# Patient Record
Sex: Female | Born: 1970 | Race: White | Hispanic: No | Marital: Married | Smoking: Current every day smoker
Health system: Southern US, Community
[De-identification: ages and names within clinical notes are randomized; demographics above are authoritative.]

## PROBLEM LIST (undated history)

## (undated) DIAGNOSIS — M5136 Other intervertebral disc degeneration, lumbar region: Secondary | ICD-10-CM

## (undated) DIAGNOSIS — R569 Unspecified convulsions: Secondary | ICD-10-CM

## (undated) DIAGNOSIS — T7840XA Allergy, unspecified, initial encounter: Secondary | ICD-10-CM

## (undated) DIAGNOSIS — J45909 Unspecified asthma, uncomplicated: Secondary | ICD-10-CM

## (undated) DIAGNOSIS — N83209 Unspecified ovarian cyst, unspecified side: Secondary | ICD-10-CM

## (undated) DIAGNOSIS — Z87442 Personal history of urinary calculi: Secondary | ICD-10-CM

## (undated) DIAGNOSIS — I499 Cardiac arrhythmia, unspecified: Secondary | ICD-10-CM

## (undated) DIAGNOSIS — F419 Anxiety disorder, unspecified: Secondary | ICD-10-CM

## (undated) DIAGNOSIS — M797 Fibromyalgia: Secondary | ICD-10-CM

## (undated) DIAGNOSIS — S4380XA Sprain of other specified parts of unspecified shoulder girdle, initial encounter: Secondary | ICD-10-CM

## (undated) DIAGNOSIS — M419 Scoliosis, unspecified: Secondary | ICD-10-CM

## (undated) DIAGNOSIS — M1990 Unspecified osteoarthritis, unspecified site: Secondary | ICD-10-CM

## (undated) DIAGNOSIS — R233 Spontaneous ecchymoses: Principal | ICD-10-CM

## (undated) DIAGNOSIS — M51369 Other intervertebral disc degeneration, lumbar region without mention of lumbar back pain or lower extremity pain: Secondary | ICD-10-CM

## (undated) DIAGNOSIS — R51 Headache: Secondary | ICD-10-CM

## (undated) DIAGNOSIS — C801 Malignant (primary) neoplasm, unspecified: Secondary | ICD-10-CM

## (undated) DIAGNOSIS — K219 Gastro-esophageal reflux disease without esophagitis: Secondary | ICD-10-CM

## (undated) DIAGNOSIS — G8929 Other chronic pain: Secondary | ICD-10-CM

## (undated) DIAGNOSIS — M549 Dorsalgia, unspecified: Secondary | ICD-10-CM

## (undated) HISTORY — DX: Allergy, unspecified, initial encounter: T78.40XA

## (undated) HISTORY — DX: Spontaneous ecchymoses: R23.3

## (undated) HISTORY — DX: Unspecified osteoarthritis, unspecified site: M19.90

## (undated) HISTORY — DX: Fibromyalgia: M79.7

## (undated) HISTORY — PX: NECK SURGERY: SHX720

## (undated) HISTORY — DX: Gastro-esophageal reflux disease without esophagitis: K21.9

## (undated) HISTORY — DX: Anxiety disorder, unspecified: F41.9

## (undated) HISTORY — DX: Other intervertebral disc degeneration, lumbar region: M51.36

## (undated) HISTORY — PX: OTHER SURGICAL HISTORY: SHX169

## (undated) HISTORY — DX: Other intervertebral disc degeneration, lumbar region without mention of lumbar back pain or lower extremity pain: M51.369

## (undated) HISTORY — PX: TUBAL LIGATION: SHX77

---
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
## 2002-11-29 ENCOUNTER — Encounter: Admission: RE | Admit: 2002-11-29 | Discharge: 2002-11-29 | Payer: Self-pay | Admitting: Family Medicine

## 2002-11-29 ENCOUNTER — Encounter: Payer: Self-pay | Admitting: Family Medicine

## 2007-03-24 ENCOUNTER — Other Ambulatory Visit: Admission: RE | Admit: 2007-03-24 | Discharge: 2007-03-24 | Payer: Self-pay | Admitting: Family Medicine

## 2009-04-04 ENCOUNTER — Ambulatory Visit: Payer: Self-pay | Admitting: Internal Medicine

## 2009-12-28 ENCOUNTER — Ambulatory Visit: Payer: Self-pay | Admitting: Pain Medicine

## 2010-01-10 ENCOUNTER — Ambulatory Visit: Payer: Self-pay | Admitting: Pain Medicine

## 2010-02-15 ENCOUNTER — Ambulatory Visit: Payer: Self-pay | Admitting: Pain Medicine

## 2010-02-28 ENCOUNTER — Ambulatory Visit: Payer: Self-pay | Admitting: Pain Medicine

## 2010-04-12 ENCOUNTER — Ambulatory Visit: Payer: Self-pay | Admitting: Pain Medicine

## 2010-04-16 ENCOUNTER — Ambulatory Visit: Payer: Self-pay | Admitting: Pain Medicine

## 2010-05-10 ENCOUNTER — Ambulatory Visit: Payer: Self-pay | Admitting: Pain Medicine

## 2010-06-14 ENCOUNTER — Ambulatory Visit: Payer: Self-pay | Admitting: Pain Medicine

## 2010-06-24 HISTORY — PX: ABDOMINAL HYSTERECTOMY: SHX81

## 2010-06-27 ENCOUNTER — Ambulatory Visit: Payer: Self-pay | Admitting: Pain Medicine

## 2010-07-02 ENCOUNTER — Ambulatory Visit: Payer: Self-pay | Admitting: Unknown Physician Specialty

## 2010-07-11 ENCOUNTER — Ambulatory Visit: Payer: Self-pay | Admitting: Unknown Physician Specialty

## 2010-07-14 LAB — PATHOLOGY REPORT

## 2010-07-31 ENCOUNTER — Ambulatory Visit: Payer: Self-pay | Admitting: Pain Medicine

## 2010-08-08 ENCOUNTER — Ambulatory Visit: Payer: Self-pay | Admitting: Pain Medicine

## 2010-08-20 ENCOUNTER — Emergency Department (HOSPITAL_COMMUNITY)
Admission: EM | Admit: 2010-08-20 | Discharge: 2010-08-20 | Disposition: A | Discharge status: Home / Self Care | Payer: 59 | Attending: Emergency Medicine | Admitting: Emergency Medicine

## 2010-08-20 DIAGNOSIS — R51 Headache: Secondary | ICD-10-CM | POA: Insufficient documentation

## 2010-08-20 DIAGNOSIS — H53149 Visual discomfort, unspecified: Secondary | ICD-10-CM | POA: Insufficient documentation

## 2010-08-20 DIAGNOSIS — J329 Chronic sinusitis, unspecified: Secondary | ICD-10-CM | POA: Insufficient documentation

## 2010-08-28 ENCOUNTER — Ambulatory Visit: Payer: Self-pay | Admitting: Pain Medicine

## 2010-09-05 ENCOUNTER — Ambulatory Visit: Payer: Self-pay | Admitting: Pain Medicine

## 2010-10-15 ENCOUNTER — Ambulatory Visit: Payer: Self-pay | Admitting: Pain Medicine

## 2010-10-22 ENCOUNTER — Ambulatory Visit: Payer: Self-pay | Admitting: Pain Medicine

## 2010-10-25 ENCOUNTER — Ambulatory Visit: Payer: Self-pay | Admitting: Pain Medicine

## 2010-11-27 ENCOUNTER — Ambulatory Visit: Payer: Self-pay | Admitting: Pain Medicine

## 2010-12-03 ENCOUNTER — Ambulatory Visit: Payer: Self-pay | Admitting: Pain Medicine

## 2010-12-20 ENCOUNTER — Ambulatory Visit: Payer: Self-pay | Admitting: Pain Medicine

## 2010-12-31 ENCOUNTER — Ambulatory Visit: Payer: Self-pay | Admitting: Pain Medicine

## 2011-01-16 ENCOUNTER — Ambulatory Visit: Payer: Self-pay | Admitting: Pain Medicine

## 2011-01-23 ENCOUNTER — Ambulatory Visit: Payer: Self-pay | Admitting: Pain Medicine

## 2011-02-21 ENCOUNTER — Ambulatory Visit: Payer: Self-pay | Admitting: Pain Medicine

## 2011-03-21 ENCOUNTER — Ambulatory Visit: Payer: Self-pay | Admitting: Pain Medicine

## 2011-03-25 ENCOUNTER — Ambulatory Visit: Payer: Self-pay | Admitting: Pain Medicine

## 2011-03-25 ENCOUNTER — Emergency Department (HOSPITAL_COMMUNITY)
Admission: EM | Admit: 2011-03-25 | Discharge: 2011-03-26 | Disposition: A | Discharge status: Home / Self Care | Payer: Self-pay | Attending: Emergency Medicine | Admitting: Emergency Medicine

## 2011-03-25 DIAGNOSIS — F29 Unspecified psychosis not due to a substance or known physiological condition: Secondary | ICD-10-CM | POA: Insufficient documentation

## 2011-03-25 DIAGNOSIS — R55 Syncope and collapse: Secondary | ICD-10-CM | POA: Insufficient documentation

## 2011-03-25 DIAGNOSIS — R32 Unspecified urinary incontinence: Secondary | ICD-10-CM | POA: Insufficient documentation

## 2011-03-25 DIAGNOSIS — F17200 Nicotine dependence, unspecified, uncomplicated: Secondary | ICD-10-CM | POA: Insufficient documentation

## 2011-03-25 DIAGNOSIS — R569 Unspecified convulsions: Secondary | ICD-10-CM | POA: Insufficient documentation

## 2011-03-25 DIAGNOSIS — Z79899 Other long term (current) drug therapy: Secondary | ICD-10-CM | POA: Insufficient documentation

## 2011-03-25 HISTORY — DX: Scoliosis, unspecified: M41.9

## 2011-03-25 NOTE — ED Notes (Signed)
Bystander describes pt had seizure activity at home lasting 4 minutes.   Pt awake, alert, oriented now,  Denies pain

## 2011-03-25 NOTE — ED Notes (Signed)
Alert, talking, no hx of sz.  No  Injury to tongue , no incontinence.  No recent MVC or other injury

## 2011-03-26 ENCOUNTER — Encounter (HOSPITAL_COMMUNITY): Payer: Self-pay | Admitting: *Deleted

## 2011-03-26 ENCOUNTER — Emergency Department (HOSPITAL_COMMUNITY): Payer: Self-pay

## 2011-03-26 LAB — BASIC METABOLIC PANEL
BUN: 5 mg/dL — ABNORMAL LOW (ref 6–23)
CO2: 26 mEq/L (ref 19–32)
Calcium: 8.5 mg/dL (ref 8.4–10.5)
Chloride: 104 mEq/L (ref 96–112)
GFR calc non Af Amer: >90 mL/min (ref >90)
Glucose, Bld: 101 mg/dL — ABNORMAL HIGH (ref 70–99)
Potassium: 3.7 mEq/L (ref 3.5–5.1)
Sodium: 137 mEq/L (ref 135–145)

## 2011-03-26 LAB — URINALYSIS, ROUTINE W REFLEX MICROSCOPIC
Bilirubin Urine: NEGATIVE
Glucose, UA: NEGATIVE mg/dL
Protein, ur: NEGATIVE mg/dL
Urobilinogen, UA: 0.2 mg/dL (ref 0.0–1.0)

## 2011-03-26 LAB — CBC
MCHC: 35.6 g/dL (ref 30.0–36.0)
Platelets: 169 10*3/uL (ref 150–400)
RDW: 12.5 % (ref 11.5–15.5)

## 2011-03-26 LAB — DIFFERENTIAL
Basophils Absolute: 0 10*3/uL (ref 0.0–0.1)
Basophils Relative: 0 % (ref 0–1)
Neutro Abs: 7.7 10*3/uL (ref 1.7–7.7)
Neutrophils Relative %: 88 % — ABNORMAL HIGH (ref 43–77)

## 2011-03-26 LAB — URINE MICROSCOPIC-ADD ON

## 2011-03-26 NOTE — ED Provider Notes (Signed)
History     CSN: [id number] Arrival date & time: 03/25/2011 11:19 PM  Chief Complaint  Patient presents with  . Seizures    (Consider location/radiation/quality/duration/timing/severity/associated sxs/prior treatment) HPI Comments: Seen 0013. Patient was sitting on the couch next to her husband. Her head deviated to the right and her body stiffened. Her husband prevented her from sliding off the couch. She began to foam at the mouth and had shaking of the entire body for ?4 minutes. When she awakened she was a little confused and her speech was not clear initially. Patient denies headache, aura, fever, chills. She did have a nerve block done yesterday morning at [facility] by Dr. Metta Clines. 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  Patient is a 40 y.o. female presenting with seizures. The history is provided by the patient and the spouse.  Seizures  This is a new problem. The current episode started less than 1 hour ago. The problem has been resolved. There was 1 seizure. The most recent episode lasted 2 to 5 minutes. Associated symptoms include confusion. Pertinent negatives include no sleepiness, no headaches, no speech difficulty, no visual disturbance, no neck stiffness, no sore throat, no chest pain, no cough, no nausea, no vomiting, no diarrhea and no muscle weakness. Characteristics include bowel incontinence, rhythmic jerking and loss of consciousness. Characteristics do not include eye blinking, eye deviation, bladder incontinence, bit tongue, apnea or cyanosis. The episode was witnessed. There was no sensation of an aura present. The seizures did not continue in the ED. The seizure(s) had no focality. Patient had a nerve block done yesterday for chronic pain in the neck. There has been no fever. There were no medications administered prior to arrival.    Past Medical History  Diagnosis Date  . Scoliosis     Past Surgical History  Procedure Date  .  Abdominal hysterectomy     History reviewed. No pertinent family history.  History  Substance Use Topics  . Smoking status: Current Everyday Smoker  . Smokeless tobacco: Not on file  . Alcohol Use: No    OB History    Grav Para Term Preterm Abortions TAB SAB Ect Mult Living                  Review of Systems  HENT: Negative for sore throat.   Eyes: Negative for visual disturbance.  Respiratory: Negative for apnea and cough.   Cardiovascular: Negative for chest pain and cyanosis.  Gastrointestinal: Positive for bowel incontinence. Negative for nausea, vomiting and diarrhea.  Genitourinary: Negative for bladder incontinence.  Neurological: Positive for seizures and loss of consciousness. Negative for speech difficulty and headaches.  Psychiatric/Behavioral: Positive for confusion.  All other systems reviewed and are negative.    Allergies  Review of patient's allergies indicates no known allergies.  Home Medications   Current Outpatient Rx  Name Route Sig Dispense Refill  . AMITRIPTYLINE HCL 10 MG PO TABS Oral Take 10 mg by mouth at bedtime.      . BUSPIRONE HCL 30 MG PO TABS Oral Take 15 mg by mouth 2 (two) times daily.      Marland Kitchen CITALOPRAM HYDROBROMIDE 40 MG PO TABS Oral Take 40 mg by mouth daily.      Marland Kitchen GABAPENTIN 300 MG PO CAPS Oral Take 300 mg by mouth 3 (three) times daily.      . TRAMADOL HCL 50 MG PO TABS Oral Take 50 mg by mouth every  6 (six) hours as needed.        BP 115/69  Pulse 85  Temp(Src) 98.4 F (36.9 C) (Oral)  Resp 18  Ht 5\' 1"  (1.549 m)  Wt 100 lb (45.36 kg)  BMI 18.89 kg/m2  SpO2 95%  Physical Exam  Nursing note and vitals reviewed. Constitutional: She is oriented to person, place, and time. She appears well-developed and well-nourished.  HENT:  Head: Normocephalic and atraumatic.  Right Ear: External ear normal.  Left Ear: External ear normal.  Mouth/Throat: Oropharynx is clear and moist.  Eyes: EOM are normal. Pupils are equal, round,  and reactive to light.  Neck: Normal range of motion. Neck supple.  Cardiovascular: Normal rate, normal heart sounds and intact distal pulses.   Pulmonary/Chest: Effort normal and breath sounds normal.  Abdominal: Soft. Bowel sounds are normal.  Musculoskeletal: Normal range of motion.  Neurological: She is alert and oriented to person, place, and time. She has normal reflexes. No cranial nerve deficit. Coordination normal.       Speech normal.No focal neurological deficits.  Skin: Skin is warm and dry.    ED Course  Procedures (including critical care time)  Labs Reviewed  CBC - Abnormal; Notable for the following:    RBC 5.28 (*)    Hemoglobin 17.1 (*)    HCT 48.0 (*)    All other components within normal limits  DIFFERENTIAL - Abnormal; Notable for the following:    Neutrophils Relative 88 (*)    Lymphocytes Relative 6 (*)    Lymphs Abs 0.6 (*)    All other components within normal limits  BASIC METABOLIC PANEL - Abnormal; Notable for the following:    Glucose, Bld 101 (*)    BUN 5 (*)    All other components within normal limits  URINALYSIS, ROUTINE W REFLEX MICROSCOPIC - Abnormal; Notable for the following:    Color, Urine STRAW (*)    Hgb urine dipstick TRACE (*)    All other components within normal limits  URINE MICROSCOPIC-ADD ON - Abnormal; Notable for the following:    Squamous Epithelial / LPF MANY (*)    All other components within normal limits   Ct Head Wo Contrast  03/26/2011  *RADIOLOGY REPORT*  Clinical Data: Seizures.  Migraine today.  CT HEAD WITHOUT CONTRAST  Technique:  Contiguous axial images were obtained from the base of the skull through the vertex without contrast.  Comparison: None.  Findings: The ventricles and sulci appear symmetrical.  No mass effect or midline shift.  No abnormal extra-axial fluid collections.  Gray-white matter junctions are distinct.  Basal cisterns are not effaced.  No evidence of acute intracranial hemorrhage.  No depressed  skull fractures.  Visualized paranasal sinuses are not opacified.  IMPRESSION: No evidence of acute intracranial hemorrhage, mass lesion, or acute infarct.  Original Report Authenticated By: Marlon Pel, M.D.    Patient with first time seizure. Labs unremarkable CT head normal. Discussed with patient and husband no intervention for first seizure. Will give referral to neurology. Advised that should she develop further seizures, medication may be started.Pt stable in ED with no significant deterioration in condition. MDM Reviewed: nursing note and vitals Interpretation: labs and CT scan           Nicoletta Dress. Colon Branch, MD 03/26/11 [phone]

## 2011-03-26 NOTE — ED Notes (Signed)
Sleeping, family at bedside 

## 2011-03-26 NOTE — ED Notes (Signed)
Saline lock had been in lt ac placed by EMS.  REmoved with cath intact.

## 2011-04-09 ENCOUNTER — Other Ambulatory Visit: Payer: Self-pay | Admitting: Neurology

## 2011-04-09 DIAGNOSIS — R569 Unspecified convulsions: Secondary | ICD-10-CM

## 2011-04-09 DIAGNOSIS — Z8669 Personal history of other diseases of the nervous system and sense organs: Secondary | ICD-10-CM

## 2011-04-18 ENCOUNTER — Ambulatory Visit: Payer: Self-pay | Admitting: Pain Medicine

## 2011-04-19 ENCOUNTER — Ambulatory Visit (HOSPITAL_COMMUNITY)
Admission: RE | Admit: 2011-04-19 | Discharge: 2011-04-19 | Disposition: A | Discharge status: Home / Self Care | Payer: 59 | Source: Ambulatory Visit | Attending: Neurology | Admitting: Neurology

## 2011-04-19 DIAGNOSIS — Z8669 Personal history of other diseases of the nervous system and sense organs: Secondary | ICD-10-CM

## 2011-04-19 DIAGNOSIS — Q8500 Neurofibromatosis, unspecified: Secondary | ICD-10-CM

## 2011-04-19 DIAGNOSIS — G9389 Other specified disorders of brain: Secondary | ICD-10-CM | POA: Insufficient documentation

## 2011-04-19 DIAGNOSIS — R569 Unspecified convulsions: Secondary | ICD-10-CM

## 2011-04-19 MED ORDER — GADOBENATE DIMEGLUMINE 529 MG/ML IV SOLN
10.0000 mL | Freq: Once | INTRAVENOUS | Status: AC | PRN
  Administered 2011-04-19: 10 mL via INTRAVENOUS

## 2011-05-21 ENCOUNTER — Ambulatory Visit: Payer: Self-pay | Admitting: Pain Medicine

## 2011-06-10 ENCOUNTER — Ambulatory Visit: Payer: Self-pay | Admitting: Pain Medicine

## 2011-06-21 ENCOUNTER — Encounter (HOSPITAL_COMMUNITY): Payer: Self-pay | Admitting: *Deleted

## 2011-06-21 ENCOUNTER — Emergency Department (HOSPITAL_COMMUNITY)
Admission: EM | Admit: 2011-06-21 | Discharge: 2011-06-22 | Disposition: A | Discharge status: Home / Self Care | Payer: 59 | Attending: Emergency Medicine | Admitting: Emergency Medicine

## 2011-06-21 DIAGNOSIS — N83209 Unspecified ovarian cyst, unspecified side: Secondary | ICD-10-CM

## 2011-06-21 DIAGNOSIS — M25559 Pain in unspecified hip: Secondary | ICD-10-CM | POA: Insufficient documentation

## 2011-06-21 DIAGNOSIS — Z79899 Other long term (current) drug therapy: Secondary | ICD-10-CM | POA: Insufficient documentation

## 2011-06-21 DIAGNOSIS — G40909 Epilepsy, unspecified, not intractable, without status epilepticus: Secondary | ICD-10-CM | POA: Insufficient documentation

## 2011-06-21 DIAGNOSIS — N949 Unspecified condition associated with female genital organs and menstrual cycle: Secondary | ICD-10-CM | POA: Insufficient documentation

## 2011-06-21 DIAGNOSIS — R300 Dysuria: Secondary | ICD-10-CM | POA: Insufficient documentation

## 2011-06-21 DIAGNOSIS — M549 Dorsalgia, unspecified: Secondary | ICD-10-CM | POA: Insufficient documentation

## 2011-06-21 DIAGNOSIS — G8929 Other chronic pain: Secondary | ICD-10-CM | POA: Insufficient documentation

## 2011-06-21 HISTORY — DX: Unspecified convulsions: R56.9

## 2011-06-21 HISTORY — DX: Other chronic pain: G89.29

## 2011-06-21 HISTORY — DX: Dorsalgia, unspecified: M54.9

## 2011-06-21 NOTE — ED Notes (Signed)
Pt has had LLQ pain that started tonight.  Urination decreased, reports pain with urination.  Back pain.

## 2011-06-22 ENCOUNTER — Emergency Department (HOSPITAL_COMMUNITY): Payer: 59

## 2011-06-22 LAB — PREGNANCY, URINE: Preg Test, Ur: NEGATIVE

## 2011-06-22 LAB — CBC
MCHC: 35.5 g/dL (ref 30.0–36.0)
Platelets: 210 10*3/uL (ref 150–400)
RDW: 12.6 % (ref 11.5–15.5)
WBC: 6.2 10*3/uL (ref 4.0–10.5)

## 2011-06-22 LAB — URINALYSIS, ROUTINE W REFLEX MICROSCOPIC
Hgb urine dipstick: NEGATIVE
Leukocytes, UA: NEGATIVE
Protein, ur: NEGATIVE mg/dL
Specific Gravity, Urine: 1.02 (ref 1.005–1.030)
Urobilinogen, UA: 0.2 mg/dL (ref 0.0–1.0)

## 2011-06-22 LAB — BASIC METABOLIC PANEL
Chloride: 102 mEq/L (ref 96–112)
GFR calc Af Amer: >90 mL/min (ref >90)
GFR calc non Af Amer: >90 mL/min (ref >90)
Potassium: 3.7 mEq/L (ref 3.5–5.1)
Sodium: 135 mEq/L (ref 135–145)

## 2011-06-22 MED ORDER — OXYCODONE-ACETAMINOPHEN 5-325 MG PO TABS: 2.0000 | ORAL_TABLET | ORAL | Status: AC | PRN

## 2011-06-22 MED ORDER — ONDANSETRON HCL 4 MG/2ML IJ SOLN
4.0000 mg | Freq: Once | INTRAMUSCULAR | Status: AC
  Administered 2011-06-22: 4 mg via INTRAVENOUS
  Filled 2011-06-22: qty 2

## 2011-06-22 MED ORDER — FENTANYL CITRATE 0.05 MG/ML IJ SOLN
25.0000 ug | Freq: Once | INTRAMUSCULAR | Status: AC
  Administered 2011-06-22: 25 ug via INTRAVENOUS
  Filled 2011-06-22: qty 2

## 2011-06-22 MED ORDER — SODIUM CHLORIDE 0.9 % IV BOLUS (SEPSIS)
1000.0000 mL | Freq: Once | INTRAVENOUS | Status: AC
  Administered 2011-06-22: 1000 mL via INTRAVENOUS

## 2011-06-22 NOTE — ED Provider Notes (Signed)
History     CSN: [id number]  Arrival date & time 06/21/11  2152   First MD Initiated Contact with Patient 06/22/11 0227      Chief Complaint  Patient presents with  . Hip Pain    (Consider location/radiation/quality/duration/timing/severity/associated sxs/prior treatment) Patient is a 40 y.o. female presenting with hip pain.  Hip Pain This is a recurrent problem. Episode onset: About 4 weeks ago. The problem occurs daily. The problem has been gradually worsening. Pertinent negatives include no chest pain, no abdominal pain, no headaches and no shortness of breath. The symptoms are aggravated by nothing. The symptoms are relieved by nothing.   sharp in quality. No radiation of pain. No vaginal bleeding or discharge. Patient has had a hysterectomy. Pain is located left pelvic region. She states it is getting worse comes in waves. She takes hydrocodone home with minimal relief. She has some dysuria but no urgency or frequency. No hematuria. No trauma. No rash.  Past Medical History  Diagnosis Date  . Scoliosis   . Seizures   . Chronic back pain     Past Surgical History  Procedure Date  . Abdominal hysterectomy     History reviewed. No pertinent family history.  History  Substance Use Topics  . Smoking status: Current Everyday Smoker -- 1.0 packs/day for 20 years  . Smokeless tobacco: Not on file  . Alcohol Use: No    OB History    Grav Para Term Preterm Abortions TAB SAB Ect Mult Living                  Review of Systems  Constitutional: Negative for fever and chills.  HENT: Negative for neck pain and neck stiffness.   Eyes: Negative for pain.  Respiratory: Negative for shortness of breath.   Cardiovascular: Negative for chest pain.  Gastrointestinal: Negative for abdominal pain.  Genitourinary: Positive for dysuria and pelvic pain. Negative for flank pain.  Musculoskeletal: Negative for back pain.  Skin: Negative for rash.  Neurological: Negative for  headaches.  All other systems reviewed and are negative.    Allergies  Tramadol  Home Medications   Current Outpatient Rx  Name Route Sig Dispense Refill  . AMITRIPTYLINE HCL 10 MG PO TABS Oral Take 30 mg by mouth at bedtime.     . BUSPIRONE HCL 30 MG PO TABS Oral Take 15 mg by mouth 2 (two) times daily.      Marland Kitchen CITALOPRAM HYDROBROMIDE 40 MG PO TABS Oral Take 40 mg by mouth daily.      Marland Kitchen GABAPENTIN 300 MG PO CAPS Oral Take 300 mg by mouth 2 (two) times daily.     Marland Kitchen HYDROCODONE-ACETAMINOPHEN 5-325 MG PO TABS Oral Take 1 tablet by mouth every 6 (six) hours as needed. For pain     . LEVETIRACETAM 500 MG PO TABS Oral Take 750 mg by mouth 2 (two) times daily.        BP 111/69  Pulse 77  Temp(Src) 98.4 F (36.9 C) (Oral)  Resp 16  SpO2 98%  Physical Exam  Constitutional: She is oriented to person, place, and time. She appears well-developed and well-nourished.  HENT:  Head: Normocephalic and atraumatic.  Eyes: Conjunctivae and EOM are normal. Pupils are equal, round, and reactive to light.  Neck: Trachea normal. Neck supple. No thyromegaly present.  Cardiovascular: Normal rate, regular rhythm, S1 normal, S2 normal and normal pulses.     No systolic murmur is present   No diastolic murmur is  present  Pulses:      Radial pulses are 2+ on the right side, and 2+ on the left side.  Pulmonary/Chest: Effort normal and breath sounds normal. She has no wheezes. She has no rhonchi. She has no rales. She exhibits no tenderness.  Abdominal: Soft. Normal appearance and bowel sounds are normal. She exhibits no mass. There is no rebound, no guarding, no CVA tenderness and negative Murphy's sign.  Genitourinary:       Tender left pelvic region. No midline tenderness. No right lower quadrant tenderness. No peritonitis.  Musculoskeletal:       BLE:s Calves nontender, no cords or erythema, negative Homans sign  Neurological: She is alert and oriented to person, place, and time. She has normal  strength. No cranial nerve deficit or sensory deficit. GCS eye subscore is 4. GCS verbal subscore is 5. GCS motor subscore is 6.  Skin: Skin is warm and dry. No rash noted. She is not diaphoretic.  Psychiatric: Her speech is normal.       Cooperative and appropriate    ED Course  Procedures (including critical care time)   Labs Reviewed  URINALYSIS, ROUTINE W REFLEX MICROSCOPIC  PREGNANCY, URINE  CBC  BASIC METABOLIC PANEL   US Transvaginal Non-ob  06/22/2011  *RADIOLOGY REPORT*  Clinical Data: Left pelvic pain.  History of uterine cancer status post partial hysterectomy.  TRANSABDOMINAL AND TRANSVAGINAL ULTRASOUND OF PELVIS Technique:  Both transabdominal and transvaginal ultrasound examinations of the pelvis were performed. Transabdominal technique was performed for global imaging of the pelvis including uterus, ovaries, adnexal regions, and pelvic cul-de-sac.  Comparison: None.   It was necessary to proceed with endovaginal exam following the transabdominal exam to visualize the pelvic organs.  Findings:  Uterus: Postop hysterectomy.  No residual uterine tissue demonstrated.  Endometrium: Surgically absent  Right ovary:  The right ovary measures 2.8 x 1.4 x 1.2 cm.  Normal follicular changes demonstrated.  No abnormal adnexal masses.  Flow demonstrated in the right ovary on limited color flow Doppler image.  Left ovary: The left ovary measures 3.1 x 2 x 1.7 cm.  A complex cyst is demonstrated measuring about 1.1 cm diameter.  This may represent a hemorrhagic cyst.  Flow is demonstrated within the left ovary but not within the cyst on color flow Doppler image.  Other findings: Small amount of free fluid.  IMPRESSION: Surgical absence of the uterus.  Small amount of free fluid.  Small complex cyst in the left ovary likely representing a hemorrhagic cyst.  Original Report Authenticated By: Marlon Pel, M.D.   US Pelvis Complete  06/22/2011  *RADIOLOGY REPORT*  Clinical Data: Left pelvic  pain.  History of uterine cancer status post partial hysterectomy.  TRANSABDOMINAL AND TRANSVAGINAL ULTRASOUND OF PELVIS Technique:  Both transabdominal and transvaginal ultrasound examinations of the pelvis were performed. Transabdominal technique was performed for global imaging of the pelvis including uterus, ovaries, adnexal regions, and pelvic cul-de-sac.  Comparison: None.   It was necessary to proceed with endovaginal exam following the transabdominal exam to visualize the pelvic organs.  Findings:  Uterus: Postop hysterectomy.  No residual uterine tissue demonstrated.  Endometrium: Surgically absent  Right ovary:  The right ovary measures 2.8 x 1.4 x 1.2 cm.  Normal follicular changes demonstrated.  No abnormal adnexal masses.  Flow demonstrated in the right ovary on limited color flow Doppler image.  Left ovary: The left ovary measures 3.1 x 2 x 1.7 cm.  A complex cyst is demonstrated measuring  about 1.1 cm diameter.  This may represent a hemorrhagic cyst.  Flow is demonstrated within the left ovary but not within the cyst on color flow Doppler image.  Other findings: Small amount of free fluid.  IMPRESSION: Surgical absence of the uterus.  Small amount of free fluid.  Small complex cyst in the left ovary likely representing a hemorrhagic cyst.  Original Report Authenticated By: Marlon Pel, M.D.    Pain medications provided.   MDM   Pelvic pain with ultrasound that demonstrates likely hemorrhagic cyst. Clinical presentation consistent with the same. Pain medications provided and OB/GYN referral. Patient stable for discharge home.        Sunnie Nielsen, MD 06/22/11 [phone]

## 2011-06-22 NOTE — ED Notes (Signed)
Pt ambulated with a steady gait; VSS; A&Ox4; no signs of distress; skin warm and dry; respirations even and unlabored.

## 2011-06-22 NOTE — ED Notes (Signed)
POC explained. Pt waiting for [place].

## 2011-06-22 NOTE — ED Notes (Signed)
Patient transported to Ultrasound 

## 2011-06-22 NOTE — ED Notes (Signed)
PT reports pelvic pain and right hip pain. Full ROM. No signs of distress. Pt reports she just started the pelvic pain. 4/10 currently in pelvic region.

## 2011-06-22 NOTE — ED Notes (Signed)
Pt ambulated to restroom with no signs of distress.  

## 2011-06-22 NOTE — ED Notes (Signed)
Pt returned from US. No signs of distress.

## 2011-07-15 ENCOUNTER — Ambulatory Visit: Payer: Self-pay | Admitting: Pain Medicine

## 2011-08-12 ENCOUNTER — Ambulatory Visit: Payer: Self-pay | Admitting: Pain Medicine

## 2011-08-23 ENCOUNTER — Ambulatory Visit: Payer: Self-pay | Admitting: Pain Medicine

## 2011-09-12 ENCOUNTER — Ambulatory Visit: Payer: Self-pay | Admitting: Pain Medicine

## 2011-09-30 ENCOUNTER — Ambulatory Visit: Payer: Self-pay | Admitting: Pain Medicine

## 2011-10-28 ENCOUNTER — Ambulatory Visit: Payer: Self-pay | Admitting: Pain Medicine

## 2011-11-28 ENCOUNTER — Ambulatory Visit: Payer: Self-pay | Admitting: Pain Medicine

## 2011-12-09 ENCOUNTER — Ambulatory Visit: Payer: Self-pay | Admitting: Pain Medicine

## 2011-12-24 ENCOUNTER — Encounter (HOSPITAL_BASED_OUTPATIENT_CLINIC_OR_DEPARTMENT_OTHER): Payer: Self-pay | Admitting: *Deleted

## 2011-12-24 NOTE — Progress Notes (Signed)
Says seizure disorder-small "starring" seizures No labs needed To bring all meds and overnight bag

## 2011-12-27 ENCOUNTER — Encounter (HOSPITAL_BASED_OUTPATIENT_CLINIC_OR_DEPARTMENT_OTHER): Payer: Self-pay | Admitting: *Deleted

## 2011-12-27 ENCOUNTER — Ambulatory Visit (HOSPITAL_BASED_OUTPATIENT_CLINIC_OR_DEPARTMENT_OTHER)
Admission: RE | Admit: 2011-12-27 | Discharge: 2011-12-27 | Disposition: A | Discharge status: Home / Self Care | Payer: 59 | Source: Ambulatory Visit | Attending: Orthopedic Surgery | Admitting: Orthopedic Surgery

## 2011-12-27 ENCOUNTER — Ambulatory Visit (HOSPITAL_BASED_OUTPATIENT_CLINIC_OR_DEPARTMENT_OTHER): Payer: 59 | Admitting: *Deleted

## 2011-12-27 ENCOUNTER — Encounter (HOSPITAL_BASED_OUTPATIENT_CLINIC_OR_DEPARTMENT_OTHER)
Admission: RE | Disposition: A | Discharge status: Home / Self Care | Payer: Self-pay | Source: Ambulatory Visit | Attending: Orthopedic Surgery

## 2011-12-27 ENCOUNTER — Encounter (HOSPITAL_BASED_OUTPATIENT_CLINIC_OR_DEPARTMENT_OTHER): Payer: Self-pay | Admitting: Orthopedic Surgery

## 2011-12-27 DIAGNOSIS — S46819A Strain of other muscles, fascia and tendons at shoulder and upper arm level, unspecified arm, initial encounter: Secondary | ICD-10-CM | POA: Diagnosis present

## 2011-12-27 DIAGNOSIS — M719 Bursopathy, unspecified: Secondary | ICD-10-CM | POA: Insufficient documentation

## 2011-12-27 DIAGNOSIS — M67919 Unspecified disorder of synovium and tendon, unspecified shoulder: Secondary | ICD-10-CM | POA: Insufficient documentation

## 2011-12-27 DIAGNOSIS — M549 Dorsalgia, unspecified: Secondary | ICD-10-CM | POA: Insufficient documentation

## 2011-12-27 DIAGNOSIS — M4120 Other idiopathic scoliosis, site unspecified: Secondary | ICD-10-CM | POA: Insufficient documentation

## 2011-12-27 DIAGNOSIS — R51 Headache: Secondary | ICD-10-CM | POA: Insufficient documentation

## 2011-12-27 DIAGNOSIS — G8929 Other chronic pain: Secondary | ICD-10-CM | POA: Insufficient documentation

## 2011-12-27 DIAGNOSIS — S4380XA Sprain of other specified parts of unspecified shoulder girdle, initial encounter: Secondary | ICD-10-CM | POA: Diagnosis present

## 2011-12-27 HISTORY — DX: Sprain of other specified parts of unspecified shoulder girdle, initial encounter: S43.80XA

## 2011-12-27 HISTORY — DX: Headache: R51

## 2011-12-27 HISTORY — DX: Strain of other muscles, fascia and tendons at shoulder and upper arm level, unspecified arm, initial encounter: S46.819A

## 2011-12-27 LAB — POCT HEMOGLOBIN-HEMACUE
Hemoglobin: 13 g/dL (ref 12.0–15.0)
Hemoglobin: 8.8 g/dL — ABNORMAL LOW (ref 12.0–15.0)

## 2011-12-27 SURGERY — SHOULDER ARTHROSCOPY WITH SUBACROMIAL DECOMPRESSION, ROTATOR CUFF REPAIR AND BICEP TENDON REPAIR
Anesthesia: General | Site: Shoulder | Laterality: Left | Wound class: Clean

## 2011-12-27 MED ORDER — DEXAMETHASONE SODIUM PHOSPHATE 4 MG/ML IJ SOLN
INTRAMUSCULAR | Status: DC | PRN
  Administered 2011-12-27: 10 mg via INTRAVENOUS

## 2011-12-27 MED ORDER — SUCCINYLCHOLINE CHLORIDE 20 MG/ML IJ SOLN
INTRAMUSCULAR | Status: DC | PRN
  Administered 2011-12-27: 100 mg via INTRAVENOUS

## 2011-12-27 MED ORDER — METHOCARBAMOL 500 MG PO TABS: 500.0000 mg | ORAL_TABLET | Freq: Four times a day (QID) | ORAL | Status: AC

## 2011-12-27 MED ORDER — OXYCODONE HCL 5 MG PO TABS
5.0000 mg | ORAL_TABLET | Freq: Once | ORAL | Status: AC | PRN
  Administered 2011-12-27: 5 mg via ORAL

## 2011-12-27 MED ORDER — PROPOFOL 10 MG/ML IV EMUL
INTRAVENOUS | Status: DC | PRN
  Administered 2011-12-27: 200 mg via INTRAVENOUS

## 2011-12-27 MED ORDER — LIDOCAINE HCL 4 % MT SOLN
OROMUCOSAL | Status: DC | PRN
  Administered 2011-12-27: 2 mL via TOPICAL

## 2011-12-27 MED ORDER — ROPIVACAINE HCL 5 MG/ML IJ SOLN
INTRAMUSCULAR | Status: DC | PRN
  Administered 2011-12-27: 7 mL via EPIDURAL

## 2011-12-27 MED ORDER — LIDOCAINE HCL (CARDIAC) 20 MG/ML IV SOLN
INTRAVENOUS | Status: DC | PRN
  Administered 2011-12-27: 40 mg via INTRAVENOUS

## 2011-12-27 MED ORDER — ONDANSETRON HCL 4 MG/2ML IJ SOLN
INTRAMUSCULAR | Status: DC | PRN
  Administered 2011-12-27: 4 mg via INTRAVENOUS

## 2011-12-27 MED ORDER — LACTATED RINGERS IV SOLN
INTRAVENOUS | Status: DC
  Administered 2011-12-27 (×2): via INTRAVENOUS

## 2011-12-27 MED ORDER — SODIUM CHLORIDE 0.9 % IR SOLN
Status: DC | PRN
  Administered 2011-12-27: 3000 mL

## 2011-12-27 MED ORDER — METOCLOPRAMIDE HCL 5 MG/ML IJ SOLN: 10.0000 mg | Freq: Once | INTRAMUSCULAR | Status: DC | PRN

## 2011-12-27 MED ORDER — LIDOCAINE HCL 1 % IJ SOLN
INTRAMUSCULAR | Status: DC | PRN
  Administered 2011-12-27: 2 mL via INTRADERMAL

## 2011-12-27 MED ORDER — MIDAZOLAM HCL 2 MG/2ML IJ SOLN
0.5000 mg | INTRAMUSCULAR | Status: DC | PRN
  Administered 2011-12-27: 2 mg via INTRAVENOUS

## 2011-12-27 MED ORDER — FENTANYL CITRATE 0.05 MG/ML IJ SOLN
50.0000 ug | INTRAMUSCULAR | Status: DC | PRN
  Administered 2011-12-27: 100 ug via INTRAVENOUS

## 2011-12-27 MED ORDER — OXYCODONE-ACETAMINOPHEN 10-325 MG PO TABS: 1.0000 | ORAL_TABLET | Freq: Four times a day (QID) | ORAL | Status: AC | PRN

## 2011-12-27 MED ORDER — PROPOFOL 10 MG/ML IV EMUL: INTRAVENOUS | Status: DC | PRN

## 2011-12-27 MED ORDER — FENTANYL CITRATE 0.05 MG/ML IJ SOLN
25.0000 ug | INTRAMUSCULAR | Status: DC | PRN
  Administered 2011-12-27: 50 ug via INTRAVENOUS

## 2011-12-27 MED ORDER — CEFAZOLIN SODIUM 1-5 GM-% IV SOLN
INTRAVENOUS | Status: DC | PRN
  Administered 2011-12-27: 1 g via INTRAVENOUS

## 2011-12-27 MED ORDER — PROMETHAZINE HCL 25 MG PO TABS: 25.0000 mg | ORAL_TABLET | Freq: Four times a day (QID) | ORAL | Status: DC | PRN

## 2011-12-27 SURGICAL SUPPLY — 77 items
ANCHOR SUT BIO SW 4.75X19.1 (Anchor) ×4 IMPLANT
BLADE 4.2CUDA (BLADE) IMPLANT
BLADE CUDA GRT WHITE 3.5 (BLADE) IMPLANT
BLADE CUTTER GATOR 3.5 (BLADE) ×2 IMPLANT
BLADE GREAT WHITE 4.2 (BLADE) IMPLANT
BLADE SURG 15 STRL LF DISP TIS (BLADE) ×1 IMPLANT
BLADE SURG 15 STRL SS (BLADE) ×1
BUR OVAL 4.0 (BURR) ×2 IMPLANT
BUR OVAL 6.0 (BURR) IMPLANT
CANISTER OMNI JUG 16 LITER (MISCELLANEOUS) ×2 IMPLANT
CANNULA 5.75X71 LONG (CANNULA) ×2 IMPLANT
CANNULA TWIST IN 8.25X7CM (CANNULA) ×2 IMPLANT
CANNULA TWIST IN 8.25X9CM (CANNULA) IMPLANT
CLOTH BEACON ORANGE TIMEOUT ST (SAFETY) ×2 IMPLANT
CUTTER MENISCUS  4.2MM (BLADE)
CUTTER MENISCUS 4.2MM (BLADE) IMPLANT
DECANTER SPIKE VIAL GLASS SM (MISCELLANEOUS) IMPLANT
DRAPE INCISE IOBAN 66X45 STRL (DRAPES) ×2 IMPLANT
DRAPE SHOULDER BEACH CHAIR (DRAPES) ×2 IMPLANT
DRAPE U 20/CS (DRAPES) ×2 IMPLANT
DRAPE U-SHAPE 47X51 STRL (DRAPES) ×2 IMPLANT
DRSG PAD ABDOMINAL 8X10 ST (GAUZE/BANDAGES/DRESSINGS) ×2 IMPLANT
DURAPREP 26ML APPLICATOR (WOUND CARE) ×2 IMPLANT
ELECT REM PT RETURN 9FT ADLT (ELECTROSURGICAL) ×2
ELECTRODE REM PT RTRN 9FT ADLT (ELECTROSURGICAL) ×1 IMPLANT
FIBERSTICK 2 (SUTURE) IMPLANT
GLOVE BIO SURGEON STRL SZ 6.5 (GLOVE) ×2 IMPLANT
GLOVE BIO SURGEON STRL SZ8 (GLOVE) ×2 IMPLANT
GLOVE BIOGEL PI IND STRL 8 (GLOVE) ×2 IMPLANT
GLOVE BIOGEL PI INDICATOR 8 (GLOVE) ×2
GLOVE INDICATOR 7.0 STRL GRN (GLOVE) ×2 IMPLANT
GLOVE ORTHO TXT STRL SZ7.5 (GLOVE) ×2 IMPLANT
GOWN PREVENTION PLUS XLARGE (GOWN DISPOSABLE) ×4 IMPLANT
GOWN PREVENTION PLUS XXLARGE (GOWN DISPOSABLE) ×2 IMPLANT
IMMOBILIZER SHOULDER XLGE (ORTHOPEDIC SUPPLIES) IMPLANT
IV NS IRRIG 3000ML ARTHROMATIC (IV SOLUTION) ×12 IMPLANT
KIT BIO-TENODESIS 3X8 DISP (MISCELLANEOUS)
KIT INSRT BABSR STRL DISP BTN (MISCELLANEOUS) IMPLANT
KIT SHOULDER TRACTION (DRAPES) ×2 IMPLANT
LASSO SUT 90 DEGREE (SUTURE) IMPLANT
NDL SUT 6 .5 CRC .975X.05 MAYO (NEEDLE) IMPLANT
NEEDLE MAYO TAPER (NEEDLE)
PACK ARTHROSCOPY DSU (CUSTOM PROCEDURE TRAY) ×2 IMPLANT
PACK BASIN DAY SURGERY FS (CUSTOM PROCEDURE TRAY) ×2 IMPLANT
PENCIL BUTTON HOLSTER BLD 10FT (ELECTRODE) ×2 IMPLANT
SET ARTHROSCOPY TUBING (MISCELLANEOUS) ×1
SET ARTHROSCOPY TUBING LN (MISCELLANEOUS) ×1 IMPLANT
SHEET MEDIUM DRAPE 40X70 STRL (DRAPES) ×2 IMPLANT
SLEEVE SCD COMPRESS KNEE MED (MISCELLANEOUS) ×2 IMPLANT
SLING ARM FOAM STRAP LRG (SOFTGOODS) IMPLANT
SLING ARM FOAM STRAP MED (SOFTGOODS) ×2 IMPLANT
SLING ARM FOAM STRAP XLG (SOFTGOODS) IMPLANT
SLING ARM IMMOBILIZER LRG (SOFTGOODS) IMPLANT
SLING ARM IMMOBILIZER MED (SOFTGOODS) IMPLANT
SPONGE GAUZE 4X4 12PLY (GAUZE/BANDAGES/DRESSINGS) ×2 IMPLANT
SPONGE LAP 4X18 X RAY DECT (DISPOSABLE) ×2 IMPLANT
STRIP CLOSURE SKIN 1/2X4 (GAUZE/BANDAGES/DRESSINGS) ×2 IMPLANT
SUT FIBERWIRE #2 38 T-5 BLUE (SUTURE) ×2
SUT LASSO 45 DEGREE (SUTURE) ×2 IMPLANT
SUT LASSO 45 DEGREE LEFT (SUTURE) IMPLANT
SUT LASSO 45D RIGHT (SUTURE) IMPLANT
SUT MNCRL AB 4-0 PS2 18 (SUTURE) ×2 IMPLANT
SUT PDS AB 1 CT  36 (SUTURE)
SUT PDS AB 1 CT 36 (SUTURE) IMPLANT
SUT PROLENE 0 CT 1 CR/8 (SUTURE) IMPLANT
SUT TIGER TAPE 7 IN WHITE (SUTURE) ×2 IMPLANT
SUT VIC AB 3-0 SH 27 (SUTURE)
SUT VIC AB 3-0 SH 27X BRD (SUTURE) IMPLANT
SUT VICRYL 3-0 CR8 SH (SUTURE) ×2 IMPLANT
SUTURE FIBERWR #2 38 T-5 BLUE (SUTURE) ×1 IMPLANT
TAPE FIBER 2MM 7IN #2 BLUE (SUTURE) ×2 IMPLANT
TOWEL OR 17X24 6PK STRL BLUE (TOWEL DISPOSABLE) ×2 IMPLANT
TOWEL OR NON WOVEN STRL DISP B (DISPOSABLE) ×2 IMPLANT
TUBE CONNECTING 20X1/4 (TUBING) ×2 IMPLANT
WAND STAR VAC 90 (SURGICAL WAND) ×2 IMPLANT
WATER STERILE IRR 1000ML POUR (IV SOLUTION) ×2 IMPLANT
YANKAUER SUCT BULB TIP NO VENT (SUCTIONS) ×2 IMPLANT

## 2011-12-27 NOTE — Anesthesia Postprocedure Evaluation (Signed)
Anesthesia Post Note  Patient: Sharon Welch  Procedure(s) Performed: Procedure(s) (LRB): SHOULDER ARTHROSCOPY WITH SUBACROMIAL DECOMPRESSION, ROTATOR CUFF REPAIR AND BICEP TENDON REPAIR (Left)  Anesthesia type: General  Patient location: PACU  Post pain: Pain level controlled  Post assessment: Patient's Cardiovascular Status Stable  Last Vitals:  Filed Vitals:   12/27/11 1146  BP: 123/64  Pulse: 73  Temp:   Resp: 16    Post vital signs: Reviewed and stable  Level of consciousness: alert  Complications: No apparent anesthesia complications

## 2011-12-27 NOTE — Op Note (Signed)
12/27/2011  10:27 AM  PATIENT:  Sharon Welch    PRE-OPERATIVE DIAGNOSIS:  Left shoulder subscapularis tear with biceps dislocation  POST-OPERATIVE DIAGNOSIS:  Same  PROCEDURE:  Left shoulder arthroscopy with subscapularis repair and biceps tenodesis with extensive debridement  SURGEON:  Eulas Post, MD  PHYSICIAN ASSISTANT: Janace Litten, OPA-C, present and scrubbed throughout the case, critical for completion in a timely fashion, and for retraction, instrumentation, and closure.  ANESTHESIA:   General  PREOPERATIVE INDICATIONS:  Sharon Welch is a  41 y.o. 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 I had scheduled her for surgery over 7 months ago, however she was unable to proceed do to poor control of a seizure disorder. She had ongoing anterior shoulder pain.  The risks benefits and alternatives were discussed with the patient preoperatively including but not limited to the risks of infection, bleeding, nerve injury, cardiopulmonary complications, the need for revision surgery, among others, and the patient was willing to proceed. We also discussed the risks for Popeye deformity, incomplete relief of symptoms, the need for revision surgery, among others.  OPERATIVE IMPLANTS: Arthrex 4.75 mm swivel lock x2, double subscapularis repair, one medial and one lateral using inverted fiber tape mattress suture. I also reinforced the repair using the FiberWire suture in the lateral row.  OPERATIVE FINDINGS: The biceps tendon was dislocated, and the subscapularis was completely torn, both superiorly and inferiorly. This was medially retracted. The supraspinatus however was intact, as was the infraspinatus. The biceps tendon itself was shredded from chronic dislocation. It was attached to the superior labrum, but within the bicipital groove it had split into longitudinal tears, that left the tendon quality extremely poor, and was not amenable to repair. It was  shredded the entire length of the bicipital groove, and there was not enough good quality tendon to perform a tenodesis.  The glenohumeral articular cartilage was intact. The CA ligament appeared pristine, and there was no evidence for fraying, and for that reason I did not perform a acromioplasty, and I did not see evidence for significant subacromial spurring either, so I did not perform a distal clavicle resection. I treated her significant anterior shoulder pain to the subscapularis tear, rather than to the a.c. joint.  OPERATIVE PROCEDURE: The patient is brought to the operating room and placed in the supine position. General anesthesia was administered. The left upper extremity was examined and had full motion. Regional block also been given. The left upper extremity was prepped and draped in usual sterile fashion. Time out was performed. Diagnostic arthroscopy was carried out the above-named findings. I used the arthroscopic shaver to debride the footprint of the subscapularis insertion, and then also used a 4-0 bur. I released the biceps tendon with a single bite with the arthroscopic biter.  There was a slight bit of undersurface fraying of the supraspinatus which I debrided with the shaver. I released the rotator interval with the cautery, and gain access to the subscapularis tendon all the way down the anterior distal side. I mobilized it, and then had excellent tissue that was able to grasp. I placed a total of 2 anterior cannulas, and I alternated with the 70 arthroscope and the 30 arthroscope in order to see around a corner of the humerus.  Once I prepared the tendon and the footprint I passed an inverted fiber tape medially, and then a second row of inverted fiber tape laterally. I then placed my medial anchor, into the humeral head just near  the articular margin, and this advance the subscapularis tendon back into its anatomic location. I then secured it laterally with a second swivel lock  anchor. I utilized a 45 suture lasso for passage. I also tied an anterior reinforcement stitch using the extra FiberWire within the anchor, in order to gain better apposition to the bone. Excellent reapproximation of the tendon was achieved.  I then went to the subacromial space, and performed a complete bursectomy. The bursa was actually remarkably thickened. The cuff itself was intact from above. The CA ligament looked pristine, and I did not see any subacromial spurring, and so I did not perform an acromioplasty, nor did I performed a distal clavicle resection.  I then performed a small incision anteriorly over the deltopectoral interval, row tract and the cephalic vein laterally, and exposed the bicipital groove. The tendon was delivered into the wound, however the tendon itself was shredded, all the way to the level of the pectoralis tendon. There was no good tissue to work with. Therefore I removed the stump of the previous biceps tendon, and performed the tenolysis.  The wounds were irrigated copiously, and the tissue was repaired with Vicryl followed by Monocryl for the skin and portals. Steri-Strips were applied followed by sterile gauze. She was awakened and returned to the PACU in stable and satisfactory condition. There were no complications and she tolerated the procedure well.

## 2011-12-27 NOTE — Anesthesia Procedure Notes (Addendum)
Anesthesia Regional Block:  Interscalene brachial plexus block  Pre-Anesthetic Checklist: ,, timeout performed, Correct Patient, Correct Site, Correct Laterality, Correct Procedure, Correct Position, site marked, Risks and benefits discussed,  Surgical consent,  Pre-op evaluation,  At surgeon's request and post-op pain management  Laterality: Left  Prep: chloraprep       Needles:   Needle Type: Other   (Arrow Echogenic)   Needle Length: 9cm  Needle Gauge: 21    Additional Needles:  Procedures: ultrasound guided Interscalene brachial plexus block Narrative:  Start time: 12/27/2011 7:05 AM End time: 12/27/2011 7:16 AM Injection made incrementally with aspirations every 5 mL.  Performed by: Personally  Anesthesiologist: Aldona Lento, MD  Additional Notes: Ultrasound guidance used to: id relevant anatomy, confirm needle position, local anesthetic spread, avoidance of vascular puncture. Picture saved. No complications. Block performed personally by Janetta Hora. Gelene Mink, MD    Interscalene brachial plexus block Procedure Name: Intubation Date/Time: 12/27/2011 7:45 AM Performed by: Meyer Russel Pre-anesthesia Checklist: Patient identified, Emergency Drugs available, Suction available and Patient being monitored Patient Re-evaluated:Patient Re-evaluated prior to inductionOxygen Delivery Method: Circle System Utilized Preoxygenation: Pre-oxygenation with 100% oxygen Intubation Type: IV induction Ventilation: Mask ventilation without difficulty Laryngoscope Size: Miller and 2 Grade View: Grade I Tube type: Oral Tube size: 7.0 mm Number of attempts: 1 Airway Equipment and Method: LTA kit utilized Placement Confirmation: ETT inserted through vocal cords under direct vision,  positive ETCO2 and breath sounds checked- equal and bilateral Secured at: 20 cm Tube secured with: Tape Dental Injury: Teeth and Oropharynx as per pre-operative assessment

## 2011-12-27 NOTE — H&P (Signed)
PREOPERATIVE H&P  Chief Complaint: left shoulder complete rupture of rotator cuff, impingement, bicipital tenosynovitis/dislocation  HPI: Sharon Welch is a 41 y.o. female who presents for preoperative history and physical with a diagnosis of left shoulder complete rupture of rotator cuff, impingement, bicipital tenosynovitis. Symptoms are rated as moderate to severe, and have been worsening.  This is significantly impairing activities of daily living.  She has elected for surgical management. This has now been chronic, and it took [place] many months to get her optimized from a seizure management standpoint prior to proceeding with surgical intervention.  Past Medical History  Diagnosis Date  . Scoliosis   . Chronic back pain   . Seizures     started 9/12-  . Headache   . Chronic back pain    Past Surgical History  Procedure Date  . Back surgery 1989    scoliosis throsic-rods  . Tubal ligation   . Cesarean section   . Abdominal hysterectomy 2012   History   Social History  . Marital Status: Single    Spouse Name: N/A    Number of Children: N/A  . Years of Education: N/A   Social History Main Topics  . Smoking status: Current Everyday Smoker -- 1.0 packs/day for 20 years  . Smokeless tobacco: None  . Alcohol Use: Yes     rare  . Drug Use: No  . Sexually Active:    Other Topics Concern  . None   Social History Narrative  . None   History reviewed. No pertinent family history. Allergies  Allergen Reactions  . Tramadol Other (See Comments)    Possible seizures   Prior to Admission medications   Medication Sig Start Date End Date Taking? Authorizing Provider  amitriptyline (ELAVIL) 10 MG tablet Take 30 mg by mouth at bedtime.    Yes Historical Provider, MD  gabapentin (NEURONTIN) 300 MG capsule Take 600 mg by mouth 3 (three) times daily.    Yes Historical Provider, MD  HYDROcodone-acetaminophen (NORCO) 5-325 MG per tablet Take 1 tablet by mouth every 6 (six) hours as  needed. For pain    Yes Historical Provider, MD  levETIRAcetam (KEPPRA) 500 MG tablet Take 750 mg by mouth 2 (two) times daily.    Yes Historical Provider, MD  orphenadrine (NORFLEX) 100 MG tablet Take 100 mg by mouth 2 (two) times daily.   Yes Historical Provider, MD     Positive ROS: All other systems have been reviewed and were otherwise negative with the exception of those mentioned in the HPI and as above.  Physical Exam: General: Alert, no acute distress Cardiovascular: No pedal edema Respiratory: No cyanosis, no use of accessory musculature GI: No organomegaly, abdomen is soft and non-tender Skin: No lesions in the area of chief complaint Neurologic: Sensation intact distally Psychiatric: Patient is competent for consent with normal mood and affect Lymphatic: No axillary or cervical lymphadenopathy  MUSCULOSKELETAL: Left shoulder has anterior pain, weakness, positive liftoff test, and range of motion 0-90.  Assessment: left shoulder complete rupture of rotator cuff, impingement, bicipital tenosynovitis subscapularis tear with biceps dislocation.  Plan: Plan for Procedure(s): SHOULDER ARTHROSCOPY WITH SUBACROMIAL DECOMPRESSION, ROTATOR CUFF REPAIR AND BICEP TENDON REPAIR  The risks benefits and alternatives were discussed with the patient including but not limited to the risks of nonoperative treatment, versus surgical intervention including infection, bleeding, nerve injury,  blood clots, cardiopulmonary complications, morbidity, mortality, among others, and they were willing to proceed.   Eulas Post, MD Cell [phone]  Pager [phone]  12/27/2011 7:05 AM

## 2011-12-27 NOTE — Anesthesia Preprocedure Evaluation (Addendum)
Anesthesia Evaluation  Patient identified by MRN, date of birth, ID band Patient awake    Reviewed: Allergy & Precautions, H&P , NPO status , Patient's Chart, lab work & pertinent test results, reviewed documented beta blocker date and time   Airway Mallampati: II TM Distance: >3 FB Neck ROM: full    Dental   Pulmonary neg pulmonary ROS,          Cardiovascular negative cardio ROS      Neuro/Psych  Headaches, Seizures -,  negative neurological ROS  negative psych ROS   GI/Hepatic negative GI ROS, Neg liver ROS,   Endo/Other  negative endocrine ROS  Renal/GU negative Renal ROS  negative genitourinary   Musculoskeletal   Abdominal   Peds  Hematology negative hematology ROS (+)   Anesthesia Other Findings See surgeon's H&P   Reproductive/Obstetrics negative OB ROS                           Anesthesia Physical Anesthesia Plan  ASA: II  Anesthesia Plan: General   Post-op Pain Management:    Induction: Intravenous  Airway Management Planned: Oral ETT  Additional Equipment:   Intra-op Plan:   Post-operative Plan: Extubation in OR  Informed Consent: I have reviewed the patients History and Physical, chart, labs and discussed the procedure including the risks, benefits and alternatives for the proposed anesthesia with the patient or authorized representative who has indicated his/her understanding and acceptance.   Dental Advisory Given  Plan Discussed with: CRNA and Surgeon  Anesthesia Plan Comments:         Anesthesia Quick Evaluation

## 2011-12-27 NOTE — Transfer of Care (Signed)
Immediate Anesthesia Transfer of Care Note  Patient: Sharon Welch  Procedure(s) Performed: Procedure(s) (LRB): SHOULDER ARTHROSCOPY WITH SUBACROMIAL DECOMPRESSION, ROTATOR CUFF REPAIR AND BICEP TENDON REPAIR (Left)  Patient Location: PACU  Anesthesia Type: GA combined with regional for post-op pain  Level of Consciousness: sedated  Airway & Oxygen Therapy: Patient Spontanous Breathing and Patient connected to face mask oxygen  Post-op Assessment: Report given to PACU RN and Post -op Vital signs reviewed and stable  Post vital signs: Reviewed and stable  Complications: No apparent anesthesia complications

## 2011-12-27 NOTE — Progress Notes (Signed)
Assisted Dr. Frederick with left, ultrasound guided, interscalene  block. Side rails up, monitors on throughout procedure. See vital signs in flow sheet. Tolerated Procedure well. 

## 2012-01-14 ENCOUNTER — Ambulatory Visit: Payer: Self-pay | Admitting: Pain Medicine

## 2012-02-13 ENCOUNTER — Ambulatory Visit: Payer: Self-pay | Admitting: Pain Medicine

## 2012-02-26 ENCOUNTER — Ambulatory Visit: Payer: Self-pay | Admitting: Pain Medicine

## 2012-03-12 ENCOUNTER — Ambulatory Visit: Payer: Self-pay | Admitting: Pain Medicine

## 2012-03-13 ENCOUNTER — Other Ambulatory Visit: Payer: Self-pay | Admitting: Family Medicine

## 2012-03-13 DIAGNOSIS — Z1231 Encounter for screening mammogram for malignant neoplasm of breast: Secondary | ICD-10-CM

## 2012-03-30 ENCOUNTER — Ambulatory Visit: Payer: Self-pay | Admitting: Pain Medicine

## 2012-04-03 ENCOUNTER — Ambulatory Visit
Admission: RE | Admit: 2012-04-03 | Discharge: 2012-04-03 | Disposition: A | Discharge status: Home / Self Care | Payer: 59 | Source: Ambulatory Visit | Attending: Family Medicine | Admitting: Family Medicine

## 2012-04-03 DIAGNOSIS — Z1231 Encounter for screening mammogram for malignant neoplasm of breast: Secondary | ICD-10-CM

## 2012-04-09 ENCOUNTER — Ambulatory Visit: Payer: Self-pay | Admitting: Pain Medicine

## 2012-05-11 ENCOUNTER — Other Ambulatory Visit (HOSPITAL_COMMUNITY)
Admission: RE | Admit: 2012-05-11 | Discharge: 2012-05-11 | Disposition: A | Discharge status: Home / Self Care | Payer: Medicaid Other | Source: Ambulatory Visit | Attending: Obstetrics and Gynecology | Admitting: Obstetrics and Gynecology

## 2012-05-11 DIAGNOSIS — Z1151 Encounter for screening for human papillomavirus (HPV): Secondary | ICD-10-CM | POA: Insufficient documentation

## 2012-05-11 DIAGNOSIS — Z01419 Encounter for gynecological examination (general) (routine) without abnormal findings: Secondary | ICD-10-CM | POA: Insufficient documentation

## 2012-05-12 ENCOUNTER — Ambulatory Visit: Payer: Self-pay | Admitting: Pain Medicine

## 2012-05-25 ENCOUNTER — Ambulatory Visit: Payer: Self-pay | Admitting: Pain Medicine

## 2012-06-11 ENCOUNTER — Ambulatory Visit: Payer: Self-pay | Admitting: Pain Medicine

## 2012-06-29 ENCOUNTER — Ambulatory Visit: Payer: Self-pay | Admitting: Pain Medicine

## 2012-07-09 ENCOUNTER — Ambulatory Visit: Payer: Self-pay | Admitting: Pain Medicine

## 2012-07-20 ENCOUNTER — Ambulatory Visit: Payer: Self-pay | Admitting: Pain Medicine

## 2012-08-04 ENCOUNTER — Other Ambulatory Visit: Payer: Self-pay | Admitting: Neurosurgery

## 2012-08-04 DIAGNOSIS — M549 Dorsalgia, unspecified: Secondary | ICD-10-CM

## 2012-08-04 DIAGNOSIS — M542 Cervicalgia: Secondary | ICD-10-CM

## 2012-08-05 ENCOUNTER — Ambulatory Visit: Payer: Self-pay | Admitting: Pain Medicine

## 2012-08-14 ENCOUNTER — Ambulatory Visit
Admission: RE | Admit: 2012-08-14 | Discharge: 2012-08-14 | Disposition: A | Discharge status: Home / Self Care | Payer: Medicaid Other | Source: Ambulatory Visit | Attending: Neurosurgery | Admitting: Neurosurgery

## 2012-08-14 VITALS — BP 110/71 | HR 72

## 2012-08-14 DIAGNOSIS — M542 Cervicalgia: Secondary | ICD-10-CM

## 2012-08-14 DIAGNOSIS — M549 Dorsalgia, unspecified: Secondary | ICD-10-CM

## 2012-08-14 MED ORDER — IOHEXOL 300 MG/ML  SOLN
10.0000 mL | Freq: Once | INTRAMUSCULAR | Status: AC | PRN
  Administered 2012-08-14: 10 mL via INTRATHECAL

## 2012-08-14 MED ORDER — ONDANSETRON HCL 4 MG/2ML IJ SOLN
4.0000 mg | Freq: Once | INTRAMUSCULAR | Status: AC
  Administered 2012-08-14: 4 mg via INTRAMUSCULAR

## 2012-08-14 MED ORDER — MEPERIDINE HCL 100 MG/ML IJ SOLN
75.0000 mg | Freq: Once | INTRAMUSCULAR | Status: AC
  Administered 2012-08-14: 75 mg via INTRAMUSCULAR

## 2012-08-14 MED ORDER — DIAZEPAM 5 MG PO TABS
10.0000 mg | ORAL_TABLET | Freq: Once | ORAL | Status: AC
  Administered 2012-08-14: 10 mg via ORAL

## 2012-09-03 ENCOUNTER — Ambulatory Visit: Payer: Self-pay | Admitting: Pain Medicine

## 2012-09-07 ENCOUNTER — Ambulatory Visit: Payer: Self-pay | Admitting: Pain Medicine

## 2012-10-06 ENCOUNTER — Ambulatory Visit: Payer: Self-pay | Admitting: Pain Medicine

## 2012-10-19 ENCOUNTER — Ambulatory Visit: Payer: Self-pay | Admitting: Pain Medicine

## 2012-10-23 ENCOUNTER — Other Ambulatory Visit: Payer: Self-pay | Admitting: Obstetrics and Gynecology

## 2012-10-23 ENCOUNTER — Ambulatory Visit (HOSPITAL_COMMUNITY)
Admission: RE | Admit: 2012-10-23 | Discharge: 2012-10-23 | Disposition: A | Discharge status: Home / Self Care | Payer: Medicaid Other | Source: Ambulatory Visit | Attending: Obstetrics and Gynecology | Admitting: Obstetrics and Gynecology

## 2012-10-23 DIAGNOSIS — Z90710 Acquired absence of both cervix and uterus: Secondary | ICD-10-CM | POA: Insufficient documentation

## 2012-10-23 DIAGNOSIS — R1032 Left lower quadrant pain: Secondary | ICD-10-CM

## 2012-10-28 ENCOUNTER — Other Ambulatory Visit: Payer: Self-pay | Admitting: Family Medicine

## 2012-10-28 DIAGNOSIS — R1032 Left lower quadrant pain: Secondary | ICD-10-CM

## 2012-10-30 ENCOUNTER — Ambulatory Visit
Admission: RE | Admit: 2012-10-30 | Discharge: 2012-10-30 | Disposition: A | Discharge status: Home / Self Care | Payer: Medicaid Other | Source: Ambulatory Visit | Attending: Family Medicine | Admitting: Family Medicine

## 2012-10-30 DIAGNOSIS — R1032 Left lower quadrant pain: Secondary | ICD-10-CM

## 2012-10-30 MED ORDER — IOHEXOL 300 MG/ML  SOLN
100.0000 mL | Freq: Once | INTRAMUSCULAR | Status: AC | PRN
  Administered 2012-10-30: 100 mL via INTRAVENOUS

## 2012-10-31 ENCOUNTER — Encounter (HOSPITAL_COMMUNITY): Payer: Self-pay | Admitting: *Deleted

## 2012-10-31 ENCOUNTER — Emergency Department (HOSPITAL_COMMUNITY): Payer: Medicaid Other

## 2012-10-31 ENCOUNTER — Emergency Department (HOSPITAL_COMMUNITY)
Admission: EM | Admit: 2012-10-31 | Discharge: 2012-10-31 | Disposition: A | Discharge status: Home / Self Care | Payer: Medicaid Other | Attending: Emergency Medicine | Admitting: Emergency Medicine

## 2012-10-31 DIAGNOSIS — M549 Dorsalgia, unspecified: Secondary | ICD-10-CM | POA: Insufficient documentation

## 2012-10-31 DIAGNOSIS — K8020 Calculus of gallbladder without cholecystitis without obstruction: Secondary | ICD-10-CM

## 2012-10-31 DIAGNOSIS — G40909 Epilepsy, unspecified, not intractable, without status epilepticus: Secondary | ICD-10-CM | POA: Insufficient documentation

## 2012-10-31 DIAGNOSIS — G8929 Other chronic pain: Secondary | ICD-10-CM | POA: Insufficient documentation

## 2012-10-31 DIAGNOSIS — Z79899 Other long term (current) drug therapy: Secondary | ICD-10-CM | POA: Insufficient documentation

## 2012-10-31 DIAGNOSIS — F17200 Nicotine dependence, unspecified, uncomplicated: Secondary | ICD-10-CM | POA: Insufficient documentation

## 2012-10-31 DIAGNOSIS — R112 Nausea with vomiting, unspecified: Secondary | ICD-10-CM | POA: Insufficient documentation

## 2012-10-31 LAB — CBC WITH DIFFERENTIAL/PLATELET
Basophils Absolute: 0 K/uL (ref 0.0–0.1)
Basophils Relative: 1 % (ref 0–1)
Eosinophils Absolute: 0.1 10*3/uL (ref 0.0–0.7)
Eosinophils Relative: 2 % (ref 0–5)
HCT: 35.7 % — ABNORMAL LOW (ref 36.0–46.0)
Hemoglobin: 13.1 g/dL (ref 12.0–15.0)
Lymphocytes Relative: 35 % (ref 12–46)
Lymphs Abs: 1.7 10*3/uL (ref 0.7–4.0)
MCH: 32.8 pg (ref 26.0–34.0)
MCHC: 36.7 g/dL — ABNORMAL HIGH (ref 30.0–36.0)
MCV: 89.3 fL (ref 78.0–100.0)
Monocytes Absolute: 0.6 K/uL (ref 0.1–1.0)
Monocytes Relative: 12 % (ref 3–12)
Neutro Abs: 2.5 10*3/uL (ref 1.7–7.7)
Neutrophils Relative %: 50 % (ref 43–77)
Platelets: 176 10*3/uL (ref 150–400)
RBC: 4 MIL/uL (ref 3.87–5.11)
RDW: 11.8 % (ref 11.5–15.5)
WBC: 5 10*3/uL (ref 4.0–10.5)

## 2012-10-31 LAB — URINALYSIS, ROUTINE W REFLEX MICROSCOPIC
Bilirubin Urine: NEGATIVE
Glucose, UA: NEGATIVE mg/dL
Hgb urine dipstick: NEGATIVE
Ketones, ur: NEGATIVE mg/dL
Leukocytes, UA: NEGATIVE
Nitrite: NEGATIVE
Protein, ur: NEGATIVE mg/dL
Specific Gravity, Urine: 1.008 (ref 1.005–1.030)
Urobilinogen, UA: 0.2 mg/dL (ref 0.0–1.0)
pH: 7 (ref 5.0–8.0)

## 2012-10-31 LAB — COMPREHENSIVE METABOLIC PANEL WITH GFR
AST: 11 U/L (ref 0–37)
CO2: 25 meq/L (ref 19–32)
Calcium: 8.4 mg/dL (ref 8.4–10.5)
Creatinine, Ser: 0.58 mg/dL (ref 0.50–1.10)
GFR calc non Af Amer: >90 mL/min (ref >90)

## 2012-10-31 LAB — COMPREHENSIVE METABOLIC PANEL
ALT: <5 U/L (ref 0–35)
Albumin: 3.5 g/dL (ref 3.5–5.2)
Alkaline Phosphatase: 54 U/L (ref 39–117)
BUN: 9 mg/dL (ref 6–23)
Chloride: 104 mEq/L (ref 96–112)
GFR calc Af Amer: >90 mL/min (ref >90)
Glucose, Bld: 84 mg/dL (ref 70–99)
Potassium: 3 mEq/L — ABNORMAL LOW (ref 3.5–5.1)
Sodium: 139 mEq/L (ref 135–145)
Total Bilirubin: 0.2 mg/dL — ABNORMAL LOW (ref 0.3–1.2)
Total Protein: 6.5 g/dL (ref 6.0–8.3)

## 2012-10-31 LAB — LIPASE, BLOOD: Lipase: 15 U/L (ref 11–59)

## 2012-10-31 MED ORDER — HYDROMORPHONE HCL PF 1 MG/ML IJ SOLN
1.0000 mg | Freq: Once | INTRAMUSCULAR | Status: AC
  Administered 2012-10-31: 1 mg via INTRAVENOUS
  Filled 2012-10-31: qty 1

## 2012-10-31 MED ORDER — PROMETHAZINE HCL 25 MG PO TABS: 25.0000 mg | ORAL_TABLET | Freq: Four times a day (QID) | ORAL | Status: DC | PRN

## 2012-10-31 MED ORDER — HYDROCODONE-ACETAMINOPHEN 5-325 MG PO TABS: 2.0000 | ORAL_TABLET | ORAL | Status: DC | PRN

## 2012-10-31 MED ORDER — ONDANSETRON HCL 4 MG/2ML IJ SOLN
4.0000 mg | Freq: Once | INTRAMUSCULAR | Status: AC
  Administered 2012-10-31: 4 mg via INTRAVENOUS
  Filled 2012-10-31: qty 2

## 2012-10-31 NOTE — ED Notes (Signed)
Patient returned from Ultrasound. 

## 2012-10-31 NOTE — ED Notes (Signed)
Pt is here with right upper abdominal pain and had CT scan yesterday for the same symptoms and it revealed gallstones.  Pt is here with vomiting bile since 0830

## 2012-10-31 NOTE — ED Notes (Signed)
ED PA at bedside

## 2012-10-31 NOTE — ED Notes (Signed)
Patient transported to Ultrasound 

## 2012-10-31 NOTE — ED Provider Notes (Signed)
History     CSN: [id number]  Arrival date & time 10/31/12  1202   First MD Initiated Contact with Patient 10/31/12 1412      Chief Complaint  Patient presents with  . Abdominal Pain  . Emesis    (Consider location/radiation/quality/duration/timing/severity/associated sxs/prior treatment) HPI Comments: Patient is a 42 year old female presents today with worsening abdominal pain over the past 2 weeks. She had a CT scan done yesterday which showed cholelithiasis and possible choledocholithiasis. She states she has been nauseous with this cramping right upper quadrant pain. The pain radiates to her back and into her stomach. Today she began vomiting. She states she came in because the vomiting was bilious. Vomit was nonbloody. She has not done anything to make her symptoms better. She does go to pain management for chronic back pain, but states she is not on a pain contract. She denies fever, chills, diarrhea, shortness of breath, chest pain, numbness, weakness.  The history is provided by the patient. No language interpreter was used.    Past Medical History  Diagnosis Date  . Scoliosis   . Chronic back pain   . Seizures     started 9/12-  . Headache   . Chronic back pain   . Partial tear subscapularis tendon 12/27/2011    Past Surgical History  Procedure Laterality Date  . Back surgery  1989    scoliosis throsic-rods  . Tubal ligation    . Cesarean section    . Abdominal hysterectomy  2012    No family history on file.  History  Substance Use Topics  . Smoking status: Current Every Day Smoker -- 1.00 packs/day for 20 years  . Smokeless tobacco: Not on file  . Alcohol Use: Yes     Comment: rare    OB History   Grav Para Term Preterm Abortions TAB SAB Ect Mult Living                  Review of Systems  Constitutional: Negative for fever and chills.  Gastrointestinal: Positive for nausea, vomiting and abdominal pain.  All other systems reviewed and are  negative.    Allergies  Tramadol  Home Medications   Current Outpatient Rx  Name  Route  Sig  Dispense  Refill  . amitriptyline (ELAVIL) 10 MG tablet   Oral   Take 10-30 mg by mouth at bedtime. For sleep         . gabapentin (NEURONTIN) 300 MG capsule   Oral   Take 900-1,200 mg by mouth 3 (three) times daily. Take 3 capsule in the morning and 4 capsule at night         . HYDROcodone-acetaminophen (NORCO) 7.5-325 MG per tablet   Oral   Take 1 tablet by mouth 4 (four) times daily.         Marland Kitchen ibuprofen (ADVIL,MOTRIN) 800 MG tablet   Oral   Take 800 mg by mouth every 8 (eight) hours as needed for pain.         Marland Kitchen levETIRAcetam (KEPPRA) 750 MG tablet   Oral   Take 1,500 mg by mouth 2 (two) times daily.         Marland Kitchen tiZANidine (ZANAFLEX) 2 MG tablet   Oral   Take 2 mg by mouth every 6 (six) hours as needed (for muscle spasms).           BP 129/75  Pulse 79  Temp(Src) 99.1 F (37.3 C) (Oral)  Resp 16  SpO2  98%  Physical Exam  Nursing note and vitals reviewed. Constitutional: She is oriented to person, place, and time. She appears well-developed and well-nourished. No distress.  HENT:  Head: Normocephalic and atraumatic.  Right Ear: External ear normal.  Left Ear: External ear normal.  Nose: Nose normal.  Mouth/Throat: Oropharynx is clear and moist.  Eyes: Conjunctivae are normal.  Neck: Normal range of motion.  Cardiovascular: Normal rate, regular rhythm and normal heart sounds.   Pulmonary/Chest: Effort normal and breath sounds normal. No stridor. No respiratory distress. She has no wheezes. She has no rales.  Abdominal: Soft. She exhibits no distension. There is tenderness in the right upper quadrant. There is no rigidity, no rebound, no guarding and negative Murphy's sign.  Musculoskeletal: Normal range of motion.  Neurological: She is alert and oriented to person, place, and time. She has normal strength.  Skin: Skin is warm and dry. She is not  diaphoretic. No erythema.  Psychiatric: She has a normal mood and affect. Her behavior is normal.    ED Course  Procedures (including critical care time)  Labs Reviewed  CBC WITH DIFFERENTIAL - Abnormal; Notable for the following:    HCT 35.7 (*)    MCHC 36.7 (*)    All other components within normal limits  COMPREHENSIVE METABOLIC PANEL - Abnormal; Notable for the following:    Potassium 3.0 (*)    Total Bilirubin 0.2 (*)    All other components within normal limits  URINALYSIS, ROUTINE W REFLEX MICROSCOPIC  LIPASE, BLOOD   US Abdomen Complete  10/31/2012  *RADIOLOGY REPORT*  Clinical Data:  Right upper quadrant abdominal pain. Cholelithiasis.  COMPLETE ABDOMINAL ULTRASOUND  Comparison:  CT abdomen and pelvis 10/30/2012.  Findings:  Gallbladder:  Large shadowing gallstones are again noted.  Wall thickness is within normal limits at 2.1 mm.  There is no sonographic Murphy's sign.  Common bile duct:  The common bile duct is dilated, measuring up to 10.6 mm.  No discrete lesion or obstructing stone is evident.  Liver:  No focal mass lesion seen.  Within normal limits in parenchymal echogenicity.  IVC:  Appears normal.  Pancreas:  Although the pancreas is difficult to visualize in its entirety, no focal pancreatic abnormality is identified.  Spleen:  Normal size and echotexture without focal parenchymal abnormality.The maximal length is 7.3 cm, within normal limits.  Right Kidney:  No hydronephrosis.  Well-preserved cortex.  Normal size and parenchymal echotexture without focal abnormalities. The maximal length is 9.3 cm, within normal limits.  Left Kidney:  No hydronephrosis.  Well-preserved cortex.  Normal size and parenchymal echotexture without focal abnormalities. The maximal length is 9.4 cm, within normal limits.  Abdominal aorta:  No aneurysm identified.  IMPRESSION:  1.  Dilation of the common biliary duct without to evidence for focal obstruction or stone. 2.  Cholelithiasis without  evidence for cholecystitis.   Original Report Authenticated By: Marin Roberts, M.D.    Ct Abdomen Pelvis W Contrast  10/30/2012  *RADIOLOGY REPORT*  Clinical Data: Left lower quadrant abdominal pain.  Scoliosis. Renal calculi.  Nausea.  CT ABDOMEN AND PELVIS WITH CONTRAST  Technique:  Multidetector CT imaging of the abdomen and pelvis was performed following the standard protocol during bolus administration of intravenous contrast.  Contrast: OMNIPAQUE IOHEXOL 300 MG/ML  SOLN  Comparison: Multiple exams, including 10/23/2012 ultrasound  Findings: Prominent levoconvex lower thoracic scoliosis observed. Rod fixation noted. The left T10 pedicle appears very thin and there is apparent absence of the left  T11 pedicle.  Gracile and mostly demineralized left ninth rib noted, with the left tenth and eleventh ribs having a point the medial margin which extends towards the neural foramina but does not have a significant articulation with the vertebral body.  Prominent widening of the left neural foramen observed at T8-9 and T9-10.  Mild asymmetry of the paraspinal soft tissue density in this vicinity likely reflects musculature.  The T12 ribs are hypoplastic.  Trace bilateral pleural effusions noted. A 6 mm hypodense lesion is observed in segment seven on image 16 of series 3, and is not well seen on the delayed postcontrast images.  Several punctate hypodensities are present in the dome of the liver.  The spleen, pancreas, and adrenal glands appear unremarkable. Multiple gallstones are observed in the gallbladder dependently, measuring up to 8 mm in diameter.  Common bile duct measures up to 7 mm in diameter, mildly prominent, and there is abrupt truncation of the CBD on image 51 of series 601 suspicious for choledocholithiasis for a distal CBD lesion.  No significant intrahepatic biliary dilatation.  No pathologic retroperitoneal or porta hepatis adenopathy is identified.  Appendix unremarkable.  Orally  administered contrast extends through to the colon.  1.1 x 0.8 cm left mid kidney cyst noted; kidneys otherwise unremarkable.  No pathologic pelvic adenopathy is identified.  A 1.8 x 1.5 cm lesion along the upper margin of the left ovary has a hyperdense rim.  Similarly, a 1.7 x 1.4 cm lesion of the right ovary has a hyperdense rim.  There is also a 4.7 x 4.2 x 4.0 cm fluid density lesion along the medial aspect the right ovary.  Uterus absent.  Urinary bladder unremarkable.  No free pelvic fluid.  Mild irregularity in the right iliac bone is observed, probably from prior harvesting.  This is less likely to be due to Paget's disease.  IMPRESSION:  1.  Cholelithiasis, with abrupt truncation of the mildly dilated common bile duct suspicious for choledocholithiasis. 2.  Hypodense bilateral ovarian lesions with a hyperdense rim which may be calcified or simply have an enhancing margin.  There is also a 4.7 cm cyst of the right ovary which appears to have enlarged from the exam from 1 week ago. None of these lesions were particularly concerning on the prior pelvic ultrasound from 10/23/2012, and are accordingly likely physiologic/incidental. 3.  Prominent levoconvex lower thoracic scoliosis with suspected congenital and / or postoperative anomalies along the left side of the thoracic spine, including absent left T11 pedicle, gracile left T10 pedicle, nearly completely demineralized left ninth rib, hyperplastic proximal aspects of the left tenth and eleventh ribs extending towards the neural foramen without significant articulation with the vertebral body, and prominent widening of the left neural foramina at T8-9 and T9-10. 4.  Trace bilateral pleural effusions.   Original Report Authenticated By: Gaylyn Rong, M.D.      1. Cholelithiasis       MDM  Patient presents today with known gallstones shown in CT yesterday. New onset vomiting today. Abdominal US shows cholelithiasis without obstruction or evidence  of cholecystitis. Discussed the results with the patient. GI follow up for symptomatic treatment. Abdomen soft, not surgical. Afebrile. Sent home with antiemetic and pain medication. Return instructions given. Vital signs stable for discharge. Patient / Family / Caregiver informed of clinical course, understand medical decision-making process, and agree with plan.         KINGSLEE DOWSE, PA-C 10/31/12 1909

## 2012-11-02 ENCOUNTER — Other Ambulatory Visit: Payer: Self-pay | Admitting: Gastroenterology

## 2012-11-02 NOTE — ED Provider Notes (Signed)
Medical screening examination/treatment/procedure(s) were performed by non-physician practitioner and as supervising physician I was immediately available for consultation/collaboration.   Raji Glinski Y. Aseem Sessums, MD 11/02/12 2325 

## 2012-11-03 ENCOUNTER — Encounter (HOSPITAL_COMMUNITY): Payer: Self-pay | Admitting: *Deleted

## 2012-11-03 ENCOUNTER — Ambulatory Visit (HOSPITAL_COMMUNITY)
Admission: RE | Admit: 2012-11-03 | Discharge: 2012-11-03 | Disposition: A | Discharge status: Home / Self Care | Payer: Medicaid Other | Source: Ambulatory Visit | Attending: Gastroenterology | Admitting: Gastroenterology

## 2012-11-03 ENCOUNTER — Ambulatory Visit: Payer: Self-pay | Admitting: Pain Medicine

## 2012-11-03 ENCOUNTER — Encounter (HOSPITAL_COMMUNITY)
Admission: RE | Disposition: A | Discharge status: Home / Self Care | Payer: Self-pay | Source: Ambulatory Visit | Attending: Gastroenterology

## 2012-11-03 DIAGNOSIS — K838 Other specified diseases of biliary tract: Secondary | ICD-10-CM | POA: Insufficient documentation

## 2012-11-03 DIAGNOSIS — K8020 Calculus of gallbladder without cholecystitis without obstruction: Secondary | ICD-10-CM | POA: Insufficient documentation

## 2012-11-03 DIAGNOSIS — R933 Abnormal findings on diagnostic imaging of other parts of digestive tract: Secondary | ICD-10-CM | POA: Insufficient documentation

## 2012-11-03 DIAGNOSIS — R1011 Right upper quadrant pain: Secondary | ICD-10-CM | POA: Insufficient documentation

## 2012-11-03 HISTORY — PX: EUS: SHX5427

## 2012-11-03 SURGERY — UPPER ENDOSCOPIC ULTRASOUND (EUS) LINEAR
Anesthesia: Moderate Sedation

## 2012-11-03 MED ORDER — MIDAZOLAM HCL 10 MG/2ML IJ SOLN
INTRAMUSCULAR | Status: AC
  Filled 2012-11-03: qty 2

## 2012-11-03 MED ORDER — FENTANYL CITRATE 0.05 MG/ML IJ SOLN
INTRAMUSCULAR | Status: AC
  Filled 2012-11-03: qty 2

## 2012-11-03 MED ORDER — MIDAZOLAM HCL 10 MG/2ML IJ SOLN
INTRAMUSCULAR | Status: DC | PRN
  Administered 2012-11-03 (×3): 2.5 mg via INTRAVENOUS

## 2012-11-03 MED ORDER — DIPHENHYDRAMINE HCL 50 MG/ML IJ SOLN
INTRAMUSCULAR | Status: DC | PRN
  Administered 2012-11-03: 50 mg via INTRAVENOUS

## 2012-11-03 MED ORDER — BUTAMBEN-TETRACAINE-BENZOCAINE 2-2-14 % EX AERO
INHALATION_SPRAY | CUTANEOUS | Status: DC | PRN
  Administered 2012-11-03: 2 via TOPICAL

## 2012-11-03 MED ORDER — SODIUM CHLORIDE 0.9 % IV SOLN: INTRAVENOUS | Status: DC

## 2012-11-03 MED ORDER — DIPHENHYDRAMINE HCL 50 MG/ML IJ SOLN
INTRAMUSCULAR | Status: AC
  Filled 2012-11-03: qty 1

## 2012-11-03 MED ORDER — FENTANYL CITRATE 0.05 MG/ML IJ SOLN
INTRAMUSCULAR | Status: DC | PRN
  Administered 2012-11-03 (×3): 25 ug via INTRAVENOUS

## 2012-11-03 NOTE — Op Note (Signed)
[facility] [address] [place], [address]   ENDOSCOPIC ULTRASOUND PROCEDURE REPORT  PATIENT: Sharon, Welch  MR#: [id number] BIRTHDATE: April 06, 1971  GENDER: Female ENDOSCOPIST: Jeani Hawking, MD REFERRED BY: PROCEDURE DATE:  11/03/2012 PROCEDURE:   Upper EUS ASA CLASS:      Class III INDICATIONS:   1.  abnormal CT of the GI tract. MEDICATIONS: Versed 7 mg IV, Fentanyl 75 mcg IV, and Benadryl 25 mg IV  DESCRIPTION OF PROCEDURE:   After the risks benefits and alternatives of the procedure were  explained, informed consent was obtained. The patient was then placed in the left, lateral, decubitus postion and IV sedation was administered. Throughout the procedure, the patients blood pressure, pulse and oxygen saturations were monitored continuously.  Under direct visualization, the     endoscope was introduced through the mouth and advanced to the second portion of the duodenum .  Water was used as necessary to provide an acoustic interface.  Upon completion of the imaging, water was removed and the patient was sent to the recovery room in satisfactory condition.      BILIARY SYSTEM: The CBD was dilated 8 mm in the mid to proximal portion of her duct.  No evidence of any sludge, stones, masses, or strictures in the CBD.  A clear view of the distal CBD was obtained and there was no evidence of any stones.   The scope was then withdrawn from the patient and the procedure completed.  COMPLICATIONS: There were no complications. ENDOSCOPIC VISUALIZATION:  ULTRASONIC VISUALIZATION:  STAGING:  ENDOSCOPIC IMPRESSION: 1) Moderately dilated CBD without any evidence of stones or sludge.  RECOMMENDATIONS: 1) Surgical consultation for lap chole.   _______________________________ eSignedJeani Hawking, MD 11/03/2012 2:51 PM   CC:

## 2012-11-03 NOTE — H&P (Signed)
Reason for Consult: ? Choledocholithiasis Referring Physician: [facility]  Macario Golds HPI: This is a 42 year old female with complaints of RUQ pain.  Her pain worsened to the point that she presented to the ER on two occassions.  A CT scan and a RUQ U/S revealed cholelithiasis, but not cholecystitis.  Her CBD was dilated between 7-10 cm and there is the possibility of choledocholithiasis.  Her liver enzymes were normal.  Despite her persistent pain she was discharged from the ER.  Past Medical History  Diagnosis Date  . Scoliosis   . Chronic back pain   . Seizures     started 9/12-  . Headache   . Chronic back pain   . Partial tear subscapularis tendon 12/27/2011    Past Surgical History  Procedure Laterality Date  . Back surgery  1989    scoliosis throsic-rods  . Tubal ligation    . Cesarean section    . Abdominal hysterectomy  2012  . Left arm      History reviewed. No pertinent family history.  Social History:  reports that she has been smoking.  She has never used smokeless tobacco. She reports that  drinks alcohol. She reports that she does not use illicit drugs.  Allergies:  Allergies  Allergen Reactions  . Tramadol Other (See Comments)    Possible seizures    Medications:  Scheduled:  Continuous: . sodium chloride      No results found for this or any previous visit (from the past 24 hour(s)).   No results found.  ROS:  As stated above in the HPI otherwise negative.  Blood pressure 110/65, temperature 98.2 F (36.8 C), temperature source Oral, resp. rate 16, height 5\' 1"  (1.549 m), weight 106 lb (48.081 kg), SpO2 98.00%.    PE: Gen: NAD, Alert and Oriented HEENT:  [place]/AT, EOMI Neck: Supple, no LAD Lungs: CTA Bilaterally CV: RRR without M/G/R ABM: Soft,  Tender in the RUQ/epigastrium, +BS Ext: No C/C/E  Assessment/Plan: 1) Abnormal imaging. 2) Cholelithaisis.  Plan: 1) EUS +/- ERCP.  Tejal Monroy D 11/03/2012, 1:52 PM

## 2012-11-05 ENCOUNTER — Encounter (HOSPITAL_COMMUNITY): Payer: Self-pay | Admitting: Gastroenterology

## 2012-11-06 ENCOUNTER — Telehealth (INDEPENDENT_AMBULATORY_CARE_PROVIDER_SITE_OTHER): Payer: Self-pay

## 2012-11-06 ENCOUNTER — Ambulatory Visit (INDEPENDENT_AMBULATORY_CARE_PROVIDER_SITE_OTHER): Payer: Medicaid Other | Admitting: General Surgery

## 2012-11-06 ENCOUNTER — Encounter (INDEPENDENT_AMBULATORY_CARE_PROVIDER_SITE_OTHER): Payer: Self-pay | Admitting: General Surgery

## 2012-11-06 VITALS — BP 92/72 | HR 101 | Temp 96.7°F | Ht 61.0 in | Wt 107.4 lb

## 2012-11-06 DIAGNOSIS — K8020 Calculus of gallbladder without cholecystitis without obstruction: Secondary | ICD-10-CM

## 2012-11-06 NOTE — Telephone Encounter (Signed)
Patients husband called stating his wife needs a sooner appointment. He says his wife was seen in ER and Ct showed cholelithiasis and PA told them gallbladder needed to come out and that they were going to set her up with a surgeon. ER never consulted with our surgeons. Instead they set her up with Dr Elnoria Howard for an endoscopy 3 days later. Dr Elnoria Howard ended up telling them same thing ER PA told them and set appointment up in our office for next week. She is still symptomatic and asking if she can be seen sooner. They do not want to go to ER and get the run around again. I arranged for them to come in this afternoon to see Dr Derrell Lolling.

## 2012-11-06 NOTE — Progress Notes (Signed)
Patient ID: Sharon Welch, female   DOB: 1971-06-21, 42 y.o.   MRN: [id number]  Chief Complaint  Patient presents with  . New Evaluation    eval gallstones    HPI Sharon Welch is a 42 y.o. female.  The patient is a 42 year old female who was previously seen in the ER for evaluation of epigastric pain. Patient was seen to have cholelithiasis and dilated common bile duct. Patient was referred to Dr. Elnoria Howard who underwent ERCP. There is no choledocholithiasis. Patient was secondarily referred for a laparoscopic cholecystectomy.  HPI  Past Medical History  Diagnosis Date  . Scoliosis   . Chronic back pain   . Seizures     started 9/12-  . Headache   . Chronic back pain   . Partial tear subscapularis tendon 12/27/2011    Past Surgical History  Procedure Laterality Date  . Back surgery  1989    scoliosis throsic-rods  . Tubal ligation    . Cesarean section    . Abdominal hysterectomy  2012  . Left arm    . Eus N/A 11/03/2012    Procedure: UPPER ENDOSCOPIC ULTRASOUND (EUS) LINEAR;  Surgeon: Theda Belfast, MD;  Location: [facility];  Service: Endoscopy;  Laterality: N/A;    Family History  Problem Relation Age of Onset  . Hypertension Mother   . Hypertension Father   . Cancer Father     prostate  . Heart disease Father   . Hypertension Brother   . Hypertension Brother     Social History History  Substance Use Topics  . Smoking status: Current Every Day Smoker -- 1.00 packs/day for 20 years    Types: Cigarettes  . Smokeless tobacco: Never Used  . Alcohol Use: Yes     Comment: rare    Allergies  Allergen Reactions  . Tramadol Other (See Comments)    Possible seizures    Current Outpatient Prescriptions  Medication Sig Dispense Refill  . amitriptyline (ELAVIL) 10 MG tablet Take 10-30 mg by mouth at bedtime. For sleep      . gabapentin (NEURONTIN) 300 MG capsule Take 900-1,200 mg by mouth 3 (three) times daily. Take 3 capsule in the morning and 4 capsule at night       . HYDROcodone-acetaminophen (NORCO) 7.5-325 MG per tablet Take 1 tablet by mouth 4 (four) times daily.      Marland Kitchen HYDROcodone-acetaminophen (NORCO/VICODIN) 5-325 MG per tablet Take 2 tablets by mouth every 4 (four) hours as needed for pain.  6 tablet  0  . levETIRAcetam (KEPPRA) 750 MG tablet Take 1,500 mg by mouth 2 (two) times daily.      . promethazine (PHENERGAN) 25 MG tablet Take 1 tablet (25 mg total) by mouth every 6 (six) hours as needed for nausea.  12 tablet  0  . tiZANidine (ZANAFLEX) 2 MG tablet Take 2 mg by mouth every 6 (six) hours as needed (for muscle spasms).      Marland Kitchen ibuprofen (ADVIL,MOTRIN) 800 MG tablet Take 800 mg by mouth every 8 (eight) hours as needed for pain.       No current facility-administered medications for this visit.    Review of Systems Review of Systems  HENT: Negative.   Respiratory: Negative.   Cardiovascular: Negative.   Endocrine: Negative.   Neurological: Negative.   Hematological: Negative.     Blood pressure 92/72, pulse 101, temperature 96.7 F (35.9 C), temperature source Temporal, height 5\' 1"  (1.549 m), weight 107 lb 6.4 oz (  48.716 kg), SpO2 98.00%.  Physical Exam Physical Exam  Constitutional: She is oriented to person, place, and time. She appears well-developed and well-nourished.  HENT:  Head: Normocephalic and atraumatic.  Eyes: Conjunctivae and EOM are normal. Pupils are equal, round, and reactive to light.  Neck: Normal range of motion. Neck supple.  Cardiovascular: Normal rate, regular rhythm and normal heart sounds.   Pulmonary/Chest: Effort normal and breath sounds normal.  Abdominal: Soft. Bowel sounds are normal. She exhibits no mass. There is tenderness. There is no rebound and no guarding.  Musculoskeletal: Normal range of motion.  Neurological: She is alert and oriented to person, place, and time.    Data Reviewed CT scan reveals cholelithiasis.  Assessment    Patient is a 42 year old female with chronic back pain,  and symptomatic cholelithiasis.     Plan    1. We'll proceed to the operating room for laparoscopic cholecystectomy with intraoperative cholangiogram. 2. All risks and benefits were discussed with the patient to generally include: infection, bleeding, possible need for post op ERCP, damage to the bile ducts, and bile leak. Alternatives were offered and described.  All questions were answered and the patient voiced understanding of the procedure and wishes to proceed at this point with a laparoscopic cholecystectomy         Marigene Ehlers., Adit Riddles 11/06/2012, 3:01 PM

## 2012-11-09 ENCOUNTER — Encounter (HOSPITAL_COMMUNITY): Payer: Self-pay | Admitting: Pharmacy Technician

## 2012-11-12 ENCOUNTER — Ambulatory Visit (INDEPENDENT_AMBULATORY_CARE_PROVIDER_SITE_OTHER): Payer: Medicaid Other | Admitting: General Surgery

## 2012-11-12 ENCOUNTER — Encounter (HOSPITAL_COMMUNITY): Payer: Self-pay

## 2012-11-12 ENCOUNTER — Encounter (HOSPITAL_COMMUNITY)
Admission: RE | Admit: 2012-11-12 | Discharge: 2012-11-12 | Disposition: A | Discharge status: Home / Self Care | Payer: Medicaid Other | Source: Ambulatory Visit | Attending: General Surgery | Admitting: General Surgery

## 2012-11-12 HISTORY — DX: Unspecified ovarian cyst, unspecified side: N83.209

## 2012-11-12 LAB — SURGICAL PCR SCREEN
MRSA, PCR: NEGATIVE
Staphylococcus aureus: NEGATIVE

## 2012-11-12 LAB — CBC
MCHC: 35 g/dL (ref 30.0–36.0)
MCV: 91.7 fL (ref 78.0–100.0)
Platelets: 267 10*3/uL (ref 150–400)
RDW: 12 % (ref 11.5–15.5)
WBC: 5.5 10*3/uL (ref 4.0–10.5)

## 2012-11-12 LAB — BASIC METABOLIC PANEL
Calcium: 8.7 mg/dL (ref 8.4–10.5)
Creatinine, Ser: 0.68 mg/dL (ref 0.50–1.10)
GFR calc Af Amer: >90 mL/min (ref >90)

## 2012-11-12 NOTE — Pre-Procedure Instructions (Signed)
Sharon Welch  11/12/2012   Your procedure is scheduled on:  May 27  Report to [facility] at 07:30 AM.  Call this number if you have problems the morning of surgery: [phone]   Remember:   Do not eat food or drink liquids after midnight.   Take these medicines the morning of surgery with A SIP OF WATER: Gabapentin, Keppra, Promethazine (if needed), Norco (if needed)    STOP Ibuprofen today  Do not wear jewelry, make-up or nail polish.  Do not wear lotions, powders, or perfumes. You may wear deodorant.  Do not shave 48 hours prior to surgery. Men may shave face and neck.  Do not bring valuables to the hospital.  Contacts, dentures or bridgework may not be worn into surgery.  Leave suitcase in the car. After surgery it may be brought to your room.  For patients admitted to the hospital, checkout time is 11:00 AM the day of discharge.   Patients discharged the day of surgery will not be allowed to drive home.  Name and phone number of your driver: Family/ friend  Special Instructions: Shower using CHG 2 nights before surgery and the night before surgery.  If you shower the day of surgery use CHG.  Use special wash - you have one bottle of CHG for all showers.  You should use approximately 1/3 of the bottle for each shower.   Please read over the following fact sheets that you were given: Pain Booklet, Coughing and Deep Breathing and Surgical Site Infection Prevention

## 2012-11-16 MED ORDER — CEFAZOLIN SODIUM-DEXTROSE 2-3 GM-% IV SOLR
2.0000 g | INTRAVENOUS | Status: AC
  Administered 2012-11-17: 2 g via INTRAVENOUS
  Filled 2012-11-16: qty 50

## 2012-11-16 MED ORDER — CHLORHEXIDINE GLUCONATE 4 % EX LIQD: 1.0000 "application " | Freq: Once | CUTANEOUS | Status: DC

## 2012-11-17 ENCOUNTER — Ambulatory Visit (HOSPITAL_COMMUNITY)
Admission: RE | Admit: 2012-11-17 | Discharge: 2012-11-17 | Disposition: A | Discharge status: Home / Self Care | Payer: Medicaid Other | Source: Ambulatory Visit | Attending: General Surgery | Admitting: General Surgery

## 2012-11-17 ENCOUNTER — Ambulatory Visit (HOSPITAL_COMMUNITY): Payer: Medicaid Other

## 2012-11-17 ENCOUNTER — Encounter (HOSPITAL_COMMUNITY): Payer: Self-pay | Admitting: Critical Care Medicine

## 2012-11-17 ENCOUNTER — Encounter (HOSPITAL_COMMUNITY)
Admission: RE | Disposition: A | Discharge status: Home / Self Care | Payer: Self-pay | Source: Ambulatory Visit | Attending: General Surgery

## 2012-11-17 ENCOUNTER — Ambulatory Visit (HOSPITAL_COMMUNITY): Payer: Medicaid Other | Admitting: Critical Care Medicine

## 2012-11-17 DIAGNOSIS — M4120 Other idiopathic scoliosis, site unspecified: Secondary | ICD-10-CM | POA: Insufficient documentation

## 2012-11-17 DIAGNOSIS — Z79899 Other long term (current) drug therapy: Secondary | ICD-10-CM | POA: Insufficient documentation

## 2012-11-17 DIAGNOSIS — K838 Other specified diseases of biliary tract: Secondary | ICD-10-CM | POA: Insufficient documentation

## 2012-11-17 DIAGNOSIS — M549 Dorsalgia, unspecified: Secondary | ICD-10-CM | POA: Insufficient documentation

## 2012-11-17 DIAGNOSIS — K8020 Calculus of gallbladder without cholecystitis without obstruction: Secondary | ICD-10-CM | POA: Insufficient documentation

## 2012-11-17 DIAGNOSIS — G8929 Other chronic pain: Secondary | ICD-10-CM | POA: Insufficient documentation

## 2012-11-17 DIAGNOSIS — K8000 Calculus of gallbladder with acute cholecystitis without obstruction: Secondary | ICD-10-CM

## 2012-11-17 DIAGNOSIS — R569 Unspecified convulsions: Secondary | ICD-10-CM | POA: Insufficient documentation

## 2012-11-17 DIAGNOSIS — F17200 Nicotine dependence, unspecified, uncomplicated: Secondary | ICD-10-CM | POA: Insufficient documentation

## 2012-11-17 HISTORY — PX: CHOLECYSTECTOMY: SHX55

## 2012-11-17 SURGERY — LAPAROSCOPIC CHOLECYSTECTOMY WITH INTRAOPERATIVE CHOLANGIOGRAM
Anesthesia: General | Site: Abdomen | Wound class: Clean Contaminated

## 2012-11-17 MED ORDER — ONDANSETRON HCL 4 MG/2ML IJ SOLN: 4.0000 mg | Freq: Four times a day (QID) | INTRAMUSCULAR | Status: DC | PRN

## 2012-11-17 MED ORDER — SODIUM CHLORIDE 0.9 % IJ SOLN: 3.0000 mL | INTRAMUSCULAR | Status: DC | PRN

## 2012-11-17 MED ORDER — MIDAZOLAM HCL 5 MG/5ML IJ SOLN
INTRAMUSCULAR | Status: DC | PRN
  Administered 2012-11-17: 2 mg via INTRAVENOUS

## 2012-11-17 MED ORDER — HYDROMORPHONE HCL PF 1 MG/ML IJ SOLN
0.2500 mg | INTRAMUSCULAR | Status: DC | PRN
  Administered 2012-11-17: 0.5 mg via INTRAVENOUS
  Administered 2012-11-17: 0.25 mg via INTRAVENOUS
  Administered 2012-11-17 (×2): 0.5 mg via INTRAVENOUS
  Administered 2012-11-17: 0.25 mg via INTRAVENOUS

## 2012-11-17 MED ORDER — FENTANYL CITRATE 0.05 MG/ML IJ SOLN
INTRAMUSCULAR | Status: DC | PRN
  Administered 2012-11-17 (×3): 50 ug via INTRAVENOUS
  Administered 2012-11-17: 100 ug via INTRAVENOUS

## 2012-11-17 MED ORDER — PROPOFOL 10 MG/ML IV BOLUS
INTRAVENOUS | Status: DC | PRN
  Administered 2012-11-17: 140 mg via INTRAVENOUS

## 2012-11-17 MED ORDER — BUPIVACAINE HCL (PF) 0.25 % IJ SOLN
INTRAMUSCULAR | Status: AC
  Filled 2012-11-17: qty 30

## 2012-11-17 MED ORDER — OXYCODONE-ACETAMINOPHEN 10-325 MG PO TABS: 1.0000 | ORAL_TABLET | ORAL | Status: DC | PRN

## 2012-11-17 MED ORDER — SODIUM CHLORIDE 0.9 % IV SOLN: 250.0000 mL | INTRAVENOUS | Status: DC | PRN

## 2012-11-17 MED ORDER — HYDROMORPHONE HCL PF 1 MG/ML IJ SOLN
INTRAMUSCULAR | Status: AC
  Filled 2012-11-17: qty 1

## 2012-11-17 MED ORDER — LIDOCAINE HCL (CARDIAC) 20 MG/ML IV SOLN
INTRAVENOUS | Status: DC | PRN
  Administered 2012-11-17: 50 mg via INTRAVENOUS

## 2012-11-17 MED ORDER — OXYCODONE HCL 5 MG PO TABS: 5.0000 mg | ORAL_TABLET | ORAL | Status: DC | PRN

## 2012-11-17 MED ORDER — PHENYLEPHRINE HCL 10 MG/ML IJ SOLN
INTRAMUSCULAR | Status: DC | PRN
  Administered 2012-11-17: 40 ug via INTRAVENOUS

## 2012-11-17 MED ORDER — SODIUM CHLORIDE 0.9 % IR SOLN
Status: DC | PRN
  Administered 2012-11-17: 1000 mL

## 2012-11-17 MED ORDER — LACTATED RINGERS IV SOLN
INTRAVENOUS | Status: DC | PRN
  Administered 2012-11-17: 08:00:00 via INTRAVENOUS

## 2012-11-17 MED ORDER — ONDANSETRON HCL 4 MG/2ML IJ SOLN
INTRAMUSCULAR | Status: AC
  Filled 2012-11-17: qty 2

## 2012-11-17 MED ORDER — BUPIVACAINE HCL 0.25 % IJ SOLN
INTRAMUSCULAR | Status: DC | PRN
  Administered 2012-11-17: 6 mL

## 2012-11-17 MED ORDER — IOHEXOL 300 MG/ML  SOLN
INTRAMUSCULAR | Status: DC | PRN
  Administered 2012-11-17: 15 mL

## 2012-11-17 MED ORDER — ROCURONIUM BROMIDE 100 MG/10ML IV SOLN
INTRAVENOUS | Status: DC | PRN
  Administered 2012-11-17: 15 mg via INTRAVENOUS
  Administered 2012-11-17: 5 mg via INTRAVENOUS
  Administered 2012-11-17: 10 mg via INTRAVENOUS

## 2012-11-17 MED ORDER — ACETAMINOPHEN 650 MG RE SUPP: 650.0000 mg | RECTAL | Status: DC | PRN

## 2012-11-17 MED ORDER — NEOSTIGMINE METHYLSULFATE 1 MG/ML IJ SOLN
INTRAMUSCULAR | Status: DC | PRN
  Administered 2012-11-17: 3 mg via INTRAVENOUS

## 2012-11-17 MED ORDER — ONDANSETRON HCL 4 MG/2ML IJ SOLN
INTRAMUSCULAR | Status: DC | PRN
  Administered 2012-11-17: 4 mg via INTRAVENOUS

## 2012-11-17 MED ORDER — ACETAMINOPHEN 325 MG PO TABS: 650.0000 mg | ORAL_TABLET | ORAL | Status: DC | PRN

## 2012-11-17 MED ORDER — LIDOCAINE HCL 4 % MT SOLN
OROMUCOSAL | Status: DC | PRN
  Administered 2012-11-17: 4 mL via TOPICAL

## 2012-11-17 MED ORDER — ONDANSETRON HCL 4 MG/2ML IJ SOLN
4.0000 mg | Freq: Once | INTRAMUSCULAR | Status: AC | PRN
  Administered 2012-11-17: 4 mg via INTRAVENOUS

## 2012-11-17 MED ORDER — SODIUM CHLORIDE 0.9 % IJ SOLN: 3.0000 mL | Freq: Two times a day (BID) | INTRAMUSCULAR | Status: DC

## 2012-11-17 MED ORDER — GLYCOPYRROLATE 0.2 MG/ML IJ SOLN
INTRAMUSCULAR | Status: DC | PRN
  Administered 2012-11-17: 0.4 mg via INTRAVENOUS

## 2012-11-17 SURGICAL SUPPLY — 42 items
APPLIER CLIP 5 13 M/L LIGAMAX5 (MISCELLANEOUS) ×2
BENZOIN TINCTURE PRP APPL 2/3 (GAUZE/BANDAGES/DRESSINGS) ×2 IMPLANT
CANISTER SUCTION 2500CC (MISCELLANEOUS) ×2 IMPLANT
CHLORAPREP W/TINT 26ML (MISCELLANEOUS) ×2 IMPLANT
CLIP APPLIE 5 13 M/L LIGAMAX5 (MISCELLANEOUS) ×1 IMPLANT
CLOTH BEACON ORANGE TIMEOUT ST (SAFETY) ×2 IMPLANT
COVER MAYO STAND STRL (DRAPES) ×2 IMPLANT
COVER SURGICAL LIGHT HANDLE (MISCELLANEOUS) ×2 IMPLANT
COVER TRANSDUCER ULTRASND (DRAPES) IMPLANT
DEVICE TROCAR PUNCTURE CLOSURE (ENDOMECHANICALS) ×2 IMPLANT
DRAPE C-ARM 42X72 X-RAY (DRAPES) ×2 IMPLANT
DRAPE UTILITY 15X26 W/TAPE STR (DRAPE) ×4 IMPLANT
ELECT REM PT RETURN 9FT ADLT (ELECTROSURGICAL) ×2
ELECTRODE REM PT RTRN 9FT ADLT (ELECTROSURGICAL) ×1 IMPLANT
GAUZE SPONGE 2X2 8PLY STRL LF (GAUZE/BANDAGES/DRESSINGS) ×1 IMPLANT
GLOVE BIO SURGEON STRL SZ7.5 (GLOVE) ×4 IMPLANT
GLOVE BIOGEL PI IND STRL 8 (GLOVE) ×1 IMPLANT
GLOVE BIOGEL PI INDICATOR 8 (GLOVE) ×1
GOWN STRL NON-REIN LRG LVL3 (GOWN DISPOSABLE) ×6 IMPLANT
GOWN STRL REIN XL XLG (GOWN DISPOSABLE) ×2 IMPLANT
IV CATH 14GX2 1/4 (CATHETERS) ×2 IMPLANT
KIT BASIN OR (CUSTOM PROCEDURE TRAY) ×2 IMPLANT
KIT ROOM TURNOVER OR (KITS) ×2 IMPLANT
NEEDLE INSUFFLATION 14GA 120MM (NEEDLE) ×2 IMPLANT
NS IRRIG 1000ML POUR BTL (IV SOLUTION) ×2 IMPLANT
PAD ARMBOARD 7.5X6 YLW CONV (MISCELLANEOUS) ×4 IMPLANT
POUCH SPECIMEN RETRIEVAL 10MM (ENDOMECHANICALS) IMPLANT
SCISSORS LAP 5X35 DISP (ENDOMECHANICALS) ×2 IMPLANT
SET CHOLANGIOGRAPHY FRANKLIN (SET/KITS/TRAYS/PACK) ×2 IMPLANT
SET IRRIG TUBING LAPAROSCOPIC (IRRIGATION / IRRIGATOR) ×2 IMPLANT
SLEEVE ENDOPATH XCEL 5M (ENDOMECHANICALS) ×2 IMPLANT
SPECIMEN JAR SMALL (MISCELLANEOUS) ×2 IMPLANT
SPONGE GAUZE 2X2 STER 10/PKG (GAUZE/BANDAGES/DRESSINGS) ×1
SPONGE GAUZE 4X4 12PLY (GAUZE/BANDAGES/DRESSINGS) ×2 IMPLANT
STRIP CLOSURE SKIN 1/2X4 (GAUZE/BANDAGES/DRESSINGS) ×2 IMPLANT
SUT MNCRL AB 3-0 PS2 18 (SUTURE) ×2 IMPLANT
TAPE CLOTH SURG 4X10 WHT LF (GAUZE/BANDAGES/DRESSINGS) ×2 IMPLANT
TOWEL OR 17X24 6PK STRL BLUE (TOWEL DISPOSABLE) ×2 IMPLANT
TOWEL OR 17X26 10 PK STRL BLUE (TOWEL DISPOSABLE) ×2 IMPLANT
TRAY LAPAROSCOPIC (CUSTOM PROCEDURE TRAY) ×2 IMPLANT
TROCAR XCEL NON-BLD 11X100MML (ENDOMECHANICALS) ×2 IMPLANT
TROCAR XCEL NON-BLD 5MMX100MML (ENDOMECHANICALS) ×2 IMPLANT

## 2012-11-17 NOTE — Progress Notes (Signed)
Patient with new drainage to umbilical site

## 2012-11-17 NOTE — Preoperative (Signed)
Beta Blockers   Reason not to administer Beta Blockers:Not Applicable 

## 2012-11-17 NOTE — Transfer of Care (Signed)
Immediate Anesthesia Transfer of Care Note  Patient: Sharon Welch  Procedure(s) Performed: Procedure(s): LAPAROSCOPIC CHOLECYSTECTOMY WITH INTRAOPERATIVE CHOLANGIOGRAM (N/A)  Patient Location: PACU  Anesthesia Type:General  Level of Consciousness: awake, alert  and oriented  Airway & Oxygen Therapy: Patient Spontanous Breathing and Patient connected to nasal cannula oxygen  Post-op Assessment: Report given to PACU RN, Post -op Vital signs reviewed and stable and Patient moving all extremities X 4  Post vital signs: Reviewed and stable  Complications: No apparent anesthesia complications

## 2012-11-17 NOTE — Anesthesia Procedure Notes (Signed)
Procedure Name: Intubation Date/Time: 11/17/2012 8:33 AM Performed by: Elon Alas Pre-anesthesia Checklist: Patient identified, Timeout performed, Emergency Drugs available, Suction available and Patient being monitored Patient Re-evaluated:Patient Re-evaluated prior to inductionOxygen Delivery Method: Circle system utilized Preoxygenation: Pre-oxygenation with 100% oxygen Intubation Type: IV induction Ventilation: Mask ventilation without difficulty Laryngoscope Size: Mac and 4 Grade View: Grade I Tube type: Oral Tube size: 7.0 mm Number of attempts: 1 Airway Equipment and Method: Stylet and LTA kit utilized Placement Confirmation: positive ETCO2,  ETT inserted through vocal cords under direct vision and breath sounds checked- equal and bilateral Secured at: 20 cm Tube secured with: Tape Dental Injury: Teeth and Oropharynx as per pre-operative assessment

## 2012-11-17 NOTE — Progress Notes (Signed)
Patient has consumed ~ 60- 80 mL of coke without recurrent nausea. Patient reports that she is starting to feel better. Reports that she is starting to feel need to void tech will assist patient to bathroom.

## 2012-11-17 NOTE — Op Note (Signed)
Pre Operative Diagnosis:  cholelithiasis  Post Operative Diagnosis: same  Procedure: Lap chole with IOC  Surgeon: Dr. Axel Filler  Assistant: none  Anesthesia: GETA  EBL: <5cc  Complications: none  Counts: reported as correct x 2  Findings:  Pt had a normal IOC.    Indications for procedure:  Pt is a 42 y/o F with h/o of epigastric abd pain and cholelithiasis and ? Choledocholithiasis.  Pt was seen in clinic and counciled and decided to have this electively removed.  Details of the procedure:  The patient was taken to the operating and placed in the supine position with bilateral SCDs in place. A time out was called and all facts were verified. A pneumoperitoneum was obtained via A Veress needle technique to a pressure of 14mm of mercury. A 5mm trochar was then placed in the right upper quadrant under visualization, and there were no injuries to any abdominal organs. A 11 mm port was then placed in the umbilical region after infiltrating with local anesthesia under direct visualization. A second epigastric port was placed under direct visualization. The gallbladder was identified and retracted, the peritoneum was then sharply dissected from the gallbladder and this dissection was carried down to Calot's triangle. The cystic duct was identified and stripped away circumferentially and seen going into the gallbladder 360. A Cook catheter was used to perform an intraoperative cholangiogram. The  cystic duct and common bile duct were seen free of filling defects.  The biliary radicals were not opacified.  2 clips were placed distall one proximally and the cystic duct transected. The cystic artery was identified and 2 clips placed proximally and one distally and transected.  We then proceeded to remove the gallbladder off the hepatic fossa with Bovie cautery. A latex retrieval bag was then placed in the abdomen and gallbladder placed in the bag. The hepatic fossa was then reexamined and  hemostasis was achieved with laparoscopic specula and Bovie cautery and was excellent at this portion of the case.  The subhepatic fossa and perihepatic fossa was then irrigated until the effluent was clear. The 11 mm trocar fascia was reapproximated with the Endo Close #1 Vicryl x2.  The pneumoperitoneum was evacuated and all trochars removed under direct visulalization.  The skin was then closed with 4-0 Monocryl and the skin dressed with Steri-Strips, gauze, and tape.  The patient was awaken from general anesthesia and taken to the recovery room in stable condition.

## 2012-11-17 NOTE — Anesthesia Preprocedure Evaluation (Addendum)
Anesthesia Evaluation  Patient identified by MRN, date of birth, ID band Patient awake    Reviewed: Allergy & Precautions, H&P , NPO status , Patient's Chart, lab work & pertinent test results  Airway Mallampati: II TM Distance: >3 FB Neck ROM: Full    Dental  (+) Dental Advisory Given and Teeth Intact   Pulmonary Current Smoker,          Cardiovascular Rhythm:regular Rate:Normal     Neuro/Psych  Headaches, Seizures -,     GI/Hepatic   Endo/Other    Renal/GU      Musculoskeletal   Abdominal   Peds  Hematology   Anesthesia Other Findings   Reproductive/Obstetrics                         Anesthesia Physical Anesthesia Plan  ASA: II  Anesthesia Plan: General   Post-op Pain Management:    Induction: Intravenous  Airway Management Planned: Oral ETT  Additional Equipment:   Intra-op Plan:   Post-operative Plan: Extubation in OR  Informed Consent: I have reviewed the patients History and Physical, chart, labs and discussed the procedure including the risks, benefits and alternatives for the proposed anesthesia with the patient or authorized representative who has indicated his/her understanding and acceptance.   Dental advisory given  Plan Discussed with: Anesthesiologist and Surgeon  Anesthesia Plan Comments:        Anesthesia Quick Evaluation

## 2012-11-17 NOTE — Anesthesia Postprocedure Evaluation (Signed)
  Anesthesia Post-op Note  Patient: Sharon Welch  Procedure(s) Performed: Procedure(s): LAPAROSCOPIC CHOLECYSTECTOMY WITH INTRAOPERATIVE CHOLANGIOGRAM (N/A)  Patient Location: PACU  Anesthesia Type:General  Level of Consciousness: awake, alert , oriented and patient cooperative  Airway and Oxygen Therapy: Patient Spontanous Breathing  Post-op Pain: mild  Post-op Assessment: Post-op Vital signs reviewed, Patient's Cardiovascular Status Stable, Respiratory Function Stable, Patent Airway, No signs of Nausea or vomiting and Pain level controlled  Post-op Vital Signs: stable  Complications: No apparent anesthesia complications

## 2012-11-17 NOTE — Progress Notes (Signed)
Patient arrived from PACU Alert and oriented with pain to epigastric region reports that it feels like gas pain. Dressing sites as noted. Patient reports small amount of nausea. Patient given coke to drink. Patient educated on discharge criteria. Will continue to monitor.

## 2012-11-17 NOTE — Interval H&P Note (Signed)
History and Physical Interval Note:  11/17/2012 8:22 AM  Sharon Welch  has presented today for surgery, with the diagnosis of gallstones  The various methods of treatment have been discussed with the patient and family. After consideration of risks, benefits and other options for treatment, the patient has consented to  Procedure(s): LAPAROSCOPIC CHOLECYSTECTOMY WITH INTRAOPERATIVE CHOLANGIOGRAM (N/A) as a surgical intervention .  The patient's history has been reviewed, patient examined, no change in status, stable for surgery.  I have reviewed the patient's chart and labs.  Questions were answered to the patient's satisfaction.     Marigene Ehlers., Jed Limerick

## 2012-11-17 NOTE — Progress Notes (Signed)
Patient reports that drinking coke is helping with nausea and reports that she is burping which is also helping.

## 2012-11-17 NOTE — H&P (View-Only) (Signed)
Patient ID: Sharon Welch , female   DOB: 10/09/1970, 41 y.o.   MRN: [id number]  Chief Complaint  Patient presents with  . New Evaluation    eval gallstones    HPI Sharon Welch  is a 41 y.o. female.  The patient is a 40-year-old female who was previously seen in the ER for evaluation of epigastric pain. Patient was seen to have cholelithiasis and dilated common bile duct. Patient was referred to Dr. Hung who underwent ERCP. There is no choledocholithiasis. Patient was secondarily referred for a laparoscopic cholecystectomy.  HPI  Past Medical History  Diagnosis Date  . Scoliosis   . Chronic back pain   . Seizures     started 9/12-  . Headache   . Chronic back pain   . Partial tear subscapularis tendon 12/27/2011    Past Surgical History  Procedure Laterality Date  . Back surgery  1989    scoliosis throsic-rods  . Tubal ligation    . Cesarean section    . Abdominal hysterectomy  2012  . Left arm    . Eus N/A 11/03/2012    Procedure: UPPER ENDOSCOPIC ULTRASOUND (EUS) LINEAR;  Surgeon: Patrick D Hung, MD;  Location: [facility];  Service: Endoscopy;  Laterality: N/A;    Family History  Problem Relation Age of Onset  . Hypertension Mother   . Hypertension Father   . Cancer Father     prostate  . Heart disease Father   . Hypertension Brother   . Hypertension Brother     Social History History  Substance Use Topics  . Smoking status: Current Every Day Smoker -- 1.00 packs/day for 20 years    Types: Cigarettes  . Smokeless tobacco: Never Used  . Alcohol Use: Yes     Comment: rare    Allergies  Allergen Reactions  . Tramadol Other (See Comments)    Possible seizures    Current Outpatient Prescriptions  Medication Sig Dispense Refill  . amitriptyline (ELAVIL) 10 MG tablet Take 10-30 mg by mouth at bedtime. For sleep      . gabapentin (NEURONTIN) 300 MG capsule Take 900-1,200 mg by mouth 3 (three) times daily. Take 3 capsule in the morning and 4 capsule at night       . HYDROcodone-acetaminophen (NORCO) 7.5-325 MG per tablet Take 1 tablet by mouth 4 (four) times daily.      . HYDROcodone-acetaminophen (NORCO/VICODIN) 5-325 MG per tablet Take 2 tablets by mouth every 4 (four) hours as needed for pain.  6 tablet  0  . levETIRAcetam (KEPPRA) 750 MG tablet Take 1,500 mg by mouth 2 (two) times daily.      . promethazine (PHENERGAN) 25 MG tablet Take 1 tablet (25 mg total) by mouth every 6 (six) hours as needed for nausea.  12 tablet  0  . tiZANidine (ZANAFLEX) 2 MG tablet Take 2 mg by mouth every 6 (six) hours as needed (for muscle spasms).      . ibuprofen (ADVIL,MOTRIN) 800 MG tablet Take 800 mg by mouth every 8 (eight) hours as needed for pain.       No current facility-administered medications for this visit.    Review of Systems Review of Systems  HENT: Negative.   Respiratory: Negative.   Cardiovascular: Negative.   Endocrine: Negative.   Neurological: Negative.   Hematological: Negative.     Blood pressure 92/72, pulse 101, temperature 96.7 F (35.9 C), temperature source Temporal, height 5' 1" (1.549 m), weight 107 lb 6.4 oz (  48.716 kg), SpO2 98.00%.  Physical Exam Physical Exam  Constitutional: She is oriented to person, place, and time. She appears well-developed and well-nourished.  HENT:  Head: Normocephalic and atraumatic.  Eyes: Conjunctivae and EOM are normal. Pupils are equal, round, and reactive to light.  Neck: Normal range of motion. Neck supple.  Cardiovascular: Normal rate, regular rhythm and normal heart sounds.   Pulmonary/Chest: Effort normal and breath sounds normal.  Abdominal: Soft. Bowel sounds are normal. She exhibits no mass. There is tenderness. There is no rebound and no guarding.  Musculoskeletal: Normal range of motion.  Neurological: She is alert and oriented to person, place, and time.    Data Reviewed CT scan reveals cholelithiasis.  Assessment    Patient is a 41-year-old female with chronic back pain,  and symptomatic cholelithiasis.     Plan    1. We'll proceed to the operating room for laparoscopic cholecystectomy with intraoperative cholangiogram. 2. All risks and benefits were discussed with the patient to generally include: infection, bleeding, possible need for post op ERCP, damage to the bile ducts, and bile leak. Alternatives were offered and described.  All questions were answered and the patient voiced understanding of the procedure and wishes to proceed at this point with a laparoscopic cholecystectomy         Ulysses Alper Jr., Sharon Welch 11/06/2012, 3:01 PM    

## 2012-11-18 ENCOUNTER — Encounter (HOSPITAL_COMMUNITY): Payer: Self-pay | Admitting: General Surgery

## 2012-11-19 ENCOUNTER — Telehealth (INDEPENDENT_AMBULATORY_CARE_PROVIDER_SITE_OTHER): Payer: Self-pay | Admitting: General Surgery

## 2012-11-19 NOTE — Telephone Encounter (Signed)
Spoke with patient she is aware of poi f/u appt fo r6/24/14 at 400she requested this day.

## 2012-11-30 ENCOUNTER — Encounter (INDEPENDENT_AMBULATORY_CARE_PROVIDER_SITE_OTHER): Payer: Self-pay

## 2012-12-03 ENCOUNTER — Ambulatory Visit: Payer: Self-pay | Admitting: Pain Medicine

## 2012-12-07 ENCOUNTER — Encounter (INDEPENDENT_AMBULATORY_CARE_PROVIDER_SITE_OTHER): Payer: Medicaid Other | Admitting: General Surgery

## 2012-12-14 ENCOUNTER — Ambulatory Visit: Payer: Self-pay | Admitting: Pain Medicine

## 2012-12-15 ENCOUNTER — Encounter (INDEPENDENT_AMBULATORY_CARE_PROVIDER_SITE_OTHER): Payer: Self-pay | Admitting: General Surgery

## 2012-12-15 ENCOUNTER — Ambulatory Visit (INDEPENDENT_AMBULATORY_CARE_PROVIDER_SITE_OTHER): Payer: Medicaid Other | Admitting: General Surgery

## 2012-12-15 VITALS — BP 116/70 | HR 70 | Temp 98.6°F | Resp 15 | Ht 61.0 in | Wt 105.4 lb

## 2012-12-15 DIAGNOSIS — Z9049 Acquired absence of other specified parts of digestive tract: Secondary | ICD-10-CM

## 2012-12-15 DIAGNOSIS — Z9889 Other specified postprocedural states: Secondary | ICD-10-CM

## 2012-12-15 NOTE — Progress Notes (Signed)
Patient ID: Sharon Welch, female   DOB: 14-Apr-1971, 42 y.o.   MRN: [id number] The patient is a 42 year old female status post laparoscopic cholecystectomy. Patient has been doing well postoperatively her preoperative pain this has resolved.  On exam: Her wounds are clean dry and intact.  Pathological reveals cholelithiasis. Next number seen. This was discussed with the patient.  Assessment and plan: 42 year old female status post laparoscopic cholecystectomy. 1. We discussed weightlifting restrictions for a total of 6 weeks from surgery. 2. Patient to follow up when necessary

## 2013-01-05 ENCOUNTER — Ambulatory Visit: Payer: Self-pay | Admitting: Pain Medicine

## 2013-01-11 ENCOUNTER — Ambulatory Visit: Payer: Self-pay | Admitting: Pain Medicine

## 2013-02-04 ENCOUNTER — Ambulatory Visit: Payer: Self-pay | Admitting: Pain Medicine

## 2013-03-04 ENCOUNTER — Ambulatory Visit: Payer: Self-pay | Admitting: Pain Medicine

## 2013-03-30 ENCOUNTER — Ambulatory Visit: Payer: Self-pay | Admitting: Pain Medicine

## 2013-04-02 ENCOUNTER — Ambulatory Visit
Admission: RE | Admit: 2013-04-02 | Discharge: 2013-04-02 | Disposition: A | Discharge status: Home / Self Care | Payer: Medicaid Other | Source: Ambulatory Visit | Attending: Family Medicine | Admitting: Family Medicine

## 2013-04-02 ENCOUNTER — Other Ambulatory Visit: Payer: Self-pay | Admitting: Family Medicine

## 2013-04-02 DIAGNOSIS — R05 Cough: Secondary | ICD-10-CM

## 2013-04-02 DIAGNOSIS — R000 Tachycardia, unspecified: Secondary | ICD-10-CM

## 2013-04-08 ENCOUNTER — Other Ambulatory Visit: Payer: Self-pay | Admitting: Family Medicine

## 2013-04-08 DIAGNOSIS — R7989 Other specified abnormal findings of blood chemistry: Secondary | ICD-10-CM

## 2013-04-09 ENCOUNTER — Ambulatory Visit
Admission: RE | Admit: 2013-04-09 | Discharge: 2013-04-09 | Disposition: A | Discharge status: Home / Self Care | Payer: Medicaid Other | Source: Ambulatory Visit | Attending: Family Medicine | Admitting: Family Medicine

## 2013-04-09 DIAGNOSIS — R7989 Other specified abnormal findings of blood chemistry: Secondary | ICD-10-CM

## 2013-04-29 ENCOUNTER — Ambulatory Visit: Payer: Self-pay | Admitting: Pain Medicine

## 2013-05-03 ENCOUNTER — Other Ambulatory Visit: Payer: Self-pay | Admitting: Gastroenterology

## 2013-05-06 NOTE — H&P (Signed)
   Sharon Welch HPI: The patient was being evaluated for tachycardia with Dr. Manus Welch and during the work up she was noted to have abnormal liver enzymes. In fact, this is the first time that she is noted to have abnormal liver enzymes. In May this year she underwent an EUS with findings of a mildly dilated CBD at 8 mm, but no choledocholithiasis. She was then treated by Dr. Derrell Welch with a cholecystectomy and the IOC was negative. Her latest liver enzymes are in the 100 range and she does comlain of some epigastic pain that is reminiscent to her gallbladder pain. Gallstones were identified in her gallbladder. The RUQ U/S reveals a dilated duct at 1.2 cm.  Past Medical History  Diagnosis Date  . Scoliosis   . Chronic back pain   . Seizures     started 9/12-  . Headache(784.0)   . Chronic back pain   . Partial tear subscapularis tendon 12/27/2011  . Ovarian cyst     Past Surgical History  Procedure Laterality Date  . Back surgery  1989    scoliosis throsic-rods  . Tubal ligation    . Cesarean section    . Abdominal hysterectomy  2012  . Left arm    . Eus N/A 11/03/2012    Procedure: UPPER ENDOSCOPIC ULTRASOUND (EUS) LINEAR;  Surgeon: Sharon Belfast, MD;  Location: [facility];  Service: Endoscopy;  Laterality: N/A;  . Cholecystectomy N/A 11/17/2012    Procedure: LAPAROSCOPIC CHOLECYSTECTOMY WITH INTRAOPERATIVE CHOLANGIOGRAM;  Surgeon: Sharon Filler, MD;  Location: [facility];  Service: General;  Laterality: N/A;    Family History  Problem Relation Age of Onset  . Hypertension Mother   . Hypertension Father   . Cancer Father     prostate  . Heart disease Father   . Hypertension Brother   . Hypertension Brother     Social History:  reports that she has been smoking Cigarettes.  She has a 15 pack-year smoking history. She has never used smokeless tobacco. She reports that she drinks alcohol. She reports that she does not use illicit drugs.  Allergies:  Allergies  Allergen Reactions   . Tramadol Other (See Comments)    Possible seizures    Medications:  Scheduled: . ciprofloxacin  400 mg Intravenous Q12H   Continuous: . sodium chloride      No results found for this or any previous visit (from the past 24 hour(s)).   No results found.  ROS:  As stated above in the HPI otherwise negative.  There were no vitals taken for this visit.    PE: Gen: NAD, Alert and Oriented HEENT:  [name]/AT, EOMI Neck: Supple, no LAD Lungs: CTA Bilaterally CV: RRR without M/G/R ABM: Soft, NTND, +BS Ext: No C/C/E  Assessment/Plan: 1) Abnormal CT scan. 2) Elevated liver enzymes.  Plan: 1) EUS/ERCP.  Sharon Welch D 05/06/2013, 11:03 AM

## 2013-05-07 ENCOUNTER — Ambulatory Visit (HOSPITAL_COMMUNITY)
Admission: RE | Admit: 2013-05-07 | Discharge: 2013-05-07 | Disposition: A | Discharge status: Home / Self Care | Payer: Medicaid Other | Source: Ambulatory Visit | Attending: Gastroenterology | Admitting: Gastroenterology

## 2013-05-07 ENCOUNTER — Encounter (HOSPITAL_COMMUNITY): Payer: Self-pay | Admitting: *Deleted

## 2013-05-07 ENCOUNTER — Encounter (HOSPITAL_COMMUNITY)
Admission: RE | Disposition: A | Discharge status: Home / Self Care | Payer: Self-pay | Source: Ambulatory Visit | Attending: Gastroenterology

## 2013-05-07 ENCOUNTER — Encounter (HOSPITAL_COMMUNITY): Payer: Self-pay | Admitting: Emergency Medicine

## 2013-05-07 ENCOUNTER — Emergency Department (HOSPITAL_COMMUNITY): Payer: Medicaid Other

## 2013-05-07 ENCOUNTER — Inpatient Hospital Stay (HOSPITAL_COMMUNITY)
Admission: EM | Admit: 2013-05-07 | Discharge: 2013-05-10 | DRG: 440 | Disposition: A | Discharge status: Home / Self Care | Payer: Medicaid Other | Attending: Internal Medicine | Admitting: Internal Medicine

## 2013-05-07 ENCOUNTER — Ambulatory Visit (HOSPITAL_COMMUNITY): Payer: Medicaid Other

## 2013-05-07 DIAGNOSIS — R000 Tachycardia, unspecified: Secondary | ICD-10-CM | POA: Insufficient documentation

## 2013-05-07 DIAGNOSIS — G8929 Other chronic pain: Secondary | ICD-10-CM | POA: Diagnosis present

## 2013-05-07 DIAGNOSIS — R748 Abnormal levels of other serum enzymes: Secondary | ICD-10-CM | POA: Insufficient documentation

## 2013-05-07 DIAGNOSIS — K859 Acute pancreatitis without necrosis or infection, unspecified: Principal | ICD-10-CM | POA: Diagnosis present

## 2013-05-07 DIAGNOSIS — R109 Unspecified abdominal pain: Secondary | ICD-10-CM

## 2013-05-07 DIAGNOSIS — R569 Unspecified convulsions: Secondary | ICD-10-CM | POA: Diagnosis present

## 2013-05-07 DIAGNOSIS — F17200 Nicotine dependence, unspecified, uncomplicated: Secondary | ICD-10-CM | POA: Diagnosis present

## 2013-05-07 DIAGNOSIS — K838 Other specified diseases of biliary tract: Secondary | ICD-10-CM | POA: Insufficient documentation

## 2013-05-07 DIAGNOSIS — Z8249 Family history of ischemic heart disease and other diseases of the circulatory system: Secondary | ICD-10-CM

## 2013-05-07 DIAGNOSIS — Z79899 Other long term (current) drug therapy: Secondary | ICD-10-CM

## 2013-05-07 DIAGNOSIS — R1013 Epigastric pain: Secondary | ICD-10-CM | POA: Insufficient documentation

## 2013-05-07 DIAGNOSIS — Z8042 Family history of malignant neoplasm of prostate: Secondary | ICD-10-CM

## 2013-05-07 DIAGNOSIS — E876 Hypokalemia: Secondary | ICD-10-CM | POA: Diagnosis present

## 2013-05-07 DIAGNOSIS — M4120 Other idiopathic scoliosis, site unspecified: Secondary | ICD-10-CM | POA: Diagnosis present

## 2013-05-07 DIAGNOSIS — M549 Dorsalgia, unspecified: Secondary | ICD-10-CM | POA: Diagnosis present

## 2013-05-07 DIAGNOSIS — S4380XS Sprain of other specified parts of unspecified shoulder girdle, sequela: Secondary | ICD-10-CM

## 2013-05-07 DIAGNOSIS — Y849 Medical procedure, unspecified as the cause of abnormal reaction of the patient, or of later complication, without mention of misadventure at the time of the procedure: Secondary | ICD-10-CM | POA: Diagnosis present

## 2013-05-07 HISTORY — PX: ERCP: SHX5425

## 2013-05-07 LAB — CBC WITH DIFFERENTIAL/PLATELET
Basophils Absolute: 0 10*3/uL (ref 0.0–0.1)
Basophils Relative: 0 % (ref 0–1)
Eosinophils Absolute: 0.1 10*3/uL (ref 0.0–0.7)
Eosinophils Relative: 1 % (ref 0–5)
HCT: 37.1 % (ref 36.0–46.0)
Hemoglobin: 13.1 g/dL (ref 12.0–15.0)
Lymphocytes Relative: 19 % (ref 12–46)
MCH: 33.6 pg (ref 26.0–34.0)
MCHC: 35.3 g/dL (ref 30.0–36.0)
MCV: 95.1 fL (ref 78.0–100.0)
Monocytes Absolute: 0.7 10*3/uL (ref 0.1–1.0)
Monocytes Relative: 8 % (ref 3–12)
Neutro Abs: 6.1 10*3/uL (ref 1.7–7.7)
RDW: 12.1 % (ref 11.5–15.5)

## 2013-05-07 LAB — LIPASE, BLOOD: Lipase: >3000 U/L — ABNORMAL HIGH (ref 11–59)

## 2013-05-07 LAB — COMPREHENSIVE METABOLIC PANEL
AST: 20 U/L (ref 0–37)
Albumin: 3.4 g/dL — ABNORMAL LOW (ref 3.5–5.2)
BUN: 7 mg/dL (ref 6–23)
Calcium: 8.4 mg/dL (ref 8.4–10.5)
Chloride: 102 mEq/L (ref 96–112)
Creatinine, Ser: 0.53 mg/dL (ref 0.50–1.10)
GFR calc non Af Amer: >90 mL/min (ref >90)
Sodium: 135 mEq/L (ref 135–145)
Total Bilirubin: 0.3 mg/dL (ref 0.3–1.2)
Total Protein: 6.1 g/dL (ref 6.0–8.3)

## 2013-05-07 SURGERY — ERCP, WITH INTERVENTION IF INDICATED
Anesthesia: Moderate Sedation

## 2013-05-07 MED ORDER — MIDAZOLAM HCL 10 MG/2ML IJ SOLN
INTRAMUSCULAR | Status: DC | PRN
  Administered 2013-05-07 (×3): 2.5 mg via INTRAVENOUS

## 2013-05-07 MED ORDER — ONDANSETRON HCL 4 MG/2ML IJ SOLN
4.0000 mg | Freq: Once | INTRAMUSCULAR | Status: AC
  Administered 2013-05-07: 4 mg via INTRAMUSCULAR
  Filled 2013-05-07: qty 2

## 2013-05-07 MED ORDER — BUTAMBEN-TETRACAINE-BENZOCAINE 2-2-14 % EX AERO
INHALATION_SPRAY | CUTANEOUS | Status: DC | PRN
  Administered 2013-05-07: 2 via TOPICAL

## 2013-05-07 MED ORDER — FENTANYL CITRATE 0.05 MG/ML IJ SOLN
INTRAMUSCULAR | Status: AC
  Filled 2013-05-07: qty 4

## 2013-05-07 MED ORDER — SODIUM CHLORIDE 0.9 % IV BOLUS (SEPSIS)
500.0000 mL | Freq: Once | INTRAVENOUS | Status: AC
  Administered 2013-05-07: 500 mL via INTRAVENOUS

## 2013-05-07 MED ORDER — MIDAZOLAM HCL 10 MG/2ML IJ SOLN
INTRAMUSCULAR | Status: AC
  Filled 2013-05-07: qty 4

## 2013-05-07 MED ORDER — CIPROFLOXACIN IN D5W 400 MG/200ML IV SOLN
400.0000 mg | Freq: Two times a day (BID) | INTRAVENOUS | Status: DC
  Administered 2013-05-07: 400 mg via INTRAVENOUS

## 2013-05-07 MED ORDER — ONDANSETRON HCL 4 MG/2ML IJ SOLN: 4.0000 mg | Freq: Once | INTRAMUSCULAR | Status: DC

## 2013-05-07 MED ORDER — DIPHENHYDRAMINE HCL 50 MG/ML IJ SOLN
INTRAMUSCULAR | Status: AC
  Filled 2013-05-07: qty 1

## 2013-05-07 MED ORDER — HYDROMORPHONE HCL PF 1 MG/ML IJ SOLN
0.5000 mg | Freq: Once | INTRAMUSCULAR | Status: AC
  Administered 2013-05-07: 0.5 mg via INTRAMUSCULAR
  Filled 2013-05-07: qty 1

## 2013-05-07 MED ORDER — HYDROMORPHONE HCL PF 1 MG/ML IJ SOLN
1.0000 mg | Freq: Once | INTRAMUSCULAR | Status: AC
  Administered 2013-05-07: 1 mg via INTRAVENOUS
  Filled 2013-05-07: qty 1

## 2013-05-07 MED ORDER — GLUCAGON HCL (RDNA) 1 MG IJ SOLR
INTRAMUSCULAR | Status: AC
  Filled 2013-05-07: qty 2

## 2013-05-07 MED ORDER — SODIUM CHLORIDE 0.9 % IV SOLN
INTRAVENOUS | Status: DC
  Administered 2013-05-07: 500 mL via INTRAVENOUS

## 2013-05-07 MED ORDER — SODIUM CHLORIDE 0.9 % IV SOLN
INTRAVENOUS | Status: DC | PRN
  Administered 2013-05-07: 08:00:00

## 2013-05-07 MED ORDER — FENTANYL CITRATE 0.05 MG/ML IJ SOLN
INTRAMUSCULAR | Status: DC | PRN
  Administered 2013-05-07 (×3): 25 ug via INTRAVENOUS

## 2013-05-07 MED ORDER — DIPHENHYDRAMINE HCL 50 MG/ML IJ SOLN
INTRAMUSCULAR | Status: DC | PRN
  Administered 2013-05-07: 25 mg via INTRAVENOUS

## 2013-05-07 MED ORDER — CIPROFLOXACIN IN D5W 400 MG/200ML IV SOLN
INTRAVENOUS | Status: AC
  Filled 2013-05-07: qty 200

## 2013-05-07 MED ORDER — HYDROMORPHONE HCL PF 1 MG/ML IJ SOLN: 0.5000 mg | Freq: Once | INTRAMUSCULAR | Status: DC

## 2013-05-07 NOTE — ED Notes (Signed)
Pt had endoscopy done earlier today and since she has been having abd pain and abd distention and nausea. Pt was told if she has any symptoms after study to come to ED for eval.

## 2013-05-07 NOTE — Progress Notes (Signed)
Pt is asleep on her left side. Arousable and appears comfortable at this time. Family remains at bedside. Pt stated she wanted to sleep for now and would prefer to have soda a little later. Waiting for MD to speak with family members.

## 2013-05-07 NOTE — H&P (Signed)
[facility] History and Physical  Sharon Welch BMW:[id number] DOB: 11-07-1970 DOA: 05/07/2013  Referring physician: er PCP: Thora Lance, MD  Specialists: GI- hung  Chief Complaint: abd pain  HPI: Sharon Welch is a 42 y.o. female   who had an ERCP today by Dr. Elnoria Howard, presenting the Emergency Department with a chief complaint of worsening epigastric pain since this morning after her procedure. She reports the pain started prior to discharge after her ERCP. She reports her pain was a 2/10 at discharge and now it is 5/10- better with doses of dilaudid. She denies radiation. +nausea.   She reports her only oral intake today was a small amount of grits and coke, she was unable to relate it to worsening pain. The patient reports her last BM last night 2000, + gas today.   The patient also states that she took a Norco at home without relief.   In the ER, her lipase was found to be > 3,000.  Er spoke with Dr. Elnoria Howard- asked medicine to admit for pancreatitis post ERCP   Review of Systems: all systems reviewed, negative unless stated above   Past Medical History  Diagnosis Date  . Scoliosis   . Chronic back pain   . Seizures     started 9/12-  . Headache(784.0)   . Chronic back pain   . Partial tear subscapularis tendon 12/27/2011  . Ovarian cyst    Past Surgical History  Procedure Laterality Date  . Back surgery  1989    scoliosis throsic-rods  . Tubal ligation    . Cesarean section    . Abdominal hysterectomy  2012  . Left arm    . Eus N/A 11/03/2012    Procedure: UPPER ENDOSCOPIC ULTRASOUND (EUS) LINEAR;  Surgeon: Theda Belfast, MD;  Location: [facility];  Service: Endoscopy;  Laterality: N/A;  . Cholecystectomy N/A 11/17/2012    Procedure: LAPAROSCOPIC CHOLECYSTECTOMY WITH INTRAOPERATIVE CHOLANGIOGRAM;  Surgeon: Axel Filler, MD;  Location: [facility] OR;  Service: General;  Laterality: N/A;  . Neck surgery     Social History:  reports that she has been smoking Cigarettes.  She  has a 15 pack-year smoking history. She has never used smokeless tobacco. She reports that she drinks alcohol. She reports that she does not use illicit drugs. -denies alcohol  Allergies  Allergen Reactions  . Tramadol Other (See Comments)    Possible seizures    Family History  Problem Relation Age of Onset  . Hypertension Mother   . Hypertension Father   . Cancer Father     prostate  . Heart disease Father   . Hypertension Brother   . Hypertension Brother      Prior to Admission medications   Medication Sig Start Date End Date Taking? Authorizing Provider  acetaminophen (TYLENOL) 500 MG tablet Take 1,000 mg by mouth every 6 (six) hours as needed for moderate pain.    Yes Historical Provider, MD  amitriptyline (ELAVIL) 10 MG tablet Take 30 mg by mouth at bedtime.    Yes Historical Provider, MD  gabapentin (NEURONTIN) 300 MG capsule Take 900-1,200 mg by mouth 2 (two) times daily. Take 3 capsule in the morning and 4 capsule at night   Yes Historical Provider, MD  HYDROcodone-acetaminophen (NORCO) 10-325 MG per tablet Take 1 tablet by mouth every 4 (four) hours as needed for severe pain.   Yes Historical Provider, MD  Multiple Vitamin (MULTIVITAMIN WITH MINERALS) TABS tablet Take 1 tablet by mouth daily.  Yes Historical Provider, MD  rOPINIRole (REQUIP) 0.25 MG tablet Take 0.25 mg by mouth at bedtime.   Yes Historical Provider, MD  sertraline (ZOLOFT) 100 MG tablet Take 100 mg by mouth every morning.    Yes Historical Provider, MD  tiZANidine (ZANAFLEX) 2 MG tablet Take 2 mg by mouth every 6 (six) hours as needed (for muscle spasms).   Yes Historical Provider, MD   Physical Exam: Filed Vitals:   05/07/13 2320  BP: 119/70  Pulse: 64  Temp: 98.3 F (36.8 C)  Resp: 16     General:  A+Ox3, NAD  Eyes: wnl  ENT: wnl  Neck: supple  Cardiovascular: rrr  Respiratory: clear  Abdomen: +BS, soft, +epigastric tenderness  Skin: no rashes or lesions  Musculoskeletal: moves  all 4 ext  Psychiatric: normal mood/affect  Neurologic: CN 2-12 intact  Labs on Admission:  Basic Metabolic Panel:  Recent Labs Lab 05/07/13 2037  NA 135  K 3.3*  CL 102  CO2 25  GLUCOSE 93  BUN 7  CREATININE 0.53  CALCIUM 8.4   Liver Function Tests:  Recent Labs Lab 05/07/13 2037  AST 20  ALT 14  ALKPHOS 101  BILITOT 0.3  PROT 6.1  ALBUMIN 3.4*    Recent Labs Lab 05/07/13 2037  LIPASE >3000*   No results found for this basename: AMMONIA,  in the last 168 hours CBC:  Recent Labs Lab 05/07/13 2037  WBC 8.5  NEUTROABS 6.1  HGB 13.1  HCT 37.1  MCV 95.1  PLT 237   Cardiac Enzymes: No results found for this basename: CKTOTAL, CKMB, CKMBINDEX, TROPONINI,  in the last 168 hours  BNP (last 3 results) No results found for this basename: PROBNP,  in the last 8760 hours CBG: No results found for this basename: GLUCAP,  in the last 168 hours  Radiological Exams on Admission: Dg Ercp Biliary & Pancreatic Ducts  05/07/2013   CLINICAL DATA:  Dilated common bile duct  EXAM: ERCP  TECHNIQUE: Multiple spot images obtained with the fluoroscopic device and submitted for interpretation post-procedure.  COMPARISON:  None.  FINDINGS: Five spot films were obtained during an ERCP. 3 min and 24 seconds of fluoroscopy was utilized. Injection of the common bile duct demonstrates dilatation. A balloon sweep of the duct was performed. No definitive filling defects are noted.   Electronically Signed   By: Alcide Clever M.D.   On: 05/07/2013 08:24   Dg Abd Acute W/chest  05/07/2013   CLINICAL DATA:  Post ERCP earlier today, now with epigastric abdominal pain  EXAM: ACUTE ABDOMEN SERIES (ABDOMEN 2 VIEW & CHEST 1 VIEW)  COMPARISON:  ERCP -earlier same day; chest radiograph - 04/02/2013; CT abdomen pelvis -10/30/2012  FINDINGS: Grossly unchanged cardiac silhouette and mediastinal contours. The lungs appear hyperexpanded with flattening of the bilateral hemidiaphragms and mild diffuse  slightly nodular thickening of the pulmonary interstitium. No focal airspace opacities. No pleural effusion or pneumothorax. No evidence of edema.  Nonobstructive bowel gas pattern. Enteric contrast is seen within the colon. No evidence of obstruction. No pneumoperitoneum, pneumatosis or portal venous gas.  Post cholecystectomy.  Post long segment paraspinal thoracolumbar fusion and lower cervical ACDF, incompletely evaluated. Persistent moderate scoliotic curvature of the thoracolumbar spine.  IMPRESSION: 1. No definite evidence of complication post recent ERCP. Specifically, no evidence of pneumoperitoneum or enteric obstruction. 2. Hyperexpanded lungs without acute cardiopulmonary disease. 3. Long segment paraspinal thoracolumbar fusion and lower cervical ACDF, incompletely evaluated.   Electronically Signed   By:  Simonne Come M.D.   On: 05/07/2013 20:25      Assessment/Plan Active Problems:   Hypokalemia   Pancreatitis, acute   Acute pancreatitis after ERCP- NPO, pain control, IVF, GI to see in AM  Hypokalemia- replete in IVF  Tobacco abuse- encourage cessation, patch placed  GI, Hung to see in AM  Code Status: full Family Communication: patient/husband Disposition Plan:admit  Time spent: 75 min  VANN, JESSICA [facility] Pager [phone]  If 7PM-7AM, please contact night-coverage [email or web address] Password [facility] 05/07/2013, 11:53 PM

## 2013-05-07 NOTE — ED Notes (Signed)
Bed: ZO10 Expected date:  Expected time:  Means of arrival:  Comments: Hold for [employer]

## 2013-05-07 NOTE — ED Provider Notes (Signed)
Medical screening examination/treatment/procedure(s) were performed by non-physician practitioner and as supervising physician I was immediately available for consultation/collaboration.  Pt's labs are consistent with pancreatitis following her ERCP procedure.  Will consult with gastroenterology.  Discussed case with Dr Elnoria Howard.  Will admit pt to medical service.  He will follow along.  Keep NPO  Celene Kras, MD 05/07/13 [phone]

## 2013-05-07 NOTE — Op Note (Signed)
[facility] [address] [place], [address]   ERCP PROCEDURE REPORT  PATIENT: Margurette, Brener.  MR# :[id number] BIRTHDATE: 25-Dec-1970  GENDER: Female ENDOSCOPIST: Jeani Hawking, MD REFERRED BY: PROCEDURE DATE:  05/07/2013 PROCEDURE:   ERCP with removal of calculus/calculi ASA CLASS:   Class II INDICATIONS:abnormal abdominal CT. MEDICATIONS: Versed 7 mg IV, Fentanyl 75 mcg IV, and Benadryl 25 mg IV TOPICAL ANESTHETIC: Cetacaine Spray  DESCRIPTION OF PROCEDURE:   After the risks benefits and alternatives of the procedure were thoroughly explained, informed consent was obtained.  The     endoscope was introduced through the mouth  and advanced to the second portion of the duodenum.  The ampulla was located and cannulated with the sphincterotome. Initial cannulation resulted in a PD cannulation.  Two successive attempts resulted in PD cannulation and with the third attempt the guidewire was left in the PD.  A secondary wire was then used and after several attempts in a full bow position the guidewire was advanced to the right intrahepatic ducts.  Contrast injection revealed a dilated CBD at 15 mm and centrally dilated intrahepatic ducts.  No clear evidence of stones, however, with her obstructive liver panel pattern, dilated CBD, and clinical history of epigastric pain a 10 mm - 12 mm sphincterotomy was created.  The duct was swept three times without any obvious findings of stones. The final occlusion cholangiogram was negative for stones.     The scope was then completely withdrawn from the patient and the procedure terminated.     COMPLICATIONS:  ENDOSCOPIC IMPRESSION: 1) Dilated CBD and central intrahepatic ducts.  RECOMMENDATIONS: 1) Follow up in the office inone month with a repeat liver panel.    _______________________________ eSigned:  Jeani Hawking, MD 05/07/2013 8:08 AM   CC:

## 2013-05-07 NOTE — ED Notes (Signed)
Patient transported to X-ray 

## 2013-05-07 NOTE — ED Provider Notes (Signed)
CSN: [id number]     Arrival date & time 05/07/13  1859 History   First MD Initiated Contact with Patient 05/07/13 1939     Chief Complaint  Patient presents with  . Abdominal Pain   (Consider location/radiation/quality/duration/timing/severity/associated sxs/prior Treatment) HPI Comments: The patient is a 42 year-old female with a past medical history of a ERCP today by Dr. Elnoria Howard, presenting the Emergency Department with a chief complaint of worsening constant epigastric pain since this morning.  She reports the pain started prior to discharge after her ERCP.  She reports her pain was a 2/10 at discharge and now it is 5/10.  She denies radiation.  She reports associated nausea without emesis.  She reports her only oral intake today was a small amount of grits and coke, she was unable to relate it to worsening pain. The patient reports her last BM last night 2000 without blood pus or dark stools, reports flatulence today. The patient also states that she took a Norco at home without relief.  She also reports a laparoscopic cholecystomy 11/17/2012.      The history is provided by the patient.    Past Medical History  Diagnosis Date  . Scoliosis   . Chronic back pain   . Seizures     started 9/12-  . Headache(784.0)   . Chronic back pain   . Partial tear subscapularis tendon 12/27/2011  . Ovarian cyst    Past Surgical History  Procedure Laterality Date  . Back surgery  1989    scoliosis throsic-rods  . Tubal ligation    . Cesarean section    . Abdominal hysterectomy  2012  . Left arm    . Eus N/A 11/03/2012    Procedure: UPPER ENDOSCOPIC ULTRASOUND (EUS) LINEAR;  Surgeon: Theda Belfast, MD;  Location: [facility];  Service: Endoscopy;  Laterality: N/A;  . Cholecystectomy N/A 11/17/2012    Procedure: LAPAROSCOPIC CHOLECYSTECTOMY WITH INTRAOPERATIVE CHOLANGIOGRAM;  Surgeon: Axel Filler, MD;  Location: [facility] OR;  Service: General;  Laterality: N/A;  . Neck surgery     Family History   Problem Relation Age of Onset  . Hypertension Mother   . Hypertension Father   . Cancer Father     prostate  . Heart disease Father   . Hypertension Brother   . Hypertension Brother    History  Substance Use Topics  . Smoking status: Current Every Day Smoker -- 0.75 packs/day for 20 years    Types: Cigarettes  . Smokeless tobacco: Never Used  . Alcohol Use: Yes     Comment: 1-2 sips a years   OB History   Grav Para Term Preterm Abortions TAB SAB Ect Mult Living                 Review of Systems  All other systems reviewed and are negative.    Allergies  Tramadol  Home Medications   Current Outpatient Rx  Name  Route  Sig  Dispense  Refill  . acetaminophen (TYLENOL) 500 MG tablet   Oral   Take 1,000 mg by mouth every 6 (six) hours as needed for moderate pain.          Marland Kitchen amitriptyline (ELAVIL) 10 MG tablet   Oral   Take 30 mg by mouth at bedtime.          . gabapentin (NEURONTIN) 300 MG capsule   Oral   Take 900-1,200 mg by mouth 2 (two) times daily. Take 3 capsule in the  morning and 4 capsule at night         . HYDROcodone-acetaminophen (NORCO) 10-325 MG per tablet   Oral   Take 1 tablet by mouth every 4 (four) hours as needed for severe pain.         . Multiple Vitamin (MULTIVITAMIN WITH MINERALS) TABS tablet   Oral   Take 1 tablet by mouth daily.         Marland Kitchen rOPINIRole (REQUIP) 0.25 MG tablet   Oral   Take 0.25 mg by mouth at bedtime.         . sertraline (ZOLOFT) 100 MG tablet   Oral   Take 100 mg by mouth every morning.          Marland Kitchen tiZANidine (ZANAFLEX) 2 MG tablet   Oral   Take 2 mg by mouth every 6 (six) hours as needed (for muscle spasms).          BP 119/70  Pulse 64  Temp(Src) 98.3 F (36.8 C) (Oral)  Resp 16  SpO2 95% Physical Exam  Nursing note and vitals reviewed. Constitutional: She appears well-developed and well-nourished. No distress.  HENT:  Head: Normocephalic and atraumatic.  Eyes: EOM are normal. No  scleral icterus.  Neck: Neck supple.  Cardiovascular: Normal rate and regular rhythm.   No murmur heard. Pulmonary/Chest: Effort normal and breath sounds normal. No respiratory distress. She has no wheezes. She has no rales.  Abdominal: Soft. Bowel sounds are increased. There is tenderness in the epigastric area. There is no rigidity, no rebound, no guarding, no tenderness at McBurney's point and negative Murphy's sign.  Neurological: She is alert.  Skin: Skin is warm and dry.  Psychiatric: She has a normal mood and affect.    ED Course  Procedures (including critical care time) Labs Review Labs Reviewed  LIPASE, BLOOD - Abnormal; Notable for the following:    Lipase >3000 (*)    All other components within normal limits  COMPREHENSIVE METABOLIC PANEL - Abnormal; Notable for the following:    Potassium 3.3 (*)    Albumin 3.4 (*)    All other components within normal limits  CBC WITH DIFFERENTIAL   Imaging Review Dg Ercp Biliary & Pancreatic Ducts  05/07/2013   CLINICAL DATA:  Dilated common bile duct  EXAM: ERCP  TECHNIQUE: Multiple spot images obtained with the fluoroscopic device and submitted for interpretation post-procedure.  COMPARISON:  None.  FINDINGS: Five spot films were obtained during an ERCP. 3 min and 24 seconds of fluoroscopy was utilized. Injection of the common bile duct demonstrates dilatation. A balloon sweep of the duct was performed. No definitive filling defects are noted.   Electronically Signed   By: Alcide Clever M.D.   On: 05/07/2013 08:24   Dg Abd Acute W/chest  05/07/2013   CLINICAL DATA:  Post ERCP earlier today, now with epigastric abdominal pain  EXAM: ACUTE ABDOMEN SERIES (ABDOMEN 2 VIEW & CHEST 1 VIEW)  COMPARISON:  ERCP -earlier same day; chest radiograph - 04/02/2013; CT abdomen pelvis -10/30/2012  FINDINGS: Grossly unchanged cardiac silhouette and mediastinal contours. The lungs appear hyperexpanded with flattening of the bilateral hemidiaphragms and  mild diffuse slightly nodular thickening of the pulmonary interstitium. No focal airspace opacities. No pleural effusion or pneumothorax. No evidence of edema.  Nonobstructive bowel gas pattern. Enteric contrast is seen within the colon. No evidence of obstruction. No pneumoperitoneum, pneumatosis or portal venous gas.  Post cholecystectomy.  Post long segment paraspinal thoracolumbar fusion and lower cervical  ACDF, incompletely evaluated. Persistent moderate scoliotic curvature of the thoracolumbar spine.  IMPRESSION: 1. No definite evidence of complication post recent ERCP. Specifically, no evidence of pneumoperitoneum or enteric obstruction. 2. Hyperexpanded lungs without acute cardiopulmonary disease. 3. Long segment paraspinal thoracolumbar fusion and lower cervical ACDF, incompletely evaluated.   Electronically Signed   By: Simonne Come M.D.   On: 05/07/2013 20:25    EKG Interpretation   None       MDM   1. Pancreatitis   2. Hypokalemia   3. Pancreatitis, acute   4. Abdominal pain    Patient with a ERCP today presents with Epigastric pain.  Acute abdomin ordered to assess possible perforation or SBO, lipase to assess for pancreatitis.   2155: Re-eval patient reports nausea has resolved and pain 3/10.  Discussed negative XR and labs thus far.  She is requesting something to drink but will PO challenge after all labs have resulted if all normal.  Dr. Lynelle Doctor discussed patient condition and and lab results with hospitalitis and Dr. Elnoria Howard for admission.  2254: Re-eval patient reports she has some pain.  And mild reports nausea.  Discussed lab results and treatment plan with patient she reports understanding and will be evaluated by Dr. Elnoria Howard tomorrow in the hospital.   Meds given in ED:  Medications  ondansetron [facility]) injection 4 mg (not administered)  HYDROmorphone (DILAUDID) injection 0.5 mg (0.5 mg Intramuscular Given 05/07/13 2048)  ondansetron (ZOFRAN) injection 4 mg (4 mg  Intramuscular Given 05/07/13 2047)  sodium chloride 0.9 % bolus 500 mL (500 mLs Intravenous New Bag/Given 05/07/13 2325)  HYDROmorphone (DILAUDID) injection 1 mg (1 mg Intravenous Given 05/07/13 2325)    New Prescriptions   No medications on file      Clabe Seal, PA-C 05/08/13 0056

## 2013-05-08 ENCOUNTER — Encounter (HOSPITAL_COMMUNITY): Payer: Self-pay | Admitting: *Deleted

## 2013-05-08 LAB — CBC
HCT: 36.5 % (ref 36.0–46.0)
Hemoglobin: 12.7 g/dL (ref 12.0–15.0)
MCH: 33.4 pg (ref 26.0–34.0)
MCHC: 34.8 g/dL (ref 30.0–36.0)
MCV: 96.1 fL (ref 78.0–100.0)
Platelets: 241 10*3/uL (ref 150–400)
RBC: 3.8 MIL/uL — ABNORMAL LOW (ref 3.87–5.11)

## 2013-05-08 LAB — COMPREHENSIVE METABOLIC PANEL
AST: 26 U/L (ref 0–37)
Albumin: 3.1 g/dL — ABNORMAL LOW (ref 3.5–5.2)
Alkaline Phosphatase: 105 U/L (ref 39–117)
Chloride: 104 mEq/L (ref 96–112)
Creatinine, Ser: 0.56 mg/dL (ref 0.50–1.10)
Potassium: 3.2 mEq/L — ABNORMAL LOW (ref 3.5–5.1)
Total Bilirubin: 0.3 mg/dL (ref 0.3–1.2)
Total Protein: 5.7 g/dL — ABNORMAL LOW (ref 6.0–8.3)

## 2013-05-08 MED ORDER — BIOTENE DRY MOUTH MT LIQD
15.0000 mL | Freq: Two times a day (BID) | OROMUCOSAL | Status: DC
  Administered 2013-05-08 – 2013-05-09 (×3): 15 mL via OROMUCOSAL
  Filled 2013-05-08: qty 15

## 2013-05-08 MED ORDER — SODIUM CHLORIDE 0.9 % IJ SOLN: 9.0000 mL | INTRAMUSCULAR | Status: DC | PRN

## 2013-05-08 MED ORDER — DIPHENHYDRAMINE HCL 12.5 MG/5ML PO ELIX: 12.5000 mg | ORAL_SOLUTION | Freq: Four times a day (QID) | ORAL | Status: DC | PRN

## 2013-05-08 MED ORDER — ONDANSETRON HCL 4 MG PO TABS: 4.0000 mg | ORAL_TABLET | Freq: Four times a day (QID) | ORAL | Status: DC | PRN

## 2013-05-08 MED ORDER — ZOLPIDEM TARTRATE 5 MG PO TABS
5.0000 mg | ORAL_TABLET | Freq: Every evening | ORAL | Status: DC | PRN
  Administered 2013-05-08 – 2013-05-10 (×2): 5 mg via ORAL
  Filled 2013-05-08 (×2): qty 1

## 2013-05-08 MED ORDER — NICOTINE 14 MG/24HR TD PT24
14.0000 mg | MEDICATED_PATCH | Freq: Every day | TRANSDERMAL | Status: DC
  Administered 2013-05-08 – 2013-05-10 (×3): 14 mg via TRANSDERMAL
  Filled 2013-05-08 (×3): qty 1

## 2013-05-08 MED ORDER — ONDANSETRON HCL 4 MG/2ML IJ SOLN
4.0000 mg | Freq: Four times a day (QID) | INTRAMUSCULAR | Status: DC | PRN
  Administered 2013-05-08 – 2013-05-10 (×3): 4 mg via INTRAVENOUS
  Filled 2013-05-08 (×3): qty 2

## 2013-05-08 MED ORDER — POTASSIUM CHLORIDE CRYS ER 20 MEQ PO TBCR
40.0000 meq | EXTENDED_RELEASE_TABLET | Freq: Once | ORAL | Status: AC
  Administered 2013-05-08: 08:00:00 40 meq via ORAL
  Filled 2013-05-08: qty 2

## 2013-05-08 MED ORDER — SERTRALINE HCL 100 MG PO TABS
100.0000 mg | ORAL_TABLET | Freq: Every morning | ORAL | Status: DC
  Administered 2013-05-08 – 2013-05-10 (×3): 100 mg via ORAL
  Filled 2013-05-08 (×3): qty 1

## 2013-05-08 MED ORDER — ONDANSETRON HCL 4 MG/2ML IJ SOLN
4.0000 mg | Freq: Once | INTRAMUSCULAR | Status: AC
  Administered 2013-05-08: 4 mg via INTRAVENOUS
  Filled 2013-05-08: qty 2

## 2013-05-08 MED ORDER — POTASSIUM CHLORIDE IN NACL 40-0.9 MEQ/L-% IV SOLN
INTRAVENOUS | Status: DC
  Administered 2013-05-08 – 2013-05-10 (×5): via INTRAVENOUS
  Filled 2013-05-08 (×6): qty 1000

## 2013-05-08 MED ORDER — HYDROMORPHONE HCL PF 1 MG/ML IJ SOLN
1.0000 mg | INTRAMUSCULAR | Status: DC | PRN
  Administered 2013-05-08 (×2): 2 mg via INTRAVENOUS
  Filled 2013-05-08 (×2): qty 2

## 2013-05-08 MED ORDER — OXYCODONE-ACETAMINOPHEN 5-325 MG PO TABS
1.0000 | ORAL_TABLET | ORAL | Status: DC | PRN
  Administered 2013-05-10: 2 via ORAL
  Filled 2013-05-08: qty 2

## 2013-05-08 MED ORDER — ONDANSETRON HCL 4 MG/2ML IJ SOLN: 4.0000 mg | Freq: Four times a day (QID) | INTRAMUSCULAR | Status: DC | PRN

## 2013-05-08 MED ORDER — HYDROMORPHONE HCL PF 2 MG/ML IJ SOLN
2.0000 mg | INTRAMUSCULAR | Status: DC | PRN
  Administered 2013-05-08 (×2): 2 mg via INTRAVENOUS
  Filled 2013-05-08 (×2): qty 1

## 2013-05-08 MED ORDER — GABAPENTIN 300 MG PO CAPS
900.0000 mg | ORAL_CAPSULE | Freq: Every day | ORAL | Status: DC
  Administered 2013-05-08 – 2013-05-10 (×3): 900 mg via ORAL
  Filled 2013-05-08 (×3): qty 3

## 2013-05-08 MED ORDER — AMITRIPTYLINE HCL 10 MG PO TABS
30.0000 mg | ORAL_TABLET | Freq: Every day | ORAL | Status: DC
  Administered 2013-05-08 – 2013-05-10 (×2): 30 mg via ORAL
  Filled 2013-05-08 (×4): qty 3

## 2013-05-08 MED ORDER — GABAPENTIN 400 MG PO CAPS
1200.0000 mg | ORAL_CAPSULE | Freq: Every day | ORAL | Status: DC
  Administered 2013-05-08 – 2013-05-10 (×3): 1200 mg via ORAL
  Filled 2013-05-08 (×5): qty 3

## 2013-05-08 MED ORDER — NALOXONE HCL 0.4 MG/ML IJ SOLN: 0.4000 mg | INTRAMUSCULAR | Status: DC | PRN

## 2013-05-08 MED ORDER — HYDROMORPHONE 0.3 MG/ML IV SOLN
INTRAVENOUS | Status: DC
  Administered 2013-05-08: 17:00:00 via INTRAVENOUS
  Administered 2013-05-09: 0.3 mg via INTRAVENOUS
  Administered 2013-05-09: 3.9 mg via INTRAVENOUS
  Administered 2013-05-09: 2.1 mL via INTRAVENOUS
  Administered 2013-05-10: 0.9 mg via INTRAVENOUS
  Filled 2013-05-08 (×4): qty 25

## 2013-05-08 MED ORDER — TIZANIDINE HCL 2 MG PO TABS
2.0000 mg | ORAL_TABLET | Freq: Four times a day (QID) | ORAL | Status: DC | PRN
  Filled 2013-05-08 (×2): qty 1

## 2013-05-08 MED ORDER — ROPINIROLE HCL 0.25 MG PO TABS
0.2500 mg | ORAL_TABLET | Freq: Every day | ORAL | Status: DC
  Administered 2013-05-10: 01:00:00 0.25 mg via ORAL
  Filled 2013-05-08 (×4): qty 1

## 2013-05-08 MED ORDER — ENOXAPARIN SODIUM 40 MG/0.4ML ~~LOC~~ SOLN
40.0000 mg | SUBCUTANEOUS | Status: DC
  Administered 2013-05-08 – 2013-05-10 (×3): 40 mg via SUBCUTANEOUS
  Filled 2013-05-08 (×3): qty 0.4

## 2013-05-08 MED ORDER — DIPHENHYDRAMINE HCL 50 MG/ML IJ SOLN: 12.5000 mg | Freq: Four times a day (QID) | INTRAMUSCULAR | Status: DC | PRN

## 2013-05-08 NOTE — Consult Note (Signed)
Reason for Consult: Post-ERCP pancreatitis Referring Physician: [facility].  Sharon Welch HPI: The patient is admitted for a post-ERCP pancreatitis.  Her pain started shortly after her ERCP yesterday morning, but he did not let the staff know.  Her pain increasingly worsened and then she was admitted to the hospital.  Her imaging was normal, but her lipase was >3000.  She reports feeling better at this time, but her pain is still severe.  The ERCP was difficult to perform as PD cannulation occurred.  Her CBD was dilated and negative for any evidence of stones.  Past Medical History  Diagnosis Date  . Scoliosis   . Chronic back pain   . Seizures     started 9/12-  . Headache(784.0)   . Chronic back pain   . Partial tear subscapularis tendon 12/27/2011  . Ovarian cyst     Past Surgical History  Procedure Laterality Date  . Back surgery  1989    scoliosis throsic-rods  . Tubal ligation    . Cesarean section    . Abdominal hysterectomy  2012  . Left arm    . Eus N/A 11/03/2012    Procedure: UPPER ENDOSCOPIC ULTRASOUND (EUS) LINEAR;  Surgeon: Theda Belfast, MD;  Location: [facility];  Service: Endoscopy;  Laterality: N/A;  . Cholecystectomy N/A 11/17/2012    Procedure: LAPAROSCOPIC CHOLECYSTECTOMY WITH INTRAOPERATIVE CHOLANGIOGRAM;  Surgeon: Axel Filler, MD;  Location: [facility] OR;  Service: General;  Laterality: N/A;  . Neck surgery      Family History  Problem Relation Age of Onset  . Hypertension Mother   . Hypertension Father   . Cancer Father     prostate  . Heart disease Father   . Hypertension Brother   . Hypertension Brother     Social History:  reports that she has been smoking Cigarettes.  She has a 15 pack-year smoking history. She has never used smokeless tobacco. She reports that she drinks alcohol. She reports that she does not use illicit drugs.  Allergies:  Allergies  Allergen Reactions  . Tramadol Other (See Comments)    Possible seizures     Medications:  Scheduled: . amitriptyline  30 mg Oral QHS  . antiseptic oral rinse  15 mL Mouth Rinse BID  . enoxaparin (LOVENOX) injection  40 mg Subcutaneous Q24H  . gabapentin  1,200 mg Oral QHS  . gabapentin  900 mg Oral Daily  . HYDROmorphone PCA 0.3 mg/mL   Intravenous Q4H  . nicotine  14 mg Transdermal Daily  . rOPINIRole  0.25 mg Oral QHS  . sertraline  100 mg Oral q morning - 10a   Continuous: . 0.9 % NaCl with KCl 40 mEq / L 75 mL/hr at 05/08/13 0137    Results for orders placed during the hospital encounter of 05/07/13 (from the past 24 hour(s))  LIPASE, BLOOD     Status: Abnormal   Collection Time    05/07/13  8:37 PM      Result Value Range   Lipase >3000 (*) 11 - 59 U/L  CBC WITH DIFFERENTIAL     Status: None   Collection Time    05/07/13  8:37 PM      Result Value Range   WBC 8.5  4.0 - 10.5 K/uL   RBC 3.90  3.87 - 5.11 MIL/uL   Hemoglobin 13.1  12.0 - 15.0 g/dL   HCT 13.2  44.0 - 10.2 %   MCV 95.1  78.0 - 100.0 fL  MCH 33.6  26.0 - 34.0 pg   MCHC 35.3  30.0 - 36.0 g/dL   RDW 16.1  09.6 - 04.5 %   Platelets 237  150 - 400 K/uL   Neutrophils Relative % 72  43 - 77 %   Neutro Abs 6.1  1.7 - 7.7 K/uL   Lymphocytes Relative 19  12 - 46 %   Lymphs Abs 1.7  0.7 - 4.0 K/uL   Monocytes Relative 8  3 - 12 %   Monocytes Absolute 0.7  0.1 - 1.0 K/uL   Eosinophils Relative 1  0 - 5 %   Eosinophils Absolute 0.1  0.0 - 0.7 K/uL   Basophils Relative 0  0 - 1 %   Basophils Absolute 0.0  0.0 - 0.1 K/uL  COMPREHENSIVE METABOLIC PANEL     Status: Abnormal   Collection Time    05/07/13  8:37 PM      Result Value Range   Sodium 135  135 - 145 mEq/L   Potassium 3.3 (*) 3.5 - 5.1 mEq/L   Chloride 102  96 - 112 mEq/L   CO2 25  19 - 32 mEq/L   Glucose, Bld 93  70 - 99 mg/dL   BUN 7  6 - 23 mg/dL   Creatinine, Ser 4.09  0.50 - 1.10 mg/dL   Calcium 8.4  8.4 - 81.1 mg/dL   Total Protein 6.1  6.0 - 8.3 g/dL   Albumin 3.4 (*) 3.5 - 5.2 g/dL   AST 20  0 - 37 U/L    ALT 14  0 - 35 U/L   Alkaline Phosphatase 101  39 - 117 U/L   Total Bilirubin 0.3  0.3 - 1.2 mg/dL   GFR calc non Af Amer >90  >90 mL/min   GFR calc Af Amer >90  >90 mL/min  COMPREHENSIVE METABOLIC PANEL     Status: Abnormal   Collection Time    05/08/13  4:25 AM      Result Value Range   Sodium 137  135 - 145 mEq/L   Potassium 3.2 (*) 3.5 - 5.1 mEq/L   Chloride 104  96 - 112 mEq/L   CO2 26  19 - 32 mEq/L   Glucose, Bld 92  70 - 99 mg/dL   BUN 8  6 - 23 mg/dL   Creatinine, Ser 9.14  0.50 - 1.10 mg/dL   Calcium 8.0 (*) 8.4 - 10.5 mg/dL   Total Protein 5.7 (*) 6.0 - 8.3 g/dL   Albumin 3.1 (*) 3.5 - 5.2 g/dL   AST 26  0 - 37 U/L   ALT 17  0 - 35 U/L   Alkaline Phosphatase 105  39 - 117 U/L   Total Bilirubin 0.3  0.3 - 1.2 mg/dL   GFR calc non Af Amer >90  >90 mL/min   GFR calc Af Amer >90  >90 mL/min  CBC     Status: Abnormal   Collection Time    05/08/13  4:25 AM      Result Value Range   WBC 8.7  4.0 - 10.5 K/uL   RBC 3.80 (*) 3.87 - 5.11 MIL/uL   Hemoglobin 12.7  12.0 - 15.0 g/dL   HCT 78.2  95.6 - 21.3 %   MCV 96.1  78.0 - 100.0 fL   MCH 33.4  26.0 - 34.0 pg   MCHC 34.8  30.0 - 36.0 g/dL   RDW 08.6  57.8 - 46.9 %   Platelets  241  150 - 400 K/uL  LIPASE, BLOOD     Status: Abnormal   Collection Time    05/08/13  4:25 AM      Result Value Range   Lipase >3000 (*) 11 - 59 U/L     Dg Ercp Biliary & Pancreatic Ducts  05/07/2013   CLINICAL DATA:  Dilated common bile duct  EXAM: ERCP  TECHNIQUE: Multiple spot images obtained with the fluoroscopic device and submitted for interpretation post-procedure.  COMPARISON:  None.  FINDINGS: Five spot films were obtained during an ERCP. 3 min and 24 seconds of fluoroscopy was utilized. Injection of the common bile duct demonstrates dilatation. A balloon sweep of the duct was performed. No definitive filling defects are noted.   Electronically Signed   By: Alcide Clever M.D.   On: 05/07/2013 08:24   Dg Abd Acute W/chest  05/07/2013    CLINICAL DATA:  Post ERCP earlier today, now with epigastric abdominal pain  EXAM: ACUTE ABDOMEN SERIES (ABDOMEN 2 VIEW & CHEST 1 VIEW)  COMPARISON:  ERCP -earlier same day; chest radiograph - 04/02/2013; CT abdomen pelvis -10/30/2012  FINDINGS: Grossly unchanged cardiac silhouette and mediastinal contours. The lungs appear hyperexpanded with flattening of the bilateral hemidiaphragms and mild diffuse slightly nodular thickening of the pulmonary interstitium. No focal airspace opacities. No pleural effusion or pneumothorax. No evidence of edema.  Nonobstructive bowel gas pattern. Enteric contrast is seen within the colon. No evidence of obstruction. No pneumoperitoneum, pneumatosis or portal venous gas.  Post cholecystectomy.  Post long segment paraspinal thoracolumbar fusion and lower cervical ACDF, incompletely evaluated. Persistent moderate scoliotic curvature of the thoracolumbar spine.  IMPRESSION: 1. No definite evidence of complication post recent ERCP. Specifically, no evidence of pneumoperitoneum or enteric obstruction. 2. Hyperexpanded lungs without acute cardiopulmonary disease. 3. Long segment paraspinal thoracolumbar fusion and lower cervical ACDF, incompletely evaluated.   Electronically Signed   By: Simonne Come M.D.   On: 05/07/2013 20:25    ROS:  As stated above in the HPI otherwise negative.  Blood pressure 119/76, pulse 72, temperature 98.1 F (36.7 C), temperature source Oral, resp. rate 16, height 5\' 1"  (1.549 m), weight 102 lb (46.267 kg), SpO2 98.00%.    PE: Gen: NAD, Alert and Oriented HEENT:  [place]/AT, EOMI Neck: Supple, no LAD Lungs: CTA Bilaterally CV: RRR without M/G/R ABM: Soft, tender in the epigastrium, +BS Ext: No C/C/E  Assessment/Plan: 1) Post-ERCP pancratitis   She does reports some easing of her pain.  I am hoping that this will pass soon, but her pain is rather severe at this time.  I will start her on a dilaudid PCA.  Plan: 1) PCA. 2) NPO.  Kadan Millstein  D 05/08/2013, 12:18 PM

## 2013-05-08 NOTE — Progress Notes (Signed)
Patient ID: Sharon Welch, female   DOB: January 27, 1971, 42 y.o.   MRN: [id number]  [facility] PROGRESS NOTE  Sharon Welch WUJ:[id number] DOB: 1971/06/11 DOA: 05/07/2013 PCP: Thora Lance, MD  Brief narrative: 42 y.o. female who had an ERCP 11/14 by Dr. Elnoria Howard, presenting the Emergency Department with a chief complaint of worsening epigastric pain since the morning of the admission after her procedure. She reported the pain started prior to discharge after her ERCP, 2/10 at discharge and on admission up to 5/10, non radiating, worse with oral intake and no specific alleviating factors. In the ER, her lipase was found to be > 3,000. ED spoke with Dr. Elnoria Howard- asked medicine to admit for pancreatitis post ERCP   Active Problems:   Pancreatitis, acute - lipase still elevated - continue supportive care with IVF, analgesia and antiemetics as needed - repeat lipase in AM   Hypokalemia  - mild, will continue to supplement as indicated  - BMP in AM  Consultants:  GI  Procedures/Studies: Dg Ercp Biliary & Pancreatic Ducts   05/07/2013   Five spot films were obtained during an ERCP. 3 min and 24 seconds of fluoroscopy was utilized. Injection of the common bile duct demonstrates dilatation. A balloon sweep of the duct was performed. No definitive filling defects are noted.     Dg Abd Acute W/chest   05/07/2013    1. No definite evidence of complication post recent ERCP. Specifically, no evidence of pneumoperitoneum or enteric obstruction.  2. Hyperexpanded lungs without acute cardiopulmonary disease.  3. Long segment paraspinal thoracolumbar fusion and lower cervical ACDF, incompletely evaluated.     Antibiotics:  None  Code Status: Full Family Communication: Pt at bedside Disposition Plan: Home when medically stable  HPI/Subjective: No events overnight.   Objective: Filed Vitals:   05/07/13 2320 05/08/13 0052 05/08/13 0100 05/08/13 0531  BP: 119/70 125/76  119/76  Pulse: 64 86  72   Temp: 98.3 F (36.8 C) 98 F (36.7 C)  98.1 F (36.7 C)  TempSrc: Oral Oral  Oral  Resp: 16 15  16   Height:   5\' 1"  (1.549 m)   Weight:   46.267 kg (102 lb)   SpO2: 95% 97%  98%    Intake/Output Summary (Last 24 hours) at 05/08/13 0646 Last data filed at 05/08/13 0500  Gross per 24 hour  Intake      0 ml  Output      0 ml  Net      0 ml    Exam:   General:  Pt is alert, follows commands appropriately, not in acute distress  Cardiovascular: Regular rate and rhythm, S1/S2, no murmurs, no rubs, no gallops  Respiratory: Clear to auscultation bilaterally, no wheezing, no crackles, no rhonchi  Abdomen: Soft, tender in epigastric area, non distended, bowel sounds present, no guarding  Extremities: No edema, pulses DP and PT palpable bilaterally  Neuro: Grossly nonfocal  Data Reviewed: Basic Metabolic Panel:  Recent Labs Lab 05/07/13 2037 05/08/13 0425  NA 135 137  K 3.3* 3.2*  CL 102 104  CO2 25 26  GLUCOSE 93 92  BUN 7 8  CREATININE 0.53 0.56  CALCIUM 8.4 8.0*   Liver Function Tests:  Recent Labs Lab 05/07/13 2037 05/08/13 0425  AST 20 26  ALT 14 17  ALKPHOS 101 105  BILITOT 0.3 0.3  PROT 6.1 5.7*  ALBUMIN 3.4* 3.1*    Recent Labs Lab 05/07/13 2037 05/08/13 0425  LIPASE >3000* >3000*  CBC:  Recent Labs Lab 05/07/13 2037 05/08/13 0425  WBC 8.5 8.7  NEUTROABS 6.1  --   HGB 13.1 12.7  HCT 37.1 36.5  MCV 95.1 96.1  PLT 237 241   Scheduled Meds: . enoxaparin (LOVENOX) injection  40 mg Subcutaneous Q24H  . gabapentin  1,200 mg Oral QHS  . gabapentin  900 mg Oral Daily  . nicotine  14 mg Transdermal Daily   Continuous Infusions: . 0.9 % NaCl with KCl 40 mEq / L 75 mL/hr at 05/08/13 1610   Debbora Presto, MD  [facility] Pager [phone]  If 7PM-7AM, please contact night-coverage [email or web address] Password [email or web address] 05/08/2013, 6:46 AM   LOS: 1 day

## 2013-05-09 LAB — CBC
HCT: 33.8 % — ABNORMAL LOW (ref 36.0–46.0)
Hemoglobin: 11.7 g/dL — ABNORMAL LOW (ref 12.0–15.0)
MCH: 33.3 pg (ref 26.0–34.0)
MCV: 96.3 fL (ref 78.0–100.0)
RBC: 3.51 MIL/uL — ABNORMAL LOW (ref 3.87–5.11)
RDW: 11.7 % (ref 11.5–15.5)

## 2013-05-09 LAB — BASIC METABOLIC PANEL
BUN: 7 mg/dL (ref 6–23)
CO2: 21 mEq/L (ref 19–32)
Calcium: 8.1 mg/dL — ABNORMAL LOW (ref 8.4–10.5)
Creatinine, Ser: 0.49 mg/dL — ABNORMAL LOW (ref 0.50–1.10)
GFR calc non Af Amer: >90 mL/min (ref >90)
Glucose, Bld: 45 mg/dL — ABNORMAL LOW (ref 70–99)
Potassium: 4.4 mEq/L (ref 3.5–5.1)

## 2013-05-09 NOTE — Progress Notes (Signed)
Pt used 4.8 mg PCA from 1230 to 1630.

## 2013-05-09 NOTE — Progress Notes (Signed)
Subjective: She is better.  The PCA is very beneficial for her.  Objective: Vital signs in last 24 hours: Temp:  [98.3 F (36.8 C)-99 F (37.2 C)] 99 F (37.2 C) (11/16 0500) Pulse Rate:  [68-97] 97 (11/16 0500) Resp:  [12-18] 14 (11/16 0500) BP: (107-123)/(65-74) 115/65 mmHg (11/16 0500) SpO2:  [98 %-99 %] 98 % (11/16 0500) Last BM Date: 05/06/13  Intake/Output from previous day: 11/15 0701 - 11/16 0700 In: 0  Out: 1300 [Urine:1300] Intake/Output this shift: Total I/O In: 0  Out: 500 [Urine:500]  General appearance: alert and no distress GI: mild tenderness in the epigastrium  Lab Results:  Recent Labs  05/07/13 2037 05/08/13 0425 05/09/13 0423  WBC 8.5 8.7 7.4  HGB 13.1 12.7 11.7*  HCT 37.1 36.5 33.8*  PLT 237 241 213   BMET  Recent Labs  05/07/13 2037 05/08/13 0425 05/09/13 0423  NA 135 137 135  K 3.3* 3.2* 4.4  CL 102 104 103  CO2 25 26 21   GLUCOSE 93 92 45*  BUN 7 8 7   CREATININE 0.53 0.56 0.49*  CALCIUM 8.4 8.0* 8.1*   LFT  Recent Labs  05/08/13 0425  PROT 5.7*  ALBUMIN 3.1*  AST 26  ALT 17  ALKPHOS 105  BILITOT 0.3   PT/INR No results found for this basename: LABPROT, INR,  in the last 72 hours Hepatitis Panel No results found for this basename: HEPBSAG, HCVAB, HEPAIGM, HEPBIGM,  in the last 72 hours C-Diff No results found for this basename: CDIFFTOX,  in the last 72 hours Fecal Lactopherrin No results found for this basename: FECLLACTOFRN,  in the last 72 hours  Studies/Results: Dg Abd Acute W/chest  05/07/2013   CLINICAL DATA:  Post ERCP earlier today, now with epigastric abdominal pain  EXAM: ACUTE ABDOMEN SERIES (ABDOMEN 2 VIEW & CHEST 1 VIEW)  COMPARISON:  ERCP -earlier same day; chest radiograph - 04/02/2013; CT abdomen pelvis -10/30/2012  FINDINGS: Grossly unchanged cardiac silhouette and mediastinal contours. The lungs appear hyperexpanded with flattening of the bilateral hemidiaphragms and mild diffuse slightly nodular  thickening of the pulmonary interstitium. No focal airspace opacities. No pleural effusion or pneumothorax. No evidence of edema.  Nonobstructive bowel gas pattern. Enteric contrast is seen within the colon. No evidence of obstruction. No pneumoperitoneum, pneumatosis or portal venous gas.  Post cholecystectomy.  Post long segment paraspinal thoracolumbar fusion and lower cervical ACDF, incompletely evaluated. Persistent moderate scoliotic curvature of the thoracolumbar spine.  IMPRESSION: 1. No definite evidence of complication post recent ERCP. Specifically, no evidence of pneumoperitoneum or enteric obstruction. 2. Hyperexpanded lungs without acute cardiopulmonary disease. 3. Long segment paraspinal thoracolumbar fusion and lower cervical ACDF, incompletely evaluated.   Electronically Signed   By: Simonne Come M.D.   On: 05/07/2013 20:25    Medications:  Scheduled: . amitriptyline  30 mg Oral QHS  . antiseptic oral rinse  15 mL Mouth Rinse BID  . enoxaparin (LOVENOX) injection  40 mg Subcutaneous Q24H  . gabapentin  1,200 mg Oral QHS  . gabapentin  900 mg Oral Daily  . HYDROmorphone PCA 0.3 mg/mL   Intravenous Q4H  . nicotine  14 mg Transdermal Daily  . rOPINIRole  0.25 mg Oral QHS  . sertraline  100 mg Oral q morning - 10a   Continuous: . 0.9 % NaCl with KCl 40 mEq / L 75 mL/hr at 05/09/13 0430    Assessment/Plan: 1) Post-ERCP pancreatitis.   Clinically she has improved.  I will try her  on a clear liquid diet.  Continue with PCA for now.  Plan: 1) Clear liquids. 2) ? D/C home tomorrow.   LOS: 2 days   Onalee Steinbach D 05/09/2013, 8:41 AM

## 2013-05-09 NOTE — Progress Notes (Signed)
Patient ID: Sharon Welch, female   DOB: 1970/09/21, 42 y.o.   MRN: [id number] [facility] PROGRESS NOTE  EMILYGRACE GROTHE WUJ:[id number] DOB: 1971/02/15 DOA: 05/07/2013 PCP: Thora Lance, MD  Brief narrative:  42 y.o. female who had an ERCP 11/14 by Dr. Elnoria Howard, presenting the Emergency Department with a chief complaint of worsening epigastric pain since the morning of the admission after her procedure. She reported the pain started prior to discharge after her ERCP, 2/10 at discharge and on admission up to 5/10, non radiating, worse with oral intake and no specific alleviating factors. In the ER, her lipase was found to be > 3,000. ED spoke with Dr. Elnoria Howard- asked medicine to admit for pancreatitis post ERCP.  Active Problems:  Pancreatitis, acute  - lipase trending down - continue supportive care with IVF, analgesia and antiemetics as needed  - repeat lipase in AM  - may be able to advance diet to clears if GI agrees  Hypokalemia  - supplemented and within normal limits this AM - BMP in AM   Consultants:  GI Procedures/Studies:  Dg Ercp Biliary & Pancreatic Ducts 05/07/2013  Five spot films were obtained during an ERCP. 3 min and 24 seconds of fluoroscopy was utilized. Injection of the common bile duct demonstrates dilatation. A balloon sweep of the duct was performed. No definitive filling defects are noted.  Dg Abd Acute W/chest 05/07/2013  1. No definite evidence of complication post recent ERCP. Specifically, no evidence of pneumoperitoneum or enteric obstruction.  2. Hyperexpanded lungs without acute cardiopulmonary disease.  3. Long segment paraspinal thoracolumbar fusion and lower cervical ACDF, incompletely evaluated.  Antibiotics:  None  Code Status: Full  Family Communication: Pt at bedside  Disposition Plan: Home when medically stable  HPI/Subjective: No events overnight.   Objective: Filed Vitals:   05/09/13 0000 05/09/13 0400 05/09/13 0444 05/09/13 0500  BP:     115/65  Pulse:    97  Temp:    99 F (37.2 C)  TempSrc:    Oral  Resp: 12 14 16 14   Height:      Weight:      SpO2: 98% 98% 98% 98%    Intake/Output Summary (Last 24 hours) at 05/09/13 0714 Last data filed at 05/09/13 0500  Gross per 24 hour  Intake      0 ml  Output   1300 ml  Net  -1300 ml    Exam:   General:  Pt is alert, follows commands appropriately, not in acute distress  Cardiovascular: Regular rate and rhythm, S1/S2, no murmurs, no rubs, no gallops  Respiratory: Clear to auscultation bilaterally, no wheezing, no crackles, no rhonchi  Abdomen: Soft, mild tenderness in epigastric area, non distended, bowel sounds present, no guarding  Extremities: No edema, pulses DP and PT palpable bilaterally  Neuro: Grossly nonfocal  Data Reviewed: Basic Metabolic Panel:  Recent Labs Lab 05/07/13 2037 05/08/13 0425 05/09/13 0423  NA 135 137 135  K 3.3* 3.2* 4.4  CL 102 104 103  CO2 25 26 21   GLUCOSE 93 92 45*  BUN 7 8 7   CREATININE 0.53 0.56 0.49*  CALCIUM 8.4 8.0* 8.1*   Liver Function Tests:  Recent Labs Lab 05/07/13 2037 05/08/13 0425  AST 20 26  ALT 14 17  ALKPHOS 101 105  BILITOT 0.3 0.3  PROT 6.1 5.7*  ALBUMIN 3.4* 3.1*    Recent Labs Lab 05/07/13 2037 05/08/13 0425 05/09/13 0423  LIPASE >3000* >3000* 208*   CBC:  Recent Labs Lab 05/07/13 2037 05/08/13 0425 05/09/13 0423  WBC 8.5 8.7 7.4  NEUTROABS 6.1  --   --   HGB 13.1 12.7 11.7*  HCT 37.1 36.5 33.8*  MCV 95.1 96.1 96.3  PLT 237 241 213   Scheduled Meds: . amitriptyline  30 mg Oral QHS  . antiseptic oral rinse  15 mL Mouth Rinse BID  . enoxaparin (LOVENOX) injection  40 mg Subcutaneous Q24H  . gabapentin  1,200 mg Oral QHS  . gabapentin  900 mg Oral Daily  . HYDROmorphone PCA 0.3 mg/mL   Intravenous Q4H  . nicotine  14 mg Transdermal Daily  . rOPINIRole  0.25 mg Oral QHS  . sertraline  100 mg Oral q morning - 10a   Continuous Infusions: . 0.9 % NaCl with KCl 40 mEq /  L 75 mL/hr at 05/09/13 0430   Debbora Presto, MD  [facility] Pager [phone]  If 7PM-7AM, please contact night-coverage [email or web address] Password [email or web address] 05/09/2013, 7:14 AM   LOS: 2 days

## 2013-05-10 ENCOUNTER — Encounter (HOSPITAL_COMMUNITY): Payer: Self-pay

## 2013-05-10 ENCOUNTER — Encounter (HOSPITAL_COMMUNITY): Payer: Self-pay | Admitting: Gastroenterology

## 2013-05-10 LAB — CBC
HCT: 34.1 % — ABNORMAL LOW (ref 36.0–46.0)
MCH: 33.8 pg (ref 26.0–34.0)
MCHC: 35.5 g/dL (ref 30.0–36.0)
MCV: 95.3 fL (ref 78.0–100.0)
RDW: 11.6 % (ref 11.5–15.5)
WBC: 7 10*3/uL (ref 4.0–10.5)

## 2013-05-10 LAB — BASIC METABOLIC PANEL
BUN: <3 mg/dL — ABNORMAL LOW (ref 6–23)
Calcium: 8.3 mg/dL — ABNORMAL LOW (ref 8.4–10.5)
Chloride: 105 mEq/L (ref 96–112)
Creatinine, Ser: 0.44 mg/dL — ABNORMAL LOW (ref 0.50–1.10)
GFR calc Af Amer: >90 mL/min (ref >90)
Glucose, Bld: 89 mg/dL (ref 70–99)
Potassium: 5.5 mEq/L — ABNORMAL HIGH (ref 3.5–5.1)

## 2013-05-10 MED ORDER — HYDROMORPHONE HCL 4 MG PO TABS: 4.0000 mg | ORAL_TABLET | ORAL | Status: DC | PRN

## 2013-05-10 MED ORDER — ONDANSETRON HCL 4 MG PO TABS: 4.0000 mg | ORAL_TABLET | Freq: Four times a day (QID) | ORAL | Status: DC | PRN

## 2013-05-10 MED ORDER — ZOLPIDEM TARTRATE 10 MG PO TABS: 5.0000 mg | ORAL_TABLET | Freq: Every evening | ORAL | Status: DC | PRN

## 2013-05-10 MED ORDER — NEOMYCIN-POLYMYXIN-PRAMOXINE 1 % EX CREA: TOPICAL_CREAM | Freq: Two times a day (BID) | CUTANEOUS | Status: DC

## 2013-05-10 NOTE — Discharge Summary (Signed)
Physician Discharge Summary  Sharon Welch EGB:[id number] DOB: 03/25/1971 DOA: 05/07/2013  PCP: Thora Lance, MD  Admit date: 05/07/2013 Discharge date: 05/10/2013  Recommendations for Outpatient Follow-up:  1. Pt will need to follow up with PCP in 2-3 weeks post discharge 2. Please obtain BMP to evaluate electrolytes and kidney function 3. Please also check CBC to evaluate Hg and Hct levels 4. Pt advised to follow up with her GI doctor  Discharge Diagnoses: Acute pancreatitis post ERCP  Active Problems:   Hypokalemia   Pancreatitis, acute  Discharge Condition: Stable  Diet recommendation: Heart healthy diet discussed in details   Brief narrative:  42 y.o. female who had an ERCP 11/14 by Dr. Elnoria Howard, presenting the Emergency Department with a chief complaint of worsening epigastric pain since the morning of the admission after her procedure. She reported the pain started prior to discharge after her ERCP, 2/10 at discharge and on admission up to 5/10, non radiating, worse with oral intake and no specific alleviating factors. In the ER, her lipase was found to be > 3,000. ED spoke with Dr. Elnoria Howard- asked medicine to admit for pancreatitis post ERCP.   Active Problems:  Pancreatitis, acute  - lipase trending down and is within normal limits this AM - continued supportive care with IVF, analgesia and antiemetics as needed  - pt has responded well and tolerating regular diet well this AM - pt wants to go home and she has been cleared for d/c  Hypokalemia  - supplemented and within normal limits this AM   Consultants:  GI Procedures/Studies:  Dg Ercp Biliary & Pancreatic Ducts 05/07/2013  Five spot films were obtained during an ERCP. 3 min and 24 seconds of fluoroscopy was utilized. Injection of the common bile duct demonstrates dilatation. A balloon sweep of the duct was performed. No definitive filling defects are noted.  Dg Abd Acute W/chest 05/07/2013  1. No definite evidence of  complication post recent ERCP. Specifically, no evidence of pneumoperitoneum or enteric obstruction.  2. Hyperexpanded lungs without acute cardiopulmonary disease.  3. Long segment paraspinal thoracolumbar fusion and lower cervical ACDF, incompletely evaluated.  Antibiotics:  None  Code Status: Full  Family Communication: Pt at bedside    Discharge Exam: Filed Vitals:   05/10/13 0459  BP: 104/68  Pulse: 85  Temp: 98.7 F (37.1 C)  Resp: 16   Filed Vitals:   05/09/13 2101 05/10/13 0000 05/10/13 0400 05/10/13 0459  BP: 106/67   104/68  Pulse: 67   85  Temp: 98.3 F (36.8 C)   98.7 F (37.1 C)  TempSrc: Oral   Oral  Resp: 17 18 16 16   Height:      Weight:      SpO2: 100% 100% 99% 100%    General: Pt is alert, follows commands appropriately, not in acute distress Cardiovascular: Regular rate and rhythm, S1/S2 +, no murmurs, no rubs, no gallops Respiratory: Clear to auscultation bilaterally, no wheezing, no crackles, no rhonchi Abdominal: Soft, non tender, non distended, bowel sounds +, no guarding Extremities: no edema, no cyanosis, pulses palpable bilaterally DP and PT Neuro: Grossly nonfocal  Discharge Instructions  Discharge Orders   Future Orders Complete By Expires   Diet - low sodium heart healthy  As directed    Increase activity slowly  As directed        Medication List         acetaminophen 500 MG tablet  Commonly known as:  TYLENOL  Take 1,000 mg by  mouth every 6 (six) hours as needed for moderate pain.     amitriptyline 10 MG tablet  Commonly known as:  ELAVIL  Take 30 mg by mouth at bedtime.     gabapentin 300 MG capsule  Commonly known as:  NEURONTIN  Take 900-1,200 mg by mouth 2 (two) times daily. Take 3 capsule in the morning and 4 capsule at night     HYDROcodone-acetaminophen 10-325 MG per tablet  Commonly known as:  NORCO  Take 1 tablet by mouth every 4 (four) hours as needed for severe pain.     HYDROmorphone 4 MG tablet  Commonly  known as:  DILAUDID  Take 1 tablet (4 mg total) by mouth every 4 (four) hours as needed for moderate pain or severe pain (refractory pain).     multivitamin with minerals Tabs tablet  Take 1 tablet by mouth daily.     neomycin-polymyxin-pramoxine 1 % cream  Commonly known as:  [employer] ANTIBIOTIC PLUS  Apply topically 2 (two) times daily.     ondansetron 4 MG tablet  Commonly known as:  ZOFRAN  Take 1 tablet (4 mg total) by mouth every 6 (six) hours as needed for nausea.     rOPINIRole 0.25 MG tablet  Commonly known as:  REQUIP  Take 0.25 mg by mouth at bedtime.     sertraline 100 MG tablet  Commonly known as:  ZOLOFT  Take 100 mg by mouth every morning.     tiZANidine 2 MG tablet  Commonly known as:  ZANAFLEX  Take 2 mg by mouth every 6 (six) hours as needed (for muscle spasms).     zolpidem 10 MG tablet  Commonly known as:  AMBIEN  Take 0.5 tablets (5 mg total) by mouth at bedtime as needed for sleep.           Follow-up Information   Follow up with Thora Lance, MD In 2 weeks.   Specialty:  Family Medicine   Contact information:   [address], [address] [place] [address] [phone]       Follow up with Theda Belfast, MD. (As needed if symptoms worsen)    Specialty:  Gastroenterology   Contact information:   [address] [place] [address] [phone]       Follow up with Debbora Presto, MD. (As needed if symptoms worsen)    Specialty:  Internal Medicine   Contact information:   [address] [place] [address] [phone]        The results of significant diagnostics from this hospitalization (including imaging, microbiology, ancillary and laboratory) are listed below for reference.     Microbiology: No results found for this or any previous visit (from the past 240 hour(s)).   Labs: Basic Metabolic Panel:  Recent Labs Lab 05/07/13 2037 05/08/13 0425 05/09/13 0423 05/10/13 0410  NA 135 137 135  138  K 3.3* 3.2* 4.4 5.5*  CL 102 104 103 105  CO2 25 26 21 26   GLUCOSE 93 92 45* 89  BUN 7 8 7  <3*  CREATININE 0.53 0.56 0.49* 0.44*  CALCIUM 8.4 8.0* 8.1* 8.3*   Liver Function Tests:  Recent Labs Lab 05/07/13 2037 05/08/13 0425  AST 20 26  ALT 14 17  ALKPHOS 101 105  BILITOT 0.3 0.3  PROT 6.1 5.7*  ALBUMIN 3.4* 3.1*    Recent Labs Lab 05/07/13 2037 05/08/13 0425 05/09/13 0423 05/10/13 0410  LIPASE >3000* >3000* 208* 47   CBC:  Recent Labs Lab 05/07/13 2037 05/08/13 0425 05/09/13 0423 05/10/13 0410  WBC 8.5 8.7 7.4 7.0  NEUTROABS 6.1  --   --   --   HGB 13.1 12.7 11.7* 12.1  HCT 37.1 36.5 33.8* 34.1*  MCV 95.1 96.1 96.3 95.3  PLT 237 241 213 210     SIGNED: Time coordinating discharge: Over 30 minutes  Debbora Presto, MD  [facility] 05/10/2013, 11:37 AM Pager [phone]  If 7PM-7AM, please contact night-coverage [email or web address] Password [email or web address]

## 2013-05-10 NOTE — Progress Notes (Signed)
Pt used 4.2 mg PCA Dilaudid cleared at 0001.

## 2013-05-10 NOTE — Progress Notes (Signed)
Pt used 1.2 mg PCA Dilaudid from 0001 to 0400.

## 2013-05-10 NOTE — Progress Notes (Signed)
Subjective: No acute events.  Tolerated PO without pain.  Minimal to no pancreatic pain at this time.  Objective: Vital signs in last 24 hours: Temp:  [98.2 F (36.8 C)-98.7 F (37.1 C)] 98.7 F (37.1 C) (11/17 0459) Pulse Rate:  [67-85] 85 (11/17 0459) Resp:  [14-18] 16 (11/17 0459) BP: (104-106)/(67-69) 104/68 mmHg (11/17 0459) SpO2:  [99 %-100 %] 100 % (11/17 0459) Last BM Date: 05/07/13  Intake/Output from previous day: 11/16 0701 - 11/17 0700 In: 2280 [P.O.:2280] Out: 1300 [Urine:1300] Intake/Output this shift:    General appearance: alert and no distress GI: soft, non-tender; bowel sounds normal; no masses,  no organomegaly  Lab Results:  Recent Labs  05/08/13 0425 05/09/13 0423 05/10/13 0410  WBC 8.7 7.4 7.0  HGB 12.7 11.7* 12.1  HCT 36.5 33.8* 34.1*  PLT 241 213 210   BMET  Recent Labs  05/08/13 0425 05/09/13 0423 05/10/13 0410  NA 137 135 138  K 3.2* 4.4 5.5*  CL 104 103 105  CO2 26 21 26   GLUCOSE 92 45* 89  BUN 8 7 <3*  CREATININE 0.56 0.49* 0.44*  CALCIUM 8.0* 8.1* 8.3*   LFT  Recent Labs  05/08/13 0425  PROT 5.7*  ALBUMIN 3.1*  AST 26  ALT 17  ALKPHOS 105  BILITOT 0.3   PT/INR No results found for this basename: LABPROT, INR,  in the last 72 hours Hepatitis Panel No results found for this basename: HEPBSAG, HCVAB, HEPAIGM, HEPBIGM,  in the last 72 hours C-Diff No results found for this basename: CDIFFTOX,  in the last 72 hours Fecal Lactopherrin No results found for this basename: FECLLACTOFRN,  in the last 72 hours  Studies/Results: No results found.  Medications:  Scheduled: . [facility] HOLD] amitriptyline  30 mg Oral QHS  . [facility] HOLD] antiseptic oral rinse  15 mL Mouth Rinse BID  . [[facility] HOLD] enoxaparin (LOVENOX) injection  40 mg Subcutaneous Q24H  . [facility] HOLD] gabapentin  1,200 mg Oral QHS  . [facility] HOLD] gabapentin  900 mg Oral Daily  . [MAR HOLD] HYDROmorphone PCA 0.3 mg/mL   Intravenous Q4H  . [facility] HOLD] nicotine  14 mg  Transdermal Daily  . [MAR HOLD] rOPINIRole  0.25 mg Oral QHS  . [facility] HOLD] sertraline  100 mg Oral q morning - 10a   Continuous: . 0.9 % NaCl with KCl 40 mEq / L 75 mL/hr at 05/10/13 7829    Assessment/Plan: 1) Mild Post ERCP pancreatitis   She is well and she can be discharged home today.  Plan: 1) D/C Home. 2) I will have her follow up with me in 1 month. 3) Advance diet.   LOS: 3 days   Ryken Paschal D 05/10/2013, 7:50 AM

## 2013-05-10 NOTE — Care Management Note (Signed)
    Page 1 of 1   05/10/2013     5:23:09 PM   CARE MANAGEMENT NOTE 05/10/2013  Patient:  Sharon Welch, Sharon Welch   Account Number:  [id number]  Date Initiated:  05/08/2013  Documentation initiated by:  [facility]  Subjective/Objective Assessment:   42 year old female admitted with pancreatitis.     Action/Plan:   From home.   Anticipated DC Date:  05/11/2013   Anticipated DC Plan:  HOME/SELF CARE      DC Planning Services  CM consult      Choice offered to / List presented to:             Status of service:  Completed, signed off Medicare Important Message given?  NA - LOS <3 / Initial given by admissions (If response is "NO", the following Medicare IM given date fields will be blank) Date Medicare IM given:   Date Additional Medicare IM given:    Discharge Disposition:  HOME/SELF CARE  Per UR Regulation:  Reviewed for med. necessity/level of care/duration of stay  If discussed at Long Length of Stay Meetings, dates discussed:    Comments:  05/10/2013 Colleen Can BSN RN CCM [phone] PT PLANS TO RETURN TO HER HOME WHWER SHE WILL HAVE SUPPORT FROM FAMILY MEMBERS. THERE ARE NO HH OR DME NEEDS. PCP-DR George Ina HAS PRESCRIPTION COVERAGE AND USES

## 2013-05-18 ENCOUNTER — Emergency Department (HOSPITAL_COMMUNITY)
Admission: EM | Admit: 2013-05-18 | Discharge: 2013-05-19 | Disposition: A | Discharge status: Home / Self Care | Payer: Medicaid Other | Attending: Emergency Medicine | Admitting: Emergency Medicine

## 2013-05-18 ENCOUNTER — Encounter (HOSPITAL_COMMUNITY): Payer: Self-pay | Admitting: Emergency Medicine

## 2013-05-18 DIAGNOSIS — Z3202 Encounter for pregnancy test, result negative: Secondary | ICD-10-CM | POA: Insufficient documentation

## 2013-05-18 DIAGNOSIS — Z8739 Personal history of other diseases of the musculoskeletal system and connective tissue: Secondary | ICD-10-CM | POA: Insufficient documentation

## 2013-05-18 DIAGNOSIS — Z8669 Personal history of other diseases of the nervous system and sense organs: Secondary | ICD-10-CM | POA: Insufficient documentation

## 2013-05-18 DIAGNOSIS — Z8742 Personal history of other diseases of the female genital tract: Secondary | ICD-10-CM | POA: Insufficient documentation

## 2013-05-18 DIAGNOSIS — Z79899 Other long term (current) drug therapy: Secondary | ICD-10-CM | POA: Insufficient documentation

## 2013-05-18 DIAGNOSIS — R1013 Epigastric pain: Secondary | ICD-10-CM | POA: Insufficient documentation

## 2013-05-18 DIAGNOSIS — R350 Frequency of micturition: Secondary | ICD-10-CM | POA: Insufficient documentation

## 2013-05-18 DIAGNOSIS — Z792 Long term (current) use of antibiotics: Secondary | ICD-10-CM | POA: Insufficient documentation

## 2013-05-18 DIAGNOSIS — G8929 Other chronic pain: Secondary | ICD-10-CM | POA: Insufficient documentation

## 2013-05-18 DIAGNOSIS — F17200 Nicotine dependence, unspecified, uncomplicated: Secondary | ICD-10-CM | POA: Insufficient documentation

## 2013-05-18 DIAGNOSIS — R110 Nausea: Secondary | ICD-10-CM | POA: Insufficient documentation

## 2013-05-18 MED ORDER — ONDANSETRON HCL 4 MG/2ML IJ SOLN
4.0000 mg | Freq: Once | INTRAMUSCULAR | Status: AC
  Administered 2013-05-19: 4 mg via INTRAVENOUS
  Filled 2013-05-18: qty 2

## 2013-05-18 MED ORDER — HYDROMORPHONE HCL PF 1 MG/ML IJ SOLN
1.0000 mg | Freq: Once | INTRAMUSCULAR | Status: AC
  Administered 2013-05-19: 1 mg via INTRAVENOUS
  Filled 2013-05-18: qty 1

## 2013-05-18 MED ORDER — SODIUM CHLORIDE 0.9 % IV BOLUS (SEPSIS)
1000.0000 mL | Freq: Once | INTRAVENOUS | Status: AC
  Administered 2013-05-19: 1000 mL via INTRAVENOUS

## 2013-05-18 NOTE — ED Notes (Signed)
Pt states off and on pain for two days then today at 5pm  Pain started and didn't let up and she states two weeks ago she was admitted for pancreatitits

## 2013-05-18 NOTE — ED Provider Notes (Signed)
CSN: [id number]     Arrival date & time 05/18/13  2257 History   First MD Initiated Contact with Patient 05/18/13 2306     Chief Complaint  Patient presents with  . Abdominal Pain  . Nausea   (Consider location/radiation/quality/duration/timing/severity/associated sxs/prior Treatment) Patient is a 42 y.o. female presenting with abdominal pain.  Abdominal Pain Pain location:  Epigastric Pain quality: sharp   Pain radiates to:  Does not radiate Pain severity:  Severe Onset quality:  Gradual Duration:  2 days Timing:  Constant (intermittent at first, now constant) Progression:  Worsening Chronicity:  Recurrent (similar character of pain as when she had pancreatitis this month) Context comment:  ERCP earlier this month with subsequent development of pancreatitis Relieved by:  Nothing Worsened by:  Eating Ineffective treatments: pain and nausea medicine. Associated symptoms: nausea   Associated symptoms: no chest pain, no cough, no diarrhea, no dysuria, no fever, no shortness of breath and no vomiting     Past Medical History  Diagnosis Date  . Scoliosis   . Chronic back pain   . Seizures     started 9/12-  . Headache(784.0)   . Chronic back pain   . Partial tear subscapularis tendon 12/27/2011  . Ovarian cyst    Past Surgical History  Procedure Laterality Date  . Back surgery  1989    scoliosis throsic-rods  . Tubal ligation    . Cesarean section    . Abdominal hysterectomy  2012  . Left arm    . Eus N/A 11/03/2012    Procedure: UPPER ENDOSCOPIC ULTRASOUND (EUS) LINEAR;  Surgeon: Theda Belfast, MD;  Location: [facility] ENDOSCOPY;  Service: Endoscopy;  Laterality: N/A;  . Cholecystectomy N/A 11/17/2012    Procedure: LAPAROSCOPIC CHOLECYSTECTOMY WITH INTRAOPERATIVE CHOLANGIOGRAM;  Surgeon: Axel Filler, MD;  Location: [facility] OR;  Service: General;  Laterality: N/A;  . Neck surgery    . Ercp N/A 05/07/2013    Procedure: ENDOSCOPIC RETROGRADE CHOLANGIOPANCREATOGRAPHY (ERCP);   Surgeon: Theda Belfast, MD;  Location: [facility];  Service: Endoscopy;  Laterality: N/A;  start ercp first, if canulation fails switch to EUS    Family History  Problem Relation Age of Onset  . Hypertension Mother   . Hypertension Father   . Cancer Father     prostate  . Heart disease Father   . Hypertension Brother   . Hypertension Brother    History  Substance Use Topics  . Smoking status: Current Every Day Smoker -- 0.75 packs/day for 20 years    Types: Cigarettes  . Smokeless tobacco: Never Used  . Alcohol Use: Yes     Comment: 1-2 sips a years   OB History   Grav Para Term Preterm Abortions TAB SAB Ect Mult Living                 Review of Systems  Constitutional: Negative for fever.  HENT: Negative for congestion.   Respiratory: Negative for cough and shortness of breath.   Cardiovascular: Negative for chest pain.  Gastrointestinal: Positive for nausea and abdominal pain. Negative for vomiting and diarrhea.  Genitourinary: Positive for frequency. Negative for dysuria.  All other systems reviewed and are negative.    Allergies  Tramadol  Home Medications   Current Outpatient Rx  Name  Route  Sig  Dispense  Refill  . acetaminophen (TYLENOL) 500 MG tablet   Oral   Take 1,000 mg by mouth every 6 (six) hours as needed for moderate pain.          Marland Kitchen  amitriptyline (ELAVIL) 10 MG tablet   Oral   Take 30 mg by mouth at bedtime.          . gabapentin (NEURONTIN) 300 MG capsule   Oral   Take 900-1,200 mg by mouth 2 (two) times daily. Take 3 capsule in the morning and 4 capsule at night         . HYDROcodone-acetaminophen (NORCO) 10-325 MG per tablet   Oral   Take 1 tablet by mouth every 4 (four) hours as needed for severe pain.         Marland Kitchen HYDROmorphone (DILAUDID) 4 MG tablet   Oral   Take 1 tablet (4 mg total) by mouth every 4 (four) hours as needed for moderate pain or severe pain (refractory pain).   65 tablet   0   . Multiple Vitamin  (MULTIVITAMIN WITH MINERALS) TABS tablet   Oral   Take 1 tablet by mouth daily.         Marland Kitchen neomycin-polymyxin-pramoxine ([employer] ANTIBIOTIC PLUS) 1 % cream   Topical   Apply topically 2 (two) times daily.   14.2 g   2   . ondansetron (ZOFRAN) 4 MG tablet   Oral   Take 1 tablet (4 mg total) by mouth every 6 (six) hours as needed for nausea.   65 tablet   0   . rOPINIRole (REQUIP) 0.25 MG tablet   Oral   Take 0.25 mg by mouth at bedtime.         . sertraline (ZOLOFT) 100 MG tablet   Oral   Take 100 mg by mouth every morning.          Marland Kitchen tiZANidine (ZANAFLEX) 2 MG tablet   Oral   Take 2 mg by mouth every 6 (six) hours as needed (for muscle spasms).         . zolpidem (AMBIEN) 10 MG tablet   Oral   Take 0.5 tablets (5 mg total) by mouth at bedtime as needed for sleep.   30 tablet   0    BP 110/68  Pulse 94  Temp(Src) 98.3 F (36.8 C) (Oral)  Resp 16  Ht 5\' 1"  (1.549 m)  Wt 102 lb (46.267 kg)  BMI 19.28 kg/m2  SpO2 95% Physical Exam  Nursing note and vitals reviewed. Constitutional: She is oriented to person, place, and time. She appears well-developed and well-nourished. No distress.  HENT:  Head: Normocephalic and atraumatic.  Mouth/Throat: Oropharynx is clear and moist.  Eyes: Conjunctivae are normal. Pupils are equal, round, and reactive to light. No scleral icterus.  Neck: Neck supple.  Cardiovascular: Normal rate, regular rhythm, normal heart sounds and intact distal pulses.   No murmur heard. Pulmonary/Chest: Effort normal and breath sounds normal. No stridor. No respiratory distress. She has no rales.  Abdominal: Soft. Bowel sounds are normal. She exhibits no distension. There is tenderness in the epigastric area. There is no rigidity, no rebound and no guarding.  Musculoskeletal: Normal range of motion.  Neurological: She is alert and oriented to person, place, and time.  Skin: Skin is warm and dry. No rash noted.  Psychiatric: She has a normal mood  and affect. Her behavior is normal.    ED Course  Procedures (including critical care time) Labs Review Labs Reviewed  CBC WITH DIFFERENTIAL - Abnormal; Notable for the following:    Monocytes Absolute 1.2 (*)    All other components within normal limits  COMPREHENSIVE METABOLIC PANEL - Abnormal; Notable for the following:  Sodium 133 (*)    Total Bilirubin 0.1 (*)    All other components within normal limits  LACTIC ACID, PLASMA  LIPASE, BLOOD  URINALYSIS, ROUTINE W REFLEX MICROSCOPIC  POCT PREGNANCY, URINE   Imaging Review No results found.  EKG Interpretation   None       MDM   1. Abdominal pain, epigastric    42 yo female with epigastric abdominal pain which she describes as similar to recent episode of pancreatitis.  She is nontoxic, abd is soft but tender.  Plan labs, IV fluids, IV dilaudid, IV Zofran.   Labs unremarkable.  GI cocktail helped pain somewhat.  Repeat abd exam benign.  Symptoms possibly from gastritis.  Prescribed omeprazole and advised follow up with her Gastroenterologist.  Tolerated POs before discharge.  Return precautions given.   Candyce Churn, MD 05/19/13 2028

## 2013-05-19 LAB — CBC WITH DIFFERENTIAL/PLATELET
Basophils Relative: 0 % (ref 0–1)
Eosinophils Absolute: 0.1 10*3/uL (ref 0.0–0.7)
HCT: 37.5 % (ref 36.0–46.0)
Hemoglobin: 13.5 g/dL (ref 12.0–15.0)
Lymphocytes Relative: 24 % (ref 12–46)
MCH: 33.6 pg (ref 26.0–34.0)
MCHC: 36 g/dL (ref 30.0–36.0)
Monocytes Absolute: 1.2 10*3/uL — ABNORMAL HIGH (ref 0.1–1.0)
Monocytes Relative: 11 % (ref 3–12)
Neutro Abs: 6.5 10*3/uL (ref 1.7–7.7)
WBC: 10.3 10*3/uL (ref 4.0–10.5)

## 2013-05-19 LAB — POCT PREGNANCY, URINE: Preg Test, Ur: NEGATIVE

## 2013-05-19 LAB — URINALYSIS, ROUTINE W REFLEX MICROSCOPIC
Bilirubin Urine: NEGATIVE
Hgb urine dipstick: NEGATIVE
Ketones, ur: NEGATIVE mg/dL
Nitrite: NEGATIVE
Protein, ur: NEGATIVE mg/dL
Urobilinogen, UA: 0.2 mg/dL (ref 0.0–1.0)

## 2013-05-19 LAB — COMPREHENSIVE METABOLIC PANEL
Albumin: 3.6 g/dL (ref 3.5–5.2)
BUN: 11 mg/dL (ref 6–23)
CO2: 26 mEq/L (ref 19–32)
Chloride: 98 mEq/L (ref 96–112)
Creatinine, Ser: 0.74 mg/dL (ref 0.50–1.10)
GFR calc Af Amer: >90 mL/min (ref >90)
GFR calc non Af Amer: >90 mL/min (ref >90)
Glucose, Bld: 85 mg/dL (ref 70–99)
Total Bilirubin: 0.1 mg/dL — ABNORMAL LOW (ref 0.3–1.2)

## 2013-05-19 LAB — LACTIC ACID, PLASMA: Lactic Acid, Venous: 0.8 mmol/L (ref 0.5–2.2)

## 2013-05-19 LAB — LIPASE, BLOOD: Lipase: 38 U/L (ref 11–59)

## 2013-05-19 MED ORDER — OMEPRAZOLE 20 MG PO CPDR: 20.0000 mg | DELAYED_RELEASE_CAPSULE | Freq: Every day | ORAL | Status: DC

## 2013-05-19 MED ORDER — GI COCKTAIL ~~LOC~~
30.0000 mL | Freq: Once | ORAL | Status: AC
  Administered 2013-05-19: 30 mL via ORAL
  Filled 2013-05-19: qty 30

## 2013-05-19 MED ORDER — HYDROMORPHONE HCL PF 1 MG/ML IJ SOLN
1.0000 mg | Freq: Once | INTRAMUSCULAR | Status: AC
  Administered 2013-05-19: 1 mg via INTRAVENOUS
  Filled 2013-05-19: qty 1

## 2013-06-02 ENCOUNTER — Ambulatory Visit: Payer: Self-pay | Admitting: Pain Medicine

## 2013-07-01 ENCOUNTER — Ambulatory Visit: Payer: Self-pay | Admitting: Pain Medicine

## 2013-07-12 ENCOUNTER — Other Ambulatory Visit: Payer: Self-pay

## 2013-07-12 DIAGNOSIS — Z1231 Encounter for screening mammogram for malignant neoplasm of breast: Secondary | ICD-10-CM

## 2013-07-14 ENCOUNTER — Ambulatory Visit: Payer: Medicaid Other

## 2013-07-28 ENCOUNTER — Ambulatory Visit: Payer: Self-pay | Admitting: Pain Medicine

## 2013-07-30 ENCOUNTER — Ambulatory Visit
Admission: RE | Admit: 2013-07-30 | Discharge: 2013-07-30 | Disposition: A | Discharge status: Home / Self Care | Payer: Medicaid Other | Source: Ambulatory Visit

## 2013-07-30 DIAGNOSIS — Z1231 Encounter for screening mammogram for malignant neoplasm of breast: Secondary | ICD-10-CM

## 2013-08-05 DIAGNOSIS — G4700 Insomnia, unspecified: Secondary | ICD-10-CM | POA: Diagnosis not present

## 2013-08-10 ENCOUNTER — Other Ambulatory Visit: Payer: Self-pay | Admitting: Orthopedic Surgery

## 2013-08-10 DIAGNOSIS — M25512 Pain in left shoulder: Secondary | ICD-10-CM

## 2013-08-23 ENCOUNTER — Ambulatory Visit
Admission: RE | Admit: 2013-08-23 | Discharge: 2013-08-23 | Disposition: A | Discharge status: Home / Self Care | Payer: Medicaid Other | Source: Ambulatory Visit | Attending: Orthopedic Surgery | Admitting: Orthopedic Surgery

## 2013-08-23 ENCOUNTER — Other Ambulatory Visit: Payer: Self-pay | Admitting: Orthopedic Surgery

## 2013-08-23 DIAGNOSIS — M25512 Pain in left shoulder: Secondary | ICD-10-CM

## 2013-08-23 MED ORDER — IOHEXOL 180 MG/ML  SOLN
15.0000 mL | Freq: Once | INTRAMUSCULAR | Status: AC | PRN
  Administered 2013-08-23: 15 mL via INTRA_ARTICULAR

## 2013-08-23 MED ORDER — METHYLPREDNISOLONE ACETATE 40 MG/ML INJ SUSP (RADIOLOG
120.0000 mg | Freq: Once | INTRAMUSCULAR | Status: AC
  Administered 2013-08-23: 120 mg via INTRA_ARTICULAR

## 2013-08-24 ENCOUNTER — Ambulatory Visit: Payer: Self-pay | Admitting: Pain Medicine

## 2013-08-30 DIAGNOSIS — M542 Cervicalgia: Secondary | ICD-10-CM | POA: Diagnosis not present

## 2013-08-30 DIAGNOSIS — Z4789 Encounter for other orthopedic aftercare: Secondary | ICD-10-CM | POA: Diagnosis not present

## 2013-08-30 DIAGNOSIS — M549 Dorsalgia, unspecified: Secondary | ICD-10-CM | POA: Diagnosis not present

## 2013-08-30 DIAGNOSIS — Z981 Arthrodesis status: Secondary | ICD-10-CM | POA: Diagnosis not present

## 2013-09-01 ENCOUNTER — Ambulatory Visit: Payer: Self-pay | Admitting: Pain Medicine

## 2013-09-06 DIAGNOSIS — Z4789 Encounter for other orthopedic aftercare: Secondary | ICD-10-CM | POA: Diagnosis not present

## 2013-09-06 DIAGNOSIS — M4120 Other idiopathic scoliosis, site unspecified: Secondary | ICD-10-CM | POA: Diagnosis not present

## 2013-09-06 DIAGNOSIS — M549 Dorsalgia, unspecified: Secondary | ICD-10-CM | POA: Diagnosis not present

## 2013-09-06 DIAGNOSIS — M545 Low back pain, unspecified: Secondary | ICD-10-CM | POA: Diagnosis not present

## 2013-09-13 DIAGNOSIS — M4120 Other idiopathic scoliosis, site unspecified: Secondary | ICD-10-CM | POA: Diagnosis not present

## 2013-09-22 DIAGNOSIS — G4700 Insomnia, unspecified: Secondary | ICD-10-CM | POA: Diagnosis not present

## 2013-09-28 ENCOUNTER — Ambulatory Visit: Payer: Self-pay | Admitting: Pain Medicine

## 2013-10-04 ENCOUNTER — Ambulatory Visit: Payer: Self-pay | Admitting: Pain Medicine

## 2013-10-18 DIAGNOSIS — Z4789 Encounter for other orthopedic aftercare: Secondary | ICD-10-CM | POA: Diagnosis not present

## 2013-10-18 DIAGNOSIS — M539 Dorsopathy, unspecified: Secondary | ICD-10-CM | POA: Diagnosis not present

## 2013-10-18 DIAGNOSIS — Z981 Arthrodesis status: Secondary | ICD-10-CM | POA: Diagnosis not present

## 2013-10-19 DIAGNOSIS — J329 Chronic sinusitis, unspecified: Secondary | ICD-10-CM | POA: Diagnosis not present

## 2013-10-28 ENCOUNTER — Ambulatory Visit: Payer: Self-pay | Admitting: Pain Medicine

## 2013-10-28 DIAGNOSIS — G43909 Migraine, unspecified, not intractable, without status migrainosus: Secondary | ICD-10-CM | POA: Diagnosis not present

## 2013-10-28 DIAGNOSIS — F411 Generalized anxiety disorder: Secondary | ICD-10-CM | POA: Diagnosis not present

## 2013-11-29 ENCOUNTER — Ambulatory Visit: Payer: Self-pay | Admitting: Pain Medicine

## 2013-12-01 DIAGNOSIS — R233 Spontaneous ecchymoses: Secondary | ICD-10-CM | POA: Diagnosis not present

## 2013-12-06 ENCOUNTER — Ambulatory Visit (INDEPENDENT_AMBULATORY_CARE_PROVIDER_SITE_OTHER): Payer: Medicare Other | Admitting: Oncology

## 2013-12-06 ENCOUNTER — Encounter: Payer: Self-pay | Admitting: Oncology

## 2013-12-06 ENCOUNTER — Other Ambulatory Visit: Payer: Self-pay | Admitting: Oncology

## 2013-12-06 DIAGNOSIS — R233 Spontaneous ecchymoses: Secondary | ICD-10-CM

## 2013-12-06 LAB — CBC WITH DIFFERENTIAL/PLATELET
Basophils Absolute: 0.1 10*3/uL (ref 0.0–0.1)
Basophils Relative: 1 % (ref 0–1)
EOS ABS: 0.1 10*3/uL (ref 0.0–0.7)
Eosinophils Relative: 2 % (ref 0–5)
HCT: 38.2 % (ref 36.0–46.0)
Hemoglobin: 13.3 g/dL (ref 12.0–15.0)
LYMPHS PCT: 29 % (ref 12–46)
Lymphs Abs: 1.5 10*3/uL (ref 0.7–4.0)
MCH: 32.9 pg (ref 26.0–34.0)
MCHC: 34.8 g/dL (ref 30.0–36.0)
MCV: 94.6 fL (ref 78.0–100.0)
Monocytes Absolute: 0.7 10*3/uL (ref 0.1–1.0)
Monocytes Relative: 13 % — ABNORMAL HIGH (ref 3–12)
Neutro Abs: 2.9 10*3/uL (ref 1.7–7.7)
Neutrophils Relative %: 55 % (ref 43–77)
PLATELETS: 293 10*3/uL (ref 150–400)
RBC: 4.04 MIL/uL (ref 3.87–5.11)
RDW: 12.9 % (ref 11.5–15.5)
WBC: 5.3 10*3/uL (ref 4.0–10.5)

## 2013-12-06 LAB — SAVE SMEAR

## 2013-12-06 NOTE — Progress Notes (Signed)
Patient ID: Sharon Welch, female   DOB: 1970/06/29, 43 y.o.   MRN: [id number] New Patient Hematology   Sharon Welch [id number] 1970/09/28 43 y.o. 12/06/2013  CC: Dr. Donavan Burnet, Dr. Clovis Riley, Dr. Rigoberto Noel - orthopedic surgery, [facility] [place]   Reason for referral: Extensive spontaneous bruising   HPI:  43 year old woman with multiple chronic health problems. She has scoliosis. She underwent extensive spine surgery with placement of Harrington rods in 1989 at [facility] in [place]. She has had chronic back pain and takes numerous medications including gabapentin, amitriptyline, Zoloft, Tiazadine. She had dental extractions of two teeth about 10 days ago. She received conscious sedation and probably some nitrous oxide as well. About 3 days following the procedure she noted the sudden onset of extensive, spontaneous, bruising primarily on her legs with ecchymoses almost encircling the legs and then subsequently more ecchymoses developed on her arms. No other signs of bleeding with no epistaxis, hematuria, hematochezia, or vaginal bleeding. She was taking frequent doses of ibuprofen for about one week before and one week after the dental procedures. She had a sinus infection about 3 weeks ago and was treated with a course of Augmentin. She is status post hysterectomy but when she was menstruating, periods were occasionally heavy but not usually. When she gets cut, bleeding usually stops quickly. She has had numerous previous surgeries without any bleeding complications. (Surgery on her cervical spine one year ago with a bone reconstruction, graft,  and steel plate placement, left rotator cuff surgery 2 years ago, hysterectomy without oophorectomy 3 and half years ago). She's had no infectious exposures. Medication list is extensive but she has not started any new medications recently except the Augmentin. Alcohol use is very rare  maybe 3 mixed drinks per year. She has no history of liver disease and specifically denies history of hepatitis, yellow jaundice, or malaria. She did have mononucleosis 20 years ago. She has no signs or symptoms of a collagen vascular disorder. There is a family history of leukemia in a paternal grandfather and a paternal first cousin who died at age 19. Neither of  her siblings,  a brother age 6 and a  sister age 9, or her 2 children a son age 26 and a daughter age 55 have had problems with bleeding. She did have blood transfusions in the past but states that the transfusions were directed donations from family members. She has a number of chronic skin conditions. She has lichen planus first diagnosed about 10 years ago which then cleared up.  It just recurred about 3 months ago. This causes numerous lesions on her arms and legs. She was seen by a dermatologist when she had the recent recurrence and was treated with oral prednisone and a topical cream likely also a steroid. The lesions regressed. She believes the steroids may have caused her to swell up. She has type I neurofibromatosis. Additional skin lesions related to this.   PMH: Past Medical History  Diagnosis Date  . Scoliosis   . Chronic back pain   . Seizures: Possibly related to migraine headaches      started 9/12-  . Headache(784.0) migraines occur about 3 times per month    . Chronic back pain   . Partial tear subscapularis tendon 12/27/2011  . Ovarian cyst   She denies any history of hypertension, ulcers, diabetes, thyroid disease, heart murmur, hepatitis, yellow jaundice, malaria.  Past Surgical History  Procedure Laterality  Date  . Back surgery  1989    scoliosis throsic-rods  . Tubal ligation    . Cesarean section    . Abdominal hysterectomy  2012  . Left arm    . Eus N/A 11/03/2012    Procedure: UPPER ENDOSCOPIC ULTRASOUND (EUS) LINEAR;  Surgeon: Beryle Beams, MD;  Location: [facility];  Service: Endoscopy;   Laterality: N/A;  . Cholecystectomy N/A 11/17/2012    Procedure: LAPAROSCOPIC CHOLECYSTECTOMY WITH INTRAOPERATIVE CHOLANGIOGRAM;  Surgeon: Ralene Ok, MD;  Location: [facility];  Service: General;  Laterality: N/A;  . Neck surgery    . Ercp N/A 05/07/2013    Procedure: ENDOSCOPIC RETROGRADE CHOLANGIOPANCREATOGRAPHY (ERCP);  Surgeon: Beryle Beams, MD;  Location: [facility];  Service: Endoscopy;  Laterality: N/A;  start ercp first, if canulation fails switch to EUS     Allergies: Allergies  Allergen Reactions  . Tramadol Other (See Comments)    Possible seizures    Medications: Current outpatient prescriptions:acetaminophen (TYLENOL) 500 MG tablet, Take 1,000 mg by mouth every 6 (six) hours as needed for moderate pain. , Disp: , Rfl: ;  amitriptyline (ELAVIL) 10 MG tablet, Take 30 mg by mouth at bedtime. , Disp: , Rfl: ;  gabapentin (NEURONTIN) 300 MG capsule, Take 900-1,200 mg by mouth 2 (two) times daily. Take 3 capsule in the morning and 4 capsule at night, Disp: , Rfl:  HYDROcodone-acetaminophen (NORCO) 10-325 MG per tablet, Take 1 tablet by mouth every 4 (four) hours as needed for severe pain., Disp: , Rfl: ;  HYDROmorphone (DILAUDID) 4 MG tablet, Take 1 tablet (4 mg total) by mouth every 4 (four) hours as needed for moderate pain or severe pain (refractory pain)., Disp: 65 tablet, Rfl: 0;  magnesium oxide (MAG-OX) 400 MG tablet, Take 400 mg by mouth at bedtime., Disp: , Rfl:  Multiple Vitamin (MULTIVITAMIN WITH MINERALS) TABS tablet, Take 1 tablet by mouth daily., Disp: , Rfl: ;  neomycin-polymyxin-pramoxine ([employer] ANTIBIOTIC PLUS) 1 % cream, Apply topically 2 (two) times daily., Disp: 14.2 g, Rfl: 2;  omeprazole (PRILOSEC) 20 MG capsule, Take 1 capsule (20 mg total) by mouth daily., Disp: 14 capsule, Rfl: 2 ondansetron (ZOFRAN) 4 MG tablet, Take 1 tablet (4 mg total) by mouth every 6 (six) hours as needed for nausea., Disp: 65 tablet, Rfl: 0;  rOPINIRole (REQUIP) 0.25 MG tablet, Take 0.25 mg  by mouth at bedtime., Disp: , Rfl: ;  sertraline (ZOLOFT) 100 MG tablet, Take 100 mg by mouth every morning. , Disp: , Rfl: ;  tiZANidine (ZANAFLEX) 2 MG tablet, Take 2 mg by mouth every 6 (six) hours as needed (for muscle spasms)., Disp: , Rfl:  zolpidem (AMBIEN) 10 MG tablet, Take 0.5 tablets (5 mg total) by mouth at bedtime as needed for sleep., Disp: 30 tablet, Rfl: 0  Social History: Currently disabled. 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 No exposure to toxic chemicals or radiation.  reports that she quit smoking about 3 months ago. She was smoking one pack per day until then. Her smoking use included Cigarettes. She has a 15 pack-year smoking history. She has never used smokeless tobacco. She reports that she drinks alcohol very rarely. A mixed drink about 3 times per year.. She reports that she does not use illicit drugs and she specifically denied IV drug use..  Family History: Father died of complications of metastatic prostate cancer at age 34. He had easy bruisability but was taking aspirin and have underlying cardiac disease. Mother still alive and well  at age 43. Paternal grandfather died of leukemia. Paternal first cousin died at age 61 of leukemia. Brother age 69 sister age 58 both obese and hypertensive but no blood problems.  Review of Systems: Hematology: See history of present illness ENT ROS: negative for - oral lesions or sore throat Breast ROS: Respiratory ROS: negative for - cough, pleuritic pain, shortness of breath or wheezing Cardiovascular ROS: negative for - chest pain, dyspnea on exertion, edema, irregular heartbeat, murmur, orthopnea, palpitations, paroxysmal nocturnal dyspnea or rapid heart rate Gastrointestinal ROS: negative for - abdominal pain, appetite loss, blood in stools, change in bowel habits, constipation, diarrhea, heartburn, hematemesis, melena, nausea/vomiting or swallowing difficulty/pain Genito-Urinary ROS previous hysterectomy. No current vaginal  bleeding. Musculoskeletal ROS: negative for - joint pain, joint stiffness,. Chronic back pain. Related to scoliosis and surgery. Neurological ROS: negative for - behavioral changes, confusion, dizziness, gait disturbance, headaches, impaired coordination/balance, memory loss, numbness/tingling, Dermatological ROS: See history of present illness and physical exam Remaining ROS negative.  Physical Exam: Blood pressure 125/87, pulse 83, temperature 98.6 F (37 C), temperature source Oral, resp. rate 20, height 5' 0.5" (1.537 m), weight 104 lb 14.4 oz (47.582 kg), SpO2 98.00%. Wt Readings from Last 3 Encounters:  12/06/13 104 lb 14.4 oz (47.582 kg)  05/18/13 102 lb (46.267 kg)  05/08/13 102 lb (46.267 kg)     General appearance: [place] Caucasian female  HENNT: Pharynx no erythema, exudate, mass, or ulcer. No thyromegaly or thyroid nodules. Scar midline neck from previous cervical disc surgery. Lymph nodes: No cervical, supraclavicular, or axillary lymphadenopathy Breasts:  Lungs: Clear to auscultation, resonant to percussion throughout Heart: Regular rhythm, no murmur, no gallop, no rub, no click, no edema Abdomen: Soft, nontender, normal bowel sounds, no mass, no organomegaly Extremities: No edema, no calf tenderness Musculoskeletal: no joint deformities GU: Not examined Vascular: Carotid pulses 2+, no bruits, distal pulses: Dorsalis pedis 1+ symmetric Neurologic: Alert, oriented, PERRLA, optic discs sharp and vessels normal, no hemorrhage or exudate, cranial nerves grossly normal, motor strength 5 over 5, reflexes 1+ symmetric, upper body coordination normal, gait normal, Skin: Extensive areas of resolving ecchymoses primarily on the anterior thighs bilaterally with scattered lesions on the arms bilaterally. Underlying chronic dermatitis with circular scab-like lesions related to lichen planus also extensive over arms and legs. Additional neurofibromas scattered including the chin area of  the face, subxiphoid area of the chest, arms, and legs. No active or new bruises.    Lab Results: Lab Results  Component Value Date   WBC 10.3 05/18/2013   HGB 13.5 05/18/2013   HCT 37.5 05/18/2013   MCV 93.3 05/18/2013   PLT 270 05/18/2013     Chemistry      Component Value Date/Time   NA 133* 05/18/2013 2359   K 3.9 05/18/2013 2359   CL 98 05/18/2013 2359   CO2 26 05/18/2013 2359   BUN 11 05/18/2013 2359   CREATININE 0.74 05/18/2013 2359      Component Value Date/Time   CALCIUM 8.6 05/18/2013 2359   ALKPHOS 116 05/18/2013 2359   AST 13 05/18/2013 2359   ALT 8 05/18/2013 2359   BILITOT 0.1* 05/18/2013 2359       Review of peripheral blood film:  Normochromic normocytic red cells. Mature neutrophils and lymphocytes. Platelets appear normal in number and morphology.      Impression and Plan: Extensive, spontaneous, but self-limited, ecchymoses which started coincidentally about 3 days after dental extractions. She received conscious sedation and probably nitrous oxide gas. She  was taking large amounts of ibuprofen for one week before and one week after the dental work. She has a normal platelet count. Normal white count differential. Normal baseline pro time and PTT. There is no evidence for a leukemic process. My clinical suspicion is that this might represent an atypical drug reaction/vasculitis, alternatively, She may have an underlying platelet dysfunction exacerbated by the ibuprofen. I would consider von Willebrand's disorder in my differential as well.  I am going to check with her oral surgeon to see what anesthetics were used. I'm going to repeat her CBC today with a peripheral blood film to review and also obtain blood for a von Willebrand's profile. I will see her back when these results are available. She is planning to have more back surgery in July. Her surgical care has been provided at [facility]. If  blood testing today  does not reveal any abnormalities, I will see if she can have platelet function studies done at [facility] prior to having additional surgery.      Annia Belt, MD 12/06/2013, 10:43 AM

## 2013-12-06 NOTE — Patient Instructions (Signed)
To lab today Return visit in 2 weeks 11:15 AM June 29 to review results Avoid aspirin products Minimize use of Ibuprofen Tylenol & hydrocodone OK

## 2013-12-08 ENCOUNTER — Encounter: Payer: Self-pay | Admitting: Oncology

## 2013-12-08 DIAGNOSIS — R233 Spontaneous ecchymoses: Secondary | ICD-10-CM

## 2013-12-08 HISTORY — DX: Spontaneous ecchymoses: R23.3

## 2013-12-08 LAB — VON WILLEBRAND PANEL
COAGULATION FACTOR VIII: 162 % — AB (ref 73–140)
Ristocetin Co-factor, Plasma: 76 % (ref 42–200)
Von Willebrand Antigen, Plasma: 125 % (ref 50–217)

## 2013-12-08 NOTE — Progress Notes (Signed)
Patient ID: Sharon Welch, female   DOB: 1971-06-24, 43 y.o.   MRN: [id number] Anesthetics received at oral surgeon's office: Fentanyl, glycoprolate?, lidocaine, midazolam, propofol, also got decadron.  No nitrous oxide.

## 2013-12-09 ENCOUNTER — Telehealth: Payer: Self-pay | Admitting: *Deleted

## 2013-12-09 NOTE — Telephone Encounter (Signed)
Pt notified per Dr. Beryle Beams that "special blood tests normal so far. Normal Von Willebrand tests. No abnormal cells when he looked at blood under the microscope" - pt states "that's good news". Yvonna Alanis, RN, 12/09/13, 10:47 AM

## 2013-12-17 DIAGNOSIS — M533 Sacrococcygeal disorders, not elsewhere classified: Secondary | ICD-10-CM | POA: Diagnosis not present

## 2013-12-17 DIAGNOSIS — Z4789 Encounter for other orthopedic aftercare: Secondary | ICD-10-CM | POA: Diagnosis not present

## 2013-12-17 DIAGNOSIS — M4120 Other idiopathic scoliosis, site unspecified: Secondary | ICD-10-CM | POA: Diagnosis not present

## 2013-12-17 DIAGNOSIS — Z981 Arthrodesis status: Secondary | ICD-10-CM | POA: Diagnosis not present

## 2013-12-17 DIAGNOSIS — G2581 Restless legs syndrome: Secondary | ICD-10-CM | POA: Diagnosis present

## 2013-12-17 DIAGNOSIS — M4180 Other forms of scoliosis, site unspecified: Secondary | ICD-10-CM | POA: Diagnosis not present

## 2013-12-17 DIAGNOSIS — D62 Acute posthemorrhagic anemia: Secondary | ICD-10-CM | POA: Diagnosis not present

## 2013-12-17 DIAGNOSIS — IMO0002 Reserved for concepts with insufficient information to code with codable children: Secondary | ICD-10-CM | POA: Diagnosis present

## 2013-12-17 DIAGNOSIS — M549 Dorsalgia, unspecified: Secondary | ICD-10-CM | POA: Diagnosis not present

## 2013-12-17 DIAGNOSIS — M539 Dorsopathy, unspecified: Secondary | ICD-10-CM | POA: Diagnosis not present

## 2013-12-17 DIAGNOSIS — G43709 Chronic migraine without aura, not intractable, without status migrainosus: Secondary | ICD-10-CM | POA: Diagnosis present

## 2013-12-20 ENCOUNTER — Encounter: Payer: Medicaid Other | Admitting: Oncology

## 2013-12-20 ENCOUNTER — Encounter: Payer: Self-pay | Admitting: Oncology

## 2013-12-20 NOTE — Progress Notes (Signed)
The patient failed to report for today's visit.  43 year old woman with multiple medical problems detailed in my office consultation note of 12/06/2013. She was referred for rather dramatic, extensive, spontaneous, bruising. This occurred coincidentally a few days following dental extractions. At that time she received conscious sedation with a number of medications including fentanyl, propofol, glycopyrrolate, lidocaine, and midazolam. Also of note, she was taking large amounts of Motrin for at least a week before and the week after the dental procedures. At the time of her visit, all of her bruises were in a healing stage and she had not experienced any new bruises. I screened her for von Willebrand's disorder. All of the values were high normal excluding VW. She had normal baseline pro time and PTT tests. She normal white count differential and no immature cells on review of the peripheral blood film.  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 She should avoid aspirin products and only use nonsteroidals on a limited basis in the future.  She has back surgery planned for July of this year at [facility]. They have an excellent coagulation laboratory and I would recommend platelet function studies in anticipation of the surgery.

## 2013-12-28 ENCOUNTER — Ambulatory Visit: Payer: Self-pay | Admitting: Pain Medicine

## 2014-01-11 DIAGNOSIS — Z981 Arthrodesis status: Secondary | ICD-10-CM | POA: Diagnosis not present

## 2014-01-11 DIAGNOSIS — Z4789 Encounter for other orthopedic aftercare: Secondary | ICD-10-CM | POA: Diagnosis not present

## 2014-01-27 ENCOUNTER — Ambulatory Visit: Payer: Self-pay | Admitting: Pain Medicine

## 2014-01-27 DIAGNOSIS — M5126 Other intervertebral disc displacement, lumbar region: Secondary | ICD-10-CM | POA: Diagnosis not present

## 2014-01-27 DIAGNOSIS — R51 Headache: Secondary | ICD-10-CM | POA: Diagnosis not present

## 2014-01-27 DIAGNOSIS — M5030 Other cervical disc degeneration, unspecified cervical region: Secondary | ICD-10-CM | POA: Diagnosis not present

## 2014-01-27 DIAGNOSIS — Z79899 Other long term (current) drug therapy: Secondary | ICD-10-CM | POA: Diagnosis not present

## 2014-01-27 DIAGNOSIS — M5137 Other intervertebral disc degeneration, lumbosacral region: Secondary | ICD-10-CM | POA: Diagnosis not present

## 2014-02-07 DIAGNOSIS — Z4789 Encounter for other orthopedic aftercare: Secondary | ICD-10-CM | POA: Diagnosis not present

## 2014-02-07 DIAGNOSIS — Z981 Arthrodesis status: Secondary | ICD-10-CM | POA: Diagnosis not present

## 2014-02-16 DIAGNOSIS — R569 Unspecified convulsions: Secondary | ICD-10-CM | POA: Diagnosis not present

## 2014-02-22 DIAGNOSIS — IMO0001 Reserved for inherently not codable concepts without codable children: Secondary | ICD-10-CM | POA: Diagnosis not present

## 2014-02-22 DIAGNOSIS — Z9181 History of falling: Secondary | ICD-10-CM | POA: Diagnosis not present

## 2014-02-22 DIAGNOSIS — R269 Unspecified abnormalities of gait and mobility: Secondary | ICD-10-CM | POA: Diagnosis not present

## 2014-02-22 DIAGNOSIS — M546 Pain in thoracic spine: Secondary | ICD-10-CM | POA: Diagnosis not present

## 2014-02-22 DIAGNOSIS — R262 Difficulty in walking, not elsewhere classified: Secondary | ICD-10-CM | POA: Diagnosis not present

## 2014-02-22 DIAGNOSIS — Z981 Arthrodesis status: Secondary | ICD-10-CM | POA: Diagnosis not present

## 2014-02-24 ENCOUNTER — Ambulatory Visit: Payer: Self-pay | Admitting: Pain Medicine

## 2014-02-24 DIAGNOSIS — R209 Unspecified disturbances of skin sensation: Secondary | ICD-10-CM | POA: Diagnosis not present

## 2014-02-24 DIAGNOSIS — M79609 Pain in unspecified limb: Secondary | ICD-10-CM | POA: Diagnosis not present

## 2014-02-24 DIAGNOSIS — M542 Cervicalgia: Secondary | ICD-10-CM | POA: Diagnosis not present

## 2014-02-24 DIAGNOSIS — M546 Pain in thoracic spine: Secondary | ICD-10-CM | POA: Diagnosis not present

## 2014-02-26 DIAGNOSIS — R569 Unspecified convulsions: Secondary | ICD-10-CM | POA: Diagnosis not present

## 2014-03-08 DIAGNOSIS — R569 Unspecified convulsions: Secondary | ICD-10-CM | POA: Diagnosis not present

## 2014-03-08 DIAGNOSIS — R9401 Abnormal electroencephalogram [EEG]: Secondary | ICD-10-CM | POA: Diagnosis not present

## 2014-03-10 DIAGNOSIS — Z9181 History of falling: Secondary | ICD-10-CM | POA: Diagnosis not present

## 2014-03-10 DIAGNOSIS — Z981 Arthrodesis status: Secondary | ICD-10-CM | POA: Diagnosis not present

## 2014-03-10 DIAGNOSIS — R262 Difficulty in walking, not elsewhere classified: Secondary | ICD-10-CM | POA: Diagnosis not present

## 2014-03-10 DIAGNOSIS — IMO0001 Reserved for inherently not codable concepts without codable children: Secondary | ICD-10-CM | POA: Diagnosis not present

## 2014-03-10 DIAGNOSIS — M546 Pain in thoracic spine: Secondary | ICD-10-CM | POA: Diagnosis not present

## 2014-03-10 DIAGNOSIS — R269 Unspecified abnormalities of gait and mobility: Secondary | ICD-10-CM | POA: Diagnosis not present

## 2014-03-14 DIAGNOSIS — M546 Pain in thoracic spine: Secondary | ICD-10-CM | POA: Diagnosis not present

## 2014-03-14 DIAGNOSIS — Z981 Arthrodesis status: Secondary | ICD-10-CM | POA: Diagnosis not present

## 2014-03-14 DIAGNOSIS — R262 Difficulty in walking, not elsewhere classified: Secondary | ICD-10-CM | POA: Diagnosis not present

## 2014-03-14 DIAGNOSIS — Z9181 History of falling: Secondary | ICD-10-CM | POA: Diagnosis not present

## 2014-03-14 DIAGNOSIS — IMO0001 Reserved for inherently not codable concepts without codable children: Secondary | ICD-10-CM | POA: Diagnosis not present

## 2014-03-14 DIAGNOSIS — R269 Unspecified abnormalities of gait and mobility: Secondary | ICD-10-CM | POA: Diagnosis not present

## 2014-03-16 DIAGNOSIS — J209 Acute bronchitis, unspecified: Secondary | ICD-10-CM | POA: Diagnosis not present

## 2014-03-16 DIAGNOSIS — R059 Cough, unspecified: Secondary | ICD-10-CM | POA: Diagnosis not present

## 2014-03-21 DIAGNOSIS — R262 Difficulty in walking, not elsewhere classified: Secondary | ICD-10-CM | POA: Diagnosis not present

## 2014-03-21 DIAGNOSIS — M546 Pain in thoracic spine: Secondary | ICD-10-CM | POA: Diagnosis not present

## 2014-03-21 DIAGNOSIS — IMO0001 Reserved for inherently not codable concepts without codable children: Secondary | ICD-10-CM | POA: Diagnosis not present

## 2014-03-21 DIAGNOSIS — R269 Unspecified abnormalities of gait and mobility: Secondary | ICD-10-CM | POA: Diagnosis not present

## 2014-03-21 DIAGNOSIS — Z981 Arthrodesis status: Secondary | ICD-10-CM | POA: Diagnosis not present

## 2014-03-21 DIAGNOSIS — Z9181 History of falling: Secondary | ICD-10-CM | POA: Diagnosis not present

## 2014-03-28 DIAGNOSIS — R262 Difficulty in walking, not elsewhere classified: Secondary | ICD-10-CM | POA: Diagnosis not present

## 2014-03-28 DIAGNOSIS — Z5189 Encounter for other specified aftercare: Secondary | ICD-10-CM | POA: Diagnosis not present

## 2014-03-28 DIAGNOSIS — Z981 Arthrodesis status: Secondary | ICD-10-CM | POA: Diagnosis not present

## 2014-03-28 DIAGNOSIS — Z9181 History of falling: Secondary | ICD-10-CM | POA: Diagnosis not present

## 2014-03-28 DIAGNOSIS — R2681 Unsteadiness on feet: Secondary | ICD-10-CM | POA: Diagnosis not present

## 2014-03-28 DIAGNOSIS — M546 Pain in thoracic spine: Secondary | ICD-10-CM | POA: Diagnosis not present

## 2014-04-11 DIAGNOSIS — Z981 Arthrodesis status: Secondary | ICD-10-CM | POA: Diagnosis not present

## 2014-04-11 DIAGNOSIS — Z4889 Encounter for other specified surgical aftercare: Secondary | ICD-10-CM | POA: Diagnosis not present

## 2014-04-27 DIAGNOSIS — R42 Dizziness and giddiness: Secondary | ICD-10-CM | POA: Diagnosis not present

## 2014-05-02 DIAGNOSIS — R42 Dizziness and giddiness: Secondary | ICD-10-CM | POA: Diagnosis not present

## 2014-05-02 DIAGNOSIS — F17200 Nicotine dependence, unspecified, uncomplicated: Secondary | ICD-10-CM | POA: Diagnosis not present

## 2014-05-02 DIAGNOSIS — F411 Generalized anxiety disorder: Secondary | ICD-10-CM | POA: Diagnosis not present

## 2014-05-03 ENCOUNTER — Ambulatory Visit: Payer: Self-pay | Admitting: Pain Medicine

## 2014-05-03 DIAGNOSIS — M5416 Radiculopathy, lumbar region: Secondary | ICD-10-CM | POA: Diagnosis not present

## 2014-05-03 DIAGNOSIS — M542 Cervicalgia: Secondary | ICD-10-CM | POA: Diagnosis not present

## 2014-05-03 DIAGNOSIS — M546 Pain in thoracic spine: Secondary | ICD-10-CM | POA: Diagnosis not present

## 2014-05-03 DIAGNOSIS — M47817 Spondylosis without myelopathy or radiculopathy, lumbosacral region: Secondary | ICD-10-CM | POA: Diagnosis not present

## 2014-05-03 DIAGNOSIS — R51 Headache: Secondary | ICD-10-CM | POA: Diagnosis not present

## 2014-05-03 DIAGNOSIS — M5137 Other intervertebral disc degeneration, lumbosacral region: Secondary | ICD-10-CM | POA: Diagnosis not present

## 2014-05-03 DIAGNOSIS — M545 Low back pain: Secondary | ICD-10-CM | POA: Diagnosis not present

## 2014-05-03 DIAGNOSIS — M5134 Other intervertebral disc degeneration, thoracic region: Secondary | ICD-10-CM | POA: Diagnosis not present

## 2014-05-09 DIAGNOSIS — R569 Unspecified convulsions: Secondary | ICD-10-CM | POA: Diagnosis not present

## 2014-05-10 DIAGNOSIS — Z5189 Encounter for other specified aftercare: Secondary | ICD-10-CM | POA: Diagnosis not present

## 2014-05-10 DIAGNOSIS — M546 Pain in thoracic spine: Secondary | ICD-10-CM | POA: Diagnosis not present

## 2014-05-10 DIAGNOSIS — R2681 Unsteadiness on feet: Secondary | ICD-10-CM | POA: Diagnosis not present

## 2014-05-10 DIAGNOSIS — M545 Low back pain: Secondary | ICD-10-CM | POA: Diagnosis not present

## 2014-05-13 DIAGNOSIS — R2681 Unsteadiness on feet: Secondary | ICD-10-CM | POA: Diagnosis not present

## 2014-05-13 DIAGNOSIS — Z5189 Encounter for other specified aftercare: Secondary | ICD-10-CM | POA: Diagnosis not present

## 2014-05-13 DIAGNOSIS — M546 Pain in thoracic spine: Secondary | ICD-10-CM | POA: Diagnosis not present

## 2014-05-13 DIAGNOSIS — M545 Low back pain: Secondary | ICD-10-CM | POA: Diagnosis not present

## 2014-06-02 ENCOUNTER — Ambulatory Visit: Payer: Self-pay | Admitting: Pain Medicine

## 2014-06-02 DIAGNOSIS — R51 Headache: Secondary | ICD-10-CM | POA: Diagnosis not present

## 2014-06-02 DIAGNOSIS — M47894 Other spondylosis, thoracic region: Secondary | ICD-10-CM | POA: Diagnosis not present

## 2014-06-02 DIAGNOSIS — M47896 Other spondylosis, lumbar region: Secondary | ICD-10-CM | POA: Diagnosis not present

## 2014-06-02 DIAGNOSIS — M47817 Spondylosis without myelopathy or radiculopathy, lumbosacral region: Secondary | ICD-10-CM | POA: Diagnosis not present

## 2014-06-02 DIAGNOSIS — M5416 Radiculopathy, lumbar region: Secondary | ICD-10-CM | POA: Diagnosis not present

## 2014-06-02 DIAGNOSIS — M47892 Other spondylosis, cervical region: Secondary | ICD-10-CM | POA: Diagnosis not present

## 2014-06-02 DIAGNOSIS — M542 Cervicalgia: Secondary | ICD-10-CM | POA: Diagnosis not present

## 2014-06-02 DIAGNOSIS — M419 Scoliosis, unspecified: Secondary | ICD-10-CM | POA: Diagnosis not present

## 2014-06-03 ENCOUNTER — Emergency Department (HOSPITAL_COMMUNITY): Payer: Medicare Other

## 2014-06-03 ENCOUNTER — Encounter (HOSPITAL_COMMUNITY): Payer: Self-pay | Admitting: Nurse Practitioner

## 2014-06-03 ENCOUNTER — Emergency Department (HOSPITAL_COMMUNITY)
Admission: EM | Admit: 2014-06-03 | Discharge: 2014-06-03 | Disposition: A | Discharge status: Home / Self Care | Payer: Medicare Other | Attending: Emergency Medicine | Admitting: Emergency Medicine

## 2014-06-03 DIAGNOSIS — R51 Headache: Secondary | ICD-10-CM | POA: Diagnosis present

## 2014-06-03 DIAGNOSIS — Z8742 Personal history of other diseases of the female genital tract: Secondary | ICD-10-CM | POA: Diagnosis not present

## 2014-06-03 DIAGNOSIS — Z792 Long term (current) use of antibiotics: Secondary | ICD-10-CM | POA: Insufficient documentation

## 2014-06-03 DIAGNOSIS — Z79899 Other long term (current) drug therapy: Secondary | ICD-10-CM | POA: Insufficient documentation

## 2014-06-03 DIAGNOSIS — G459 Transient cerebral ischemic attack, unspecified: Secondary | ICD-10-CM | POA: Insufficient documentation

## 2014-06-03 DIAGNOSIS — Z87891 Personal history of nicotine dependence: Secondary | ICD-10-CM | POA: Insufficient documentation

## 2014-06-03 DIAGNOSIS — R5383 Other fatigue: Secondary | ICD-10-CM | POA: Diagnosis not present

## 2014-06-03 DIAGNOSIS — Z8739 Personal history of other diseases of the musculoskeletal system and connective tissue: Secondary | ICD-10-CM | POA: Diagnosis not present

## 2014-06-03 DIAGNOSIS — G451 Carotid artery syndrome (hemispheric): Secondary | ICD-10-CM

## 2014-06-03 DIAGNOSIS — G8929 Other chronic pain: Secondary | ICD-10-CM | POA: Insufficient documentation

## 2014-06-03 LAB — RAPID URINE DRUG SCREEN, HOSP PERFORMED
Amphetamines: NOT DETECTED
Barbiturates: NOT DETECTED
Benzodiazepines: POSITIVE — AB
COCAINE: NOT DETECTED
Opiates: NOT DETECTED
Tetrahydrocannabinol: NOT DETECTED

## 2014-06-03 LAB — I-STAT CHEM 8, ED
BUN: 18 mg/dL (ref 6–23)
Calcium, Ion: 1.15 mmol/L (ref 1.12–1.23)
Chloride: 104 mEq/L (ref 96–112)
Creatinine, Ser: 0.6 mg/dL (ref 0.50–1.10)
Glucose, Bld: 79 mg/dL (ref 70–99)
HCT: 44 % (ref 36.0–46.0)
HEMOGLOBIN: 15 g/dL (ref 12.0–15.0)
Potassium: 3.3 mEq/L — ABNORMAL LOW (ref 3.7–5.3)
SODIUM: 140 meq/L (ref 137–147)
TCO2: 23 mmol/L (ref 0–100)

## 2014-06-03 LAB — PROTIME-INR
INR: 1.08 (ref 0.00–1.49)
PROTHROMBIN TIME: 14.1 s (ref 11.6–15.2)

## 2014-06-03 LAB — COMPREHENSIVE METABOLIC PANEL
ALBUMIN: 4 g/dL (ref 3.5–5.2)
ALK PHOS: 122 U/L — AB (ref 39–117)
ALT: 15 U/L (ref 0–35)
AST: 53 U/L — AB (ref 0–37)
Anion gap: 16 — ABNORMAL HIGH (ref 5–15)
BUN: 16 mg/dL (ref 6–23)
CHLORIDE: 102 meq/L (ref 96–112)
CO2: 21 mEq/L (ref 19–32)
Calcium: 8.9 mg/dL (ref 8.4–10.5)
Creatinine, Ser: 0.65 mg/dL (ref 0.50–1.10)
GFR calc Af Amer: >90 mL/min (ref >90)
GFR calc non Af Amer: >90 mL/min (ref >90)
Glucose, Bld: 78 mg/dL (ref 70–99)
POTASSIUM: 3.6 meq/L — AB (ref 3.7–5.3)
Sodium: 139 mEq/L (ref 137–147)
Total Protein: 7.5 g/dL (ref 6.0–8.3)

## 2014-06-03 LAB — URINALYSIS, ROUTINE W REFLEX MICROSCOPIC
BILIRUBIN URINE: NEGATIVE
Glucose, UA: NEGATIVE mg/dL
HGB URINE DIPSTICK: NEGATIVE
Ketones, ur: NEGATIVE mg/dL
Leukocytes, UA: NEGATIVE
Nitrite: NEGATIVE
PH: 5.5 (ref 5.0–8.0)
Protein, ur: NEGATIVE mg/dL
SPECIFIC GRAVITY, URINE: 1.026 (ref 1.005–1.030)
Urobilinogen, UA: 0.2 mg/dL (ref 0.0–1.0)

## 2014-06-03 LAB — URINE MICROSCOPIC-ADD ON

## 2014-06-03 LAB — DIFFERENTIAL
BASOS ABS: 0.1 10*3/uL (ref 0.0–0.1)
Basophils Relative: 1 % (ref 0–1)
Eosinophils Absolute: 0.1 10*3/uL (ref 0.0–0.7)
Eosinophils Relative: 1 % (ref 0–5)
Lymphocytes Relative: 24 % (ref 12–46)
Lymphs Abs: 1.9 10*3/uL (ref 0.7–4.0)
Monocytes Absolute: 0.8 10*3/uL (ref 0.1–1.0)
Monocytes Relative: 10 % (ref 3–12)
NEUTROS ABS: 5.2 10*3/uL (ref 1.7–7.7)
Neutrophils Relative %: 64 % (ref 43–77)

## 2014-06-03 LAB — CBC
HCT: 40.6 % (ref 36.0–46.0)
HEMOGLOBIN: 14.1 g/dL (ref 12.0–15.0)
MCH: 30.7 pg (ref 26.0–34.0)
MCHC: 34.7 g/dL (ref 30.0–36.0)
MCV: 88.5 fL (ref 78.0–100.0)
PLATELETS: 228 10*3/uL (ref 150–400)
RBC: 4.59 MIL/uL (ref 3.87–5.11)
RDW: 13.9 % (ref 11.5–15.5)
WBC: 8.1 10*3/uL (ref 4.0–10.5)

## 2014-06-03 LAB — I-STAT TROPONIN, ED: TROPONIN I, POC: 0 ng/mL (ref 0.00–0.08)

## 2014-06-03 LAB — APTT: aPTT: 36 seconds (ref 24–37)

## 2014-06-03 LAB — ETHANOL: Alcohol, Ethyl (B): <11 mg/dL (ref 0–11)

## 2014-06-03 MED ORDER — OXYCODONE-ACETAMINOPHEN 5-325 MG PO TABS
2.0000 | ORAL_TABLET | Freq: Once | ORAL | Status: AC
  Administered 2014-06-03: 2 via ORAL
  Filled 2014-06-03: qty 2

## 2014-06-03 MED ORDER — SODIUM CHLORIDE 0.9 % IV BOLUS (SEPSIS)
1000.0000 mL | Freq: Once | INTRAVENOUS | Status: AC
Start: 2014-06-03 — End: 2014-06-03
  Administered 2014-06-03: 1000 mL via INTRAVENOUS

## 2014-06-03 MED ORDER — DIPHENHYDRAMINE HCL 50 MG/ML IJ SOLN
25.0000 mg | Freq: Once | INTRAMUSCULAR | Status: AC
  Administered 2014-06-03: 25 mg via INTRAVENOUS
  Filled 2014-06-03: qty 1

## 2014-06-03 MED ORDER — METOCLOPRAMIDE HCL 5 MG/ML IJ SOLN
10.0000 mg | Freq: Once | INTRAMUSCULAR | Status: AC
  Administered 2014-06-03: 10 mg via INTRAVENOUS
  Filled 2014-06-03: qty 2

## 2014-06-03 NOTE — ED Provider Notes (Signed)
CSN: [id number]     Arrival date & time 06/03/14  1244 History   First MD Initiated Contact with Patient 06/03/14 1251     Chief Complaint  Patient presents with  . Headache     (Consider location/radiation/quality/duration/timing/severity/associated sxs/prior Treatment) HPI Comments: Patient with past medical history of headaches, seizures, chronic back pain, and reported TIA presents emergency department with chief complaint of headaches that started this morning. She states that when she woke she is complaining of a headache. She also states that she felt like she was drunk, and that she had slurred speech. She also reports that she had right lower extremity weakness and numbness. She states that the symptoms have since improved. She states that she has still had some intermittent slurred speech. Currently her speech is clear. Patient is concerned that she might of had a "mini stroke." She states that her brother recently had a stroke as well. Patient has known history of seizure disorder, for which she was taking Keppra, but states that this medication was discontinued. She is uncertain why. Patient has has tried taking meclizine prior to arrival with no improvement of symptoms.  The history is provided by the patient. No language interpreter was used.    Past Medical History  Diagnosis Date  . Scoliosis   . Chronic back pain   . Seizures     started 9/12-  . Headache(784.0)   . Chronic back pain   . Partial tear subscapularis tendon 12/27/2011  . Ovarian cyst   . Bruising, spontaneous 12/08/2013   Past Surgical History  Procedure Laterality Date  . Back surgery  1989    scoliosis throsic-rods  . Tubal ligation    . Cesarean section    . Abdominal hysterectomy  2012  . Left arm    . Eus N/A 11/03/2012    Procedure: UPPER ENDOSCOPIC ULTRASOUND (EUS) LINEAR;  Surgeon: Beryle Beams, MD;  Location: [facility];  Service: Endoscopy;  Laterality: N/A;  . Cholecystectomy N/A  11/17/2012    Procedure: LAPAROSCOPIC CHOLECYSTECTOMY WITH INTRAOPERATIVE CHOLANGIOGRAM;  Surgeon: Ralene Ok, MD;  Location: [facility];  Service: General;  Laterality: N/A;  . Neck surgery    . Ercp N/A 05/07/2013    Procedure: ENDOSCOPIC RETROGRADE CHOLANGIOPANCREATOGRAPHY (ERCP);  Surgeon: Beryle Beams, MD;  Location: [facility];  Service: Endoscopy;  Laterality: N/A;  start ercp first, if canulation fails switch to EUS    Family History  Problem Relation Age of Onset  . Hypertension Mother   . Hypertension Father   . Cancer Father     prostate  . Heart disease Father   . Hypertension Brother   . Hypertension Brother    History  Substance Use Topics  . Smoking status: Former Smoker -- 0.75 packs/day for 20 years    Types: Cigarettes    Quit date: 08/22/2013  . Smokeless tobacco: Never Used  . Alcohol Use: Yes     Comment: 1-2 sips a years   OB History    No data available     Review of Systems  Constitutional: Negative for fever and chills.  Respiratory: Negative for shortness of breath.   Cardiovascular: Negative for chest pain.  Gastrointestinal: Negative for nausea, vomiting, diarrhea and constipation.  Genitourinary: Negative for dysuria.  Neurological: Positive for speech difficulty, weakness, numbness and headaches.  All other systems reviewed and are negative.     Allergies  Tramadol  Home Medications   Prior to Admission medications   Medication Sig  Start Date End Date Taking? Authorizing Provider  acetaminophen (TYLENOL) 500 MG tablet Take 1,000 mg by mouth every 6 (six) hours as needed for moderate pain.     Historical Provider, MD  amitriptyline (ELAVIL) 10 MG tablet Take 30 mg by mouth at bedtime.     Historical Provider, MD  gabapentin (NEURONTIN) 300 MG capsule Take 900-1,200 mg by mouth 2 (two) times daily. Take 3 capsule in the morning and 4 capsule at night    Historical Provider, MD  HYDROcodone-acetaminophen (NORCO) 10-325 MG per tablet  Take 1 tablet by mouth every 4 (four) hours as needed for severe pain.    Historical Provider, MD  HYDROmorphone (DILAUDID) 4 MG tablet Take 1 tablet (4 mg total) by mouth every 4 (four) hours as needed for moderate pain or severe pain (refractory pain). 05/10/13   Theodis Blaze, MD  magnesium oxide (MAG-OX) 400 MG tablet Take 400 mg by mouth at bedtime.    Historical Provider, MD  Multiple Vitamin (MULTIVITAMIN WITH MINERALS) TABS tablet Take 1 tablet by mouth daily.    Historical Provider, MD  neomycin-polymyxin-pramoxine ([employer] ANTIBIOTIC PLUS) 1 % cream Apply topically 2 (two) times daily. 05/10/13   Theodis Blaze, MD  omeprazole (PRILOSEC) 20 MG capsule Take 1 capsule (20 mg total) by mouth daily. 05/19/13   Artis Delay, MD  ondansetron (ZOFRAN) 4 MG tablet Take 1 tablet (4 mg total) by mouth every 6 (six) hours as needed for nausea. 05/10/13   Theodis Blaze, MD  rOPINIRole (REQUIP) 0.25 MG tablet Take 0.25 mg by mouth at bedtime.    Historical Provider, MD  sertraline (ZOLOFT) 100 MG tablet Take 100 mg by mouth every morning.     Historical Provider, MD  tiZANidine (ZANAFLEX) 2 MG tablet Take 2 mg by mouth every 6 (six) hours as needed (for muscle spasms).    Historical Provider, MD  zolpidem (AMBIEN) 10 MG tablet Take 0.5 tablets (5 mg total) by mouth at bedtime as needed for sleep. 05/10/13   Theodis Blaze, MD   BP 125/70 mmHg  Pulse 101  Temp(Src) 98.3 F (36.8 C) (Oral)  Resp 18  SpO2 100% Physical Exam  Constitutional: She is oriented to person, place, and time. She appears well-developed and well-nourished.  HENT:  Head: Normocephalic and atraumatic.  Right Ear: External ear normal.  Left Ear: External ear normal.  Eyes: Conjunctivae and EOM are normal. Pupils are equal, round, and reactive to light.  Neck: Normal range of motion. Neck supple.  No pain with neck flexion, no meningismus  Cardiovascular: Normal rate, regular rhythm and normal heart sounds.  Exam reveals no gallop  and no friction rub.   No murmur heard. Pulmonary/Chest: Effort normal and breath sounds normal. No respiratory distress. She has no wheezes. She has no rales. She exhibits no tenderness.  Abdominal: Soft. She exhibits no distension and no mass. There is no tenderness. There is no rebound and no guarding.  Musculoskeletal: Normal range of motion. She exhibits no edema or tenderness.  Normal gait.  Neurological: She is alert and oriented to person, place, and time. She has normal reflexes.  CN 3-12 intact, normal finger to nose, no pronator drift, sensation and strength intact bilaterally.  Skin: Skin is warm and dry.  Psychiatric: She has a normal mood and affect. Her behavior is normal. Judgment and thought content normal.  Nursing note and vitals reviewed.   ED Course  Procedures (including critical care time) Results for orders placed  or performed during the hospital encounter of 06/03/14  Protime-INR  Result Value Ref Range   Prothrombin Time 14.1 11.6 - 15.2 seconds   INR 1.08 0.00 - 1.49  APTT  Result Value Ref Range   aPTT 36 24 - 37 seconds  CBC  Result Value Ref Range   WBC 8.1 4.0 - 10.5 K/uL   RBC 4.59 3.87 - 5.11 MIL/uL   Hemoglobin 14.1 12.0 - 15.0 g/dL   HCT 40.6 36.0 - 46.0 %   MCV 88.5 78.0 - 100.0 fL   MCH 30.7 26.0 - 34.0 pg   MCHC 34.7 30.0 - 36.0 g/dL   RDW 13.9 11.5 - 15.5 %   Platelets 228 150 - 400 K/uL  Differential  Result Value Ref Range   Neutrophils Relative % 64 43 - 77 %   Neutro Abs 5.2 1.7 - 7.7 K/uL   Lymphocytes Relative 24 12 - 46 %   Lymphs Abs 1.9 0.7 - 4.0 K/uL   Monocytes Relative 10 3 - 12 %   Monocytes Absolute 0.8 0.1 - 1.0 K/uL   Eosinophils Relative 1 0 - 5 %   Eosinophils Absolute 0.1 0.0 - 0.7 K/uL   Basophils Relative 1 0 - 1 %   Basophils Absolute 0.1 0.0 - 0.1 K/uL  Comprehensive metabolic panel  Result Value Ref Range   Sodium 139 137 - 147 mEq/L   Potassium 3.6 (L) 3.7 - 5.3 mEq/L   Chloride 102 96 - 112 mEq/L   CO2  21 19 - 32 mEq/L   Glucose, Bld 78 70 - 99 mg/dL   BUN 16 6 - 23 mg/dL   Creatinine, Ser 0.65 0.50 - 1.10 mg/dL   Calcium 8.9 8.4 - 10.5 mg/dL   Total Protein 7.5 6.0 - 8.3 g/dL   Albumin 4.0 3.5 - 5.2 g/dL   AST 53 (H) 0 - 37 U/L   ALT 15 0 - 35 U/L   Alkaline Phosphatase 122 (H) 39 - 117 U/L   Total Bilirubin <0.2 (L) 0.3 - 1.2 mg/dL   GFR calc non Af Amer >90 >90 mL/min   GFR calc Af Amer >90 >90 mL/min   Anion gap 16 (H) 5 - 15  Ethanol  Result Value Ref Range   Alcohol, Ethyl (B) <11 0 - 11 mg/dL  I-stat troponin, ED (not at [facility])  Result Value Ref Range   Troponin i, poc 0.00 0.00 - 0.08 ng/mL   Comment 3          I-Stat Chem 8, ED  Result Value Ref Range   Sodium 140 137 - 147 mEq/L   Potassium 3.3 (L) 3.7 - 5.3 mEq/L   Chloride 104 96 - 112 mEq/L   BUN 18 6 - 23 mg/dL   Creatinine, Ser 0.60 0.50 - 1.10 mg/dL   Glucose, Bld 79 70 - 99 mg/dL   Calcium, Ion 1.15 1.12 - 1.23 mmol/L   TCO2 23 0 - 100 mmol/L   Hemoglobin 15.0 12.0 - 15.0 g/dL   HCT 44.0 36.0 - 46.0 %   Ct Head Wo Contrast  06/03/2014   CLINICAL DATA:  Acute onset of lethargy earlier today, associated with left lower extremity numbness. Prior seizure history.  EXAM: CT HEAD WITHOUT CONTRAST  TECHNIQUE: Contiguous axial images were obtained from the base of the skull through the vertex without intravenous contrast.  COMPARISON:  MRI brain 04/19/2011.  CT head 03/26/2011.  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 Ventricular system normal in size and appearance for age. No mass lesion. No midline shift. No acute hemorrhage or hematoma. No extra-axial fluid collections. No evidence of acute infarction. No interval change since the prior CT.  No skull fracture or other focal osseous abnormality involving the skull. Visualized paranasal sinuses, bilateral mastoid air cells and bilateral middle ear cavities well-aerated.  IMPRESSION: 1. No acute  intracranial abnormality. No interval change since 03/2011. 2. 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   Electronically Signed   By: Evangeline Dakin M.D.   On: 06/03/2014 14:50      EKG Interpretation None      MDM   Final diagnoses:  None    Patient with history of seizures, currently not taking her medication. Also with reported history of TIA. Patient complains of right-sided lower extremity weakness this morning when she woke with associated headache and slurred speech. She states the symptoms are intermittent. She also complains of chronic low back pain, which could be contributing to numbness and weakness in her legs. We'll check basic labs, head CT, and will reassess.  Patient seen by and discussed with Dr. Christy Gentles, who recommends admission for TIA.   Discussed the patient with Dr. Aileen Fass, who recommend neurology consult and MRI.  Call back if neurology recommends admission.  3:44 PM Discussed the patient with Dr. Armida Sans who will see the patient.  Discussed with neurology PA, who says plan for MRI.  If negative discharge.  Patient signed out the Hess, PA-C.  Montine Circle, PA-C 06/03/14 [place], MD 06/03/14 Lurline Hare

## 2014-06-03 NOTE — Discharge Instructions (Signed)
Transient Ischemic Attack  A transient ischemic attack (TIA) is a "warning stroke" that causes stroke-like symptoms. Unlike a stroke, a TIA does not cause permanent damage to the brain. The symptoms of a TIA can happen very fast and do not last long. It is important to know the symptoms of a TIA and what to do. This can help prevent a major stroke or death.  CAUSES   · A TIA is caused by a temporary blockage in an artery in the brain or neck (carotid artery). The blockage does not allow the brain to get the blood supply it needs and can cause different symptoms. The blockage can be caused by either:  ¨ A blood clot.  ¨ Fatty buildup (plaque) in a neck or brain artery.  RISK FACTORS  · High blood pressure (hypertension).  · High cholesterol.  · Diabetes mellitus.  · Heart disease.  · The build up of plaque in the blood vessels (peripheral artery disease or atherosclerosis).  · The build up of plaque in the blood vessels providing blood and oxygen to the brain (carotid artery stenosis).  · An abnormal heart rhythm (atrial fibrillation).  · Obesity.  · Smoking.  · Taking oral contraceptives (especially in combination with smoking).  · Physical inactivity.  · A diet high in fats, salt (sodium), and calories.  · Alcohol use.  · Use of illegal drugs (especially cocaine and methamphetamine).  · Being female.  · Being African American.  · Being over the age of 55.  · Family history of stroke.  · Previous history of blood clots, stroke, TIA, or heart attack.  · Sickle cell disease.  SYMPTOMS   TIA symptoms are the same as a stroke but are temporary. These symptoms usually develop suddenly, or may be newly present upon awakening from sleep:  · Sudden weakness or numbness of the face, arm, or leg, especially on one side of the body.  · Sudden trouble walking or difficulty moving arms or legs.  · Sudden confusion.  · Sudden personality changes.  · Trouble speaking (aphasia) or understanding.  · Difficulty swallowing.  · Sudden  trouble seeing in one or both eyes.  · Double vision.  · Dizziness.  · Loss of balance or coordination.  · Sudden severe headache with no known cause.  · Trouble reading or writing.  · Loss of bowel or bladder control.  · Loss of consciousness.  DIAGNOSIS   Your caregiver may be able to determine the presence or absence of a TIA based on your symptoms, history, and physical exam. Computed tomography (CT scan) of the brain is usually performed to help identify a TIA. Other tests may be done to diagnose a TIA. These tests may include:  · Electrocardiography.  · Continuous heart monitoring.  · Echocardiography.  · Carotid ultrasonography.  · Magnetic resonance imaging (MRI).  · A scan of the brain circulation.  · Blood tests.  PREVENTION   The risk of a TIA can be decreased by appropriately treating high blood pressure, high cholesterol, diabetes, heart disease, and obesity and by quitting smoking, limiting alcohol, and staying physically active.  TREATMENT   Time is of the essence. Since the symptoms of TIA are the same as a stroke, it is important to seek treatment as soon as possible because you may need a medicine to dissolve the clot (thrombolytic) that cannot be given if too much time has passed. Treatment options vary. Treatment options may include rest, oxygen, intravenous (IV) fluids,   and medicines to thin the blood (anticoagulants). Medicines and diet may be used to address diabetes, high blood pressure, and other risk factors. Measures will be taken to prevent short-term and long-term complications, including infection from breathing foreign material into the lungs (aspiration pneumonia), blood clots in the legs, and falls. Treatment options include procedures to either remove plaque in the carotid arteries or dilate carotid arteries that have narrowed due to plaque. Those procedures are:  · Carotid endarterectomy.  · Carotid angioplasty and stenting.  HOME CARE INSTRUCTIONS   · Take all medicines prescribed  by your caregiver. Follow the directions carefully. Medicines may be used to control risk factors for a stroke. Be sure you understand all your medicine instructions.  · You may be told to take aspirin or the anticoagulant warfarin. Warfarin needs to be taken exactly as instructed.  ¨ Taking too much or too little warfarin is dangerous. Too much warfarin increases the risk of bleeding. Too little warfarin continues to allow the risk for blood clots. While taking warfarin, you will need to have regular blood tests to measure your blood clotting time. A PT blood test measures how long it takes for blood to clot. Your PT is used to calculate another value called an INR. Your PT and INR help your caregiver to adjust your dose of warfarin. The dose can change for many reasons. It is critically important that you take warfarin exactly as prescribed.  ¨ Many foods, especially foods high in vitamin K can interfere with warfarin and affect the PT and INR. Foods high in vitamin K include spinach, kale, broccoli, cabbage, collard and turnip greens, brussels sprouts, peas, cauliflower, seaweed, and parsley as well as beef and pork liver, green tea, and soybean oil. You should eat a consistent amount of foods high in vitamin K. Avoid major changes in your diet, or notify your caregiver before changing your diet. Arrange a visit with a dietitian to answer your questions.  ¨ Many medicines can interfere with warfarin and affect the PT and INR. You must tell your caregiver about any and all medicines you take, this includes all vitamins and supplements. Be especially cautious with aspirin and anti-inflammatory medicines. Do not take or discontinue any prescribed or over-the-counter medicine except on the advice of your caregiver or pharmacist.  ¨ Warfarin can have side effects, such as excessive bruising or bleeding. You will need to hold pressure over cuts for longer than usual. Your caregiver or pharmacist will discuss other  potential side effects.  ¨ Avoid sports or activities that may cause injury or bleeding.  ¨ Be mindful when shaving, flossing your teeth, or handling sharp objects.  ¨ Alcohol can change the body's ability to handle warfarin. It is best to avoid alcoholic drinks or consume only very small amounts while taking warfarin. Notify your caregiver if you change your alcohol intake.  ¨ Notify your dentist or other caregivers before procedures.  · Eat a diet that includes 5 or more servings of fruits and vegetables each day. This may reduce the risk of stroke. Certain diets may be prescribed to address high blood pressure, high cholesterol, diabetes, or obesity.  ¨ A low-sodium, low-saturated fat, low-trans fat, low-cholesterol diet is recommended to manage high blood pressure.  ¨ A low-saturated fat, low-trans fat, low-cholesterol, and high-fiber diet may control cholesterol levels.  ¨ A controlled-carbohydrate, controlled-sugar diet is recommended to manage diabetes.  ¨ A reduced-calorie, low-sodium, low-saturated fat, low-trans fat, low-cholesterol diet is recommended to manage obesity.  ·   Maintain a healthy weight.  · Stay physically active. It is recommended that you get at least 30 minutes of activity on most or all days.  · Do not smoke.  · Limit alcohol use even if you are not taking warfarin. Moderate alcohol use is considered to be:  ¨ No more than 2 drinks each day for men.  ¨ No more than 1 drink each day for nonpregnant women.  · Stop drug abuse.  · Home safety. A safe home environment is important to reduce the risk of falls. Your caregiver may arrange for specialists to evaluate your home. Having grab bars in the bedroom and bathroom is often important. Your caregiver may arrange for equipment to be used at home, such as raised toilets and a seat for the shower.  · Follow all instructions for follow-up with your caregiver. This is very important. This includes any referrals and lab tests. Proper follow up can  prevent a stroke or another TIA from occurring.  SEEK MEDICAL CARE IF:  · You have personality changes.  · You have difficulty swallowing.  · You are seeing double.  · You have dizziness.  · You have a fever.  · You have skin breakdown.  SEEK IMMEDIATE MEDICAL CARE IF:   Any of these symptoms may represent a serious problem that is an emergency. Do not wait to see if the symptoms will go away. Get medical help right away. Call your local emergency services (911 in [place] Do not drive yourself to the hospital.  · You have sudden weakness or numbness of the face, arm, or leg, especially on one side of the body.  · You have sudden trouble walking or difficulty moving arms or legs.  · You have sudden confusion.  · You have trouble speaking (aphasia) or understanding.  · You have sudden trouble seeing in one or both eyes.  · You have a loss of balance or coordination.  · You have a sudden, severe headache with no known cause.  · You have new chest pain or an irregular heartbeat.  · You have a partial or total loss of consciousness.  MAKE SURE YOU:   · Understand these instructions.  · Will watch your condition.  · Will get help right away if you are not doing well or get worse.  Document Released: 03/20/2005 Document Revised: 06/15/2013 Document Reviewed: 09/15/2013  [employer]® Patient Information ©2015 [employer]. This information is not intended to replace advice given to you by your health care provider. Make sure you discuss any questions you have with your health care provider.

## 2014-06-03 NOTE — Consult Note (Signed)
Referring Physician: Christy Gentles    Chief Complaint: TIA  HPI:                                                                                                                                         Sharon Welch is an 43 y.o. female who is followed closely by [facility] neurology for her seizures.  Significant other at bedside states she has had multiple EEGS in past including EMU all of which have not shown seizure and she has never had a episode while linked to EEG.  He also notes she has multiple spells where she will become very lethargic and at times not get out of bed for days--these episodes may or may not be associated with HA. Today she woke up and noted her right "leg was numb, significant other rubbed her leg until it was normal but when she stood up she was swaying to both sides and at one point could not hold her self up right".  They did not seek medical attention but"ran a errand and then went to the [employer] house where she was very drowsy and at times so drowsy she would not talk to people".  Currently she has received benadryl and reglan.  She is very drowsy but able to follow commands.  She gives poor effort on both history and physical exam. She has no complaints of HA, weakness or vertigo.   Date last known well: Date: 06/02/2014 Time last known well: Unable to determine tPA Given: No: resolved symptoms  Past Medical History  Diagnosis Date  . Scoliosis   . Chronic back pain   . Seizures     started 9/12-  . Headache(784.0)   . Chronic back pain   . Partial tear subscapularis tendon 12/27/2011  . Ovarian cyst   . Bruising, spontaneous 12/08/2013    Past Surgical History  Procedure Laterality Date  . Back surgery  1989    scoliosis throsic-rods  . Tubal ligation    . Cesarean section    . Abdominal hysterectomy  2012  . Left arm    . Eus N/A 11/03/2012    Procedure: UPPER ENDOSCOPIC ULTRASOUND (EUS) LINEAR;  Surgeon: Beryle Beams, MD;  Location: [facility];  Service:  Endoscopy;  Laterality: N/A;  . Cholecystectomy N/A 11/17/2012    Procedure: LAPAROSCOPIC CHOLECYSTECTOMY WITH INTRAOPERATIVE CHOLANGIOGRAM;  Surgeon: Ralene Ok, MD;  Location: [place];  Service: General;  Laterality: N/A;  . Neck surgery    . Ercp N/A 05/07/2013    Procedure: ENDOSCOPIC RETROGRADE CHOLANGIOPANCREATOGRAPHY (ERCP);  Surgeon: Beryle Beams, MD;  Location: [facility];  Service: Endoscopy;  Laterality: N/A;  start ercp first, if canulation fails switch to EUS     Family History  Problem Relation Age of Onset  . Hypertension Mother   . Hypertension Father   . Cancer Father     prostate  .  Heart disease Father   . Hypertension Brother   . Hypertension Brother    Social History:  reports that she quit smoking about 9 months ago. Her smoking use included Cigarettes. She has a 15 pack-year smoking history. She has never used smokeless tobacco. She reports that she drinks alcohol. She reports that she does not use illicit drugs.  Allergies:  Allergies  Allergen Reactions  . Tramadol Other (See Comments)    Possible seizures    Medications:                                                                                                                           No current facility-administered medications for this encounter.   Current Outpatient Prescriptions  Medication Sig Dispense Refill  . acetaminophen (TYLENOL) 500 MG tablet Take 1,000 mg by mouth every 6 (six) hours as needed for moderate pain.     Marland Kitchen amitriptyline (ELAVIL) 10 MG tablet Take 30 mg by mouth at bedtime.     . diazepam (VALIUM) 5 MG tablet Take 5 mg by mouth every 8 (eight) hours as needed for anxiety or sedation.     . gabapentin (NEURONTIN) 300 MG capsule Take 900-1,200 mg by mouth 2 (two) times daily. Take 3 capsule in the morning and 4 capsule at night    . meclizine (ANTIVERT) 25 MG tablet Take 25 mg by mouth daily as needed for dizziness.     . naratriptan (AMERGE) 2.5 MG tablet     .  omeprazole (PRILOSEC) 20 MG capsule Take 1 capsule (20 mg total) by mouth daily. 14 capsule 2  . oxyCODONE (OXY IR/ROXICODONE) 5 MG immediate release tablet Take 5 mg by mouth every 6 (six) hours.     . sertraline (ZOLOFT) 100 MG tablet Take 100 mg by mouth every morning.     Marland Kitchen HYDROcodone-acetaminophen (NORCO) 10-325 MG per tablet Take 1 tablet by mouth every 4 (four) hours as needed for severe pain.    Marland Kitchen HYDROmorphone (DILAUDID) 4 MG tablet Take 1 tablet (4 mg total) by mouth every 4 (four) hours as needed for moderate pain or severe pain (refractory pain). (Patient not taking: Reported on 06/03/2014) 65 tablet 0  . magnesium oxide (MAG-OX) 400 MG tablet Take 400 mg by mouth at bedtime.    . Multiple Vitamin (MULTIVITAMIN WITH MINERALS) TABS tablet Take 1 tablet by mouth daily.    Marland Kitchen neomycin-polymyxin-pramoxine ([employer] ANTIBIOTIC PLUS) 1 % cream Apply topically 2 (two) times daily. (Patient not taking: Reported on 06/03/2014) 14.2 g 2  . ondansetron (ZOFRAN) 4 MG tablet Take 1 tablet (4 mg total) by mouth every 6 (six) hours as needed for nausea. (Patient not taking: Reported on 06/03/2014) 65 tablet 0  . rOPINIRole (REQUIP) 0.25 MG tablet Take 0.25 mg by mouth at bedtime.    Marland Kitchen tiZANidine (ZANAFLEX) 2 MG tablet Take 2 mg by mouth every 6 (six) hours as needed (for muscle spasms).    . zolpidem (  AMBIEN) 10 MG tablet Take 0.5 tablets (5 mg total) by mouth at bedtime as needed for sleep. (Patient not taking: Reported on 06/03/2014) 30 tablet 0     ROS:                                                                                                                                       History obtained from the patient  General ROS: negative for - chills, fatigue, fever, night sweats, weight gain or weight loss Psychological ROS: negative for - behavioral disorder, hallucinations, memory difficulties, mood swings or suicidal ideation Ophthalmic ROS: negative for - blurry vision, double vision, eye pain  or loss of vision ENT ROS: negative for - epistaxis, nasal discharge, oral lesions, sore throat, tinnitus or vertigo Allergy and Immunology ROS: negative for - hives or itchy/watery eyes Hematological and Lymphatic ROS: negative for - bleeding problems, bruising or swollen lymph nodes Endocrine ROS: negative for - galactorrhea, hair pattern changes, polydipsia/polyuria or temperature intolerance Respiratory ROS: negative for - cough, hemoptysis, shortness of breath or wheezing Cardiovascular ROS: negative for - chest pain, dyspnea on exertion, edema or irregular heartbeat Gastrointestinal ROS: negative for - abdominal pain, diarrhea, hematemesis, nausea/vomiting or stool incontinence Genito-Urinary ROS: negative for - dysuria, hematuria, incontinence or urinary frequency/urgency Musculoskeletal ROS: negative for - joint swelling or muscular weakness Neurological ROS: as noted in HPI Dermatological ROS: negative for rash and skin lesion changes  Neurologic Examination:                                                                                                      Blood pressure 125/70, pulse 101, temperature 98.3 F (36.8 C), temperature source Oral, resp. rate 18, SpO2 99 %.   Physical Exam  Constitutional: He appears well-developed and well-nourished.  Psych: Affect appropriate to situation Eyes: No scleral injection HENT: No OP obstrucion Head: Normocephalic.  Cardiovascular: Normal rate and regular rhythm.  Respiratory: Effort normal and breath sounds normal.  GI: Soft. Bowel sounds are normal. No distension. There is no tenderness.  Skin: WDI  Neuro Exam: Mental Status: Drowsy, oriented, thought content appropriate.  Speech fluent without evidence of aphasia.  Able to follow 3 step commands without difficulty. Cranial Nerves: II: Discs flat bilaterally; Visual fields grossly normal, pupils equal, round, reactive to light and accommodation III,IV, VI: ptosis not present,  extra-ocular motions intact bilaterally V,VII: smile symmetric, facial light touch sensation normal bilaterally VIII: hearing normal bilaterally IX,X: gag reflex present XI: bilateral  shoulder shrug XII: midline tongue extension without atrophy or fasciculations  Motor: Right : Upper extremity   5/5    Left:     Upper extremity   5/5  Lower extremity   5/5     Lower extremity   5/5 Tone and bulk:normal tone throughout; no atrophy noted Sensory: Pinprick and light touch intact throughout, bilaterally Deep Tendon Reflexes:  Right: Upper Extremity   Left: Upper extremity   biceps (C-5 to C-6) 2/4   biceps (C-5 to C-6) 2/4 tricep (C7) 2/4    triceps (C7) 2/4 Brachioradialis (C6) 2/4  Brachioradialis (C6) 2/4  Lower Extremity Lower Extremity  quadriceps (L-2 to L-4) 2/4   quadriceps (L-2 to L-4) 2/4 Achilles (S1) 2/4   Achilles (S1) 2/4  Plantars: Right: downgoing   Left: downgoing Cerebellar: normal finger-to-nose,  normal heel-to-shin test Gait: not tested due to drowsiness and saftey CV: pulses palpable throughout    Lab Results: Basic Metabolic Panel:  Recent Labs Lab 06/03/14 1250 06/03/14 1308  NA 139 140  K 3.6* 3.3*  CL 102 104  CO2 21  --   GLUCOSE 78 79  BUN 16 18  CREATININE 0.65 0.60  CALCIUM 8.9  --     Liver Function Tests:  Recent Labs Lab 06/03/14 1250  AST 53*  ALT 15  ALKPHOS 122*  BILITOT <0.2*  PROT 7.5  ALBUMIN 4.0   No results for input(s): LIPASE, AMYLASE in the last 168 hours. No results for input(s): AMMONIA in the last 168 hours.  CBC:  Recent Labs Lab 06/03/14 1250 06/03/14 1308  WBC 8.1  --   NEUTROABS 5.2  --   HGB 14.1 15.0  HCT 40.6 44.0  MCV 88.5  --   PLT 228  --     Cardiac Enzymes: No results for input(s): CKTOTAL, CKMB, CKMBINDEX, TROPONINI in the last 168 hours.  Lipid Panel: No results for input(s): CHOL, TRIG, HDL, CHOLHDL, VLDL, LDLCALC in the last 168 hours.  CBG: No results for input(s): GLUCAP in  the last 168 hours.  Microbiology: Results for orders placed or performed during the hospital encounter of 11/12/12  Surgical pcr screen     Status: None   Collection Time: 11/12/12 11:34 AM  Result Value Ref Range Status   MRSA, PCR NEGATIVE NEGATIVE Final   Staphylococcus aureus NEGATIVE NEGATIVE Final    Comment:        The Xpert SA Assay (FDA approved for NASAL specimens in patients over 71 years of age), is one component of a comprehensive surveillance program.  Test performance has been validated by [employer] for patients greater than or equal to 61 year old. It is not intended to diagnose infection nor to guide or monitor treatment.    Coagulation Studies:  Recent Labs  06/03/14 1250  LABPROT 14.1  INR 1.08    Imaging: Ct Head Wo Contrast  06/03/2014   CLINICAL DATA:  Acute onset of lethargy earlier today, associated with left lower extremity numbness. Prior seizure history.  EXAM: CT HEAD WITHOUT CONTRAST  TECHNIQUE: Contiguous axial images were obtained from the base of the skull through the vertex without intravenous contrast.  COMPARISON:  MRI brain 04/19/2011.  CT head 03/26/2011.  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 Ventricular system normal in size and appearance for age. No mass lesion. No midline shift. No acute hemorrhage or hematoma. No extra-axial fluid collections. No  evidence of acute infarction. No interval change since the prior CT.  No skull fracture or other focal osseous abnormality involving the skull. Visualized paranasal sinuses, bilateral mastoid air cells and bilateral middle ear cavities well-aerated.  IMPRESSION: 1. No acute intracranial abnormality. No interval change since 03/2011. 2. 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   Electronically Signed   By: Evangeline Dakin M.D.   On: 06/03/2014 14:50        Assessment and plan discussed with with attending physician and they are in agreement.    Etta Quill PA-C [facility] [phone]  06/03/2014, 4:20 PM   Assessment: 44 y.o. female with transient Right leg weakness and gait imbalance which resolved by the time she arrived in ED.  Currently she is very drowsy from medications.  Exam is non focal and shows poor effort. Concern is for TIA.   Stroke Risk Factors - none  Recommend: 1) MRI brain/MRI brain without contrast 2) If negative no further stroke work up recommended.  3) PT to evaluate gait safety.  Patient seen and examined together with physician assistant and I concur with the assessment and plan.  Dorian Pod, MD

## 2014-06-03 NOTE — ED Provider Notes (Signed)
Patient seen/examined in the Emergency Department in conjunction with Midlevel Provider [facility] Patient reports headache, speech difficult and focal weakness, now resolved Exam : resting comfortably, no distress noted.  She is drowsy from her medications and difficult to participate in exam Plan: will admit for TIA   Sharyon Cable, MD 06/03/14 1534

## 2014-06-03 NOTE — ED Provider Notes (Signed)
Care assumed from Montine Circle, PA-C at shift change. Pt with headache and resolved right sided weakness, numbness/tingling. Neurology consulting patient. Plan to obtain MRI, if normal, d/c home. If abnormal finding, will admit to medicine. 8:05 PM MRI/MRA without acute findings. Discussed results with patient. On exam she is resting comfortably and in NAD. Vitals remain stable. Pt is stable for d/c. F/u with PCP. Return precautions given. Patient states understanding of treatment care plan and is agreeable.  Ct Head Wo Contrast  06/03/2014   CLINICAL DATA:  Acute onset of lethargy earlier today, associated with left lower extremity numbness. Prior seizure history.  EXAM: CT HEAD WITHOUT CONTRAST  TECHNIQUE: Contiguous axial images were obtained from the base of the skull through the vertex without intravenous contrast.  COMPARISON:  MRI brain 04/19/2011.  CT head 03/26/2011.  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 Ventricular system normal in size and appearance for age. No mass lesion. No midline shift. No acute hemorrhage or hematoma. No extra-axial fluid collections. No evidence of acute infarction. No interval change since the prior CT.  No skull fracture or other focal osseous abnormality involving the skull. Visualized paranasal sinuses, bilateral mastoid air cells and bilateral middle ear cavities well-aerated.  IMPRESSION: 1. No acute intracranial abnormality. No interval change since 03/2011. 2. 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   Electronically Signed   By: Evangeline Dakin M.D.   On: 06/03/2014 14:50   Mr Jodene Nam Head Wo Contrast  06/03/2014   CLINICAL DATA:  TIA.  History of seizures.  EXAM: MRI HEAD WITHOUT CONTRAST  MRA HEAD WITHOUT CONTRAST  TECHNIQUE: Multiplanar, multiecho pulse sequences of the brain and surrounding structures were obtained  without intravenous contrast. Angiographic images of the head were obtained using MRA technique without contrast.  COMPARISON:  CT head 06/03/2014  FINDINGS: MRI HEAD FINDINGS  Ventricle size is normal. Craniocervical junction normal. Pituitary normal. Cerebral volume normal.  Small focus of chronic hemorrhage in the left temporoparietal lobe with hemosiderin. This all be due to a small cavernoma which has bled.  Small hyperintensities in the right centrum semiovale likely related to chronic microvascular ischemia. Brainstem and cerebellum normal.  Negative for mass lesion.  Medial temporal lobe is normal in signal and volume bilaterally.  Paranasal sinuses are clear.  MRA HEAD FINDINGS  Both vertebral arteries are patent to the basilar. PICA patent bilaterally. Basilar widely patent. Superior cerebellar and posterior cerebral arteries are widely patent  The internal carotid artery is widely patent bilaterally without stenosis. Anterior and middle cerebral arteries are normal.  Negative for cerebral aneurysm.  IMPRESSION: Negative for acute infarct.  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  Negative MRA head.   Electronically Signed   By: Franchot Gallo M.D.   On: 06/03/2014 19:43   Mr Brain Wo Contrast  06/03/2014   CLINICAL DATA:  TIA.  History of seizures.  EXAM: MRI HEAD WITHOUT CONTRAST  MRA HEAD WITHOUT CONTRAST  TECHNIQUE: Multiplanar, multiecho pulse sequences of the brain and surrounding structures were obtained without intravenous contrast. Angiographic images of the head were obtained using MRA technique without contrast.  COMPARISON:  CT head 06/03/2014  FINDINGS: MRI HEAD FINDINGS  Ventricle size is normal. Craniocervical junction  normal. Pituitary normal. Cerebral volume normal.  Small focus of chronic hemorrhage in the left temporoparietal  lobe with hemosiderin. This all be due to a small cavernoma which has bled.  Small hyperintensities in the right centrum semiovale likely related to chronic microvascular ischemia. Brainstem and cerebellum normal.  Negative for mass lesion.  Medial temporal lobe is normal in signal and volume bilaterally.  Paranasal sinuses are clear.  MRA HEAD FINDINGS  Both vertebral arteries are patent to the basilar. PICA patent bilaterally. Basilar widely patent. Superior cerebellar and posterior cerebral arteries are widely patent  The internal carotid artery is widely patent bilaterally without stenosis. Anterior and middle cerebral arteries are normal.  Negative for cerebral aneurysm.  IMPRESSION: Negative for acute infarct.  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  Negative MRA head.   Electronically Signed   By: Franchot Gallo M.D.   On: 06/03/2014 19:43    Kashawn Dirr Cletus Gash, PA-C 06/03/14 2007  Evelina Bucy, MD 06/04/14 Benancio Deeds

## 2014-06-03 NOTE — ED Notes (Signed)
She said she woke this morning and did not feel well, her head was hurting and "my speech felt like i was drunk." she states that her speech "feels better now." she is able to speak in clear comprehensible sentences. She states she has a history of seizures, migraines and vertigo and feels this may be related. She took a meclizine pta. She is A&Ox4, breathing easily, MAE

## 2014-06-13 DIAGNOSIS — M545 Low back pain: Secondary | ICD-10-CM | POA: Diagnosis not present

## 2014-06-13 DIAGNOSIS — Z5189 Encounter for other specified aftercare: Secondary | ICD-10-CM | POA: Diagnosis not present

## 2014-06-13 DIAGNOSIS — R262 Difficulty in walking, not elsewhere classified: Secondary | ICD-10-CM | POA: Diagnosis not present

## 2014-06-13 DIAGNOSIS — R2681 Unsteadiness on feet: Secondary | ICD-10-CM | POA: Diagnosis not present

## 2014-06-29 ENCOUNTER — Ambulatory Visit: Payer: Self-pay | Admitting: Pain Medicine

## 2014-06-29 DIAGNOSIS — R51 Headache: Secondary | ICD-10-CM | POA: Diagnosis not present

## 2014-06-29 DIAGNOSIS — M47817 Spondylosis without myelopathy or radiculopathy, lumbosacral region: Secondary | ICD-10-CM | POA: Diagnosis not present

## 2014-06-29 DIAGNOSIS — Z9889 Other specified postprocedural states: Secondary | ICD-10-CM | POA: Diagnosis not present

## 2014-06-29 DIAGNOSIS — M47894 Other spondylosis, thoracic region: Secondary | ICD-10-CM | POA: Diagnosis not present

## 2014-06-29 DIAGNOSIS — M5416 Radiculopathy, lumbar region: Secondary | ICD-10-CM | POA: Diagnosis not present

## 2014-06-29 DIAGNOSIS — M47892 Other spondylosis, cervical region: Secondary | ICD-10-CM | POA: Diagnosis not present

## 2014-06-29 DIAGNOSIS — M542 Cervicalgia: Secondary | ICD-10-CM | POA: Diagnosis not present

## 2014-06-29 DIAGNOSIS — R569 Unspecified convulsions: Secondary | ICD-10-CM | POA: Diagnosis not present

## 2014-07-07 DIAGNOSIS — R079 Chest pain, unspecified: Secondary | ICD-10-CM | POA: Diagnosis not present

## 2014-07-07 DIAGNOSIS — M5430 Sciatica, unspecified side: Secondary | ICD-10-CM | POA: Diagnosis not present

## 2014-07-07 DIAGNOSIS — M5440 Lumbago with sciatica, unspecified side: Secondary | ICD-10-CM | POA: Diagnosis not present

## 2014-07-07 DIAGNOSIS — M546 Pain in thoracic spine: Secondary | ICD-10-CM | POA: Diagnosis not present

## 2014-07-07 DIAGNOSIS — G8929 Other chronic pain: Secondary | ICD-10-CM | POA: Diagnosis not present

## 2014-07-07 DIAGNOSIS — M5442 Lumbago with sciatica, left side: Secondary | ICD-10-CM | POA: Diagnosis not present

## 2014-07-07 DIAGNOSIS — F17210 Nicotine dependence, cigarettes, uncomplicated: Secondary | ICD-10-CM | POA: Diagnosis not present

## 2014-07-18 DIAGNOSIS — Z09 Encounter for follow-up examination after completed treatment for conditions other than malignant neoplasm: Secondary | ICD-10-CM | POA: Diagnosis not present

## 2014-07-18 DIAGNOSIS — Z4789 Encounter for other orthopedic aftercare: Secondary | ICD-10-CM | POA: Diagnosis not present

## 2014-07-18 DIAGNOSIS — F17210 Nicotine dependence, cigarettes, uncomplicated: Secondary | ICD-10-CM | POA: Diagnosis not present

## 2014-07-18 DIAGNOSIS — Z981 Arthrodesis status: Secondary | ICD-10-CM | POA: Diagnosis not present

## 2014-07-26 ENCOUNTER — Ambulatory Visit: Payer: Self-pay | Admitting: Pain Medicine

## 2014-07-26 DIAGNOSIS — M5416 Radiculopathy, lumbar region: Secondary | ICD-10-CM | POA: Diagnosis not present

## 2014-07-26 DIAGNOSIS — M47817 Spondylosis without myelopathy or radiculopathy, lumbosacral region: Secondary | ICD-10-CM | POA: Diagnosis not present

## 2014-07-26 DIAGNOSIS — R51 Headache: Secondary | ICD-10-CM | POA: Diagnosis not present

## 2014-07-26 DIAGNOSIS — M542 Cervicalgia: Secondary | ICD-10-CM | POA: Diagnosis not present

## 2014-07-26 DIAGNOSIS — M5126 Other intervertebral disc displacement, lumbar region: Secondary | ICD-10-CM | POA: Diagnosis not present

## 2014-07-26 DIAGNOSIS — M47896 Other spondylosis, lumbar region: Secondary | ICD-10-CM | POA: Diagnosis not present

## 2014-07-26 DIAGNOSIS — M47894 Other spondylosis, thoracic region: Secondary | ICD-10-CM | POA: Diagnosis not present

## 2014-08-08 DIAGNOSIS — G43909 Migraine, unspecified, not intractable, without status migrainosus: Secondary | ICD-10-CM | POA: Diagnosis not present

## 2014-08-08 DIAGNOSIS — Z681 Body mass index (BMI) 19 or less, adult: Secondary | ICD-10-CM | POA: Diagnosis not present

## 2014-08-08 DIAGNOSIS — Z981 Arthrodesis status: Secondary | ICD-10-CM | POA: Diagnosis not present

## 2014-08-08 DIAGNOSIS — M419 Scoliosis, unspecified: Secondary | ICD-10-CM | POA: Diagnosis present

## 2014-08-08 DIAGNOSIS — E440 Moderate protein-calorie malnutrition: Secondary | ICD-10-CM | POA: Diagnosis present

## 2014-08-08 DIAGNOSIS — F419 Anxiety disorder, unspecified: Secondary | ICD-10-CM | POA: Diagnosis present

## 2014-08-08 DIAGNOSIS — R079 Chest pain, unspecified: Secondary | ICD-10-CM | POA: Diagnosis not present

## 2014-08-08 DIAGNOSIS — F329 Major depressive disorder, single episode, unspecified: Secondary | ICD-10-CM | POA: Diagnosis present

## 2014-08-08 DIAGNOSIS — M4125 Other idiopathic scoliosis, thoracolumbar region: Secondary | ICD-10-CM | POA: Diagnosis not present

## 2014-08-08 DIAGNOSIS — M545 Low back pain: Secondary | ICD-10-CM | POA: Diagnosis not present

## 2014-08-08 DIAGNOSIS — R569 Unspecified convulsions: Secondary | ICD-10-CM | POA: Diagnosis not present

## 2014-08-08 DIAGNOSIS — G8929 Other chronic pain: Secondary | ICD-10-CM | POA: Diagnosis present

## 2014-08-14 DIAGNOSIS — R079 Chest pain, unspecified: Secondary | ICD-10-CM | POA: Diagnosis not present

## 2014-08-14 DIAGNOSIS — R569 Unspecified convulsions: Secondary | ICD-10-CM | POA: Diagnosis not present

## 2014-08-24 DIAGNOSIS — R569 Unspecified convulsions: Secondary | ICD-10-CM | POA: Diagnosis not present

## 2014-08-25 DIAGNOSIS — G4089 Other seizures: Secondary | ICD-10-CM | POA: Diagnosis not present

## 2014-08-25 DIAGNOSIS — G43709 Chronic migraine without aura, not intractable, without status migrainosus: Secondary | ICD-10-CM | POA: Diagnosis not present

## 2014-08-25 DIAGNOSIS — F411 Generalized anxiety disorder: Secondary | ICD-10-CM | POA: Diagnosis not present

## 2014-08-25 DIAGNOSIS — Q8501 Neurofibromatosis, type 1: Secondary | ICD-10-CM | POA: Diagnosis not present

## 2014-08-30 ENCOUNTER — Ambulatory Visit: Payer: Self-pay | Admitting: Pain Medicine

## 2014-08-30 DIAGNOSIS — M542 Cervicalgia: Secondary | ICD-10-CM | POA: Diagnosis not present

## 2014-08-30 DIAGNOSIS — M47817 Spondylosis without myelopathy or radiculopathy, lumbosacral region: Secondary | ICD-10-CM | POA: Diagnosis not present

## 2014-08-30 DIAGNOSIS — M419 Scoliosis, unspecified: Secondary | ICD-10-CM | POA: Diagnosis not present

## 2014-08-30 DIAGNOSIS — M5416 Radiculopathy, lumbar region: Secondary | ICD-10-CM | POA: Diagnosis not present

## 2014-08-30 DIAGNOSIS — M545 Low back pain: Secondary | ICD-10-CM | POA: Diagnosis not present

## 2014-08-30 DIAGNOSIS — M25562 Pain in left knee: Secondary | ICD-10-CM | POA: Diagnosis not present

## 2014-08-30 DIAGNOSIS — F17200 Nicotine dependence, unspecified, uncomplicated: Secondary | ICD-10-CM | POA: Diagnosis not present

## 2014-08-30 DIAGNOSIS — M25561 Pain in right knee: Secondary | ICD-10-CM | POA: Diagnosis not present

## 2014-09-07 ENCOUNTER — Ambulatory Visit: Payer: Self-pay | Admitting: Pain Medicine

## 2014-09-07 DIAGNOSIS — M419 Scoliosis, unspecified: Secondary | ICD-10-CM | POA: Diagnosis not present

## 2014-09-07 DIAGNOSIS — M170 Bilateral primary osteoarthritis of knee: Secondary | ICD-10-CM | POA: Diagnosis not present

## 2014-09-07 DIAGNOSIS — F17200 Nicotine dependence, unspecified, uncomplicated: Secondary | ICD-10-CM | POA: Diagnosis not present

## 2014-09-07 DIAGNOSIS — M25561 Pain in right knee: Secondary | ICD-10-CM | POA: Diagnosis not present

## 2014-09-19 DIAGNOSIS — Z4782 Encounter for orthopedic aftercare following scoliosis surgery: Secondary | ICD-10-CM | POA: Diagnosis not present

## 2014-09-19 DIAGNOSIS — Z981 Arthrodesis status: Secondary | ICD-10-CM | POA: Diagnosis not present

## 2014-09-19 DIAGNOSIS — F17210 Nicotine dependence, cigarettes, uncomplicated: Secondary | ICD-10-CM | POA: Diagnosis not present

## 2014-09-28 ENCOUNTER — Ambulatory Visit
Admit: 2014-09-28 | Disposition: A | Discharge status: Home / Self Care | Payer: Self-pay | Attending: Pain Medicine | Admitting: Pain Medicine

## 2014-09-28 DIAGNOSIS — M79605 Pain in left leg: Secondary | ICD-10-CM | POA: Diagnosis not present

## 2014-09-28 DIAGNOSIS — M79602 Pain in left arm: Secondary | ICD-10-CM | POA: Diagnosis not present

## 2014-09-28 DIAGNOSIS — R0782 Intercostal pain: Secondary | ICD-10-CM | POA: Diagnosis not present

## 2014-09-28 DIAGNOSIS — M545 Low back pain: Secondary | ICD-10-CM | POA: Diagnosis not present

## 2014-09-28 DIAGNOSIS — M792 Neuralgia and neuritis, unspecified: Secondary | ICD-10-CM | POA: Diagnosis not present

## 2014-09-28 DIAGNOSIS — R51 Headache: Secondary | ICD-10-CM | POA: Diagnosis not present

## 2014-09-28 DIAGNOSIS — M461 Sacroiliitis, not elsewhere classified: Secondary | ICD-10-CM | POA: Diagnosis not present

## 2014-09-28 DIAGNOSIS — M542 Cervicalgia: Secondary | ICD-10-CM | POA: Diagnosis not present

## 2014-09-28 DIAGNOSIS — M79604 Pain in right leg: Secondary | ICD-10-CM | POA: Diagnosis not present

## 2014-09-28 DIAGNOSIS — M79601 Pain in right arm: Secondary | ICD-10-CM | POA: Diagnosis not present

## 2014-09-28 DIAGNOSIS — M170 Bilateral primary osteoarthritis of knee: Secondary | ICD-10-CM | POA: Diagnosis not present

## 2014-10-05 ENCOUNTER — Ambulatory Visit
Admit: 2014-10-05 | Disposition: A | Discharge status: Home / Self Care | Payer: Self-pay | Attending: Pain Medicine | Admitting: Pain Medicine

## 2014-10-05 DIAGNOSIS — M170 Bilateral primary osteoarthritis of knee: Secondary | ICD-10-CM | POA: Diagnosis not present

## 2014-10-05 DIAGNOSIS — M25562 Pain in left knee: Secondary | ICD-10-CM | POA: Diagnosis not present

## 2014-10-27 ENCOUNTER — Ambulatory Visit: Payer: Medicare Other | Attending: Pain Medicine | Admitting: Pain Medicine

## 2014-10-27 ENCOUNTER — Encounter (INDEPENDENT_AMBULATORY_CARE_PROVIDER_SITE_OTHER): Payer: Self-pay

## 2014-10-27 ENCOUNTER — Encounter: Payer: Self-pay | Admitting: Pain Medicine

## 2014-10-27 VITALS — BP 103/65 | HR 84 | Temp 98.2°F | Resp 16 | Ht 61.0 in | Wt 94.0 lb

## 2014-10-27 DIAGNOSIS — G43909 Migraine, unspecified, not intractable, without status migrainosus: Secondary | ICD-10-CM | POA: Insufficient documentation

## 2014-10-27 DIAGNOSIS — M5136 Other intervertebral disc degeneration, lumbar region: Secondary | ICD-10-CM | POA: Diagnosis not present

## 2014-10-27 DIAGNOSIS — M549 Dorsalgia, unspecified: Secondary | ICD-10-CM | POA: Diagnosis present

## 2014-10-27 DIAGNOSIS — M5416 Radiculopathy, lumbar region: Secondary | ICD-10-CM | POA: Diagnosis not present

## 2014-10-27 DIAGNOSIS — R569 Unspecified convulsions: Secondary | ICD-10-CM | POA: Insufficient documentation

## 2014-10-27 DIAGNOSIS — M5481 Occipital neuralgia: Secondary | ICD-10-CM | POA: Insufficient documentation

## 2014-10-27 DIAGNOSIS — M179 Osteoarthritis of knee, unspecified: Secondary | ICD-10-CM | POA: Diagnosis not present

## 2014-10-27 DIAGNOSIS — M5030 Other cervical disc degeneration, unspecified cervical region: Secondary | ICD-10-CM | POA: Insufficient documentation

## 2014-10-27 DIAGNOSIS — M47817 Spondylosis without myelopathy or radiculopathy, lumbosacral region: Secondary | ICD-10-CM | POA: Diagnosis not present

## 2014-10-27 DIAGNOSIS — M792 Neuralgia and neuritis, unspecified: Secondary | ICD-10-CM | POA: Diagnosis not present

## 2014-10-27 DIAGNOSIS — M170 Bilateral primary osteoarthritis of knee: Secondary | ICD-10-CM | POA: Diagnosis not present

## 2014-10-27 DIAGNOSIS — M19019 Primary osteoarthritis, unspecified shoulder: Secondary | ICD-10-CM | POA: Diagnosis not present

## 2014-10-27 DIAGNOSIS — M5134 Other intervertebral disc degeneration, thoracic region: Secondary | ICD-10-CM | POA: Insufficient documentation

## 2014-10-27 DIAGNOSIS — M5137 Other intervertebral disc degeneration, lumbosacral region: Secondary | ICD-10-CM

## 2014-10-27 DIAGNOSIS — M51379 Other intervertebral disc degeneration, lumbosacral region without mention of lumbar back pain or lower extremity pain: Secondary | ICD-10-CM | POA: Insufficient documentation

## 2014-10-27 MED ORDER — DIAZEPAM 5 MG PO TABS: ORAL_TABLET | ORAL | Status: DC

## 2014-10-27 MED ORDER — OXYCODONE HCL 5 MG PO TABS: ORAL_TABLET | ORAL | Status: DC

## 2014-10-27 MED ORDER — AMITRIPTYLINE HCL 10 MG PO TABS: ORAL_TABLET | ORAL | Status: DC

## 2014-10-27 NOTE — Progress Notes (Signed)
   Subjective:    Patient ID: Sharon Welch, female    DOB: Dec 01, 1970, 44 y.o.   MRN: [id number]  HPI    Review of Systems  Gastrointestinal: Negative for abdominal pain and abdominal distention.  Genitourinary: Positive for flank pain.  Musculoskeletal: Positive for myalgias, back pain, joint swelling, arthralgias, gait problem and neck pain. Negative for neck stiffness.  Neurological: Positive for weakness, numbness and headaches. Negative for seizures.       Objective:   Physical Exam  Constitutional: She is oriented to person, place, and time.  HENT:  Head: Normocephalic.  Eyes: Pupils are equal, round, and reactive to light.  Cardiovascular: Normal rate and regular rhythm.   Pulmonary/Chest: Breath sounds normal.  Abdominal: She exhibits no distension and no mass. There is no tenderness. There is no rebound and no guarding.  Musculoskeletal: She exhibits tenderness.  Lymphadenopathy:    She has no cervical adenopathy.  Neurological: She is alert and oriented to person, place, and time. She has normal reflexes.  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

## 2014-10-27 NOTE — Progress Notes (Signed)
discharded  Scripts given oxycodone  Valium meds due 11/28/14 ambulatory

## 2014-11-01 DIAGNOSIS — M25561 Pain in right knee: Secondary | ICD-10-CM | POA: Diagnosis not present

## 2014-11-01 DIAGNOSIS — F17210 Nicotine dependence, cigarettes, uncomplicated: Secondary | ICD-10-CM | POA: Diagnosis not present

## 2014-11-01 DIAGNOSIS — M25562 Pain in left knee: Secondary | ICD-10-CM | POA: Diagnosis not present

## 2014-11-02 DIAGNOSIS — H6501 Acute serous otitis media, right ear: Secondary | ICD-10-CM | POA: Diagnosis not present

## 2014-11-03 NOTE — Addendum Note (Signed)
Addended by: Dewayne Shorter on: 11/03/2014 11:12 AM   Modules accepted: Orders

## 2014-11-16 ENCOUNTER — Other Ambulatory Visit: Payer: Self-pay | Admitting: Pain Medicine

## 2014-11-27 ENCOUNTER — Other Ambulatory Visit: Payer: Self-pay | Admitting: Pain Medicine

## 2014-11-27 DIAGNOSIS — M5481 Occipital neuralgia: Secondary | ICD-10-CM

## 2014-11-27 DIAGNOSIS — S46812D Strain of other muscles, fascia and tendons at shoulder and upper arm level, left arm, subsequent encounter: Secondary | ICD-10-CM

## 2014-11-27 DIAGNOSIS — M5030 Other cervical disc degeneration, unspecified cervical region: Secondary | ICD-10-CM

## 2014-11-27 DIAGNOSIS — S4382XD Sprain of other specified parts of left shoulder girdle, subsequent encounter: Secondary | ICD-10-CM

## 2014-11-27 DIAGNOSIS — M5137 Other intervertebral disc degeneration, lumbosacral region: Secondary | ICD-10-CM

## 2014-11-27 DIAGNOSIS — M5134 Other intervertebral disc degeneration, thoracic region: Secondary | ICD-10-CM

## 2014-11-29 ENCOUNTER — Encounter: Payer: Self-pay | Admitting: Pain Medicine

## 2014-11-29 ENCOUNTER — Ambulatory Visit: Payer: Medicare Other | Attending: Pain Medicine | Admitting: Pain Medicine

## 2014-11-29 VITALS — BP 93/71 | HR 77 | Temp 98.6°F | Resp 16 | Ht 61.0 in | Wt 95.0 lb

## 2014-11-29 DIAGNOSIS — R079 Chest pain, unspecified: Secondary | ICD-10-CM | POA: Insufficient documentation

## 2014-11-29 DIAGNOSIS — S4382XD Sprain of other specified parts of left shoulder girdle, subsequent encounter: Secondary | ICD-10-CM

## 2014-11-29 DIAGNOSIS — M170 Bilateral primary osteoarthritis of knee: Secondary | ICD-10-CM | POA: Diagnosis not present

## 2014-11-29 DIAGNOSIS — M5030 Other cervical disc degeneration, unspecified cervical region: Secondary | ICD-10-CM | POA: Insufficient documentation

## 2014-11-29 DIAGNOSIS — M546 Pain in thoracic spine: Secondary | ICD-10-CM | POA: Diagnosis present

## 2014-11-29 DIAGNOSIS — M62838 Other muscle spasm: Secondary | ICD-10-CM | POA: Insufficient documentation

## 2014-11-29 DIAGNOSIS — M533 Sacrococcygeal disorders, not elsewhere classified: Secondary | ICD-10-CM | POA: Diagnosis not present

## 2014-11-29 DIAGNOSIS — S46812D Strain of other muscles, fascia and tendons at shoulder and upper arm level, left arm, subsequent encounter: Secondary | ICD-10-CM

## 2014-11-29 DIAGNOSIS — R51 Headache: Secondary | ICD-10-CM | POA: Diagnosis present

## 2014-11-29 DIAGNOSIS — G588 Other specified mononeuropathies: Secondary | ICD-10-CM | POA: Insufficient documentation

## 2014-11-29 DIAGNOSIS — M19012 Primary osteoarthritis, left shoulder: Secondary | ICD-10-CM | POA: Diagnosis not present

## 2014-11-29 DIAGNOSIS — M5134 Other intervertebral disc degeneration, thoracic region: Secondary | ICD-10-CM | POA: Diagnosis not present

## 2014-11-29 DIAGNOSIS — M19011 Primary osteoarthritis, right shoulder: Secondary | ICD-10-CM | POA: Insufficient documentation

## 2014-11-29 DIAGNOSIS — G43909 Migraine, unspecified, not intractable, without status migrainosus: Secondary | ICD-10-CM | POA: Insufficient documentation

## 2014-11-29 DIAGNOSIS — M419 Scoliosis, unspecified: Secondary | ICD-10-CM | POA: Diagnosis not present

## 2014-11-29 DIAGNOSIS — M5136 Other intervertebral disc degeneration, lumbar region: Secondary | ICD-10-CM | POA: Insufficient documentation

## 2014-11-29 DIAGNOSIS — M47817 Spondylosis without myelopathy or radiculopathy, lumbosacral region: Secondary | ICD-10-CM | POA: Diagnosis not present

## 2014-11-29 DIAGNOSIS — M5481 Occipital neuralgia: Secondary | ICD-10-CM | POA: Insufficient documentation

## 2014-11-29 DIAGNOSIS — M5416 Radiculopathy, lumbar region: Secondary | ICD-10-CM | POA: Diagnosis not present

## 2014-11-29 DIAGNOSIS — M5137 Other intervertebral disc degeneration, lumbosacral region: Secondary | ICD-10-CM

## 2014-11-29 DIAGNOSIS — M542 Cervicalgia: Secondary | ICD-10-CM | POA: Diagnosis present

## 2014-11-29 DIAGNOSIS — M792 Neuralgia and neuritis, unspecified: Secondary | ICD-10-CM | POA: Diagnosis not present

## 2014-11-29 MED ORDER — AMITRIPTYLINE HCL 10 MG PO TABS: ORAL_TABLET | ORAL | Status: DC

## 2014-11-29 MED ORDER — OXYCODONE HCL 5 MG PO TABS: ORAL_TABLET | ORAL | Status: DC

## 2014-11-29 MED ORDER — DIAZEPAM 5 MG PO TABS: ORAL_TABLET | ORAL | Status: DC

## 2014-11-29 NOTE — Patient Instructions (Addendum)
Continue present medications.  Intercostal nerve blocks 82/42/[id number] RISKS AND COMPLICATIONS  What are the risk, side effects and possible complications? Generally speaking, most procedures are safe.  However, with any procedure there are risks, side effects, and the possibility of complications.  The risks and complications are dependent upon the sites that are lesioned, or the type of nerve block to be performed.  The closer the procedure is to the spine, the more serious the risks are.  Great care is taken when placing the radio frequency needles, block needles or lesioning probes, but sometimes complications can occur. 1. Infection: Any time there is an injection through the skin, there is a risk of infection.  This is why sterile conditions are used for these blocks.  There are four possible types of infection. 1. Localized skin infection. 2. Central Nervous System Infection-This can be in the form of Meningitis, which can be deadly. 3. Epidural Infections-This can be in the form of an epidural abscess, which can cause pressure inside of the spine, causing compression of the spinal cord with subsequent paralysis. This would require an emergency surgery to decompress, and there are no guarantees that the patient would recover from the paralysis. 4. Discitis-This is an infection of the intervertebral discs.  It occurs in about 1% of discography procedures.  It is difficult to treat and it may lead to surgery.        2. Pain: the needles have to go through skin and soft tissues, will cause soreness.       3. Damage to internal structures:  The nerves to be lesioned may be near blood vessels or    other nerves which can be potentially damaged.       4. Bleeding: Bleeding is more common if the patient is taking blood thinners such as  aspirin, Coumadin, Ticiid, Plavix, etc., or if he/she have some genetic predisposition  such as hemophilia. Bleeding into the spinal canal can cause compression of  the spinal  cord with subsequent paralysis.  This would require an emergency surgery to  decompress and there are no guarantees that the patient would recover from the  paralysis.       5. Pneumothorax:  Puncturing of a lung is a possibility, every time a needle is introduced in  the area of the chest or upper back.  Pneumothorax refers to free air around the  collapsed lung(s), inside of the thoracic cavity (chest cavity).  Another two possible  complications related to a similar event would include: Hemothorax and Chylothorax.   These are variations of the Pneumothorax, where instead of air around the collapsed  lung(s), you may have blood or chyle, respectively.       6. Spinal headaches: They may occur with any procedures in the area of the spine.       7. Persistent CSF (Cerebro-Spinal Fluid) leakage: This is a rare problem, but may occur  with prolonged intrathecal or epidural catheters either due to the formation of a fistulous  track or a dural tear.       8. Nerve damage: By working so close to the spinal cord, there is always a possibility of  nerve damage, which could be as serious as a permanent spinal cord injury with  paralysis.       9. Death:  Although rare, severe deadly allergic reactions known as "Anaphylactic  reaction" can occur to any of the medications used.      10. Worsening of the symptoms:  We can always make thing worse.  What are the chances of something like this happening? Chances of any of this occuring are extremely low.  By statistics, you have more of a chance of getting killed in a motor vehicle accident: while driving to the hospital than any of the above occurring .  Nevertheless, you should be aware that they are possibilities.  In general, it is similar to taking a shower.  Everybody knows that you can slip, hit your head and get killed.  Does that mean that you should not shower again?  Nevertheless always keep in mind that statistics do not mean anything if you  happen to be on the wrong side of them.  Even if a procedure has a 1 (one) in a 1,000,000 (million) chance of going wrong, it you happen to be that one..Also, keep in mind that by statistics, you have more of a chance of having something go wrong when taking medications.  Who should not have this procedure? If you are on a blood thinning medication (e.g. Coumadin, Plavix, see list of "Blood Thinners"), or if you have an active infection going on, you should not have the procedure.  If you are taking any blood thinners, please inform your physician.  How should I prepare for this procedure?  Do not eat or drink anything at least six hours prior to the procedure.  Bring a driver with you .  It cannot be a taxi.  Come accompanied by an adult that can drive you back, and that is strong enough to help you if your legs get weak or numb from the local anesthetic.  Take all of your medicines the morning of the procedure with just enough water to swallow them.  If you have diabetes, make sure that you are scheduled to have your procedure done first thing in the morning, whenever possible.  If you have diabetes, take only half of your insulin dose and notify our nurse that you have done so as soon as you arrive at the clinic.  If you are diabetic, but only take blood sugar pills (oral hypoglycemic), then do not take them on the morning of your procedure.  You may take them after you have had the procedure.  Do not take aspirin or any aspirin-containing medications, at least eleven (11) days prior to the procedure.  They may prolong bleeding.  Wear loose fitting clothing that may be easy to take off and that you would not mind if it got stained with Betadine or blood.  Do not wear any jewelry or perfume  Remove any nail coloring.  It will interfere with some of our monitoring equipment.  NOTE: Remember that this is not meant to be interpreted as a complete list of all possible complications.   Unforeseen problems may occur.  BLOOD THINNERS The following drugs contain aspirin or other products, which can cause increased bleeding during surgery and should not be taken for 2 weeks prior to and 1 week after surgery.  If you should need take something for relief of minor pain, you may take acetaminophen which is found in Tylenol,m Datril, Anacin-3 and Panadol. It is not blood thinner. The products listed below are.  Do not take any of the products listed below in addition to any listed on your instruction sheet.  A.P.C or A.P.C with Codeine Codeine Phosphate Capsules #3 Ibuprofen Ridaura  ABC compound Congesprin Imuran rimadil  Advil Cope Indocin Robaxisal  Alka-Seltzer Effervescent Pain Reliever and Antacid  Coricidin or Coricidin-D  Indomethacin Rufen  Alka-Seltzer plus Cold Medicine Cosprin Ketoprofen S-A-C Tablets  Anacin Analgesic Tablets or Capsules Coumadin Korlgesic Salflex  Anacin Extra Strength Analgesic tablets or capsules CP-2 Tablets Lanoril Salicylate  Anaprox Cuprimine Capsules Levenox Salocol  Anexsia-D Dalteparin Magan Salsalate  Anodynos Darvon compound Magnesium Salicylate Sine-off  Ansaid Dasin Capsules Magsal Sodium Salicylate  Anturane Depen Capsules Marnal Soma  APF Arthritis pain formula Dewitt's Pills Measurin Stanback  Argesic Dia-Gesic Meclofenamic Sulfinpyrazone  Arthritis Bayer Timed Release Aspirin Diclofenac Meclomen Sulindac  Arthritis pain formula Anacin Dicumarol Medipren Supac  Analgesic (Safety coated) Arthralgen Diffunasal Mefanamic Suprofen  Arthritis Strength Bufferin Dihydrocodeine Mepro Compound Suprol  Arthropan liquid Dopirydamole Methcarbomol with Aspirin Synalgos  ASA tablets/Enseals Disalcid Micrainin Tagament  Ascriptin Doan's Midol Talwin  Ascriptin A/D Dolene Mobidin Tanderil  Ascriptin Extra Strength Dolobid Moblgesic Ticlid  Ascriptin with Codeine Doloprin or Doloprin with Codeine Momentum Tolectin  Asperbuf Duoprin Mono-gesic  Trendar  Aspergum Duradyne Motrin or Motrin IB Triminicin  Aspirin plain, buffered or enteric coated Durasal Myochrisine Trigesic  Aspirin Suppositories Easprin Nalfon Trillsate  Aspirin with Codeine Ecotrin Regular or Extra Strength Naprosyn Uracel  Atromid-S Efficin Naproxen Ursinus  Auranofin Capsules Elmiron Neocylate Vanquish  Axotal Emagrin Norgesic Verin  Azathioprine Empirin or Empirin with Codeine Normiflo Vitamin E  Azolid Emprazil Nuprin Voltaren  Bayer Aspirin plain, buffered or children's or timed BC Tablets or powders Encaprin Orgaran Warfarin Sodium  Buff-a-Comp Enoxaparin Orudis Zorpin  Buff-a-Comp with Codeine Equegesic Os-Cal-Gesic   Buffaprin Excedrin plain, buffered or Extra Strength Oxalid   Bufferin Arthritis Strength Feldene Oxphenbutazone   Bufferin plain or Extra Strength Feldene Capsules Oxycodone with Aspirin   Bufferin with Codeine Fenoprofen Fenoprofen Pabalate or Pabalate-SF   Buffets II Flogesic Panagesic   Buffinol plain or Extra Strength Florinal or Florinal with Codeine Panwarfarin   Buf-Tabs Flurbiprofen Penicillamine   Butalbital Compound Four-way cold tablets Penicillin   Butazolidin Fragmin Pepto-Bismol   Carbenicillin Geminisyn Percodan   Carna Arthritis Reliever Geopen Persantine   Carprofen Gold's salt Persistin   Chloramphenicol Goody's Phenylbutazone   Chloromycetin Haltrain Piroxlcam   Clmetidine heparin Plaquenil   Cllnoril Hyco-pap Ponstel   Clofibrate Hydroxy chloroquine Propoxyphen         Before stopping any of these medications, be sure to consult the physician who ordered them.  Some, such as Coumadin (Warfarin) are ordered to prevent or treat serious conditions such as "deep thrombosis", "pumonary embolisms", and other heart problems.  The amount of time that you may need off of the medication may also vary with the medication and the reason for which you were taking it.  If you are taking any of these medications, please make sure  you notify your pain physician before you undergo any procedures.         Intercostal Nerve Block Patient Information  Description: The twelve intercostal nerves arise from the first thru twelfth thoracic nerve roots.  The nerve begins at the spine and wraps around the body, lying in a groove underneath each rib.  Each intercostal nerve innervates a specific strip of skin and body walk of the abdomen and chest.  Therefore, injuries of the chest wall or abdominal wall result in pain that is transmitted back to the brian via the intercostal nerves.  Examples of such injuries include rib fractures and incisions for lung and gall bladder surgery.  Occasionally, pain may persist long after an injury or surgical incision secondary to inflammation and irritation of the intercostal  nerve.  The longstanding pain is known as intercostal neuralgia.  An intercostal nerve block is preformed to eliminate pain either temporarily or permanently.  A small needle is placed below the rib and local anesthetic (like Novocaine) and possibly steroid is injected.  Usually 2-4 intercostal nerves are blocked at a time depending on the problem.  The patient will experience a slight "pin-prick" sensation for each injection.  Shortly thereafter, the strip of skin that is innervated by the blocked intercostal nerve will feel numb.  Persistent pain that is only temporarily relieved with local anesthetic may require a more permanent block. This procedure is called Cryoneurolysis and entails placing a small probe beneath the rib to freeze the nerve.  Conditions that may be treated by intercostal nerve blocks:   Rib fractures  Longstanding pain from surgery of the chest or abdomen (intercostal neuralgia)  Pain from chest tubes  Pain from trauma to the chest  Preparation for the injections:  1. Do not eat any solid food or dairy products within 6 hours of your appointment. 2. You may drink clear liquids up to 2 hours  before appointment.  Clear liquids include water, black coffee, juice or soda.  No milk or cream please. 3. You may take your regular medication, including pain medications, with a sip of water before your appointment.  Diabetics should hold regular insulin (if take separately) and take 1/2 normal NPH dose the morning of the procedure.   Carry some sugar containing items with you to your appointment. 4. A driver must accompany you and be prepared to drive you home after your procedure. 5. Bring all your current medications with you. 6. An IV may be inserted and sedation may be given at the discretion of the physician. 7. A blood pressure cuff, EKG and other monitors will often be applied during the procedure.  Some patients may need to have extra oxygen administered for a short period. 8. You will be asked to provide medical information, including your allergies, prior to the procedure.  We must know immediately if you are taking blood thinners (like Coumadin/Warfarin) or if you are allergic to IV iodine contrast (dye). We must know if you could possible be pregnant.  Possible side-effects:   Bleeding from needle site  Infection (rare)  Nerve injury (rare)  Numbness & tingling of skin  Collapsed lung requiring chest tube (rare)  Local anesthetic toxicity (rare)  Light-headedness (temporary)  Pain at injection site (several days)  Decreased blood pressure (temporary)  Shortness of breath  Jittery/shaking sensation (temporary)  Call if you experience:   Difficulty breathing or hives (go directly to the emergency room)  Redness, inflammation or drainage at the injection site  Severe pain at the site of the injection  Any new symptoms which are concerning   Please note:  Your pain may subside immediately but may return several hours after the injection.  Often, more than one injection is required to reduce the pain. Also, if several temporary blocks with local anesthetic are  ineffective, a more permanent block with cryolysis may be necessary.  This will be discussed with you should this be the case.  If you have any questions, please call [phone] [facility]      F/U PCP for evaliation of  BP and general medical  condition.  Rheumatological evaluation for evaluation of fibromyalgia and recommendations regarding treatment from rheumatological point of view  Psych evaluation for evaluation regarding UDS results and for  recommendations regarding medications helpful for treating your chronic pain  F/U surgical evaluation.  F/U neurological evaluation.  May consider radiofrequency rhizolysis or intraspinal procedures pending response to present treatment and F/U evaluation.  Patient to call [facility] should patient have concerns prior to scheduled return appointment.

## 2014-11-29 NOTE — Progress Notes (Signed)
Safety precautions to be maintained throughout the outpatient stay will include: orient to surroundings, keep bed in low position, maintain call bell within reach at all times, provide assistance with transfer out of bed and ambulation.  

## 2014-11-29 NOTE — Progress Notes (Signed)
Dr Primus Bravo notified of UDS results.

## 2014-11-29 NOTE — Progress Notes (Signed)
   Subjective:    Patient ID: Sharon Welch, female    DOB: [birth year], 44 y.o.   MRN: [id number]  HPI    Review of Systems     Objective:   Physical Exam        Assessment & Plan:

## 2014-11-29 NOTE — Progress Notes (Signed)
Discharge patient home  At 1217 hrs Scripts given for oxycodone and valium Teach back 3 done Instructions given for procedure.

## 2014-11-29 NOTE — Progress Notes (Signed)
   Subjective:    Patient ID: Sharon Welch, female    DOB: August 06, 1970, 44 y.o.   MRN: [id number]  HPI  Patient with 40-year-old female returns to pain management Center for further evaluation and treatment of pain involving the neck and entire back upper and lower extremity regions. Patient with history of headaches will undergo further evaluation of headaches this week evaluation to be performed by neurologist. Patient is with significant muscle spasms occurring throughout the cervical thoracic and lumbar regions. Patient with severe spasms of the mid back region with proceed with intercostal nerve blocks at time return appointment in attempt to decrease severity of patient's symptoms and minimize progression of patient's symptoms and hopefully avoid the need for more involved treatment. We will consider patient for further evaluation and treatment pending response to present treatment regimen and pending follow-up evaluation in [facility] patient is understanding and agrees just plan. She will intercostal nerve blocks at time return appointment as discussed patient undergoing evaluation of headaches by neurologist is plan      Review of Systems     Objective:   Physical Exam  Tennis palpationAnd occipitalis region of moderate degree on the left as well as on the right. There were no new lesions of the head and neck noted. There was tends to palpation of the acromioclavicular glenohumeral joint region on the left greater than right with limited range of motion of the left shoulder compared to the right shoulder. There appeared to be slightly decreased grip strength. Tinel and Phalen's maneuver without increased pain of any significant degree. Palpation of the thoracic facet thoracic paraspinal musculature region was associated with severe muscle spasms occurring on the left as well as on the right. Without crepitus of the thoracic region noted. There was tends to palpation of the lumbar  paraspinal muscular musculatures and lumbar facet region a moderate degree as well with decreased straight leg raising tolerates approximately 20 and decreased EHL strength. Knees were with tenderness to palpation in crepitus of the knees. Negative anterior and posterior drawer signs noted. No ballottement of the patella was noted. No milk formed lesions skin of the knees noted. There was tennis of the greater trochanteric region iliotibial band region. Increased pain with palpation of the PSIS and PII S region of moderate degree. The nontender with no costovertebral maintenance noted.      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  F/U PCP for evaliation of  BP and general medical  condition. Patient will discuss undergoing rheumatological evaluation with primary care physician as well  F/U surgical evaluation.  F/U neurological evaluation. Neurological evaluation of headaches this week as planned  May consider radiofrequency rhizolysis or intraspinal procedures pending response to present treatment and F/U evaluation.  Patient to call [facility] Management Center should patient have concerns prior to scheduled return appointment.

## 2014-11-30 DIAGNOSIS — M542 Cervicalgia: Secondary | ICD-10-CM | POA: Diagnosis not present

## 2014-11-30 DIAGNOSIS — Z79899 Other long term (current) drug therapy: Secondary | ICD-10-CM | POA: Diagnosis not present

## 2014-11-30 DIAGNOSIS — M5481 Occipital neuralgia: Secondary | ICD-10-CM | POA: Diagnosis not present

## 2014-11-30 DIAGNOSIS — G4440 Drug-induced headache, not elsewhere classified, not intractable: Secondary | ICD-10-CM | POA: Diagnosis not present

## 2014-11-30 DIAGNOSIS — Z79891 Long term (current) use of opiate analgesic: Secondary | ICD-10-CM | POA: Diagnosis not present

## 2014-11-30 DIAGNOSIS — G43709 Chronic migraine without aura, not intractable, without status migrainosus: Secondary | ICD-10-CM | POA: Diagnosis not present

## 2014-12-07 ENCOUNTER — Ambulatory Visit: Payer: Medicare Other | Attending: Pain Medicine | Admitting: Pain Medicine

## 2014-12-07 ENCOUNTER — Encounter: Payer: Self-pay | Admitting: Pain Medicine

## 2014-12-07 VITALS — BP 139/63 | HR 62 | Temp 97.2°F | Resp 16 | Ht 61.0 in | Wt 95.0 lb

## 2014-12-07 DIAGNOSIS — M5134 Other intervertebral disc degeneration, thoracic region: Secondary | ICD-10-CM

## 2014-12-07 DIAGNOSIS — M5030 Other cervical disc degeneration, unspecified cervical region: Secondary | ICD-10-CM

## 2014-12-07 DIAGNOSIS — G588 Other specified mononeuropathies: Secondary | ICD-10-CM

## 2014-12-07 DIAGNOSIS — M542 Cervicalgia: Secondary | ICD-10-CM | POA: Insufficient documentation

## 2014-12-07 DIAGNOSIS — M5137 Other intervertebral disc degeneration, lumbosacral region: Secondary | ICD-10-CM

## 2014-12-07 DIAGNOSIS — R0782 Intercostal pain: Secondary | ICD-10-CM | POA: Diagnosis not present

## 2014-12-07 MED ORDER — BUPIVACAINE HCL (PF) 0.25 % IJ SOLN
INTRAMUSCULAR | Status: AC
  Administered 2014-12-07: 30 mL
  Filled 2014-12-07: qty 30

## 2014-12-07 MED ORDER — ORPHENADRINE CITRATE 30 MG/ML IJ SOLN
INTRAMUSCULAR | Status: AC
Start: 2014-12-07 — End: 2014-12-07
  Administered 2014-12-07: 60 mg
  Filled 2014-12-07: qty 2

## 2014-12-07 MED ORDER — TRIAMCINOLONE ACETONIDE 40 MG/ML IJ SUSP
INTRAMUSCULAR | Status: AC
Start: 2014-12-07 — End: 2014-12-07
  Administered 2014-12-07: 40 mg
  Filled 2014-12-07: qty 1

## 2014-12-07 MED ORDER — FENTANYL CITRATE (PF) 100 MCG/2ML IJ SOLN
INTRAMUSCULAR | Status: AC
  Administered 2014-12-07: 100 ug via INTRAVENOUS
  Filled 2014-12-07: qty 2

## 2014-12-07 MED ORDER — MIDAZOLAM HCL 5 MG/5ML IJ SOLN
INTRAMUSCULAR | Status: AC
  Administered 2014-12-07: 5 mg via INTRAVENOUS
  Filled 2014-12-07: qty 5

## 2014-12-07 NOTE — Patient Instructions (Addendum)
Smoking Cessation Quitting smoking is important to your health and has many advantages. However, it is not always easy to quit since nicotine is a very addictive drug. Oftentimes, people try 3 times or more before being able to quit. This document explains the best ways for you to prepare to quit smoking. Quitting takes hard work and a lot of effort, but you can do it. ADVANTAGES OF QUITTING SMOKING  You will live longer, feel better, and live better.  Your body will feel the impact of quitting smoking almost immediately.  Within 20 minutes, blood pressure decreases. Your pulse returns to its normal level.  After 8 hours, carbon monoxide levels in the blood return to normal. Your oxygen level increases.  After 24 hours, the chance of having a heart attack starts to decrease. Your breath, hair, and body stop smelling like smoke.  After 48 hours, damaged nerve endings begin to recover. Your sense of taste and smell improve.  After 72 hours, the body is virtually free of nicotine. Your bronchial tubes relax and breathing becomes easier.  After 2 to 12 weeks, lungs can hold more air. Exercise becomes easier and circulation improves.  The risk of having a heart attack, stroke, cancer, or lung disease is greatly reduced.  After 1 year, the risk of coronary heart disease is cut in half.  After 5 years, the risk of stroke falls to the same as a nonsmoker.  After 10 years, the risk of lung cancer is cut in half and the risk of other cancers decreases significantly.  After 15 years, the risk of coronary heart disease drops, usually to the level of a nonsmoker.  If you are pregnant, quitting smoking will improve your chances of having a healthy baby.  The people you live with, especially any children, will be healthier.  You will have extra money to spend on things other than cigarettes. QUESTIONS TO THINK ABOUT BEFORE ATTEMPTING TO QUIT You may want to talk about your answers with your  health care provider.  Why do you want to quit?  If you tried to quit in the past, what helped and what did not?  What will be the most difficult situations for you after you quit? How will you plan to handle them?  Who can help you through the tough times? Your family? Friends? A health care provider?  What pleasures do you get from smoking? What ways can you still get pleasure if you quit? Here are some questions to ask your health care provider:  How can you help me to be successful at quitting?  What medicine do you think would be best for me and how should I take it?  What should I do if I need more help?  What is smoking withdrawal like? How can I get information on withdrawal? GET READY  Set a quit date.  Change your environment by getting rid of all cigarettes, ashtrays, matches, and lighters in your home, car, or work. Do not let people smoke in your home.  Review your past attempts to quit. Think about what worked and what did not. GET SUPPORT AND ENCOURAGEMENT You have a better chance of being successful if you have help. You can get support in many ways.  Tell your family, friends, and coworkers that you are going to quit and need their support. Ask them not to smoke around you.  Get individual, group, or telephone counseling and support. Programs are available at local hospitals and health centers. Call   your local health department for information about programs in your area.  Spiritual beliefs and practices may help some smokers quit.  Download a "quit meter" on your computer to keep track of quit statistics, such as how long you have gone without smoking, cigarettes not smoked, and money saved.  Get a self-help book about quitting smoking and staying off tobacco. [place] yourself from urges to smoke. Talk to someone, go for a walk, or occupy your time with a task.  Change your normal routine. Take a different route to work.  Drink tea instead of coffee. Eat breakfast in a different place.  Reduce your stress. Take a hot bath, exercise, or read a book.  Plan something enjoyable to do every day. Reward yourself for not smoking.  Explore interactive web-based programs that specialize in helping you quit. GET MEDICINE AND USE IT CORRECTLY Medicines can help you stop smoking and decrease the urge to smoke. Combining medicine with the above behavioral methods and support can greatly increase your chances of successfully quitting smoking.  Nicotine replacement therapy helps deliver nicotine to your body without the negative effects and risks of smoking. Nicotine replacement therapy includes nicotine gum, lozenges, inhalers, nasal sprays, and skin patches. Some may be available over-the-counter and others require a prescription.  Antidepressant medicine helps people abstain from smoking, but how this works is unknown. This medicine is available by prescription.  Nicotinic receptor partial agonist medicine simulates the effect of nicotine in your brain. This medicine is available by prescription. Ask your health care provider for advice about which medicines to use and how to use them based on your health history. Your health care provider will tell you what side effects to look out for if you choose to be on a medicine or therapy. Carefully read the information on the package. Do not use any other product containing nicotine while using a nicotine replacement product.  RELAPSE OR DIFFICULT SITUATIONS Most relapses occur within the first 3 months after quitting. Do not be discouraged if you start smoking again. Remember, most people try several times before finally quitting. You may have symptoms of withdrawal because your body is used to nicotine. You may crave cigarettes, be irritable, feel very hungry, cough often, get headaches, or have difficulty concentrating. The withdrawal symptoms are only temporary. They are strongest  when you first quit, but they will go away within 10-14 days. To reduce the chances of relapse, try to:  Avoid drinking alcohol. Drinking lowers your chances of successfully quitting.  Reduce the amount of caffeine you consume. Once you quit smoking, the amount of caffeine in your body increases and can give you symptoms, such as a rapid heartbeat, sweating, and anxiety.  Avoid smokers because they can make you want to smoke.  Do not let weight gain distract you. Many smokers will gain weight when they quit, usually less than 10 pounds. Eat a healthy diet and stay active. You can always lose the weight gained after you quit.  Find ways to improve your mood other than smoking. FOR MORE INFORMATION  www.smokefree.gov  Document Released: 06/04/2001 Document Revised: 10/25/2013 Document Reviewed: 09/19/2011 [facility] Patient Information 2015 [place], [place]. This information is not intended to replace advice given to you by your health care provider. Make sure you discuss any questions you have with your health care provider.  Continue present medications.  F/U PCP for evaliation of  BP and general medical  condition.  F/U surgical evaluation.  F/U neurological evaluation.  May consider radiofrequency rhizolysis or intraspinal procedures pending response to present treatment and F/U evaluation.  Patient to call [facility] Center should patient have concerns prior to scheduled return appointment.    Pain Management Discharge Instructions  General Discharge Instructions :  If you need to reach your doctor call: Monday-Friday 8:00 am - 4:00 pm at [phone] or toll free [phone].  After clinic hours [phone] to have operator reach doctor.  Bring all of your medication bottles to all your appointments in the pain clinic.  To cancel or reschedule your appointment with Pain Management please remember to call 24 hours in advance to avoid a fee.  Refer to the educational  materials which you have been given on: General Risks, I had my Procedure. Discharge Instructions, Post Sedation.  Post Procedure Instructions:  The drugs you were given will stay in your system until tomorrow, so for the next 24 hours you should not drive, make any legal decisions or drink any alcoholic beverages.  You may eat anything you prefer, but it is better to start with liquids then soups and crackers, and gradually work up to solid foods.  Please notify your doctor immediately if you have any unusual bleeding, trouble breathing or pain that is not related to your normal pain.  Depending on the type of procedure that was done, some parts of your body may feel week and/or numb.  This usually clears up by tonight or the next day.  Walk with the use of an assistive device or accompanied by an adult for the 24 hours.  You may use ice on the affected area for the first 24 hours.  Put ice in a Ziploc bag and cover with a towel and place against area 15 minutes on 15 minutes off.  You may switch to heat after 24 hours.

## 2014-12-07 NOTE — Progress Notes (Signed)
   Subjective:    Patient ID: Sharon Welch, female    DOB: 11-Jan-1971, 44 y.o.   MRN: [id number]  HPI   PROCEDURE PERFORMED:  Intercostal nerve block.  HISTORY OF PRESENT ILLNESS:  The patient is a 44 y.o. female who returns to the [facility] for further evaluation and treatment of pain involving the midportion of the back. 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 The risks, benefits, and expectations of the procedure have been discussed and explained to the patient who was understanding and in agreement with suggested treatment plan. We will proceed with interventional treatment as discussed.   DESCRIPTION OF PROCEDURE: Intercostal nerve block with IV Versed, IV fentanyl conscious sedation, EKG, blood pressure, pulse, and pulse oximetry monitoring. The procedure was performed with the patient in the prone position under fluoroscopic guidance.   Intercostal nerve block, left side: With the patient in the prone position, Betadine prep of proposed entry site was performed under fluoroscopic guidance with AP view of the thoracic spine. Under fluoroscopic guidance, a 22 -gauge needle was inserted to contact bone of the 11th rib on the left side after which the needle was repositioned at the inferior border of the 11th rib on the left side under fluoroscopic guidance. Following documentation of needle placement, at the inferior border of the 11th rib on the left side and negative aspiration, a total of 3 mL of 0.25% bupivacaine with Kenalog was injected for left side for 11th rib intercostal nerve block.   INTERCOSTAL NERVE BLOCKS AT T 10, T9, T8, T7, and T6 INTERCOSTAL LEVELS: The procedure was performed  as was performed at the previous level and under fluoroscopic guidance  A total of 10 mg Kenalog was utilized for the procedure.   PLAN:   1. Medications: We will continue presently prescribed medications. 2. The patient  is to follow up with primary care physician for further evaluation of blood pressure and general medical condition as discussed. 3. Surgical evaluation.  4. Neurological evaluation. 5. May consider the patient for additional studies pending response to treatment and follow-up evaluation. 6. May consider radiofrequency procedures, implantation type procedures and other treatment pending response to treatment and follow-up evaluation. 7. The patient has been advised to adhere to proper body mechanics and to call the Pain Management Center prior to scheduled return appointment should there be significant change in condition or have other concerns regarding condition prior to scheduled return appointment.  The patient is understanding and in agreement with suggested treatment plan.    Review of Systems     Objective:   Physical Exam        Assessment & Plan:

## 2014-12-07 NOTE — Progress Notes (Signed)
Safety precautions to be maintained throughout the outpatient stay will include: orient to surroundings, keep bed in low position, maintain call bell within reach at all times, provide assistance with transfer out of bed and ambulation. Tolerating po fluids well.

## 2014-12-08 ENCOUNTER — Telehealth: Payer: Self-pay | Admitting: *Deleted

## 2014-12-08 NOTE — Telephone Encounter (Signed)
Patient denies any complications post procedure 

## 2014-12-29 ENCOUNTER — Encounter: Payer: Medicare Other | Admitting: Pain Medicine

## 2015-01-02 ENCOUNTER — Ambulatory Visit: Payer: Medicare Other | Attending: Pain Medicine | Admitting: Pain Medicine

## 2015-01-02 VITALS — BP 119/62 | HR 106 | Temp 97.8°F | Resp 16 | Ht 61.0 in | Wt 105.0 lb

## 2015-01-02 DIAGNOSIS — M79605 Pain in left leg: Secondary | ICD-10-CM | POA: Diagnosis present

## 2015-01-02 DIAGNOSIS — G588 Other specified mononeuropathies: Secondary | ICD-10-CM

## 2015-01-02 DIAGNOSIS — Z9889 Other specified postprocedural states: Secondary | ICD-10-CM | POA: Insufficient documentation

## 2015-01-02 DIAGNOSIS — M19019 Primary osteoarthritis, unspecified shoulder: Secondary | ICD-10-CM | POA: Insufficient documentation

## 2015-01-02 DIAGNOSIS — M5136 Other intervertebral disc degeneration, lumbar region: Secondary | ICD-10-CM | POA: Insufficient documentation

## 2015-01-02 DIAGNOSIS — G43109 Migraine with aura, not intractable, without status migrainosus: Secondary | ICD-10-CM

## 2015-01-02 DIAGNOSIS — M5481 Occipital neuralgia: Secondary | ICD-10-CM | POA: Insufficient documentation

## 2015-01-02 DIAGNOSIS — M461 Sacroiliitis, not elsewhere classified: Secondary | ICD-10-CM | POA: Diagnosis not present

## 2015-01-02 DIAGNOSIS — M5416 Radiculopathy, lumbar region: Secondary | ICD-10-CM | POA: Diagnosis not present

## 2015-01-02 DIAGNOSIS — M546 Pain in thoracic spine: Secondary | ICD-10-CM | POA: Diagnosis present

## 2015-01-02 DIAGNOSIS — R0782 Intercostal pain: Secondary | ICD-10-CM | POA: Diagnosis not present

## 2015-01-02 DIAGNOSIS — M47817 Spondylosis without myelopathy or radiculopathy, lumbosacral region: Secondary | ICD-10-CM | POA: Diagnosis not present

## 2015-01-02 DIAGNOSIS — M5137 Other intervertebral disc degeneration, lumbosacral region: Secondary | ICD-10-CM

## 2015-01-02 DIAGNOSIS — M5134 Other intervertebral disc degeneration, thoracic region: Secondary | ICD-10-CM

## 2015-01-02 DIAGNOSIS — M170 Bilateral primary osteoarthritis of knee: Secondary | ICD-10-CM | POA: Insufficient documentation

## 2015-01-02 DIAGNOSIS — M5030 Other cervical disc degeneration, unspecified cervical region: Secondary | ICD-10-CM

## 2015-01-02 DIAGNOSIS — G43909 Migraine, unspecified, not intractable, without status migrainosus: Secondary | ICD-10-CM | POA: Insufficient documentation

## 2015-01-02 DIAGNOSIS — M542 Cervicalgia: Secondary | ICD-10-CM | POA: Diagnosis present

## 2015-01-02 DIAGNOSIS — M419 Scoliosis, unspecified: Secondary | ICD-10-CM | POA: Diagnosis not present

## 2015-01-02 MED ORDER — OXYCODONE HCL 5 MG PO TABS: ORAL_TABLET | ORAL | Status: DC

## 2015-01-02 MED ORDER — DIAZEPAM 5 MG PO TABS: ORAL_TABLET | ORAL | Status: DC

## 2015-01-02 MED ORDER — AMITRIPTYLINE HCL 10 MG PO TABS: ORAL_TABLET | ORAL | Status: DC

## 2015-01-02 NOTE — Patient Instructions (Addendum)
Continue present medications  F/U PCP for evaliation of  BP and general medical  condition.  F/U surgical evaluation  F/U neurological evaluation  May consider radiofrequency rhizolysis or intraspinal procedures pending response to present treatment and F/U evaluation.  Patient to call [facility] should patient have concerns prior to scheduled return appointment. Intercostal Nerve Block Patient Information  Description: The twelve intercostal nerves arise from the first thru twelfth thoracic nerve roots.  The nerve begins at the spine and wraps around the body, lying in a groove underneath each rib.  Each intercostal nerve innervates a specific strip of skin and body walk of the abdomen and chest.  Therefore, injuries of the chest wall or abdominal wall result in pain that is transmitted back to the brian via the intercostal nerves.  Examples of such injuries include rib fractures and incisions for lung and gall bladder surgery.  Occasionally, pain may persist long after an injury or surgical incision secondary to inflammation and irritation of the intercostal nerve.  The longstanding pain is known as intercostal neuralgia.  An intercostal nerve block is preformed to eliminate pain either temporarily or permanently.  A small needle is placed below the rib and local anesthetic (like Novocaine) and possibly steroid is injected.  Usually 2-4 intercostal nerves are blocked at a time depending on the problem.  The patient will experience a slight "pin-prick" sensation for each injection.  Shortly thereafter, the strip of skin that is innervated by the blocked intercostal nerve will feel numb.  Persistent pain that is only temporarily relieved with local anesthetic may require a more permanent block. This procedure is called Cryoneurolysis and entails placing a small probe beneath the rib to freeze the nerve.  Conditions that may be treated by intercostal nerve blocks:   Rib  fractures  Longstanding pain from surgery of the chest or abdomen (intercostal neuralgia)  Pain from chest tubes  Pain from trauma to the chest  Preparation for the injections:  1. Do not eat any solid food or dairy products within 6 hours of your appointment. 2. You may drink clear liquids up to 2 hours before appointment.  Clear liquids include water, black coffee, juice or soda.  No milk or cream please. 3. You may take your regular medication, including pain medications, with a sip of water before your appointment.  Diabetics should hold regular insulin (if take separately) and take 1/2 normal NPH dose the morning of the procedure.   Carry some sugar containing items with you to your appointment. 4. A driver must accompany you and be prepared to drive you home after your procedure. 5. Bring all your current medications with you. 6. An IV may be inserted and sedation may be given at the discretion of the physician. 7. A blood pressure cuff, EKG and other monitors will often be applied during the procedure.  Some patients may need to have extra oxygen administered for a short period. 8. You will be asked to provide medical information, including your allergies, prior to the procedure.  We must know immediately if you are taking blood thinners (like Coumadin/Warfarin) or if you are allergic to IV iodine contrast (dye). We must know if you could possible be pregnant.  Possible side-effects:   Bleeding from needle site  Infection (rare)  Nerve injury (rare)  Numbness & tingling of skin  Collapsed lung requiring chest tube (rare)  Local anesthetic toxicity (rare)  Light-headedness (temporary)  Pain at injection site (several days)  Decreased blood  pressure (temporary)  Shortness of breath  Jittery/shaking sensation (temporary)  Call if you experience:   Difficulty breathing or hives (go directly to the emergency room)  Redness, inflammation or drainage at the injection  site  Severe pain at the site of the injection  Any new symptoms which are concerning   Please note:  Your pain may subside immediately but may return several hours after the injection.  Often, more than one injection is required to reduce the pain. Also, if several temporary blocks with local anesthetic are ineffective, a more permanent block with cryolysis may be necessary.  This will be discussed with you should this be the case.  If you have any questions, please call [phone] [place]  What are the risk, side effects and possible complications? Generally speaking, most procedures are safe.  However, with any procedure there are risks, side effects, and the possibility of complications.  The risks and complications are dependent upon the sites that are lesioned, or the type of nerve block to be performed.  The closer the procedure is to the spine, the more serious the risks are.  Great care is taken when placing the radio frequency needles, block needles or lesioning probes, but sometimes complications can occur. 1. Infection: Any time there is an injection through the skin, there is a risk of infection.  This is why sterile conditions are used for these blocks.  There are four possible types of infection. 1. Localized skin infection. 2. Central Nervous System Infection-This can be in the form of Meningitis, which can be deadly. 3. Epidural Infections-This can be in the form of an epidural abscess, which can cause pressure inside of the spine, causing compression of the spinal cord with subsequent paralysis. This would require an emergency surgery to decompress, and there are no guarantees that the patient would recover from the paralysis. 4. Discitis-This is an infection of the intervertebral discs.  It occurs in about 1% of discography procedures.  It is difficult to treat and it may lead to surgery.         2. Pain: the needles have to go through skin and soft tissues, will cause soreness.       3. Damage to internal structures:  The nerves to be lesioned may be near blood vessels or    other nerves which can be potentially damaged.       4. Bleeding: Bleeding is more common if the patient is taking blood thinners such as  aspirin, Coumadin, Ticiid, Plavix, etc., or if he/she have some genetic predisposition  such as hemophilia. Bleeding into the spinal canal can cause compression of the spinal  cord with subsequent paralysis.  This would require an emergency surgery to  decompress and there are no guarantees that the patient would recover from the  paralysis.       5. Pneumothorax:  Puncturing of a lung is a possibility, every time a needle is introduced in  the area of the chest or upper back.  Pneumothorax refers to free air around the  collapsed lung(s), inside of the thoracic cavity (chest cavity).  Another two possible  complications related to a similar event would include: Hemothorax and Chylothorax.   These are variations of the Pneumothorax, where instead of air around the collapsed  lung(s), you may have blood or chyle, respectively.       6. Spinal headaches: They may occur with any  procedures in the area of the spine.       7. Persistent CSF (Cerebro-Spinal Fluid) leakage: This is a rare problem, but may occur  with prolonged intrathecal or epidural catheters either due to the formation of a fistulous  track or a dural tear.       8. Nerve damage: By working so close to the spinal cord, there is always a possibility of  nerve damage, which could be as serious as a permanent spinal cord injury with  paralysis.       9. Death:  Although rare, severe deadly allergic reactions known as "Anaphylactic  reaction" can occur to any of the medications used.      10. Worsening of the symptoms:  We can always make thing worse.  What are the chances of something like this happening? Chances of any of this  occuring are extremely low.  By statistics, you have more of a chance of getting killed in a motor vehicle accident: while driving to the hospital than any of the above occurring .  Nevertheless, you should be aware that they are possibilities.  In general, it is similar to taking a shower.  Everybody knows that you can slip, hit your head and get killed.  Does that mean that you should not shower again?  Nevertheless always keep in mind that statistics do not mean anything if you happen to be on the wrong side of them.  Even if a procedure has a 1 (one) in a 1,000,000 (million) chance of going wrong, it you happen to be that one..Also, keep in mind that by statistics, you have more of a chance of having something go wrong when taking medications.  Who should not have this procedure? If you are on a blood thinning medication (e.g. Coumadin, Plavix, see list of "Blood Thinners"), or if you have an active infection going on, you should not have the procedure.  If you are taking any blood thinners, please inform your physician.  How should I prepare for this procedure?  Do not eat or drink anything at least six hours prior to the procedure.  Bring a driver with you .  It cannot be a taxi.  Come accompanied by an adult that can drive you back, and that is strong enough to help you if your legs get weak or numb from the local anesthetic.  Take all of your medicines the morning of the procedure with just enough water to swallow them.  If you have diabetes, make sure that you are scheduled to have your procedure done first thing in the morning, whenever possible.  If you have diabetes, take only half of your insulin dose and notify our nurse that you have done so as soon as you arrive at the clinic.  If you are diabetic, but only take blood sugar pills (oral hypoglycemic), then do not take them on the morning of your procedure.  You may take them after you have had the procedure.  Do not take aspirin  or any aspirin-containing medications, at least eleven (11) days prior to the procedure.  They may prolong bleeding.  Wear loose fitting clothing that may be easy to take off and that you would not mind if it got stained with Betadine or blood.  Do not wear any jewelry or perfume  Remove any nail coloring.  It will interfere with some of our monitoring equipment.  NOTE: Remember that this is not meant to be interpreted as a complete  list of all possible complications.  Unforeseen problems may occur.  BLOOD THINNERS The following drugs contain aspirin or other products, which can cause increased bleeding during surgery and should not be taken for 2 weeks prior to and 1 week after surgery.  If you should need take something for relief of minor pain, you may take acetaminophen which is found in Tylenol,m Datril, Anacin-3 and Panadol. It is not blood thinner. The products listed below are.  Do not take any of the products listed below in addition to any listed on your instruction sheet.  A.P.C or A.P.C with Codeine Codeine Phosphate Capsules #3 Ibuprofen Ridaura  ABC compound Congesprin Imuran rimadil  Advil Cope Indocin Robaxisal  Alka-Seltzer Effervescent Pain Reliever and Antacid Coricidin or Coricidin-D  Indomethacin Rufen  Alka-Seltzer plus Cold Medicine Cosprin Ketoprofen S-A-C Tablets  Anacin Analgesic Tablets or Capsules Coumadin Korlgesic Salflex  Anacin Extra Strength Analgesic tablets or capsules CP-2 Tablets Lanoril Salicylate  Anaprox Cuprimine Capsules Levenox Salocol  Anexsia-D Dalteparin Magan Salsalate  Anodynos Darvon compound Magnesium Salicylate Sine-off  Ansaid Dasin Capsules Magsal Sodium Salicylate  Anturane Depen Capsules Marnal Soma  APF Arthritis pain formula Dewitt's Pills Measurin Stanback  Argesic Dia-Gesic Meclofenamic Sulfinpyrazone  Arthritis Bayer Timed Release Aspirin Diclofenac Meclomen Sulindac  Arthritis pain formula Anacin Dicumarol Medipren Supac   Analgesic (Safety coated) Arthralgen Diffunasal Mefanamic Suprofen  Arthritis Strength Bufferin Dihydrocodeine Mepro Compound Suprol  Arthropan liquid Dopirydamole Methcarbomol with Aspirin Synalgos  ASA tablets/Enseals Disalcid Micrainin Tagament  Ascriptin Doan's Midol Talwin  Ascriptin A/D Dolene Mobidin Tanderil  Ascriptin Extra Strength Dolobid Moblgesic Ticlid  Ascriptin with Codeine Doloprin or Doloprin with Codeine Momentum Tolectin  Asperbuf Duoprin Mono-gesic Trendar  Aspergum Duradyne Motrin or Motrin IB Triminicin  Aspirin plain, buffered or enteric coated Durasal Myochrisine Trigesic  Aspirin Suppositories Easprin Nalfon Trillsate  Aspirin with Codeine Ecotrin Regular or Extra Strength Naprosyn Uracel  Atromid-S Efficin Naproxen Ursinus  Auranofin Capsules Elmiron Neocylate Vanquish  Axotal Emagrin Norgesic Verin  Azathioprine Empirin or Empirin with Codeine Normiflo Vitamin E  Azolid Emprazil Nuprin Voltaren  Bayer Aspirin plain, buffered or children's or timed BC Tablets or powders Encaprin Orgaran Warfarin Sodium  Buff-a-Comp Enoxaparin Orudis Zorpin  Buff-a-Comp with Codeine Equegesic Os-Cal-Gesic   Buffaprin Excedrin plain, buffered or Extra Strength Oxalid   Bufferin Arthritis Strength Feldene Oxphenbutazone   Bufferin plain or Extra Strength Feldene Capsules Oxycodone with Aspirin   Bufferin with Codeine Fenoprofen Fenoprofen Pabalate or Pabalate-SF   Buffets II Flogesic Panagesic   Buffinol plain or Extra Strength Florinal or Florinal with Codeine Panwarfarin   Buf-Tabs Flurbiprofen Penicillamine   Butalbital Compound Four-way cold tablets Penicillin   Butazolidin Fragmin Pepto-Bismol   Carbenicillin Geminisyn Percodan   Carna Arthritis Reliever Geopen Persantine   Carprofen Gold's salt Persistin   Chloramphenicol Goody's Phenylbutazone   Chloromycetin Haltrain Piroxlcam   Clmetidine heparin Plaquenil   Cllnoril Hyco-pap Ponstel   Clofibrate Hydroxy  chloroquine Propoxyphen         Before stopping any of these medications, be sure to consult the physician who ordered them.  Some, such as Coumadin (Warfarin) are ordered to prevent or treat serious conditions such as "deep thrombosis", "pumonary embolisms", and other heart problems.  The amount of time that you may need off of the medication may also vary with the medication and the reason for which you were taking it.  If you are taking any of these medications, please make sure you notify your pain physician before you undergo  any procedures.       Continue present medications Elavil oxycodone and Valium  Intercostal nerve blocks to be performed Wednesday, 01/18/2015  F/U PCP for evaliation of  BP and general medical  condition.  F/U surgical evaluation  F/U neurological evaluation for follow-up evaluation of headaches as discussed  May consider radiofrequency rhizolysis or intraspinal procedures pending response to present treatment and F/U evaluation.  Patient to call [facility] should patient have concerns prior to scheduled return appointment.

## 2015-01-02 NOTE — Progress Notes (Signed)
   Subjective:    Patient ID: Nelta Numbers, female    DOB: 05/11/1971, 44 y.o.   MRN: [id number]  HPI patient is 44 year old female who returns to [facility] for further evaluation and treatment of pain involving the neck entire back upper and lower extremity regions. Patient also with history of headaches. At the present time patient states most severe pain involves the upper mid and lower portions of the back. Patient is status post fusion of her Harrington rods for scoliosis of the thoracic or lumbar spine. Patient was severe spasms occurring in the thoracic lumbar region at this time which also appear to be contributing to patient's head headaches which he been felt to be both migraine headaches as well as greater occipital neuralgia myofascial pain related headaches. We will proceed with intercostal nerve blocks at time return appointment in attempt to decrease severe spasms of the thoracic area as well as decrease severity of patient's headaches. Patient was understanding and agrees with suggested treatment plan. We will continue medications consisting of Elavil Valium and oxycodone at this time. The patient was in agreement with suggested treatment plan     Review of Systems     Objective:   Physical Exam  There was tends to palpation of paraspinal musculature region cervical region cervical facet region outpatient of which reproduced moderate degree. There was moderate to moderately severe tends to palpation over the thoracic facet thoracic paraspinal musculature region. Patient of the splenius capitis and occipitalis musculature region reproduces moderately severe discomfort as well. Patient of the acromioclavicular glenohumeral joint region was with moderate discomfort. Patient appeared to be with decreased grip strength. Left side weaker than the right side. Tinel and Phalen's maneuver were without increase of pain of significant degree. Palpation over the thoracic paraspinal  musculature region was without crepitus of the thoracic region noted. Palpation over the lumbar paraspinal musculature region lumbar facet region was a tends to palpation with extension and palpation over the lumbar facets reproducing moderately severe discomfort. There was tenderness over the PSIS and PII S region a moderate to moderately severe degree as well. Leg raising was tolerated to approximately 20 without increased pain with dorsiflexion noted. There was negative clonus negative Homans. There was tends to palpation of the knees with negative anterior and posterior drawer signs without ballottement of the patella. No sensory deficit of dermatomal distribution of the lower extremities noted. Negative clonus negative Homans. On the greater trochanteric region iliotibial band region. Abdomen nontender no costovertebral maintenance noted.      Assessment & Plan:  Degenerative disc disease thoracic spine Thoracic facet syndrome Status post Harrington rods thoracic and lumbar regions for severe scoliosis of the thoracolumbar spine  Degenerative disc disease lumbar spine Lumbar facet syndrome Status post Harrington rods for severe scoliosis of the thoracic and lumbar regions  Intercostal neuralgia  Greater occipital neuralgia  Cervical facet syndrome  Degenerative joint disease of shoulder  Degenerative joint disease of knees    Plan   Continue present medications Elavil Valium and oxycodone  F/U PCP for evaliation of  BP and general medical  condition.  F/U surgical evaluation status post surgery of the thoracic or lumbar region  F/U neurological evaluation regarding headaches and follow-up Botox injections  F/U psych evaluation as discussed   May consider radiofrequency rhizolysis or intraspinal procedures pending response to present treatment and F/U evaluation.  Patient to call [facility] should patient have concerns prior to scheduled return appointment.

## 2015-01-02 NOTE — Progress Notes (Signed)
Safety precautions to be maintained throughout the outpatient stay will include: orient to surroundings, keep bed in low position, maintain call bell within reach at all times, provide assistance with transfer out of bed and ambulation.  Scripts given for  Oxycodone and valium

## 2015-01-18 ENCOUNTER — Ambulatory Visit: Payer: Medicare Other | Admitting: Pain Medicine

## 2015-01-19 ENCOUNTER — Other Ambulatory Visit: Payer: Self-pay | Admitting: Pain Medicine

## 2015-01-25 ENCOUNTER — Encounter: Payer: Self-pay | Admitting: Pain Medicine

## 2015-01-25 ENCOUNTER — Ambulatory Visit: Payer: Medicare Other | Attending: Pain Medicine | Admitting: Pain Medicine

## 2015-01-25 VITALS — BP 124/69 | HR 70 | Temp 98.2°F | Resp 16 | Ht 61.0 in | Wt 102.0 lb

## 2015-01-25 DIAGNOSIS — M5134 Other intervertebral disc degeneration, thoracic region: Secondary | ICD-10-CM

## 2015-01-25 DIAGNOSIS — M546 Pain in thoracic spine: Secondary | ICD-10-CM | POA: Diagnosis not present

## 2015-01-25 DIAGNOSIS — M4184 Other forms of scoliosis, thoracic region: Secondary | ICD-10-CM | POA: Diagnosis not present

## 2015-01-25 DIAGNOSIS — G43109 Migraine with aura, not intractable, without status migrainosus: Secondary | ICD-10-CM

## 2015-01-25 DIAGNOSIS — M5137 Other intervertebral disc degeneration, lumbosacral region: Secondary | ICD-10-CM

## 2015-01-25 DIAGNOSIS — M5030 Other cervical disc degeneration, unspecified cervical region: Secondary | ICD-10-CM

## 2015-01-25 DIAGNOSIS — Z9889 Other specified postprocedural states: Secondary | ICD-10-CM | POA: Insufficient documentation

## 2015-01-25 DIAGNOSIS — G588 Other specified mononeuropathies: Secondary | ICD-10-CM

## 2015-01-25 DIAGNOSIS — M5481 Occipital neuralgia: Secondary | ICD-10-CM

## 2015-01-25 DIAGNOSIS — R0782 Intercostal pain: Secondary | ICD-10-CM | POA: Diagnosis not present

## 2015-01-25 MED ORDER — CEFAZOLIN SODIUM 1 G IJ SOLR
INTRAMUSCULAR | Status: AC
  Administered 2015-01-25: 1 g via INTRAVENOUS
  Filled 2015-01-25: qty 10

## 2015-01-25 MED ORDER — DIAZEPAM 5 MG PO TABS: ORAL_TABLET | ORAL | Status: DC

## 2015-01-25 MED ORDER — OXYCODONE HCL 5 MG PO TABS: ORAL_TABLET | ORAL | Status: DC

## 2015-01-25 MED ORDER — KETOROLAC TROMETHAMINE 60 MG/2ML IM SOLN
INTRAMUSCULAR | Status: AC
  Filled 2015-01-25: qty 2

## 2015-01-25 MED ORDER — BUPIVACAINE HCL (PF) 0.25 % IJ SOLN
INTRAMUSCULAR | Status: AC
Start: 2015-01-25 — End: 2015-01-25
  Administered 2015-01-25: 14:00:00
  Filled 2015-01-25: qty 30

## 2015-01-25 MED ORDER — MIDAZOLAM HCL 5 MG/5ML IJ SOLN
INTRAMUSCULAR | Status: AC
Start: 2015-01-25 — End: 2015-01-25
  Administered 2015-01-25: 5 mg via INTRAVENOUS
  Filled 2015-01-25: qty 5

## 2015-01-25 MED ORDER — ORPHENADRINE CITRATE 30 MG/ML IJ SOLN
INTRAMUSCULAR | Status: AC
Start: 2015-01-25 — End: 2015-01-25
  Administered 2015-01-25: 14:00:00
  Filled 2015-01-25: qty 2

## 2015-01-25 MED ORDER — TRIAMCINOLONE ACETONIDE 40 MG/ML IJ SUSP
INTRAMUSCULAR | Status: AC
Start: 2015-01-25 — End: 2015-01-25
  Administered 2015-01-25: 14:00:00
  Filled 2015-01-25: qty 1

## 2015-01-25 MED ORDER — FENTANYL CITRATE (PF) 100 MCG/2ML IJ SOLN
INTRAMUSCULAR | Status: AC
  Administered 2015-01-25: 100 ug via INTRAVENOUS
  Filled 2015-01-25: qty 2

## 2015-01-25 MED ORDER — CEFUROXIME AXETIL 250 MG PO TABS: 250.0000 mg | ORAL_TABLET | Freq: Two times a day (BID) | ORAL | Status: DC

## 2015-01-25 NOTE — Patient Instructions (Addendum)
Pain Management Discharge Instructions   Continue present medications and obtaining your antibiotic Ceftin today and begin taking your antibiotic today as prescribed  F/U PCP Dr.Ehinger  for evaliation of  BP and general medical  condition  F/U surgical evaluation  F/U neurological evaluation Follow up with neurologist as discussed for evaluation of headaches and for general neurological evaluation as discussed  May consider radiofrequency rhizolysis or intraspinal procedures pending response to present treatment and F/U evaluation   Patient to call Pain Management Center should patient have concerns prior to scheduled return appointmen.  ANTIBIOTIC TO BE PICKED UP AT PHARMACY              General Discharge Instructions :  If you need to reach your doctor call: Monday-Friday 8:00 am - 4:00 pm at [phone] or toll free [phone].  After clinic hours [phone] to have operator reach doctor.  Bring all of your medication bottles to all your appointments in the pain clinic.  To cancel or reschedule your appointment with Pain Management please remember to call 24 hours in advance to avoid a fee.  Refer to the educational materials which you have been given on: General Risks, I had my Procedure. Discharge Instructions, Post Sedation.  Post Procedure Instructions:  The drugs you were given will stay in your system until tomorrow, so for the next 24 hours you should not drive, make any legal decisions or drink any alcoholic beverages.  You may eat anything you prefer, but it is better to start with liquids then soups and crackers, and gradually work up to solid foods.  Please notify your doctor immediately if you have any unusual bleeding, trouble breathing or pain that is not related to your normal pain.  Depending on the type of procedure that was done, some parts of your body may feel week and/or numb.  This usually clears up by tonight or the next day.  Walk  with the use of an assistive device or accompanied by an adult for the 24 hours. You may use ice on the affected area for the first 24 hours.  Put ice in a Ziploc bag and cover with a towel and place against area 15 minutes on 15 minutes off.  You may switch to heat after 24 hours.  Intercostal Nerve Block Patient Information  Description: The twelve intercostal nerves arise from the first thru twelfth thoracic nerve roots.  The nerve begins at the spine and wraps around the body, lying in a groove underneath each rib.  Each intercostal nerve innervates a specific strip of skin and body walk of the abdomen and chest.  Therefore, injuries of the chest wall or abdominal wall result in pain that is transmitted back to the brian via the intercostal nerves.  Examples of such injuries include rib fractures and incisions for lung and gall bladder surgery.  Occasionally, pain may persist long after an injury or surgical incision secondary to inflammation and irritation of the intercostal nerve.  The longstanding pain is known as intercostal neuralgia.  An intercostal nerve block is preformed to eliminate pain either temporarily or permanently.  A small needle is placed below the rib and local anesthetic (like Novocaine) and possibly steroid is injected.  Usually 2-4 intercostal nerves are blocked at a time depending on the problem.  The patient will experience a slight "pin-prick" sensation for each injection.  Shortly thereafter, the strip of skin that is innervated by the blocked intercostal nerve will feel numb.  Persistent pain that  is only temporarily relieved with local anesthetic may require a more permanent block. This procedure is called Cryoneurolysis and entails placing a small probe beneath the rib to freeze the nerve.  Conditions that may be treated by intercostal nerve blocks:   Rib fractures  Longstanding pain from surgery of the chest or abdomen (intercostal neuralgia)  Pain from chest  tubes  Pain from trauma to the chest  Preparation for the injections:  1. Do not eat any solid food or dairy products within 6 hours of your appointment. 2. You may drink clear liquids up to 2 hours before appointment.  Clear liquids include water, black coffee, juice or soda.  No milk or cream please. 3. You may take your regular medication, including pain medications, with a sip of water before your appointment.  Diabetics should hold regular insulin (if take separately) and take 1/2 normal NPH dose the morning of the procedure.   Carry some sugar containing items with you to your appointment. 4. A driver must accompany you and be prepared to drive you home after your procedure. 5. Bring all your current medications with you. 6. An IV may be inserted and sedation may be given at the discretion of the physician. 7. A blood pressure cuff, EKG and other monitors will often be applied during the procedure.  Some patients may need to have extra oxygen administered for a short period. 8. You will be asked to provide medical information, including your allergies, prior to the procedure.  We must know immediately if you are taking blood thinners (like Coumadin/Warfarin) or if you are allergic to IV iodine contrast (dye). We must know if you could possible be pregnant.  Possible side-effects:   Bleeding from needle site  Infection (rare)  Nerve injury (rare)  Numbness & tingling of skin  Collapsed lung requiring chest tube (rare)  Local anesthetic toxicity (rare)  Light-headedness (temporary)  Pain at injection site (several days)  Decreased blood pressure (temporary)  Shortness of breath  Jittery/shaking sensation (temporary)  Call if you experience:   Difficulty breathing or hives (go directly to the emergency room)  Redness, inflammation or drainage at the injection site  Severe pain at the site of the injection  Any new symptoms which are concerning   Please  note:  Your pain may subside immediately but may return several hours after the injection.  Often, more than one injection is required to reduce the pain. Also, if several temporary blocks with local anesthetic are ineffective, a more permanent block with cryolysis may be necessary.  This will be discussed with you should this be the case.  If you have any questions, please call [phone] [facility]

## 2015-01-25 NOTE — Progress Notes (Signed)
Subjective:    Patient ID: Sharon Welch, female    DOB: May 22, 1971, 44 y.o.   MRN: [id number]  HPI  PROCEDURE PERFORMED:  Intercostal nerve block.  HISTORY OF PRESENT ILLNESS:  The patient is a 44 y.o. female who returns to the [facility] for further evaluation and treatment of pain involving the upper and mid portions of the back.  The patient is status post instrumentation of the thoracic or lumbar region with kyphoscoliosis of the thoracic or lumbar spine and with severely disabling pain felt to be with a significant component of pain due to intercostal neuralgia and severe muscle spasms in addition to the intraspinal abnormalities. Patient has been felt to be with significant component of pain due to severe muscle spasms with intercostal neuralgiaThere is concern regarding the patient's pain being due to  Intercostal neuralgia with severe muscle spasms The risks, benefits, and expectations of the procedure have been discussed and explained to the patient who was understanding and in agreement with suggested treatment plan. We will proceed with interventional treatment as discussed.   DESCRIPTION OF PROCEDURE: Intercostal nerve block with IV Versed, IV fentanyl conscious sedation, EKG, blood pressure, pulse, and pulse oximetry monitoring. The procedure was performed with the patient in the prone position under fluoroscopic guidance.   Intercostal nerve block,  left side: With the patient in the prone position, Betadine prep of proposed entry site was performed under fluoroscopic guidance with AP view of the thoracic spine. Under fluoroscopic guidance, a 22 -gauge needle was inserted to contact bone of the  12th rib on the  left side after which the needle was repositioned at the inferior border of the  12 rib on the  left side under fluoroscopic guidance. Following documentation of needle placement, at the inferior border of the   12th  rib on the  left side and negative aspiration, a  total of 3 mL of 0.25% bupivacaine with Kenalog was injected for  left side for  12th rib intercostal nerve block.   INTERCOSTAL NERVE BLOCKS AT  T11, T10, T9, T8, and T7 LEVELS: The procedure was performed at these levels as was performed at the previous level, T 12, utilizing the same technique and under fluoroscopic guidance  Myoneural block injections of the thoracic region Following Betadine prep of proposed entry site, a 22-gauge needle was inserted in the thoracic musculature region and following negative aspiration, 2 ml  Of 0.25% bupivacaine with Norflex was injected for myoneural block injections of the thoracic region performed 2  A total of 10 mg Kenalog was utilized for the procedure.   PLAN:   1. Medications: We will continue presently prescribed medications. 2. The patient is to follow up with primary care physician Dr.Ehinger for further evaluation of blood pressure and general medical condition as discussed. 3. Surgical evaluation.. Neurosurgical reevaluation as discussed.  4. Neurological evaluation. 5. May consider the patient for additional studies pending response to treatment and follow-up evaluation. 6. May consider radiofrequency procedures, implantation type procedures and other treatment pending response to treatment and follow-up evaluation. 7. The patient has been advised to adhere to proper body mechanics and to call the Pain Management Center prior to scheduled return appointment should there be significant change in condition or have other concerns regarding condition prior to scheduled return appointment.  The patient is understanding and in agreement with suggested treatment plan.      Review of Systems     Objective:   Physical Exam  Assessment & Plan:

## 2015-01-25 NOTE — Progress Notes (Signed)
Safety precautions to be maintained throughout the outpatient stay will include: orient to surroundings, keep bed in low position, maintain call bell within reach at all times, provide assistance with transfer out of bed and ambulation.  

## 2015-01-26 ENCOUNTER — Telehealth: Payer: Self-pay

## 2015-01-26 NOTE — Telephone Encounter (Signed)
Left message

## 2015-01-31 ENCOUNTER — Encounter: Payer: Medicare Other | Admitting: Pain Medicine

## 2015-02-16 DIAGNOSIS — Z9889 Other specified postprocedural states: Secondary | ICD-10-CM | POA: Diagnosis not present

## 2015-02-16 DIAGNOSIS — J439 Emphysema, unspecified: Secondary | ICD-10-CM | POA: Diagnosis not present

## 2015-02-16 DIAGNOSIS — M546 Pain in thoracic spine: Secondary | ICD-10-CM | POA: Diagnosis not present

## 2015-02-16 DIAGNOSIS — M545 Low back pain: Secondary | ICD-10-CM | POA: Diagnosis not present

## 2015-02-16 DIAGNOSIS — Z981 Arthrodesis status: Secondary | ICD-10-CM | POA: Diagnosis not present

## 2015-02-16 DIAGNOSIS — M5416 Radiculopathy, lumbar region: Secondary | ICD-10-CM | POA: Diagnosis not present

## 2015-03-02 ENCOUNTER — Ambulatory Visit: Payer: Medicare Other | Attending: Pain Medicine | Admitting: Pain Medicine

## 2015-03-02 ENCOUNTER — Encounter: Payer: Self-pay | Admitting: Pain Medicine

## 2015-03-02 VITALS — BP 126/79 | HR 100 | Temp 98.6°F | Resp 18 | Ht 61.0 in | Wt 100.0 lb

## 2015-03-02 DIAGNOSIS — Z9889 Other specified postprocedural states: Secondary | ICD-10-CM | POA: Insufficient documentation

## 2015-03-02 DIAGNOSIS — M169 Osteoarthritis of hip, unspecified: Secondary | ICD-10-CM | POA: Diagnosis not present

## 2015-03-02 DIAGNOSIS — R51 Headache: Secondary | ICD-10-CM | POA: Diagnosis present

## 2015-03-02 DIAGNOSIS — M533 Sacrococcygeal disorders, not elsewhere classified: Secondary | ICD-10-CM | POA: Insufficient documentation

## 2015-03-02 DIAGNOSIS — M160 Bilateral primary osteoarthritis of hip: Secondary | ICD-10-CM | POA: Insufficient documentation

## 2015-03-02 DIAGNOSIS — M546 Pain in thoracic spine: Secondary | ICD-10-CM | POA: Diagnosis present

## 2015-03-02 DIAGNOSIS — M4185 Other forms of scoliosis, thoracolumbar region: Secondary | ICD-10-CM | POA: Insufficient documentation

## 2015-03-02 DIAGNOSIS — M5137 Other intervertebral disc degeneration, lumbosacral region: Secondary | ICD-10-CM

## 2015-03-02 DIAGNOSIS — G43109 Migraine with aura, not intractable, without status migrainosus: Secondary | ICD-10-CM

## 2015-03-02 DIAGNOSIS — M542 Cervicalgia: Secondary | ICD-10-CM | POA: Diagnosis present

## 2015-03-02 DIAGNOSIS — M47817 Spondylosis without myelopathy or radiculopathy, lumbosacral region: Secondary | ICD-10-CM | POA: Diagnosis not present

## 2015-03-02 DIAGNOSIS — M5416 Radiculopathy, lumbar region: Secondary | ICD-10-CM | POA: Diagnosis not present

## 2015-03-02 DIAGNOSIS — M5136 Other intervertebral disc degeneration, lumbar region: Secondary | ICD-10-CM | POA: Diagnosis not present

## 2015-03-02 DIAGNOSIS — M5134 Other intervertebral disc degeneration, thoracic region: Secondary | ICD-10-CM

## 2015-03-02 DIAGNOSIS — G588 Other specified mononeuropathies: Secondary | ICD-10-CM | POA: Insufficient documentation

## 2015-03-02 DIAGNOSIS — M5481 Occipital neuralgia: Secondary | ICD-10-CM | POA: Insufficient documentation

## 2015-03-02 DIAGNOSIS — M5030 Other cervical disc degeneration, unspecified cervical region: Secondary | ICD-10-CM

## 2015-03-02 DIAGNOSIS — R0782 Intercostal pain: Secondary | ICD-10-CM | POA: Diagnosis not present

## 2015-03-02 MED ORDER — DIAZEPAM 5 MG PO TABS: ORAL_TABLET | ORAL | Status: DC

## 2015-03-02 MED ORDER — OXYCODONE HCL 5 MG PO TABS: ORAL_TABLET | ORAL | Status: DC

## 2015-03-02 NOTE — Progress Notes (Signed)
Safety precautions to be maintained throughout the outpatient stay will include: orient to surroundings, keep bed in low position, maintain call bell within reach at all times, provide assistance with transfer out of bed and ambulation.  

## 2015-03-02 NOTE — Progress Notes (Signed)
Subjective:    Patient ID: Sharon Welch, female    DOB: 1970-12-05, 44 y.o.   MRN: [id number]  HPI  Patient for 44-year-old female returns to pain management for further evaluation and treatment of pain involving the region of the neck and entire back upper extremity region headaches. At the present time patient states she has severe pain involving the hips. Patient without trauma change in events of daily living the cause change in symptomatology. Patient localizes the area of pain to be in the region of the hips on the left as well as on the right sides. We discussed patient's condition we will obtain MRIs of the hips and proceed with hip injection at time return appointment in attempt to decrease severity of symptoms, minimize progression of symptoms, and avoid need for more involved treatment. The patient was understanding and agreement status treatment plan will continue present medications at this time including oxycodone and days appointment. The patient was in agreement with suggested treatment plan and we'll proceed with surgical evaluation of the hips as well as follow-up with Dr.Hsu regarding reevaluation of pain of the cervical thoracic lumbar upper and lower extremity regions.     Review of Systems     Objective:   Physical Exam There was tends to palpation of the region of the spleen is Occipitalis region of mild to moderate degree. No masses of the head and neck were noted. There was tenderness of the acromioclavicular glenohumeral joint region of mild degree. Patient appeared to be with slightly decreased grip strength. Tinel and Phalen's maneuver without increased pain of significant degree. There was tenderness of the cervical facet cervical paraspinal muscles and the thoracic facet thoracic paraspinal muscles region of mild to moderate degree. No crepitus of the thoracic region was noted. There was tennis over the lumbar paraspinal muscle lumbar facet region of moderate degree.  Straight leg raising was limited to approximately 10. There was tends to palpation of the knees with crepitus of the knees with negative anterior posterior drawer signs without ballottement of the patella no increased warmth or erythema the knees noted. Palpation of the hips were with tenderness to palpation there was tends to palpation of the PSIS PII S region of moderate moderately severe degree. Patient unable to perform Patrick's maneuver. There was significant increased pain with attempt to perform Patrick's maneuver on the left as well as on the right. There was moderate severe tenderness over the PSIS PII S region. There was question decreased sensation L5 dermatomal distribution with negative clonus negative Homans. Abdomen nontender no costovertebral maintenance noted.         Assessment & Plan:    Degenerative disc disease thoracic spine Thoracic facet syndrome Status post Harrington rods thoracic and lumbar regions for severe scoliosis of the thoracolumbar spine  Degenerative disc disease lumbar spine Lumbar facet syndrome Status post Harrington rods for severe scoliosis of the thoracic and lumbar regions  Degenerative joint disease of hips  Degenerative joint disease of knees  Sacroiliac joint dysfunction  Intercostal neuralgia  Greater occipital neuralgia  Cervical facet syndrome    PLAN   Continue present medication diazepam and oxycodone  Will perform hip injection at time return appointment  F/U PCP Dr Marisue Humble  for evaliation of  BP and general medical  condition  F/U surgical evaluation as planned  F/U neurological evaluation as planned  Surgical evaluation of hips scheduled  MRI of hips scheduled  May consider radiofrequency rhizolysis or intraspinal procedures pending response to  present treatment and F/U evaluation   Patient to call Pain Management Center should patient have concerns prior to scheduled return appointment.

## 2015-03-02 NOTE — Patient Instructions (Addendum)
PLAN   Continue present medication  Valium and oxycodone  Hip injection to be performed at time of return appointment  F/U PCP for evaliation of  BP and general medical  condition  F/U surgical evaluation  Please ask Sharon Welch date of orthopedic evaluation of hips and date of MRIs of the hips  F/U neurological evaluation as discussed  F/U psych evaluation as discussed  May consider radiofrequency rhizolysis or intraspinal procedures pending response to present treatment and F/U evaluation   Patient to call Pain Management Center should patient have concerns prior to scheduled return appointment. Trigger Point Injections Patient Information  Description: Trigger points are areas of muscle sensitive to touch which cause pain with movement, sometimes felt some distance from the site of palpation.  Usually the muscle containing these trigger points if felt as a tight band or knot.   The area of maximum tenderness or trigger point is identified, and after antiseptic preparation of the skin, a small needle is placed into this site.  Reproduction of the pain often occurs and numbing medicine (local anesthetic) is injected into the site, sometimes along with steroid preparation.  The entire block usually lasts less than 5 minutes.  Conditions which may be treated by trigger points:   Muscular pain and spasm  Nerve irritation  Preparation for the injection:  1. Do not eat any solid food or dairy products within 6 hours of your appointment. 2. You may drink clear liquids up to 2 hours before appointment.  Clear liquids include water, black coffee, juice or soda.  No milk or cream please. 3. You may take your regular medications, including pain medications, with a sip of water before your appointment.  Diabetics should hold regular insulin ( if take separately) and take 1/2 normal NPH dose the morning of the procedure.  Carry some sugar containing items with you to your appointment. 4. A driver  must accompany you and be prepared to drive you home after your procedure.  5. Bring all your current medications with you. 6. An IV may be inserted and sedation may be given at the discretion of the physician.  7. A blood pressure cuff, EKG, and other monitors will often be applied during the procedure.  Some patients may need to have extra oxygen administered for a short period. 8. You will be asked to provide medical information, including your allergies and medications, prior to the procedure.  We must know immediately if you are taking blood thinners (like Coumadin/Warfarin) or if you are allergic to IV iodine contrast (dye).  We must know if you could possibly be pregnant.  Possible side-effects:   Bleeding from needle site  Infection (rare, may require surgery)  Nerve injury (rare)  Numbness & tingling (temporary)  Punctured lung (if injection around chest)  Light-headedness (temporary)  Pain at injection site (several days)  Decreased blood pressure (rare, temporary)  Weakness in arm/leg (temporary)  Call if you experience:   Hive or difficulty breathing (go to the emergency room)  Inflammation or drainage at the injection site(s)  Please note:  Although the local anesthetic injected can often make your painful muscle feel good for several hours after the injection, the pain may return.  It takes 3-7 days for steroids to work.  You may not notice any pain relief for at least one week.  If effective, we will often do a series of injections spaced 3-6 weeks apart to maximally decrease your pain.  If you have any questions please  call [phone] [facility]

## 2015-03-02 NOTE — Progress Notes (Signed)
Discharged to home ambulatory with script in hand for Valium and oxycodone.  Pre procedure instructions given with teach back 3 done.

## 2015-03-09 DIAGNOSIS — F17210 Nicotine dependence, cigarettes, uncomplicated: Secondary | ICD-10-CM | POA: Diagnosis not present

## 2015-03-09 DIAGNOSIS — M5410 Radiculopathy, site unspecified: Secondary | ICD-10-CM | POA: Diagnosis not present

## 2015-03-09 DIAGNOSIS — M545 Low back pain: Secondary | ICD-10-CM | POA: Diagnosis not present

## 2015-03-09 DIAGNOSIS — G8929 Other chronic pain: Secondary | ICD-10-CM | POA: Diagnosis not present

## 2015-03-09 DIAGNOSIS — M8580 Other specified disorders of bone density and structure, unspecified site: Secondary | ICD-10-CM | POA: Diagnosis not present

## 2015-03-09 DIAGNOSIS — Z981 Arthrodesis status: Secondary | ICD-10-CM | POA: Diagnosis not present

## 2015-03-13 ENCOUNTER — Ambulatory Visit: Payer: Medicare Other | Admitting: Pain Medicine

## 2015-03-17 ENCOUNTER — Ambulatory Visit: Payer: Medicare Other

## 2015-03-17 ENCOUNTER — Ambulatory Visit
Admission: RE | Admit: 2015-03-17 | Discharge: 2015-03-17 | Disposition: A | Discharge status: Home / Self Care | Payer: Medicare Other | Source: Ambulatory Visit | Attending: Pain Medicine | Admitting: Pain Medicine

## 2015-03-17 DIAGNOSIS — M5030 Other cervical disc degeneration, unspecified cervical region: Secondary | ICD-10-CM

## 2015-03-17 DIAGNOSIS — G588 Other specified mononeuropathies: Secondary | ICD-10-CM

## 2015-03-17 DIAGNOSIS — M1612 Unilateral primary osteoarthritis, left hip: Secondary | ICD-10-CM | POA: Diagnosis not present

## 2015-03-17 DIAGNOSIS — M5134 Other intervertebral disc degeneration, thoracic region: Secondary | ICD-10-CM

## 2015-03-17 DIAGNOSIS — M5481 Occipital neuralgia: Secondary | ICD-10-CM

## 2015-03-17 DIAGNOSIS — G43109 Migraine with aura, not intractable, without status migrainosus: Secondary | ICD-10-CM

## 2015-03-17 DIAGNOSIS — M25551 Pain in right hip: Secondary | ICD-10-CM | POA: Diagnosis not present

## 2015-03-17 DIAGNOSIS — M5137 Other intervertebral disc degeneration, lumbosacral region: Secondary | ICD-10-CM

## 2015-03-17 DIAGNOSIS — M160 Bilateral primary osteoarthritis of hip: Secondary | ICD-10-CM

## 2015-03-17 DIAGNOSIS — M25552 Pain in left hip: Secondary | ICD-10-CM | POA: Diagnosis not present

## 2015-03-17 DIAGNOSIS — M1611 Unilateral primary osteoarthritis, right hip: Secondary | ICD-10-CM | POA: Diagnosis not present

## 2015-03-20 ENCOUNTER — Encounter: Payer: Self-pay | Admitting: Pain Medicine

## 2015-03-20 ENCOUNTER — Ambulatory Visit: Payer: Medicare Other | Attending: Pain Medicine | Admitting: Pain Medicine

## 2015-03-20 VITALS — BP 127/74 | HR 62 | Temp 97.8°F | Resp 16 | Ht 61.0 in | Wt 88.0 lb

## 2015-03-20 DIAGNOSIS — M542 Cervicalgia: Secondary | ICD-10-CM | POA: Diagnosis not present

## 2015-03-20 DIAGNOSIS — M160 Bilateral primary osteoarthritis of hip: Secondary | ICD-10-CM | POA: Diagnosis not present

## 2015-03-20 DIAGNOSIS — G588 Other specified mononeuropathies: Secondary | ICD-10-CM

## 2015-03-20 DIAGNOSIS — M5030 Other cervical disc degeneration, unspecified cervical region: Secondary | ICD-10-CM

## 2015-03-20 DIAGNOSIS — M5481 Occipital neuralgia: Secondary | ICD-10-CM

## 2015-03-20 DIAGNOSIS — M546 Pain in thoracic spine: Secondary | ICD-10-CM | POA: Diagnosis not present

## 2015-03-20 DIAGNOSIS — Z9889 Other specified postprocedural states: Secondary | ICD-10-CM | POA: Diagnosis not present

## 2015-03-20 DIAGNOSIS — M169 Osteoarthritis of hip, unspecified: Secondary | ICD-10-CM | POA: Diagnosis not present

## 2015-03-20 DIAGNOSIS — S4382XD Sprain of other specified parts of left shoulder girdle, subsequent encounter: Secondary | ICD-10-CM

## 2015-03-20 DIAGNOSIS — S46812D Strain of other muscles, fascia and tendons at shoulder and upper arm level, left arm, subsequent encounter: Secondary | ICD-10-CM

## 2015-03-20 DIAGNOSIS — G43109 Migraine with aura, not intractable, without status migrainosus: Secondary | ICD-10-CM

## 2015-03-20 DIAGNOSIS — M5137 Other intervertebral disc degeneration, lumbosacral region: Secondary | ICD-10-CM

## 2015-03-20 DIAGNOSIS — M5134 Other intervertebral disc degeneration, thoracic region: Secondary | ICD-10-CM

## 2015-03-20 MED ORDER — FENTANYL CITRATE (PF) 100 MCG/2ML IJ SOLN
INTRAMUSCULAR | Status: AC
  Administered 2015-03-20: 100 ug via INTRAVENOUS
  Filled 2015-03-20: qty 2

## 2015-03-20 MED ORDER — BUPIVACAINE HCL (PF) 0.25 % IJ SOLN
INTRAMUSCULAR | Status: AC
  Administered 2015-03-20: 10 mL
  Filled 2015-03-20: qty 30

## 2015-03-20 MED ORDER — TRIAMCINOLONE ACETONIDE 40 MG/ML IJ SUSP
INTRAMUSCULAR | Status: AC
  Filled 2015-03-20: qty 1

## 2015-03-20 MED ORDER — MIDAZOLAM HCL 5 MG/5ML IJ SOLN
INTRAMUSCULAR | Status: AC
  Administered 2015-03-20: 5 mg via INTRAVENOUS
  Filled 2015-03-20: qty 5

## 2015-03-20 MED ORDER — METHYLPREDNISOLONE ACETATE 40 MG/ML IJ SUSP
INTRAMUSCULAR | Status: AC
  Administered 2015-03-20: 40 mg
  Filled 2015-03-20: qty 1

## 2015-03-20 MED ORDER — ORPHENADRINE CITRATE 30 MG/ML IJ SOLN
INTRAMUSCULAR | Status: AC
  Filled 2015-03-20: qty 2

## 2015-03-20 MED ORDER — CEFAZOLIN SODIUM 1 G IJ SOLR
INTRAMUSCULAR | Status: AC
  Administered 2015-03-20: 1 g via INTRAVENOUS
  Filled 2015-03-20: qty 10

## 2015-03-20 NOTE — Progress Notes (Signed)
Safety precautions to be maintained throughout the outpatient stay will include: orient to surroundings, keep bed in low position, maintain call bell within reach at all times, provide assistance with transfer out of bed and ambulation.  

## 2015-03-20 NOTE — Patient Instructions (Addendum)
PLAN  Continue present medication and begin taking antibiotic Ceftin as prescribed. Please obtain your antibiotic Ceftin today and begin taking antibiotic today  F/U PCP Dr.Ehinger for evaliation of  BP and general medical  condition.  F/U surgical evaluation as discussed  F/U neurological evaluation. May consider pending follow-up evaluations  May consider radiofrequency rhizolysis or intraspinal procedures pending response to present treatment and F/U evaluation.  Patient to call Pain Management Center should patient have concerns prior to scheduled return appointment.  Joint Injection Care After Refer to this sheet in the next few days. These instructions provide you with information on caring for yourself after you have had a joint injection. Your caregiver also may give you more specific instructions. Your treatment has been planned according to current medical practices, but problems sometimes occur. Call your caregiver if you have any problems or questions after your procedure. After any type of joint injection, it is not uncommon to experience:  Soreness, swelling, or bruising around the injection site.  Mild numbness, tingling, or weakness around the injection site caused by the numbing medicine used before or with the injection. It also is possible to experience the following effects associated with the specific agent after injection:  Iodine-based contrast agents:  Allergic reaction (itching, hives, widespread redness, and swelling beyond the injection site).  Corticosteroids (These effects are rare.):  Allergic reaction.  Increased blood sugar levels (If you have diabetes and you notice that your blood sugar levels have increased, notify your caregiver).  Increased blood pressure levels.  Mood swings.  Hyaluronic acid in the use of viscosupplementation.  Temporary heat or redness.  Temporary rash and itching.  Increased fluid accumulation in the injected  joint. These effects all should resolve within a day after your procedure.  HOME CARE INSTRUCTIONS  Limit yourself to light activity the day of your procedure. Avoid lifting heavy objects, bending, stooping, or twisting.  Take prescription or over-the-counter pain medication as directed by your caregiver.  You may apply ice to your injection site to reduce pain and swelling the day of your procedure. Ice may be applied 03-04 times:  Put ice in a plastic bag.  Place a towel between your skin and the bag.  Leave the ice on for no longer than 15-20 minutes each time. SEEK IMMEDIATE MEDICAL CARE IF:   Pain and swelling get worse rather than better or extend beyond the injection site.  Numbness does not go away.  Blood or fluid continues to leak from the injection site.  You have chest pain.  You have swelling of your face or tongue.  You have trouble breathing or you become dizzy.  You develop a fever, chills, or severe tenderness at the injection site that last longer than 1 day. MAKE SURE YOU:  Understand these instructions.  Watch your condition.  Get help right away if you are not doing well or if you get worse. Document Released: 02/21/2011 Document Revised: 09/02/2011 Document Reviewed: 02/21/2011 [facility] Patient Information 2015 [place], [place]. This information is not intended to replace advice given to you by your health care provider. Make sure you discuss any questions you have with your health care provider. Pain Management Discharge Instructions  General Discharge Instructions :  If you need to reach your doctor call: Monday-Friday 8:00 am - 4:00 pm at [phone] or toll free [phone].  After clinic hours [phone] to have operator reach doctor.  Bring all of your medication bottles to all your appointments in the pain clinic.  To cancel or reschedule your appointment with Pain Management please remember to call 24 hours in advance to avoid a  fee.  Refer to the educational materials which you have been given on: General Risks, I had my Procedure. Discharge Instructions, Post Sedation.  Post Procedure Instructions:  The drugs you were given will stay in your system until tomorrow, so for the next 24 hours you should not drive, make any legal decisions or drink any alcoholic beverages.  You may eat anything you prefer, but it is better to start with liquids then soups and crackers, and gradually work up to solid foods.  Please notify your doctor immediately if you have any unusual bleeding, trouble breathing or pain that is not related to your normal pain.  Depending on the type of procedure that was done, some parts of your body may feel week and/or numb.  This usually clears up by tonight or the next day.  Walk with the use of an assistive device or accompanied by an adult for the 24 hours.  You may use ice on the affected area for the first 24 hours.  Put ice in a Ziploc bag and cover with a towel and place against area 15 minutes on 15 minutes off.  You may switch to heat after 24 hours.

## 2015-03-20 NOTE — Progress Notes (Signed)
Patient here for appt today

## 2015-03-20 NOTE — Progress Notes (Signed)
   Subjective:    Patient ID: Nelta Numbers, female    DOB: March 09, 1971, 44 y.o.   MRN: [id number]  HPI                                                   LEFT HIP INJECTION   Patient is a 44 year old female who [facility] and treatment of pain involving the regions of the neck and entire back upper and lower extremity regions with severely disabling pain involving the hips. Prior studies reveal patient to be with degenerative changes of the hips. Patient is with prior surgeries of the thoracic lumbar spine with significant pain involving the lumbar lower extremity regions as well as the thoracic  region. Decision has been made to proceed with injection of hip and attempt to decrease severely disabling pain of the left hip, prevent progression of symptoms, and avoid the need for more involved treatment. Patient was understanding and in agreement with suggested treatment plan   Description Of Procedure:  Left Hip Injection  The patient was taken to fluoroscopy suite and patient was seeing the supine position. EKG blood pressure pulse and pulse oximetry monitoring were all in place. Patient received IV Versed and fentanyl for conscious sedation. Betadine prep of proposed entry site was accomplished and local anesthetic skin wheal of 1.5% lidocaine plain was performed at the proposed needle point entry site. A 22-gauge needle was inserted under fluoroscopic guidance in the region of the neck of the femur a total of 2 cc of 0.25% bupivacaine with Depo-Medrol was injected for left hip injection. Needle removed. Hard procedure well   A total of 40 mg of Depo-Medrol was utilized for the procedure   Continue present medication Elavil diazepam and oxycodone  F/U PCP for evaliation of  BP and general medical  condition  F/U surgical evaluation. Follow-up surgical evaluation as discussed  F/U neurological evaluation. May consider pending follow-up  evaluations  May consider radiofrequency rhizolysis or intraspinal procedures pending response to present treatment and F/U evaluation   [facility] to call [facility] should patient have concerns prior to scheduled return appointment.     Review of Systems     Objective:   Physical Exam        Assessment & Plan:

## 2015-03-21 ENCOUNTER — Telehealth: Payer: Self-pay | Admitting: *Deleted

## 2015-03-21 NOTE — Telephone Encounter (Signed)
Left voice mail

## 2015-03-29 DIAGNOSIS — H52203 Unspecified astigmatism, bilateral: Secondary | ICD-10-CM | POA: Diagnosis not present

## 2015-03-29 DIAGNOSIS — H5713 Ocular pain, bilateral: Secondary | ICD-10-CM | POA: Diagnosis not present

## 2015-04-03 ENCOUNTER — Encounter: Payer: Medicare Other | Admitting: Pain Medicine

## 2015-04-04 ENCOUNTER — Encounter: Payer: Self-pay | Admitting: Pain Medicine

## 2015-04-04 ENCOUNTER — Ambulatory Visit: Payer: Medicare Other | Attending: Pain Medicine | Admitting: Pain Medicine

## 2015-04-04 VITALS — BP 100/71 | HR 74 | Temp 97.8°F | Resp 14 | Ht 61.0 in | Wt 84.0 lb

## 2015-04-04 DIAGNOSIS — M5137 Other intervertebral disc degeneration, lumbosacral region: Secondary | ICD-10-CM

## 2015-04-04 DIAGNOSIS — G588 Other specified mononeuropathies: Secondary | ICD-10-CM | POA: Diagnosis not present

## 2015-04-04 DIAGNOSIS — M160 Bilateral primary osteoarthritis of hip: Secondary | ICD-10-CM | POA: Diagnosis not present

## 2015-04-04 DIAGNOSIS — M47817 Spondylosis without myelopathy or radiculopathy, lumbosacral region: Secondary | ICD-10-CM | POA: Diagnosis not present

## 2015-04-04 DIAGNOSIS — M545 Low back pain: Secondary | ICD-10-CM | POA: Diagnosis present

## 2015-04-04 DIAGNOSIS — M4185 Other forms of scoliosis, thoracolumbar region: Secondary | ICD-10-CM | POA: Insufficient documentation

## 2015-04-04 DIAGNOSIS — M51379 Other intervertebral disc degeneration, lumbosacral region without mention of lumbar back pain or lower extremity pain: Secondary | ICD-10-CM

## 2015-04-04 DIAGNOSIS — M5136 Other intervertebral disc degeneration, lumbar region: Secondary | ICD-10-CM | POA: Diagnosis not present

## 2015-04-04 DIAGNOSIS — M5481 Occipital neuralgia: Secondary | ICD-10-CM

## 2015-04-04 DIAGNOSIS — S4382XD Sprain of other specified parts of left shoulder girdle, subsequent encounter: Secondary | ICD-10-CM

## 2015-04-04 DIAGNOSIS — G43109 Migraine with aura, not intractable, without status migrainosus: Secondary | ICD-10-CM

## 2015-04-04 DIAGNOSIS — M5416 Radiculopathy, lumbar region: Secondary | ICD-10-CM | POA: Diagnosis not present

## 2015-04-04 DIAGNOSIS — M5030 Other cervical disc degeneration, unspecified cervical region: Secondary | ICD-10-CM

## 2015-04-04 DIAGNOSIS — M533 Sacrococcygeal disorders, not elsewhere classified: Secondary | ICD-10-CM | POA: Insufficient documentation

## 2015-04-04 DIAGNOSIS — Z9889 Other specified postprocedural states: Secondary | ICD-10-CM | POA: Diagnosis not present

## 2015-04-04 DIAGNOSIS — M5134 Other intervertebral disc degeneration, thoracic region: Secondary | ICD-10-CM

## 2015-04-04 DIAGNOSIS — M170 Bilateral primary osteoarthritis of knee: Secondary | ICD-10-CM | POA: Insufficient documentation

## 2015-04-04 DIAGNOSIS — S46812D Strain of other muscles, fascia and tendons at shoulder and upper arm level, left arm, subsequent encounter: Secondary | ICD-10-CM

## 2015-04-04 DIAGNOSIS — M169 Osteoarthritis of hip, unspecified: Secondary | ICD-10-CM | POA: Diagnosis not present

## 2015-04-04 DIAGNOSIS — M546 Pain in thoracic spine: Secondary | ICD-10-CM | POA: Diagnosis present

## 2015-04-04 DIAGNOSIS — M791 Myalgia: Secondary | ICD-10-CM | POA: Diagnosis not present

## 2015-04-04 MED ORDER — OXYCODONE HCL 5 MG PO TABS: ORAL_TABLET | ORAL | Status: DC

## 2015-04-04 MED ORDER — DIAZEPAM 5 MG PO TABS: ORAL_TABLET | ORAL | Status: DC

## 2015-04-04 NOTE — Progress Notes (Signed)
Subjective:    Patient ID: Sharon Welch, female    DOB: 1970/10/10, 44 y.o.   MRN: [id number]  HPI Patient is 44 year old female returns to [place] for further evaluation and treatment of pain involving the upper mid and lower back lower extremity region and neck as well . Patient states she had tremendous relief of pain with greater than 75% relief of pain following intercostal nerve blocks. We discussed patient's condition patient continues presently prescribed medications. We discussed patient undergoing follow up surgical evaluation which patient will proceed with doing and patient will follow-up with her neurologist as well. Patient is in hopes of being able to undergo intercostal nerve blocks and attempt to decrease severity of pain was significantly and avoid return of pain to the severity disabling intensity which it had been. We'll proceed with intercostal nerve blocks and attempt to decrease severity of patient's symptoms, minimize progression of patient's symptoms, and avoid need for involved treatment. The patient was understanding and agrees with suggested treatment plan.   Review of Systems     Objective:   Physical Exam  There was tenderness of the splenius capitis Cipro talus musculature region of mild to moderate degree. There was mild to moderate tenderness over the cervical facet cervical paraspinal musculature region as well as the splenius capitate and occipitalis musculature regions. There was no crepitus of the thoracic region noted. Palpation over the thoracic paraspinal musculature thoracic facet region was with moderate to moderately severe discomfort. There was tenderness of the acromioclavicular and glenohumeral joint region of mild to moderate degree. Tinel and Phalen's maneuver were without increase of pain significant degree. Patient appeared to be with bilaterally equal grip strength. Palpation over the thoracic paraspinal muscle and thoracic facet  region was with moderately severe tenderness to palpation with severe severe muscle spasms of the thoracic paraspinal musculature region. Palpation over the lumbar paraspinal muscles lumbar facet region associated with moderate discomfort. Lateral bending and rotation associated with moderate discomfort. There was cataract call lumbar tends to palpation with moderate to moderately severe muscle spasms noted thoracic or lumbar region. Straight leg raising was tolerates approximately 20 without increased pain with dorsiflexion noted with negative clonus negative Homans. DTRs difficult to elicit patient had difficulty relaxing. There was crepitus of the knees with site increase of pain with range of motion maneuvers of the knee. There was negative anterior and posterior drawer signs without ballottement of the patella. EHL strength appeared to be decreased there was negative clonus negative Homans no sensory deficit of dermatomal distribution detected. There was tenderness over the PSIS and PII S region of mild to moderate degree. Abdomen nontender with no costovertebral angle tenderness noted      Assessment & Plan:     Degenerative disc disease thoracic spine Thoracic facet syndrome Status post Harrington rods thoracic and lumbar regions for severe scoliosis of the thoracolumbar spine  Degenerative disc disease lumbar spine Lumbar facet syndrome Status post Harrington rods for severe scoliosis of the thoracic and lumbar regions  Degenerative joint disease of hips  Degenerative joint disease of knees  Sacroiliac joint dysfunction  Intercostal neuralgia    PLAN   Continue present medication diazepam and oxycodone  Intercostal nerve blocks to be performed at time return appointment  F/U PCP for evaliation of  BP and general medical  condition  F/U surgical evaluation with Dr.Hsu as planned   F/U neurological evaluation with Dr.Baute as planned  F/U psych evaluation   May  consider radiofrequency  rhizolysis or intraspinal procedures pending response to present treatment and F/U evaluation   [facility] to call [facility] should patient have concerns prior to scheduled return appointment.

## 2015-04-04 NOTE — Progress Notes (Signed)
Safety precautions to be maintained throughout the outpatient stay will include: orient to surroundings, keep bed in low position, maintain call bell within reach at all times, provide assistance with transfer out of bed and ambulation.  

## 2015-04-04 NOTE — Patient Instructions (Addendum)
PLAN   Continue present medication  diazepam and oxycodone  Intercostal nerve block to be performed at time return appointment  F/U PCP for evaliation of  BP and general medical  condition  F/U surgical evaluation as planned  F/U neurological evaluation as planned with Dr.Baute as discussed   May consider radiofrequency rhizolysis or intraspinal procedures pending response to present treatment and F/U evaluation   Patient to call [facility] should patient have concerns prior to scheduled return appointment. GENERAL RISKS AND COMPLICATIONS  What are the risk, side effects and possible complications? Generally speaking, most procedures are safe.  However, with any procedure there are risks, side effects, and the possibility of complications.  The risks and complications are dependent upon the sites that are lesioned, or the type of nerve block to be performed.  The closer the procedure is to the spine, the more serious the risks are.  Great care is taken when placing the radio frequency needles, block needles or lesioning probes, but sometimes complications can occur. 1. Infection: Any time there is an injection through the skin, there is a risk of infection.  This is why sterile conditions are used for these blocks.  There are four possible types of infection. 1. Localized skin infection. 2. Central Nervous System Infection-This can be in the form of Meningitis, which can be deadly. 3. Epidural Infections-This can be in the form of an epidural abscess, which can cause pressure inside of the spine, causing compression of the spinal cord with subsequent paralysis. This would require an emergency surgery to decompress, and there are no guarantees that the patient would recover from the paralysis. 4. Discitis-This is an infection of the intervertebral discs.  It occurs in about 1% of discography procedures.  It is difficult to treat and it may lead to surgery.        2. Pain: the  needles have to go through skin and soft tissues, will cause soreness.       3. Damage to internal structures:  The nerves to be lesioned may be near blood vessels or    other nerves which can be potentially damaged.       4. Bleeding: Bleeding is more common if the patient is taking blood thinners such as  aspirin, Coumadin, Ticiid, Plavix, etc., or if he/she have some genetic predisposition  such as hemophilia. Bleeding into the spinal canal can cause compression of the spinal  cord with subsequent paralysis.  This would require an emergency surgery to  decompress and there are no guarantees that the patient would recover from the  paralysis.       5. Pneumothorax:  Puncturing of a lung is a possibility, every time a needle is introduced in  the area of the chest or upper back.  Pneumothorax refers to free air around the  collapsed lung(s), inside of the thoracic cavity (chest cavity).  Another two possible  complications related to a similar event would include: Hemothorax and Chylothorax.   These are variations of the Pneumothorax, where instead of air around the collapsed  lung(s), you may have blood or chyle, respectively.       6. Spinal headaches: They may occur with any procedures in the area of the spine.       7. Persistent CSF (Cerebro-Spinal Fluid) leakage: This is a rare problem, but may occur  with prolonged intrathecal or epidural catheters either due to the formation of a fistulous  track or a dural tear.  8. Nerve damage: By working so close to the spinal cord, there is always a possibility of  nerve damage, which could be as serious as a permanent spinal cord injury with  paralysis.       9. Death:  Although rare, severe deadly allergic reactions known as "Anaphylactic  reaction" can occur to any of the medications used.      10. Worsening of the symptoms:  We can always make thing worse.  What are the chances of something like this happening? Chances of any of this occuring are  extremely low.  By statistics, you have more of a chance of getting killed in a motor vehicle accident: while driving to the hospital than any of the above occurring .  Nevertheless, you should be aware that they are possibilities.  In general, it is similar to taking a shower.  Everybody knows that you can slip, hit your head and get killed.  Does that mean that you should not shower again?  Nevertheless always keep in mind that statistics do not mean anything if you happen to be on the wrong side of them.  Even if a procedure has a 1 (one) in a 1,000,000 (million) chance of going wrong, it you happen to be that one..Also, keep in mind that by statistics, you have more of a chance of having something go wrong when taking medications.  Who should not have this procedure? If you are on a blood thinning medication (e.g. Coumadin, Plavix, see list of "Blood Thinners"), or if you have an active infection going on, you should not have the procedure.  If you are taking any blood thinners, please inform your physician.  How should I prepare for this procedure?  Do not eat or drink anything at least six hours prior to the procedure.  Bring a driver with you .  It cannot be a taxi.  Come accompanied by an adult that can drive you back, and that is strong enough to help you if your legs get weak or numb from the local anesthetic.  Take all of your medicines the morning of the procedure with just enough water to swallow them.  If you have diabetes, make sure that you are scheduled to have your procedure done first thing in the morning, whenever possible.  If you have diabetes, take only half of your insulin dose and notify our nurse that you have done so as soon as you arrive at the clinic.  If you are diabetic, but only take blood sugar pills (oral hypoglycemic), then do not take them on the morning of your procedure.  You may take them after you have had the procedure.  Do not take aspirin or any  aspirin-containing medications, at least eleven (11) days prior to the procedure.  They may prolong bleeding.  Wear loose fitting clothing that may be easy to take off and that you would not mind if it got stained with Betadine or blood.  Do not wear any jewelry or perfume  Remove any nail coloring.  It will interfere with some of our monitoring equipment.  NOTE: Remember that this is not meant to be interpreted as a complete list of all possible complications.  Unforeseen problems may occur.  BLOOD THINNERS The following drugs contain aspirin or other products, which can cause increased bleeding during surgery and should not be taken for 2 weeks prior to and 1 week after surgery.  If you should need take something for relief of minor  pain, you may take acetaminophen which is found in Tylenol,m Datril, Anacin-3 and Panadol. It is not blood thinner. The products listed below are.  Do not take any of the products listed below in addition to any listed on your instruction sheet.  A.P.C or A.P.C with Codeine Codeine Phosphate Capsules #3 Ibuprofen Ridaura  ABC compound Congesprin Imuran rimadil  Advil Cope Indocin Robaxisal  Alka-Seltzer Effervescent Pain Reliever and Antacid Coricidin or Coricidin-D  Indomethacin Rufen  Alka-Seltzer plus Cold Medicine Cosprin Ketoprofen S-A-C Tablets  Anacin Analgesic Tablets or Capsules Coumadin Korlgesic Salflex  Anacin Extra Strength Analgesic tablets or capsules CP-2 Tablets Lanoril Salicylate  Anaprox Cuprimine Capsules Levenox Salocol  Anexsia-D Dalteparin Magan Salsalate  Anodynos Darvon compound Magnesium Salicylate Sine-off  Ansaid Dasin Capsules Magsal Sodium Salicylate  Anturane Depen Capsules Marnal Soma  APF Arthritis pain formula Dewitt's Pills Measurin Stanback  Argesic Dia-Gesic Meclofenamic Sulfinpyrazone  Arthritis Bayer Timed Release Aspirin Diclofenac Meclomen Sulindac  Arthritis pain formula Anacin Dicumarol Medipren Supac  Analgesic  (Safety coated) Arthralgen Diffunasal Mefanamic Suprofen  Arthritis Strength Bufferin Dihydrocodeine Mepro Compound Suprol  Arthropan liquid Dopirydamole Methcarbomol with Aspirin Synalgos  ASA tablets/Enseals Disalcid Micrainin Tagament  Ascriptin Doan's Midol Talwin  Ascriptin A/D Dolene Mobidin Tanderil  Ascriptin Extra Strength Dolobid Moblgesic Ticlid  Ascriptin with Codeine Doloprin or Doloprin with Codeine Momentum Tolectin  Asperbuf Duoprin Mono-gesic Trendar  Aspergum Duradyne Motrin or Motrin IB Triminicin  Aspirin plain, buffered or enteric coated Durasal Myochrisine Trigesic  Aspirin Suppositories Easprin Nalfon Trillsate  Aspirin with Codeine Ecotrin Regular or Extra Strength Naprosyn Uracel  Atromid-S Efficin Naproxen Ursinus  Auranofin Capsules Elmiron Neocylate Vanquish  Axotal Emagrin Norgesic Verin  Azathioprine Empirin or Empirin with Codeine Normiflo Vitamin E  Azolid Emprazil Nuprin Voltaren  Bayer Aspirin plain, buffered or children's or timed BC Tablets or powders Encaprin Orgaran Warfarin Sodium  Buff-a-Comp Enoxaparin Orudis Zorpin  Buff-a-Comp with Codeine Equegesic Os-Cal-Gesic   Buffaprin Excedrin plain, buffered or Extra Strength Oxalid   Bufferin Arthritis Strength Feldene Oxphenbutazone   Bufferin plain or Extra Strength Feldene Capsules Oxycodone with Aspirin   Bufferin with Codeine Fenoprofen Fenoprofen Pabalate or Pabalate-SF   Buffets II Flogesic Panagesic   Buffinol plain or Extra Strength Florinal or Florinal with Codeine Panwarfarin   Buf-Tabs Flurbiprofen Penicillamine   Butalbital Compound Four-way cold tablets Penicillin   Butazolidin Fragmin Pepto-Bismol   Carbenicillin Geminisyn Percodan   Carna Arthritis Reliever Geopen Persantine   Carprofen Gold's salt Persistin   Chloramphenicol Goody's Phenylbutazone   Chloromycetin Haltrain Piroxlcam   Clmetidine heparin Plaquenil   Cllnoril Hyco-pap Ponstel   Clofibrate Hydroxy chloroquine  Propoxyphen         Before stopping any of these medications, be sure to consult the physician who ordered them.  Some, such as Coumadin (Warfarin) are ordered to prevent or treat serious conditions such as "deep thrombosis", "pumonary embolisms", and other heart problems.  The amount of time that you may need off of the medication may also vary with the medication and the reason for which you were taking it.  If you are taking any of these medications, please make sure you notify your pain physician before you undergo any procedures.         Intercostal Nerve Block Patient Information  Description: The twelve intercostal nerves arise from the first thru twelfth thoracic nerve roots.  The nerve begins at the spine and wraps around the body, lying in a groove underneath each rib.  Each intercostal nerve innervates  a specific strip of skin and body walk of the abdomen and chest.  Therefore, injuries of the chest wall or abdominal wall result in pain that is transmitted back to the brian via the intercostal nerves.  Examples of such injuries include rib fractures and incisions for lung and gall bladder surgery.  Occasionally, pain may persist long after an injury or surgical incision secondary to inflammation and irritation of the intercostal nerve.  The longstanding pain is known as intercostal neuralgia.  An intercostal nerve block is preformed to eliminate pain either temporarily or permanently.  A small needle is placed below the rib and local anesthetic (like Novocaine) and possibly steroid is injected.  Usually 2-4 intercostal nerves are blocked at a time depending on the problem.  The patient will experience a slight "pin-prick" sensation for each injection.  Shortly thereafter, the strip of skin that is innervated by the blocked intercostal nerve will feel numb.  Persistent pain that is only temporarily relieved with local anesthetic may require a more permanent block. This procedure is called  Cryoneurolysis and entails placing a small probe beneath the rib to freeze the nerve.  Conditions that may be treated by intercostal nerve blocks:   Rib fractures  Longstanding pain from surgery of the chest or abdomen (intercostal neuralgia)  Pain from chest tubes  Pain from trauma to the chest  Preparation for the injections:  1. Do not eat any solid food or dairy products within 6 hours of your appointment. 2. You may drink clear liquids up to 2 hours before appointment.  Clear liquids include water, black coffee, juice or soda.  No milk or cream please. 3. You may take your regular medication, including pain medications, with a sip of water before your appointment.  Diabetics should hold regular insulin (if take separately) and take 1/2 normal NPH dose the morning of the procedure.   Carry some sugar containing items with you to your appointment. 4. A driver must accompany you and be prepared to drive you home after your procedure. 5. Bring all your current medications with you. 6. An IV may be inserted and sedation may be given at the discretion of the physician. 7. A blood pressure cuff, EKG and other monitors will often be applied during the procedure.  Some patients may need to have extra oxygen administered for a short period. 8. You will be asked to provide medical information, including your allergies, prior to the procedure.  We must know immediately if you are taking blood thinners (like Coumadin/Warfarin) or if you are allergic to IV iodine contrast (dye). We must know if you could possible be pregnant.  Possible side-effects:   Bleeding from needle site  Infection (rare)  Nerve injury (rare)  Numbness & tingling of skin  Collapsed lung requiring chest tube (rare)  Local anesthetic toxicity (rare)  Light-headedness (temporary)  Pain at injection site (several days)  Decreased blood pressure (temporary)  Shortness of breath  Jittery/shaking sensation  (temporary)  Call if you experience:   Difficulty breathing or hives (go directly to the emergency room)  Redness, inflammation or drainage at the injection site  Severe pain at the site of the injection  Any new symptoms which are concerning   Please note:  Your pain may subside immediately but may return several hours after the injection.  Often, more than one injection is required to reduce the pain. Also, if several temporary blocks with local anesthetic are ineffective, a more permanent block with cryolysis may  be necessary.  This will be discussed with you should this be the case.  If you have any questions, please call [phone] [facility]

## 2015-04-13 DIAGNOSIS — J018 Other acute sinusitis: Secondary | ICD-10-CM | POA: Diagnosis not present

## 2015-04-13 DIAGNOSIS — M222X2 Patellofemoral disorders, left knee: Secondary | ICD-10-CM | POA: Diagnosis not present

## 2015-04-13 DIAGNOSIS — M222X1 Patellofemoral disorders, right knee: Secondary | ICD-10-CM | POA: Diagnosis not present

## 2015-04-17 ENCOUNTER — Ambulatory Visit: Payer: Medicare Other | Admitting: Pain Medicine

## 2015-04-25 ENCOUNTER — Other Ambulatory Visit: Payer: Self-pay | Admitting: Pain Medicine

## 2015-04-26 ENCOUNTER — Ambulatory Visit: Payer: Medicare Other | Attending: Pain Medicine | Admitting: Pain Medicine

## 2015-04-26 ENCOUNTER — Encounter: Payer: Self-pay | Admitting: Pain Medicine

## 2015-04-26 VITALS — BP 144/74 | HR 70 | Temp 97.9°F | Resp 16 | Ht 61.0 in | Wt 95.0 lb

## 2015-04-26 DIAGNOSIS — M5137 Other intervertebral disc degeneration, lumbosacral region: Secondary | ICD-10-CM

## 2015-04-26 DIAGNOSIS — M5136 Other intervertebral disc degeneration, lumbar region: Secondary | ICD-10-CM | POA: Insufficient documentation

## 2015-04-26 DIAGNOSIS — S4382XD Sprain of other specified parts of left shoulder girdle, subsequent encounter: Secondary | ICD-10-CM

## 2015-04-26 DIAGNOSIS — G43109 Migraine with aura, not intractable, without status migrainosus: Secondary | ICD-10-CM

## 2015-04-26 DIAGNOSIS — M5030 Other cervical disc degeneration, unspecified cervical region: Secondary | ICD-10-CM | POA: Insufficient documentation

## 2015-04-26 DIAGNOSIS — G588 Other specified mononeuropathies: Secondary | ICD-10-CM

## 2015-04-26 DIAGNOSIS — M546 Pain in thoracic spine: Secondary | ICD-10-CM | POA: Diagnosis present

## 2015-04-26 DIAGNOSIS — M5134 Other intervertebral disc degeneration, thoracic region: Secondary | ICD-10-CM | POA: Diagnosis not present

## 2015-04-26 DIAGNOSIS — M5481 Occipital neuralgia: Secondary | ICD-10-CM

## 2015-04-26 DIAGNOSIS — S46812D Strain of other muscles, fascia and tendons at shoulder and upper arm level, left arm, subsequent encounter: Secondary | ICD-10-CM

## 2015-04-26 DIAGNOSIS — M160 Bilateral primary osteoarthritis of hip: Secondary | ICD-10-CM

## 2015-04-26 DIAGNOSIS — R0782 Intercostal pain: Secondary | ICD-10-CM | POA: Diagnosis not present

## 2015-04-26 DIAGNOSIS — M4186 Other forms of scoliosis, lumbar region: Secondary | ICD-10-CM | POA: Insufficient documentation

## 2015-04-26 MED ORDER — TRIAMCINOLONE ACETONIDE 40 MG/ML IJ SUSP
INTRAMUSCULAR | Status: AC
  Administered 2015-04-26: 40 mg
  Filled 2015-04-26: qty 1

## 2015-04-26 MED ORDER — FENTANYL CITRATE (PF) 100 MCG/2ML IJ SOLN
INTRAMUSCULAR | Status: AC
  Administered 2015-04-26: 100 ug via INTRAVENOUS
  Filled 2015-04-26: qty 2

## 2015-04-26 MED ORDER — ORPHENADRINE CITRATE 30 MG/ML IJ SOLN
INTRAMUSCULAR | Status: AC
  Filled 2015-04-26: qty 2

## 2015-04-26 MED ORDER — FENTANYL CITRATE (PF) 100 MCG/2ML IJ SOLN
100.0000 ug | Freq: Once | INTRAMUSCULAR | Status: AC
  Administered 2015-04-26: 100 ug via INTRAVENOUS

## 2015-04-26 MED ORDER — LACTATED RINGERS IV SOLN: 1000.0000 mL | INTRAVENOUS | Status: DC

## 2015-04-26 MED ORDER — TRIAMCINOLONE ACETONIDE 40 MG/ML IJ SUSP
40.0000 mg | Freq: Once | INTRAMUSCULAR | Status: AC
  Administered 2015-04-26: 40 mg

## 2015-04-26 MED ORDER — BUPIVACAINE HCL (PF) 0.5 % IJ SOLN
INTRAMUSCULAR | Status: AC
  Filled 2015-04-26: qty 30

## 2015-04-26 MED ORDER — LIDOCAINE HCL (PF) 1 % IJ SOLN: 10.0000 mL | Freq: Once | INTRAMUSCULAR | Status: DC

## 2015-04-26 MED ORDER — BUPIVACAINE HCL (PF) 0.5 % IJ SOLN
30.0000 mL | Freq: Once | INTRAMUSCULAR | Status: AC
Start: 2015-04-26 — End: 2015-04-26
  Administered 2015-04-26: 12:00:00

## 2015-04-26 MED ORDER — MIDAZOLAM HCL 5 MG/5ML IJ SOLN
INTRAMUSCULAR | Status: AC
  Filled 2015-04-26: qty 5

## 2015-04-26 MED ORDER — ORPHENADRINE CITRATE 30 MG/ML IJ SOLN
60.0000 mg | Freq: Once | INTRAMUSCULAR | Status: AC
  Administered 2015-04-26: 12:00:00 via INTRAMUSCULAR

## 2015-04-26 MED ORDER — DIAZEPAM 5 MG PO TABS: ORAL_TABLET | ORAL | Status: DC

## 2015-04-26 MED ORDER — OXYCODONE HCL 5 MG PO TABS: ORAL_TABLET | ORAL | Status: DC

## 2015-04-26 NOTE — Progress Notes (Signed)
   Subjective:    Patient ID: Nelta Numbers, female    DOB: Jan 14, 1971, 44 y.o.   MRN: [id number]  HPI  PROCEDURE PERFORMED:  Intercostal nerve block.  HISTORY OF PRESENT ILLNESS:  The patient is a 44 y.o. female who returns to the [facility] for further evaluation and treatment of pain involving the midportion of the back. The patient is status post surgery of the thoracic or lumbar region with insertion of rods for severe kyphoscoliosis. 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 We will proceed with intercostal nerve blocks and attempt to decrease severity of patient's symptoms, minimize progression of symptoms, decrease severity of muscle spasms, and avoid need for more involved treatment The risks, benefits, and expectations of the procedure have been discussed and explained to the patient who was understanding and in agreement with suggested treatment plan. We will proceed with interventional treatment as discussed.   DESCRIPTION OF PROCEDURE: Intercostal nerve block with IV Versed, IV fentanyl conscious sedation, EKG, blood pressure, pulse, and pulse oximetry monitoring. The procedure was performed with the patient in the prone position under fluoroscopic guidance.   Intercostal nerve block, left side: With the patient in the prone position, Betadine prep of proposed entry site was performed under fluoroscopic guidance with AP view of the thoracic spine. Under fluoroscopic guidance, a 22 -gauge needle was inserted to contact bone of the 12th rib on the left side after which the needle was repositioned at the inferior border of the    rib on the left side under fluoroscopic guidance. Following documentation of needle placement, at the inferior border of the 12th rib on the left  side and negative aspiration, a total of 3 mL of 0.25% bupivacaine with Kenalog was injected for left side for    rib intercostal nerve block.   INTERCOSTAL NERVE BLOCKS AT T11, T10, T9, and T8, and T7 LEVELS: The procedure was performed at these levels as was performed at the previous level, T12, utilizing the same technique and under fluoroscopic guidance  A total of 10 mg Kenalog was utilized for the procedure.   PLAN:   1. Medications: We will continue presently prescribed medications oxycodone and Valium 2. The patient is to follow up with primary care physician for further evaluation of blood pressure and general medical condition as discussed. 3. Surgical evaluation. Patient will undergo follow-up evaluation with Dr.Hsu as discussed  4. Neurological evaluation. May further neurological evaluation as discussed 5. May consider the patient for additional studies pending response to treatment and follow-up evaluation. 6. May consider radiofrequency procedures, implantation type procedures and other treatment pending response to treatment and follow-up evaluation. 7. The patient has been advised to adhere to proper body mechanics and to call the [facility] Management Center prior to scheduled return appointment should there be significant change in condition or have other concerns regarding condition prior to scheduled return appointment.  The patient is understanding and in agreement with suggested treatment plan.        Review of Systems     Objective:   Physical Exam        Assessment & Plan:

## 2015-04-26 NOTE — Patient Instructions (Addendum)
PLAN   Continue present medication  diazepam and oxycodone  F/U PCP for evaliation of  BP and general medical  condition  F/U surgical evaluation as planned  F/U neurological evaluation as planned with Dr.Baute as discussed   May consider radiofrequency rhizolysis or intraspinal procedures pending response to present treatment and F/U evaluation Pain Management Discharge Instructions  General Discharge Instructions :  If you need to reach your doctor call: Monday-Friday 8:00 am - 4:00 pm at [phone] or toll free [phone].  After clinic hours [phone] to have operator reach doctor.  Bring all of your medication bottles to all your appointments in the pain clinic.  To cancel or reschedule your appointment with Pain Management please remember to call 24 hours in advance to avoid a fee.  Refer to the educational materials which you have been given on: General Risks, I had my Procedure. Discharge Instructions, Post Sedation.  Post Procedure Instructions:  The drugs you were given will stay in your system until tomorrow, so for the next 24 hours you should not drive, make any legal decisions or drink any alcoholic beverages.  You may eat anything you prefer, but it is better to start with liquids then soups and crackers, and gradually work up to solid foods.  Please notify your doctor immediately if you have any unusual bleeding, trouble breathing or pain that is not related to your normal pain.  Depending on the type of procedure that was done, some parts of your body may feel week and/or numb.  This usually clears up by tonight or the next day.  Walk with the use of an assistive device or accompanied by an adult for the 24 hours.  You may use ice on the affected area for the first 24 hours.  Put ice in a Ziploc bag and cover with a towel and place against area 15 minutes on 15 minutes off.  You may switch to heat after 24 hours.GENERAL RISKS AND COMPLICATIONS  What are  the risk, side effects and possible complications? Generally speaking, most procedures are safe.  However, with any procedure there are risks, side effects, and the possibility of complications.  The risks and complications are dependent upon the sites that are lesioned, or the type of nerve block to be performed.  The closer the procedure is to the spine, the more serious the risks are.  Great care is taken when placing the radio frequency needles, block needles or lesioning probes, but sometimes complications can occur. 1. Infection: Any time there is an injection through the skin, there is a risk of infection.  This is why sterile conditions are used for these blocks.  There are four possible types of infection. 1. Localized skin infection. 2. Central Nervous System Infection-This can be in the form of Meningitis, which can be deadly. 3. Epidural Infections-This can be in the form of an epidural abscess, which can cause pressure inside of the spine, causing compression of the spinal cord with subsequent paralysis. This would require an emergency surgery to decompress, and there are no guarantees that the patient would recover from the paralysis. 4. Discitis-This is an infection of the intervertebral discs.  It occurs in about 1% of discography procedures.  It is difficult to treat and it may lead to surgery.        2. Pain: the needles have to go through skin and soft tissues, will cause soreness.       3. Damage to internal structures:  The nerves  to be lesioned may be near blood vessels or    other nerves which can be potentially damaged.       4. Bleeding: Bleeding is more common if the patient is taking blood thinners such as  aspirin, Coumadin, Ticiid, Plavix, etc., or if he/she have some genetic predisposition  such as hemophilia. Bleeding into the spinal canal can cause compression of the spinal  cord with subsequent paralysis.  This would require an emergency surgery to  decompress and there  are no guarantees that the patient would recover from the  paralysis.       5. Pneumothorax:  Puncturing of a lung is a possibility, every time a needle is introduced in  the area of the chest or upper back.  Pneumothorax refers to free air around the  collapsed lung(s), inside of the thoracic cavity (chest cavity).  Another two possible  complications related to a similar event would include: Hemothorax and Chylothorax.   These are variations of the Pneumothorax, where instead of air around the collapsed  lung(s), you may have blood or chyle, respectively.       6. Spinal headaches: They may occur with any procedures in the area of the spine.       7. Persistent CSF (Cerebro-Spinal Fluid) leakage: This is a rare problem, but may occur  with prolonged intrathecal or epidural catheters either due to the formation of a fistulous  track or a dural tear.       8. Nerve damage: By working so close to the spinal cord, there is always a possibility of  nerve damage, which could be as serious as a permanent spinal cord injury with  paralysis.       9. Death:  Although rare, severe deadly allergic reactions known as "Anaphylactic  reaction" can occur to any of the medications used.      10. Worsening of the symptoms:  We can always make thing worse.  What are the chances of something like this happening? Chances of any of this occuring are extremely low.  By statistics, you have more of a chance of getting killed in a motor vehicle accident: while driving to the hospital than any of the above occurring .  Nevertheless, you should be aware that they are possibilities.  In general, it is similar to taking a shower.  Everybody knows that you can slip, hit your head and get killed.  Does that mean that you should not shower again?  Nevertheless always keep in mind that statistics do not mean anything if you happen to be on the wrong side of them.  Even if a procedure has a 1 (one) in a 1,000,000 (million) chance of  going wrong, it you happen to be that one..Also, keep in mind that by statistics, you have more of a chance of having something go wrong when taking medications.  Who should not have this procedure? If you are on a blood thinning medication (e.g. Coumadin, Plavix, see list of "Blood Thinners"), or if you have an active infection going on, you should not have the procedure.  If you are taking any blood thinners, please inform your physician.  How should I prepare for this procedure?  Do not eat or drink anything at least six hours prior to the procedure.  Bring a driver with you .  It cannot be a taxi.  Come accompanied by an adult that can drive you back, and that is strong enough to help you if  your legs get weak or numb from the local anesthetic.  Take all of your medicines the morning of the procedure with just enough water to swallow them.  If you have diabetes, make sure that you are scheduled to have your procedure done first thing in the morning, whenever possible.  If you have diabetes, take only half of your insulin dose and notify our nurse that you have done so as soon as you arrive at the clinic.  If you are diabetic, but only take blood sugar pills (oral hypoglycemic), then do not take them on the morning of your procedure.  You may take them after you have had the procedure.  Do not take aspirin or any aspirin-containing medications, at least eleven (11) days prior to the procedure.  They may prolong bleeding.  Wear loose fitting clothing that may be easy to take off and that you would not mind if it got stained with Betadine or blood.  Do not wear any jewelry or perfume  Remove any nail coloring.  It will interfere with some of our monitoring equipment.  NOTE: Remember that this is not meant to be interpreted as a complete list of all possible complications.  Unforeseen problems may occur.  BLOOD THINNERS The following drugs contain aspirin or other products, which can  cause increased bleeding during surgery and should not be taken for 2 weeks prior to and 1 week after surgery.  If you should need take something for relief of minor pain, you may take acetaminophen which is found in Tylenol,m Datril, Anacin-3 and Panadol. It is not blood thinner. The products listed below are.  Do not take any of the products listed below in addition to any listed on your instruction sheet.  A.P.C or A.P.C with Codeine Codeine Phosphate Capsules #3 Ibuprofen Ridaura  ABC compound Congesprin Imuran rimadil  Advil Cope Indocin Robaxisal  Alka-Seltzer Effervescent Pain Reliever and Antacid Coricidin or Coricidin-D  Indomethacin Rufen  Alka-Seltzer plus Cold Medicine Cosprin Ketoprofen S-A-C Tablets  Anacin Analgesic Tablets or Capsules Coumadin Korlgesic Salflex  Anacin Extra Strength Analgesic tablets or capsules CP-2 Tablets Lanoril Salicylate  Anaprox Cuprimine Capsules Levenox Salocol  Anexsia-D Dalteparin Magan Salsalate  Anodynos Darvon compound Magnesium Salicylate Sine-off  Ansaid Dasin Capsules Magsal Sodium Salicylate  Anturane Depen Capsules Marnal Soma  APF Arthritis pain formula Dewitt's Pills Measurin Stanback  Argesic Dia-Gesic Meclofenamic Sulfinpyrazone  Arthritis Bayer Timed Release Aspirin Diclofenac Meclomen Sulindac  Arthritis pain formula Anacin Dicumarol Medipren Supac  Analgesic (Safety coated) Arthralgen Diffunasal Mefanamic Suprofen  Arthritis Strength Bufferin Dihydrocodeine Mepro Compound Suprol  Arthropan liquid Dopirydamole Methcarbomol with Aspirin Synalgos  ASA tablets/Enseals Disalcid Micrainin Tagament  Ascriptin Doan's Midol Talwin  Ascriptin A/D Dolene Mobidin Tanderil  Ascriptin Extra Strength Dolobid Moblgesic Ticlid  Ascriptin with Codeine Doloprin or Doloprin with Codeine Momentum Tolectin  Asperbuf Duoprin Mono-gesic Trendar  Aspergum Duradyne Motrin or Motrin IB Triminicin  Aspirin plain, buffered or enteric coated Durasal  Myochrisine Trigesic  Aspirin Suppositories Easprin Nalfon Trillsate  Aspirin with Codeine Ecotrin Regular or Extra Strength Naprosyn Uracel  Atromid-S Efficin Naproxen Ursinus  Auranofin Capsules Elmiron Neocylate Vanquish  Axotal Emagrin Norgesic Verin  Azathioprine Empirin or Empirin with Codeine Normiflo Vitamin E  Azolid Emprazil Nuprin Voltaren  Bayer Aspirin plain, buffered or children's or timed BC Tablets or powders Encaprin Orgaran Warfarin Sodium  Buff-a-Comp Enoxaparin Orudis Zorpin  Buff-a-Comp with Codeine Equegesic Os-Cal-Gesic   Buffaprin Excedrin plain, buffered or Extra Strength Oxalid   Bufferin Arthritis  Strength Feldene Oxphenbutazone   Bufferin plain or Extra Strength Feldene Capsules Oxycodone with Aspirin   Bufferin with Codeine Fenoprofen Fenoprofen Pabalate or Pabalate-SF   Buffets II Flogesic Panagesic   Buffinol plain or Extra Strength Florinal or Florinal with Codeine Panwarfarin   Buf-Tabs Flurbiprofen Penicillamine   Butalbital Compound Four-way cold tablets Penicillin   Butazolidin Fragmin Pepto-Bismol   Carbenicillin Geminisyn Percodan   Carna Arthritis Reliever Geopen Persantine   Carprofen Gold's salt Persistin   Chloramphenicol Goody's Phenylbutazone   Chloromycetin Haltrain Piroxlcam   Clmetidine heparin Plaquenil   Cllnoril Hyco-pap Ponstel   Clofibrate Hydroxy chloroquine Propoxyphen         Before stopping any of these medications, be sure to consult the physician who ordered them.  Some, such as Coumadin (Warfarin) are ordered to prevent or treat serious conditions such as "deep thrombosis", "pumonary embolisms", and other heart problems.  The amount of time that you may need off of the medication may also vary with the medication and the reason for which you were taking it.  If you are taking any of these medications, please make sure you notify your pain physician before you undergo any procedures.          

## 2015-04-27 ENCOUNTER — Telehealth: Payer: Self-pay | Admitting: *Deleted

## 2015-04-27 DIAGNOSIS — M170 Bilateral primary osteoarthritis of knee: Secondary | ICD-10-CM | POA: Diagnosis not present

## 2015-04-27 NOTE — Telephone Encounter (Signed)
Left voicemail for patient to call our office with any problems or concerns.

## 2015-05-02 ENCOUNTER — Ambulatory Visit: Payer: Medicare Other | Admitting: Pain Medicine

## 2015-06-01 ENCOUNTER — Encounter: Payer: Self-pay | Admitting: Pain Medicine

## 2015-06-01 ENCOUNTER — Ambulatory Visit: Payer: Medicare Other | Attending: Pain Medicine | Admitting: Pain Medicine

## 2015-06-01 VITALS — BP 121/69 | HR 87 | Temp 97.7°F | Resp 16 | Ht 61.0 in | Wt 94.0 lb

## 2015-06-01 DIAGNOSIS — M5134 Other intervertebral disc degeneration, thoracic region: Secondary | ICD-10-CM | POA: Diagnosis not present

## 2015-06-01 DIAGNOSIS — G43109 Migraine with aura, not intractable, without status migrainosus: Secondary | ICD-10-CM

## 2015-06-01 DIAGNOSIS — M4184 Other forms of scoliosis, thoracic region: Secondary | ICD-10-CM | POA: Diagnosis not present

## 2015-06-01 DIAGNOSIS — M5030 Other cervical disc degeneration, unspecified cervical region: Secondary | ICD-10-CM

## 2015-06-01 DIAGNOSIS — S4382XD Sprain of other specified parts of left shoulder girdle, subsequent encounter: Secondary | ICD-10-CM

## 2015-06-01 DIAGNOSIS — S46812D Strain of other muscles, fascia and tendons at shoulder and upper arm level, left arm, subsequent encounter: Secondary | ICD-10-CM

## 2015-06-01 DIAGNOSIS — Z9889 Other specified postprocedural states: Secondary | ICD-10-CM | POA: Insufficient documentation

## 2015-06-01 DIAGNOSIS — M5137 Other intervertebral disc degeneration, lumbosacral region: Secondary | ICD-10-CM

## 2015-06-01 DIAGNOSIS — M47817 Spondylosis without myelopathy or radiculopathy, lumbosacral region: Secondary | ICD-10-CM | POA: Diagnosis not present

## 2015-06-01 DIAGNOSIS — M5481 Occipital neuralgia: Secondary | ICD-10-CM

## 2015-06-01 DIAGNOSIS — R0782 Intercostal pain: Secondary | ICD-10-CM | POA: Diagnosis not present

## 2015-06-01 DIAGNOSIS — M546 Pain in thoracic spine: Secondary | ICD-10-CM | POA: Diagnosis present

## 2015-06-01 DIAGNOSIS — M170 Bilateral primary osteoarthritis of knee: Secondary | ICD-10-CM | POA: Insufficient documentation

## 2015-06-01 DIAGNOSIS — M160 Bilateral primary osteoarthritis of hip: Secondary | ICD-10-CM | POA: Diagnosis not present

## 2015-06-01 DIAGNOSIS — M533 Sacrococcygeal disorders, not elsewhere classified: Secondary | ICD-10-CM | POA: Diagnosis not present

## 2015-06-01 DIAGNOSIS — M4186 Other forms of scoliosis, lumbar region: Secondary | ICD-10-CM | POA: Insufficient documentation

## 2015-06-01 DIAGNOSIS — R51 Headache: Secondary | ICD-10-CM | POA: Diagnosis present

## 2015-06-01 DIAGNOSIS — M5136 Other intervertebral disc degeneration, lumbar region: Secondary | ICD-10-CM | POA: Insufficient documentation

## 2015-06-01 DIAGNOSIS — M5416 Radiculopathy, lumbar region: Secondary | ICD-10-CM | POA: Diagnosis not present

## 2015-06-01 DIAGNOSIS — G588 Other specified mononeuropathies: Secondary | ICD-10-CM

## 2015-06-01 MED ORDER — DIAZEPAM 5 MG PO TABS: ORAL_TABLET | ORAL | Status: DC

## 2015-06-01 MED ORDER — OXYCODONE HCL 5 MG PO TABS: ORAL_TABLET | ORAL | Status: DC

## 2015-06-01 NOTE — Progress Notes (Signed)
   Subjective:    Patient ID: Sharon Welch, female    DOB: 16-Aug-1970, 44 y.o.   MRN: [id number]  HPI   Patient is 44 year old female who returns to pain management for further evaluation and treatment of pain patient is a history of headaches as well as pain involving the thoracic oh lumbar and lower extremity region. Patient states the predominant pain involves the lower back and lower extremity region of the pain is aggravated by standing walking twisting turning maneuvers. We discussed patient's condition including medications and May consider interventional treatment at time of return appointment as discussed and as explained to patient on today's visit. The patient will undergo further evaluation as discussed including neurological reevaluation surgical evaluation and psych evaluation informed patient is to have patient undergo continued psych evaluation and also discussed patient's present medications and their being consideration being given to modifying medications as discussed and as explained to patient  Physical examination reveal patient to be with tenderness to palpation of paraspinal muscles and cervical region cervical facet region palpation which reproduces pain of moderate to moderately severe degree there was moderate to moderately severe tends to palpation of the splenius capitis and occipitalis regions. Palpation of the acromioclavicular and glenohumeral joint region was attends to palpation of moderate degree. There was well-healed surgical scar of the thoracic oh lumbar region without increased warmth and erythema in the region of the scar palpation of the lumbar paraspinal musculature region lumbar facet region was attends to palpation of moderate moderately severe degree with palpation over the PSIS and PII S regions reproducing moderately severe discomfort. There was tenderness to palpation of the greater trochanteric region iliotibial band region of moderate degree and no crepitus  of the thoracic region was noted. Straight leg raising was tolerates approximately 20 without increased pain with dorsiflexion noted. There was negative clonus negative Homans. Abdomen was nontender with no costovertebral angle tenderness noted.   Degenerative disc disease thoracic spine Thoracic facet syndrome Status post Harrington rods thoracic and lumbar regions for severe scoliosis of the thoracolumbar spine  Degenerative disc disease lumbar spine Lumbar facet syndrome Status post Harrington rods for severe scoliosis of the thoracic and lumbar regions  Degenerative joint disease of hips  Degenerative joint disease of knees  Sacroiliac joint dysfunction        PLAN  Continue present medication morphine sulfate  F/U PCP Dr.Miles for evaliation of  BP and general medical  condition Sees Dr. Lennox Grumbles regarding elevated blood pressure as discussed today  F/U surgical evaluation. May consider pending follow-up evaluations. Patient prefers to avoid surgical evaluation at this time   F/U neurological evaluation. May consider pending follow-up evaluations  Psych follow-up evaluations as discussed  May consider radiofrequency rhizolysis or intraspinal procedures pending response to present treatment and F/U evaluation   Patient to call Pain Management Center should patient have concerns prior to scheduled return appointment.       Review of Systems     Objective:   Physical Exam        Assessment & Plan:

## 2015-06-01 NOTE — Patient Instructions (Addendum)
PLAN   Continue present medication  diazepam and oxycodone  F/U PCP for evaliation of  BP and general medical  condition  F/U surgical evaluation as planned Synvisc injections of knee  F/U neurological evaluation as planned with Dr.Baute as discussed   Psych eval as discussed . Asked receptionist date of your psych eval  May consider radiofrequency rhizolysis or intraspinal procedures pending response to present treatment and F/U evaluation   Patient to call Pain Management Center should patient have concerns prior to scheduled return appointment.

## 2015-06-01 NOTE — Progress Notes (Signed)
Safety precautions to be maintained throughout the outpatient stay will include: orient to surroundings, keep bed in low position, maintain call bell within reach at all times, provide assistance with transfer out of bed and ambulation.  

## 2015-06-06 DIAGNOSIS — M170 Bilateral primary osteoarthritis of knee: Secondary | ICD-10-CM | POA: Diagnosis not present

## 2015-06-28 ENCOUNTER — Ambulatory Visit: Payer: Medicare Other | Attending: Pain Medicine | Admitting: Pain Medicine

## 2015-06-28 ENCOUNTER — Encounter: Payer: Self-pay | Admitting: Pain Medicine

## 2015-06-28 ENCOUNTER — Other Ambulatory Visit: Payer: Self-pay | Admitting: Pain Medicine

## 2015-06-28 VITALS — BP 120/71 | HR 84 | Temp 97.6°F | Resp 13 | Ht 61.0 in | Wt 93.0 lb

## 2015-06-28 DIAGNOSIS — M5137 Other intervertebral disc degeneration, lumbosacral region: Secondary | ICD-10-CM

## 2015-06-28 DIAGNOSIS — M160 Bilateral primary osteoarthritis of hip: Secondary | ICD-10-CM | POA: Insufficient documentation

## 2015-06-28 DIAGNOSIS — R51 Headache: Secondary | ICD-10-CM | POA: Diagnosis not present

## 2015-06-28 DIAGNOSIS — M4185 Other forms of scoliosis, thoracolumbar region: Secondary | ICD-10-CM | POA: Diagnosis not present

## 2015-06-28 DIAGNOSIS — S46812D Strain of other muscles, fascia and tendons at shoulder and upper arm level, left arm, subsequent encounter: Secondary | ICD-10-CM

## 2015-06-28 DIAGNOSIS — M5416 Radiculopathy, lumbar region: Secondary | ICD-10-CM | POA: Diagnosis not present

## 2015-06-28 DIAGNOSIS — G43909 Migraine, unspecified, not intractable, without status migrainosus: Secondary | ICD-10-CM | POA: Insufficient documentation

## 2015-06-28 DIAGNOSIS — M47817 Spondylosis without myelopathy or radiculopathy, lumbosacral region: Secondary | ICD-10-CM | POA: Diagnosis not present

## 2015-06-28 DIAGNOSIS — M5136 Other intervertebral disc degeneration, lumbar region: Secondary | ICD-10-CM | POA: Diagnosis not present

## 2015-06-28 DIAGNOSIS — M542 Cervicalgia: Secondary | ICD-10-CM | POA: Diagnosis present

## 2015-06-28 DIAGNOSIS — M5030 Other cervical disc degeneration, unspecified cervical region: Secondary | ICD-10-CM

## 2015-06-28 DIAGNOSIS — M170 Bilateral primary osteoarthritis of knee: Secondary | ICD-10-CM | POA: Diagnosis not present

## 2015-06-28 DIAGNOSIS — Z9889 Other specified postprocedural states: Secondary | ICD-10-CM | POA: Diagnosis not present

## 2015-06-28 DIAGNOSIS — G588 Other specified mononeuropathies: Secondary | ICD-10-CM

## 2015-06-28 DIAGNOSIS — M533 Sacrococcygeal disorders, not elsewhere classified: Secondary | ICD-10-CM | POA: Diagnosis not present

## 2015-06-28 DIAGNOSIS — M5134 Other intervertebral disc degeneration, thoracic region: Secondary | ICD-10-CM | POA: Insufficient documentation

## 2015-06-28 DIAGNOSIS — M5481 Occipital neuralgia: Secondary | ICD-10-CM

## 2015-06-28 DIAGNOSIS — M546 Pain in thoracic spine: Secondary | ICD-10-CM | POA: Diagnosis present

## 2015-06-28 DIAGNOSIS — R0782 Intercostal pain: Secondary | ICD-10-CM | POA: Diagnosis not present

## 2015-06-28 DIAGNOSIS — G43109 Migraine with aura, not intractable, without status migrainosus: Secondary | ICD-10-CM

## 2015-06-28 DIAGNOSIS — S4382XD Sprain of other specified parts of left shoulder girdle, subsequent encounter: Secondary | ICD-10-CM

## 2015-06-28 MED ORDER — DIAZEPAM 5 MG PO TABS: ORAL_TABLET | ORAL | Status: DC

## 2015-06-28 MED ORDER — OXYCODONE HCL 5 MG PO TABS: ORAL_TABLET | ORAL | Status: DC

## 2015-06-28 NOTE — Progress Notes (Signed)
Safety precautions to be maintained throughout the outpatient stay will include: orient to surroundings, keep bed in low position, maintain call bell within reach at all times, provide assistance with transfer out of bed and ambulation.  

## 2015-06-28 NOTE — Progress Notes (Signed)
Subjective:    Patient ID: Sharon Welch, female    DOB: [birth year], 45 y.o.   MRN: [id number]  HPI  The patient is a 46 year old female who returns to pain management for further evaluation and treatment of pain involving headaches as well as pain of the cervical thoracic and upper and lower extremity regions. The patient is without any significant change in condition at this time and appears to be with pain fairly well-controlled. We discussed patient undergoing psych evaluation as well as follow-up evaluation with neurologist and neurosurgeon. At the present time we will continue medications as prescribed and we'll consider modification of treatment regimen pending follow-up evaluation. The patient admits to spasms occurring in the mid and lower back region without significant spasms interfering with activities of daily living at this time. The patient is with lower back lower extremity pain is well. We will await results of psych evaluation and May consider modification of treatment regimen pending follow-up evaluation. The patient will also undergo follow-up evaluation with neurologist as discussed and as to call pain management prior to scheduled return appointment should there be significant change in condition. The patient agreed to suggested treatment plan. The patient will continue oxycodone and diazepam at this time as prescribed.    Review of Systems     Objective:   Physical Exam  There was tends to palpation of the splenius capitis and occipitalis musculature regions of mild to moderate degree. The patient appeared to be with unremarkable Spurling's maneuver. There was tenderness to palpation of the trapezius levator scapula and rhomboid musculature regions a moderate degree. There was moderate tends to palpation of the acromioclavicular and glenohumeral joint region. The patient appeared to be with bilaterally equal grip strength and Tinel and Phalen's maneuver were without increased  pain of significant degree. There was tenderness to palpation and significant muscle spasms of the lower thoracic paraspinal musculature region without crepitus of the thoracic region noted. Palpation over the lumbar paraspinal musculatures and lumbar facet region was attends to palpation of moderate degree. Lateral bending rotation extension and palpation of the lumbar facets reproduce moderate discomfort. There was moderate tenderness to palpation of the PSIS PII S region as well as the gluteal and piriformis musculature region. Palpation of the greater trochanteric region and iliotibial band region reproduced mild discomfort. Straight leg raising was tolerates approximately 20 without increased pain with dorsiflexion noted. EHL strength was slightly decreased without sensory deficit or dermatomal distribution detected. There was negative clonus negative Homans. Abdomen nontender with no costovertebral tenderness noted.      Assessment & Plan:    Degenerative disc disease thoracic spine Thoracic facet syndrome Status post Harrington rods thoracic and lumbar regions for severe scoliosis of the thoracolumbar spine  Degenerative disc disease lumbar spine Lumbar facet syndrome Status post Harrington rods for severe scoliosis of the thoracic and lumbar regions  Degenerative disc disease cervical spine  Cervical facet syndrome  Bilateral occipital neuralgia  Migraine headaches  Degenerative joint disease of hips  Degenerative joint disease of knees  Sacroiliac joint dysfunction    PLAN   Continue present medication  diazepam and oxycodone  F/U PCP for evaliation of  BP and general medical  condition  F/U surgical evaluation as planned Synvisc injections of knee  F/U neurological evaluation as planned with Dr.Baute as we discussed today  Psych eval as discussed . Proceed with psych evaluation as we discussed today  May consider radiofrequency rhizolysis or intraspinal  procedures pending response to present  treatment and F/U evaluation   Patient to call Pain Management Center should patient have concerns prior to scheduled return appointment.

## 2015-06-28 NOTE — Patient Instructions (Addendum)
PLAN   Continue present medication  diazepam and oxycodone  F/U PCP for evaliation of  BP and general medical  condition  F/U surgical evaluation as planned Synvisc injections of knee  F/U neurological evaluation as planned with Dr.Baute as we discussed today  Psych eval as discussed . Proceed with psych evaluation as we discussed today  May consider radiofrequency rhizolysis or intraspinal procedures pending response to present treatment and F/U evaluation   Patient to call Pain Management Center should patient have concerns prior to scheduled return appointment.

## 2015-06-29 DIAGNOSIS — Z7982 Long term (current) use of aspirin: Secondary | ICD-10-CM | POA: Diagnosis not present

## 2015-06-29 DIAGNOSIS — G8929 Other chronic pain: Secondary | ICD-10-CM | POA: Diagnosis not present

## 2015-06-29 DIAGNOSIS — G4089 Other seizures: Secondary | ICD-10-CM | POA: Diagnosis not present

## 2015-06-29 DIAGNOSIS — F439 Reaction to severe stress, unspecified: Secondary | ICD-10-CM | POA: Diagnosis not present

## 2015-06-29 DIAGNOSIS — Z79899 Other long term (current) drug therapy: Secondary | ICD-10-CM | POA: Diagnosis not present

## 2015-06-29 DIAGNOSIS — G43709 Chronic migraine without aura, not intractable, without status migrainosus: Secondary | ICD-10-CM | POA: Diagnosis not present

## 2015-07-06 LAB — TOXASSURE SELECT 13 (MW), URINE: PDF: 0

## 2015-07-22 LAB — SPECIMEN STATUS REPORT

## 2015-07-31 ENCOUNTER — Other Ambulatory Visit: Payer: Self-pay | Admitting: Pain Medicine

## 2015-08-01 ENCOUNTER — Ambulatory Visit: Payer: Medicare Other | Attending: Pain Medicine | Admitting: Pain Medicine

## 2015-08-01 ENCOUNTER — Encounter: Payer: Self-pay | Admitting: Pain Medicine

## 2015-08-01 ENCOUNTER — Encounter (INDEPENDENT_AMBULATORY_CARE_PROVIDER_SITE_OTHER): Payer: Self-pay

## 2015-08-01 VITALS — BP 114/63 | HR 96 | Temp 98.5°F | Resp 18 | Ht 61.0 in | Wt 94.0 lb

## 2015-08-01 DIAGNOSIS — S46812D Strain of other muscles, fascia and tendons at shoulder and upper arm level, left arm, subsequent encounter: Secondary | ICD-10-CM

## 2015-08-01 DIAGNOSIS — M533 Sacrococcygeal disorders, not elsewhere classified: Secondary | ICD-10-CM | POA: Diagnosis not present

## 2015-08-01 DIAGNOSIS — M160 Bilateral primary osteoarthritis of hip: Secondary | ICD-10-CM

## 2015-08-01 DIAGNOSIS — Z9889 Other specified postprocedural states: Secondary | ICD-10-CM | POA: Diagnosis not present

## 2015-08-01 DIAGNOSIS — R0782 Intercostal pain: Secondary | ICD-10-CM | POA: Diagnosis not present

## 2015-08-01 DIAGNOSIS — M5481 Occipital neuralgia: Secondary | ICD-10-CM | POA: Diagnosis not present

## 2015-08-01 DIAGNOSIS — G588 Other specified mononeuropathies: Secondary | ICD-10-CM

## 2015-08-01 DIAGNOSIS — R51 Headache: Secondary | ICD-10-CM | POA: Diagnosis not present

## 2015-08-01 DIAGNOSIS — M5030 Other cervical disc degeneration, unspecified cervical region: Secondary | ICD-10-CM | POA: Insufficient documentation

## 2015-08-01 DIAGNOSIS — M542 Cervicalgia: Secondary | ICD-10-CM | POA: Diagnosis present

## 2015-08-01 DIAGNOSIS — M5134 Other intervertebral disc degeneration, thoracic region: Secondary | ICD-10-CM | POA: Insufficient documentation

## 2015-08-01 DIAGNOSIS — M4185 Other forms of scoliosis, thoracolumbar region: Secondary | ICD-10-CM | POA: Diagnosis not present

## 2015-08-01 DIAGNOSIS — G43109 Migraine with aura, not intractable, without status migrainosus: Secondary | ICD-10-CM

## 2015-08-01 DIAGNOSIS — S4382XD Sprain of other specified parts of left shoulder girdle, subsequent encounter: Secondary | ICD-10-CM

## 2015-08-01 DIAGNOSIS — M5137 Other intervertebral disc degeneration, lumbosacral region: Secondary | ICD-10-CM

## 2015-08-01 DIAGNOSIS — M5416 Radiculopathy, lumbar region: Secondary | ICD-10-CM | POA: Diagnosis not present

## 2015-08-01 DIAGNOSIS — M47817 Spondylosis without myelopathy or radiculopathy, lumbosacral region: Secondary | ICD-10-CM | POA: Diagnosis not present

## 2015-08-01 DIAGNOSIS — M5136 Other intervertebral disc degeneration, lumbar region: Secondary | ICD-10-CM | POA: Insufficient documentation

## 2015-08-01 DIAGNOSIS — M546 Pain in thoracic spine: Secondary | ICD-10-CM | POA: Diagnosis present

## 2015-08-01 MED ORDER — OXYCODONE HCL 5 MG PO TABS: ORAL_TABLET | ORAL | Status: DC

## 2015-08-01 MED ORDER — DIAZEPAM 5 MG PO TABS: ORAL_TABLET | ORAL | Status: DC

## 2015-08-01 NOTE — Progress Notes (Signed)
Subjective:    Patient ID: Sharon Welch, female    DOB: February 18, 1971, 45 y.o.   MRN: [id number]  HPI  The patient is a 45 year old female who returns to pain management for further evaluation and treatment of pain involving the region of the neck entire back upper and lower extremity region associated with headaches. The patient is status post surgical intervention of the thoracic oh lumbar region with instrumentation of the thoracic oh lumbar region. The patient is with history of scoliosis and is with severe muscle spasms involving the thoracic oh lumbar region. We discussed patient's condition on today's visit and we'll continue present medications as prescribed. We will proceed with intercostal nerve blocks at time return appointment in attempt to decrease severe muscle spasms of the thoracic region, minimize progression of symptoms, and avoid the need for more involved treatment. The patient will undergo surgical reevaluation as needed and will undergo neurological evaluation as discussed. At the present time patient states the muscle spasms of severely disabling and inferior all activities of daily living as well as ability to obtain restful sleep. Patient denies any trauma change in events of daily living because no changes of the pathology. We will continue presently prescribed medications including diazepam and oxycodone and we'll remain available to consider patient for modification of treatment regimen pending response to present treatment and follow-up evaluation. The cost of nerve blocks at time return appointment as discussed. All were in agreement with suggested treatment plan  Review of Systems     Objective:   Physical Exam  There was tenderness to palpation of paraspinal muscular region cervical region cervical facet region palpation which reproduces moderate discomfort with limited range of motion of the cervical spine. There was tenderness of the splenius capitis and occipitalis  musculature regions of moderate degree. There was moderate muscle spasms noted in the cervical paraspinal musculature region as well as the thoracic paraspinal musculature region with no crepitus of the thoracic region noted. The patient was with limited range of motion of the cervical spine and appeared to be unremarkable Spurling's maneuver. Grip strength was slightly decreased and Tinel and Phalen's maneuver were without increase of pain of significant degree. There was severe muscle spasms involving the trapezius levator scapula and rhomboid musculature regions palpation of these regions reproduced severely disabling pain. The patient was with slightly limited range of motion of the shoulders with mild difficulty performing drop test. Palpation over the lumbar paraspinal musculature and lumbar facet region was with moderate tends to palpation with lateral bending rotation extension and palpation of the lumbar facets reproducing moderate discomfort with moderate to moderately severe muscle spasms of the lower thoracic region and lumbar paraspinal must reason area straight leg raise was tolerates approximately 30 without a definite increased pain with dorsiflexion noted. There was tenderness to the knees noted with negative anterior and posterior drawer signs without ballottement of the patella. Crepitus of the knees were noted. EHL strength was decreased without a definite sensory deficit or dermatomal dystrophy detected. There appeared to be negative clonus negative Homans. Abdomen nontender with no costovertebral tenderness noted.      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 regions for severe scoliosis of the thoracolumbar spine  Degenerative disc disease lumbar spine Lumbar facet syndrome Status post Harrington rods for severe scoliosis of the thoracic and lumbar regions  Degenerative disc  disease cervical spine  Cervical facet  syndrome  Bilateral occipital neuralgia     PLAN   Continue present medication  diazepam and oxycodone  Intercostal nerve blocks to be performed at time of return appointment  F/U PCP for evaliation of  BP and general medical  condition  F/U surgical evaluation as planned and Synvisc injections of knee  F/U neurological evaluation as planned with Dr.Baute as we discussed  Psych eval as discussed .  May consider radiofrequency rhizolysis or intraspinal procedures pending response to present treatment and F/U evaluation   Patient to call Pain Management Center should patient have concerns prior to scheduled return appointment.

## 2015-08-01 NOTE — Patient Instructions (Addendum)
PLAN   Continue present medication  diazepam and oxycodone  Intercostal nerve blocks to be performed at time of return appointment  F/U PCP for evaliation of  BP and general medical  condition  F/U surgical evaluation as planned and Synvisc injections of knee  F/U neurological evaluation as planned with Dr.Baute as we discussed  Psych eval as discussed .  May consider radiofrequency rhizolysis or intraspinal procedures pending response to present treatment and F/U evaluation   Patient to call Pain Management Center should patient have concerns prior to scheduled return appointment.  Intercostal Nerve Block Patient Information  Description: The twelve intercostal nerves arise from the first thru twelfth thoracic nerve roots.  The nerve begins at the spine and wraps around the body, lying in a groove underneath each rib.  Each intercostal nerve innervates a specific strip of skin and body walk of the abdomen and chest.  Therefore, injuries of the chest wall or abdominal wall result in pain that is transmitted back to the brian via the intercostal nerves.  Examples of such injuries include rib fractures and incisions for lung and gall bladder surgery.  Occasionally, pain may persist long after an injury or surgical incision secondary to inflammation and irritation of the intercostal nerve.  The longstanding pain is known as intercostal neuralgia.  An intercostal nerve block is preformed to eliminate pain either temporarily or permanently.  A small needle is placed below the rib and local anesthetic (like Novocaine) and possibly steroid is injected.  Usually 2-4 intercostal nerves are blocked at a time depending on the problem.  The patient will experience a slight "pin-prick" sensation for each injection.  Shortly thereafter, the strip of skin that is innervated by the blocked intercostal nerve will feel numb.  Persistent pain that is only temporarily relieved with local anesthetic may require a  more permanent block. This procedure is called Cryoneurolysis and entails placing a small probe beneath the rib to freeze the nerve.  Conditions that may be treated by intercostal nerve blocks:   Rib fractures  Longstanding pain from surgery of the chest or abdomen (intercostal neuralgia)  Pain from chest tubes  Pain from trauma to the chest  Preparation for the injections:  1. Do not eat any solid food or dairy products within 6 hours of your appointment. 2. You may drink clear liquids up to 2 hours before appointment.  Clear liquids include water, black coffee, juice or soda.  No milk or cream please. 3. You may take your regular medication, including pain medications, with a sip of water before your appointment.  Diabetics should hold regular insulin (if take separately) and take 1/2 normal NPH dose the morning of the procedure.   Carry some sugar containing items with you to your appointment. 4. A driver must accompany you and be prepared to drive you home after your procedure. 5. Bring all your current medications with you. 6. An IV may be inserted and sedation may be given at the discretion of the physician. 7. A blood pressure cuff, EKG and other monitors will often be applied during the procedure.  Some patients may need to have extra oxygen administered for a short period. 8. You will be asked to provide medical information, including your allergies, prior to the procedure.  We must know immediately if you are taking blood thinners (like Coumadin/Warfarin) or if you are allergic to IV iodine contrast (dye). We must know if you could possible be pregnant.  Possible side-effects:   Bleeding  from needle site  Infection (rare)  Nerve injury (rare)  Numbness & tingling of skin  Collapsed lung requiring chest tube (rare)  Local anesthetic toxicity (rare)  Light-headedness (temporary)  Pain at injection site (several days)  Decreased blood pressure  (temporary)  Shortness of breath  Jittery/shaking sensation (temporary)  Call if you experience:   Difficulty breathing or hives (go directly to the emergency room)  Redness, inflammation or drainage at the injection site  Severe pain at the site of the injection  Any new symptoms which are concerning   Please note:  Your pain may subside immediately but may return several hours after the injection.  Often, more than one injection is required to reduce the pain. Also, if several temporary blocks with local anesthetic are ineffective, a more permanent block with cryolysis may be necessary.  This will be discussed with you should this be the case.  If you have any questions, please call [phone] [facility]   A prescription for OXYCODONE, DIAZEPAM was given to you today.

## 2015-08-01 NOTE — Progress Notes (Signed)
Safety precautions to be maintained throughout the outpatient stay will include: orient to surroundings, keep bed in low position, maintain call bell within reach at all times, provide assistance with transfer out of bed and ambulation.  

## 2015-08-07 ENCOUNTER — Encounter: Payer: Self-pay | Admitting: Pain Medicine

## 2015-08-07 ENCOUNTER — Ambulatory Visit: Payer: Medicare Other | Attending: Pain Medicine | Admitting: Pain Medicine

## 2015-08-07 VITALS — BP 133/77 | HR 74 | Temp 97.8°F | Resp 16 | Wt 104.0 lb

## 2015-08-07 DIAGNOSIS — M546 Pain in thoracic spine: Secondary | ICD-10-CM | POA: Insufficient documentation

## 2015-08-07 DIAGNOSIS — Z9889 Other specified postprocedural states: Secondary | ICD-10-CM | POA: Diagnosis not present

## 2015-08-07 DIAGNOSIS — M160 Bilateral primary osteoarthritis of hip: Secondary | ICD-10-CM

## 2015-08-07 DIAGNOSIS — R0782 Intercostal pain: Secondary | ICD-10-CM | POA: Diagnosis not present

## 2015-08-07 DIAGNOSIS — S4382XD Sprain of other specified parts of left shoulder girdle, subsequent encounter: Secondary | ICD-10-CM

## 2015-08-07 DIAGNOSIS — M5137 Other intervertebral disc degeneration, lumbosacral region: Secondary | ICD-10-CM

## 2015-08-07 DIAGNOSIS — G43109 Migraine with aura, not intractable, without status migrainosus: Secondary | ICD-10-CM

## 2015-08-07 DIAGNOSIS — S46812D Strain of other muscles, fascia and tendons at shoulder and upper arm level, left arm, subsequent encounter: Secondary | ICD-10-CM

## 2015-08-07 DIAGNOSIS — M5030 Other cervical disc degeneration, unspecified cervical region: Secondary | ICD-10-CM

## 2015-08-07 DIAGNOSIS — G588 Other specified mononeuropathies: Secondary | ICD-10-CM

## 2015-08-07 DIAGNOSIS — M5134 Other intervertebral disc degeneration, thoracic region: Secondary | ICD-10-CM

## 2015-08-07 DIAGNOSIS — M5481 Occipital neuralgia: Secondary | ICD-10-CM

## 2015-08-07 MED ORDER — ORPHENADRINE CITRATE 30 MG/ML IJ SOLN: 60.0000 mg | Freq: Once | INTRAMUSCULAR | Status: DC

## 2015-08-07 MED ORDER — CEFAZOLIN SODIUM 1 G IJ SOLR
INTRAMUSCULAR | Status: AC
  Administered 2015-08-07: 1 g
  Filled 2015-08-07: qty 10

## 2015-08-07 MED ORDER — FENTANYL CITRATE (PF) 100 MCG/2ML IJ SOLN: 100.0000 ug | Freq: Once | INTRAMUSCULAR | Status: DC

## 2015-08-07 MED ORDER — MIDAZOLAM HCL 5 MG/5ML IJ SOLN
INTRAMUSCULAR | Status: AC
  Administered 2015-08-07: 4 mg via INTRAVENOUS
  Filled 2015-08-07: qty 5

## 2015-08-07 MED ORDER — FENTANYL CITRATE (PF) 100 MCG/2ML IJ SOLN
INTRAMUSCULAR | Status: AC
  Administered 2015-08-07: 100 ug via INTRAVENOUS
  Filled 2015-08-07: qty 2

## 2015-08-07 MED ORDER — LACTATED RINGERS IV SOLN: 1000.0000 mL | INTRAVENOUS | Status: DC

## 2015-08-07 MED ORDER — BUPIVACAINE HCL (PF) 0.25 % IJ SOLN
INTRAMUSCULAR | Status: AC
  Administered 2015-08-07: 13:00:00
  Filled 2015-08-07: qty 30

## 2015-08-07 MED ORDER — TRIAMCINOLONE ACETONIDE 40 MG/ML IJ SUSP: 40.0000 mg | Freq: Once | INTRAMUSCULAR | Status: DC

## 2015-08-07 MED ORDER — MIDAZOLAM HCL 5 MG/5ML IJ SOLN: 5.0000 mg | Freq: Once | INTRAMUSCULAR | Status: DC

## 2015-08-07 MED ORDER — BUPIVACAINE HCL (PF) 0.25 % IJ SOLN: 30.0000 mL | Freq: Once | INTRAMUSCULAR | Status: DC

## 2015-08-07 MED ORDER — CEFAZOLIN SODIUM 1-5 GM-% IV SOLN: 1.0000 g | Freq: Once | INTRAVENOUS | Status: DC

## 2015-08-07 MED ORDER — TRIAMCINOLONE ACETONIDE 40 MG/ML IJ SUSP
INTRAMUSCULAR | Status: AC
  Administered 2015-08-07: 13:00:00
  Filled 2015-08-07: qty 1

## 2015-08-07 MED ORDER — CEFUROXIME AXETIL 250 MG PO TABS: 250.0000 mg | ORAL_TABLET | Freq: Two times a day (BID) | ORAL | Status: DC

## 2015-08-07 NOTE — Patient Instructions (Addendum)
Pain Management Discharge Instructions   Continue present medications diazepam and oxycodone and obtain your antibiotic Ceftin today and begin taking your antibiotic Ceftin today as prescribed  F/U PCP for evaliation of  BP and general medical  condition  F/U surgical evaluation as discussed  F/U neurological evaluation for general neurological evaluation as discussed  May consider radiofrequency rhizolysis or intraspinal procedures pending response to present treatment and F/U evaluation   Patient to call Pain Management Center should patient have concerns prior to scheduled return appointmen.Pain Management Discharge Instructions  General Discharge Instructions :  If you need to reach your doctor call: Monday-Friday 8:00 am - 4:00 pm at [phone] or toll free [phone].  After clinic hours [phone] to have operator reach doctor.  Bring all of your medication bottles to all your appointments in the pain clinic.  To cancel or reschedule your appointment with Pain Management please remember to call 24 hours in advance to avoid a fee.  Refer to the educational materials which you have been given on: General Risks, I had my Procedure. Discharge Instructions, Post Sedation.  Post Procedure Instructions:  The drugs you were given will stay in your system until tomorrow, so for the next 24 hours you should not drive, make any legal decisions or drink any alcoholic beverages.  You may eat anything you prefer, but it is better to start with liquids then soups and crackers, and gradually work up to solid foods.  Please notify your doctor immediately if you have any unusual bleeding, trouble breathing or pain that is not related to your normal pain.  Depending on the type of procedure that was done, some parts of your body may feel week and/or numb.  This usually clears up by tonight or the next day.  Walk with the use of an assistive device or accompanied by an adult for the 24  hours.  You may use ice on the affected area for the first 24 hours.  Put ice in a Ziploc bag and cover with a towel and place against area 15 minutes on 15 minutes off.  You may switch to heat after 24 hours.GENERAL RISKS AND COMPLICATIONS  What are the risk, side effects and possible complications? Generally speaking, most procedures are safe.  However, with any procedure there are risks, side effects, and the possibility of complications.  The risks and complications are dependent upon the sites that are lesioned, or the type of nerve block to be performed.  The closer the procedure is to the spine, the more serious the risks are.  Great care is taken when placing the radio frequency needles, block needles or lesioning probes, but sometimes complications can occur. 1. Infection: Any time there is an injection through the skin, there is a risk of infection.  This is why sterile conditions are used for these blocks.  There are four possible types of infection. 1. Localized skin infection. 2. Central Nervous System Infection-This can be in the form of Meningitis, which can be deadly. 3. Epidural Infections-This can be in the form of an epidural abscess, which can cause pressure inside of the spine, causing compression of the spinal cord with subsequent paralysis. This would require an emergency surgery to decompress, and there are no guarantees that the patient would recover from the paralysis. 4. Discitis-This is an infection of the intervertebral discs.  It occurs in about 1% of discography procedures.  It is difficult to treat and it may lead to surgery.  2. Pain: the needles have to go through skin and soft tissues, will cause soreness.       3. Damage to internal structures:  The nerves to be lesioned may be near blood vessels or    other nerves which can be potentially damaged.       4. Bleeding: Bleeding is more common if the patient is taking blood thinners such as  aspirin, Coumadin,  Ticiid, Plavix, etc., or if he/she have some genetic predisposition  such as hemophilia. Bleeding into the spinal canal can cause compression of the spinal  cord with subsequent paralysis.  This would require an emergency surgery to  decompress and there are no guarantees that the patient would recover from the  paralysis.       5. Pneumothorax:  Puncturing of a lung is a possibility, every time a needle is introduced in  the area of the chest or upper back.  Pneumothorax refers to free air around the  collapsed lung(s), inside of the thoracic cavity (chest cavity).  Another two possible  complications related to a similar event would include: Hemothorax and Chylothorax.   These are variations of the Pneumothorax, where instead of air around the collapsed  lung(s), you may have blood or chyle, respectively.       6. Spinal headaches: They may occur with any procedures in the area of the spine.       7. Persistent CSF (Cerebro-Spinal Fluid) leakage: This is a rare problem, but may occur  with prolonged intrathecal or epidural catheters either due to the formation of a fistulous  track or a dural tear.       8. Nerve damage: By working so close to the spinal cord, there is always a possibility of  nerve damage, which could be as serious as a permanent spinal cord injury with  paralysis.       9. Death:  Although rare, severe deadly allergic reactions known as "Anaphylactic  reaction" can occur to any of the medications used.      10. Worsening of the symptoms:  We can always make thing worse.  What are the chances of something like this happening? Chances of any of this occuring are extremely low.  By statistics, you have more of a chance of getting killed in a motor vehicle accident: while driving to the hospital than any of the above occurring .  Nevertheless, you should be aware that they are possibilities.  In general, it is similar to taking a shower.  Everybody knows that you can slip, hit your head and  get killed.  Does that mean that you should not shower again?  Nevertheless always keep in mind that statistics do not mean anything if you happen to be on the wrong side of them.  Even if a procedure has a 1 (one) in a 1,000,000 (million) chance of going wrong, it you happen to be that one..Also, keep in mind that by statistics, you have more of a chance of having something go wrong when taking medications.  Who should not have this procedure? If you are on a blood thinning medication (e.g. Coumadin, Plavix, see list of "Blood Thinners"), or if you have an active infection going on, you should not have the procedure.  If you are taking any blood thinners, please inform your physician.  How should I prepare for this procedure?  Do not eat or drink anything at least six hours prior to the procedure.  Bring a driver  with you .  It cannot be a taxi.  Come accompanied by an adult that can drive you back, and that is strong enough to help you if your legs get weak or numb from the local anesthetic.  Take all of your medicines the morning of the procedure with just enough water to swallow them.  If you have diabetes, make sure that you are scheduled to have your procedure done first thing in the morning, whenever possible.  If you have diabetes, take only half of your insulin dose and notify our nurse that you have done so as soon as you arrive at the clinic.  If you are diabetic, but only take blood sugar pills (oral hypoglycemic), then do not take them on the morning of your procedure.  You may take them after you have had the procedure.  Do not take aspirin or any aspirin-containing medications, at least eleven (11) days prior to the procedure.  They may prolong bleeding.  Wear loose fitting clothing that may be easy to take off and that you would not mind if it got stained with Betadine or blood.  Do not wear any jewelry or perfume  Remove any nail coloring.  It will interfere with some of  our monitoring equipment.  NOTE: Remember that this is not meant to be interpreted as a complete list of all possible complications.  Unforeseen problems may occur.  BLOOD THINNERS The following drugs contain aspirin or other products, which can cause increased bleeding during surgery and should not be taken for 2 weeks prior to and 1 week after surgery.  If you should need take something for relief of minor pain, you may take acetaminophen which is found in Tylenol,m Datril, Anacin-3 and Panadol. It is not blood thinner. The products listed below are.  Do not take any of the products listed below in addition to any listed on your instruction sheet.  A.P.C or A.P.C with Codeine Codeine Phosphate Capsules #3 Ibuprofen Ridaura  ABC compound Congesprin Imuran rimadil  Advil Cope Indocin Robaxisal  Alka-Seltzer Effervescent Pain Reliever and Antacid Coricidin or Coricidin-D  Indomethacin Rufen  Alka-Seltzer plus Cold Medicine Cosprin Ketoprofen S-A-C Tablets  Anacin Analgesic Tablets or Capsules Coumadin Korlgesic Salflex  Anacin Extra Strength Analgesic tablets or capsules CP-2 Tablets Lanoril Salicylate  Anaprox Cuprimine Capsules Levenox Salocol  Anexsia-D Dalteparin Magan Salsalate  Anodynos Darvon compound Magnesium Salicylate Sine-off  Ansaid Dasin Capsules Magsal Sodium Salicylate  Anturane Depen Capsules Marnal Soma  APF Arthritis pain formula Dewitt's Pills Measurin Stanback  Argesic Dia-Gesic Meclofenamic Sulfinpyrazone  Arthritis Bayer Timed Release Aspirin Diclofenac Meclomen Sulindac  Arthritis pain formula Anacin Dicumarol Medipren Supac  Analgesic (Safety coated) Arthralgen Diffunasal Mefanamic Suprofen  Arthritis Strength Bufferin Dihydrocodeine Mepro Compound Suprol  Arthropan liquid Dopirydamole Methcarbomol with Aspirin Synalgos  ASA tablets/Enseals Disalcid Micrainin Tagament  Ascriptin Doan's Midol Talwin  Ascriptin A/D Dolene Mobidin Tanderil  Ascriptin Extra Strength  Dolobid Moblgesic Ticlid  Ascriptin with Codeine Doloprin or Doloprin with Codeine Momentum Tolectin  Asperbuf Duoprin Mono-gesic Trendar  Aspergum Duradyne Motrin or Motrin IB Triminicin  Aspirin plain, buffered or enteric coated Durasal Myochrisine Trigesic  Aspirin Suppositories Easprin Nalfon Trillsate  Aspirin with Codeine Ecotrin Regular or Extra Strength Naprosyn Uracel  Atromid-S Efficin Naproxen Ursinus  Auranofin Capsules Elmiron Neocylate Vanquish  Axotal Emagrin Norgesic Verin  Azathioprine Empirin or Empirin with Codeine Normiflo Vitamin E  Azolid Emprazil Nuprin Voltaren  Bayer Aspirin plain, buffered or children's or timed BC Tablets or powders  Encaprin Orgaran Warfarin Sodium  Buff-a-Comp Enoxaparin Orudis Zorpin  Buff-a-Comp with Codeine Equegesic Os-Cal-Gesic   Buffaprin Excedrin plain, buffered or Extra Strength Oxalid   Bufferin Arthritis Strength Feldene Oxphenbutazone   Bufferin plain or Extra Strength Feldene Capsules Oxycodone with Aspirin   Bufferin with Codeine Fenoprofen Fenoprofen Pabalate or Pabalate-SF   Buffets II Flogesic Panagesic   Buffinol plain or Extra Strength Florinal or Florinal with Codeine Panwarfarin   Buf-Tabs Flurbiprofen Penicillamine   Butalbital Compound Four-way cold tablets Penicillin   Butazolidin Fragmin Pepto-Bismol   Carbenicillin Geminisyn Percodan   Carna Arthritis Reliever Geopen Persantine   Carprofen Gold's salt Persistin   Chloramphenicol Goody's Phenylbutazone   Chloromycetin Haltrain Piroxlcam   Clmetidine heparin Plaquenil   Cllnoril Hyco-pap Ponstel   Clofibrate Hydroxy chloroquine Propoxyphen         Before stopping any of these medications, be sure to consult the physician who ordered them.  Some, such as Coumadin (Warfarin) are ordered to prevent or treat serious conditions such as "deep thrombosis", "pumonary embolisms", and other heart problems.  The amount of time that you may need off of the medication may also  vary with the medication and the reason for which you were taking it.  If you are taking any of these medications, please make sure you notify your pain physician before you undergo any procedures.

## 2015-08-07 NOTE — Progress Notes (Signed)
Safety precautions to be maintained throughout the outpatient stay will include: orient to surroundings, keep bed in low position, maintain call bell within reach at all times, provide assistance with transfer out of bed and ambulation.  

## 2015-08-07 NOTE — Progress Notes (Signed)
   Subjective:    Patient ID: Sharon Welch, female    DOB: Jan 07, 1971, 45 y.o.   MRN: [id number]  HPI  PROCEDURE PERFORMED:  Intercostal nerve block.  HISTORY OF PRESENT ILLNESS:  The patient is a 45 y.o. female who returns to the [facility] for further evaluation and treatment of pain involving the midportion of the back. The patient is status post tobacco lumbar surgery with hardware of the thoracic and lumbar region. The patient is with. Limited range of motion of the thoracic and lumbar spine due to hardware insertion. The patient is a severely disabling pain of the thoracic oh lumbar region with severe muscle spasms of the thoracic region the patient is with pain felt to be due to severe muscle spasms of the thoracic region associated with intercostal neuralgia. The risks, benefits, and expectations of the procedure have been discussed and explained to the patient who was understanding and in agreement with suggested treatment plan. We will proceed with interventional treatment as discussed.   DESCRIPTION OF PROCEDURE: Intercostal nerve block with IV Versed, IV fentanyl conscious sedation, EKG, blood pressure, pulse, and pulse oximetry monitoring. The procedure was performed with the patient in the prone position under fluoroscopic guidance.   Intercostal nerve block, left side: With the patient in the prone position, Betadine prep of proposed entry site was performed under fluoroscopic guidance with AP view of the thoracic spine. Under fluoroscopic guidance, a 22 -gauge needle was inserted to contact bone of the 10th rib on the left side after which the needle was repositioned at the inferior border of the 10th rib on the left side under fluoroscopic guidance. Following documentation of needle placement, at the inferior border of the 10th rib on the left side and negative aspiration, a total of 3 mL of 0.25% bupivacaine with Kenalog was injected for left side for tenderness rib  intercostal nerve block.   INTERCOSTAL NERVE BLOCKS AT T9, T8, T7, T6, and T5 LEVELS: The procedure was performed at these levels as was performed at the previous level, T10, utilizing the same technique and under fluoroscopic guidance  Myoneural block injections of the thoracic region Following Betadine prep proposed entry site a 22-gauge needle was inserted in the thoracic musculature region and following negative aspiration 1 cc of 0.25% bupivacaine with Norflex was injected for myoneural block injection of the thoracic region times to  The patient tolerated procedure well  A total of 10 mg Kenalog was utilized for the procedure.   PLAN:   1. Medications: We will continue presently prescribed medications diazepam and oxycodone. 2. The patient is to follow up with primary care physician for further evaluation of blood pressure and general medical condition as discussed. 3. Surgical evaluation. The patient will follow up with surgeon as discussed 4. Neurological evaluation as planned 5. May consider the patient for additional studies pending response to treatment and follow-up evaluation. 6. May consider radiofrequency procedures, implantation type procedures and other treatment pending response to treatment and follow-up evaluation. 7. The patient has been advised to adhere to proper body mechanics and to call the [facility] prior to scheduled return appointment should there be significant change in condition or have other concerns regarding condition prior to scheduled return appointment.  The patient is understanding and in agreement with suggested treatment plan.    Review of Systems     Objective:   Physical Exam        Assessment & Plan:

## 2015-08-08 ENCOUNTER — Telehealth: Payer: Self-pay | Admitting: *Deleted

## 2015-08-08 NOTE — Telephone Encounter (Signed)
No problems post procedure. 

## 2015-08-10 DIAGNOSIS — M170 Bilateral primary osteoarthritis of knee: Secondary | ICD-10-CM | POA: Diagnosis not present

## 2015-08-29 ENCOUNTER — Encounter: Payer: Self-pay | Admitting: Pain Medicine

## 2015-08-29 ENCOUNTER — Ambulatory Visit: Payer: Medicare Other | Attending: Pain Medicine | Admitting: Pain Medicine

## 2015-08-29 VITALS — BP 116/90 | HR 89 | Temp 97.8°F | Resp 16 | Ht 61.0 in | Wt 94.0 lb

## 2015-08-29 DIAGNOSIS — Z9889 Other specified postprocedural states: Secondary | ICD-10-CM | POA: Diagnosis not present

## 2015-08-29 DIAGNOSIS — G43909 Migraine, unspecified, not intractable, without status migrainosus: Secondary | ICD-10-CM | POA: Insufficient documentation

## 2015-08-29 DIAGNOSIS — M4185 Other forms of scoliosis, thoracolumbar region: Secondary | ICD-10-CM | POA: Insufficient documentation

## 2015-08-29 DIAGNOSIS — M47817 Spondylosis without myelopathy or radiculopathy, lumbosacral region: Secondary | ICD-10-CM | POA: Diagnosis not present

## 2015-08-29 DIAGNOSIS — R51 Headache: Secondary | ICD-10-CM | POA: Insufficient documentation

## 2015-08-29 DIAGNOSIS — M5136 Other intervertebral disc degeneration, lumbar region: Secondary | ICD-10-CM | POA: Diagnosis not present

## 2015-08-29 DIAGNOSIS — M542 Cervicalgia: Secondary | ICD-10-CM | POA: Diagnosis present

## 2015-08-29 DIAGNOSIS — M5481 Occipital neuralgia: Secondary | ICD-10-CM | POA: Insufficient documentation

## 2015-08-29 DIAGNOSIS — M5137 Other intervertebral disc degeneration, lumbosacral region: Secondary | ICD-10-CM

## 2015-08-29 DIAGNOSIS — M4186 Other forms of scoliosis, lumbar region: Secondary | ICD-10-CM | POA: Insufficient documentation

## 2015-08-29 DIAGNOSIS — M160 Bilateral primary osteoarthritis of hip: Secondary | ICD-10-CM

## 2015-08-29 DIAGNOSIS — R0782 Intercostal pain: Secondary | ICD-10-CM | POA: Diagnosis not present

## 2015-08-29 DIAGNOSIS — M533 Sacrococcygeal disorders, not elsewhere classified: Secondary | ICD-10-CM | POA: Diagnosis not present

## 2015-08-29 DIAGNOSIS — M5134 Other intervertebral disc degeneration, thoracic region: Secondary | ICD-10-CM | POA: Insufficient documentation

## 2015-08-29 DIAGNOSIS — G588 Other specified mononeuropathies: Secondary | ICD-10-CM

## 2015-08-29 DIAGNOSIS — M5030 Other cervical disc degeneration, unspecified cervical region: Secondary | ICD-10-CM | POA: Diagnosis not present

## 2015-08-29 DIAGNOSIS — M791 Myalgia: Secondary | ICD-10-CM | POA: Insufficient documentation

## 2015-08-29 DIAGNOSIS — S4382XD Sprain of other specified parts of left shoulder girdle, subsequent encounter: Secondary | ICD-10-CM

## 2015-08-29 DIAGNOSIS — G43109 Migraine with aura, not intractable, without status migrainosus: Secondary | ICD-10-CM

## 2015-08-29 DIAGNOSIS — S46812D Strain of other muscles, fascia and tendons at shoulder and upper arm level, left arm, subsequent encounter: Secondary | ICD-10-CM

## 2015-08-29 DIAGNOSIS — M5416 Radiculopathy, lumbar region: Secondary | ICD-10-CM | POA: Diagnosis not present

## 2015-08-29 MED ORDER — DIAZEPAM 5 MG PO TABS: ORAL_TABLET | ORAL | Status: DC

## 2015-08-29 MED ORDER — OXYCODONE HCL 5 MG PO TABS: ORAL_TABLET | ORAL | Status: DC

## 2015-08-29 NOTE — Patient Instructions (Addendum)
PLAN   Continue present medication  diazepam and oxycodone  Sphenopalatine ganglion block to be performed at time of return appointment  F/U PCP for evaliation of  BP and general medical  condition  F/U surgical evaluation as planned and Synvisc injections of knee as previously discussed  F/U neurological evaluation as planned with Dr.Baute as we discussed  Psych eval as discussed .  May consider radiofrequency rhizolysis or intraspinal procedures pending response to present treatment and F/U evaluation   Patient to call Pain Management Center should patient have concerns prior to scheduled return appointment.

## 2015-08-29 NOTE — Progress Notes (Signed)
Safety precautions to be maintained throughout the outpatient stay will include: orient to surroundings, keep bed in low position, maintain call bell within reach at all times, provide assistance with transfer out of bed and ambulation.  

## 2015-08-29 NOTE — Progress Notes (Signed)
Subjective:    Patient ID: Sharon Welch, female    DOB: Feb 23, 1971, 45 y.o.   MRN: [id number]  HPI The patient is a 45 year old female who returns to pain management for further evaluation and treatment of pain involving the region of the neck associated with pain of the neck radiating to the back of the hip precipitating headaches. The patient also has a history of migraine headaches. The patient stated that headaches have been bothersome recently. The patient is undergone neurological evaluation and continues under the care of neurologist for treatment of headaches. We discussed patient's condition and will consider patient for sphenopalatine ganglion block at time of return appointment. The patient has had improvement of spasms occurring in the mid back region status post intercostal nerve blocks. The patient is status post surgery of the thoracic oh lumbar spine with hardware involving the thoracic oh lumbar region with limited range of motion of the thoracic oh lumbar spine precipitating significant pain and muscle spasms of the thoracic oh lumbar region as well as cervical region contributing to headaches felt to be with component of greater occipital neuralgia myofascial pain related headaches as well as component of migraine headache. We will proceed with sphenopalatine ganglion block at time of return appointment in attempt to decrease severity of patient's headaches as well as frequency of patient's headaches and minimize the need for more involved treatment. The patient agreed to suggested treatment plan. We will continue medications consisting of Valium and oxycodone as prescribed at this time. The patient agreed to suggested treatment plan   Review of Systems     Objective:   Physical Exam There was tenderness to palpation of the splenius capitate and occipitalis musculature region a moderate degree with moderate to moderately severe tenderness to palpation over the splenius capitis and  occipitalis musculature region on the left compared to the right. There was mild to moderate tenderness over the cervical facet cervical paraspinal musculature region. The patient was without bounding pulsations of the temporal region and no new lesions of the head or neck were noted. There was significant muscle spasms involving the thoracic paraspinal musculature region without crepitus of the thoracic region noted. There was significant tends to palpation of the trapezius levator scapula and rhomboid musculature regions palpation which be produced moderate to severe discomfort. The patient appeared to be with unremarkable Spurling's maneuver and appeared to be with out laterally equal grip strength with Tinel and Phalen's maneuver reproducing minimal discomfort. Palpation over the lumbar paraspinal musculature region lumbar facet region was with moderate to moderately severe tenderness to palpation with moderate to moderately severe muscle spasms noted as well there was limited straight leg raising to approximately 30 without a definite increase of pain with dorsiflexion noted. DTRs were difficult to elicit there was negative clonus negative Homans there was no increased warmth and erythema in the region of the knees there was negative anterior and posterior drawer signs without ballottement of the patella. No erythema of the knees were noted. Crepitus of the knees were noted. There was tenderness of the PSIS and PII S region as well as the gluteal and piriformis muscles region a moderate degree with sensory deficit or dermatomal distribution detected. There was negative clonus negative Homans. Abdomen was nontender with no costovertebral tenderness noted       Assessment & Plan:   Migraine headaches  Bilateral occipital neuralgia  Degenerative disc disease thoracic spine Thoracic facet syndrome Status post Harrington rods thoracic and lumbar regions for severe  scoliosis of the thoracolumbar  spine  Degenerative disc disease lumbar spine Lumbar facet syndrome Status post Harrington rods for severe scoliosis of the thoracic and lumbar regions  Degenerative disc disease cervical spine  Cervical facet syndrome     PLAN   Continue present medication  diazepam and oxycodone  Sphenopalatine ganglion block to be performed at time of return appointment  F/U PCP for evaliation of  BP and general medical  condition  F/U surgical evaluation as planned and Synvisc injections of knee as previously discussed  F/U neurological evaluation as planned with Dr.Baute as we discussed  Psych eval as discussed .  May consider radiofrequency rhizolysis or intraspinal procedures pending response to present treatment and F/U evaluation   Patient to call [facility] Center should patient have concerns prior to scheduled return appointment.

## 2015-09-04 ENCOUNTER — Ambulatory Visit: Payer: Medicare Other | Admitting: Pain Medicine

## 2015-09-04 ENCOUNTER — Telehealth: Payer: Self-pay | Admitting: Pain Medicine

## 2015-09-04 NOTE — Telephone Encounter (Signed)
Patient called at 10:30 09-04-15 she had death in family and forgot about appt today, has resched for 09-18-15 at 10:30 for procedure

## 2015-09-04 NOTE — Telephone Encounter (Signed)
Thank you :)

## 2015-09-18 ENCOUNTER — Telehealth: Payer: Self-pay | Admitting: Pain Medicine

## 2015-09-18 ENCOUNTER — Ambulatory Visit: Payer: Medicare Other | Admitting: Pain Medicine

## 2015-09-18 NOTE — Telephone Encounter (Signed)
Patient called and left vmail on Mon 4:31 she is sick and has fever needs to resched appt

## 2015-09-18 NOTE — Telephone Encounter (Signed)
Secretaries Please schedule patient for Monday, 09/25/2015

## 2015-09-25 ENCOUNTER — Encounter: Payer: Self-pay | Admitting: Pain Medicine

## 2015-09-25 ENCOUNTER — Ambulatory Visit: Payer: Medicare Other | Attending: Pain Medicine | Admitting: Pain Medicine

## 2015-09-25 VITALS — BP 121/57 | HR 62 | Temp 97.8°F | Resp 53 | Ht 61.0 in | Wt 84.0 lb

## 2015-09-25 DIAGNOSIS — G43909 Migraine, unspecified, not intractable, without status migrainosus: Secondary | ICD-10-CM | POA: Insufficient documentation

## 2015-09-25 DIAGNOSIS — G43109 Migraine with aura, not intractable, without status migrainosus: Secondary | ICD-10-CM

## 2015-09-25 DIAGNOSIS — S46812D Strain of other muscles, fascia and tendons at shoulder and upper arm level, left arm, subsequent encounter: Secondary | ICD-10-CM

## 2015-09-25 DIAGNOSIS — G588 Other specified mononeuropathies: Secondary | ICD-10-CM

## 2015-09-25 DIAGNOSIS — M5137 Other intervertebral disc degeneration, lumbosacral region: Secondary | ICD-10-CM

## 2015-09-25 DIAGNOSIS — G43009 Migraine without aura, not intractable, without status migrainosus: Secondary | ICD-10-CM | POA: Diagnosis not present

## 2015-09-25 DIAGNOSIS — R51 Headache: Secondary | ICD-10-CM | POA: Insufficient documentation

## 2015-09-25 DIAGNOSIS — M160 Bilateral primary osteoarthritis of hip: Secondary | ICD-10-CM

## 2015-09-25 DIAGNOSIS — M5134 Other intervertebral disc degeneration, thoracic region: Secondary | ICD-10-CM

## 2015-09-25 DIAGNOSIS — S4382XD Sprain of other specified parts of left shoulder girdle, subsequent encounter: Secondary | ICD-10-CM

## 2015-09-25 DIAGNOSIS — G43119 Migraine with aura, intractable, without status migrainosus: Secondary | ICD-10-CM

## 2015-09-25 DIAGNOSIS — M5481 Occipital neuralgia: Secondary | ICD-10-CM

## 2015-09-25 DIAGNOSIS — M5030 Other cervical disc degeneration, unspecified cervical region: Secondary | ICD-10-CM

## 2015-09-25 MED ORDER — LIDOCAINE HCL 4 % EX SOLN
CUTANEOUS | Status: AC
  Filled 2015-09-25: qty 50

## 2015-09-25 MED ORDER — OXYCODONE HCL 5 MG PO TABS: ORAL_TABLET | ORAL | Status: DC

## 2015-09-25 MED ORDER — DIAZEPAM 5 MG PO TABS: ORAL_TABLET | ORAL | Status: DC

## 2015-09-25 NOTE — Progress Notes (Signed)
Patient here today for procedure to relieve chronic miagranes. Safety precautions to be maintained throughout the outpatient stay will include: orient to surroundings, keep bed in low position, maintain call bell within reach at all times, provide assistance with transfer out of bed and ambulation.

## 2015-09-25 NOTE — Patient Instructions (Signed)
PLAN   Continue present medication  diazepam and oxycodon  F/U PCP for evaliation of  BP and general medical  condition  F/U surgical evaluation as planned and Synvisc injections of knee as previously discussed  F/U neurological evaluation as planned with Dr.Baute as we discussed  Psych eval as discussed .  May consider radiofrequency rhizolysis or intraspinal procedures pending response to present treatment and F/U evaluation   Patient to call [facility] Management Center should patient have concerns prior to scheduled return appointment.

## 2015-09-25 NOTE — Progress Notes (Signed)
   Subjective:    Patient ID: Sharon Welch, female    DOB: [birth year], 45 y.o.   MRN: [id number]  HPI                                                                                                                SPHENOPALATINE GANGLION BLOCK                                                                        The patient is a 45 -year-old female who comes to pain management for follow-up evaluation and treatment of headache. The patient has been felt to be with significant component of headaches due to migraine. Decision has been made to proceed with sphenopalatine ganglion block in attempt to decrease severity of patient's headaches, reduce the frequency of patient's headaches, and avoid the need for more extensive treatment. The risks, benefits, and expectations of the procedure have been discussed with patient and explained to patient and all are in agreement to proceed with sphenopalatine ganglion block as planned   Description Of Procedure:  Sphenopalatine Ganglion Block  The patient assumed the supine position with head hyperextended. EKG, blood pressure, pulse, and pulse oximetry monitors were all in place. Next 1 cc of 4% topical lidocaine was instilled in the right nostril followed by 1 cc of 4% lidocaine instilled in the left nostril while patient remained in the supine position with the head hyperextended The patient tolerated the administration of medication very well A cotton pledget soaked with 1 cc of 4% lidocaine was placed in the right nostril followed by a cotton pledget soaked with 1 cc of 4% lidocaine placed in the left nostril. The patient remained in the supine position with cotton pledgets in place for 35 minutes  The patient tolerated the procedure well  The patient stated that the headache well with resolution of headache following this sphenopalatine ganglion block  .    PLAN   Continue present medications Valium and oxycodone  F/U PCP for evaliation of   BP and general medical  condition  F/U surgical evaluation as discussed  F/U neurological  Dr. Larose Kells evaluation for further assessment and treatment of headaches has been addressed  May consider radiofrequency rhizolysis or intraspinal procedures pending response to present treatment and F/U evaluation   F/U psych evaluation  Patient to call [facility] Center should patient have concerns prior to scheduled return appointment.    Review of Systems     Objective:   Physical Exam        Assessment & Plan:

## 2015-09-26 ENCOUNTER — Telehealth: Payer: Self-pay | Admitting: *Deleted

## 2015-09-26 ENCOUNTER — Ambulatory Visit: Payer: Medicare Other | Admitting: Pain Medicine

## 2015-09-26 NOTE — Telephone Encounter (Signed)
Spoke with patient, verbalizes no concerns from procedure on yesterday.

## 2015-10-03 ENCOUNTER — Other Ambulatory Visit: Payer: Self-pay | Admitting: Pain Medicine

## 2015-10-17 ENCOUNTER — Encounter (HOSPITAL_COMMUNITY): Payer: Self-pay | Admitting: *Deleted

## 2015-10-17 ENCOUNTER — Emergency Department (HOSPITAL_COMMUNITY)
Admission: EM | Admit: 2015-10-17 | Discharge: 2015-10-17 | Disposition: A | Discharge status: Home / Self Care | Payer: Medicare Other | Attending: Emergency Medicine | Admitting: Emergency Medicine

## 2015-10-17 DIAGNOSIS — G8929 Other chronic pain: Secondary | ICD-10-CM | POA: Insufficient documentation

## 2015-10-17 DIAGNOSIS — Z8742 Personal history of other diseases of the female genital tract: Secondary | ICD-10-CM | POA: Insufficient documentation

## 2015-10-17 DIAGNOSIS — Y9289 Other specified places as the place of occurrence of the external cause: Secondary | ICD-10-CM | POA: Insufficient documentation

## 2015-10-17 DIAGNOSIS — Y998 Other external cause status: Secondary | ICD-10-CM | POA: Insufficient documentation

## 2015-10-17 DIAGNOSIS — R21 Rash and other nonspecific skin eruption: Secondary | ICD-10-CM | POA: Insufficient documentation

## 2015-10-17 DIAGNOSIS — Z79899 Other long term (current) drug therapy: Secondary | ICD-10-CM | POA: Insufficient documentation

## 2015-10-17 DIAGNOSIS — S0086XA Insect bite (nonvenomous) of other part of head, initial encounter: Secondary | ICD-10-CM | POA: Diagnosis not present

## 2015-10-17 DIAGNOSIS — F17210 Nicotine dependence, cigarettes, uncomplicated: Secondary | ICD-10-CM | POA: Insufficient documentation

## 2015-10-17 DIAGNOSIS — M25521 Pain in right elbow: Secondary | ICD-10-CM | POA: Insufficient documentation

## 2015-10-17 DIAGNOSIS — Y9389 Activity, other specified: Secondary | ICD-10-CM | POA: Diagnosis not present

## 2015-10-17 DIAGNOSIS — W57XXXA Bitten or stung by nonvenomous insect and other nonvenomous arthropods, initial encounter: Secondary | ICD-10-CM | POA: Diagnosis not present

## 2015-10-17 DIAGNOSIS — R51 Headache: Secondary | ICD-10-CM | POA: Diagnosis present

## 2015-10-17 DIAGNOSIS — G43909 Migraine, unspecified, not intractable, without status migrainosus: Secondary | ICD-10-CM | POA: Diagnosis not present

## 2015-10-17 DIAGNOSIS — T148 Other injury of unspecified body region: Secondary | ICD-10-CM | POA: Insufficient documentation

## 2015-10-17 DIAGNOSIS — M25522 Pain in left elbow: Secondary | ICD-10-CM | POA: Diagnosis not present

## 2015-10-17 LAB — BASIC METABOLIC PANEL
ANION GAP: 10 (ref 5–15)
BUN: 6 mg/dL (ref 6–20)
CALCIUM: 8.2 mg/dL — AB (ref 8.9–10.3)
CO2: 27 mmol/L (ref 22–32)
Chloride: 99 mmol/L — ABNORMAL LOW (ref 101–111)
Creatinine, Ser: 0.62 mg/dL (ref 0.44–1.00)
GFR calc Af Amer: >60 mL/min (ref >60)
GLUCOSE: 92 mg/dL (ref 65–99)
Potassium: 3.1 mmol/L — ABNORMAL LOW (ref 3.5–5.1)
Sodium: 136 mmol/L (ref 135–145)

## 2015-10-17 LAB — CBC WITH DIFFERENTIAL/PLATELET
BASOS ABS: 0.1 10*3/uL (ref 0.0–0.1)
BASOS PCT: 1 %
EOS PCT: 3 %
Eosinophils Absolute: 0.2 10*3/uL (ref 0.0–0.7)
HCT: 36.2 % (ref 36.0–46.0)
Hemoglobin: 12.4 g/dL (ref 12.0–15.0)
LYMPHS PCT: 30 %
Lymphs Abs: 2.2 10*3/uL (ref 0.7–4.0)
MCH: 32.1 pg (ref 26.0–34.0)
MCHC: 34.3 g/dL (ref 30.0–36.0)
MCV: 93.8 fL (ref 78.0–100.0)
MONO ABS: 1.1 10*3/uL — AB (ref 0.1–1.0)
MONOS PCT: 16 %
Neutro Abs: 3.7 10*3/uL (ref 1.7–7.7)
Neutrophils Relative %: 50 %
PLATELETS: 226 10*3/uL (ref 150–400)
RBC: 3.86 MIL/uL — ABNORMAL LOW (ref 3.87–5.11)
RDW: 13.3 % (ref 11.5–15.5)
WBC: 7.3 10*3/uL (ref 4.0–10.5)

## 2015-10-17 MED ORDER — KETOROLAC TROMETHAMINE 15 MG/ML IJ SOLN: 15.0000 mg | Freq: Once | INTRAMUSCULAR | Status: DC

## 2015-10-17 MED ORDER — DOXYCYCLINE HYCLATE 100 MG PO TABS
100.0000 mg | ORAL_TABLET | Freq: Once | ORAL | Status: AC
  Administered 2015-10-17: 100 mg via ORAL
  Filled 2015-10-17: qty 1

## 2015-10-17 MED ORDER — DOXYCYCLINE HYCLATE 100 MG PO CAPS: 100.0000 mg | ORAL_CAPSULE | Freq: Two times a day (BID) | ORAL | Status: DC

## 2015-10-17 MED ORDER — DIPHENHYDRAMINE HCL 25 MG PO CAPS: 25.0000 mg | ORAL_CAPSULE | Freq: Four times a day (QID) | ORAL | Status: DC | PRN

## 2015-10-17 MED ORDER — KETOROLAC TROMETHAMINE 15 MG/ML IJ SOLN
15.0000 mg | Freq: Once | INTRAMUSCULAR | Status: AC
  Administered 2015-10-17: 15 mg via INTRAVENOUS
  Filled 2015-10-17: qty 1

## 2015-10-17 MED ORDER — DIPHENHYDRAMINE HCL 50 MG/ML IJ SOLN: 25.0000 mg | Freq: Once | INTRAMUSCULAR | Status: DC

## 2015-10-17 MED ORDER — METOCLOPRAMIDE HCL 5 MG/ML IJ SOLN: 10.0000 mg | Freq: Once | INTRAMUSCULAR | Status: DC

## 2015-10-17 MED ORDER — SODIUM CHLORIDE 0.9 % IV BOLUS (SEPSIS)
1000.0000 mL | Freq: Once | INTRAVENOUS | Status: AC
  Administered 2015-10-17: 1000 mL via INTRAVENOUS

## 2015-10-17 MED ORDER — METOCLOPRAMIDE HCL 5 MG/ML IJ SOLN
10.0000 mg | Freq: Once | INTRAMUSCULAR | Status: AC
  Administered 2015-10-17: 10 mg via INTRAVENOUS
  Filled 2015-10-17: qty 2

## 2015-10-17 NOTE — ED Provider Notes (Signed)
CSN: [id number]     Arrival date & time 10/17/15  0241 History   First MD Initiated Contact with Patient 10/17/15 0534     Chief Complaint  Patient presents with  . Headache     (Consider location/radiation/quality/duration/timing/severity/associated sxs/prior Treatment) HPI Comments: Pt comes in with cc of headache, joint pain and tick bite. She has hx of migraines, and the current headache is similar. The headache is bilateral, frontal and posterior, throbbing, with photophobia. Pt has nausea, no emesis. No neck pain or stiffness. She also discovered being bit by tick On Saturday. She had gone out camping on Friday, and suspects that she was bit on Friday. She since then has developed itching, generalized, but worse over the face with a malar type rash to the face. She also has had bilateral elbow pain. No target sign type lesion. No fevers, chills.    ROS 10 Systems reviewed and are negative for acute change except as noted in the HPI.     Patient is a 45 y.o. female presenting with headaches. The history is provided by the patient.  Headache   Past Medical History  Diagnosis Date  . Scoliosis   . Chronic back pain   . Seizures ([place])     started 9/12-  . Headache(784.0)   . Chronic back pain   . Partial tear subscapularis tendon 12/27/2011  . Ovarian cyst   . Bruising, spontaneous 12/08/2013  . DDD (degenerative disc disease), lumbar   . DDD (degenerative disc disease), lumbar   . Fibromyalgia    Past Surgical History  Procedure Laterality Date  . Back surgery  1989    scoliosis throsic-rods  . Tubal ligation    . Cesarean section    . Abdominal hysterectomy  2012  . Left arm    . Eus N/A 11/03/2012    Procedure: UPPER ENDOSCOPIC ULTRASOUND (EUS) LINEAR;  Surgeon: Beryle Beams, MD;  Location: [facility];  Service: Endoscopy;  Laterality: N/A;  . Cholecystectomy N/A 11/17/2012    Procedure: LAPAROSCOPIC CHOLECYSTECTOMY WITH INTRAOPERATIVE CHOLANGIOGRAM;  Surgeon:  Ralene Ok, MD;  Location: [facility];  Service: General;  Laterality: N/A;  . Neck surgery    . Ercp N/A 05/07/2013    Procedure: ENDOSCOPIC RETROGRADE CHOLANGIOPANCREATOGRAPHY (ERCP);  Surgeon: Beryle Beams, MD;  Location: [facility];  Service: Endoscopy;  Laterality: N/A;  start ercp first, if canulation fails switch to EUS    Family History  Problem Relation Age of Onset  . Hypertension Mother   . Hypertension Father   . Cancer Father     prostate  . Heart disease Father   . Hypertension Brother   . Hypertension Brother    Social History  Substance Use Topics  . Smoking status: Current Every Day Smoker -- 0.50 packs/day for 20 years    Types: Cigarettes    Last Attempt to Quit: 08/22/2013  . Smokeless tobacco: Never Used  . Alcohol Use: 0.0 oz/week    0 Standard drinks or equivalent per week     Comment: 1-2 sips a years   OB History    No data available     Review of Systems  Neurological: Positive for headaches.      Allergies  Tramadol  Home Medications   Prior to Admission medications   Medication Sig Start Date End Date Taking? Authorizing Provider  diazepam (VALIUM) 5 MG tablet Limit 1 tab by mouth 2-3 times per day times per day if tolerated    LimitLIMIT  80 per month Patient taking differently: Take 5 mg by mouth every 6 (six) hours as needed for anxiety.  09/25/15  Yes Mohammed Kindle, MD  oxyCODONE (OXY IR/ROXICODONE) 5 MG immediate release tablet LIMIT 3 - 4  TABS PO / DAY IF TOLERATED Patient taking differently: Take 5 mg by mouth every 6 (six) hours as needed for moderate pain.  09/25/15  Yes Mohammed Kindle, MD  promethazine (PHENERGAN) 12.5 MG tablet Take 12.5 mg by mouth every 8 (eight) hours as needed for nausea.  08/28/15  Yes Historical Provider, MD  SUMAtriptan (IMITREX) 25 MG tablet Take 25 mg by mouth every 2 (two) hours as needed for migraine (one a day as needed). May repeat in 2 hours if headache persists or recurs.   Yes Historical Provider, MD   venlafaxine (EFFEXOR) 75 MG tablet Take 75 mg by mouth 2 (two) times daily.   Yes Historical Provider, MD  cefUROXime (CEFTIN) 250 MG tablet Take 1 tablet (250 mg total) by mouth 2 (two) times daily with a meal. Patient not taking: Reported on 08/29/2015 08/07/15   Mohammed Kindle, MD  diphenhydrAMINE (BENADRYL) 25 mg capsule Take 1 capsule (25 mg total) by mouth every 6 (six) hours as needed for itching. 10/17/15   Varney Biles, MD  doxycycline (VIBRAMYCIN) 100 MG capsule Take 1 capsule (100 mg total) by mouth 2 (two) times daily. 10/17/15   Kaicee Scarpino Kathrynn Humble, MD   BP 115/74 mmHg  Pulse 72  Temp(Src) 98.2 F (36.8 C) (Oral)  Resp 18  Ht 5' 1.5" (1.562 m)  Wt 100 lb 4 oz (45.473 kg)  BMI 18.64 kg/m2  SpO2 100% Physical Exam  Constitutional: She is oriented to person, place, and time. She appears well-developed.  HENT:  Head: Normocephalic and atraumatic.  Eyes: EOM are normal.  Neck: Normal range of motion. Neck supple. No JVD present.  Cardiovascular: Normal rate.   No murmur heard. Pulmonary/Chest: Effort normal.  Abdominal: Bowel sounds are normal.  Musculoskeletal: She exhibits no edema or tenderness.  Elbow ROM is intact, no edema.  Neurological: She is alert and oriented to person, place, and time. No cranial nerve deficit. Coordination normal.  Cerebellar exam is normal (finger to nose) Sensory exam normal for bilateral upper and lower extremities - and patient is able to discriminate between sharp and dull. Motor exam is 4+/5   Skin: Skin is warm and dry. Rash noted.  Malar rash to the face  Nursing note and vitals reviewed.   ED Course  Procedures (including critical care time) Labs Review Labs Reviewed  CBC WITH DIFFERENTIAL/PLATELET - Abnormal; Notable for the following:    RBC 3.86 (*)    Monocytes Absolute 1.1 (*)    All other components within normal limits  BASIC METABOLIC PANEL - Abnormal; Notable for the following:    Potassium 3.1 (*)    Chloride 99 (*)     Calcium 8.2 (*)    All other components within normal limits  ROCKY MTN SPOTTED FVR ABS PNL(IGG+IGM)  B. BURGDORFI ANTIBODIES    Imaging Review No results found. I have personally reviewed and evaluated these images and lab results as part of my medical decision-making.   EKG Interpretation None      MDM   Final diagnoses:  Tick bite  Migraine without status migrainosus, not intractable, unspecified migraine type    Pt comes in with cc of headache. Hx of migraines and the current headache is typical. Will start with headache cocktail. PT also c/o  tick bite. She is outside the 72 hour period for prophylactic dose. She is now c/o arthralgia, non specific rash and itching. Suspect some hypersensitivity - with the rash and itching, but arthralgia could be early localized lyme. Will get lyme studies ordered, but will start pt on doxy.   @8 :00: pain has improved significantly. Will d.c.   Varney Biles, MD 10/17/15 [phone]

## 2015-10-17 NOTE — ED Notes (Signed)
Headache for 3-4 days.  She was bitten by a tick last Friday  Also c/o nausea and itching all over her body.  She had dramamine for nause and a benadryl  approx one hour ago for itching  L;mp none

## 2015-10-17 NOTE — Discharge Instructions (Signed)
All the results in the ER are normal, labs and imaging. The workup in the ER is not complete, and is limited to screening for life threatening and emergent conditions only, so please see a primary care doctor for further evaluation.  Please rake the antibiotics. Continue benadryl for itching.  Please return to the ER if your symptoms worsen; you have increased pain, rash, fevers, chills, inability to keep any medications down, confusion. Otherwise see the outpatient doctor as requested.   Migraine Headache A migraine headache is an intense, throbbing pain on one or both sides of your head. A migraine can last for 30 minutes to several hours. CAUSES  The exact cause of a migraine headache is not always known. However, a migraine may be caused when nerves in the brain become irritated and release chemicals that cause inflammation. This causes pain. Certain things may also trigger migraines, such as:  Alcohol.  Smoking.  Stress.  Menstruation.  Aged cheeses.  Foods or drinks that contain nitrates, glutamate, aspartame, or tyramine.  Lack of sleep.  Chocolate.  Caffeine.  Hunger.  Physical exertion.  Fatigue.  Medicines used to treat chest pain (nitroglycerine), birth control pills, estrogen, and some blood pressure medicines. SIGNS AND SYMPTOMS  Pain on one or both sides of your head.  Pulsating or throbbing pain.  Severe pain that prevents daily activities.  Pain that is aggravated by any physical activity.  Nausea, vomiting, or both.  Dizziness.  Pain with exposure to bright lights, loud noises, or activity.  General sensitivity to bright lights, loud noises, or smells. Before you get a migraine, you may get warning signs that a migraine is coming (aura). An aura may include: 1. Seeing flashing lights. 2. Seeing bright spots, halos, or zigzag lines. 3. Having tunnel vision or blurred vision. 4. Having feelings of numbness or tingling. 5. Having trouble  talking. 6. Having muscle weakness. DIAGNOSIS  A migraine headache is often diagnosed based on:  Symptoms.  Physical exam.  A CT scan or MRI of your head. These imaging tests cannot diagnose migraines, but they can help rule out other causes of headaches. TREATMENT Medicines may be given for pain and nausea. Medicines can also be given to help prevent recurrent migraines.  HOME CARE INSTRUCTIONS  Only take over-the-counter or prescription medicines for pain or discomfort as directed by your health care provider. The use of long-term narcotics is not recommended.  Lie down in a dark, quiet room when you have a migraine.  Keep a journal to find out what may trigger your migraine headaches. For example, write down:  What you eat and drink.  How much sleep you get.  Any change to your diet or medicines.  Limit alcohol consumption.  Quit smoking if you smoke.  Get 7-9 hours of sleep, or as recommended by your health care provider.  Limit stress.  Keep lights dim if bright lights bother you and make your migraines worse. SEEK IMMEDIATE MEDICAL CARE IF:   Your migraine becomes severe.  You have a fever.  You have a stiff neck.  You have vision loss.  You have muscular weakness or loss of muscle control.  You start losing your balance or have trouble walking.  You feel faint or pass out.  You have severe symptoms that are different from your first symptoms. MAKE SURE YOU:   Understand these instructions.  Will watch your condition.  Will get help right away if you are not doing well or get worse.  This information is not intended to replace advice given to you by your health care provider. Make sure you discuss any questions you have with your health care provider.   Document Released: 06/10/2005 Document Revised: 07/01/2014 Document Reviewed: 02/15/2013 Elsevier Interactive Patient Education 2016 [employer] Ticks are insects that  attach themselves to the skin and draw blood for food. There are various types of ticks. Common types include wood ticks and deer ticks. Most ticks live in shrubs and grassy areas. Ticks can climb onto your body when you make contact with leaves or grass where the tick is waiting. The most common places on the body for ticks to attach themselves are the scalp, neck, armpits, waist, and groin. Most tick bites are harmless, but sometimes ticks carry germs that cause diseases. These germs can be spread to a person during the tick's feeding process. The chance of a disease spreading through a tick bite depends on:   The type of tick.  Time of year.   How long the tick is attached.   Geographic location.  HOW CAN YOU PREVENT TICK BITES? Take these steps to help prevent tick bites when you are outdoors:  Wear protective clothing. Long sleeves and long pants are best.   Wear white clothes so you can see ticks more easily.  Tuck your pant legs into your socks.   If walking on a trail, stay in the middle of the trail to avoid brushing against bushes.  Avoid walking through areas with long grass.  Put insect repellent on all exposed skin and along boot tops, pant legs, and sleeve cuffs.   Check clothing, hair, and skin repeatedly and before going inside.   Brush off any ticks that are not attached.  Take a shower or bath as soon as possible after being outdoors.  WHAT IS THE PROPER WAY TO REMOVE A TICK? Ticks should be removed as soon as possible to help prevent diseases caused by tick bites. 7. If latex gloves are available, put them on before trying to remove a tick.  8. Using fine-point tweezers, grasp the tick as close to the skin as possible. You may also use curved forceps or a tick removal tool. Grasp the tick as close to its head as possible. Avoid grasping the tick on its body. 9. Pull gently with steady upward pressure until the tick lets go. Do not twist the tick or jerk  it suddenly. This may break off the tick's head or mouth parts. 10. Do not squeeze or crush the tick's body. This could force disease-carrying fluids from the tick into your body.  11. After the tick is removed, wash the bite area and your hands with soap and water or other disinfectant such as alcohol. 12. Apply a small amount of antiseptic cream or ointment to the bite site.  13. Wash and disinfect any instruments that were used.  Do not try to remove a tick by applying a hot match, petroleum jelly, or fingernail polish to the tick. These methods do not work and may increase the chances of disease being spread from the tick bite.  WHEN SHOULD YOU SEEK MEDICAL CARE? Contact your health care provider if you are unable to remove a tick from your skin or if a part of the tick breaks off and is stuck in the skin.  After a tick bite, you need to be aware of signs and symptoms that could be related to diseases spread by ticks.  Contact your health care provider if you develop any of the following in the days or weeks after the tick bite:  Unexplained fever.  Rash. A circular rash that appears days or weeks after the tick bite may indicate the possibility of Lyme disease. The rash may resemble a target with a bull's-eye and may occur at a different part of your body than the tick bite.  Redness and swelling in the area of the tick bite.   Tender, swollen lymph glands.   Diarrhea.   Weight loss.   Cough.   Fatigue.   Muscle, joint, or bone pain.   Abdominal pain.   Headache.   Lethargy or a change in your level of consciousness.  Difficulty walking or moving your legs.   Numbness in the legs.   Paralysis.  Shortness of breath.   Confusion.   Repeated vomiting.    This information is not intended to replace advice given to you by your health care provider. Make sure you discuss any questions you have with your health care provider.   Document Released:  06/07/2000 Document Revised: 07/01/2014 Document Reviewed: 11/18/2012 Elsevier Interactive Patient Education [employer].

## 2015-10-18 LAB — B. BURGDORFI ANTIBODIES

## 2015-10-18 LAB — ROCKY MTN SPOTTED FVR ABS PNL(IGG+IGM)
RMSF IgG: NEGATIVE
RMSF IgM: 0.3 index (ref 0.00–0.89)

## 2015-10-26 ENCOUNTER — Encounter: Payer: Self-pay | Admitting: Pain Medicine

## 2015-10-26 ENCOUNTER — Ambulatory Visit: Payer: Medicare Other | Attending: Pain Medicine | Admitting: Pain Medicine

## 2015-10-26 VITALS — BP 115/72 | HR 79 | Temp 98.1°F | Resp 16 | Ht 61.0 in | Wt 100.0 lb

## 2015-10-26 DIAGNOSIS — G43909 Migraine, unspecified, not intractable, without status migrainosus: Secondary | ICD-10-CM | POA: Insufficient documentation

## 2015-10-26 DIAGNOSIS — M533 Sacrococcygeal disorders, not elsewhere classified: Secondary | ICD-10-CM | POA: Diagnosis not present

## 2015-10-26 DIAGNOSIS — M5136 Other intervertebral disc degeneration, lumbar region: Secondary | ICD-10-CM | POA: Diagnosis not present

## 2015-10-26 DIAGNOSIS — M5481 Occipital neuralgia: Secondary | ICD-10-CM | POA: Insufficient documentation

## 2015-10-26 DIAGNOSIS — G588 Other specified mononeuropathies: Secondary | ICD-10-CM

## 2015-10-26 DIAGNOSIS — M546 Pain in thoracic spine: Secondary | ICD-10-CM | POA: Diagnosis present

## 2015-10-26 DIAGNOSIS — M4186 Other forms of scoliosis, lumbar region: Secondary | ICD-10-CM | POA: Diagnosis not present

## 2015-10-26 DIAGNOSIS — G43109 Migraine with aura, not intractable, without status migrainosus: Secondary | ICD-10-CM

## 2015-10-26 DIAGNOSIS — M160 Bilateral primary osteoarthritis of hip: Secondary | ICD-10-CM

## 2015-10-26 DIAGNOSIS — M5134 Other intervertebral disc degeneration, thoracic region: Secondary | ICD-10-CM

## 2015-10-26 DIAGNOSIS — S46812D Strain of other muscles, fascia and tendons at shoulder and upper arm level, left arm, subsequent encounter: Secondary | ICD-10-CM

## 2015-10-26 DIAGNOSIS — M4185 Other forms of scoliosis, thoracolumbar region: Secondary | ICD-10-CM | POA: Insufficient documentation

## 2015-10-26 DIAGNOSIS — Z9889 Other specified postprocedural states: Secondary | ICD-10-CM | POA: Insufficient documentation

## 2015-10-26 DIAGNOSIS — M5137 Other intervertebral disc degeneration, lumbosacral region: Secondary | ICD-10-CM

## 2015-10-26 DIAGNOSIS — M5030 Other cervical disc degeneration, unspecified cervical region: Secondary | ICD-10-CM | POA: Diagnosis not present

## 2015-10-26 DIAGNOSIS — M5416 Radiculopathy, lumbar region: Secondary | ICD-10-CM | POA: Diagnosis not present

## 2015-10-26 DIAGNOSIS — M542 Cervicalgia: Secondary | ICD-10-CM | POA: Diagnosis present

## 2015-10-26 DIAGNOSIS — G43119 Migraine with aura, intractable, without status migrainosus: Secondary | ICD-10-CM

## 2015-10-26 DIAGNOSIS — M47817 Spondylosis without myelopathy or radiculopathy, lumbosacral region: Secondary | ICD-10-CM | POA: Diagnosis not present

## 2015-10-26 DIAGNOSIS — G43009 Migraine without aura, not intractable, without status migrainosus: Secondary | ICD-10-CM | POA: Diagnosis not present

## 2015-10-26 DIAGNOSIS — M79606 Pain in leg, unspecified: Secondary | ICD-10-CM | POA: Diagnosis present

## 2015-10-26 DIAGNOSIS — S4382XD Sprain of other specified parts of left shoulder girdle, subsequent encounter: Secondary | ICD-10-CM

## 2015-10-26 MED ORDER — DIAZEPAM 5 MG PO TABS
ORAL_TABLET | ORAL | Status: DC
Start: 2015-10-26 — End: 2015-11-27

## 2015-10-26 MED ORDER — OXYCODONE HCL 5 MG PO TABS: ORAL_TABLET | ORAL | Status: DC

## 2015-10-26 NOTE — Patient Instructions (Addendum)
PLAN   Continue present medication  diazepam and oxycodone. Please discuss diazepam with your primary care physician and with your neurologist Dr. Larose Kells. We need to consider decreasing and discontinuing diazepam provided your physicians agree  F/U PCP for evaliation of  BP Lyme's disease and general medical  Condition as discussed  F/U surgical evaluation as planned and Synvisc injections of knee as previously discussed  F/U neurological evaluation as planned with Dr.Baute as we discussed. Please discuss decreasing and discontinuing diazepam with Dr.Baute    Psych eval as discussed .  May consider radiofrequency rhizolysis or intraspinal procedures pending response to present treatment and F/U evaluation   Patient to call [facility] should patient have concerns prior to scheduled return appointment.

## 2015-10-26 NOTE — Progress Notes (Signed)
Safety precautions to be maintained throughout the outpatient stay will include: orient to surroundings, keep bed in low position, maintain call bell within reach at all times, provide assistance with transfer out of bed and ambulation.  

## 2015-10-27 NOTE — Progress Notes (Signed)
Subjective:    Patient ID: Sharon Welch, female    DOB: [birth year], 45 y.o.   MRN: [id number]  HPI  The patient is a 45 year old female who returns to pain management for further evaluation and treatment of pain involving the neck entire back upper and lower extremity regions. The patient states that she has severe spasms occurring in the mid back region. The patient is status post Harrington rod placement for scoliosis of the thoracic oh lumbar spine. The patient has had significant improvement of pain with treatment in pain management Center. The patient states that she was out with her friend and during outdoor activities the patient was bitten by a tick and contracted Lyme's disease. We discussed patient's condition and patient continues under the care of her primary care physician for her Lyme's disease which resulted from a tick bite in the region of the inguinal region on the left according to patient's history we will continue medications as prescribed and have informed patient that we wish to have patient decrease the use of Valium and eventually discontinue Valium provided patient's neurologist and primary care physician degree The patient was with understanding and agreement suggested treatment plan  Review of Systems     Objective:   Physical Exam  There was tends to palpation of paraspinal muscular treat the cervical region cervical facet region palpation which reproduces pain of moderate degree there was moderate tenderness of the trapezius levator scapula and rhomboid musculature regions. Palpation of the splenius capitis and occipitalis musculature regions reproduce moderate discomfort as well the patient appeared to be with moderate tenderness of the acromioclavicular and glenohumeral joint region and unremarkable Spurling's maneuver. There was moderately severe tenderness to palpation of the lower thoracic paraspinal musculature region and the lumbar paraspinal musculature region.  Lateral bending rotation extension and palpation of the lumbar facets reproduced moderately severe discomfort. Straight leg raise was tolerates approximately 30 without a definite increase of pain with dorsiflexion noted. EHL strength appeared to be decreased. DTRs were difficult to elicit patient had difficulty relaxing. The knees were tenderness to palpation with crepitus of the knees with negative anterior and posterior drawer signs without ballottement of the patella. There was tenderness over the PSIS and PII S region a moderate degree and mild tenderness of the greater trochanteric region iliotibial band region. There was negative clonus and negative Homans.Migraine headaches        Assessment & Plan:      Migraine headaches  Bilateral occipital neuralgia  Degenerative disc disease thoracic spine Thoracic facet syndrome Status post Harrington rods thoracic and lumbar regions for severe scoliosis of the thoracolumbar spine  Degenerative disc disease lumbar spine Lumbar facet syndrome Status post Harrington rods for severe scoliosis of the thoracic and lumbar regions  Degenerative disc disease cervical spine  Cervical facet syndrome      PLAN   Continue present medication  diazepam and oxycodone. Please discuss diazepam with your primary care physician and with your neurologist Dr. Larose Kells. We need to consider decreasing and discontinuing diazepam provided your physicians agree  F/U PCP for evaliation of  BP Lyme's disease and general medical  Condition as discussed  F/U surgical evaluation as planned and Synvisc injections of knee as previously discussed  F/U neurological evaluation as planned with Dr.Baute as we discussed. Please discuss decreasing and discontinuing diazepam with Dr.Baute    Psych eval as discussed .  May consider radiofrequency rhizolysis or intraspinal procedures pending response to present treatment and F/U evaluation  Patient to call [facility] should patient have concerns prior to scheduled return appointment.

## 2015-11-27 ENCOUNTER — Ambulatory Visit: Payer: Medicare Other | Attending: Pain Medicine | Admitting: Pain Medicine

## 2015-11-27 ENCOUNTER — Encounter: Payer: Self-pay | Admitting: Pain Medicine

## 2015-11-27 VITALS — BP 145/73 | HR 64 | Temp 97.9°F | Resp 16 | Ht 61.0 in | Wt 94.0 lb

## 2015-11-27 DIAGNOSIS — G43909 Migraine, unspecified, not intractable, without status migrainosus: Secondary | ICD-10-CM | POA: Insufficient documentation

## 2015-11-27 DIAGNOSIS — R51 Headache: Secondary | ICD-10-CM | POA: Diagnosis present

## 2015-11-27 DIAGNOSIS — M5134 Other intervertebral disc degeneration, thoracic region: Secondary | ICD-10-CM | POA: Diagnosis not present

## 2015-11-27 DIAGNOSIS — G548 Other nerve root and plexus disorders: Secondary | ICD-10-CM | POA: Diagnosis not present

## 2015-11-27 DIAGNOSIS — S4382XD Sprain of other specified parts of left shoulder girdle, subsequent encounter: Secondary | ICD-10-CM

## 2015-11-27 DIAGNOSIS — M542 Cervicalgia: Secondary | ICD-10-CM | POA: Diagnosis present

## 2015-11-27 DIAGNOSIS — G588 Other specified mononeuropathies: Secondary | ICD-10-CM

## 2015-11-27 DIAGNOSIS — M47817 Spondylosis without myelopathy or radiculopathy, lumbosacral region: Secondary | ICD-10-CM | POA: Diagnosis not present

## 2015-11-27 DIAGNOSIS — M4186 Other forms of scoliosis, lumbar region: Secondary | ICD-10-CM | POA: Insufficient documentation

## 2015-11-27 DIAGNOSIS — M5030 Other cervical disc degeneration, unspecified cervical region: Secondary | ICD-10-CM

## 2015-11-27 DIAGNOSIS — M4185 Other forms of scoliosis, thoracolumbar region: Secondary | ICD-10-CM | POA: Diagnosis not present

## 2015-11-27 DIAGNOSIS — M5137 Other intervertebral disc degeneration, lumbosacral region: Secondary | ICD-10-CM

## 2015-11-27 DIAGNOSIS — M545 Low back pain: Secondary | ICD-10-CM | POA: Diagnosis present

## 2015-11-27 DIAGNOSIS — S46812D Strain of other muscles, fascia and tendons at shoulder and upper arm level, left arm, subsequent encounter: Secondary | ICD-10-CM

## 2015-11-27 DIAGNOSIS — M5136 Other intervertebral disc degeneration, lumbar region: Secondary | ICD-10-CM | POA: Insufficient documentation

## 2015-11-27 DIAGNOSIS — G43119 Migraine with aura, intractable, without status migrainosus: Secondary | ICD-10-CM

## 2015-11-27 DIAGNOSIS — M160 Bilateral primary osteoarthritis of hip: Secondary | ICD-10-CM

## 2015-11-27 DIAGNOSIS — G43009 Migraine without aura, not intractable, without status migrainosus: Secondary | ICD-10-CM | POA: Diagnosis not present

## 2015-11-27 DIAGNOSIS — Z9689 Presence of other specified functional implants: Secondary | ICD-10-CM | POA: Insufficient documentation

## 2015-11-27 DIAGNOSIS — G43109 Migraine with aura, not intractable, without status migrainosus: Secondary | ICD-10-CM

## 2015-11-27 DIAGNOSIS — M5481 Occipital neuralgia: Secondary | ICD-10-CM | POA: Diagnosis not present

## 2015-11-27 DIAGNOSIS — M5416 Radiculopathy, lumbar region: Secondary | ICD-10-CM | POA: Diagnosis not present

## 2015-11-27 DIAGNOSIS — M533 Sacrococcygeal disorders, not elsewhere classified: Secondary | ICD-10-CM | POA: Diagnosis not present

## 2015-11-27 MED ORDER — OXYCODONE HCL 5 MG PO TABS: ORAL_TABLET | ORAL | Status: DC

## 2015-11-27 MED ORDER — DIAZEPAM 5 MG PO TABS: ORAL_TABLET | ORAL | Status: DC

## 2015-11-27 NOTE — Progress Notes (Signed)
Subjective:    Patient ID: Sharon Welch, female    DOB: 1971/04/13, 45 y.o.   MRN: [id number]  HPI  The patient is a 45 year old female who returns to pain management for further evaluation and treatment of pain involving the neck associated with headaches as well as pain involving the mid back lower back and lower extremity region. The patient is a significant pain involving the mid back region and lower back regions with is aggravated by standing walking twisting turning maneuvers. The patient is status post surgical intervention of the thoracic oh lumbar region and is with severely disabling pain of the thoracic oh lumbar region. We discussed patient's condition and will proceed with lumbar facet, medial branch nerve, blocks to be performed at time return appointment in attempt to decrease severity of patient's symptoms. Patient will continue: Don't and we have discussed patient decreasing Valium and discontinuing Valium. The patient will further address discontinuing Valium and replacing Valium with another medication with her neurologist and primary care physician as discussed and as explained to patient on today's visit we will proceed with lumbar facet, medial branch nerve, blocks at time return appointment in attempt to decrease severity of patient's symptoms, minimize progression of symptoms, and avoid the need for more involved treatment. Denied any trauma change in events of daily living the call significant change in symptomatology.    Review of Systems     Objective:   Physical Exam   There was tends to palpation of paraspinal muscular treat and cervical region cervical facet region palpation which be produced pain of moderate degree with moderate tenderness of the splenius capitis and occipitalis regions. Palpation over the thoracic facet thoracic paraspinal muscular region reproduced moderate to moderately severe discomfort as well was tenderness of the acromioclavicular and  glenohumeral joint region a moderate degree and patient appeared to be with unremarkable Spurling's maneuver with slightly decreased grip strength and Tinel and Phalen's maneuver reproducing minimal discomfort. There was tenderness to palpation over the lumbar paraspinal musculatures and lumbar facet region of severe degree with lateral bending rotation extension and palpation of the lumbar facets reproducing severe discomfort. There was severe tenderness of the PSIS and PII S region as well as the gluteal and piriformis musculature region with mild tenderness to palpation of the greater trochanteric region iliotibial band region. Straight leg raise was tolerates approximately 30 without a definite increased pain with dorsiflexion noted. There was negative clonus negative Homans. No sensory deficit or dermatomal distribution detected. Abdomen soft nontender with no costovertebral tenderness noted the predominant and most severe pain was reproduced with palpation over the lumbar facet, lumbar paraspinal muscles region.        Assessment & Plan:       Migraine headaches  Bilateral occipital neuralgia  Degenerative disc disease thoracic spine Thoracic facet syndrome Status post Harrington rods thoracic and lumbar regions for severe scoliosis of the thoracolumbar spine  Degenerative disc disease lumbar spine Lumbar facet syndrome Status post Harrington rods for severe scoliosis of the thoracic and lumbar regions  Degenerative disc disease cervical spine  Cervical facet syndrome      PLAN   Continue present medication  diazepam and oxycodone. Please discuss diazepam with your primary care physician and with your neurologist Dr. Larose Kells. We need to consider decreasing and discontinuing diazepam provided your physicians agree. Please discuss decreasing and discontinuing Valium with your neurologist and primary care physician as we discussed today  Lumbar facet, medial branch nerve,  blocks to be  performed at time return appointment  F/U PCP for evaliation of BP and general medical condition as discussed  F/U surgical evaluation as planned and Synvisc injections of knee as previously discussed  F/U neurological evaluation as planned with Dr.Baute as we discussed. Please discuss decreasing and discontinuing diazepam with Dr.Baute    Psych eval as discussed .  May consider radiofrequency rhizolysis or intraspinal procedures pending response to present treatment and F/U evaluation   Patient to call [facility] Center should patient have concerns prior to scheduled return appointment

## 2015-11-27 NOTE — Patient Instructions (Addendum)
PLAN   Continue present medication  diazepam and oxycodone. Please discuss diazepam with your primary care physician and with your neurologist Dr. Larose Kells. We need to consider decreasing and discontinuing diazepam provided your physicians agree. Please discuss decreasing and discontinuing Valium with your neurologist and primary care physician as we discussed today  Lumbar facet, medial branch nerve, blocks to be performed at time return appointment  F/U PCP for evaliation of BP and general medical condition as discussed  F/U surgical evaluation as planned and Synvisc injections of knee as previously discussed  F/U neurological evaluation as planned with Dr.Baute as we discussed. Please discuss decreasing and discontinuing diazepam with Dr.Baute    Psych eval as discussed .  May consider radiofrequency rhizolysis or intraspinal procedures pending response to present treatment and F/U evaluation   Patient to call Pain Management Center should patient have concerns prior to scheduled return appointment.Facet Blocks Patient Information  Description: The facets are joints in the spine between the vertebrae.  Like any joints in the body, facets can become irritated and painful.  Arthritis can also effect the facets.  By injecting steroids and local anesthetic in and around these joints, we can temporarily block the nerve supply to them.  Steroids act directly on irritated nerves and tissues to reduce selling and inflammation which often leads to decreased pain.  Facet blocks may be done anywhere along the spine from the neck to the low back depending upon the location of your pain.   After numbing the skin with local anesthetic (like Novocaine), a small needle is passed onto the facet joints under x-ray guidance.  You may experience a sensation of pressure while this is being done.  The entire block usually lasts about 15-25 minutes.   Conditions which may be treated by facet blocks:   Low  back/buttock pain  Neck/shoulder pain  Certain types of headaches  Preparation for the injection:  1. Do not eat any solid food or dairy products within 8 hours of your appointment. 2. You may drink clear liquid up to 3 hours before appointment.  Clear liquids include water, black coffee, juice or soda.  No milk or cream please. 3. You may take your regular medication, including pain medications, with a sip of water before your appointment.  Diabetics should hold regular insulin (if taken separately) and take 1/2 normal NPH dose the morning of the procedure.  Carry some sugar containing items with you to your appointment. 4. A driver must accompany you and be prepared to drive you home after your procedure. 5. Bring all your current medications with you. 6. An IV may be inserted and sedation may be given at the discretion of the physician. 7. A blood pressure cuff, EKG and other monitors will often be applied during the procedure.  Some patients may need to have extra oxygen administered for a short period. 8. You will be asked to provide medical information, including your allergies and medications, prior to the procedure.  We must know immediately if you are taking blood thinners (like Coumadin/Warfarin) or if you are allergic to IV iodine contrast (dye).  We must know if you could possible be pregnant.  Possible side-effects:   Bleeding from needle site  Infection (rare, may require surgery)  Nerve injury (rare)  Numbness & tingling (temporary)  Difficulty urinating (rare, temporary)  Spinal headache (a headache worse with upright posture)  Light-headedness (temporary)  Pain at injection site (serveral days)  Decreased blood pressure (rare, temporary)  Weakness  in arm/leg (temporary)  Pressure sensation in back/neck (temporary)   Call if you experience:   Fever/chills associated with headache or increased back/neck pain  Headache worsened by an upright  position  New onset, weakness or numbness of an extremity below the injection site  Hives or difficulty breathing (go to the emergency room)  Inflammation or drainage at the injection site(s)  Severe back/neck pain greater than usual  New symptoms which are concerning to you  Please note:  Although the local anesthetic injected can often make your back or neck feel good for several hours after the injection, the pain will likely return. It takes 3-7 days for steroids to work.  You may not notice any pain relief for at least one week.  If effective, we will often do a series of 2-3 injections spaced 3-6 weeks apart to maximally decrease your pain.  After the initial series, you may be a candidate for a more permanent nerve block of the facets.  If you have any questions, please call #[phone]) [place]  What are the risk, side effects and possible complications? Generally speaking, most procedures are safe.  However, with any procedure there are risks, side effects, and the possibility of complications.  The risks and complications are dependent upon the sites that are lesioned, or the type of nerve block to be performed.  The closer the procedure is to the spine, the more serious the risks are.  Great care is taken when placing the radio frequency needles, block needles or lesioning probes, but sometimes complications can occur. 1. Infection: Any time there is an injection through the skin, there is a risk of infection.  This is why sterile conditions are used for these blocks.  There are four possible types of infection. 1. Localized skin infection. 2. Central Nervous System Infection-This can be in the form of Meningitis, which can be deadly. 3. Epidural Infections-This can be in the form of an epidural abscess, which can cause pressure inside of the spine, causing compression of the spinal cord with subsequent  paralysis. This would require an emergency surgery to decompress, and there are no guarantees that the patient would recover from the paralysis. 4. Discitis-This is an infection of the intervertebral discs.  It occurs in about 1% of discography procedures.  It is difficult to treat and it may lead to surgery.        2. Pain: the needles have to go through skin and soft tissues, will cause soreness.       3. Damage to internal structures:  The nerves to be lesioned may be near blood vessels or    other nerves which can be potentially damaged.       4. Bleeding: Bleeding is more common if the patient is taking blood thinners such as  aspirin, Coumadin, Ticiid, Plavix, etc., or if he/she have some genetic predisposition  such as hemophilia. Bleeding into the spinal canal can cause compression of the spinal  cord with subsequent paralysis.  This would require an emergency surgery to  decompress and there are no guarantees that the patient would recover from the  paralysis.       5. Pneumothorax:  Puncturing of a lung is a possibility, every time a needle is introduced in  the area of the chest or upper back.  Pneumothorax refers to free air around the  collapsed lung(s), inside of the thoracic cavity (chest cavity).  Another two  possible  complications related to a similar event would include: Hemothorax and Chylothorax.   These are variations of the Pneumothorax, where instead of air around the collapsed  lung(s), you may have blood or chyle, respectively.       6. Spinal headaches: They may occur with any procedures in the area of the spine.       7. Persistent CSF (Cerebro-Spinal Fluid) leakage: This is a rare problem, but may occur  with prolonged intrathecal or epidural catheters either due to the formation of a fistulous  track or a dural tear.       8. Nerve damage: By working so close to the spinal cord, there is always a possibility of  nerve damage, which could be as serious as a permanent spinal  cord injury with  paralysis.       9. Death:  Although rare, severe deadly allergic reactions known as "Anaphylactic  reaction" can occur to any of the medications used.      10. Worsening of the symptoms:  We can always make thing worse.  What are the chances of something like this happening? Chances of any of this occuring are extremely low.  By statistics, you have more of a chance of getting killed in a motor vehicle accident: while driving to the hospital than any of the above occurring .  Nevertheless, you should be aware that they are possibilities.  In general, it is similar to taking a shower.  Everybody knows that you can slip, hit your head and get killed.  Does that mean that you should not shower again?  Nevertheless always keep in mind that statistics do not mean anything if you happen to be on the wrong side of them.  Even if a procedure has a 1 (one) in a 1,000,000 (million) chance of going wrong, it you happen to be that one..Also, keep in mind that by statistics, you have more of a chance of having something go wrong when taking medications.  Who should not have this procedure? If you are on a blood thinning medication (e.g. Coumadin, Plavix, see list of "Blood Thinners"), or if you have an active infection going on, you should not have the procedure.  If you are taking any blood thinners, please inform your physician.  How should I prepare for this procedure?  Do not eat or drink anything at least six hours prior to the procedure.  Bring a driver with you .  It cannot be a taxi.  Come accompanied by an adult that can drive you back, and that is strong enough to help you if your legs get weak or numb from the local anesthetic.  Take all of your medicines the morning of the procedure with just enough water to swallow them.  If you have diabetes, make sure that you are scheduled to have your procedure done first thing in the morning, whenever possible.  If you have diabetes,  take only half of your insulin dose and notify our nurse that you have done so as soon as you arrive at the clinic.  If you are diabetic, but only take blood sugar pills (oral hypoglycemic), then do not take them on the morning of your procedure.  You may take them after you have had the procedure.  Do not take aspirin or any aspirin-containing medications, at least eleven (11) days prior to the procedure.  They may prolong bleeding.  Wear loose fitting clothing that may be easy to take off  and that you would not mind if it got stained with Betadine or blood.  Do not wear any jewelry or perfume  Remove any nail coloring.  It will interfere with some of our monitoring equipment.  NOTE: Remember that this is not meant to be interpreted as a complete list of all possible complications.  Unforeseen problems may occur.  BLOOD THINNERS The following drugs contain aspirin or other products, which can cause increased bleeding during surgery and should not be taken for 2 weeks prior to and 1 week after surgery.  If you should need take something for relief of minor pain, you may take acetaminophen which is found in Tylenol,m Datril, Anacin-3 and Panadol. It is not blood thinner. The products listed below are.  Do not take any of the products listed below in addition to any listed on your instruction sheet.  A.P.C or A.P.C with Codeine Codeine Phosphate Capsules #3 Ibuprofen Ridaura  ABC compound Congesprin Imuran rimadil  Advil Cope Indocin Robaxisal  Alka-Seltzer Effervescent Pain Reliever and Antacid Coricidin or Coricidin-D  Indomethacin Rufen  Alka-Seltzer plus Cold Medicine Cosprin Ketoprofen S-A-C Tablets  Anacin Analgesic Tablets or Capsules Coumadin Korlgesic Salflex  Anacin Extra Strength Analgesic tablets or capsules CP-2 Tablets Lanoril Salicylate  Anaprox Cuprimine Capsules Levenox Salocol  Anexsia-D Dalteparin Magan Salsalate  Anodynos Darvon compound Magnesium Salicylate Sine-off   Ansaid Dasin Capsules Magsal Sodium Salicylate  Anturane Depen Capsules Marnal Soma  APF Arthritis pain formula Dewitt's Pills Measurin Stanback  Argesic Dia-Gesic Meclofenamic Sulfinpyrazone  Arthritis Bayer Timed Release Aspirin Diclofenac Meclomen Sulindac  Arthritis pain formula Anacin Dicumarol Medipren Supac  Analgesic (Safety coated) Arthralgen Diffunasal Mefanamic Suprofen  Arthritis Strength Bufferin Dihydrocodeine Mepro Compound Suprol  Arthropan liquid Dopirydamole Methcarbomol with Aspirin Synalgos  ASA tablets/Enseals Disalcid Micrainin Tagament  Ascriptin Doan's Midol Talwin  Ascriptin A/D Dolene Mobidin Tanderil  Ascriptin Extra Strength Dolobid Moblgesic Ticlid  Ascriptin with Codeine Doloprin or Doloprin with Codeine Momentum Tolectin  Asperbuf Duoprin Mono-gesic Trendar  Aspergum Duradyne Motrin or Motrin IB Triminicin  Aspirin plain, buffered or enteric coated Durasal Myochrisine Trigesic  Aspirin Suppositories Easprin Nalfon Trillsate  Aspirin with Codeine Ecotrin Regular or Extra Strength Naprosyn Uracel  Atromid-S Efficin Naproxen Ursinus  Auranofin Capsules Elmiron Neocylate Vanquish  Axotal Emagrin Norgesic Verin  Azathioprine Empirin or Empirin with Codeine Normiflo Vitamin E  Azolid Emprazil Nuprin Voltaren  Bayer Aspirin plain, buffered or children's or timed BC Tablets or powders Encaprin Orgaran Warfarin Sodium  Buff-a-Comp Enoxaparin Orudis Zorpin  Buff-a-Comp with Codeine Equegesic Os-Cal-Gesic   Buffaprin Excedrin plain, buffered or Extra Strength Oxalid   Bufferin Arthritis Strength Feldene Oxphenbutazone   Bufferin plain or Extra Strength Feldene Capsules Oxycodone with Aspirin   Bufferin with Codeine Fenoprofen Fenoprofen Pabalate or Pabalate-SF   Buffets II Flogesic Panagesic   Buffinol plain or Extra Strength Florinal or Florinal with Codeine Panwarfarin   Buf-Tabs Flurbiprofen Penicillamine   Butalbital Compound Four-way cold tablets  Penicillin   Butazolidin Fragmin Pepto-Bismol   Carbenicillin Geminisyn Percodan   Carna Arthritis Reliever Geopen Persantine   Carprofen Gold's salt Persistin   Chloramphenicol Goody's Phenylbutazone   Chloromycetin Haltrain Piroxlcam   Clmetidine heparin Plaquenil   Cllnoril Hyco-pap Ponstel   Clofibrate Hydroxy chloroquine Propoxyphen         Before stopping any of these medications, be sure to consult the physician who ordered them.  Some, such as Coumadin (Warfarin) are ordered to prevent or treat serious conditions such as "deep thrombosis", "pumonary embolisms", and  other heart problems.  The amount of time that you may need off of the medication may also vary with the medication and the reason for which you were taking it.  If you are taking any of these medications, please make sure you notify your pain physician before you undergo any procedures.          

## 2015-11-27 NOTE — Progress Notes (Signed)
Patient presents for medication management. Safety precautions to be maintained throughout the outpatient stay will include: orient to surroundings, keep bed in low position, maintain call bell within reach at all times, provide assistance with transfer out of bed and ambulation.

## 2015-12-02 LAB — TOXASSURE SELECT 13 (MW), URINE: PDF: 0

## 2015-12-03 NOTE — Progress Notes (Signed)
Quick Note:  Reviewed. ______ 

## 2015-12-06 DIAGNOSIS — M79641 Pain in right hand: Secondary | ICD-10-CM | POA: Diagnosis not present

## 2015-12-06 DIAGNOSIS — G43709 Chronic migraine without aura, not intractable, without status migrainosus: Secondary | ICD-10-CM | POA: Diagnosis not present

## 2015-12-06 DIAGNOSIS — Z79899 Other long term (current) drug therapy: Secondary | ICD-10-CM | POA: Diagnosis not present

## 2015-12-06 DIAGNOSIS — M545 Low back pain: Secondary | ICD-10-CM | POA: Diagnosis not present

## 2015-12-06 DIAGNOSIS — Z8669 Personal history of other diseases of the nervous system and sense organs: Secondary | ICD-10-CM | POA: Diagnosis not present

## 2015-12-06 DIAGNOSIS — M5481 Occipital neuralgia: Secondary | ICD-10-CM | POA: Diagnosis not present

## 2015-12-06 DIAGNOSIS — G8929 Other chronic pain: Secondary | ICD-10-CM | POA: Diagnosis not present

## 2015-12-06 DIAGNOSIS — F329 Major depressive disorder, single episode, unspecified: Secondary | ICD-10-CM | POA: Diagnosis not present

## 2015-12-06 DIAGNOSIS — G4700 Insomnia, unspecified: Secondary | ICD-10-CM | POA: Diagnosis not present

## 2015-12-06 DIAGNOSIS — M79642 Pain in left hand: Secondary | ICD-10-CM | POA: Diagnosis not present

## 2015-12-11 ENCOUNTER — Encounter: Payer: Self-pay | Admitting: Pain Medicine

## 2015-12-11 ENCOUNTER — Ambulatory Visit: Payer: Medicare Other | Attending: Pain Medicine | Admitting: Pain Medicine

## 2015-12-11 VITALS — BP 131/82 | HR 96 | Temp 97.5°F | Resp 154 | Ht 61.0 in | Wt 101.0 lb

## 2015-12-11 DIAGNOSIS — G43119 Migraine with aura, intractable, without status migrainosus: Secondary | ICD-10-CM

## 2015-12-11 DIAGNOSIS — M5134 Other intervertebral disc degeneration, thoracic region: Secondary | ICD-10-CM

## 2015-12-11 DIAGNOSIS — M4185 Other forms of scoliosis, thoracolumbar region: Secondary | ICD-10-CM | POA: Diagnosis not present

## 2015-12-11 DIAGNOSIS — M160 Bilateral primary osteoarthritis of hip: Secondary | ICD-10-CM

## 2015-12-11 DIAGNOSIS — S4382XD Sprain of other specified parts of left shoulder girdle, subsequent encounter: Secondary | ICD-10-CM

## 2015-12-11 DIAGNOSIS — Z9689 Presence of other specified functional implants: Secondary | ICD-10-CM | POA: Insufficient documentation

## 2015-12-11 DIAGNOSIS — M79606 Pain in leg, unspecified: Secondary | ICD-10-CM | POA: Diagnosis present

## 2015-12-11 DIAGNOSIS — M5137 Other intervertebral disc degeneration, lumbosacral region: Secondary | ICD-10-CM

## 2015-12-11 DIAGNOSIS — M5030 Other cervical disc degeneration, unspecified cervical region: Secondary | ICD-10-CM

## 2015-12-11 DIAGNOSIS — M51379 Other intervertebral disc degeneration, lumbosacral region without mention of lumbar back pain or lower extremity pain: Secondary | ICD-10-CM

## 2015-12-11 DIAGNOSIS — M5136 Other intervertebral disc degeneration, lumbar region: Secondary | ICD-10-CM | POA: Insufficient documentation

## 2015-12-11 DIAGNOSIS — M5481 Occipital neuralgia: Secondary | ICD-10-CM

## 2015-12-11 DIAGNOSIS — G43109 Migraine with aura, not intractable, without status migrainosus: Secondary | ICD-10-CM

## 2015-12-11 DIAGNOSIS — S46812D Strain of other muscles, fascia and tendons at shoulder and upper arm level, left arm, subsequent encounter: Secondary | ICD-10-CM

## 2015-12-11 DIAGNOSIS — Z9889 Other specified postprocedural states: Secondary | ICD-10-CM | POA: Insufficient documentation

## 2015-12-11 DIAGNOSIS — M47817 Spondylosis without myelopathy or radiculopathy, lumbosacral region: Secondary | ICD-10-CM | POA: Diagnosis not present

## 2015-12-11 DIAGNOSIS — M545 Low back pain: Secondary | ICD-10-CM | POA: Diagnosis present

## 2015-12-11 DIAGNOSIS — G588 Other specified mononeuropathies: Secondary | ICD-10-CM

## 2015-12-11 MED ORDER — ORPHENADRINE CITRATE 30 MG/ML IJ SOLN
60.0000 mg | Freq: Once | INTRAMUSCULAR | Status: AC
  Administered 2015-12-11: 60 mg via INTRAMUSCULAR
  Filled 2015-12-11: qty 2

## 2015-12-11 MED ORDER — TRIAMCINOLONE ACETONIDE 40 MG/ML IJ SUSP
40.0000 mg | Freq: Once | INTRAMUSCULAR | Status: AC
  Administered 2015-12-11: 40 mg
  Filled 2015-12-11: qty 1

## 2015-12-11 MED ORDER — CEFUROXIME AXETIL 250 MG PO TABS: 250.0000 mg | ORAL_TABLET | Freq: Two times a day (BID) | ORAL | Status: DC

## 2015-12-11 MED ORDER — LACTATED RINGERS IV SOLN
1000.0000 mL | INTRAVENOUS | Status: DC
  Administered 2015-12-11: 1000 mL via INTRAVENOUS

## 2015-12-11 MED ORDER — FENTANYL CITRATE (PF) 100 MCG/2ML IJ SOLN
100.0000 ug | Freq: Once | INTRAMUSCULAR | Status: AC
  Administered 2015-12-11: 100 ug via INTRAVENOUS
  Filled 2015-12-11: qty 2

## 2015-12-11 MED ORDER — SODIUM CHLORIDE 0.9% FLUSH
20.0000 mL | Freq: Once | INTRAVENOUS | Status: AC
  Administered 2015-12-11: 20 mL

## 2015-12-11 MED ORDER — BUPIVACAINE HCL (PF) 0.25 % IJ SOLN
30.0000 mL | Freq: Once | INTRAMUSCULAR | Status: AC
  Administered 2015-12-11: 30 mL
  Filled 2015-12-11: qty 30

## 2015-12-11 MED ORDER — MIDAZOLAM HCL 5 MG/5ML IJ SOLN
5.0000 mg | Freq: Once | INTRAMUSCULAR | Status: AC
  Administered 2015-12-11: 5 mg via INTRAVENOUS
  Filled 2015-12-11: qty 5

## 2015-12-11 MED ORDER — CEFAZOLIN SODIUM 1 G IJ SOLR
INTRAMUSCULAR | Status: AC
  Administered 2015-12-11: 13:00:00
  Filled 2015-12-11: qty 10

## 2015-12-11 MED ORDER — CEFAZOLIN SODIUM 1-5 GM-% IV SOLN
1.0000 g | Freq: Once | INTRAVENOUS | Status: AC
  Administered 2015-12-11: 1 g via INTRAVENOUS

## 2015-12-11 MED ORDER — LIDOCAINE HCL (PF) 1 % IJ SOLN
10.0000 mL | Freq: Once | INTRAMUSCULAR | Status: AC
  Administered 2015-12-11: 10 mL via SUBCUTANEOUS

## 2015-12-11 NOTE — Patient Instructions (Addendum)
PLAN   Continue present medication  diazepam and oxycodone.  We need to consider decreasing and discontinuing diazepam provided your physicians agree. Please discuss decreasing and discontinuing Valium with your neurologist Dr. Larose Kells  and primary care physician as we discussed today. Please obtain Ceftin antibiotic today and begin taking Ceftin antibiotic today as prescribed  F/U PCP for evaliation of BP and general medical condition as discussed  F/U surgical evaluation as planned and Synvisc injections of knee as previously discussed  F/U neurological evaluation as planned with Dr.Baute as we discussed. Please discuss decreasing and discontinuing diazepam with Dr.Baute    Psych eval as discussed .  May consider radiofrequency rhizolysis or intraspinal procedures pending response to present treatment and F/U evaluation   Patient to call Pain Management Center should patient have concerns prior to scheduled return appointment.Pain Management Discharge Instructions  General Discharge Instructions :  If you need to reach your doctor call: Monday-Friday 8:00 am - 4:00 pm at [phone] or toll free [phone].  After clinic hours [phone] to have operator reach doctor.  Bring all of your medication bottles to all your appointments in the pain clinic.  To cancel or reschedule your appointment with Pain Management please remember to call 24 hours in advance to avoid a fee.  Refer to the educational materials which you have been given on: General Risks, I had my Procedure. Discharge Instructions, Post Sedation.  Post Procedure Instructions:  The drugs you were given will stay in your system until tomorrow, so for the next 24 hours you should not drive, make any legal decisions or drink any alcoholic beverages.  You may eat anything you prefer, but it is better to start with liquids then soups and crackers, and gradually work up to solid foods.  Please notify your doctor  immediately if you have any unusual bleeding, trouble breathing or pain that is not related to your normal pain.  Depending on the type of procedure that was done, some parts of your body may feel week and/or numb.  This usually clears up by tonight or the next day.  Walk with the use of an assistive device or accompanied by an adult for the 24 hours.  You may use ice on the affected area for the first 24 hours.  Put ice in a Ziploc bag and cover with a towel and place against area 15 minutes on 15 minutes off.  You may switch to heat after 24 hours.Pain Management Discharge Instructions  General Discharge Instructions :  If you need to reach your doctor call: Monday-Friday 8:00 am - 4:00 pm at [phone] or toll free [phone].  After clinic hours [phone] to have operator reach doctor.  Bring all of your medication bottles to all your appointments in the pain clinic.  To cancel or reschedule your appointment with Pain Management please remember to call 24 hours in advance to avoid a fee.  Refer to the educational materials which you have been given on: General Risks, I had my Procedure. Discharge Instructions, Post Sedation.  Post Procedure Instructions:  The drugs you were given will stay in your system until tomorrow, so for the next 24 hours you should not drive, make any legal decisions or drink any alcoholic beverages.  You may eat anything you prefer, but it is better to start with liquids then soups and crackers, and gradually work up to solid foods.  Please notify your doctor immediately if you have any unusual bleeding, trouble breathing or pain that is  not related to your normal pain.  Depending on the type of procedure that was done, some parts of your body may feel week and/or numb.  This usually clears up by tonight or the next day.  Walk with the use of an assistive device or accompanied by an adult for the 24 hours.  You may use ice on the affected area for  the first 24 hours.  Put ice in a Ziploc bag and cover with a towel and place against area 15 minutes on 15 minutes off.  You may switch to heat after 24 hours.

## 2015-12-11 NOTE — Progress Notes (Signed)
Safety precautions to be maintained throughout the outpatient stay will include: orient to surroundings, keep bed in low position, maintain call bell within reach at all times, provide assistance with transfer out of bed and ambulation.  

## 2015-12-11 NOTE — Progress Notes (Signed)
Subjective:    Patient ID: Sharon Welch, female    DOB: 1970/10/01, 45 y.o.   MRN: [id number]  HPI  PROCEDURE PERFORMED: Lumbar facet (medial branch block)   NOTE: The patient is a 45 y.o. female who returns to [facility] for further evaluation and treatment of pain involving the lumbar and lower extremity region. 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 There is concern regarding significant component of patient's pain being due to facet syndrome The risks, benefits, and expectations of the procedure have been discussed and explained to the patient who was understanding and in agreement with suggested treatment plan. We will proceed with interventional treatment as discussed and as explained to the patient who was understanding and wished to proceed with procedure as planned.   DESCRIPTION OF PROCEDURE: Lumbar facet (medial branch block) with IV Versed, IV fentanyl conscious sedation, EKG, blood pressure, pulse, capnography, and pulse oximetry monitoring. The procedure was performed with the patient in the prone position. Betadine prep of proposed entry site performed.   NEEDLE PLACEMENT AT: Left L 2 lumbar facet (medial branch block). Under fluoroscopic guidance with oblique orientation of 15 degrees, a 22-gauge needle was inserted at the L 2 vertebral body level with needle placed at the targeted area of Burton's Eye or Eye of the Scotty Dog with documentation of needle placement in the superior and lateral border of targeted area of Burton's Eye or Eye of the Scotty Dog with oblique orientation of 15 degrees. Following documentation of needle placement at the L 2 vertebral body level,  needle placement was then accomplished at the L 3 vertebral body level.   NEEDLE PLACEMENT AT L3, L4, and L5 VERTEBRAL BODY LEVELS ON THE LEFT SIDE The procedure was performed at the L3, L4, and L5 vertebral body levels exactly as was performed at the L 2 vertebral body level utilizing the same technique and under fluoroscopic guidance.  NEEDLE PLACEMENT AT THE SACRAL ALA with AP view of the lumbosacral spine. With the patient in the prone position, Betadine prep of proposed entry site accomplished, a 22 gauge needle was inserted in the region of the sacral ala (groove formed by the superior articulating process of S1 and the sacral wing). Following documentation of needle placement at the sacral ala,      Needle placement was then verified at all levels on lateral view. Following documentation of needle placement at all levels on lateral view and following negative aspiration for heme and CSF, each level was injected with 1 mL of 0.25% bupivacaine with Kenalog.     LUMBAR FACET, MEDIAL BRANCH NERVE, BLOCKS PERFORMED ON THE RIGHT SIDE   The procedure was performed on the right side exactly as was performed on the left side at the same levels and utilizing the same technique under fluoroscopic guidance.   Myoneural block injections of the thoracic region Following Betadine prep of proposed entry site a 22-gauge needle was inserted into the thoracic musculature region and following negative aspiration 1 cc of 0.25% bupivacaine with Norflex was injected for myoneural block injection of the thoracic region 4    The patient tolerated the procedure well. A total of 40 mg of Kenalog was utilized for the procedure.  PLAN:  1. Medications: The patient will continue presently prescribed medication oxycodone and patient is to decrease diazepam as discussed. 2. May consider modification of treatment regimen at time of return appointment pending response to treatment rendered on today's  visit. 3. The patient is to follow-up with primary care physician for further evaluation of blood pressure and general medical condition status post steroid injection performed on today's visit. 4. Surgical follow-up evaluation. 5. Neurological follow-up evaluation. The patient will follow-up with Dr.Baute in this regard and we'll also discuss patient's use of diazepam  6. The patient may be candidate for radiofrequency procedures, implantation type procedures, and other treatment pending response to treatment and follow-up evaluation. 7. The patient has been advised to call the Pain Management Center prior to scheduled return appointment should there be significant change in condition or should patient have other concerns regarding condition prior to scheduled return appointment.  The patient is understanding and in agreement with suggested treatment plan.   Review of Systems     Objective:   Physical Exam        Assessment & Plan:

## 2015-12-12 ENCOUNTER — Telehealth: Payer: Self-pay | Admitting: *Deleted

## 2015-12-12 NOTE — Telephone Encounter (Signed)
No problems post procedure. 

## 2015-12-27 ENCOUNTER — Ambulatory Visit: Payer: Medicare Other | Admitting: Pain Medicine

## 2016-01-02 ENCOUNTER — Encounter: Payer: Self-pay | Admitting: Pain Medicine

## 2016-01-02 ENCOUNTER — Ambulatory Visit: Payer: Medicare Other | Attending: Pain Medicine | Admitting: Pain Medicine

## 2016-01-02 VITALS — BP 148/74 | HR 121 | Temp 98.3°F | Resp 18 | Ht 61.0 in | Wt 95.0 lb

## 2016-01-02 DIAGNOSIS — G43909 Migraine, unspecified, not intractable, without status migrainosus: Secondary | ICD-10-CM | POA: Diagnosis not present

## 2016-01-02 DIAGNOSIS — M160 Bilateral primary osteoarthritis of hip: Secondary | ICD-10-CM

## 2016-01-02 DIAGNOSIS — M5134 Other intervertebral disc degeneration, thoracic region: Secondary | ICD-10-CM | POA: Diagnosis not present

## 2016-01-02 DIAGNOSIS — M62830 Muscle spasm of back: Secondary | ICD-10-CM | POA: Insufficient documentation

## 2016-01-02 DIAGNOSIS — M5137 Other intervertebral disc degeneration, lumbosacral region: Secondary | ICD-10-CM

## 2016-01-02 DIAGNOSIS — M791 Myalgia: Secondary | ICD-10-CM | POA: Diagnosis not present

## 2016-01-02 DIAGNOSIS — Z9889 Other specified postprocedural states: Secondary | ICD-10-CM | POA: Diagnosis not present

## 2016-01-02 DIAGNOSIS — M4186 Other forms of scoliosis, lumbar region: Secondary | ICD-10-CM | POA: Diagnosis not present

## 2016-01-02 DIAGNOSIS — M5030 Other cervical disc degeneration, unspecified cervical region: Secondary | ICD-10-CM | POA: Insufficient documentation

## 2016-01-02 DIAGNOSIS — G588 Other specified mononeuropathies: Secondary | ICD-10-CM

## 2016-01-02 DIAGNOSIS — S46812D Strain of other muscles, fascia and tendons at shoulder and upper arm level, left arm, subsequent encounter: Secondary | ICD-10-CM

## 2016-01-02 DIAGNOSIS — M5416 Radiculopathy, lumbar region: Secondary | ICD-10-CM | POA: Diagnosis not present

## 2016-01-02 DIAGNOSIS — G43109 Migraine with aura, not intractable, without status migrainosus: Secondary | ICD-10-CM

## 2016-01-02 DIAGNOSIS — M542 Cervicalgia: Secondary | ICD-10-CM | POA: Diagnosis present

## 2016-01-02 DIAGNOSIS — M5136 Other intervertebral disc degeneration, lumbar region: Secondary | ICD-10-CM | POA: Insufficient documentation

## 2016-01-02 DIAGNOSIS — M4184 Other forms of scoliosis, thoracic region: Secondary | ICD-10-CM | POA: Insufficient documentation

## 2016-01-02 DIAGNOSIS — R51 Headache: Secondary | ICD-10-CM | POA: Diagnosis present

## 2016-01-02 DIAGNOSIS — S4382XD Sprain of other specified parts of left shoulder girdle, subsequent encounter: Secondary | ICD-10-CM

## 2016-01-02 DIAGNOSIS — M5481 Occipital neuralgia: Secondary | ICD-10-CM | POA: Insufficient documentation

## 2016-01-02 DIAGNOSIS — M47817 Spondylosis without myelopathy or radiculopathy, lumbosacral region: Secondary | ICD-10-CM | POA: Diagnosis not present

## 2016-01-02 DIAGNOSIS — M461 Sacroiliitis, not elsewhere classified: Secondary | ICD-10-CM | POA: Diagnosis not present

## 2016-01-02 DIAGNOSIS — M546 Pain in thoracic spine: Secondary | ICD-10-CM | POA: Diagnosis present

## 2016-01-02 MED ORDER — DIAZEPAM 5 MG PO TABS: ORAL_TABLET | ORAL | Status: DC

## 2016-01-02 MED ORDER — OXYCODONE HCL 5 MG PO TABS: ORAL_TABLET | ORAL | Status: DC

## 2016-01-02 NOTE — Patient Instructions (Signed)
PLAN   Continue present medication  diazepam and oxycodone. Please discuss diazepam with your primary care physician and with your neurologist Dr. Larose Kells. We need to consider decreasing and discontinuing diazepam provided your physicians agrees. Please discuss decreasing and discontinuing Valium with your neurologist and primary care physician as we discussed  F/U PCP for evaliation of BP and general medical condition as discussed  F/U surgical evaluation as planned and Synvisc injections of knee as previously discussed  F/U neurological evaluation as planned with Dr.Baute as we discussed. Please discuss decreasing and discontinuing diazepam with Dr.Baute    Psych eval as discussed .  May consider radiofrequency rhizolysis or intraspinal procedures pending response to present treatment and F/U evaluation   Patient to call [facility] should patient have concerns prior to scheduled return appointment.

## 2016-01-02 NOTE — Progress Notes (Signed)
   Subjective:    Patient ID: Sharon Welch, female    DOB: 12-06-1970, 45 y.o.   MRN: [id number]  HPI  The patient is a 45 year old female who returns to pain management for further evaluation and treatment of pain involving the neck associated with headaches as well as pain of the entire back upper and lower extremity regions. The patient is with prior surgical intervention of the thoracic oh lumbar region and is with significant muscle spasms involving the thoracic and lumbar regions. At the present time patient continues medications consisting of oxycodone and Valium. We discussed patient's need to consider decreasing and discontinuing Valium depending upon the decision of her neurologist Dr.Baute at the present time we will continue medications as prescribed and we will remain available to consider additional modifications of treatment regimen including interventional treatment pending follow-up evaluation. All agreed to suggested treatment plan.   Review of Systems     Objective:   Physical Exam   There was tenderness to palpation of the paraspinal musculature region of the cervical region cervical facet region of severe degree with severe tenderness of the splenius capitis and occipitalis region. There was tenderness of the acromioclavicular and glenohumeral joint region a mild degree. The patient was able to perform drop test without significant difficulty. Tinel and Phalen's maneuver were without increased pain of significant degree. The patient appeared to be with bilaterally equal grip strength. There was moderate muscle spasm of the thoracic region with no crepitus of the thoracic region noted. There was limited range of motion of the thoracic oh lumbar spine. Lateral bending rotation extension and palpation over the lumbar facets reproduce moderate discomfort. Straight leg raise was tolerates approximately 30 without increased pain with dorsiflexion noted. DTRs appeared to be trace at the  knees and there was negative clonus negative Homans. No increased warmth and erythema in the region of the knees were noted. Was tennis to palpation of the PSIS and PII S region a moderate degree with moderate tenderness of the greater trochanteric region iliotibial band region. 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 Please discuss diazepam with your primary care physician and with your neurologist Dr. Larose Kells. We need to consider decreasing and discontinuing diazepam provided your physicians agrees. Please discuss decreasing and discontinuing Valium with your neurologist and primary care physician as we discussed  F/U PCP for evaliation of BP and general medical condition as discussed  F/U surgical evaluation as planned and Synvisc injections of knee as previously discussed  F/U neurological evaluation as planned with Dr.Baute as we discussed. Please discuss decreasing and discontinuing diazepam with Dr.Baute    Psych eval as discussed .  May consider radiofrequency rhizolysis or intraspinal procedures pending response to present treatment and F/U evaluation   Patient to call [facility] should patient have concerns prior to scheduled return appointment.

## 2016-01-02 NOTE — Progress Notes (Signed)
Safety precautions to be maintained throughout the outpatient stay will include: orient to surroundings, keep bed in low position, maintain call bell within reach at all times, provide assistance with transfer out of bed and ambulation.  

## 2016-01-08 DIAGNOSIS — M170 Bilateral primary osteoarthritis of knee: Secondary | ICD-10-CM | POA: Diagnosis not present

## 2016-01-15 DIAGNOSIS — M170 Bilateral primary osteoarthritis of knee: Secondary | ICD-10-CM | POA: Diagnosis not present

## 2016-01-29 ENCOUNTER — Ambulatory Visit: Payer: Medicare Other | Attending: Pain Medicine | Admitting: Pain Medicine

## 2016-01-29 ENCOUNTER — Encounter: Payer: Self-pay | Admitting: Pain Medicine

## 2016-01-29 VITALS — BP 106/83 | HR 89 | Temp 97.7°F | Resp 16 | Ht 61.0 in | Wt 94.0 lb

## 2016-01-29 DIAGNOSIS — M461 Sacroiliitis, not elsewhere classified: Secondary | ICD-10-CM | POA: Diagnosis not present

## 2016-01-29 DIAGNOSIS — M5137 Other intervertebral disc degeneration, lumbosacral region: Secondary | ICD-10-CM

## 2016-01-29 DIAGNOSIS — M5030 Other cervical disc degeneration, unspecified cervical region: Secondary | ICD-10-CM | POA: Diagnosis not present

## 2016-01-29 DIAGNOSIS — G43909 Migraine, unspecified, not intractable, without status migrainosus: Secondary | ICD-10-CM | POA: Insufficient documentation

## 2016-01-29 DIAGNOSIS — M542 Cervicalgia: Secondary | ICD-10-CM | POA: Diagnosis present

## 2016-01-29 DIAGNOSIS — M47817 Spondylosis without myelopathy or radiculopathy, lumbosacral region: Secondary | ICD-10-CM | POA: Diagnosis not present

## 2016-01-29 DIAGNOSIS — G43001 Migraine without aura, not intractable, with status migrainosus: Secondary | ICD-10-CM

## 2016-01-29 DIAGNOSIS — M5134 Other intervertebral disc degeneration, thoracic region: Secondary | ICD-10-CM | POA: Diagnosis not present

## 2016-01-29 DIAGNOSIS — M546 Pain in thoracic spine: Secondary | ICD-10-CM | POA: Diagnosis present

## 2016-01-29 DIAGNOSIS — M51379 Other intervertebral disc degeneration, lumbosacral region without mention of lumbar back pain or lower extremity pain: Secondary | ICD-10-CM

## 2016-01-29 DIAGNOSIS — Z9889 Other specified postprocedural states: Secondary | ICD-10-CM | POA: Insufficient documentation

## 2016-01-29 DIAGNOSIS — M5481 Occipital neuralgia: Secondary | ICD-10-CM | POA: Insufficient documentation

## 2016-01-29 DIAGNOSIS — M5136 Other intervertebral disc degeneration, lumbar region: Secondary | ICD-10-CM | POA: Diagnosis not present

## 2016-01-29 DIAGNOSIS — M791 Myalgia: Secondary | ICD-10-CM | POA: Diagnosis not present

## 2016-01-29 DIAGNOSIS — M5416 Radiculopathy, lumbar region: Secondary | ICD-10-CM | POA: Diagnosis not present

## 2016-01-29 DIAGNOSIS — G43009 Migraine without aura, not intractable, without status migrainosus: Secondary | ICD-10-CM

## 2016-01-29 DIAGNOSIS — G588 Other specified mononeuropathies: Secondary | ICD-10-CM

## 2016-01-29 DIAGNOSIS — M79606 Pain in leg, unspecified: Secondary | ICD-10-CM | POA: Diagnosis present

## 2016-01-29 DIAGNOSIS — M4185 Other forms of scoliosis, thoracolumbar region: Secondary | ICD-10-CM | POA: Diagnosis not present

## 2016-01-29 DIAGNOSIS — M62830 Muscle spasm of back: Secondary | ICD-10-CM | POA: Insufficient documentation

## 2016-01-29 MED ORDER — DIAZEPAM 5 MG PO TABS: ORAL_TABLET | ORAL | 0 refills | Status: DC

## 2016-01-29 MED ORDER — OXYCODONE HCL 5 MG PO TABS: ORAL_TABLET | ORAL | 0 refills | Status: DC

## 2016-01-29 NOTE — Patient Instructions (Signed)
PLAN   Continue present medication  diazepam and oxycodone.  Please discuss diazepam with your primary care physician and with your neurologist Dr. Larose Kells. We need to consider decreasing and discontinuing diazepam provided your physicians agrees. Please discuss decreasing and discontinuing Valium with your neurologist and primary care physician as we discussed  F/U PCP for evaliation of BP and general medical condition as discussed  F/U surgical evaluation as planned and Synvisc injections of knee as previously discussed  F/U neurological evaluation as planned with Dr.Baute as we discussed. Please discuss decreasing and discontinuing diazepam with Dr.Baute    Psych eval as discussed .  May consider radiofrequency rhizolysis or intraspinal procedures pending response to present treatment and F/U evaluation   Patient to call [facility] should patient have concerns prior to scheduled return appointment.

## 2016-01-29 NOTE — Progress Notes (Signed)
Patient here for medication management Safety precautions to be maintained throughout the outpatient stay will include: orient to surroundings, keep bed in low position, maintain call bell within reach at all times, provide assistance with transfer out of bed and ambulation.  

## 2016-01-29 NOTE — Progress Notes (Signed)
    The patient is a 45 year old female who returns to pain management for further evaluation and treatment of pain involving the neck entire back upper and lower extremity regions. The patient is with severe spasms involving the mid back lower back region. The patient continues medications with pain interfering with activities of daily living to moderate degree. Patient has had improvement with interventional treatment performed in pain management. We discussed patient's condition and patient will undergo follow-up neurological and neurosurgical evaluations and we will continue presently prescribed medications consisting of oxycodone and have encouraged patient to decrease the use of Valium provided patient's neurologist is in agreement with such. All agreed to suggested treatment plan.      Physical examination  The patient was with moderate tenderness of the splenius capitis and occipitalis region as well as the cervical facet and thoracic facet regions there was severe tenderness to palpation of the mid and lower thoracic paraspinal musculature regions. The patient was with tenderness of the acromioclavicular and glenohumeral joint region and appeared to be with decreased grip strength with Tinel and Phalen's maneuver reproducing pain of mild degree. The patient appeared to be unremarkable Spurling's maneuver. There were well-healed surgical scars of the thoracic lumbar region without increased warmth and erythema in the region scar is with severe muscle spasm of the lower thoracic region as well as the lumbar paraspinal musculature regions palpation over the lumbar facets reproduced moderate to moderately severe degree of pain with tenderness of the PSIS and PII S regions a moderate degree. Straight leg raising was tolerates approximately 20 with questionable decreased sensation of the L5 dermatomal distribution detected. There was negative clonus negative Homans. Palpation of the greater  trochanteric region iliotibial band region reproduced moderate discomfort. There was no abdominal tends to palpation and there was no costovertebral angle tenderness       Assessment      Migraine headaches  Bilateral occipital neuralgia  Degenerative disc disease thoracic spine Thoracic facet syndrome Status post Harrington rods thoracic and lumbar regions for severe scoliosis of the thoracolumbar spine  Degenerative disc disease lumbar spine  Lumbar facet syndrome  Status post Harrington rods for severe scoliosis of the thoracic and lumbar regions  Degenerative disc disease cervical spine  Cervical facet syndrome      PLAN   Continue present medication  diazepam and oxycodone.  Please discuss diazepam with your primary care physician and with your neurologist Dr. Larose Kells. We need to consider decreasing and discontinuing diazepam provided your physicians agrees. Please discuss decreasing and discontinuing Valium with your neurologist and primary care physician as we discussed  F/U PCP for evaliation of BP and general medical condition as discussed  F/U surgical evaluation as planned and Synvisc injections of knee as previously discussed  F/U neurological evaluation as planned with Dr.Baute as we discussed. Please discuss decreasing and discontinuing diazepam with Dr.Baute    Psych eval as discussed .  May consider radiofrequency rhizolysis or intraspinal procedures pending response to present treatment and F/U evaluation   Patient to call [facility] should patient have concerns prior to scheduled return appointment.

## 2016-02-20 ENCOUNTER — Telehealth: Payer: Self-pay | Admitting: *Deleted

## 2016-02-21 ENCOUNTER — Ambulatory Visit: Payer: Medicare Other | Attending: Pain Medicine | Admitting: Pain Medicine

## 2016-02-21 ENCOUNTER — Encounter: Payer: Self-pay | Admitting: Pain Medicine

## 2016-02-21 VITALS — BP 112/74 | HR 86 | Temp 97.7°F | Resp 14 | Ht 61.0 in | Wt 104.0 lb

## 2016-02-21 DIAGNOSIS — M62830 Muscle spasm of back: Secondary | ICD-10-CM | POA: Diagnosis not present

## 2016-02-21 DIAGNOSIS — M5481 Occipital neuralgia: Secondary | ICD-10-CM | POA: Diagnosis not present

## 2016-02-21 DIAGNOSIS — M5134 Other intervertebral disc degeneration, thoracic region: Secondary | ICD-10-CM | POA: Insufficient documentation

## 2016-02-21 DIAGNOSIS — G43009 Migraine without aura, not intractable, without status migrainosus: Secondary | ICD-10-CM

## 2016-02-21 DIAGNOSIS — M4185 Other forms of scoliosis, thoracolumbar region: Secondary | ICD-10-CM | POA: Diagnosis not present

## 2016-02-21 DIAGNOSIS — M5030 Other cervical disc degeneration, unspecified cervical region: Secondary | ICD-10-CM | POA: Diagnosis not present

## 2016-02-21 DIAGNOSIS — Z9889 Other specified postprocedural states: Secondary | ICD-10-CM | POA: Insufficient documentation

## 2016-02-21 DIAGNOSIS — M546 Pain in thoracic spine: Secondary | ICD-10-CM | POA: Diagnosis present

## 2016-02-21 DIAGNOSIS — M5136 Other intervertebral disc degeneration, lumbar region: Secondary | ICD-10-CM | POA: Insufficient documentation

## 2016-02-21 DIAGNOSIS — M160 Bilateral primary osteoarthritis of hip: Secondary | ICD-10-CM

## 2016-02-21 DIAGNOSIS — M5416 Radiculopathy, lumbar region: Secondary | ICD-10-CM | POA: Diagnosis not present

## 2016-02-21 DIAGNOSIS — G588 Other specified mononeuropathies: Secondary | ICD-10-CM

## 2016-02-21 DIAGNOSIS — M461 Sacroiliitis, not elsewhere classified: Secondary | ICD-10-CM | POA: Diagnosis not present

## 2016-02-21 DIAGNOSIS — M47817 Spondylosis without myelopathy or radiculopathy, lumbosacral region: Secondary | ICD-10-CM | POA: Diagnosis not present

## 2016-02-21 DIAGNOSIS — G43909 Migraine, unspecified, not intractable, without status migrainosus: Secondary | ICD-10-CM | POA: Insufficient documentation

## 2016-02-21 DIAGNOSIS — M542 Cervicalgia: Secondary | ICD-10-CM | POA: Diagnosis present

## 2016-02-21 DIAGNOSIS — M791 Myalgia: Secondary | ICD-10-CM | POA: Diagnosis not present

## 2016-02-21 DIAGNOSIS — M5137 Other intervertebral disc degeneration, lumbosacral region: Secondary | ICD-10-CM

## 2016-02-21 DIAGNOSIS — M79606 Pain in leg, unspecified: Secondary | ICD-10-CM | POA: Diagnosis present

## 2016-02-21 MED ORDER — DIAZEPAM 5 MG PO TABS: ORAL_TABLET | ORAL | 0 refills | Status: DC

## 2016-02-21 MED ORDER — OXYCODONE HCL 5 MG PO TABS: ORAL_TABLET | ORAL | 0 refills | Status: DC

## 2016-02-21 NOTE — Patient Instructions (Addendum)
PLAN   Continue present medication  diazepam and oxycodone.  Please discuss diazepam with your primary care physician and with your neurologist Dr. Larose Kells. We need to consider decreasing and discontinuing diazepam provided your physicians agrees. Please discuss decreasing and discontinuing Valium with your neurologist and primary care physician as we discussed  Please try to decrease and discontinuing smoking as we discussed today  F/U PCP for evaluation of BP and general medical condition as discussed  F/U surgical evaluation as planned and Synvisc injections of knee as previously discussed  F/U neurological evaluation as planned with Dr.Baute as we discussed. Please discuss decreasing and discontinuing diazepam with Dr.Baute    Psych eval as discussed .  May consider radiofrequency rhizolysis or intraspinal procedures pending response to present treatment and F/U evaluation   Patient to call [facility] should patient have concerns prior to scheduled return appointment.

## 2016-02-21 NOTE — Progress Notes (Signed)
    The patient is a 45 year old female who returns to pain management for further evaluation and treatment of pain involving the region of the neck entire back upper and lower extremity regions. The patient states that the pain is fairly well-controlled at this time. The patient is with prior surgical intervention of the thoracic oh lumbar region and is without plans for additional surgical intervention at this time. The patient's pain was associated with spasms occurring in the cervical thoracic and lumbar regions as well as with headaches that we component of migraine headache as well as bilateral occipital neuralgia and cervicogenic headaches. The patient stated that the pain is aggravated by prolonged standing and walking and interferes with ability to obtain restful sleep at times. We will continue present treatment regimen and we'll consider additional treatment modifications including interventional treatment should they be significant change in patient's condition and response to the noninterventional treatment measures. The patient was with understanding and agreed to suggested treatment plan.    Physical examination  There was moderate tenderness of the splenius capitis and occipitalis region. Palpation over the cervical facet cervical paraspinal musculature and thoracic facet thoracic paraspinal musculature regions reproduced pain of moderate degree. There appeared to be unremarkable Spurling's maneuver. Palpation of the acromioclavicular and glenohumeral joint regions reproduce moderate discomfort with limited range of motion of the shoulder noted. The patient was with slightly decreased grip strength without significant increase of pain with Tinel and Phalen's maneuver noted. Palpation over the region of the lumbar region was with tenderness to palpation with lateral bending rotation extension and palpation over the lumbar facets reproducing moderate discomfort. Outpatient over the thoracic  region was attends to palpation with palpation over the thoracic region associated with severe muscle spasm with no crepitus of the thoracic region noted. The patient was with tenderness to palpation over the region of the PSIS and PII S region a moderate . And was with mild to moderate tenderness of the greater trochanteric region iliotibial band region. Straight leg raise was tolerates approximately 30 without increased pain with dorsiflexion noted. There was no definite sensory deficit or dermatomal dystrophy detected. There was negative clonus negative Homans. Abdomen was nontender with no costovertebral angle tenderness noted.     Assessment   Migraine headaches  Bilateral occipital neuralgia  Degenerative disc disease of the cervical spine  Cervical facet syndrome  Degenerative disc disease thoracic spine  Thoracic facet syndrome  Status post Harrington rods thoracic and lumbar regions for severe scoliosis of the thoracolumbar spine  Degenerative disc disease lumbar spine  Lumbar facet syndrome      PLAN   Continue present medications  diazepam and oxycodone.  Please discuss diazepam with your primary care physician and with your neurologist Dr. Larose Kells. We need to consider decreasing and discontinuing diazepam provided your physicians agrees. Please discuss decreasing and discontinuing Valium with your neurologist and primary care physician as we discussed  Please try to decrease and discontinue smoking as we discussed today  F/U PCP for evaluation of BP and general medical condition as discussed  F/U surgical evaluation as planned and Synvisc injections of knee as previously discussed  F/U neurological evaluation as planned with Dr.Baute as we discussed. Please discuss decreasing and discontinuing diazepam with Dr.Baute    Psych eval as discussed .  May consider radiofrequency rhizolysis or intraspinal procedures pending response to present treatment and F/U  evaluation   Patient to call [facility] should patient have concerns prior to scheduled return appointment.

## 2016-02-27 ENCOUNTER — Encounter: Payer: Medicare Other | Admitting: Pain Medicine

## 2016-03-05 ENCOUNTER — Telehealth: Payer: Self-pay | Admitting: *Deleted

## 2016-03-28 DIAGNOSIS — J370 Chronic laryngitis: Secondary | ICD-10-CM | POA: Diagnosis not present

## 2016-03-28 DIAGNOSIS — H6063 Unspecified chronic otitis externa, bilateral: Secondary | ICD-10-CM | POA: Diagnosis not present

## 2016-03-28 DIAGNOSIS — J322 Chronic ethmoidal sinusitis: Secondary | ICD-10-CM | POA: Diagnosis not present

## 2016-03-28 DIAGNOSIS — J320 Chronic maxillary sinusitis: Secondary | ICD-10-CM | POA: Diagnosis not present

## 2016-03-28 DIAGNOSIS — J039 Acute tonsillitis, unspecified: Secondary | ICD-10-CM | POA: Diagnosis not present

## 2016-04-22 ENCOUNTER — Ambulatory Visit: Payer: Medicare Other | Admitting: Pain Medicine

## 2016-04-24 ENCOUNTER — Other Ambulatory Visit: Payer: Self-pay | Admitting: Pain Medicine

## 2016-05-27 DIAGNOSIS — M47817 Spondylosis without myelopathy or radiculopathy, lumbosacral region: Secondary | ICD-10-CM | POA: Diagnosis not present

## 2016-05-27 DIAGNOSIS — M461 Sacroiliitis, not elsewhere classified: Secondary | ICD-10-CM | POA: Diagnosis not present

## 2016-05-27 DIAGNOSIS — M791 Myalgia: Secondary | ICD-10-CM | POA: Diagnosis not present

## 2016-05-27 DIAGNOSIS — M5416 Radiculopathy, lumbar region: Secondary | ICD-10-CM | POA: Diagnosis not present

## 2016-06-25 DIAGNOSIS — M461 Sacroiliitis, not elsewhere classified: Secondary | ICD-10-CM | POA: Diagnosis not present

## 2016-06-25 DIAGNOSIS — M5416 Radiculopathy, lumbar region: Secondary | ICD-10-CM | POA: Diagnosis not present

## 2016-06-25 DIAGNOSIS — M47817 Spondylosis without myelopathy or radiculopathy, lumbosacral region: Secondary | ICD-10-CM | POA: Diagnosis not present

## 2016-06-25 DIAGNOSIS — M791 Myalgia: Secondary | ICD-10-CM | POA: Diagnosis not present

## 2016-07-22 DIAGNOSIS — M5416 Radiculopathy, lumbar region: Secondary | ICD-10-CM | POA: Diagnosis not present

## 2016-07-22 DIAGNOSIS — M461 Sacroiliitis, not elsewhere classified: Secondary | ICD-10-CM | POA: Diagnosis not present

## 2016-07-22 DIAGNOSIS — M791 Myalgia: Secondary | ICD-10-CM | POA: Diagnosis not present

## 2016-07-22 DIAGNOSIS — M47817 Spondylosis without myelopathy or radiculopathy, lumbosacral region: Secondary | ICD-10-CM | POA: Diagnosis not present

## 2016-08-07 DIAGNOSIS — M797 Fibromyalgia: Secondary | ICD-10-CM | POA: Diagnosis not present

## 2016-08-07 DIAGNOSIS — R110 Nausea: Secondary | ICD-10-CM | POA: Diagnosis not present

## 2016-08-07 DIAGNOSIS — M5481 Occipital neuralgia: Secondary | ICD-10-CM | POA: Diagnosis not present

## 2016-08-07 DIAGNOSIS — M546 Pain in thoracic spine: Secondary | ICD-10-CM | POA: Diagnosis not present

## 2016-08-07 DIAGNOSIS — Z79899 Other long term (current) drug therapy: Secondary | ICD-10-CM | POA: Diagnosis not present

## 2016-08-07 DIAGNOSIS — G8929 Other chronic pain: Secondary | ICD-10-CM | POA: Diagnosis not present

## 2016-08-07 DIAGNOSIS — G43709 Chronic migraine without aura, not intractable, without status migrainosus: Secondary | ICD-10-CM | POA: Diagnosis not present

## 2016-08-07 DIAGNOSIS — Q8501 Neurofibromatosis, type 1: Secondary | ICD-10-CM | POA: Diagnosis not present

## 2016-08-20 DIAGNOSIS — M791 Myalgia: Secondary | ICD-10-CM | POA: Diagnosis not present

## 2016-08-20 DIAGNOSIS — M461 Sacroiliitis, not elsewhere classified: Secondary | ICD-10-CM | POA: Diagnosis not present

## 2016-08-20 DIAGNOSIS — M47817 Spondylosis without myelopathy or radiculopathy, lumbosacral region: Secondary | ICD-10-CM | POA: Diagnosis not present

## 2016-08-20 DIAGNOSIS — M5416 Radiculopathy, lumbar region: Secondary | ICD-10-CM | POA: Diagnosis not present

## 2016-09-16 DIAGNOSIS — M5135 Other intervertebral disc degeneration, thoracolumbar region: Secondary | ICD-10-CM | POA: Diagnosis not present

## 2016-09-16 DIAGNOSIS — M5033 Other cervical disc degeneration, cervicothoracic region: Secondary | ICD-10-CM | POA: Diagnosis not present

## 2016-09-16 DIAGNOSIS — M47817 Spondylosis without myelopathy or radiculopathy, lumbosacral region: Secondary | ICD-10-CM | POA: Diagnosis not present

## 2016-09-16 DIAGNOSIS — M546 Pain in thoracic spine: Secondary | ICD-10-CM | POA: Diagnosis not present

## 2016-09-16 DIAGNOSIS — M5416 Radiculopathy, lumbar region: Secondary | ICD-10-CM | POA: Diagnosis not present

## 2016-09-16 DIAGNOSIS — G894 Chronic pain syndrome: Secondary | ICD-10-CM | POA: Diagnosis not present

## 2016-09-16 DIAGNOSIS — Z79891 Long term (current) use of opiate analgesic: Secondary | ICD-10-CM | POA: Diagnosis not present

## 2016-09-16 DIAGNOSIS — M5031 Other cervical disc degeneration,  high cervical region: Secondary | ICD-10-CM | POA: Diagnosis not present

## 2016-09-16 DIAGNOSIS — M461 Sacroiliitis, not elsewhere classified: Secondary | ICD-10-CM | POA: Diagnosis not present

## 2016-09-16 DIAGNOSIS — M791 Myalgia: Secondary | ICD-10-CM | POA: Diagnosis not present

## 2016-09-16 DIAGNOSIS — M542 Cervicalgia: Secondary | ICD-10-CM | POA: Diagnosis not present

## 2016-09-16 DIAGNOSIS — M5134 Other intervertebral disc degeneration, thoracic region: Secondary | ICD-10-CM | POA: Diagnosis not present

## 2016-10-14 DIAGNOSIS — M5031 Other cervical disc degeneration,  high cervical region: Secondary | ICD-10-CM | POA: Diagnosis not present

## 2016-10-14 DIAGNOSIS — M461 Sacroiliitis, not elsewhere classified: Secondary | ICD-10-CM | POA: Diagnosis not present

## 2016-10-14 DIAGNOSIS — M47817 Spondylosis without myelopathy or radiculopathy, lumbosacral region: Secondary | ICD-10-CM | POA: Diagnosis not present

## 2016-10-14 DIAGNOSIS — M5135 Other intervertebral disc degeneration, thoracolumbar region: Secondary | ICD-10-CM | POA: Diagnosis not present

## 2016-10-14 DIAGNOSIS — M791 Myalgia: Secondary | ICD-10-CM | POA: Diagnosis not present

## 2016-10-14 DIAGNOSIS — Z79891 Long term (current) use of opiate analgesic: Secondary | ICD-10-CM | POA: Diagnosis not present

## 2016-10-14 DIAGNOSIS — M5134 Other intervertebral disc degeneration, thoracic region: Secondary | ICD-10-CM | POA: Diagnosis not present

## 2016-10-14 DIAGNOSIS — M5033 Other cervical disc degeneration, cervicothoracic region: Secondary | ICD-10-CM | POA: Diagnosis not present

## 2016-10-14 DIAGNOSIS — M542 Cervicalgia: Secondary | ICD-10-CM | POA: Diagnosis not present

## 2016-10-14 DIAGNOSIS — M546 Pain in thoracic spine: Secondary | ICD-10-CM | POA: Diagnosis not present

## 2016-10-14 DIAGNOSIS — G894 Chronic pain syndrome: Secondary | ICD-10-CM | POA: Diagnosis not present

## 2016-10-14 DIAGNOSIS — M5416 Radiculopathy, lumbar region: Secondary | ICD-10-CM | POA: Diagnosis not present

## 2016-10-30 DIAGNOSIS — R300 Dysuria: Secondary | ICD-10-CM | POA: Diagnosis not present

## 2016-11-11 DIAGNOSIS — M5416 Radiculopathy, lumbar region: Secondary | ICD-10-CM | POA: Diagnosis not present

## 2016-11-11 DIAGNOSIS — Z79891 Long term (current) use of opiate analgesic: Secondary | ICD-10-CM | POA: Diagnosis not present

## 2016-11-11 DIAGNOSIS — M5137 Other intervertebral disc degeneration, lumbosacral region: Secondary | ICD-10-CM | POA: Diagnosis not present

## 2016-11-11 DIAGNOSIS — M5481 Occipital neuralgia: Secondary | ICD-10-CM | POA: Diagnosis not present

## 2016-11-11 DIAGNOSIS — M47816 Spondylosis without myelopathy or radiculopathy, lumbar region: Secondary | ICD-10-CM | POA: Diagnosis not present

## 2016-11-11 DIAGNOSIS — M47817 Spondylosis without myelopathy or radiculopathy, lumbosacral region: Secondary | ICD-10-CM | POA: Diagnosis not present

## 2016-11-11 DIAGNOSIS — M791 Myalgia: Secondary | ICD-10-CM | POA: Diagnosis not present

## 2016-11-11 DIAGNOSIS — M5031 Other cervical disc degeneration,  high cervical region: Secondary | ICD-10-CM | POA: Diagnosis not present

## 2016-11-11 DIAGNOSIS — M5033 Other cervical disc degeneration, cervicothoracic region: Secondary | ICD-10-CM | POA: Diagnosis not present

## 2016-11-11 DIAGNOSIS — M461 Sacroiliitis, not elsewhere classified: Secondary | ICD-10-CM | POA: Diagnosis not present

## 2016-11-11 DIAGNOSIS — M542 Cervicalgia: Secondary | ICD-10-CM | POA: Diagnosis not present

## 2016-11-11 DIAGNOSIS — M5134 Other intervertebral disc degeneration, thoracic region: Secondary | ICD-10-CM | POA: Diagnosis not present

## 2016-12-07 ENCOUNTER — Encounter: Payer: Self-pay | Admitting: Emergency Medicine

## 2016-12-07 ENCOUNTER — Emergency Department
Admission: EM | Admit: 2016-12-07 | Discharge: 2016-12-07 | Disposition: A | Discharge status: Home / Self Care | Payer: Medicare Other | Attending: Student in an Organized Health Care Education/Training Program | Admitting: Student in an Organized Health Care Education/Training Program

## 2016-12-07 ENCOUNTER — Emergency Department: Payer: Medicare Other

## 2016-12-07 DIAGNOSIS — Y9301 Activity, walking, marching and hiking: Secondary | ICD-10-CM | POA: Diagnosis not present

## 2016-12-07 DIAGNOSIS — G8929 Other chronic pain: Secondary | ICD-10-CM | POA: Diagnosis not present

## 2016-12-07 DIAGNOSIS — S3992XA Unspecified injury of lower back, initial encounter: Secondary | ICD-10-CM | POA: Diagnosis not present

## 2016-12-07 DIAGNOSIS — M25562 Pain in left knee: Secondary | ICD-10-CM | POA: Diagnosis not present

## 2016-12-07 DIAGNOSIS — Z79899 Other long term (current) drug therapy: Secondary | ICD-10-CM | POA: Diagnosis not present

## 2016-12-07 DIAGNOSIS — Z79891 Long term (current) use of opiate analgesic: Secondary | ICD-10-CM | POA: Insufficient documentation

## 2016-12-07 DIAGNOSIS — Y929 Unspecified place or not applicable: Secondary | ICD-10-CM | POA: Insufficient documentation

## 2016-12-07 DIAGNOSIS — S79912A Unspecified injury of left hip, initial encounter: Secondary | ICD-10-CM | POA: Diagnosis not present

## 2016-12-07 DIAGNOSIS — M791 Myalgia: Secondary | ICD-10-CM | POA: Diagnosis not present

## 2016-12-07 DIAGNOSIS — M7918 Myalgia, other site: Secondary | ICD-10-CM

## 2016-12-07 DIAGNOSIS — S161XXA Strain of muscle, fascia and tendon at neck level, initial encounter: Secondary | ICD-10-CM | POA: Insufficient documentation

## 2016-12-07 DIAGNOSIS — W108XXA Fall (on) (from) other stairs and steps, initial encounter: Secondary | ICD-10-CM | POA: Insufficient documentation

## 2016-12-07 DIAGNOSIS — Y999 Unspecified external cause status: Secondary | ICD-10-CM | POA: Insufficient documentation

## 2016-12-07 DIAGNOSIS — M25552 Pain in left hip: Secondary | ICD-10-CM | POA: Diagnosis not present

## 2016-12-07 DIAGNOSIS — S99912A Unspecified injury of left ankle, initial encounter: Secondary | ICD-10-CM | POA: Diagnosis not present

## 2016-12-07 DIAGNOSIS — F17210 Nicotine dependence, cigarettes, uncomplicated: Secondary | ICD-10-CM | POA: Insufficient documentation

## 2016-12-07 DIAGNOSIS — S199XXA Unspecified injury of neck, initial encounter: Secondary | ICD-10-CM | POA: Diagnosis present

## 2016-12-07 MED ORDER — IBUPROFEN 600 MG PO TABS
600.0000 mg | ORAL_TABLET | Freq: Once | ORAL | Status: AC
  Administered 2016-12-07: 600 mg via ORAL
  Filled 2016-12-07: qty 1

## 2016-12-07 MED ORDER — OXYCODONE-ACETAMINOPHEN 5-325 MG PO TABS
1.0000 | ORAL_TABLET | Freq: Once | ORAL | Status: AC
  Administered 2016-12-07: 1 via ORAL
  Filled 2016-12-07: qty 1

## 2016-12-07 MED ORDER — CYCLOBENZAPRINE HCL 10 MG PO TABS
10.0000 mg | ORAL_TABLET | Freq: Once | ORAL | Status: AC
  Administered 2016-12-07: 10 mg via ORAL
  Filled 2016-12-07: qty 1

## 2016-12-07 NOTE — ED Triage Notes (Signed)
Pt to triage via [place], report fell on stairs and other person fell on top of her.  Pt reports left ankle, left hip, left knee, left elbow and upper arm pain, and headache.

## 2016-12-07 NOTE — Discharge Instructions (Signed)
Continue previous medication for pain management clinic.

## 2016-12-07 NOTE — ED Notes (Signed)
Patient reports falling backward and her mother landing on top of her. Now reporting pain to left foot, knee, hip, and arm. Also complaining of pain in back and neck. No obvious swelling or deformity noted.

## 2016-12-07 NOTE — ED Provider Notes (Signed)
[facility] Emergency Department Provider Note   ____________________________________________   First MD Initiated Contact with Patient 12/07/16 1732     (approximate)  I have reviewed the triage vital signs and the nursing notes.   HISTORY  Chief Complaint Fall    HPI Sharon Welch is a 46 y.o. female patient complain of pain to neck, left elbow, upper arm, left hip, left knee, and left anklepain secondary to falling down 6 status. Patient said mother was following behind her and fell on top of her. Patient rates the pain as 8/10. Patient described a pain as "sharp". No palliative measure prior to arrival. Incident occurred approximately 2 hours ago. Patient is a forces concern about back pain secondary to having Harrington rods in place.   Past Medical History:  Diagnosis Date  . Bruising, spontaneous 12/08/2013  . Chronic back pain   . Chronic back pain   . DDD (degenerative disc disease), lumbar   . DDD (degenerative disc disease), lumbar   . Fibromyalgia   . Headache(784.0)   . Ovarian cyst   . Partial tear subscapularis tendon 12/27/2011  . Scoliosis   . Seizures ([place])    started 9/12-    Patient Active Problem List   Diagnosis Date Noted  . Migraine headache 09/25/2015  . 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 Bilateral occipital neuralgia 01/02/2015  . Migraine 01/02/2015  . 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 DDD (degenerative disc disease), cervical 10/27/2014  . 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 DDD (degenerative disc disease), lumbosacral 10/27/2014  . Partial tear subscapularis tendon 12/27/2011    Past Surgical History:  Procedure Laterality Date  . ABDOMINAL HYSTERECTOMY  2012  . BACK SURGERY  1989   scoliosis throsic-rods  . CESAREAN SECTION    . CHOLECYSTECTOMY N/A 11/17/2012   Procedure: LAPAROSCOPIC CHOLECYSTECTOMY WITH INTRAOPERATIVE CHOLANGIOGRAM;  Surgeon: Ralene Ok, MD;   Location: [facility];  Service: General;  Laterality: N/A;  . ERCP N/A 05/07/2013   Procedure: ENDOSCOPIC RETROGRADE CHOLANGIOPANCREATOGRAPHY (ERCP);  Surgeon: Beryle Beams, MD;  Location: [facility];  Service: Endoscopy;  Laterality: N/A;  start ercp first, if canulation fails switch to EUS   . EUS N/A 11/03/2012   Procedure: UPPER ENDOSCOPIC ULTRASOUND (EUS) LINEAR;  Surgeon: Beryle Beams, MD;  Location: [facility];  Service: Endoscopy;  Laterality: N/A;  . left arm    . NECK SURGERY    . TUBAL LIGATION      Prior to Admission medications   Medication Sig Start Date End Date Taking? Authorizing Provider  cefUROXime (CEFTIN) 250 MG tablet Take 1 tablet (250 mg total) by mouth 2 (two) times daily with a meal. Patient not taking: Reported on 01/02/2016 08/07/15   Mohammed Kindle, MD  cefUROXime (CEFTIN) 250 MG tablet Take 1 tablet (250 mg total) by mouth 2 (two) times daily with a meal. Patient not taking: Reported on 12/11/2015 12/11/15   Mohammed Kindle, MD  diazepam (VALIUM) 5 MG tablet Limit 1 tab by mouth 1 - 2 tabs by mouth per day  if tolerated 02/21/16   Mohammed Kindle, MD  diphenhydrAMINE (BENADRYL) 25 mg capsule Take 1 capsule (25 mg total) by mouth every 6 (six) hours as needed for itching. 10/17/15   Varney Biles, MD  doxycycline (VIBRAMYCIN) 100 MG capsule Take 1 capsule (100 mg total) by mouth 2 (two) times daily. Patient not taking: Reported on 11/27/2015 10/17/15   Varney Biles, MD  oxyCODONE (OXY IR/ROXICODONE)  5 MG immediate release tablet LIMIT 3 - 4  TABS PO / DAY IF TOLERATED 02/21/16   Mohammed Kindle, MD  promethazine (PHENERGAN) 12.5 MG tablet Take 12.5 mg by mouth every 8 (eight) hours as needed for nausea.  08/28/15   [provider]  SUMAtriptan (IMITREX) 25 MG tablet Take 25 mg by mouth every 2 (two) hours as needed for migraine (one a day as needed). May repeat in 2 hours if headache persists or recurs.    [provider]  venlafaxine (EFFEXOR) 75 MG  tablet Take 75 mg by mouth 2 (two) times daily.    [provider]    Allergies Tramadol  Family History  Problem Relation Age of Onset  . Hypertension Mother   . Hypertension Father   . Cancer Father        prostate  . Heart disease Father   . Hypertension Brother   . Hypertension Brother     Social History Social History  Substance Use Topics  . Smoking status: Current Every Day Smoker    Packs/day: 0.50    Years: 20.00    Types: Cigarettes    Last attempt to quit: 08/22/2013  . Smokeless tobacco: Never Used  . Alcohol use 0.0 oz/week     Comment: 1-2 sips a years    Review of Systems  Constitutional: No fever/chills Eyes: No visual changes. ENT: No sore throat. Cardiovascular: Denies chest pain. Respiratory: Denies shortness of breath. Gastrointestinal: No abdominal pain.  No nausea, no vomiting.  No diarrhea.  No constipation. Genitourinary: Negative for dysuria. Musculoskeletal:Chronic back pain secondary to scoliosis Skin: Negative for rash. Neurological: Negative for headaches, focal weakness or numbness. History of seizures Allergic/Immunilogical: Tramadol ____________________________________________   PHYSICAL EXAM:  VITAL SIGNS: ED Triage Vitals   Enc Vitals Group     BP (!) 145/77     Pulse Rate (!) 103     Resp 20     Temp 97.8 F (36.6 C)     Temp Source Oral     SpO2 98 %     Weight 96 lb (43.5 kg)     Height 5\' 1"  (1.549 m)     Head Circumference      Peak Flow      Pain Score 8     Pain Loc      Pain Edu?      Excl. in [place]?    Constitutional: Alert and oriented. Well appearing and in no acute distress. Head: Atraumatic. Nose: No congestion/rhinnorhea. Mouth/Throat: Mucous membranes are moist.  Oropharynx non-erythematous. Neck: No stridor.  No cervical spine tenderness to palpation. Hematological/Lymphatic/Immunilogical: No cervical lymphadenopathy. Cardiovascular: Normal rate, regular rhythm. Grossly normal heart sounds.   Good peripheral circulation. Respiratory: Normal respiratory effort.  No retractions. Lungs CTAB. Gastrointestinal: Soft and nontender. No distention. No abdominal bruits. No CVA tenderness. Musculoskeletal: No deformity to the upper and lower extremities. Patient has moderate overactive care measures secondary to  Neurologic:  Normal speech and language. No gross focal neurologic deficits are appreciated. No gait instability. Skin:  Skin is warm, dry and intact. No rash noted. No abrasion or ecchymosis secondary to this fall. Psychiatric: Mood and affect are normal. Speech and behavior are normal.  ____________________________________________   LABS (all labs ordered are listed, but only abnormal results are displayed)  Labs Reviewed - No data to display ____________________________________________  EKG   ____________________________________________  RADIOLOGY  Dg Lumbar Spine Complete  Result Date: 12/07/2016 CLINICAL DATA:  46 y/o  F; status post fall with history of prior Harrington rods. EXAM: LUMBAR SPINE - COMPLETE 4+ VIEW COMPARISON:  Chest and abdomen radiographs dated 05/07/2013. FINDINGS: Harrington rods are present extending from above the field of view into the L2 vertebral body level. Visualized portions of the rods appear intact and stable in position in comparison prior radiographs given differences in technique. There has been interval posterior instrumented fusion of the T12 through L4 vertebral bodies with pedicle screws and rods. Hardware appears intact. There is interval improvement of lumbar curvature. Vertebral body heights appear preserved. IMPRESSION: Visualized lower portion of Harrington rods are stable in configuration. T12 through L4 posterior instrumented fusion hardware appears intact. No acute fracture identified. Electronically Signed   By: Kristine Garbe M.D.   On: 12/07/2016 18:27   Dg Ankle Complete Left  Result Date: 12/07/2016 CLINICAL DATA:   46 y/o F; status post fall with left ankle, left hip, and left knee pain. EXAM: LEFT ANKLE COMPLETE - 3+ VIEW COMPARISON:  None. FINDINGS: There is no evidence of fracture, dislocation, or joint effusion. There is no evidence of arthropathy or other focal bone abnormality. Soft tissues are unremarkable. IMPRESSION: Negative. Electronically Signed   By: Kristine Garbe M.D.   On: 12/07/2016 18:22   Dg Hip Unilat With Pelvis 2-3 Views Left  Result Date: 12/07/2016 CLINICAL DATA:  46 y/o F; 46 y/o F; status post fall with left hip pain. EXAM: DG HIP (WITH OR WITHOUT PELVIS) 2-3V LEFT COMPARISON:  None. FINDINGS: There is no evidence of hip fracture or dislocation. There is no evidence of arthropathy or other focal bone abnormality. Partially visualized lower lumbar fusion hardware. IMPRESSION: No acute fracture or dislocation identified. Electronically Signed   By: Kristine Garbe M.D.   On: 12/07/2016 18:21   No acute findings x-ray of the lumbar spine, left hip, and left ankle. ____________________________________________   PROCEDURES  Procedure(s) performed: None  Procedures  Critical Care performed: No  ____________________________________________   INITIAL IMPRESSION / ASSESSMENT AND PLAN / ED COURSE  Pertinent labs & imaging results that were available during my care of the patient were reviewed by me and considered in my medical decision making (see chart for details).  Cervical strain, low back pain, left hip contusion, and left ankle pain secondary to a fall. Discussed neck x-ray finding with patient. Patient given discharge Instructions. Patient under pain management and advised to continue to this medications from pain clinic.       ____________________________________________   FINAL CLINICAL IMPRESSION(S) / ED DIAGNOSES  Final diagnoses:  Cervical strain, acute, initial encounter  Musculoskeletal pain      NEW MEDICATIONS STARTED DURING THIS  VISIT:  New Prescriptions   No medications on file     Note:  This document was prepared using Dragon voice recognition software and may include unintentional dictation errors.    Sable Feil, PA-C 12/07/16 1846    Merlyn Lot, MD 12/07/16 1947

## 2016-12-09 ENCOUNTER — Emergency Department (HOSPITAL_COMMUNITY)
Admission: EM | Admit: 2016-12-09 | Discharge: 2016-12-09 | Disposition: A | Discharge status: Home / Self Care | Payer: Medicare Other | Attending: Emergency Medicine | Admitting: Emergency Medicine

## 2016-12-09 ENCOUNTER — Emergency Department (HOSPITAL_COMMUNITY): Payer: Medicare Other

## 2016-12-09 ENCOUNTER — Encounter (HOSPITAL_COMMUNITY): Payer: Self-pay | Admitting: Emergency Medicine

## 2016-12-09 DIAGNOSIS — R51 Headache: Secondary | ICD-10-CM | POA: Insufficient documentation

## 2016-12-09 DIAGNOSIS — M791 Myalgia: Secondary | ICD-10-CM | POA: Diagnosis not present

## 2016-12-09 DIAGNOSIS — W19XXXA Unspecified fall, initial encounter: Secondary | ICD-10-CM

## 2016-12-09 DIAGNOSIS — M5416 Radiculopathy, lumbar region: Secondary | ICD-10-CM | POA: Diagnosis not present

## 2016-12-09 DIAGNOSIS — Z79891 Long term (current) use of opiate analgesic: Secondary | ICD-10-CM | POA: Diagnosis not present

## 2016-12-09 DIAGNOSIS — M5134 Other intervertebral disc degeneration, thoracic region: Secondary | ICD-10-CM | POA: Diagnosis not present

## 2016-12-09 DIAGNOSIS — M542 Cervicalgia: Secondary | ICD-10-CM | POA: Insufficient documentation

## 2016-12-09 DIAGNOSIS — Z79899 Other long term (current) drug therapy: Secondary | ICD-10-CM | POA: Diagnosis not present

## 2016-12-09 DIAGNOSIS — S199XXA Unspecified injury of neck, initial encounter: Secondary | ICD-10-CM | POA: Diagnosis not present

## 2016-12-09 DIAGNOSIS — M25562 Pain in left knee: Secondary | ICD-10-CM | POA: Diagnosis not present

## 2016-12-09 DIAGNOSIS — M5031 Other cervical disc degeneration,  high cervical region: Secondary | ICD-10-CM | POA: Diagnosis not present

## 2016-12-09 DIAGNOSIS — F17210 Nicotine dependence, cigarettes, uncomplicated: Secondary | ICD-10-CM | POA: Diagnosis not present

## 2016-12-09 DIAGNOSIS — M47817 Spondylosis without myelopathy or radiculopathy, lumbosacral region: Secondary | ICD-10-CM | POA: Diagnosis not present

## 2016-12-09 DIAGNOSIS — M461 Sacroiliitis, not elsewhere classified: Secondary | ICD-10-CM | POA: Diagnosis not present

## 2016-12-09 DIAGNOSIS — S0990XA Unspecified injury of head, initial encounter: Secondary | ICD-10-CM | POA: Diagnosis not present

## 2016-12-09 DIAGNOSIS — M5137 Other intervertebral disc degeneration, lumbosacral region: Secondary | ICD-10-CM | POA: Diagnosis not present

## 2016-12-09 DIAGNOSIS — M5481 Occipital neuralgia: Secondary | ICD-10-CM | POA: Diagnosis not present

## 2016-12-09 DIAGNOSIS — M47816 Spondylosis without myelopathy or radiculopathy, lumbar region: Secondary | ICD-10-CM | POA: Diagnosis not present

## 2016-12-09 DIAGNOSIS — M5033 Other cervical disc degeneration, cervicothoracic region: Secondary | ICD-10-CM | POA: Diagnosis not present

## 2016-12-09 MED ORDER — METHOCARBAMOL 500 MG PO TABS: 500.0000 mg | ORAL_TABLET | Freq: Two times a day (BID) | ORAL | 0 refills | Status: DC

## 2016-12-09 MED ORDER — DIAZEPAM 5 MG PO TABS
5.0000 mg | ORAL_TABLET | Freq: Once | ORAL | Status: AC
  Administered 2016-12-09: 5 mg via ORAL
  Filled 2016-12-09: qty 1

## 2016-12-09 MED ORDER — MORPHINE SULFATE (PF) 4 MG/ML IV SOLN
6.0000 mg | Freq: Once | INTRAVENOUS | Status: AC
  Administered 2016-12-09: 6 mg via INTRAMUSCULAR
  Filled 2016-12-09: qty 2

## 2016-12-09 MED ORDER — KETOROLAC TROMETHAMINE 60 MG/2ML IM SOLN
60.0000 mg | Freq: Once | INTRAMUSCULAR | Status: AC
  Administered 2016-12-09: 60 mg via INTRAMUSCULAR
  Filled 2016-12-09: qty 2

## 2016-12-09 NOTE — ED Notes (Signed)
Patient transported to CT 

## 2016-12-09 NOTE — ED Triage Notes (Signed)
Pt states she fell down the stairs with her mother and her mother fell on top of her. This occurred on Saturday, pt was seen at [facility]. Pt states she has a headache and neck pain and did not have a CT scan. Pt also states she was sent here by her MD at the pain clinic to have CT of left knee due to continued pain.

## 2016-12-09 NOTE — ED Notes (Signed)
Md evaluating patient.

## 2016-12-09 NOTE — ED Provider Notes (Signed)
[facility] DEPT Provider Note   CSN: [id number] Arrival date & time: 12/09/16  1524     History   Chief Complaint Chief Complaint  Patient presents with  . Fall  . Knee Pain    HPI ADLEAN HARDEMAN is a 46 y.o. female.   Fall  This is a new problem. The current episode started more than 2 days ago. The problem occurs constantly. The problem has not changed since onset.Associated symptoms include headaches. Pertinent negatives include no chest pain. Nothing aggravates the symptoms. Nothing relieves the symptoms. She has tried acetaminophen for the symptoms. The treatment provided mild relief.    Past Medical History:  Diagnosis Date  . Bruising, spontaneous 12/08/2013  . Chronic back pain   . Chronic back pain   . DDD (degenerative disc disease), lumbar   . DDD (degenerative disc disease), lumbar   . Fibromyalgia   . Headache(784.0)   . Ovarian cyst   . Partial tear subscapularis tendon 12/27/2011  . Scoliosis   . Seizures ([place])    started 9/12-    Patient Active Problem List   Diagnosis Date Noted  . Migraine headache 09/25/2015  . 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 Bilateral occipital neuralgia 01/02/2015  . Migraine 01/02/2015  . 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 DDD (degenerative disc disease), cervical 10/27/2014  . 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 DDD (degenerative disc disease), lumbosacral 10/27/2014  . Partial tear subscapularis tendon 12/27/2011    Past Surgical History:  Procedure Laterality Date  . ABDOMINAL HYSTERECTOMY  2012  . BACK SURGERY  1989   scoliosis throsic-rods  . CESAREAN SECTION    . CHOLECYSTECTOMY N/A 11/17/2012   Procedure: LAPAROSCOPIC CHOLECYSTECTOMY WITH INTRAOPERATIVE CHOLANGIOGRAM;  Surgeon: Ralene Ok, MD;  Location: [facility];  Service: General;  Laterality: N/A;  . ERCP N/A 05/07/2013   Procedure: ENDOSCOPIC RETROGRADE CHOLANGIOPANCREATOGRAPHY (ERCP);  Surgeon: Beryle Beams,  MD;  Location: [facility];  Service: Endoscopy;  Laterality: N/A;  start ercp first, if canulation fails switch to EUS   . EUS N/A 11/03/2012   Procedure: UPPER ENDOSCOPIC ULTRASOUND (EUS) LINEAR;  Surgeon: Beryle Beams, MD;  Location: [facility];  Service: Endoscopy;  Laterality: N/A;  . left arm    . NECK SURGERY    . TUBAL LIGATION      OB History    No data available       Home Medications    Prior to Admission medications   Medication Sig Start Date End Date Taking? Authorizing Provider  diazepam (VALIUM) 5 MG tablet Limit 1 tab by mouth 1 - 2 tabs by mouth per day  if tolerated 02/21/16   Mohammed Kindle, MD  diphenhydrAMINE (BENADRYL) 25 mg capsule Take 1 capsule (25 mg total) by mouth every 6 (six) hours as needed for itching. 10/17/15   Varney Biles, MD  methocarbamol (ROBAXIN) 500 MG tablet Take 1 tablet (500 mg total) by mouth 2 (two) times daily. 12/09/16   Risha Barretta, Corene Cornea, MD  oxyCODONE (OXY IR/ROXICODONE) 5 MG immediate release tablet LIMIT 3 - 4  TABS PO / DAY IF TOLERATED 02/21/16   Mohammed Kindle, MD  promethazine (PHENERGAN) 12.5 MG tablet Take 12.5 mg by mouth every 8 (eight) hours as needed for nausea.  08/28/15   [provider]  SUMAtriptan (IMITREX) 25 MG tablet Take 25 mg by mouth every 2 (two) hours as needed for migraine (one a day as needed). May repeat in 2 hours if  headache persists or recurs.    [provider]  venlafaxine (EFFEXOR) 75 MG tablet Take 75 mg by mouth 2 (two) times daily.    [provider]    Family History Family History  Problem Relation Age of Onset  . Hypertension Mother   . Hypertension Father   . Cancer Father        prostate  . Heart disease Father   . Hypertension Brother   . Hypertension Brother     Social History Social History  Substance Use Topics  . Smoking status: Current Every Day Smoker    Packs/day: 0.50    Years: 20.00    Types: Cigarettes  . Smokeless tobacco: Never Used  .  Alcohol use No     Comment: 1-2 sips a years     Allergies   Tramadol   Review of Systems Review of Systems  Cardiovascular: Negative for chest pain.  Musculoskeletal: Positive for neck pain.  Neurological: Positive for headaches.  All other systems reviewed and are negative.    Physical Exam Updated Vital Signs BP 130/60   Pulse 70   Temp 98 F (36.7 C)   Resp 18   Ht 5\' 1"  (1.549 m)   Wt 46.3 kg (102 lb)   SpO2 100%   BMI 19.27 kg/m   Physical Exam  Constitutional: She is oriented to person, place, and time. She appears well-developed and well-nourished.  HENT:  Head: Normocephalic and atraumatic.  Eyes: Conjunctivae and EOM are normal.  Neck: Normal range of motion.  Cardiovascular: Normal rate and regular rhythm.   Pulmonary/Chest: Effort normal and breath sounds normal. No stridor. No respiratory distress.  Abdominal: Soft. She exhibits no distension.  Musculoskeletal: Normal range of motion. She exhibits tenderness (C7 and bilateral trapezius). She exhibits no edema or deformity.  Neurological: She is alert and oriented to person, place, and time. No cranial nerve deficit. Coordination normal.  No altered mental status, able to give full seemingly accurate history.  Face is symmetric, EOM's intact, pupils equal and reactive, vision intact, tongue and uvula midline without deviation Upper and Lower extremity motor 5/5, intact pain perception in distal extremities, 2+ reflexes in biceps, patella and achilles tendons. Finger to nose normal, heel to shin normal. Walks without assistance or evident ataxia.    Skin: Skin is warm and dry.  Nursing note and vitals reviewed.    ED Treatments / Results  Labs (all labs ordered are listed, but only abnormal results are displayed) Labs Reviewed - No data to display  EKG  EKG Interpretation None       Radiology Ct Head Wo Contrast  Result Date: 12/09/2016 CLINICAL DATA:  Mother fell on patient. Concern for  head or cervical spine injury. Initial encounter. EXAM: CT HEAD WITHOUT CONTRAST CT CERVICAL SPINE WITHOUT CONTRAST TECHNIQUE: Multidetector CT imaging of the head and cervical spine was performed following the standard protocol without intravenous contrast. Multiplanar CT image reconstructions of the cervical spine were also generated. COMPARISON:  MRI of the brain and CT of the head performed 06/03/2014, and MRI of the cervical spine performed 10/25/2010 FINDINGS: CT HEAD FINDINGS Brain: No evidence of acute infarction, hemorrhage, hydrocephalus or extra-axial collection. 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 The posterior fossa, including the cerebellum, brainstem and fourth ventricle, is within normal limits. The third and lateral ventricles, and basal ganglia are unremarkable in appearance. The cerebral hemispheres are otherwise symmetric in appearance, with normal gray-white differentiation.  No mass effect or midline shift is seen. Vascular: No hyperdense vessel or unexpected calcification. Skull: There is no evidence of fracture; visualized osseous structures are unremarkable in appearance. Sinuses/Orbits: The orbits are within normal limits. The paranasal sinuses and mastoid air cells are well-aerated. Other: No significant soft tissue abnormalities are seen. CT CERVICAL SPINE FINDINGS Alignment: Normal. Skull base and vertebrae: No acute fracture. No primary bone lesion or focal pathologic process. Soft tissues and spinal canal: No prevertebral fluid or swelling. No visible canal hematoma. Disc levels: The patient is status post anterior cervical spinal fusion at C6-C7. Intervertebral disc spaces are otherwise preserved. The bony foramina are grossly unremarkable in appearance. Upper chest: Scattered emphysema is noted at the right lung apex. Associated airspace opacity may reflect atelectasis or scarring. A small calcification is noted at the right thyroid lobe, benign in  appearance. Other: No additional soft tissue abnormalities are seen. IMPRESSION: 1. No evidence of traumatic intracranial injury or fracture. 2. No evidence of fracture or subluxation along the cervical spine. 3. 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 4. Status post anterior cervical spinal fusion at C6-C7. 5. Scattered emphysema at the right lung apex. Associated airspace opacity may reflect atelectasis or scarring. Electronically Signed   By: Garald Balding M.D.   On: 12/09/2016 19:23   Ct Cervical Spine Wo Contrast  Result Date: 12/09/2016 CLINICAL DATA:  Mother fell on patient. Concern for head or cervical spine injury. Initial encounter. EXAM: CT HEAD WITHOUT CONTRAST CT CERVICAL SPINE WITHOUT CONTRAST TECHNIQUE: Multidetector CT imaging of the head and cervical spine was performed following the standard protocol without intravenous contrast. Multiplanar CT image reconstructions of the cervical spine were also generated. COMPARISON:  MRI of the brain and CT of the head performed 06/03/2014, and MRI of the cervical spine performed 10/25/2010 FINDINGS: CT HEAD FINDINGS Brain: No evidence of acute infarction, hemorrhage, hydrocephalus or extra-axial collection. 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 The posterior fossa, including the cerebellum, brainstem and fourth ventricle, is within normal limits. The third and lateral ventricles, and basal ganglia are unremarkable in appearance. The cerebral hemispheres are otherwise symmetric in appearance, with normal gray-white differentiation. No mass effect or midline shift is seen. Vascular: No hyperdense vessel or unexpected calcification. Skull: There is no evidence of fracture; visualized osseous structures are unremarkable in appearance. Sinuses/Orbits: The orbits are within normal limits. The paranasal sinuses and mastoid air cells are well-aerated. Other: No significant soft tissue abnormalities are seen.  CT CERVICAL SPINE FINDINGS Alignment: Normal. Skull base and vertebrae: No acute fracture. No primary bone lesion or focal pathologic process. Soft tissues and spinal canal: No prevertebral fluid or swelling. No visible canal hematoma. Disc levels: The patient is status post anterior cervical spinal fusion at C6-C7. Intervertebral disc spaces are otherwise preserved. The bony foramina are grossly unremarkable in appearance. Upper chest: Scattered emphysema is noted at the right lung apex. Associated airspace opacity may reflect atelectasis or scarring. A small calcification is noted at the right thyroid lobe, benign in appearance. Other: No additional soft tissue abnormalities are seen. IMPRESSION: 1. No evidence of traumatic intracranial injury or fracture. 2. No evidence of fracture or subluxation along the cervical spine. 3. 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 4. Status post anterior cervical spinal fusion at C6-C7. 5. Scattered emphysema at the right lung apex. Associated airspace opacity may reflect atelectasis or scarring. Electronically Signed   By: Jacqulynn Cadet  Chang M.D.   On: 12/09/2016 19:23    Procedures Procedures (including critical care time)  Medications Ordered in ED Medications  diazepam (VALIUM) tablet 5 mg (5 mg Oral Given 12/09/16 1838)  ketorolac (TORADOL) injection 60 mg (60 mg Intramuscular Given 12/09/16 1838)  morphine 4 MG/ML injection 6 mg (6 mg Intramuscular Given 12/09/16 1838)     Initial Impression / Assessment and Plan / ED Course  I have reviewed the triage vital signs and the nursing notes.  Pertinent labs & imaging results that were available during my care of the patient were reviewed by me and considered in my medical decision making (see chart for details).   [place] a couple days ago, hit head. Was evaluated at [facility] for ankle, knee and left hip pain. syncopized today and has had some persistent nausea and headache since fall so will ct  to ensure no persistent symptoms.   CT ok. Will plan for dc with tx for likely MSK cause for symptoms and follow up with PCP/pain management.   Final Clinical Impressions(s) / ED Diagnoses   Final diagnoses:  Fall, initial encounter  Neck pain    New Prescriptions Discharge Medication List as of 12/09/2016  8:18 PM    START taking these medications   Details  methocarbamol (ROBAXIN) 500 MG tablet Take 1 tablet (500 mg total) by mouth 2 (two) times daily., Starting Mon 12/09/2016, Print         Delcie Ruppert, Corene Cornea, MD 12/10/16 [phone]

## 2016-12-11 ENCOUNTER — Other Ambulatory Visit: Payer: Self-pay | Admitting: Pain Medicine

## 2016-12-11 ENCOUNTER — Other Ambulatory Visit (HOSPITAL_COMMUNITY): Payer: Self-pay | Admitting: Pain Medicine

## 2016-12-11 DIAGNOSIS — R110 Nausea: Secondary | ICD-10-CM

## 2016-12-11 DIAGNOSIS — R42 Dizziness and giddiness: Secondary | ICD-10-CM

## 2016-12-27 ENCOUNTER — Ambulatory Visit
Admission: RE | Admit: 2016-12-27 | Discharge: 2016-12-27 | Disposition: A | Discharge status: Home / Self Care | Payer: Medicare Other | Source: Ambulatory Visit | Attending: Pain Medicine | Admitting: Pain Medicine

## 2016-12-27 DIAGNOSIS — R110 Nausea: Secondary | ICD-10-CM | POA: Insufficient documentation

## 2016-12-27 DIAGNOSIS — R42 Dizziness and giddiness: Secondary | ICD-10-CM | POA: Diagnosis not present

## 2016-12-27 DIAGNOSIS — R270 Ataxia, unspecified: Secondary | ICD-10-CM | POA: Diagnosis not present

## 2017-01-07 DIAGNOSIS — M5031 Other cervical disc degeneration,  high cervical region: Secondary | ICD-10-CM | POA: Diagnosis not present

## 2017-01-07 DIAGNOSIS — G43109 Migraine with aura, not intractable, without status migrainosus: Secondary | ICD-10-CM | POA: Diagnosis not present

## 2017-01-07 DIAGNOSIS — M5033 Other cervical disc degeneration, cervicothoracic region: Secondary | ICD-10-CM | POA: Diagnosis not present

## 2017-01-07 DIAGNOSIS — M461 Sacroiliitis, not elsewhere classified: Secondary | ICD-10-CM | POA: Diagnosis not present

## 2017-01-07 DIAGNOSIS — M5136 Other intervertebral disc degeneration, lumbar region: Secondary | ICD-10-CM | POA: Diagnosis not present

## 2017-01-07 DIAGNOSIS — Z79891 Long term (current) use of opiate analgesic: Secondary | ICD-10-CM | POA: Diagnosis not present

## 2017-01-07 DIAGNOSIS — M791 Myalgia: Secondary | ICD-10-CM | POA: Diagnosis not present

## 2017-01-07 DIAGNOSIS — G894 Chronic pain syndrome: Secondary | ICD-10-CM | POA: Diagnosis not present

## 2017-01-07 DIAGNOSIS — M5134 Other intervertebral disc degeneration, thoracic region: Secondary | ICD-10-CM | POA: Diagnosis not present

## 2017-01-07 DIAGNOSIS — M5416 Radiculopathy, lumbar region: Secondary | ICD-10-CM | POA: Diagnosis not present

## 2017-01-07 DIAGNOSIS — M47817 Spondylosis without myelopathy or radiculopathy, lumbosacral region: Secondary | ICD-10-CM | POA: Diagnosis not present

## 2017-01-07 DIAGNOSIS — M5481 Occipital neuralgia: Secondary | ICD-10-CM | POA: Diagnosis not present

## 2017-02-04 DIAGNOSIS — M47817 Spondylosis without myelopathy or radiculopathy, lumbosacral region: Secondary | ICD-10-CM | POA: Diagnosis not present

## 2017-02-04 DIAGNOSIS — M5416 Radiculopathy, lumbar region: Secondary | ICD-10-CM | POA: Diagnosis not present

## 2017-02-04 DIAGNOSIS — M791 Myalgia: Secondary | ICD-10-CM | POA: Diagnosis not present

## 2017-02-04 DIAGNOSIS — M461 Sacroiliitis, not elsewhere classified: Secondary | ICD-10-CM | POA: Diagnosis not present

## 2017-02-17 DIAGNOSIS — M5416 Radiculopathy, lumbar region: Secondary | ICD-10-CM | POA: Diagnosis not present

## 2017-02-17 DIAGNOSIS — M791 Myalgia: Secondary | ICD-10-CM | POA: Diagnosis not present

## 2017-02-17 DIAGNOSIS — M47817 Spondylosis without myelopathy or radiculopathy, lumbosacral region: Secondary | ICD-10-CM | POA: Diagnosis not present

## 2017-02-17 DIAGNOSIS — M461 Sacroiliitis, not elsewhere classified: Secondary | ICD-10-CM | POA: Diagnosis not present

## 2017-03-07 ENCOUNTER — Encounter: Payer: Self-pay | Admitting: Emergency Medicine

## 2017-03-07 ENCOUNTER — Emergency Department
Admission: EM | Admit: 2017-03-07 | Discharge: 2017-03-07 | Disposition: A | Discharge status: Home / Self Care | Payer: Medicare Other | Attending: Student in an Organized Health Care Education/Training Program | Admitting: Student in an Organized Health Care Education/Training Program

## 2017-03-07 ENCOUNTER — Emergency Department: Payer: Medicare Other

## 2017-03-07 DIAGNOSIS — R51 Headache: Secondary | ICD-10-CM | POA: Diagnosis not present

## 2017-03-07 DIAGNOSIS — R569 Unspecified convulsions: Secondary | ICD-10-CM | POA: Insufficient documentation

## 2017-03-07 DIAGNOSIS — Z79899 Other long term (current) drug therapy: Secondary | ICD-10-CM | POA: Diagnosis not present

## 2017-03-07 DIAGNOSIS — I10 Essential (primary) hypertension: Secondary | ICD-10-CM | POA: Diagnosis not present

## 2017-03-07 DIAGNOSIS — R519 Headache, unspecified: Secondary | ICD-10-CM

## 2017-03-07 DIAGNOSIS — F17210 Nicotine dependence, cigarettes, uncomplicated: Secondary | ICD-10-CM | POA: Diagnosis not present

## 2017-03-07 LAB — CBC WITH DIFFERENTIAL/PLATELET
Basophils Absolute: 0.1 10*3/uL (ref 0–0.1)
Basophils Relative: 1 %
EOS ABS: 0.1 10*3/uL (ref 0–0.7)
Eosinophils Relative: 1 %
HEMATOCRIT: 39.9 % (ref 35.0–47.0)
HEMOGLOBIN: 14 g/dL (ref 12.0–16.0)
LYMPHS ABS: 1.7 10*3/uL (ref 1.0–3.6)
LYMPHS PCT: 21 %
MCH: 32.4 pg (ref 26.0–34.0)
MCHC: 35.1 g/dL (ref 32.0–36.0)
MCV: 92.4 fL (ref 80.0–100.0)
Monocytes Absolute: 0.8 10*3/uL (ref 0.2–0.9)
Monocytes Relative: 10 %
NEUTROS ABS: 5.3 10*3/uL (ref 1.4–6.5)
NEUTROS PCT: 67 %
Platelets: 222 10*3/uL (ref 150–440)
RBC: 4.32 MIL/uL (ref 3.80–5.20)
RDW: 12.4 % (ref 11.5–14.5)
WBC: 7.9 10*3/uL (ref 3.6–11.0)

## 2017-03-07 LAB — BASIC METABOLIC PANEL
Anion gap: 9 (ref 5–15)
BUN: 16 mg/dL (ref 6–20)
CHLORIDE: 105 mmol/L (ref 101–111)
CO2: 27 mmol/L (ref 22–32)
Calcium: 9.1 mg/dL (ref 8.9–10.3)
Creatinine, Ser: 0.66 mg/dL (ref 0.44–1.00)
GFR calc Af Amer: >60 mL/min (ref >60)
GFR calc non Af Amer: >60 mL/min (ref >60)
Glucose, Bld: 126 mg/dL — ABNORMAL HIGH (ref 65–99)
POTASSIUM: 3.2 mmol/L — AB (ref 3.5–5.1)
SODIUM: 141 mmol/L (ref 135–145)

## 2017-03-07 MED ORDER — DIPHENHYDRAMINE HCL 50 MG/ML IJ SOLN
25.0000 mg | Freq: Once | INTRAMUSCULAR | Status: AC
Start: 2017-03-07 — End: 2017-03-07
  Administered 2017-03-07: 25 mg via INTRAVENOUS
  Filled 2017-03-07: qty 1

## 2017-03-07 MED ORDER — ACETAMINOPHEN 500 MG PO TABS
1000.0000 mg | ORAL_TABLET | Freq: Once | ORAL | Status: AC
  Administered 2017-03-07: 1000 mg via ORAL
  Filled 2017-03-07: qty 2

## 2017-03-07 MED ORDER — SODIUM CHLORIDE 0.9 % IV BOLUS (SEPSIS)
1000.0000 mL | Freq: Once | INTRAVENOUS | Status: AC
  Administered 2017-03-07: 1000 mL via INTRAVENOUS

## 2017-03-07 MED ORDER — MAGNESIUM SULFATE 2 GM/50ML IV SOLN
2.0000 g | Freq: Once | INTRAVENOUS | Status: AC
  Administered 2017-03-07: 2 g via INTRAVENOUS
  Filled 2017-03-07 (×2): qty 50

## 2017-03-07 MED ORDER — PROCHLORPERAZINE EDISYLATE 5 MG/ML IJ SOLN
10.0000 mg | Freq: Once | INTRAMUSCULAR | Status: AC
  Administered 2017-03-07: 10 mg via INTRAVENOUS
  Filled 2017-03-07: qty 2

## 2017-03-07 NOTE — ED Notes (Signed)
Pharmacy called again, spoke with Corene Cornea, regarding Mag Sulfate for the patient.

## 2017-03-07 NOTE — ED Provider Notes (Signed)
[facility] Emergency Department Provider Note    First MD Initiated Contact with Patient 03/07/17 1855     (approximate)  I have reviewed the triage vital signs and the nursing notes.   HISTORY  Chief Complaint Seizures    HPI Sharon Welch is a 46 y.o. female With a history of pseudoseizures as well as fibromyalgia and chronic migraine headaches presents from home after multiple brief 32nd generalized tonic-clonic shaking spells that started after she had development of a migraine headache. States that this is happened in the past. No fevers. Currently rates her headache as 10 out of 10 in severity. Does endorse photophobia. No neck stiffness. No abdominal pain. No chest pain. No recent medication changes. She's not on any abortive therapy at home.   Past Medical History:  Diagnosis Date  . Bruising, spontaneous 12/08/2013  . Chronic back pain   . Chronic back pain   . DDD (degenerative disc disease), lumbar   . DDD (degenerative disc disease), lumbar   . Fibromyalgia   . Headache(784.0)   . Ovarian cyst   . Partial tear subscapularis tendon 12/27/2011  . Scoliosis   . Seizures ([place])    started 9/12-   Family History  Problem Relation Age of Onset  . Hypertension Mother   . Hypertension Father   . Cancer Father        prostate  . Heart disease Father   . Hypertension Brother   . Hypertension Brother    Past Surgical History:  Procedure Laterality Date  . ABDOMINAL HYSTERECTOMY  2012  . BACK SURGERY  1989   scoliosis throsic-rods  . CESAREAN SECTION    . CHOLECYSTECTOMY N/A 11/17/2012   Procedure: LAPAROSCOPIC CHOLECYSTECTOMY WITH INTRAOPERATIVE CHOLANGIOGRAM;  Surgeon: Ralene Ok, MD;  Location: [place];  Service: General;  Laterality: N/A;  . ERCP N/A 05/07/2013   Procedure: ENDOSCOPIC RETROGRADE CHOLANGIOPANCREATOGRAPHY (ERCP);  Surgeon: Beryle Beams, MD;  Location: [facility];  Service: Endoscopy;  Laterality: N/A;  start ercp  first, if canulation fails switch to EUS   . EUS N/A 11/03/2012   Procedure: UPPER ENDOSCOPIC ULTRASOUND (EUS) LINEAR;  Surgeon: Beryle Beams, MD;  Location: [facility];  Service: Endoscopy;  Laterality: N/A;  . left arm    . NECK SURGERY    . TUBAL LIGATION     Patient Active Problem List   Diagnosis Date Noted  . Migraine headache 09/25/2015  . 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 Bilateral occipital neuralgia 01/02/2015  . Migraine 01/02/2015  . 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 DDD (degenerative disc disease), cervical 10/27/2014  . 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 DDD (degenerative disc disease), lumbosacral 10/27/2014  . Partial tear subscapularis tendon 12/27/2011      Prior to Admission medications   Medication Sig Start Date End Date Taking? Authorizing Provider  diazepam (VALIUM) 5 MG tablet Limit 1 tab by mouth 1 - 2 tabs by mouth per day  if tolerated Patient taking differently: Take 0.5-1 mg by mouth daily.  02/21/16  Yes Mohammed Kindle, MD  diphenhydrAMINE (BENADRYL) 25 mg capsule Take 1 capsule (25 mg total) by mouth every 6 (six) hours as needed for itching. 10/17/15  Yes Nanavati, Ankit, MD  oxyCODONE (OXY IR/ROXICODONE) 5 MG immediate release tablet LIMIT 3 - 4  TABS PO / DAY IF TOLERATED 02/21/16  Yes Mohammed Kindle, MD  promethazine (PHENERGAN) 12.5 MG tablet Take 12.5 mg by mouth every 8 (eight)  hours as needed for nausea.  08/28/15  Yes [provider]  SUMAtriptan (IMITREX) 25 MG tablet Take 25 mg by mouth every 2 (two) hours as needed for migraine (one a day as needed). May repeat in 2 hours if headache persists or recurs.   Yes [provider]  methocarbamol (ROBAXIN) 500 MG tablet Take 1 tablet (500 mg total) by mouth 2 (two) times daily. Patient not taking: Reported on 03/07/2017 12/09/16   Mesner, Corene Cornea, MD    Allergies Tramadol    Social History Social History  Substance Use Topics  .  Smoking status: Current Every Day Smoker    Packs/day: 0.50    Years: 20.00    Types: Cigarettes  . Smokeless tobacco: Never Used  . Alcohol use No     Comment: 1-2 sips a years    Review of Systems Patient denies headaches, rhinorrhea, blurry vision, numbness, shortness of breath, chest pain, edema, cough, abdominal pain, nausea, vomiting, diarrhea, dysuria, fevers, rashes or hallucinations unless otherwise stated above in HPI. ____________________________________________   PHYSICAL EXAM:  VITAL SIGNS: Vitals:   03/07/17 1945 03/07/17 2018  BP: 123/67 113/64  Pulse: 61 65  Resp: 19 15  Temp:    SpO2: 98% 97%    Constitutional: Alert and oriented in no acute distress. Eyes: Conjunctivae are normal.  Head: Atraumatic. Nose: No congestion/rhinnorhea. Mouth/Throat: Mucous membranes are moist.   Neck: No stridor. Painless ROM.  Cardiovascular: Normal rate, regular rhythm. Grossly normal heart sounds.  Good peripheral circulation. Respiratory: Normal respiratory effort.  No retractions. Lungs CTAB. Gastrointestinal: Soft and nontender. No distention. No abdominal bruits. No CVA tenderness. Musculoskeletal: No lower extremity tenderness nor edema.  No joint effusions. Neurologic:  CN- intact.  No facial droop, Normal FNF.  Normal heel to shin.  Sensation intact bilaterally. Normal speech and language. No gross focal neurologic deficits are appreciated. No gait instability.  Skin:  Skin is warm, dry and intact. No rash noted. Psychiatric: Mood and affect are normal. Speech and behavior are normal.  ____________________________________________   LABS (all labs ordered are listed, but only abnormal results are displayed)  Results for orders placed or performed during the hospital encounter of 03/07/17 (from the past 24 hour(s))  CBC with Differential/Platelet     Status: None   Collection Time: 03/07/17  7:44 PM  Result Value Ref Range   WBC 7.9 3.6 - 11.0 K/uL   RBC 4.32  3.80 - 5.20 MIL/uL   Hemoglobin 14.0 12.0 - 16.0 g/dL   HCT 39.9 35.0 - 47.0 %   MCV 92.4 80.0 - 100.0 fL   MCH 32.4 26.0 - 34.0 pg   MCHC 35.1 32.0 - 36.0 g/dL   RDW 12.4 11.5 - 14.5 %   Platelets 222 150 - 440 K/uL   Neutrophils Relative % 67 %   Neutro Abs 5.3 1.4 - 6.5 K/uL   Lymphocytes Relative 21 %   Lymphs Abs 1.7 1.0 - 3.6 K/uL   Monocytes Relative 10 %   Monocytes Absolute 0.8 0.2 - 0.9 K/uL   Eosinophils Relative 1 %   Eosinophils Absolute 0.1 0 - 0.7 K/uL   Basophils Relative 1 %   Basophils Absolute 0.1 0 - 0.1 K/uL  Basic metabolic panel     Status: Abnormal   Collection Time: 03/07/17  7:44 PM  Result Value Ref Range   Sodium 141 135 - 145 mmol/L   Potassium 3.2 (L) 3.5 - 5.1 mmol/L   Chloride 105 101 -  111 mmol/L   CO2 27 22 - 32 mmol/L   Glucose, Bld 126 (H) 65 - 99 mg/dL   BUN 16 6 - 20 mg/dL   Creatinine, Ser 0.66 0.44 - 1.00 mg/dL   Calcium 9.1 8.9 - 10.3 mg/dL   GFR calc non Af Amer >60 >60 mL/min   GFR calc Af Amer >60 >60 mL/min   Anion gap 9 5 - 15   ____________________________________________  EKG My review and personal interpretation at Time: 18:52   Indication: seizure  Rate: 85  Rhythm: sinus Axis: normal Other: no stemi, non specific st changes, ____________________________________________  RADIOLOGY  I personally reviewed all radiographic images ordered to evaluate for the above acute complaints and reviewed radiology reports and findings.  These findings were personally discussed with the patient.  Please see medical record for radiology report.  ____________________________________________   PROCEDURES  Procedure(s) performed:  Procedures    Critical Care performed: no ____________________________________________   INITIAL IMPRESSION / ASSESSMENT AND PLAN / ED COURSE  Pertinent labs & imaging results that were available during my care of the patient were reviewed by me and considered in my medical decision making (see chart  for details).  DDX: migraine, seizure, electrolyte abn, meningitis, iph  VANISHA WHITEN is a 46 y.o. who presents to the ED with p/w HA and seizure like activity. Not worst HA ever. HA similar to previous episodes.  endorses a history of similar seizure-like episodes. Denies focal neurologic symptoms. Denies trauma. No fevers or neck pain. No vision loss. Afebrile in ED. VSS. Exam as above. No meningeal signs. No CN, motor, sensory or cerebellar deficits. Temporal arteries palpable and non-tender. Appears well and non-toxic.  Will provide IV fluids for hydration and IV medications for symptom control. Clinical picture is not consistent with ICH, SAH, SDH, EDH, TIA, or CVA. No concern for meningitis or encephalitis. No concern for GCA/Temporal arteritis.DT head with no acute abnormality.    ----------------------------------------- 8:36 PM on 03/07/2017 -----------------------------------------   Pain improved. Repeat neuro exam is again without focal deficit, nuchal rigidity or evidence of meningeal irritation.  patient will be given magnesium is she's saying that she still having some discomfort. Patient will be signed out to Dr. Rip Harbour. anticipate discharge home.        ____________________________________________   FINAL CLINICAL IMPRESSION(S) / ED DIAGNOSES  Final diagnoses:  Seizure-like activity ([place])  Acute nonintractable headache, unspecified headache type      NEW MEDICATIONS STARTED DURING THIS VISIT:  New Prescriptions   No medications on file     Note:  This document was prepared using Dragon voice recognition software and may include unintentional dictation errors.    Merlyn Lot, MD 03/07/17 2037

## 2017-03-07 NOTE — ED Triage Notes (Signed)
Patient presents to ED via POV from home post seizure. Hx of same. Patient denies taking medication for seizure. Patient states, "it is triggered by migraines".

## 2017-03-07 NOTE — Discharge Instructions (Signed)

## 2017-03-07 NOTE — ED Notes (Signed)

## 2017-03-07 NOTE — ED Notes (Signed)
Pharmacy called, spoke with Corene Cornea, about sending up Mag Sulfate for the patient.

## 2017-03-13 DIAGNOSIS — J019 Acute sinusitis, unspecified: Secondary | ICD-10-CM | POA: Diagnosis not present

## 2017-03-18 DIAGNOSIS — M419 Scoliosis, unspecified: Secondary | ICD-10-CM | POA: Diagnosis not present

## 2017-03-18 DIAGNOSIS — Z5181 Encounter for therapeutic drug level monitoring: Secondary | ICD-10-CM | POA: Diagnosis not present

## 2017-03-18 DIAGNOSIS — Z79891 Long term (current) use of opiate analgesic: Secondary | ICD-10-CM | POA: Diagnosis not present

## 2017-03-18 DIAGNOSIS — G43909 Migraine, unspecified, not intractable, without status migrainosus: Secondary | ICD-10-CM | POA: Diagnosis not present

## 2017-03-18 DIAGNOSIS — M47817 Spondylosis without myelopathy or radiculopathy, lumbosacral region: Secondary | ICD-10-CM | POA: Diagnosis not present

## 2017-03-18 DIAGNOSIS — M5416 Radiculopathy, lumbar region: Secondary | ICD-10-CM | POA: Diagnosis not present

## 2017-03-18 DIAGNOSIS — M461 Sacroiliitis, not elsewhere classified: Secondary | ICD-10-CM | POA: Diagnosis not present

## 2017-03-18 DIAGNOSIS — M5030 Other cervical disc degeneration, unspecified cervical region: Secondary | ICD-10-CM | POA: Diagnosis not present

## 2017-04-23 DIAGNOSIS — G580 Intercostal neuropathy: Secondary | ICD-10-CM | POA: Diagnosis not present

## 2017-04-23 DIAGNOSIS — Z5181 Encounter for therapeutic drug level monitoring: Secondary | ICD-10-CM | POA: Diagnosis not present

## 2017-04-23 DIAGNOSIS — M5030 Other cervical disc degeneration, unspecified cervical region: Secondary | ICD-10-CM | POA: Diagnosis not present

## 2017-04-23 DIAGNOSIS — M259 Joint disorder, unspecified: Secondary | ICD-10-CM | POA: Diagnosis not present

## 2017-04-23 DIAGNOSIS — M5481 Occipital neuralgia: Secondary | ICD-10-CM | POA: Diagnosis not present

## 2017-05-21 DIAGNOSIS — M419 Scoliosis, unspecified: Secondary | ICD-10-CM | POA: Diagnosis not present

## 2017-05-21 DIAGNOSIS — Z5181 Encounter for therapeutic drug level monitoring: Secondary | ICD-10-CM | POA: Diagnosis not present

## 2017-05-21 DIAGNOSIS — M5030 Other cervical disc degeneration, unspecified cervical region: Secondary | ICD-10-CM | POA: Diagnosis not present

## 2017-05-21 DIAGNOSIS — G43909 Migraine, unspecified, not intractable, without status migrainosus: Secondary | ICD-10-CM | POA: Diagnosis not present

## 2017-05-28 DIAGNOSIS — F17210 Nicotine dependence, cigarettes, uncomplicated: Secondary | ICD-10-CM | POA: Diagnosis not present

## 2017-05-28 DIAGNOSIS — Z79899 Other long term (current) drug therapy: Secondary | ICD-10-CM | POA: Diagnosis not present

## 2017-05-28 DIAGNOSIS — G43709 Chronic migraine without aura, not intractable, without status migrainosus: Secondary | ICD-10-CM | POA: Diagnosis not present

## 2017-05-28 DIAGNOSIS — Q8501 Neurofibromatosis, type 1: Secondary | ICD-10-CM | POA: Diagnosis not present

## 2017-05-28 DIAGNOSIS — F445 Conversion disorder with seizures or convulsions: Secondary | ICD-10-CM | POA: Diagnosis not present

## 2017-06-20 DIAGNOSIS — M5030 Other cervical disc degeneration, unspecified cervical region: Secondary | ICD-10-CM | POA: Diagnosis not present

## 2017-06-20 DIAGNOSIS — G43909 Migraine, unspecified, not intractable, without status migrainosus: Secondary | ICD-10-CM | POA: Diagnosis not present

## 2017-06-20 DIAGNOSIS — Z5181 Encounter for therapeutic drug level monitoring: Secondary | ICD-10-CM | POA: Diagnosis not present

## 2017-06-20 DIAGNOSIS — M419 Scoliosis, unspecified: Secondary | ICD-10-CM | POA: Diagnosis not present

## 2017-07-15 DIAGNOSIS — Z5181 Encounter for therapeutic drug level monitoring: Secondary | ICD-10-CM | POA: Diagnosis not present

## 2017-07-15 DIAGNOSIS — M419 Scoliosis, unspecified: Secondary | ICD-10-CM | POA: Diagnosis not present

## 2017-07-15 DIAGNOSIS — G43909 Migraine, unspecified, not intractable, without status migrainosus: Secondary | ICD-10-CM | POA: Diagnosis not present

## 2017-07-15 DIAGNOSIS — M5030 Other cervical disc degeneration, unspecified cervical region: Secondary | ICD-10-CM | POA: Diagnosis not present

## 2017-07-31 ENCOUNTER — Encounter (HOSPITAL_COMMUNITY): Payer: Self-pay

## 2017-07-31 ENCOUNTER — Emergency Department (HOSPITAL_COMMUNITY)
Admission: EM | Admit: 2017-07-31 | Discharge: 2017-07-31 | Disposition: A | Discharge status: Home / Self Care | Payer: Medicare Other | Attending: Emergency Medicine | Admitting: Emergency Medicine

## 2017-07-31 ENCOUNTER — Other Ambulatory Visit: Payer: Self-pay

## 2017-07-31 DIAGNOSIS — R51 Headache: Secondary | ICD-10-CM | POA: Insufficient documentation

## 2017-07-31 DIAGNOSIS — Z5321 Procedure and treatment not carried out due to patient leaving prior to being seen by health care provider: Secondary | ICD-10-CM | POA: Diagnosis not present

## 2017-07-31 NOTE — ED Triage Notes (Signed)
Reports of headache that started about 45 minutes ago. Took Imitrex with no relief. Reports of light sensitivity and nausea.

## 2017-08-12 DIAGNOSIS — M419 Scoliosis, unspecified: Secondary | ICD-10-CM | POA: Diagnosis not present

## 2017-08-12 DIAGNOSIS — G43909 Migraine, unspecified, not intractable, without status migrainosus: Secondary | ICD-10-CM | POA: Diagnosis not present

## 2017-08-12 DIAGNOSIS — M5030 Other cervical disc degeneration, unspecified cervical region: Secondary | ICD-10-CM | POA: Diagnosis not present

## 2017-08-12 DIAGNOSIS — Z5181 Encounter for therapeutic drug level monitoring: Secondary | ICD-10-CM | POA: Diagnosis not present

## 2017-09-15 DIAGNOSIS — Z5181 Encounter for therapeutic drug level monitoring: Secondary | ICD-10-CM | POA: Diagnosis not present

## 2017-09-15 DIAGNOSIS — M5030 Other cervical disc degeneration, unspecified cervical region: Secondary | ICD-10-CM | POA: Diagnosis not present

## 2017-09-15 DIAGNOSIS — M419 Scoliosis, unspecified: Secondary | ICD-10-CM | POA: Diagnosis not present

## 2017-09-15 DIAGNOSIS — G43909 Migraine, unspecified, not intractable, without status migrainosus: Secondary | ICD-10-CM | POA: Diagnosis not present

## 2017-10-13 DIAGNOSIS — M5030 Other cervical disc degeneration, unspecified cervical region: Secondary | ICD-10-CM | POA: Diagnosis not present

## 2017-10-13 DIAGNOSIS — G43909 Migraine, unspecified, not intractable, without status migrainosus: Secondary | ICD-10-CM | POA: Diagnosis not present

## 2017-10-13 DIAGNOSIS — M419 Scoliosis, unspecified: Secondary | ICD-10-CM | POA: Diagnosis not present

## 2017-10-13 DIAGNOSIS — Z5181 Encounter for therapeutic drug level monitoring: Secondary | ICD-10-CM | POA: Diagnosis not present

## 2017-11-10 DIAGNOSIS — G43909 Migraine, unspecified, not intractable, without status migrainosus: Secondary | ICD-10-CM | POA: Diagnosis not present

## 2017-11-10 DIAGNOSIS — M419 Scoliosis, unspecified: Secondary | ICD-10-CM | POA: Diagnosis not present

## 2017-11-10 DIAGNOSIS — M5030 Other cervical disc degeneration, unspecified cervical region: Secondary | ICD-10-CM | POA: Diagnosis not present

## 2017-11-10 DIAGNOSIS — Z5181 Encounter for therapeutic drug level monitoring: Secondary | ICD-10-CM | POA: Diagnosis not present

## 2017-12-08 DIAGNOSIS — G43909 Migraine, unspecified, not intractable, without status migrainosus: Secondary | ICD-10-CM | POA: Diagnosis not present

## 2017-12-08 DIAGNOSIS — Z5181 Encounter for therapeutic drug level monitoring: Secondary | ICD-10-CM | POA: Diagnosis not present

## 2017-12-08 DIAGNOSIS — M419 Scoliosis, unspecified: Secondary | ICD-10-CM | POA: Diagnosis not present

## 2017-12-08 DIAGNOSIS — M5030 Other cervical disc degeneration, unspecified cervical region: Secondary | ICD-10-CM | POA: Diagnosis not present

## 2018-01-05 DIAGNOSIS — Z5181 Encounter for therapeutic drug level monitoring: Secondary | ICD-10-CM | POA: Diagnosis not present

## 2018-01-05 DIAGNOSIS — M5030 Other cervical disc degeneration, unspecified cervical region: Secondary | ICD-10-CM | POA: Diagnosis not present

## 2018-01-05 DIAGNOSIS — G43909 Migraine, unspecified, not intractable, without status migrainosus: Secondary | ICD-10-CM | POA: Diagnosis not present

## 2018-01-05 DIAGNOSIS — M419 Scoliosis, unspecified: Secondary | ICD-10-CM | POA: Diagnosis not present

## 2018-01-14 DIAGNOSIS — M5030 Other cervical disc degeneration, unspecified cervical region: Secondary | ICD-10-CM | POA: Diagnosis not present

## 2018-01-14 DIAGNOSIS — G43909 Migraine, unspecified, not intractable, without status migrainosus: Secondary | ICD-10-CM | POA: Diagnosis not present

## 2018-01-14 DIAGNOSIS — Z5181 Encounter for therapeutic drug level monitoring: Secondary | ICD-10-CM | POA: Diagnosis not present

## 2018-01-14 DIAGNOSIS — M419 Scoliosis, unspecified: Secondary | ICD-10-CM | POA: Diagnosis not present

## 2018-02-09 DIAGNOSIS — Z5181 Encounter for therapeutic drug level monitoring: Secondary | ICD-10-CM | POA: Diagnosis not present

## 2018-02-09 DIAGNOSIS — M5030 Other cervical disc degeneration, unspecified cervical region: Secondary | ICD-10-CM | POA: Diagnosis not present

## 2018-02-09 DIAGNOSIS — M419 Scoliosis, unspecified: Secondary | ICD-10-CM | POA: Diagnosis not present

## 2018-02-09 DIAGNOSIS — G43909 Migraine, unspecified, not intractable, without status migrainosus: Secondary | ICD-10-CM | POA: Diagnosis not present

## 2018-02-24 DIAGNOSIS — L439 Lichen planus, unspecified: Secondary | ICD-10-CM | POA: Diagnosis not present

## 2018-03-25 DIAGNOSIS — M5030 Other cervical disc degeneration, unspecified cervical region: Secondary | ICD-10-CM | POA: Diagnosis not present

## 2018-03-25 DIAGNOSIS — Z5181 Encounter for therapeutic drug level monitoring: Secondary | ICD-10-CM | POA: Diagnosis not present

## 2018-03-25 DIAGNOSIS — G43909 Migraine, unspecified, not intractable, without status migrainosus: Secondary | ICD-10-CM | POA: Diagnosis not present

## 2018-03-25 DIAGNOSIS — M419 Scoliosis, unspecified: Secondary | ICD-10-CM | POA: Diagnosis not present

## 2018-04-22 DIAGNOSIS — M5030 Other cervical disc degeneration, unspecified cervical region: Secondary | ICD-10-CM | POA: Diagnosis not present

## 2018-04-22 DIAGNOSIS — M419 Scoliosis, unspecified: Secondary | ICD-10-CM | POA: Diagnosis not present

## 2018-04-22 DIAGNOSIS — G43909 Migraine, unspecified, not intractable, without status migrainosus: Secondary | ICD-10-CM | POA: Diagnosis not present

## 2018-04-22 DIAGNOSIS — Z5181 Encounter for therapeutic drug level monitoring: Secondary | ICD-10-CM | POA: Diagnosis not present

## 2018-05-27 DIAGNOSIS — Z5181 Encounter for therapeutic drug level monitoring: Secondary | ICD-10-CM | POA: Diagnosis not present

## 2018-05-27 DIAGNOSIS — M5030 Other cervical disc degeneration, unspecified cervical region: Secondary | ICD-10-CM | POA: Diagnosis not present

## 2018-05-27 DIAGNOSIS — G43909 Migraine, unspecified, not intractable, without status migrainosus: Secondary | ICD-10-CM | POA: Diagnosis not present

## 2018-05-27 DIAGNOSIS — M419 Scoliosis, unspecified: Secondary | ICD-10-CM | POA: Diagnosis not present

## 2018-06-25 DIAGNOSIS — M419 Scoliosis, unspecified: Secondary | ICD-10-CM | POA: Diagnosis not present

## 2018-06-25 DIAGNOSIS — Z5181 Encounter for therapeutic drug level monitoring: Secondary | ICD-10-CM | POA: Diagnosis not present

## 2018-06-25 DIAGNOSIS — G43909 Migraine, unspecified, not intractable, without status migrainosus: Secondary | ICD-10-CM | POA: Diagnosis not present

## 2018-06-25 DIAGNOSIS — M5030 Other cervical disc degeneration, unspecified cervical region: Secondary | ICD-10-CM | POA: Diagnosis not present

## 2018-07-22 DIAGNOSIS — G43909 Migraine, unspecified, not intractable, without status migrainosus: Secondary | ICD-10-CM | POA: Diagnosis not present

## 2018-07-22 DIAGNOSIS — M5030 Other cervical disc degeneration, unspecified cervical region: Secondary | ICD-10-CM | POA: Diagnosis not present

## 2018-07-22 DIAGNOSIS — M419 Scoliosis, unspecified: Secondary | ICD-10-CM | POA: Diagnosis not present

## 2018-07-22 DIAGNOSIS — Z5181 Encounter for therapeutic drug level monitoring: Secondary | ICD-10-CM | POA: Diagnosis not present

## 2018-07-23 DIAGNOSIS — J019 Acute sinusitis, unspecified: Secondary | ICD-10-CM | POA: Diagnosis not present

## 2018-07-23 DIAGNOSIS — Z681 Body mass index (BMI) 19 or less, adult: Secondary | ICD-10-CM | POA: Diagnosis not present

## 2018-07-23 DIAGNOSIS — R82998 Other abnormal findings in urine: Secondary | ICD-10-CM | POA: Diagnosis not present

## 2018-07-23 DIAGNOSIS — Z0001 Encounter for general adult medical examination with abnormal findings: Secondary | ICD-10-CM | POA: Diagnosis not present

## 2018-07-23 DIAGNOSIS — R238 Other skin changes: Secondary | ICD-10-CM | POA: Diagnosis not present

## 2018-07-23 DIAGNOSIS — Z0000 Encounter for general adult medical examination without abnormal findings: Secondary | ICD-10-CM | POA: Diagnosis not present

## 2018-07-24 ENCOUNTER — Other Ambulatory Visit (HOSPITAL_COMMUNITY): Payer: Self-pay | Admitting: Physician Assistant

## 2018-07-24 DIAGNOSIS — Z1231 Encounter for screening mammogram for malignant neoplasm of breast: Secondary | ICD-10-CM

## 2018-08-19 DIAGNOSIS — M4726 Other spondylosis with radiculopathy, lumbar region: Secondary | ICD-10-CM | POA: Diagnosis not present

## 2018-08-19 DIAGNOSIS — G894 Chronic pain syndrome: Secondary | ICD-10-CM | POA: Diagnosis not present

## 2018-08-19 DIAGNOSIS — M79609 Pain in unspecified limb: Secondary | ICD-10-CM | POA: Diagnosis not present

## 2018-08-19 DIAGNOSIS — G8929 Other chronic pain: Secondary | ICD-10-CM | POA: Diagnosis not present

## 2018-08-19 DIAGNOSIS — M47897 Other spondylosis, lumbosacral region: Secondary | ICD-10-CM | POA: Diagnosis not present

## 2018-08-19 DIAGNOSIS — M48062 Spinal stenosis, lumbar region with neurogenic claudication: Secondary | ICD-10-CM | POA: Diagnosis not present

## 2018-09-09 DIAGNOSIS — Z1389 Encounter for screening for other disorder: Secondary | ICD-10-CM | POA: Diagnosis not present

## 2018-09-09 DIAGNOSIS — J329 Chronic sinusitis, unspecified: Secondary | ICD-10-CM | POA: Diagnosis not present

## 2018-09-09 DIAGNOSIS — Z681 Body mass index (BMI) 19 or less, adult: Secondary | ICD-10-CM | POA: Diagnosis not present

## 2018-09-14 DIAGNOSIS — G894 Chronic pain syndrome: Secondary | ICD-10-CM | POA: Diagnosis not present

## 2018-09-17 ENCOUNTER — Emergency Department (HOSPITAL_COMMUNITY)
Admission: EM | Admit: 2018-09-17 | Discharge: 2018-09-17 | Disposition: A | Discharge status: Home / Self Care | Payer: No Typology Code available for payment source | Attending: Emergency Medicine | Admitting: Emergency Medicine

## 2018-09-17 ENCOUNTER — Encounter (HOSPITAL_COMMUNITY): Payer: Self-pay

## 2018-09-17 ENCOUNTER — Emergency Department (HOSPITAL_COMMUNITY): Payer: No Typology Code available for payment source

## 2018-09-17 ENCOUNTER — Other Ambulatory Visit: Payer: Self-pay

## 2018-09-17 DIAGNOSIS — S161XXA Strain of muscle, fascia and tendon at neck level, initial encounter: Secondary | ICD-10-CM | POA: Diagnosis not present

## 2018-09-17 DIAGNOSIS — S0990XA Unspecified injury of head, initial encounter: Secondary | ICD-10-CM | POA: Insufficient documentation

## 2018-09-17 DIAGNOSIS — M545 Low back pain: Secondary | ICD-10-CM | POA: Diagnosis not present

## 2018-09-17 DIAGNOSIS — Y999 Unspecified external cause status: Secondary | ICD-10-CM | POA: Diagnosis not present

## 2018-09-17 DIAGNOSIS — S299XXA Unspecified injury of thorax, initial encounter: Secondary | ICD-10-CM | POA: Diagnosis not present

## 2018-09-17 DIAGNOSIS — M542 Cervicalgia: Secondary | ICD-10-CM | POA: Diagnosis not present

## 2018-09-17 DIAGNOSIS — S39012A Strain of muscle, fascia and tendon of lower back, initial encounter: Secondary | ICD-10-CM | POA: Diagnosis not present

## 2018-09-17 DIAGNOSIS — Y929 Unspecified place or not applicable: Secondary | ICD-10-CM | POA: Diagnosis not present

## 2018-09-17 DIAGNOSIS — S199XXA Unspecified injury of neck, initial encounter: Secondary | ICD-10-CM | POA: Diagnosis not present

## 2018-09-17 DIAGNOSIS — Y939 Activity, unspecified: Secondary | ICD-10-CM | POA: Diagnosis not present

## 2018-09-17 DIAGNOSIS — F17210 Nicotine dependence, cigarettes, uncomplicated: Secondary | ICD-10-CM | POA: Diagnosis not present

## 2018-09-17 DIAGNOSIS — S3992XA Unspecified injury of lower back, initial encounter: Secondary | ICD-10-CM | POA: Diagnosis not present

## 2018-09-17 DIAGNOSIS — R51 Headache: Secondary | ICD-10-CM | POA: Diagnosis not present

## 2018-09-17 MED ORDER — OXYCODONE-ACETAMINOPHEN 5-325 MG PO TABS: 1.0000 | ORAL_TABLET | Freq: Four times a day (QID) | ORAL | 0 refills | Status: DC | PRN

## 2018-09-17 MED ORDER — METHOCARBAMOL 500 MG PO TABS
1000.0000 mg | ORAL_TABLET | Freq: Once | ORAL | Status: AC
  Administered 2018-09-17: 1000 mg via ORAL
  Filled 2018-09-17: qty 2

## 2018-09-17 MED ORDER — IBUPROFEN 600 MG PO TABS: 600.0000 mg | ORAL_TABLET | Freq: Four times a day (QID) | ORAL | 0 refills | Status: DC | PRN

## 2018-09-17 MED ORDER — OXYCODONE-ACETAMINOPHEN 5-325 MG PO TABS
1.0000 | ORAL_TABLET | Freq: Once | ORAL | Status: AC
  Administered 2018-09-17: 1 via ORAL
  Filled 2018-09-17: qty 1

## 2018-09-17 MED ORDER — ONDANSETRON 4 MG PO TBDP
4.0000 mg | ORAL_TABLET | Freq: Once | ORAL | Status: AC
  Administered 2018-09-17: 4 mg via ORAL
  Filled 2018-09-17: qty 1

## 2018-09-17 MED ORDER — METHOCARBAMOL 500 MG PO TABS: 1000.0000 mg | ORAL_TABLET | Freq: Three times a day (TID) | ORAL | 0 refills | Status: DC | PRN

## 2018-09-17 MED ORDER — IBUPROFEN 400 MG PO TABS
400.0000 mg | ORAL_TABLET | Freq: Once | ORAL | Status: AC
  Administered 2018-09-17: 400 mg via ORAL
  Filled 2018-09-17: qty 1

## 2018-09-17 NOTE — ED Notes (Signed)
Pt to xray and CT
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
## 2018-10-08 DIAGNOSIS — M5412 Radiculopathy, cervical region: Secondary | ICD-10-CM | POA: Diagnosis not present

## 2018-10-12 DIAGNOSIS — G894 Chronic pain syndrome: Secondary | ICD-10-CM | POA: Diagnosis not present

## 2018-10-14 DIAGNOSIS — M5412 Radiculopathy, cervical region: Secondary | ICD-10-CM | POA: Diagnosis not present

## 2018-10-14 DIAGNOSIS — M542 Cervicalgia: Secondary | ICD-10-CM | POA: Diagnosis not present

## 2018-10-15 DIAGNOSIS — M542 Cervicalgia: Secondary | ICD-10-CM | POA: Diagnosis not present

## 2018-10-22 DIAGNOSIS — G8929 Other chronic pain: Secondary | ICD-10-CM | POA: Diagnosis not present

## 2018-10-27 DIAGNOSIS — R29898 Other symptoms and signs involving the musculoskeletal system: Secondary | ICD-10-CM | POA: Diagnosis not present

## 2018-10-27 DIAGNOSIS — R200 Anesthesia of skin: Secondary | ICD-10-CM | POA: Diagnosis not present

## 2018-11-03 DIAGNOSIS — M5412 Radiculopathy, cervical region: Secondary | ICD-10-CM | POA: Diagnosis not present

## 2018-11-09 DIAGNOSIS — G8929 Other chronic pain: Secondary | ICD-10-CM | POA: Diagnosis not present

## 2018-11-09 DIAGNOSIS — M9951 Intervertebral disc stenosis of neural canal of cervical region: Secondary | ICD-10-CM | POA: Diagnosis not present

## 2018-11-09 DIAGNOSIS — M792 Neuralgia and neuritis, unspecified: Secondary | ICD-10-CM | POA: Diagnosis not present

## 2018-11-09 DIAGNOSIS — G43001 Migraine without aura, not intractable, with status migrainosus: Secondary | ICD-10-CM | POA: Diagnosis not present

## 2018-11-09 DIAGNOSIS — M9931 Osseous stenosis of neural canal of cervical region: Secondary | ICD-10-CM | POA: Diagnosis not present

## 2018-11-09 DIAGNOSIS — M48062 Spinal stenosis, lumbar region with neurogenic claudication: Secondary | ICD-10-CM | POA: Diagnosis not present

## 2018-11-09 DIAGNOSIS — G894 Chronic pain syndrome: Secondary | ICD-10-CM | POA: Diagnosis not present

## 2018-11-09 DIAGNOSIS — M47897 Other spondylosis, lumbosacral region: Secondary | ICD-10-CM | POA: Diagnosis not present

## 2018-11-09 DIAGNOSIS — G4700 Insomnia, unspecified: Secondary | ICD-10-CM | POA: Diagnosis not present

## 2018-12-07 DIAGNOSIS — G894 Chronic pain syndrome: Secondary | ICD-10-CM | POA: Diagnosis not present

## 2018-12-17 DIAGNOSIS — Q8501 Neurofibromatosis, type 1: Secondary | ICD-10-CM | POA: Diagnosis not present

## 2018-12-17 DIAGNOSIS — Z681 Body mass index (BMI) 19 or less, adult: Secondary | ICD-10-CM | POA: Diagnosis not present

## 2018-12-17 DIAGNOSIS — G894 Chronic pain syndrome: Secondary | ICD-10-CM | POA: Diagnosis not present

## 2018-12-17 DIAGNOSIS — Z1389 Encounter for screening for other disorder: Secondary | ICD-10-CM | POA: Diagnosis not present

## 2018-12-17 DIAGNOSIS — M797 Fibromyalgia: Secondary | ICD-10-CM | POA: Diagnosis not present

## 2019-01-04 DIAGNOSIS — Z5181 Encounter for therapeutic drug level monitoring: Secondary | ICD-10-CM | POA: Diagnosis not present

## 2019-01-04 DIAGNOSIS — M419 Scoliosis, unspecified: Secondary | ICD-10-CM | POA: Diagnosis not present

## 2019-01-04 DIAGNOSIS — M5030 Other cervical disc degeneration, unspecified cervical region: Secondary | ICD-10-CM | POA: Diagnosis not present

## 2019-01-04 DIAGNOSIS — G43909 Migraine, unspecified, not intractable, without status migrainosus: Secondary | ICD-10-CM | POA: Diagnosis not present

## 2019-01-04 DIAGNOSIS — G894 Chronic pain syndrome: Secondary | ICD-10-CM | POA: Diagnosis not present

## 2019-02-01 DIAGNOSIS — M419 Scoliosis, unspecified: Secondary | ICD-10-CM | POA: Diagnosis not present

## 2019-02-01 DIAGNOSIS — G43001 Migraine without aura, not intractable, with status migrainosus: Secondary | ICD-10-CM | POA: Diagnosis not present

## 2019-02-01 DIAGNOSIS — Z5181 Encounter for therapeutic drug level monitoring: Secondary | ICD-10-CM | POA: Diagnosis not present

## 2019-02-01 DIAGNOSIS — M47897 Other spondylosis, lumbosacral region: Secondary | ICD-10-CM | POA: Diagnosis not present

## 2019-02-01 DIAGNOSIS — M9931 Osseous stenosis of neural canal of cervical region: Secondary | ICD-10-CM | POA: Diagnosis not present

## 2019-02-01 DIAGNOSIS — G43909 Migraine, unspecified, not intractable, without status migrainosus: Secondary | ICD-10-CM | POA: Diagnosis not present

## 2019-02-01 DIAGNOSIS — G894 Chronic pain syndrome: Secondary | ICD-10-CM | POA: Diagnosis not present

## 2019-02-01 DIAGNOSIS — M9951 Intervertebral disc stenosis of neural canal of cervical region: Secondary | ICD-10-CM | POA: Diagnosis not present

## 2019-02-01 DIAGNOSIS — M79609 Pain in unspecified limb: Secondary | ICD-10-CM | POA: Diagnosis not present

## 2019-02-01 DIAGNOSIS — M792 Neuralgia and neuritis, unspecified: Secondary | ICD-10-CM | POA: Diagnosis not present

## 2019-02-01 DIAGNOSIS — M48062 Spinal stenosis, lumbar region with neurogenic claudication: Secondary | ICD-10-CM | POA: Diagnosis not present

## 2019-02-01 DIAGNOSIS — G8929 Other chronic pain: Secondary | ICD-10-CM | POA: Diagnosis not present

## 2019-02-01 DIAGNOSIS — M5030 Other cervical disc degeneration, unspecified cervical region: Secondary | ICD-10-CM | POA: Diagnosis not present

## 2019-03-02 DIAGNOSIS — G894 Chronic pain syndrome: Secondary | ICD-10-CM | POA: Diagnosis not present

## 2019-03-09 DIAGNOSIS — G894 Chronic pain syndrome: Secondary | ICD-10-CM | POA: Diagnosis not present

## 2019-03-12 DIAGNOSIS — G43909 Migraine, unspecified, not intractable, without status migrainosus: Secondary | ICD-10-CM | POA: Diagnosis not present

## 2019-03-12 DIAGNOSIS — J069 Acute upper respiratory infection, unspecified: Secondary | ICD-10-CM | POA: Diagnosis not present

## 2019-03-12 DIAGNOSIS — Z681 Body mass index (BMI) 19 or less, adult: Secondary | ICD-10-CM | POA: Diagnosis not present

## 2019-04-06 DIAGNOSIS — G894 Chronic pain syndrome: Secondary | ICD-10-CM | POA: Diagnosis not present

## 2019-05-04 DIAGNOSIS — G894 Chronic pain syndrome: Secondary | ICD-10-CM | POA: Diagnosis not present

## 2019-05-04 DIAGNOSIS — M5030 Other cervical disc degeneration, unspecified cervical region: Secondary | ICD-10-CM | POA: Diagnosis not present

## 2019-05-04 DIAGNOSIS — M5481 Occipital neuralgia: Secondary | ICD-10-CM | POA: Diagnosis not present

## 2019-05-04 DIAGNOSIS — M4606 Spinal enthesopathy, lumbar region: Secondary | ICD-10-CM | POA: Diagnosis not present

## 2019-05-07 ENCOUNTER — Emergency Department (HOSPITAL_COMMUNITY)
Admission: EM | Admit: 2019-05-07 | Discharge: 2019-05-07 | Disposition: A | Discharge status: Home / Self Care | Payer: Medicare Other | Attending: Emergency Medicine | Admitting: Emergency Medicine

## 2019-05-07 ENCOUNTER — Encounter (HOSPITAL_COMMUNITY): Payer: Self-pay | Admitting: Emergency Medicine

## 2019-05-07 ENCOUNTER — Emergency Department (HOSPITAL_COMMUNITY): Payer: Medicare Other

## 2019-05-07 ENCOUNTER — Other Ambulatory Visit: Payer: Self-pay

## 2019-05-07 DIAGNOSIS — G40309 Generalized idiopathic epilepsy and epileptic syndromes, not intractable, without status epilepticus: Secondary | ICD-10-CM | POA: Diagnosis not present

## 2019-05-07 DIAGNOSIS — R569 Unspecified convulsions: Secondary | ICD-10-CM | POA: Diagnosis not present

## 2019-05-07 DIAGNOSIS — R000 Tachycardia, unspecified: Secondary | ICD-10-CM | POA: Diagnosis not present

## 2019-05-07 DIAGNOSIS — G40909 Epilepsy, unspecified, not intractable, without status epilepticus: Secondary | ICD-10-CM | POA: Diagnosis not present

## 2019-05-07 DIAGNOSIS — Z79899 Other long term (current) drug therapy: Secondary | ICD-10-CM | POA: Insufficient documentation

## 2019-05-07 LAB — COMPREHENSIVE METABOLIC PANEL
ALT: 207 U/L — ABNORMAL HIGH (ref 0–44)
AST: 303 U/L — ABNORMAL HIGH (ref 15–41)
Albumin: 4.5 g/dL (ref 3.5–5.0)
Alkaline Phosphatase: 341 U/L — ABNORMAL HIGH (ref 38–126)
Anion gap: 9 (ref 5–15)
BUN: 29 mg/dL — ABNORMAL HIGH (ref 6–20)
CO2: 26 mmol/L (ref 22–32)
Calcium: 9.2 mg/dL (ref 8.9–10.3)
Chloride: 103 mmol/L (ref 98–111)
Creatinine, Ser: 0.77 mg/dL (ref 0.44–1.00)
GFR calc Af Amer: >60 mL/min (ref >60)
GFR calc non Af Amer: >60 mL/min (ref >60)
Glucose, Bld: 91 mg/dL (ref 70–99)
Potassium: 4.9 mmol/L (ref 3.5–5.1)
Sodium: 138 mmol/L (ref 135–145)
Total Bilirubin: 0.9 mg/dL (ref 0.3–1.2)
Total Protein: 7.5 g/dL (ref 6.5–8.1)

## 2019-05-07 LAB — CBC WITH DIFFERENTIAL/PLATELET
Abs Immature Granulocytes: 0.03 10*3/uL (ref 0.00–0.07)
Basophils Absolute: 0.1 10*3/uL (ref 0.0–0.1)
Basophils Relative: 1 %
Eosinophils Absolute: 0.1 10*3/uL (ref 0.0–0.5)
Eosinophils Relative: 2 %
HCT: 46.2 % — ABNORMAL HIGH (ref 36.0–46.0)
Hemoglobin: 15 g/dL (ref 12.0–15.0)
Immature Granulocytes: 0 %
Lymphocytes Relative: 17 %
Lymphs Abs: 1.3 10*3/uL (ref 0.7–4.0)
MCH: 31.8 pg (ref 26.0–34.0)
MCHC: 32.5 g/dL (ref 30.0–36.0)
MCV: 98.1 fL (ref 80.0–100.0)
Monocytes Absolute: 0.7 10*3/uL (ref 0.1–1.0)
Monocytes Relative: 9 %
Neutro Abs: 5.5 10*3/uL (ref 1.7–7.7)
Neutrophils Relative %: 71 %
Platelets: 278 10*3/uL (ref 150–400)
RBC: 4.71 MIL/uL (ref 3.87–5.11)
RDW: 12.9 % (ref 11.5–15.5)
WBC: 7.7 10*3/uL (ref 4.0–10.5)
nRBC: 0 % (ref 0.0–0.2)

## 2019-05-07 LAB — RAPID URINE DRUG SCREEN, HOSP PERFORMED
Amphetamines: NOT DETECTED
Barbiturates: NOT DETECTED
Benzodiazepines: NOT DETECTED
Cocaine: NOT DETECTED
Opiates: POSITIVE — AB
Tetrahydrocannabinol: NOT DETECTED

## 2019-05-07 LAB — ETHANOL: Alcohol, Ethyl (B): <10 mg/dL (ref <10)

## 2019-05-07 MED ORDER — LEVETIRACETAM 500 MG PO TABS: 500.0000 mg | ORAL_TABLET | Freq: Two times a day (BID) | ORAL | 1 refills | Status: DC

## 2019-05-07 MED ORDER — IBUPROFEN 400 MG PO TABS
400.0000 mg | ORAL_TABLET | Freq: Once | ORAL | Status: AC
  Administered 2019-05-07: 400 mg via ORAL
  Filled 2019-05-07: qty 1

## 2019-05-07 MED ORDER — ACETAMINOPHEN 500 MG PO TABS
1000.0000 mg | ORAL_TABLET | Freq: Once | ORAL | Status: AC
  Administered 2019-05-07: 1000 mg via ORAL
  Filled 2019-05-07: qty 2

## 2019-05-07 NOTE — Discharge Instructions (Addendum)
I was unable to give you a tablet here.  Prescription for Keppra 500 mg twice a day sent to your pharmacy.  Recommend follow-up with neurologist.  Phone number given.

## 2019-05-07 NOTE — ED Provider Notes (Signed)
[facility] EMERGENCY DEPARTMENT Provider Note   CSN: [id number] Arrival date & time: 05/07/19  1132     History   Chief Complaint Chief Complaint  Patient presents with  . Seizures    HPI Sharon Welch is a 48 y.o. female.     Status post "seizure" on Wednesday night described as a "tonic-clonic seizure" with a postictal phase.  Patient allegedly has a history of seizures, but they had never been captured on EEG.  She has been seen by a neurologist in the past at [facility].  Past medication history includes Keppra, but none in the last 3 years.  Review of systems positive for headache.  No new neurological deficits, stiff neck, facial asymmetry, arm or leg weakness.  She has been seizure-free the past 2 days.  Nothing makes symptoms better or worse.     Past Medical History:  Diagnosis Date  . Bruising, spontaneous 12/08/2013  . Chronic back pain   . Chronic back pain   . DDD (degenerative disc disease), lumbar   . DDD (degenerative disc disease), lumbar   . Fibromyalgia   . Headache(784.0)   . Ovarian cyst   . Partial tear subscapularis tendon 12/27/2011  . Scoliosis   . Seizures ([place])    started 9/12-    Patient Active Problem List   Diagnosis Date Noted  . Migraine headache 09/25/2015  . 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 Bilateral occipital neuralgia 01/02/2015  . Migraine 01/02/2015  . 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 DDD (degenerative disc disease), cervical 10/27/2014  . 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 DDD (degenerative disc disease), lumbosacral 10/27/2014  . Partial tear subscapularis tendon 12/27/2011    Past Surgical History:  Procedure Laterality Date  . ABDOMINAL HYSTERECTOMY  2012  . BACK SURGERY  1989   scoliosis throsic-rods  . CESAREAN SECTION    . CHOLECYSTECTOMY N/A 11/17/2012   Procedure: LAPAROSCOPIC CHOLECYSTECTOMY WITH INTRAOPERATIVE CHOLANGIOGRAM;  Surgeon: Ralene Ok, MD;  Location:  [place];  Service: General;  Laterality: N/A;  . ERCP N/A 05/07/2013   Procedure: ENDOSCOPIC RETROGRADE CHOLANGIOPANCREATOGRAPHY (ERCP);  Surgeon: Beryle Beams, MD;  Location: [facility];  Service: Endoscopy;  Laterality: N/A;  start ercp first, if canulation fails switch to EUS   . EUS N/A 11/03/2012   Procedure: UPPER ENDOSCOPIC ULTRASOUND (EUS) LINEAR;  Surgeon: Beryle Beams, MD;  Location: [facility];  Service: Endoscopy;  Laterality: N/A;  . left arm    . NECK SURGERY    . TUBAL LIGATION       OB History    Gravida      Para      Term      Preterm      AB      Living  2     SAB      TAB      Ectopic      Multiple      Live Births               Home Medications    Prior to Admission medications   Medication Sig Start Date End Date Taking? Authorizing Provider  HYDROcodone-acetaminophen (NORCO) 10-325 MG tablet Take 1 tablet by mouth every 4 (four) hours as needed for moderate pain or severe pain.   Yes [provider]  Milnacipran (SAVELLA) 50 MG TABS tablet Take 50 mg by mouth 2 (two) times daily.   Yes [provider]  tiZANidine (ZANAFLEX)  2 MG tablet Take 2 mg by mouth every 6 (six) hours as needed for muscle spasms.   Yes [provider]  ibuprofen (ADVIL,MOTRIN) 600 MG tablet Take 1 tablet (600 mg total) by mouth every 6 (six) hours as needed. Patient not taking: Reported on 05/07/2019 09/17/18   Julianne Rice, MD  levETIRAcetam (KEPPRA) 500 MG tablet Take 1 tablet (500 mg total) by mouth 2 (two) times daily. 05/07/19   Nat Christen, MD  methocarbamol (ROBAXIN) 500 MG tablet Take 2 tablets (1,000 mg total) by mouth every 8 (eight) hours as needed for muscle spasms. Patient not taking: Reported on 05/07/2019 09/17/18   Julianne Rice, MD  oxyCODONE-acetaminophen (PERCOCET) 5-325 MG tablet Take 1 tablet by mouth every 6 (six) hours as needed for severe pain. Patient not taking: Reported on 05/07/2019 09/17/18   Julianne Rice, MD    Family History Family History  Problem Relation Age of Onset  . Hypertension Mother   . Hypertension Father   . Cancer Father        prostate  . Heart disease Father   . Hypertension Brother   . Hypertension Brother     Social History Social History   Tobacco Use  . Smoking status: Current Every Day Smoker    Packs/day: 0.50    Years: 20.00    Pack years: 10.00    Types: Cigarettes  . Smokeless tobacco: Never Used  Substance Use Topics  . Alcohol use: No    Alcohol/week: 0.0 standard drinks    Comment: 1-2 sips a years  . Drug use: No     Allergies   Tramadol   Review of Systems Review of Systems  All other systems reviewed and are negative.    Physical Exam Updated Vital Signs BP (!) 143/86   Pulse 93   Temp 98.5 F (36.9 C) (Oral)   Resp 13   Ht 5' 0.5" (1.537 m)   Wt 42.4 kg   SpO2 95%   BMI 17.94 kg/m   Physical Exam Vitals signs and nursing note reviewed.  Constitutional:      Appearance: She is well-developed.  HENT:     Head: Normocephalic and atraumatic.  Eyes:     Conjunctiva/sclera: Conjunctivae normal.  Neck:     Musculoskeletal: Neck supple.  Cardiovascular:     Rate and Rhythm: Normal rate and regular rhythm.  Pulmonary:     Effort: Pulmonary effort is normal.     Breath sounds: Normal breath sounds.  Abdominal:     General: Bowel sounds are normal.     Palpations: Abdomen is soft.  Musculoskeletal: Normal range of motion.  Skin:    General: Skin is warm and dry.  Neurological:     General: No focal deficit present.     Mental Status: She is alert and oriented to person, place, and time.  Psychiatric:        Behavior: Behavior normal.      ED Treatments / Results  Labs (all labs ordered are listed, but only abnormal results are displayed) Labs Reviewed  CBC WITH DIFFERENTIAL/PLATELET - Abnormal; Notable for the following components:      Result Value   HCT 46.2 (*)    All other components within  normal limits  COMPREHENSIVE METABOLIC PANEL - Abnormal; Notable for the following components:   BUN 29 (*)    AST 303 (*)    ALT 207 (*)    Alkaline Phosphatase 341 (*)    All other  components within normal limits  RAPID URINE DRUG SCREEN, HOSP PERFORMED - Abnormal; Notable for the following components:   Opiates POSITIVE (*)    All other components within normal limits  ETHANOL    EKG EKG Interpretation  Date/Time:  Friday May 07 2019 11:52:13 EST Ventricular Rate:  113 PR Interval:    QRS Duration: 89 QT Interval:  326 QTC Calculation: 447 R Axis:   -70 Text Interpretation: Sinus tachycardia Right atrial enlargement Consider right ventricular hypertrophy Probable inferior infarct, old Consider anterior infarct Baseline wander in lead(s) I III aVL aVF V2 Confirmed by Nat Christen [phone]) on 05/07/2019 1:14:21 PM   Radiology Ct Head Wo Contrast  Result Date: 05/07/2019 CLINICAL DATA:  Seizure.  Encephalopathy. EXAM: CT HEAD WITHOUT CONTRAST TECHNIQUE: Contiguous axial images were obtained from the base of the skull through the vertex without intravenous contrast. COMPARISON:  March 07, 2017 FINDINGS: Brain: No evidence of acute infarction, hemorrhage, hydrocephalus, extra-axial collection or mass lesion/mass effect. Vascular: No hyperdense vessel noted. Skull: Normal. Negative for fracture or focal lesion. Sinuses/Orbits: No acute finding. Other: None. IMPRESSION: No focal acute intracranial abnormality identified. Electronically Signed   By: Abelardo Diesel M.D.   On: 05/07/2019 14:30    Procedures Procedures (including critical care time)  Medications Ordered in ED Medications  acetaminophen (TYLENOL) tablet 1,000 mg (1,000 mg Oral Given 05/07/19 1308)  ibuprofen (ADVIL) tablet 400 mg (400 mg Oral Given 05/07/19 1308)     Initial Impression / Assessment and Plan / ED Course  I have reviewed the triage vital signs and the nursing notes.  Pertinent labs & imaging  results that were available during my care of the patient were reviewed by me and considered in my medical decision making (see chart for details).       Physical exam here are normal.  CT negative.  Discussed findings with neurologist on call.  Will start Keppra 500 mg twice a day.  Patient will follow up with neurology.   Final Clinical Impressions(s) / ED Diagnoses   Final diagnoses:  Seizure-like activity [facility])    ED Discharge Orders         Ordered    levETIRAcetam (KEPPRA) 500 MG tablet  2 times daily     05/07/19 1522           Nat Christen, MD 05/07/19 1526

## 2019-05-07 NOTE — ED Notes (Signed)
Pt transported to CT ?

## 2019-05-07 NOTE — ED Notes (Signed)
Lab at bedside

## 2019-05-07 NOTE — ED Triage Notes (Signed)
PT states she had a seizure Wednesday night and hasn't had a full seizure in years. PT states she was taken off of Keppra x3 years ago and only has some focal seizures from time to time but none have ever been captured on EEGs. PT states she has had a headache since she fell and had seizure Wednesday evening. PT states she had a tele-visit with her PCP (Dr. Hilma Favors) this am and was told to come have eval in the ED today.

## 2019-05-14 ENCOUNTER — Encounter (HOSPITAL_COMMUNITY): Payer: Self-pay | Admitting: Emergency Medicine

## 2019-05-14 ENCOUNTER — Other Ambulatory Visit: Payer: Self-pay

## 2019-05-14 ENCOUNTER — Inpatient Hospital Stay (HOSPITAL_COMMUNITY)
Admission: EM | Admit: 2019-05-14 | Discharge: 2019-05-18 | DRG: 378 | Disposition: A | Discharge status: Home / Self Care | Payer: Medicare Other | Attending: Family Medicine | Admitting: Family Medicine

## 2019-05-14 DIAGNOSIS — Z8719 Personal history of other diseases of the digestive system: Secondary | ICD-10-CM

## 2019-05-14 DIAGNOSIS — E872 Acidosis: Secondary | ICD-10-CM | POA: Diagnosis not present

## 2019-05-14 DIAGNOSIS — R739 Hyperglycemia, unspecified: Secondary | ICD-10-CM | POA: Diagnosis present

## 2019-05-14 DIAGNOSIS — F17210 Nicotine dependence, cigarettes, uncomplicated: Secondary | ICD-10-CM | POA: Diagnosis present

## 2019-05-14 DIAGNOSIS — Z681 Body mass index (BMI) 19 or less, adult: Secondary | ICD-10-CM | POA: Diagnosis not present

## 2019-05-14 DIAGNOSIS — K920 Hematemesis: Secondary | ICD-10-CM

## 2019-05-14 DIAGNOSIS — T39315A Adverse effect of propionic acid derivatives, initial encounter: Secondary | ICD-10-CM | POA: Diagnosis present

## 2019-05-14 DIAGNOSIS — D62 Acute posthemorrhagic anemia: Secondary | ICD-10-CM | POA: Diagnosis present

## 2019-05-14 DIAGNOSIS — Z20828 Contact with and (suspected) exposure to other viral communicable diseases: Secondary | ICD-10-CM | POA: Diagnosis present

## 2019-05-14 DIAGNOSIS — E876 Hypokalemia: Secondary | ICD-10-CM | POA: Diagnosis present

## 2019-05-14 DIAGNOSIS — M797 Fibromyalgia: Secondary | ICD-10-CM | POA: Diagnosis present

## 2019-05-14 DIAGNOSIS — E860 Dehydration: Secondary | ICD-10-CM

## 2019-05-14 DIAGNOSIS — Z8042 Family history of malignant neoplasm of prostate: Secondary | ICD-10-CM

## 2019-05-14 DIAGNOSIS — E46 Unspecified protein-calorie malnutrition: Secondary | ICD-10-CM | POA: Diagnosis present

## 2019-05-14 DIAGNOSIS — K589 Irritable bowel syndrome without diarrhea: Secondary | ICD-10-CM | POA: Diagnosis present

## 2019-05-14 DIAGNOSIS — I444 Left anterior fascicular block: Secondary | ICD-10-CM | POA: Diagnosis present

## 2019-05-14 DIAGNOSIS — R52 Pain, unspecified: Secondary | ICD-10-CM

## 2019-05-14 DIAGNOSIS — Z9049 Acquired absence of other specified parts of digestive tract: Secondary | ICD-10-CM

## 2019-05-14 DIAGNOSIS — Z03818 Encounter for observation for suspected exposure to other biological agents ruled out: Secondary | ICD-10-CM | POA: Diagnosis not present

## 2019-05-14 DIAGNOSIS — G894 Chronic pain syndrome: Secondary | ICD-10-CM | POA: Diagnosis present

## 2019-05-14 DIAGNOSIS — K254 Chronic or unspecified gastric ulcer with hemorrhage: Secondary | ICD-10-CM | POA: Diagnosis not present

## 2019-05-14 DIAGNOSIS — Z8249 Family history of ischemic heart disease and other diseases of the circulatory system: Secondary | ICD-10-CM

## 2019-05-14 DIAGNOSIS — K269 Duodenal ulcer, unspecified as acute or chronic, without hemorrhage or perforation: Secondary | ICD-10-CM | POA: Diagnosis present

## 2019-05-14 DIAGNOSIS — R569 Unspecified convulsions: Secondary | ICD-10-CM | POA: Diagnosis present

## 2019-05-14 DIAGNOSIS — K922 Gastrointestinal hemorrhage, unspecified: Secondary | ICD-10-CM | POA: Diagnosis present

## 2019-05-14 DIAGNOSIS — K279 Peptic ulcer, site unspecified, unspecified as acute or chronic, without hemorrhage or perforation: Secondary | ICD-10-CM | POA: Diagnosis present

## 2019-05-14 DIAGNOSIS — R000 Tachycardia, unspecified: Secondary | ICD-10-CM | POA: Diagnosis not present

## 2019-05-14 DIAGNOSIS — K2971 Gastritis, unspecified, with bleeding: Secondary | ICD-10-CM | POA: Diagnosis present

## 2019-05-14 DIAGNOSIS — Z90710 Acquired absence of both cervix and uterus: Secondary | ICD-10-CM

## 2019-05-14 DIAGNOSIS — G43909 Migraine, unspecified, not intractable, without status migrainosus: Secondary | ICD-10-CM | POA: Diagnosis present

## 2019-05-14 DIAGNOSIS — R945 Abnormal results of liver function studies: Secondary | ICD-10-CM | POA: Diagnosis present

## 2019-05-14 DIAGNOSIS — Z79899 Other long term (current) drug therapy: Secondary | ICD-10-CM

## 2019-05-14 LAB — CBC
HCT: 40 % (ref 36.0–46.0)
Hemoglobin: 13.3 g/dL (ref 12.0–15.0)
MCH: 32.4 pg (ref 26.0–34.0)
MCHC: 33.3 g/dL (ref 30.0–36.0)
MCV: 97.3 fL (ref 80.0–100.0)
Platelets: 455 10*3/uL — ABNORMAL HIGH (ref 150–400)
RBC: 4.11 MIL/uL (ref 3.87–5.11)
RDW: 12.7 % (ref 11.5–15.5)
WBC: 19.6 10*3/uL — ABNORMAL HIGH (ref 4.0–10.5)
nRBC: 0 % (ref 0.0–0.2)

## 2019-05-14 LAB — COMPREHENSIVE METABOLIC PANEL
ALT: 133 U/L — ABNORMAL HIGH (ref 0–44)
AST: 61 U/L — ABNORMAL HIGH (ref 15–41)
Albumin: 4 g/dL (ref 3.5–5.0)
Alkaline Phosphatase: 549 U/L — ABNORMAL HIGH (ref 38–126)
Anion gap: 11 (ref 5–15)
BUN: 49 mg/dL — ABNORMAL HIGH (ref 6–20)
CO2: 22 mmol/L (ref 22–32)
Calcium: 9 mg/dL (ref 8.9–10.3)
Chloride: 106 mmol/L (ref 98–111)
Creatinine, Ser: 0.76 mg/dL (ref 0.44–1.00)
GFR calc Af Amer: >60 mL/min (ref >60)
GFR calc non Af Amer: >60 mL/min (ref >60)
Glucose, Bld: 134 mg/dL — ABNORMAL HIGH (ref 70–99)
Potassium: 4.3 mmol/L (ref 3.5–5.1)
Sodium: 139 mmol/L (ref 135–145)
Total Bilirubin: 0.6 mg/dL (ref 0.3–1.2)
Total Protein: 7.2 g/dL (ref 6.5–8.1)

## 2019-05-14 LAB — PROTIME-INR
INR: 1 (ref 0.8–1.2)
Prothrombin Time: 13 seconds (ref 11.4–15.2)

## 2019-05-14 LAB — LIPASE, BLOOD: Lipase: 19 U/L (ref 11–51)

## 2019-05-14 LAB — POC OCCULT BLOOD, ED: Fecal Occult Bld: POSITIVE — AB

## 2019-05-14 MED ORDER — FENTANYL CITRATE (PF) 100 MCG/2ML IJ SOLN
50.0000 ug | Freq: Once | INTRAMUSCULAR | Status: AC
  Administered 2019-05-14: 50 ug via INTRAVENOUS
  Filled 2019-05-14: qty 2

## 2019-05-14 MED ORDER — PANTOPRAZOLE SODIUM 40 MG IV SOLR
40.0000 mg | Freq: Once | INTRAVENOUS | Status: AC
  Administered 2019-05-14: 40 mg via INTRAVENOUS
  Filled 2019-05-14: qty 40

## 2019-05-14 MED ORDER — SODIUM CHLORIDE 0.9 % IV BOLUS (SEPSIS)
1000.0000 mL | Freq: Once | INTRAVENOUS | Status: AC
  Administered 2019-05-14: 1000 mL via INTRAVENOUS

## 2019-05-14 MED ORDER — ONDANSETRON HCL 4 MG/2ML IJ SOLN
4.0000 mg | Freq: Once | INTRAMUSCULAR | Status: AC
  Administered 2019-05-14: 4 mg via INTRAVENOUS
  Filled 2019-05-14: qty 2

## 2019-05-14 MED ORDER — SODIUM CHLORIDE 0.9% FLUSH
3.0000 mL | Freq: Once | INTRAVENOUS | Status: AC
  Administered 2019-05-14: 3 mL via INTRAVENOUS

## 2019-05-14 NOTE — ED Notes (Signed)
Patient had 2 large bowel movements. Stool dark in color.

## 2019-05-14 NOTE — ED Triage Notes (Signed)
Patient states she has been vomiting blood, started today. Tachycardic in Triage. Patient hyperventilating when told rooms are full. Explained to patient current situation with room availability. HR decreasing as patient calms. ECG in Triage.

## 2019-05-15 ENCOUNTER — Emergency Department (HOSPITAL_COMMUNITY): Payer: Medicare Other

## 2019-05-15 ENCOUNTER — Inpatient Hospital Stay (HOSPITAL_COMMUNITY): Payer: Medicare Other | Admitting: Anesthesiology

## 2019-05-15 ENCOUNTER — Encounter (HOSPITAL_COMMUNITY)
Admission: EM | Disposition: A | Discharge status: Home / Self Care | Payer: Self-pay | Source: Home / Self Care | Attending: Family Medicine

## 2019-05-15 ENCOUNTER — Encounter (HOSPITAL_COMMUNITY): Payer: Self-pay | Admitting: Emergency Medicine

## 2019-05-15 DIAGNOSIS — G894 Chronic pain syndrome: Secondary | ICD-10-CM | POA: Diagnosis present

## 2019-05-15 DIAGNOSIS — K279 Peptic ulcer, site unspecified, unspecified as acute or chronic, without hemorrhage or perforation: Secondary | ICD-10-CM | POA: Diagnosis present

## 2019-05-15 DIAGNOSIS — I444 Left anterior fascicular block: Secondary | ICD-10-CM | POA: Diagnosis present

## 2019-05-15 DIAGNOSIS — Z20828 Contact with and (suspected) exposure to other viral communicable diseases: Secondary | ICD-10-CM | POA: Diagnosis present

## 2019-05-15 DIAGNOSIS — K529 Noninfective gastroenteritis and colitis, unspecified: Secondary | ICD-10-CM | POA: Diagnosis not present

## 2019-05-15 DIAGNOSIS — E860 Dehydration: Secondary | ICD-10-CM | POA: Diagnosis present

## 2019-05-15 DIAGNOSIS — R748 Abnormal levels of other serum enzymes: Secondary | ICD-10-CM

## 2019-05-15 DIAGNOSIS — E872 Acidosis: Secondary | ICD-10-CM | POA: Diagnosis present

## 2019-05-15 DIAGNOSIS — K920 Hematemesis: Secondary | ICD-10-CM

## 2019-05-15 DIAGNOSIS — K589 Irritable bowel syndrome without diarrhea: Secondary | ICD-10-CM | POA: Diagnosis present

## 2019-05-15 DIAGNOSIS — E876 Hypokalemia: Secondary | ICD-10-CM | POA: Diagnosis present

## 2019-05-15 DIAGNOSIS — K254 Chronic or unspecified gastric ulcer with hemorrhage: Secondary | ICD-10-CM | POA: Diagnosis present

## 2019-05-15 DIAGNOSIS — K922 Gastrointestinal hemorrhage, unspecified: Secondary | ICD-10-CM | POA: Diagnosis not present

## 2019-05-15 DIAGNOSIS — G43909 Migraine, unspecified, not intractable, without status migrainosus: Secondary | ICD-10-CM | POA: Diagnosis present

## 2019-05-15 DIAGNOSIS — K259 Gastric ulcer, unspecified as acute or chronic, without hemorrhage or perforation: Secondary | ICD-10-CM

## 2019-05-15 DIAGNOSIS — K3189 Other diseases of stomach and duodenum: Secondary | ICD-10-CM | POA: Diagnosis not present

## 2019-05-15 DIAGNOSIS — K269 Duodenal ulcer, unspecified as acute or chronic, without hemorrhage or perforation: Secondary | ICD-10-CM | POA: Diagnosis present

## 2019-05-15 DIAGNOSIS — Z79899 Other long term (current) drug therapy: Secondary | ICD-10-CM | POA: Diagnosis not present

## 2019-05-15 DIAGNOSIS — F17210 Nicotine dependence, cigarettes, uncomplicated: Secondary | ICD-10-CM | POA: Diagnosis present

## 2019-05-15 DIAGNOSIS — D62 Acute posthemorrhagic anemia: Secondary | ICD-10-CM | POA: Diagnosis present

## 2019-05-15 DIAGNOSIS — R945 Abnormal results of liver function studies: Secondary | ICD-10-CM | POA: Diagnosis present

## 2019-05-15 DIAGNOSIS — M797 Fibromyalgia: Secondary | ICD-10-CM | POA: Diagnosis present

## 2019-05-15 DIAGNOSIS — R569 Unspecified convulsions: Secondary | ICD-10-CM | POA: Diagnosis present

## 2019-05-15 DIAGNOSIS — K2950 Unspecified chronic gastritis without bleeding: Secondary | ICD-10-CM | POA: Diagnosis not present

## 2019-05-15 DIAGNOSIS — Z8249 Family history of ischemic heart disease and other diseases of the circulatory system: Secondary | ICD-10-CM | POA: Diagnosis not present

## 2019-05-15 DIAGNOSIS — E46 Unspecified protein-calorie malnutrition: Secondary | ICD-10-CM | POA: Diagnosis present

## 2019-05-15 DIAGNOSIS — K2971 Gastritis, unspecified, with bleeding: Secondary | ICD-10-CM | POA: Diagnosis present

## 2019-05-15 DIAGNOSIS — R739 Hyperglycemia, unspecified: Secondary | ICD-10-CM | POA: Diagnosis present

## 2019-05-15 DIAGNOSIS — Z681 Body mass index (BMI) 19 or less, adult: Secondary | ICD-10-CM | POA: Diagnosis not present

## 2019-05-15 DIAGNOSIS — Z8042 Family history of malignant neoplasm of prostate: Secondary | ICD-10-CM | POA: Diagnosis not present

## 2019-05-15 HISTORY — PX: ESOPHAGOGASTRODUODENOSCOPY (EGD) WITH PROPOFOL: SHX5813

## 2019-05-15 LAB — SARS CORONAVIRUS 2 BY RT PCR (HOSPITAL ORDER, PERFORMED IN ~~LOC~~ HOSPITAL LAB): SARS Coronavirus 2: NEGATIVE

## 2019-05-15 LAB — CBC
HCT: 30 % — ABNORMAL LOW (ref 36.0–46.0)
HCT: 33.8 % — ABNORMAL LOW (ref 36.0–46.0)
HCT: 37.2 % (ref 36.0–46.0)
Hemoglobin: 10.9 g/dL — ABNORMAL LOW (ref 12.0–15.0)
Hemoglobin: 12.7 g/dL (ref 12.0–15.0)
Hemoglobin: 9.6 g/dL — ABNORMAL LOW (ref 12.0–15.0)
MCH: 32.2 pg (ref 26.0–34.0)
MCH: 32.2 pg (ref 26.0–34.0)
MCH: 32.6 pg (ref 26.0–34.0)
MCHC: 32 g/dL (ref 30.0–36.0)
MCHC: 32.2 g/dL (ref 30.0–36.0)
MCHC: 34.1 g/dL (ref 30.0–36.0)
MCV: 100.7 fL — ABNORMAL HIGH (ref 80.0–100.0)
MCV: 101.2 fL — ABNORMAL HIGH (ref 80.0–100.0)
MCV: 94.2 fL (ref 80.0–100.0)
Platelets: 296 10*3/uL (ref 150–400)
Platelets: 308 10*3/uL (ref 150–400)
Platelets: 366 10*3/uL (ref 150–400)
RBC: 2.98 MIL/uL — ABNORMAL LOW (ref 3.87–5.11)
RBC: 3.34 MIL/uL — ABNORMAL LOW (ref 3.87–5.11)
RBC: 3.95 MIL/uL (ref 3.87–5.11)
RDW: 12.5 % (ref 11.5–15.5)
RDW: 12.9 % (ref 11.5–15.5)
RDW: 12.9 % (ref 11.5–15.5)
WBC: 16.5 10*3/uL — ABNORMAL HIGH (ref 4.0–10.5)
WBC: 20.4 10*3/uL — ABNORMAL HIGH (ref 4.0–10.5)
WBC: 26 10*3/uL — ABNORMAL HIGH (ref 4.0–10.5)
nRBC: 0 % (ref 0.0–0.2)
nRBC: 0 % (ref 0.0–0.2)
nRBC: 0 % (ref 0.0–0.2)

## 2019-05-15 LAB — HIV ANTIBODY (ROUTINE TESTING W REFLEX): HIV Screen 4th Generation wRfx: NONREACTIVE

## 2019-05-15 LAB — RENAL FUNCTION PANEL
Albumin: 3.2 g/dL — ABNORMAL LOW (ref 3.5–5.0)
Anion gap: 8 (ref 5–15)
BUN: 18 mg/dL (ref 6–20)
CO2: 19 mmol/L — ABNORMAL LOW (ref 22–32)
Calcium: 7.8 mg/dL — ABNORMAL LOW (ref 8.9–10.3)
Chloride: 113 mmol/L — ABNORMAL HIGH (ref 98–111)
Creatinine, Ser: 0.61 mg/dL (ref 0.44–1.00)
GFR calc Af Amer: >60 mL/min (ref >60)
GFR calc non Af Amer: >60 mL/min (ref >60)
Glucose, Bld: 98 mg/dL (ref 70–99)
Phosphorus: 2.2 mg/dL — ABNORMAL LOW (ref 2.5–4.6)
Potassium: 4.4 mmol/L (ref 3.5–5.1)
Sodium: 140 mmol/L (ref 135–145)

## 2019-05-15 LAB — COMPREHENSIVE METABOLIC PANEL
ALT: 71 U/L — ABNORMAL HIGH (ref 0–44)
AST: 29 U/L (ref 15–41)
Albumin: 2.5 g/dL — ABNORMAL LOW (ref 3.5–5.0)
Alkaline Phosphatase: 301 U/L — ABNORMAL HIGH (ref 38–126)
Anion gap: 4 — ABNORMAL LOW (ref 5–15)
BUN: 30 mg/dL — ABNORMAL HIGH (ref 6–20)
CO2: 15 mmol/L — ABNORMAL LOW (ref 22–32)
Calcium: 5.9 mg/dL — CL (ref 8.9–10.3)
Chloride: 120 mmol/L — ABNORMAL HIGH (ref 98–111)
Creatinine, Ser: 0.49 mg/dL (ref 0.44–1.00)
GFR calc Af Amer: >60 mL/min (ref >60)
GFR calc non Af Amer: >60 mL/min (ref >60)
Glucose, Bld: 76 mg/dL (ref 70–99)
Potassium: 3.3 mmol/L — ABNORMAL LOW (ref 3.5–5.1)
Sodium: 139 mmol/L (ref 135–145)
Total Bilirubin: 0.6 mg/dL (ref 0.3–1.2)
Total Protein: 4.5 g/dL — ABNORMAL LOW (ref 6.5–8.1)

## 2019-05-15 LAB — HEPATITIS PANEL, ACUTE
HCV Ab: NONREACTIVE
Hep A IgM: NONREACTIVE
Hep B C IgM: NONREACTIVE
Hepatitis B Surface Ag: NONREACTIVE

## 2019-05-15 LAB — LACTIC ACID, PLASMA
Lactic Acid, Venous: 4 mmol/L (ref 0.5–1.9)
Lactic Acid, Venous: 1.5 mmol/L (ref 0.5–1.9)

## 2019-05-15 LAB — HEMOGLOBIN A1C
Hgb A1c MFr Bld: 5.2 % (ref 4.8–5.6)
Mean Plasma Glucose: 102.54 mg/dL

## 2019-05-15 LAB — MAGNESIUM: Magnesium: 1.6 mg/dL — ABNORMAL LOW (ref 1.7–2.4)

## 2019-05-15 SURGERY — ESOPHAGOGASTRODUODENOSCOPY (EGD) WITH PROPOFOL
Anesthesia: General

## 2019-05-15 MED ORDER — MAGNESIUM SULFATE 2 GM/50ML IV SOLN
2.0000 g | Freq: Once | INTRAVENOUS | Status: AC
  Administered 2019-05-15: 2 g via INTRAVENOUS
  Filled 2019-05-15: qty 50

## 2019-05-15 MED ORDER — FENTANYL CITRATE (PF) 100 MCG/2ML IJ SOLN
INTRAMUSCULAR | Status: AC
  Filled 2019-05-15: qty 2

## 2019-05-15 MED ORDER — METRONIDAZOLE IN NACL 5-0.79 MG/ML-% IV SOLN
500.0000 mg | Freq: Three times a day (TID) | INTRAVENOUS | Status: DC
  Administered 2019-05-15 – 2019-05-18 (×10): 500 mg via INTRAVENOUS
  Filled 2019-05-15 (×10): qty 100

## 2019-05-15 MED ORDER — POTASSIUM CHLORIDE 10 MEQ/100ML IV SOLN
10.0000 meq | INTRAVENOUS | Status: DC
  Administered 2019-05-15 (×3): 10 meq via INTRAVENOUS
  Filled 2019-05-15 (×3): qty 100

## 2019-05-15 MED ORDER — PROPOFOL 10 MG/ML IV BOLUS
INTRAVENOUS | Status: DC | PRN
  Administered 2019-05-15 (×2): 50 mg via INTRAVENOUS
  Administered 2019-05-15: 100 mg via INTRAVENOUS
  Administered 2019-05-15 (×2): 20 mg via INTRAVENOUS

## 2019-05-15 MED ORDER — LIDOCAINE HCL (CARDIAC) PF 100 MG/5ML IV SOSY
PREFILLED_SYRINGE | INTRAVENOUS | Status: DC | PRN
  Administered 2019-05-15: 2 mL via INTRATRACHEAL

## 2019-05-15 MED ORDER — FENTANYL CITRATE (PF) 100 MCG/2ML IJ SOLN
INTRAMUSCULAR | Status: DC | PRN
  Administered 2019-05-15: 50 ug via INTRAVENOUS

## 2019-05-15 MED ORDER — OXYCODONE HCL 5 MG PO TABS
5.0000 mg | ORAL_TABLET | ORAL | Status: DC | PRN
  Administered 2019-05-15 – 2019-05-18 (×13): 5 mg via ORAL
  Filled 2019-05-15 (×13): qty 1

## 2019-05-15 MED ORDER — LIDOCAINE HCL (CARDIAC) PF 100 MG/5ML IV SOSY: PREFILLED_SYRINGE | INTRAVENOUS | Status: DC | PRN

## 2019-05-15 MED ORDER — LEVETIRACETAM 500 MG PO TABS
500.0000 mg | ORAL_TABLET | Freq: Two times a day (BID) | ORAL | Status: DC
  Administered 2019-05-15 – 2019-05-18 (×8): 500 mg via ORAL
  Filled 2019-05-15 (×8): qty 1

## 2019-05-15 MED ORDER — SODIUM CHLORIDE 0.9 % IV SOLN
INTRAVENOUS | Status: DC
  Administered 2019-05-15 – 2019-05-16 (×2): via INTRAVENOUS

## 2019-05-15 MED ORDER — TIZANIDINE HCL 2 MG PO TABS
2.0000 mg | ORAL_TABLET | Freq: Three times a day (TID) | ORAL | Status: DC
  Administered 2019-05-15 – 2019-05-16 (×3): 2 mg via ORAL
  Filled 2019-05-15 (×3): qty 1

## 2019-05-15 MED ORDER — PROPOFOL 10 MG/ML IV BOLUS
INTRAVENOUS | Status: AC
  Filled 2019-05-15: qty 40

## 2019-05-15 MED ORDER — ONDANSETRON HCL 4 MG/2ML IJ SOLN
4.0000 mg | Freq: Four times a day (QID) | INTRAMUSCULAR | Status: DC | PRN
  Administered 2019-05-15 – 2019-05-16 (×2): 4 mg via INTRAVENOUS
  Filled 2019-05-15 (×3): qty 2

## 2019-05-15 MED ORDER — SODIUM CHLORIDE 0.9 % IV SOLN
8.0000 mg/h | INTRAVENOUS | Status: DC
  Administered 2019-05-15 – 2019-05-18 (×6): 8 mg/h via INTRAVENOUS
  Filled 2019-05-15 (×8): qty 80

## 2019-05-15 MED ORDER — STERILE WATER FOR IRRIGATION IR SOLN
Status: DC | PRN
  Administered 2019-05-15: 2.5 mL

## 2019-05-15 MED ORDER — PANTOPRAZOLE SODIUM 40 MG IV SOLR
40.0000 mg | Freq: Two times a day (BID) | INTRAVENOUS | Status: DC
  Administered 2019-05-15: 40 mg via INTRAVENOUS
  Filled 2019-05-15: qty 40

## 2019-05-15 MED ORDER — TIZANIDINE HCL 2 MG PO TABS
2.0000 mg | ORAL_TABLET | Freq: Four times a day (QID) | ORAL | Status: DC | PRN
  Filled 2019-05-15: qty 1

## 2019-05-15 MED ORDER — ALBUTEROL SULFATE (2.5 MG/3ML) 0.083% IN NEBU: 2.5000 mg | INHALATION_SOLUTION | Freq: Four times a day (QID) | RESPIRATORY_TRACT | Status: DC | PRN

## 2019-05-15 MED ORDER — ONDANSETRON HCL 4 MG PO TABS
4.0000 mg | ORAL_TABLET | Freq: Four times a day (QID) | ORAL | Status: DC | PRN
  Administered 2019-05-15: 4 mg via ORAL
  Filled 2019-05-15: qty 1

## 2019-05-15 MED ORDER — MIDAZOLAM HCL 2 MG/2ML IJ SOLN
INTRAMUSCULAR | Status: AC
  Filled 2019-05-15: qty 2

## 2019-05-15 MED ORDER — HEPARIN SODIUM (PORCINE) 5000 UNIT/ML IJ SOLN
5000.0000 [IU] | Freq: Three times a day (TID) | INTRAMUSCULAR | Status: DC
  Administered 2019-05-15: 5000 [IU] via SUBCUTANEOUS
  Filled 2019-05-15: qty 1

## 2019-05-15 MED ORDER — SODIUM CHLORIDE 0.9 % IV BOLUS
1000.0000 mL | Freq: Once | INTRAVENOUS | Status: AC
  Administered 2019-05-15: 1000 mL via INTRAVENOUS

## 2019-05-15 MED ORDER — POTASSIUM CHLORIDE 10 MEQ/100ML IV SOLN
10.0000 meq | INTRAVENOUS | Status: DC
  Administered 2019-05-15: 10 meq via INTRAVENOUS
  Filled 2019-05-15: qty 100

## 2019-05-15 MED ORDER — SODIUM CHLORIDE (PF) 0.9 % IJ SOLN
PREFILLED_SYRINGE | INTRAMUSCULAR | Status: DC | PRN
  Administered 2019-05-15: 4 mL

## 2019-05-15 MED ORDER — NICOTINE 14 MG/24HR TD PT24
14.0000 mg | MEDICATED_PATCH | Freq: Every day | TRANSDERMAL | Status: DC
  Administered 2019-05-15 – 2019-05-18 (×4): 14 mg via TRANSDERMAL
  Filled 2019-05-15 (×5): qty 1

## 2019-05-15 MED ORDER — INFLUENZA VAC SPLIT QUAD 0.5 ML IM SUSY
0.5000 mL | PREFILLED_SYRINGE | INTRAMUSCULAR | Status: DC
  Filled 2019-05-15: qty 0.5

## 2019-05-15 MED ORDER — IOHEXOL 300 MG/ML  SOLN
100.0000 mL | Freq: Once | INTRAMUSCULAR | Status: AC | PRN
  Administered 2019-05-15: 100 mL via INTRAVENOUS

## 2019-05-15 MED ORDER — PANTOPRAZOLE SODIUM 40 MG IV SOLR: 40.0000 mg | Freq: Two times a day (BID) | INTRAVENOUS | Status: DC

## 2019-05-15 MED ORDER — SODIUM CHLORIDE FLUSH 0.9 % IV SOLN
INTRAVENOUS | Status: AC
  Filled 2019-05-15: qty 10

## 2019-05-15 MED ORDER — SODIUM CHLORIDE 0.9 % IV SOLN
INTRAVENOUS | Status: DC
  Administered 2019-05-15 (×2): via INTRAVENOUS

## 2019-05-15 MED ORDER — CIPROFLOXACIN IN D5W 400 MG/200ML IV SOLN
400.0000 mg | Freq: Two times a day (BID) | INTRAVENOUS | Status: DC
  Administered 2019-05-15 – 2019-05-18 (×7): 400 mg via INTRAVENOUS
  Filled 2019-05-15 (×7): qty 200

## 2019-05-15 MED ORDER — ONDANSETRON HCL 4 MG/2ML IJ SOLN
4.0000 mg | Freq: Once | INTRAMUSCULAR | Status: AC | PRN
  Administered 2019-05-15: 4 mg via INTRAVENOUS

## 2019-05-15 MED ORDER — FENTANYL CITRATE (PF) 100 MCG/2ML IJ SOLN
25.0000 ug | INTRAMUSCULAR | Status: DC | PRN
  Administered 2019-05-15 (×2): 50 ug via INTRAVENOUS

## 2019-05-15 MED ORDER — ONDANSETRON HCL 4 MG/2ML IJ SOLN
INTRAMUSCULAR | Status: AC
  Filled 2019-05-15: qty 2

## 2019-05-15 MED ORDER — MILNACIPRAN HCL 50 MG PO TABS
50.0000 mg | ORAL_TABLET | Freq: Two times a day (BID) | ORAL | Status: DC
  Administered 2019-05-16 – 2019-05-18 (×5): 50 mg via ORAL
  Filled 2019-05-15 (×9): qty 1

## 2019-05-15 MED ORDER — LIDOCAINE 2% (20 MG/ML) 5 ML SYRINGE
INTRAMUSCULAR | Status: AC
  Filled 2019-05-15: qty 5

## 2019-05-15 MED ORDER — CALCIUM GLUCONATE-NACL 1-0.675 GM/50ML-% IV SOLN
1.0000 g | Freq: Once | INTRAVENOUS | Status: AC
  Administered 2019-05-15: 1000 mg via INTRAVENOUS
  Filled 2019-05-15: qty 50

## 2019-05-15 MED ORDER — EPINEPHRINE 1 MG/10ML IJ SOSY
PREFILLED_SYRINGE | INTRAMUSCULAR | Status: AC
  Filled 2019-05-15: qty 10

## 2019-05-15 MED ORDER — SODIUM CHLORIDE 0.9 % IV SOLN
80.0000 mg | Freq: Once | INTRAVENOUS | Status: AC
  Administered 2019-05-15: 80 mg via INTRAVENOUS
  Filled 2019-05-15: qty 80

## 2019-05-15 NOTE — Anesthesia Postprocedure Evaluation (Signed)
Anesthesia Post Note  Patient: Sharon Welch  Procedure(s) Performed: ESOPHAGOGASTRODUODENOSCOPY (EGD) WITH PROPOFOL (N/A )  Patient location during evaluation: PACU Anesthesia Type: General Level of consciousness: awake and alert and oriented Pain management: pain level controlled Vital Signs Assessment: post-procedure vital signs reviewed and stable Respiratory status: spontaneous breathing and nonlabored ventilation Cardiovascular status: blood pressure returned to baseline and stable Postop Assessment: no apparent nausea or vomiting Anesthetic complications: no     Last Vitals:  Vitals:   05/15/19 1210 05/15/19 1257  BP: (!) 163/79 130/75  Pulse: 90 92  Resp: 20 20  Temp:    SpO2: 100% 100%    Last Pain:  Vitals:   05/15/19 1257  TempSrc:   PainSc: 4                  Jhade Berko C Ziv Welchel

## 2019-05-15 NOTE — ED Notes (Signed)
Report given to Lurline Del, RN.

## 2019-05-15 NOTE — H&P (Signed)
TRH H&P    Patient Demographics:    Sharon Welch, is a 48 y.o. female  MRN: [id number]  DOB - 1970-10-08  Admit Date - 05/14/2019  Referring MD/NP/PA: Dr. Eulis Foster  Outpatient Primary MD for the patient is [facility]  Patient coming from: Home  Chief complaint-throwing up blood   HPI:    Sharon Welch  is a 48 y.o. female, with history of seizures, fibromyalgia, and chronic pain presents to the ED with a chief complaint of hematemesis.  Patient reports that it started the morning of November 20.  She reports that she had been throwing up 8-9 times, and does not know how many of these episodes had blood present.  She reports that she was laying in a black room in a black carpet due to headache, and did not realize there was hematemesis until she laid a blue towel down so that she "was not laying in Person."  Patient reports when she noticed blood she thinks it was dark blood.  She has had abdominal pain.  It is in the lower abdomen and goes all the way across.  She reports that this is her typical IBS pain.  It is 8-9 out of 10 on a pain scale.  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  Patient reports that she has had 2 melanotic stools in the ER.  Her last BM before that was at least a week ago.  She reports that all of her stools are solid.  Patient reports that her last meal was 24 hours ago.  This is abnormal for her she usually eats multiple times a day at the insistence of her husband.  Patient reports no known history of liver problems.  She had cholecystitis with a cholecystectomy and pancreatitis following that 4 to 5 years ago.  Patient reports at that time she also had an EGD.  She has never had a colonoscopy.  On review of systems patient reports that she feels dehydrated, short of breath, chest pain for 2 days, dizziness for 2 days, and tingly arm since Wednesday or  Thursday.  She reports the symptoms are new for her.  She has not noticed any other signs of bleed, other than what is noted above.  Patient reports that she has had no fevers, no chills.  She has no rashes ulcers or other skin lesions.  She has not had any dysuria or hematuria, but does report that her urine is a dark color which she attributes to dehydration.  Patient has not been on steroids - and there are no other reported etiologies, aside from infection, that could be causing her WBC.    In the ED Hematology reveals a leukocytosis of 26,000.  CHEM panel reveals hypokalemia at 3.3, bicarb decreased down to 15, hypomagnesemia 1.5 and a corrected calcium of 6.7.  CT abdomen revealed 1 post cholecystectomy with intra and extrahepatic biliary ductal dilatation.  No visualized choledocholithiasis.  Recommend correlation with LFTs.  If LFTs are elevated recommend further evaluation with MRCP  or ERCP. 2.  Mild small bowel hyperemia pelvic bowel loops with mild adjacent sigmoid colonic wall thickening findings suggest mild generalized enteritis/ colitis.   Review of systems:    In addition to the HPI above,  No Fever-chills, Positive for chronic recurrent Headache, No changes with Vision or hearing, No problems swallowing food or Liquids, No Chest pain, Cough Positive for Shortness of Breath, Positive for sharp abdominal pain, nausea, and vomiting Positive for melena, No dysuria, No new skin rashes or bruises, No new joints pains-aches,  No new weakness, Positive for tingling in arms BL, no numbness in any extremity, Recent weight loss of 20 pounds in 2 months No polyuria, polydypsia or polyphagia, No significant Mental Stressors.  All other systems reviewed and are negative.    Past History of the following :    Past Medical History:  Diagnosis Date   Bruising, spontaneous 12/08/2013   Chronic back pain    Chronic back pain    DDD (degenerative disc disease), lumbar    DDD  (degenerative disc disease), lumbar    Fibromyalgia    Headache(784.0)    Ovarian cyst    Partial tear subscapularis tendon 12/27/2011   Scoliosis    Seizures ([place])    started 9/12-      Past Surgical History:  Procedure Laterality Date   ABDOMINAL HYSTERECTOMY  2012   BACK SURGERY  1989   scoliosis throsic-rods   CESAREAN SECTION     CHOLECYSTECTOMY N/A 11/17/2012   Procedure: LAPAROSCOPIC CHOLECYSTECTOMY WITH INTRAOPERATIVE CHOLANGIOGRAM;  Surgeon: Ralene Ok, MD;  Location: [facility];  Service: General;  Laterality: N/A;   ERCP N/A 05/07/2013   Procedure: ENDOSCOPIC RETROGRADE CHOLANGIOPANCREATOGRAPHY (ERCP);  Surgeon: Beryle Beams, MD;  Location: [facility];  Service: Endoscopy;  Laterality: N/A;  start ercp first, if canulation fails switch to EUS    EUS N/A 11/03/2012   Procedure: UPPER ENDOSCOPIC ULTRASOUND (EUS) LINEAR;  Surgeon: Beryle Beams, MD;  Location: [facility];  Service: Endoscopy;  Laterality: N/A;   left arm     NECK SURGERY     TUBAL LIGATION        Social History:      Social History   Tobacco Use   Smoking status: Current Every Day Smoker    Packs/day: 0.50    Years: 20.00    Pack years: 10.00    Types: Cigarettes   Smokeless tobacco: Never Used  Substance Use Topics   Alcohol use: No    Alcohol/week: 0.0 standard drinks    Comment: 1-2 sips a years       Family History :     Family History  Problem Relation Age of Onset   Hypertension Mother    Hypertension Father    Cancer Father        prostate   Heart disease Father    Hypertension Brother    Hypertension Brother    Family history reviewed   Home Medications:   Prior to Admission medications   Medication Sig Start Date End Date Taking? Authorizing Provider  HYDROcodone-acetaminophen (NORCO) 10-325 MG tablet Take 1 tablet by mouth every 4 (four) hours as needed for moderate pain or severe pain.   Yes [provider]  levETIRAcetam  (KEPPRA) 500 MG tablet Take 1 tablet (500 mg total) by mouth 2 (two) times daily. 05/07/19  Yes Nat Christen, MD  Milnacipran (SAVELLA) 50 MG TABS tablet Take 50 mg by mouth 2 (two) times daily.   Yes [provider]  tiZANidine (ZANAFLEX) 2 MG tablet Take 2 mg by mouth every 6 (six) hours as needed for muscle spasms.   Yes [provider]     Allergies:     Allergies  Allergen Reactions   Tramadol Other (See Comments)    Possible seizures     Physical Exam:   Vitals  Blood pressure 125/76, pulse (!) 125, temperature 98.6 F (37 C), temperature source Oral, resp. rate 18, height 5' (1.524 m), weight 42.2 kg, SpO2 100 %.  1.  General: Left lateral decubitus in bed, blanket overhead, appears in pain  2. Psychiatric: Mood and behavior normal for situation  3. Neurologic: Cranial nerves II through XII are grossly intact, moves all 4 extremities voluntarily, no focal deficits on limited exam  4. HEENMT:  Head is atraumatic normocephalic pupils are reactive to light extraocular movement muscles are intact trachea is midline  5. Respiratory : Wheezing present in lower lung fields no rhonchi or crackles  6. Cardiovascular : Tachycardic rate with regular rhythm no murmur  7. Gastrointestinal:  Abdomen is soft diffusely tender without guarding negative Murphy's negative Rovsing's  8. Skin:  No acute lesions on limited skin exam  9.Musculoskeletal:  No peripheral edema    Data Review:    CBC Recent Labs  Lab 05/14/19 1955 05/14/19 2354  WBC 19.6* 26.0*  HGB 13.3 12.7  HCT 40.0 37.2  PLT 455* 308  MCV 97.3 94.2  MCH 32.4 32.2  MCHC 33.3 34.1  RDW 12.7 12.5   ------------------------------------------------------------------------------------------------------------------  Results for orders placed or performed during the hospital encounter of 05/14/19 (from the past 48 hour(s))  Type and screen     Status: None   Collection Time: 05/14/19   7:51 PM  Result Value Ref Range   ABO/RH(D) B POS    Antibody Screen NEG    Sample Expiration      05/17/2019,2359 Performed at [facility], [address], [place], [place] [address]   Lipase, blood     Status: None   Collection Time: 05/14/19  7:55 PM  Result Value Ref Range   Lipase 19 11 - 51 U/L    Comment: Performed at [facility], [address] [place], [place] [address]  Comprehensive metabolic panel     Status: Abnormal   Collection Time: 05/14/19  7:55 PM  Result Value Ref Range   Sodium 139 135 - 145 mmol/L   Potassium 4.3 3.5 - 5.1 mmol/L   Chloride 106 98 - 111 mmol/L   CO2 22 22 - 32 mmol/L   Glucose, Bld 134 (H) 70 - 99 mg/dL   BUN 49 (H) 6 - 20 mg/dL   Creatinine, Ser 0.76 0.44 - 1.00 mg/dL   Calcium 9.0 8.9 - 10.3 mg/dL   Total Protein 7.2 6.5 - 8.1 g/dL   Albumin 4.0 3.5 - 5.0 g/dL   AST 61 (H) 15 - 41 U/L   ALT 133 (H) 0 - 44 U/L   Alkaline Phosphatase 549 (H) 38 - 126 U/L   Total Bilirubin 0.6 0.3 - 1.2 mg/dL   GFR calc non Af Amer >60 >60 mL/min   GFR calc Af Amer >60 >60 mL/min   Anion gap 11 5 - 15    Comment: Performed at [facility], [address] [place], [place] [address]  CBC     Status: Abnormal   Collection Time: 05/14/19  7:55 PM  Result Value Ref Range   WBC 19.6 (H) 4.0 -  10.5 K/uL   RBC 4.11 3.87 - 5.11 MIL/uL   Hemoglobin 13.3 12.0 - 15.0 g/dL   HCT 40.0 36.0 - 46.0 %   MCV 97.3 80.0 - 100.0 fL   MCH 32.4 26.0 - 34.0 pg   MCHC 33.3 30.0 - 36.0 g/dL   RDW 12.7 11.5 - 15.5 %   Platelets 455 (H) 150 - 400 K/uL   nRBC 0.0 0.0 - 0.2 %    Comment: Performed at [facility], [address], [place], [place] [address]  Protime-INR     Status: None   Collection Time: 05/14/19  7:55 PM  Result Value Ref Range   Prothrombin Time 13.0 11.4 - 15.2 seconds   INR 1.0 0.8 - 1.2    Comment: (NOTE) INR goal varies based on device and disease states. Performed at [facility], [address], [place], [place] [address]   POC occult blood, ED      Status: Abnormal   Collection Time: 05/14/19 11:15 PM  Result Value Ref Range   Fecal Occult Bld POSITIVE (A) NEGATIVE  CBC     Status: Abnormal   Collection Time: 05/14/19 11:54 PM  Result Value Ref Range   WBC 26.0 (H) 4.0 - 10.5 K/uL   RBC 3.95 3.87 - 5.11 MIL/uL   Hemoglobin 12.7 12.0 - 15.0 g/dL   HCT 37.2 36.0 - 46.0 %   MCV 94.2 80.0 - 100.0 fL   MCH 32.2 26.0 - 34.0 pg   MCHC 34.1 30.0 - 36.0 g/dL   RDW 12.5 11.5 - 15.5 %   Platelets 308 150 - 400 K/uL   nRBC 0.0 0.0 - 0.2 %    Comment: Performed at [facility], [address] [place], [place] [address]    Chemistries  Recent Labs  Lab 05/14/19 1955  NA 139  K 4.3  CL 106  CO2 22  GLUCOSE 134*  BUN 49*  CREATININE 0.76  CALCIUM 9.0  AST 61*  ALT 133*  ALKPHOS 549*  BILITOT 0.6   ------------------------------------------------------------------------------------------------------------------  ------------------------------------------------------------------------------------------------------------------ GFR: Estimated Creatinine Clearance: 56 mL/min (by C-G formula based on SCr of 0.76 mg/dL). Liver Function Tests: Recent Labs  Lab 05/14/19 1955  AST 61*  ALT 133*  ALKPHOS 549*  BILITOT 0.6  PROT 7.2  ALBUMIN 4.0   Recent Labs  Lab 05/14/19 1955  LIPASE 19   No results for input(s): AMMONIA in the last 168 hours. Coagulation Profile: Recent Labs  Lab 05/14/19 1955  INR 1.0   Cardiac Enzymes: No results for input(s): CKTOTAL, CKMB, CKMBINDEX, TROPONINI in the last 168 hours. BNP (last 3 results) No results for input(s): PROBNP in the last 8760 hours. HbA1C: No results for input(s): HGBA1C in the last 72 hours. CBG: No results for input(s): GLUCAP in the last 168 hours. Lipid Profile: No results for input(s): CHOL, HDL, LDLCALC, TRIG, CHOLHDL, LDLDIRECT in the last 72 hours. Thyroid Function Tests: No results for input(s): TSH, T4TOTAL, FREET4, T3FREE, THYROIDAB in the last 72  hours. Anemia Panel: No results for input(s): VITAMINB12, FOLATE, FERRITIN, TIBC, IRON, RETICCTPCT in the last 72 hours.  --------------------------------------------------------------------------------------------------------------- Urine analysis:    Component Value Date/Time   COLORURINE YELLOW 06/03/2014 1952   APPEARANCEUR TURBID (A) 06/03/2014 1952   LABSPEC 1.026 06/03/2014 1952   PHURINE 5.5 06/03/2014 1952   GLUCOSEU NEGATIVE 06/03/2014 1952   HGBUR NEGATIVE 06/03/2014 1952   BILIRUBINUR NEGATIVE 06/03/2014 [place] NEGATIVE 06/03/2014 1952   PROTEINUR NEGATIVE 06/03/2014 1952  UROBILINOGEN 0.2 06/03/2014 1952   NITRITE NEGATIVE 06/03/2014 1952   LEUKOCYTESUR NEGATIVE 06/03/2014 1952      Imaging Results:    Ct Abdomen Pelvis W Contrast  Result Date: 05/15/2019 CLINICAL DATA:  Acute generalized abdominal pain. Patient reports vomiting blood. EXAM: CT ABDOMEN AND PELVIS WITH CONTRAST TECHNIQUE: Multidetector CT imaging of the abdomen and pelvis was performed using the standard protocol following bolus administration of intravenous contrast. CONTRAST:  115mL OMNIPAQUE IOHEXOL 300 MG/ML  SOLN COMPARISON:  CT 05/08/2013 FINDINGS: Lower chest: Emphysema. No acute airspace disease or pleural fluid. Hepatobiliary: 7 mm cyst in the right lobe of the liver. Additional tiny hepatic hypodensities that are too small to characterize. Postcholecystectomy. Common bile duct is dilated at 13 mm. Mild central intrahepatic biliary ductal dilatation. No visualized choledocholithiasis. Pancreas: No ductal dilatation or inflammation. Spleen: Normal in size without focal abnormality. Heterogeneity on initial imaging normal for phase of IV contrast. Adrenals/Urinary Tract: No adrenal nodule. No hydronephrosis or perinephric edema. Small cyst in the left mid kidney. Urinary bladder is partially distended, questionable mild bladder wall thickening. Stomach/Bowel: Bowel evaluation is limited in  the absence of enteric contrast and paucity of intra-abdominal fat. Peri pyloric gastric wall thickening versus peristalsis. Fluid-filled small bowel without obstruction. Questionable mild wall hyperemia of pelvic small bowel loops. Normal appendix. Mild colonic wall thickening of sigmoid colon. Vascular/Lymphatic: Abdominal aorta is normal in caliber. The portal vein is patent. No adenopathy in the abdomen or pelvis. Reproductive: Status post hysterectomy. No adnexal masses. Other: No free air, free fluid, or intra-abdominal fluid collection. Musculoskeletal: Scoliosis with postsurgical change of the thoracolumbar spine. There are no acute or suspicious osseous abnormalities. IMPRESSION: 1. Postcholecystectomy with intra and extrahepatic biliary ductal dilatation. No visualized choledocholithiasis. Recommend correlation with LFTs. If LFTs are elevated, recommend further evaluation with MRCP or ERCP. 2. Mild small bowel hyperemia of pelvic bowel loops, with mild adjacent sigmoid colonic wall thickening. Findings suggest mild generalized enteritis/colitis. Aortic Atherosclerosis (ICD10-I70.0) and Emphysema (ICD10-J43.9). Electronically Signed   By: Keith Rake M.D.   On: 05/15/2019 01:10    My personal review of EKG: Rhythm sinus tach, Rate 125 /min, QTc  456,no Acute ST changes   Assessment & Plan:    Active Problems:   GI bleed   1. GI bleed 1. FOBT + with complaints of hematemesis and melena 2. NPO 3. Consult GI - will likely need scope 4. Protonix 40mg  BID 5. Type and screen ordered in ED 6. HH q6 hours 7. Zofran for nausea 8. I and O q shift 2. Leukocytosis 1. WBC 26 2. Possible colitis - cover with flagyl and cipro 3. Posisble UTI - normal UA, but bladder wall thickening on CT - Cipro will also cover this 4. Send urine culture 5. Continue to monitor with daily cbc 3. Abnormal CT 1. S/p cholecystectomy with signs of possible choledocolithiasis 2. MRCP ordered 3. Hyperemia of  bowel loops - possibly colitis - cover with cipro and flagyl 4. Consider CTA if lactic acid does not trend down after second bolus - hyperemia can be seen in ischemic bowel - although not favored by radiologist 4. Lactic acidosis 1. 4.0 then 4.1.  2. Second fluid bolus ordered 3. Continue maintenance fluids after that 4. If lactate doesn't clear - consider CTA of abdomen 5. Elevated LFTs 1. Hepatitis panel ordered 2. MRCP ordered as rec'd with abnormal CT 3. Continue to trend with am CMP 4. Hold hepatotoxic medications when possible - proceed with antibiotic therapy  as the benefit outweighs the risk with the information that is currently available 6. Hyperglycemia 1. Last reported meal more than 24 hours ago, glucose in 130s.  2. Hgb A1C ordered 3. Patient currently NPO, consider carb consistent diet when able to tolerate PO intake 7.  Hypokalemia  -Replace and recheck. 8. Hypomagnesemia  -Replace and recheck 9.  Hypocalcemia  -Replace, recheck, check PTH 10.  Protein calorie malnutrition  -when patient is able to take PO, advise nutrient dense food choices.    DVT Prophylaxis-Heparin and SCDs  AM Labs Ordered, also please review Full Orders  Family Communication: No family at bedside Code Status: Full  Admission status: Inpatient: Based on patients clinical presentation and evaluation of above clinical data, I have made determination that patient meets Inpatient criteria at this time.  Time spent in minutes : 68   Rolla Plate M.D on 05/15/2019 at 4:06 AM

## 2019-05-15 NOTE — Transfer of Care (Signed)
Immediate Anesthesia Transfer of Care Note  Patient: NAMARI SHUPP  Procedure(s) Performed: ESOPHAGOGASTRODUODENOSCOPY (EGD) WITH PROPOFOL (N/A )  Patient Location: PACU  Anesthesia Type:General  Level of Consciousness: awake, alert , oriented and sedated  Airway & Oxygen Therapy: Patient Spontanous Breathing and Patient connected to nasal cannula oxygen  Post-op Assessment: Report given to RN and Post -op Vital signs reviewed and stable  Post vital signs: Reviewed and stable  Last Vitals:  Vitals Value Taken Time  BP 157/87 05/15/19 1340  Temp 97.8   Pulse 89 05/15/19 1345  Resp 26 05/15/19 1345  SpO2 100 % 05/15/19 1345  Vitals shown include unvalidated device data.  Last Pain:  Vitals:   05/15/19 1257  TempSrc:   PainSc: 4       Patients Stated Pain Goal: 6 ([id number])  Complications: No apparent anesthesia complications

## 2019-05-15 NOTE — ED Notes (Signed)
Date and time results received: 05/15/19 04:09 (Test: 4.0 Critical Value: Lactic  Name of Provider Notified: Hospitalist  Orders Received? Or Actions Taken?: physician notified

## 2019-05-15 NOTE — Consult Note (Addendum)
Referring Provider: No ref. provider found Primary Care Physician:  [facility] Primary Gastroenterologist:  Dr. Carol Ada  Reason for Consultation:  Hematemesis, ENTERITIS/COLITIS, ELEVATED LIVER ENZYMES   Impression: ADMITTED WITH HEMATEMESIS AND MELENA WHILE TAKING ALEVE W/O PPI. DIFFERENTIAL DIAGNOSIS INCLUDES: PUD, AVMs, DIEULAFOY'S LESION, LESS LIKELY Esophageal varices, GE JUNCTION TUMOR, GASTRIC CA OR MESENTERIC ISCHEMIA. ELEVATED LIVER ENZYMES MIXED INJURY PATTERN ASSOCIATED WITH DILATED IH/EH DUCTS/ LAST ERCP/SPHINCTEROTOMY IN 2014 AND NO STONES IN DUCT. CT ALSO SHOWS MILD ENTERITIS/COLITIS.   Plan: 1. PROTONIX IV 2. NPO EXCEPT SIPS WITH MEDS. 3. EGD WITH MAC TODAY. DISCUSSED PROCEDURE, BENEFITS, & RISKS: < 1% chance of medication reaction, bleeding OR  Perforationrequiring surgery to fix it, ASPIRATION, and missed polyps < 1 cm 10-20% of the time. 4. OBTAIN ACUTE HEPATITIS PANEL, AMA, ANA, ASMA, & qIg. 5. AGREE WITH MRCP ON NOV 23. 6. CONTINUE CIP/FLAGYL FOR 5-7 DAYS.     HPI:  PT IN HER USUAL STATE OF HEALTH TAKING ALEVE 2 A DAY FOR CHRONIC PAIN. SHE DENIES TAKING A PPI OR H2B. DEVELOPED SUDDEN ONSET OF VOMITING BLOOD AND PASSING BLACK TARRY STOOLS. PT REPORTS HAVING SOB. DENIES ABDOMINAL PAIN. NO ASPIRIN, BC/GOODY POWDERS, OR IBUPROFEN/MOTRIN.   PT DENIES FEVER, CHILLS, HEMATOCHEZIA, HEMATEMESIS, diarrhea, CHEST PAIN, CHANGE IN BOWEL IN HABITS, constipation, abdominal pain, problems swallowing, problems with sedation, OR heartburn or indigestion.    Past Medical History:  Diagnosis Date  . Bruising, spontaneous 12/08/2013  . Chronic back pain   . Chronic back pain   . DDD (degenerative disc disease), lumbar   . DDD (degenerative disc disease), lumbar   . Fibromyalgia   . Headache(784.0)   . Ovarian cyst   . Partial tear subscapularis tendon 12/27/2011  . Scoliosis   . Seizures ([place])    started 9/12-    Past Surgical History:   Procedure Laterality Date  . ABDOMINAL HYSTERECTOMY  2012  . BACK SURGERY  1989   scoliosis throsic-rods  . CESAREAN SECTION    . CHOLECYSTECTOMY N/A 11/17/2012   Procedure: LAPAROSCOPIC CHOLECYSTECTOMY WITH INTRAOPERATIVE CHOLANGIOGRAM;  Surgeon: Ralene Ok, MD;  Location: [facility];  Service: General;  Laterality: N/A;  . ERCP N/A 05/07/2013   Procedure: ENDOSCOPIC RETROGRADE CHOLANGIOPANCREATOGRAPHY (ERCP);  Surgeon: DR. HUNG  . EUS N/A 11/03/2012   Procedure: UPPER ENDOSCOPIC ULTRASOUND (EUS) LINEAR;  Surgeon: Beryle Beams, MD;  Location: [facility];  Service: Endoscopy;  Laterality: N/A;  . left arm    . NECK SURGERY    . TUBAL LIGATION      Current Facility-Administered Medications  Medication Dose Route Frequency     . 0.9 %  sodium chloride infusion   Intravenous Continuous     . albuterol (PROVENTIL) (2.5 MG/3ML) 0.083% nebulizer solution 2.5 mg  2.5 mg Nebulization Q6H PRN     . bupivacaine (PF) (MARCAINE) 0.25 % injection 30 mL  30 mL Other Once     . ceFAZolin (ANCEF) IVPB 1 g/50 mL premix  1 g Intravenous Once     . ciprofloxacin (CIPRO) IVPB 400 mg  400 mg Intravenous Q12H     . fentaNYL (SUBLIMAZE) injection 100 mcg  100 mcg Intravenous Once     . lactated ringers infusion 1,000 mL  1,000 mL Intravenous Continuous     . lactated ringers infusion 1,000 mL  1,000 mL Intravenous Continuous     . lactated ringers infusion 1,000 mL  1,000 mL Intravenous Continuous     . levETIRAcetam (KEPPRA) tablet  500 mg  500 mg Oral BID     . lidocaine (PF) (XYLOCAINE) 1 % injection 10 mL  10 mL Subcutaneous Once     . magnesium sulfate IVPB 2 g 50 mL  2 g Intravenous Once     . metroNIDAZOLE (FLAGYL) IVPB 500 mg  500 mg Intravenous Q8H     . midazolam (VERSED) 5 MG/5ML injection 5 mg  5 mg Intravenous Once     . Milnacipran (SAVELLA) tablet TABS 50 mg  50 mg Oral BID     . ondansetron (ZOFRAN) tablet 4 mg  4 mg Oral Q6H PRN      Or  . ondansetron (ZOFRAN) injection 4 mg  4 mg  Intravenous Q6H PRN     . orphenadrine (NORFLEX) injection 60 mg  60 mg Intramuscular Once     . pantoprazole (PROTONIX) injection 40 mg  40 mg Intravenous Q12H     . potassium chloride 10 mEq in 100 mL IVPB  10 mEq Intravenous Q1 Hr x 4     . tiZANidine (ZANAFLEX) tablet 2 mg  2 mg Oral Q6H PRN     . triamcinolone acetonide (KENALOG-40) injection 40 mg  40 mg Other Once      Current Outpatient Medications  Medication Sig    . HYDROcodone-acetaminophen (NORCO) 10-325 MG tablet Take 1 tablet by mouth every 4 (four) hours as needed for moderate pain or severe pain.    Marland Kitchen levETIRAcetam (KEPPRA) 500 MG tablet Take 1 tablet (500 mg total) by mouth 2 (two) times daily.    . Milnacipran (SAVELLA) 50 MG TABS tablet Take 50 mg by mouth 2 (two) times daily.    Marland Kitchen tiZANidine (ZANAFLEX) 2 MG tablet Take 2 mg by mouth every 6 (six) hours as needed for muscle spasms.      Allergies as of 05/14/2019 - Review Complete 05/14/2019  Allergen Reaction Noted  . Tramadol Other (See Comments) 06/21/2011    Family History  Problem Relation Age of Onset  . Hypertension Mother   . Hypertension Father   . Cancer Father        prostate  . Heart disease Father   . Hypertension Brother   . Hypertension Brother      Social History   Socioeconomic History  . Marital status: Divorced    Spouse name: Not on file  . Number of children: Not on file  . Years of education: Not on file  . Highest education level: Not on file  Occupational History  . Not on file  Social Needs  . Financial resource strain: Not on file  . Food insecurity    Worry: Not on file    Inability: Not on file  . Transportation needs    Medical: Not on file    Non-medical: Not on file  Tobacco Use  . Smoking status: Current Every Day Smoker    Packs/day: 0.50    Years: 20.00    Pack years: 10.00    Types: Cigarettes  . Smokeless tobacco: Never Used  Substance and Sexual Activity  . Alcohol use: No    Alcohol/week: 0.0 standard  drinks    Comment: 1-2 sips a years  . Drug use: No  . Sexual activity: Not on file  Lifestyle  . Physical activity    Days per week: Not on file    Minutes per session: Not on file  . Stress: Not on file  Relationships  . Social connections    Talks  on phone: Not on file    Gets together: Not on file    Attends religious service: Not on file    Active member of club or organization: Not on file    Attends meetings of clubs or organizations: Not on file    Relationship status: Not on file  . Intimate partner violence    Fear of current or ex partner: Not on file    Emotionally abused: Not on file    Physically abused: Not on file    Forced sexual activity: Not on file  Other Topics Concern  . Not on file  Social History Narrative  . Not on file   Review of Systems: PER HPI OTHERWISE ALL SYSTEMS ARE NEGATIVE.  Vitals: Blood pressure 130/75, pulse 99, temperature 98.6 F (37 C), temperature source Oral, resp. rate 18, height 5' (1.524 m), weight 42.2 kg, SpO2 100 %.  Physical Exam: General:   Alert,  MILD DISTRESS,and cooperative Head:  Normocephalic and atraumatic. Eyes:  Sclera clear, no icterus.   Conjunctiva pink. Mouth:  No lesions, dentition ABnormal. Neck:  Supple; no masses. Lungs:  Clear throughout to auscultation.   No wheezes. No acute distress. Heart:  Regular rate and rhythm; no murmurs. Abdomen:  Soft, MILDLY tender IN EPIGASTRIUM, nondistended. No masses noted. Normal bowel sounds, without guarding, and without rebound.   Msk:  Symmetrical without gross deformities.  Extremities:  Without edema. Neurologic:  Alert and  oriented x4;  NO  NEW FOCAL DEFICITS Cervical Nodes:  No significant cervical adenopathy. Psych:  Alert and cooperative. ANXIOUS mood and FLAT affect.   Lab Results: Recent Labs    05/14/19 1955 05/14/19 2354 05/15/19 0846  WBC 19.6* 26.0* 20.4*  HGB 13.3 12.7 10.9*  HCT 40.0 37.2 33.8*  PLT 455* 308 366   BMET Recent Labs     05/14/19 1955 05/15/19 0357  NA 139 139  K 4.3 3.3*  CL 106 120*  CO2 22 15*  GLUCOSE 134* 76  BUN 49* 30*  CREATININE 0.76 0.49  CALCIUM 9.0 5.9*   LFT Recent Labs    05/15/19 0357  PROT 4.5*  ALBUMIN 2.5*  AST 29  ALT 71*  ALKPHOS 301*  BILITOT 0.6    Studies/Results:    CT ABD/PELVIS NOV 2020:  1. Postcholecystectomy with intra and extrahepatic biliary ductal dilatation. No visualized choledocholithiasis. 2. Mild small bowel hyperemia of pelvic bowel loops, with mild adjacent sigmoid colonic wall thickening. Findings suggest mild generalized enteritis/colitis.   LOS: 0 days   [facility]  05/15/2019, 9:34 AM

## 2019-05-15 NOTE — Op Note (Addendum)
[facility] [name] Procedure Date: 05/15/2019 10:51 AM MRN: [id number] Date of Birth: 09-Jun-1971 Attending MD: Sharon Drain MD, MD CSN: [id number] Age: 48 Admit Type: Outpatient Procedure:                Upper GI endoscopy-COLD FORCEPS BIOPSY/CONTROL                            BLEEDING/SUBMUCOSAL INJECTION Indications:              Hematemesis, Melena Providers:                Sharon Drain MD, MD, Lurline Del, RN, Aram Candela Referring MD:             [employer], MD Medicines:                Propofol per Anesthesia Complications:            No immediate complications. Estimated Blood Loss:     Estimated blood loss was minimal. Procedure:                Pre-Anesthesia Assessment:                           - Prior to the procedure, a History and Physical                            was performed, and patient medications and                            allergies were reviewed. The patient's tolerance of                            previous anesthesia was also reviewed. The risks                            and benefits of the procedure and the sedation                            options and risks were discussed with the patient.                            All questions were answered, and informed consent                            was obtained. Prior Anticoagulants: The patient has                            taken no previous anticoagulant or antiplatelet                            agents except for NSAID medication. ASA Grade                            Assessment: II - A patient with mild systemic  disease. After reviewing the risks and benefits,                            the patient was deemed in satisfactory condition to                            undergo the procedure. After obtaining informed                            consent, the endoscope was passed under direct                            vision. Throughout the procedure, the  patient's                            blood pressure, pulse, and oxygen saturations were                            monitored continuously. The [id number] ID:[id number])                            scope was introduced through the mouth, and                            advanced to the second part of duodenum. The upper                            GI endoscopy was accomplished without difficulty.                            The patient tolerated the procedure well. Scope In: 1:17:19 PM Scope Out: 1:31:20 PM Total Procedure Duration: 0 hours 14 minutes 1 second  Findings:      The examined esophagus was normal.      One oozing cratered gastric ulcer with a visible vessel was found at the       pylorus. Area was successfully injected with 4 mL of a 1:10,000 solution       of epinephrine for hemostasis. For hemostasis, two hemostatic clips were       successfully placed (MR conditional). There was no bleeding at the end       of the procedure.      Segmental mild inflammation characterized by congestion (edema),       erosions and erythema was found in the gastric body and in the gastric       antrum. Biopsies(2:BODY,1:INCISURA,2:ANTRUM) were taken with a cold       forceps for Helicobacter pylori testing.      Few non-bleeding cratered duodenal ulcers with no stigmata of bleeding       were found in the duodenal bulb.      The second portion of the duodenum was normal. 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                           - MILD Gastritis. Biopsied.                           - Non-bleeding duodenal ulcers with no stigmata of                            bleeding. Moderate Sedation:      Per Anesthesia Care Recommendation:           - Return patient to hospital [name] for ongoing care.                            NEEDS KIB PRIOR TO MRI DUE TO CLIP PLACEMENT ON THE                            PYLORUS.                            - Clear liquid diet.                           - Continue present medications. PROTONIX 80 MG IV                            x1 THEN PROTONIX gtt.                           - Await pathology results.                           - Repeat upper endoscopy in 3 months for                            surveillance.                           - Return to GI office in 2 months. Procedure Code(s):        --- Professional ---                           [id number], 62, Esophagogastroduodenoscopy, flexible,                            transoral; with control of bleeding, any method                           43239, Esophagogastroduodenoscopy, flexible,                            transoral; with biopsy, single or multiple Diagnosis Code(s):        --- Professional ---                           K25.4, Chronic or unspecified gastric ulcer with  hemorrhage                           K29.70, Gastritis, unspecified, without bleeding                           K26.9, Duodenal ulcer, unspecified as acute or                            chronic, without hemorrhage or perforation                           K92.0, Hematemesis                           K92.1, Melena (includes Hematochezia) CPT copyright 2019 [employer]. All rights reserved. The codes documented in this report are preliminary and upon coder review may  be revised to meet current compliance requirements. Sharon Drain, MD Sharon Drain MD, MD 05/15/2019 1:42:44 PM This report has been signed electronically. Number of Addenda: 0

## 2019-05-15 NOTE — Progress Notes (Signed)
  Patient seen and evaluated, chart reviewed, please see EMR for updated orders. Please see full H&P dictated by admitting physician Dr. Rolla Plate  for same date of service.    A/p 1)Acute GI bleed--- patient admitted with melena and hematemesis in the setting of Aleve use--- discussed with Dr. Oneida Alar from GI service, continue IV Protonix patient is for EGD later today  2)Acute Anemia secondary to acute blood loss in the setting of GI bleed--- monitor H&H and transfuse as clinically indicated -Hemoglobin was 15.0 on 05/07/2019, hemoglobin on admission on 05/14/2019 was 13.3, repeat hemoglobin down to 10.9 - 3) enteritis/colitis--- CT abdomen and pelvis report noted, continue Cipro Flagyl  4) elevated LFTs--- status post prior cholecystectomy with the in 2014 without abnormal findings at that time- -MRCP scheduled for 05/17/2019 -Work-up for possible autoimmune and other etiologies of hepatitis pending  5)FEN-hypomagnesemia/hypokalemia/hypocalcemia--suspect due to GI losses and poor oral intake, replace and recheck  6) chronic pain syndrome--- PTA patient was on opiates  Lactic acidosis--- continue to hydrate  Roxan Hockey, MD

## 2019-05-15 NOTE — Anesthesia Preprocedure Evaluation (Signed)
Anesthesia Evaluation  Patient identified by MRN, date of birth, ID band Patient awake    Reviewed: Allergy & Precautions, NPO status , Patient's Chart, lab work & pertinent test results  Airway Mallampati: II  TM Distance: >3 FB Neck ROM: Full    Dental  (+) Missing   Pulmonary Current Smoker and Patient abstained from smoking.,    Pulmonary exam normal breath sounds clear to auscultation       Cardiovascular Exercise Tolerance: Good Normal cardiovascular exam Rhythm:Regular Rate:Normal     Neuro/Psych  Headaches, Seizures -,   Neuromuscular disease    GI/Hepatic Neg liver ROS, GERD  Medicated,  Endo/Other  negative endocrine ROS  Renal/GU negative Renal ROS     Musculoskeletal  (+) Arthritis  (scoliosis), Fibromyalgia -  Abdominal   Peds  Hematology   Anesthesia Other Findings   Reproductive/Obstetrics                             Anesthesia Physical Anesthesia Plan  ASA: III  Anesthesia Plan: General   Post-op Pain Management:    Induction: Intravenous  PONV Risk Score and Plan: 2  Airway Management Planned: Natural Airway, Nasal Cannula and Simple Face Mask  Additional Equipment:   Intra-op Plan:   Post-operative Plan: Possible Post-op intubation/ventilation  Informed Consent: I have reviewed the patients History and Physical, chart, labs and discussed the procedure including the risks, benefits and alternatives for the proposed anesthesia with the patient or authorized representative who has indicated his/her understanding and acceptance.       Plan Discussed with:   Anesthesia Plan Comments:         Anesthesia Quick Evaluation

## 2019-05-15 NOTE — ED Provider Notes (Signed)
[facility] EMERGENCY DEPARTMENT Provider Note   CSN: [id number] Arrival date & time: 05/14/19  1535     History   Chief Complaint Chief Complaint  Patient presents with   Hematemesis    HPI Sharon Welch is a 48 y.o. female.     The history is provided by the patient.  Emesis Severity:  Severe Timing:  Intermittent Quality:  Bright red blood and coffee grounds Progression:  Worsening Chronicity:  New Relieved by:  None tried Worsened by:  Nothing Associated symptoms: abdominal pain   Associated symptoms: no fever   Risk factors: no alcohol use   Patient with history of chronic pain presents with hematemesis.  She reports earlier today she began vomiting blood.  She also reports vomiting of black substance.  She reports abdominal pain.  Also reports dark stool.  No fevers.  No cough or shortness of breath.  She does not recall having this previously. 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   Bilateral occipital neuralgia 01/02/2015   Migraine 01/02/2015   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/2014   DDD (degenerative disc disease), cervical 10/27/2014   DDD (degenerative disc disease), 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   DDD (degenerative disc disease), lumbosacral 10/27/2014   Partial tear subscapularis tendon 12/27/2011    Past Surgical History:  Procedure Laterality Date   ABDOMINAL HYSTERECTOMY  2012   [place]   scoliosis throsic-rods    CESAREAN SECTION     CHOLECYSTECTOMY N/A 11/17/2012   Procedure: LAPAROSCOPIC CHOLECYSTECTOMY WITH INTRAOPERATIVE CHOLANGIOGRAM;  Surgeon: Ralene Ok, MD;  Location: [place];  Service: General;  Laterality: N/A;   ERCP N/A 05/07/2013   Procedure: ENDOSCOPIC RETROGRADE CHOLANGIOPANCREATOGRAPHY (ERCP);  Surgeon: Beryle Beams, MD;  Location: [facility];  Service: Endoscopy;  Laterality: N/A;  start ercp first, if canulation fails switch to EUS    EUS N/A 11/03/2012   Procedure: UPPER ENDOSCOPIC ULTRASOUND (EUS) LINEAR;  Surgeon: Beryle Beams, MD;  Location: [facility];  Service: Endoscopy;  Laterality: N/A;   left arm     NECK SURGERY     TUBAL LIGATION       OB History    Gravida      Para      Term      Preterm      AB      Living  2     SAB      TAB      Ectopic      Multiple      Live Births               Home Medications    Prior to Admission medications   Medication Sig Start Date End Date Taking? Authorizing Provider  HYDROcodone-acetaminophen (NORCO) 10-325 MG tablet Take 1 tablet by mouth every 4 (four) hours as needed for moderate pain or severe pain.   Yes [provider]  levETIRAcetam (KEPPRA) 500 MG tablet Take 1 tablet (500 mg total) by mouth 2 (two) times daily. 05/07/19  Yes Nat Christen, MD  Milnacipran (SAVELLA) 50 MG TABS tablet Take 50 mg by mouth 2 (two) times daily.   Yes [provider]  tiZANidine (ZANAFLEX) 2 MG tablet Take 2 mg by mouth every 6 (six) hours as needed for muscle spasms.   Yes [provider]    Family History Family History  Problem Relation Age of Onset   Hypertension Mother    Hypertension Father    Cancer Father        prostate   Heart disease Father    Hypertension Brother    Hypertension Brother     Social History Social History   Tobacco Use   Smoking status: Current Every Day Smoker    Packs/day: 0.50    Years: 20.00    Pack years: 10.00    Types:  Cigarettes   Smokeless tobacco: Never Used  Substance Use Topics   Alcohol use: No    Alcohol/week: 0.0 standard drinks    Comment: 1-2 sips a years   Drug use: No     Allergies   Tramadol   Review of Systems Review of Systems  Constitutional: Negative for fever.  Cardiovascular: Negative for chest pain.  Gastrointestinal: Positive for abdominal pain, blood in stool and vomiting.  All other systems reviewed and are negative.    Physical Exam Updated Vital Signs BP 112/78    Pulse (!) 113    Temp 98.6 F (37 C) (Oral)    Resp 18    Ht 1.524 m (5')    Wt 42.2 kg    SpO2 99%    BMI 18.16 kg/m   Physical Exam CONSTITUTIONAL: Frail, ill-appearing HEAD: Normocephalic/atraumatic EYES: EOMI/PERRL, no icterus ENMT: Mucous membranes dry NECK: supple no meningeal signs SPINE/BACK:entire spine nontender CV: S1/S2 noted, no murmurs/rubs/gallops noted, tachycardic LUNGS: Lungs are clear to auscultation bilaterally, no apparent distress ABDOMEN: soft, nontender, no rebound or guarding, bowel sounds noted throughout abdomen Stool is black, Hemoccult positive GU:no cva tenderness NEURO: Pt is awake/alert/appropriate, moves all extremitiesx4.  No facial droop.   EXTREMITIES: pulses normal/equal, full ROM SKIN: warm, color normal PSYCH: no abnormalities of mood noted, alert and oriented to situation   ED Treatments / Results  Labs (all labs ordered are listed, but only abnormal results are displayed) Labs Reviewed  COMPREHENSIVE METABOLIC PANEL - Abnormal; Notable for the following components:      Result Value   Glucose, Bld 134 (*)    BUN 49 (*)    AST 61 (*)    ALT 133 (*)    Alkaline Phosphatase 549 (*)    All other components within normal limits  CBC - Abnormal; Notable for the following components:   WBC 19.6 (*)    Platelets 455 (*)    All other components within normal limits  CBC - Abnormal; Notable for the following components:   WBC 26.0 (*)    All other  components within normal limits  POC OCCULT BLOOD, ED - Abnormal; Notable for the following components:   Fecal Occult Bld POSITIVE (*)    All other components within normal limits  SARS CORONAVIRUS 2 (TAT 6-24 HRS)  LIPASE, BLOOD  PROTIME-INR  TYPE AND SCREEN    EKG EKG Interpretation  Date/Time:  Friday May 14 2019 15:50:30 EST Ventricular Rate:  125 PR Interval:  136 QRS Duration: 78 QT Interval:  316 QTC  Calculation: 456 R Axis:   -71 Text Interpretation: Sinus tachycardia Biatrial enlargement Pulmonary disease pattern Left anterior fascicular block Possible Inferior infarct , age undetermined Abnormal ECG Since last tracing rate faster Otherwise no significant change Confirmed by Daleen Bo [phone]) on 05/14/2019 4:10:01 PM   Radiology Ct Abdomen Pelvis W Contrast  Result Date: 05/15/2019 CLINICAL DATA:  Acute generalized abdominal pain. Patient reports vomiting blood. EXAM: CT ABDOMEN AND PELVIS WITH CONTRAST TECHNIQUE: Multidetector CT imaging of the abdomen and pelvis was performed using the standard protocol following bolus administration of intravenous contrast. CONTRAST:  141mL OMNIPAQUE IOHEXOL 300 MG/ML  SOLN COMPARISON:  CT 05/08/2013 FINDINGS: Lower chest: Emphysema. No acute airspace disease or pleural fluid. Hepatobiliary: 7 mm cyst in the right lobe of the liver. Additional tiny hepatic hypodensities that are too small to characterize. Postcholecystectomy. Common bile duct is dilated at 13 mm. Mild central intrahepatic biliary ductal dilatation. No visualized choledocholithiasis. Pancreas: No ductal dilatation or inflammation. Spleen: Normal in size without focal abnormality. Heterogeneity on initial imaging normal for phase of IV contrast. Adrenals/Urinary Tract: No adrenal nodule. No hydronephrosis or perinephric edema. Small cyst in the left mid kidney. Urinary bladder is partially distended, questionable mild bladder wall thickening. Stomach/Bowel: Bowel  evaluation is limited in the absence of enteric contrast and paucity of intra-abdominal fat. Peri pyloric gastric wall thickening versus peristalsis. Fluid-filled small bowel without obstruction. Questionable mild wall hyperemia of pelvic small bowel loops. Normal appendix. Mild colonic wall thickening of sigmoid colon. Vascular/Lymphatic: Abdominal aorta is normal in caliber. The portal vein is patent. No adenopathy in the abdomen or pelvis. Reproductive: Status post hysterectomy. No adnexal masses. Other: No free air, free fluid, or intra-abdominal fluid collection. Musculoskeletal: Scoliosis with postsurgical change of the thoracolumbar spine. There are no acute or suspicious osseous abnormalities. IMPRESSION: 1. Postcholecystectomy with intra and extrahepatic biliary ductal dilatation. No visualized choledocholithiasis. Recommend correlation with LFTs. If LFTs are elevated, recommend further evaluation with MRCP or ERCP. 2. Mild small bowel hyperemia of pelvic bowel loops, with mild adjacent sigmoid colonic wall thickening. Findings suggest mild generalized enteritis/colitis. Aortic Atherosclerosis (ICD10-I70.0) and Emphysema (ICD10-J43.9). Electronically Signed   By: Keith Rake M.D.   On: 05/15/2019 01:10    Procedures .Critical Care Performed by: Ripley Fraise, MD Authorized by: Ripley Fraise, MD   Critical care provider statement:    Critical care time (minutes):  45   Critical care start time:  05/14/2019 11:15 PM   Critical care end time:  05/15/2019 12:00 AM   Critical care time was exclusive of:  Separately billable procedures and treating other patients   Critical care was necessary to treat or prevent imminent or life-threatening deterioration of the following conditions:  Shock and dehydration   Critical care was time spent personally by me on the following activities:  Re-evaluation of patient's condition, pulse oximetry, ordering and review of laboratory studies, evaluation  of patient's response to treatment, development of treatment plan with patient or surrogate and examination of patient   I assumed direction of critical care for this patient from another provider in my specialty: no      Medications Ordered in ED Medications  sodium chloride flush (NS) 0.9 % injection 3 mL (3 mLs Intravenous Given 05/14/19 2034)  ondansetron (ZOFRAN) injection 4 mg (4 mg Intravenous Given 05/14/19 2038)  pantoprazole (PROTONIX) injection 40 mg (40 mg Intravenous Given 05/14/19 2328)  sodium chloride 0.9 % bolus 1,000 mL (1,000 mLs Intravenous New Bag/Given 05/14/19 2328)  fentaNYL (SUBLIMAZE)  injection 50 mcg (50 mcg Intravenous Given 05/14/19 2328)  iohexol (OMNIPAQUE) 300 MG/ML solution 100 mL (100 mLs Intravenous Contrast Given 05/15/19 0039)     Initial Impression / Assessment and Plan / ED Course  I have reviewed the triage vital signs and the nursing notes.  Pertinent labs  results that were available during my care of the patient were reviewed by me and considered in my medical decision making (see chart for details).        12:13 AM Patient presents with hematemesis.  She reports multiple episodes at home as well as dark stool.  Her vomiting has improved, but she does have black stool. No previous history of GI bleeds. Denies alcohol use. At this point she does not require transfusion would need to be closely monitored in the hospital. No focal abdominal tenderness at this time.  Suspect gastritis versus PUD 12:15 AM Due to increasing white blood cell count, will obtain CT imaging of abdomen pelvis.  1:32 AM D/w hosptalist for admission Ct imaging at time of phone call to hospitalist Final Clinical Impressions(s) / ED Diagnoses   Final diagnoses:  Hematemesis with nausea  Dehydration    ED Discharge Orders    None       Ripley Fraise, MD 05/15/19 [phone]

## 2019-05-16 DIAGNOSIS — G43909 Migraine, unspecified, not intractable, without status migrainosus: Secondary | ICD-10-CM

## 2019-05-16 LAB — COMPREHENSIVE METABOLIC PANEL
ALT: 40 U/L (ref 0–44)
AST: 19 U/L (ref 15–41)
Albumin: 2.4 g/dL — ABNORMAL LOW (ref 3.5–5.0)
Alkaline Phosphatase: 189 U/L — ABNORMAL HIGH (ref 38–126)
Anion gap: 7 (ref 5–15)
BUN: 13 mg/dL (ref 6–20)
CO2: 19 mmol/L — ABNORMAL LOW (ref 22–32)
Calcium: 7.3 mg/dL — ABNORMAL LOW (ref 8.9–10.3)
Chloride: 113 mmol/L — ABNORMAL HIGH (ref 98–111)
Creatinine, Ser: 0.57 mg/dL (ref 0.44–1.00)
GFR calc Af Amer: >60 mL/min (ref >60)
GFR calc non Af Amer: >60 mL/min (ref >60)
Glucose, Bld: 82 mg/dL (ref 70–99)
Potassium: 3.7 mmol/L (ref 3.5–5.1)
Sodium: 139 mmol/L (ref 135–145)
Total Bilirubin: 0.7 mg/dL (ref 0.3–1.2)
Total Protein: 4.1 g/dL — ABNORMAL LOW (ref 6.5–8.1)

## 2019-05-16 LAB — CBC
HCT: 19.7 % — ABNORMAL LOW (ref 36.0–46.0)
Hemoglobin: 6.2 g/dL — CL (ref 12.0–15.0)
MCH: 31.8 pg (ref 26.0–34.0)
MCHC: 31.5 g/dL (ref 30.0–36.0)
MCV: 101 fL — ABNORMAL HIGH (ref 80.0–100.0)
Platelets: 173 10*3/uL (ref 150–400)
RBC: 1.95 MIL/uL — ABNORMAL LOW (ref 3.87–5.11)
RDW: 12.9 % (ref 11.5–15.5)
WBC: 8.9 10*3/uL (ref 4.0–10.5)
nRBC: 0 % (ref 0.0–0.2)

## 2019-05-16 LAB — ABO/RH: ABO/RH(D): B POS

## 2019-05-16 LAB — PREPARE RBC (CROSSMATCH)

## 2019-05-16 MED ORDER — SODIUM CHLORIDE 0.9% IV SOLUTION
Freq: Once | INTRAVENOUS | Status: AC
  Administered 2019-05-16: 18:00:00 via INTRAVENOUS

## 2019-05-16 MED ORDER — SODIUM CHLORIDE 0.9% IV SOLUTION
Freq: Once | INTRAVENOUS | Status: AC
  Administered 2019-05-16: 15:00:00 via INTRAVENOUS

## 2019-05-16 MED ORDER — FUROSEMIDE 10 MG/ML IJ SOLN
40.0000 mg | Freq: Once | INTRAMUSCULAR | Status: AC
  Administered 2019-05-16: 40 mg via INTRAVENOUS
  Filled 2019-05-16: qty 4

## 2019-05-16 MED ORDER — PANTOPRAZOLE SODIUM 40 MG IV SOLR
INTRAVENOUS | Status: AC
  Filled 2019-05-16: qty 80

## 2019-05-16 MED ORDER — SODIUM CHLORIDE 0.9 % IV SOLN
8.0000 mg | Freq: Once | INTRAVENOUS | Status: AC
  Administered 2019-05-16: 8 mg via INTRAVENOUS
  Filled 2019-05-16: qty 4

## 2019-05-16 NOTE — Progress Notes (Signed)
Patient Demographics:    Sharon Welch, is a 48 y.o. female, DOB - [birth year], OTL:[id number]  Admit date - 05/14/2019   Admitting Physician  Denton Brick, MD  Outpatient Primary MD for the patient is [facility] [place]  LOS - 1   Chief Complaint  Patient presents with   Hematemesis        Subjective:    [name] today has no fevers, no emesis,  No chest pain,   -Complains of fatigue and orthostatic dizziness -Hemoglobin down to 6.2, no further hematemesis, no further melena  Assessment  & Plan :    Principal Problem:   Acute GI bleed due to bleeding pyloric/gastric ulcer Active Problems:   PUD (peptic ulcer disease)-EGD 05/15/2019 with bleeding pyloric/gastric ulcer, nonbleeding duodenal ulcers and gastritis   Hematemesis with nausea   Migraine headache  Brief summary 48 year old admitted with hematemesis and melena on 05/15/2019 with acute blood loss anemia and EGD finding on 05/15/2019 of One oozing cratered gastric ulcer with a visible vessel was found at the pylorus, treated as well as gastritis and nonbleeding duodenal ulcers  A/p 1)Acute GI bleed--- patient admitted with melena and hematemesis in the setting of Aleve use--- discussed with Dr. Oneida Alar from GI service, continue IV Protonix  -EGD on 05/15/2019 with bleeding pyloric channel ulcer, as well as nonbleeding duodenal ulcers and gastritis  2)Acute  symptomatic anemia secondary to acute blood loss in the setting of GI bleed---  -Patient with orthostatic dizziness and significant fatigue, --Hemoglobin was 15.0 on 05/07/2019, hemoglobin on admission on 05/14/2019 was 13.3, repeat hemoglobin down to 6.2 Risk, benefits and alternatives to transfusion of blood products discussed. Indication for transfusion discussed. Consent obtained Please Transfuse 2 units of packed red blood cells, please give each unit of PRBC over 3  hours, please give Lasix 40 mg IV x1 after first unit of packed cells is infused  - 3) enteritis/colitis--- CT abdomen and pelvis report noted, -Abdominal pain improving, continue Cipro and Flagyl  4) elevated LFTs--- status post prior cholecystectomy with the in 2014 without abnormal findings at that time- -MRCP scheduled for 05/17/2019 -ALT is down to 40 from 207 on 05/07/2019, AST is down to 19 from 2003 on 05/07/2019, alk phos is down to 189 from 549 on 05/14/2019, T bili is 0.7 -Work-up for possible autoimmune and other etiologies of hepatitis pending -Viral hepatitis profile negative including hep A, hep B and hep C  5)FEN-hypomagnesemia/hypokalemia/hypocalcemia--suspect due to GI losses and poor oral intake, replace and recheck  6) chronic pain syndrome--- PTA patient was on opiates  Lactic acidosis---  resolved with hydration    Disposition/Need for in-Hospital Stay- patient unable to be discharged at this time due to --symptomatic anemia secondary to acute GI bleed requiring transfusion and hydration*  Code Status : Full   Family Communication:   NA (patient is alert, awake and coherent)   Disposition Plan  : TBD  Consults  :  Gi  DVT Prophylaxis  :    - SCDs  Lab Results  Component Value Date   PLT 173 05/16/2019    Inpatient Medications  Scheduled Meds:  influenza vac split quadrivalent PF  0.5 mL Intramuscular Tomorrow-1000   levETIRAcetam  500 mg Oral BID   Milnacipran  50 mg Oral BID   nicotine  14 mg Transdermal Daily   [START ON 05/19/2019] pantoprazole  40 mg Intravenous Q12H   tiZANidine  2 mg Oral Q8H   Continuous Infusions:  sodium chloride 100 mL/hr at 05/16/19 0159   ciprofloxacin 400 mg (05/16/19 0315)   metronidazole 500 mg (05/16/19 0427)   pantoprozole (PROTONIX) infusion 8 mg/hr (05/16/19 0158)   PRN Meds:.albuterol, ondansetron **OR** ondansetron (ZOFRAN) IV, oxyCODONE   Anti-infectives (From admission, onward)    Start     Dose/Rate Route Frequency Ordered Stop   05/15/19 0500  metroNIDAZOLE (FLAGYL) IVPB 500 mg     500 mg 100 mL/hr over 60 Minutes Intravenous Every 8 hours 05/15/19 0414     05/15/19 0415  ciprofloxacin (CIPRO) IVPB 400 mg     400 mg 200 mL/hr over 60 Minutes Intravenous Every 12 hours 05/15/19 0414          Objective:   Vitals:   05/15/19 1430 05/15/19 1449 05/15/19 2135 05/16/19 0601  BP: (!) 160/79 (!) 142/87 (!) 83/48 (!) 84/42  Pulse: 87 85 80 78  Resp: _0 Temp:  98.2 F (36.8 C) 98.6 F (37 C) 98.7 F (37.1 C)  TempSrc:  Oral Oral Oral  SpO2: 100% 100% 100% 100%  Weight:      Height:        Wt Readings from Last 3 Encounters:  05/14/19 42.2 kg  05/07/19 42.4 kg  09/17/18 47.2 kg     Intake/Output Summary (Last 24 hours) at 05/16/2019 0910 Last data filed at 05/16/2019 0400 Gross per 24 hour  Intake 3812.02 ml  Output --  Net 3812.02 ml     Physical Exam  Gen:- Awake Alert, in no acute distress HEENT:- [place].AT, No sclera icterus Neck-Supple Neck,No JVD,.  Lungs-  CTAB , fair symmetrical air movement CV- S1, S2 normal, regular, patient with dizziness Abd-  +ve B.Sounds, Abd Soft, epigastric tenderness but rebound or guarding,    Extremity/Skin:- No  edema, pedal pulses present  Psych-affect is appropriate, oriented x3 Neuro-generalized weakness/fatigue, no new focal deficits, no tremors   Data Review:   Micro Results Recent Results (from the past 240 hour(s))  SARS Coronavirus 2 by RT PCR (hospital order, performed in [facility] hospital lab) Nasopharyngeal Nasopharyngeal Swab     Status: None   Collection Time: 05/15/19 12:02 AM   Specimen: Nasopharyngeal Swab  Result Value Ref Range Status   SARS Coronavirus 2 NEGATIVE NEGATIVE Final    Comment: (NOTE) If result is NEGATIVE SARS-CoV-2 target nucleic acids are NOT DETECTED. The SARS-CoV-2 RNA is generally detectable in upper and lower  respiratory specimens during the acute  phase of infection. The lowest  concentration of SARS-CoV-2 viral copies this assay can detect is 250  copies / mL. A negative result does not preclude SARS-CoV-2 infection  and should not be used as the sole basis for treatment or other  patient management decisions.  A negative result may occur with  improper specimen collection / handling, submission of specimen other  than nasopharyngeal swab, presence of viral mutation(s) within the  areas targeted by this assay, and inadequate number of viral copies  (<250 copies / mL). A negative result must be  combined with clinical  observations, patient history, and epidemiological information. If result is POSITIVE SARS-CoV-2 target nucleic acids are DETECTED. The SARS-CoV-2 RNA is generally detectable in upper and lower  respiratory specimens dur ing the acute phase of infection.  Positive  results are indicative of active infection with SARS-CoV-2.  Clinical  correlation with patient history and other diagnostic information is  necessary to determine patient infection status.  Positive results do  not rule out bacterial infection or co-infection with other viruses. If result is PRESUMPTIVE POSTIVE SARS-CoV-2 nucleic acids MAY BE PRESENT.   A presumptive positive result was obtained on the submitted specimen  and confirmed on repeat testing.  While 2019 novel coronavirus  (SARS-CoV-2) nucleic acids may be present in the submitted sample  additional confirmatory testing may be necessary for epidemiological  and / or clinical management purposes  to differentiate between  SARS-CoV-2 and other Sarbecovirus currently known to infect humans.  If clinically indicated additional testing with an alternate test  methodology [phone]) is advised. The SARS-CoV-2 RNA is generally  detectable in upper and lower respiratory sp ecimens during the acute  phase of infection. The expected result is Negative. Fact Sheet for Patients:   StrictlyIdeas.no Fact Sheet for Healthcare Providers: BankingDealers.co.za This test is not yet approved or cleared by the [place] FDA and has been authorized for detection and/or diagnosis of SARS-CoV-2 by FDA under an Emergency Use Authorization (EUA).  This EUA will remain in effect (meaning this test can be used) for the duration of the COVID-19 declaration under Section 564(b)(1) of the Act, 21 U.S.C. section 360bbb-3(b)(1), unless the authorization is terminated or revoked sooner. Performed at [facility], [address] [place], [place] [address]     Radiology Reports Ct Head Wo Contrast  Result Date: 05/07/2019 CLINICAL DATA:  Seizure.  Encephalopathy. EXAM: CT HEAD WITHOUT CONTRAST TECHNIQUE: Contiguous axial images were obtained from the base of the skull through the vertex without intravenous contrast. COMPARISON:  March 07, 2017 FINDINGS: Brain: No evidence of acute infarction, hemorrhage, hydrocephalus, extra-axial collection or mass lesion/mass effect. Vascular: No hyperdense vessel noted. Skull: Normal. Negative for fracture or focal lesion. Sinuses/Orbits: No acute finding. Other: None. IMPRESSION: No focal acute intracranial abnormality identified. Electronically Signed   By: Abelardo Diesel M.D.   On: 05/07/2019 14:30   Ct Abdomen Pelvis W Contrast  Result Date: 05/15/2019 CLINICAL DATA:  Acute generalized abdominal pain. Patient reports vomiting blood. EXAM: CT ABDOMEN AND PELVIS WITH CONTRAST TECHNIQUE: Multidetector CT imaging of the abdomen and pelvis was performed using the standard protocol following bolus administration of intravenous contrast. CONTRAST:  132m OMNIPAQUE IOHEXOL 300 MG/ML  SOLN COMPARISON:  CT 05/08/2013 FINDINGS: Lower chest: Emphysema. No acute airspace disease or pleural fluid. Hepatobiliary: 7 mm cyst in the right lobe of the liver. Additional tiny hepatic hypodensities that are too small to  characterize. Postcholecystectomy. Common bile duct is dilated at 13 mm. Mild central intrahepatic biliary ductal dilatation. No visualized choledocholithiasis. Pancreas: No ductal dilatation or inflammation. Spleen: Normal in size without focal abnormality. Heterogeneity on initial imaging normal for phase of IV contrast. Adrenals/Urinary Tract: No adrenal nodule. No hydronephrosis or perinephric edema. Small cyst in the left mid kidney. Urinary bladder is partially distended, questionable mild bladder wall thickening. Stomach/Bowel: Bowel evaluation is limited in the absence of enteric contrast and paucity of intra-abdominal fat. Peri pyloric gastric wall thickening versus peristalsis. Fluid-filled small bowel without obstruction. Questionable mild wall hyperemia of pelvic small bowel loops. Normal appendix.  Mild colonic wall thickening of sigmoid colon. Vascular/Lymphatic: Abdominal aorta is normal in caliber. The portal vein is patent. No adenopathy in the abdomen or pelvis. Reproductive: Status post hysterectomy. No adnexal masses. Other: No free air, free fluid, or intra-abdominal fluid collection. Musculoskeletal: Scoliosis with postsurgical change of the thoracolumbar spine. There are no acute or suspicious osseous abnormalities. IMPRESSION: 1. Postcholecystectomy with intra and extrahepatic biliary ductal dilatation. No visualized choledocholithiasis. Recommend correlation with LFTs. If LFTs are elevated, recommend further evaluation with MRCP or ERCP. 2. Mild small bowel hyperemia of pelvic bowel loops, with mild adjacent sigmoid colonic wall thickening. Findings suggest mild generalized enteritis/colitis. Aortic Atherosclerosis (ICD10-I70.0) and Emphysema (ICD10-J43.9). Electronically Signed   By: Keith Rake M.D.   On: 05/15/2019 01:10     CBC Recent Labs  Lab 05/14/19 1955 05/14/19 2354 05/15/19 0846 05/15/19 1545 05/16/19 0710  WBC 19.6* 26.0* 20.4* 16.5* 8.9  HGB 13.3 12.7 10.9* 9.6*  6.2*  HCT 40.0 37.2 33.8* 30.0* 19.7*  PLT 455* 308 366 296 173  MCV 97.3 94.2 101.2* 100.7* 101.0*  MCH 32.4 32.2 32.6 32.2 31.8  MCHC 33.3 34.1 32.2 32.0 31.5  RDW 12.7 12.5 12.9 12.9 12.9    Chemistries  Recent Labs  Lab 05/14/19 1955 05/15/19 0357 05/15/19 1545 05/16/19 0710  NA 139 139 140 139  K 4.3 3.3* 4.4 3.7  CL 106 120* 113* 113*  CO2 22 15* 19* 19*  GLUCOSE 134* 76 98 82  BUN 49* 30* 18 13  CREATININE 0.76 0.49 0.61 0.57  CALCIUM 9.0 5.9* 7.8* 7.3*  MG  --  1.6*  --   --   AST 61* 29  --  19  ALT 133* 71*  --  40  ALKPHOS 549* 301*  --  189*  BILITOT 0.6 0.6  --  0.7   ------------------------------------------------------------------------------------------------------------------ No results for input(s): CHOL, HDL, LDLCALC, TRIG, CHOLHDL, LDLDIRECT in the last 72 hours.  Lab Results  Component Value Date   HGBA1C 5.2 05/15/2019   ------------------------------------------------------------------------------------------------------------------ No results for input(s): TSH, T4TOTAL, T3FREE, THYROIDAB in the last 72 hours.  Invalid input(s): FREET3 ------------------------------------------------------------------------------------------------------------------ No results for input(s): VITAMINB12, FOLATE, FERRITIN, TIBC, IRON, RETICCTPCT in the last 72 hours.  Coagulation profile Recent Labs  Lab 05/14/19 1955  INR 1.0    No results for input(s): DDIMER in the last 72 hours.  Cardiac Enzymes No results for input(s): CKMB, TROPONINI, MYOGLOBIN in the last 168 hours.  Invalid input(s): CK ------------------------------------------------------------------------------------------------------------------ No results found for: BNP   Roxan Hockey M.D on 05/16/2019 at 9:10 AM  Go to [email or web address] - for contact info  [facility] - Office  [phone]

## 2019-05-16 NOTE — Progress Notes (Signed)
CRITICAL VALUE ALERT  Critical Value:  Hemoglobin 6.2  Date & Time Notied:  05/16/2019, 0910  Provider Notified: Dr. Joesph Fillers  Orders Received/Actions taken: PRBC to be administered to patient.

## 2019-05-16 NOTE — Progress Notes (Signed)
   Assessment/Plan: ADMITTED WITH UGI BLEED DUE TO PYLORIC CHANNEL ULCER. NO HEMATEMESIS, BRBPR OR MELENA. Hb DROPPED TO 6.2 LIKELY DUE TO EQUILIBRATION.  PLAN: 1. TRANSFUSE TO KEEP Hb > 7. 2. CONTINUE PROTONIX gtt. 3. ADD ZOFRAN 8 MG IV x1 AND PERCOCET x1 FOR MIGRAINE.    Subjective: Since I last evaluated the patient PT DENIES HEMATEMESIS, BRBPR OR MELENA. C/O NAUSEA AND A MIGRAINE. USUALLY TAKE SUMATRIPTAN BUT PT MAKES HER HAVE CHEST PAIN.   Objective: Vital signs in last 24 hours: Vitals:   05/15/19 2135 05/16/19 0601  BP: (!) 83/48 (!) 84/42  Pulse: 80 78  Resp: 20 20  Temp: 98.6 F (37 C) 98.7 F (37.1 C)  SpO2: 100% 100%   General appearance: alert, cooperative and mild distress Resp: clear to auscultation bilaterally Cardio: regular rate and rhythm GI: soft, non-tender; bowel sounds normal;   Lab Results:   WBC 8.9 Hb 6.2 Cr 0.57   Studies/Results: No results found.  Medications: I have reviewed the patient's current medications.

## 2019-05-17 ENCOUNTER — Encounter (HOSPITAL_COMMUNITY): Payer: Self-pay | Admitting: Gastroenterology

## 2019-05-17 ENCOUNTER — Inpatient Hospital Stay (HOSPITAL_COMMUNITY): Payer: Medicare Other

## 2019-05-17 DIAGNOSIS — K279 Peptic ulcer, site unspecified, unspecified as acute or chronic, without hemorrhage or perforation: Secondary | ICD-10-CM

## 2019-05-17 LAB — CBC
HCT: 33.7 % — ABNORMAL LOW (ref 36.0–46.0)
Hemoglobin: 11.8 g/dL — ABNORMAL LOW (ref 12.0–15.0)
MCH: 31.5 pg (ref 26.0–34.0)
MCHC: 35 g/dL (ref 30.0–36.0)
MCV: 89.9 fL (ref 80.0–100.0)
Platelets: 198 10*3/uL (ref 150–400)
RBC: 3.75 MIL/uL — ABNORMAL LOW (ref 3.87–5.11)
RDW: 13.8 % (ref 11.5–15.5)
WBC: 8 10*3/uL (ref 4.0–10.5)
nRBC: 0.2 % (ref 0.0–0.2)

## 2019-05-17 LAB — URINE CULTURE: Culture: 40000 — AB

## 2019-05-17 LAB — BPAM RBC
Blood Product Expiration Date: 202012172359
Blood Product Expiration Date: 202012172359
ISSUE DATE / TIME: 202011221527
ISSUE DATE / TIME: 202011222332
Unit Type and Rh: 1700
Unit Type and Rh: 1700

## 2019-05-17 LAB — TYPE AND SCREEN
ABO/RH(D): B POS
Antibody Screen: NEGATIVE
Unit division: 0
Unit division: 0

## 2019-05-17 LAB — PTH, INTACT AND CALCIUM
Calcium, Total (PTH): 5.1 mg/dL — CL (ref 8.7–10.2)
PTH: 24 pg/mL (ref 15–65)

## 2019-05-17 MED ORDER — TRAZODONE HCL 50 MG PO TABS
50.0000 mg | ORAL_TABLET | Freq: Once | ORAL | Status: AC
  Administered 2019-05-17: 50 mg via ORAL
  Filled 2019-05-17: qty 1

## 2019-05-17 MED ORDER — ALPRAZOLAM 0.5 MG PO TABS: 0.5000 mg | ORAL_TABLET | Freq: Every evening | ORAL | Status: DC | PRN

## 2019-05-17 MED ORDER — GADOBUTROL 1 MMOL/ML IV SOLN
4.0000 mL | Freq: Once | INTRAVENOUS | Status: AC | PRN
  Administered 2019-05-17: 4 mL via INTRAVENOUS

## 2019-05-17 MED ORDER — ALPRAZOLAM 1 MG PO TABS
1.0000 mg | ORAL_TABLET | Freq: Every evening | ORAL | Status: DC | PRN
  Administered 2019-05-18: 1 mg via ORAL
  Filled 2019-05-17: qty 1

## 2019-05-17 MED ORDER — ENSURE ENLIVE PO LIQD
237.0000 mL | Freq: Two times a day (BID) | ORAL | Status: DC
  Administered 2019-05-17 – 2019-05-18 (×3): 237 mL via ORAL

## 2019-05-17 MED ORDER — TRAZODONE HCL 50 MG PO TABS
150.0000 mg | ORAL_TABLET | Freq: Every day | ORAL | Status: DC
  Administered 2019-05-17: 150 mg via ORAL
  Filled 2019-05-17: qty 3

## 2019-05-17 NOTE — Progress Notes (Signed)
Subjective: Feels 100% better today compared to admission. Ate grits for breakfast even though she doesn't like grits, because she wants to go home and ensure tolerating diet. No N/V. No overt GI bleeding.   Objective: Vital signs in last 24 hours: Temp:  [97.9 F (36.6 C)-98.8 F (37.1 C)] 98.4 F (36.9 C) (11/23 0800) Pulse Rate:  [76-93] 87 (11/23 0800) Resp:  [15-16] 16 (11/23 0404) BP: (124-162)/(66-84) 124/66 (11/23 0800) SpO2:  [96 %-100 %] 96 % (11/23 0800) Last BM Date: 05/16/19 General:   Alert and oriented, pleasant Head:  Normocephalic and atraumatic. Abdomen:  Bowel sounds present, soft, non-tender, non-distended. No HSM or hernias noted. No rebound or guarding. No masses appreciated  Extremities:  Without edema. Neurologic:  Alert and  oriented Psych:  Normal mood and affect.  Intake/Output from previous day: 11/22 0701 - 11/23 0700 In: 3738.2 [P.O.:700; I.V.:1982.8; Blood:335; IV Piggyback:720.3] Out: 2950 [Urine:2950] Intake/Output this shift: No intake/output data recorded.  Lab Results: Recent Labs    05/15/19 1545 05/16/19 0710 05/17/19 0841  WBC 16.5* 8.9 8.0  HGB 9.6* 6.2* 11.8*  HCT 30.0* 19.7* 33.7*  PLT 296 173 198   BMET Recent Labs    05/15/19 0357 05/15/19 0522 05/15/19 1545 05/16/19 0710  NA 139  --  140 139  K 3.3*  --  4.4 3.7  CL 120*  --  113* 113*  CO2 15*  --  19* 19*  GLUCOSE 76  --  98 82  BUN 30*  --  18 13  CREATININE 0.49  --  0.61 0.57  CALCIUM 5.9* 5.1* 7.8* 7.3*   LFT Recent Labs    05/14/19 1955 05/15/19 0357 05/15/19 1545 05/16/19 0710  PROT 7.2 4.5*  --  4.1*  ALBUMIN 4.0 2.5* 3.2* 2.4*  AST 61* 29  --  19  ALT 133* 71*  --  40  ALKPHOS 549* 301*  --  189*  BILITOT 0.6 0.6  --  0.7   PT/INR Recent Labs    05/14/19 1955  LABPROT 13.0  INR 1.0   Hepatitis Panel Recent Labs    05/15/19 0357  HEPBSAG NON REACTIVE  HCVAB NON REACTIVE  HEPAIGM NON REACTIVE  HEPBIGM NON REACTIVE     Assessment: 48 year old female admitted with acute blood loss anemia due to UGI bleed, s/p EGD on 05/15/2019 with  pyloric channel ulcer and visible vessel s/p epi for hemostasis and clip placement. Mild gastritis s/p biopsy and non-bleeding duodenal ulcers without stigmata of bleeding. Protonix drip for 72 hours with start on 11/21 and may be discontinued 11/24 afternoon with transition to BID PPI. She is doing well today without overt GI bleeding, tolerating full liquids, and eager to go home when appropriate. Drop in Hgb to 6.2 yesterday, s/p 2 units with appropriate response to 11.8 today.   Elevated LFTs: with mixed pattern and CT noting postcholecystectomy state and intra/extrahepatic biliary ductal dilatation, no choledocholithiasis. Unknown recent baseline. Transaminases have returned to normal, with improvement in alk phos since admission. MRCP today. AMA, ASMA, IgG in process. Acute hepatitis panel negative.   Mild enteritis/colitis: noted on CT. Empirically on Cipro and Flagyl.   Plan: Continue Protonix infusion through tomorrow, then may start PPI BID  Follow LFTs MRI/MRCP today: pending Will need EGD in 3 months Follow-up on pending ANA, ASMA, IgG Advance to soft diet Cipro and Flagyl for 5-7 days  Annitta Needs, PhD, ANP-BC [facility]    LOS: 2  days    05/17/2019, 11:08 AM

## 2019-05-17 NOTE — Progress Notes (Signed)
MRCP reviewed. Moderate intra and extrahepatic biliary ductal dilatation with amorphous filling defect in distal common bile ducts concerning for obstructive choledocholithiasis or retained debris. Previously underwent ERCP by Dr. Benson Norway in 2014.   I contacted Dr. Ulyses Amor office. They are able to see her as outpatient as early as Monday; however, they would not finalize appointment until patient called their office first due to prior billing concerns. I contacted patient and informed. She was provided the number for the office.

## 2019-05-17 NOTE — Progress Notes (Signed)
Initial Nutrition Assessment  DOCUMENTATION CODES:   Underweight  INTERVENTION:  Ensure Enlive po BID, each supplement provides 350 kcal and 20 grams of protein   Obtain re-weight please.  NUTRITION DIAGNOSIS:  Increased nutrient needs related to acute illness, vomiting(vomiting blood) as evidenced by per patient/family report, estimated needs.  GOAL:  Patient will meet greater than or equal to 90% of their needs   MONITOR:  PO intake, Supplement acceptance, Labs, Weight trends   REASON FOR ASSESSMENT:  Malnutrition Screening Tool   ASSESSMENT: Patient is an underweight 48 yo female who presents with upper gi bleed. Vomiting blood. Hx of chronic back pain, DDD, Fibromyalgia. Daily tobacco usage. Abnormal LFT's.  11/23-MRCP abdomen - findings are concerning for obstructive choledocholithiasis or retained debris.  11/21-EGD -upper pyloric channel ulcer, mild gastritis, non-bleeding duodenal ulcers.  Diet advanced to full liquids. Patient denies recent changes in appetite or intake. She has 4 teenagers at home and says she eats well/regularly but they have non-traditional meal times.   Weight has decreased 11% (5 kg) since March. Patient related wt loss to her UGIB. Will request re-weight now that she has received 2 units PRBC's. Suspect pt is not eating as well as "she thinks" she is. Will follow intake and re-assess for malnutrition as care progresses.  Medications reviewed and include: Protonix, Nicoderm -IV antibiotics cipro and flagyl -IV fluids- NS @ 100 ml/hr  Labs reviewed:  BMP Latest Ref Rng & Units 05/16/2019 05/15/2019 05/15/2019  Glucose 70 - 99 mg/dL 82 98 -  BUN 6 - 20 mg/dL 13 18 -  Creatinine 0.44 - 1.00 mg/dL 0.57 0.61 -  Sodium 135 - 145 mmol/L 139 140 -  Potassium 3.5 - 5.1 mmol/L 3.7 4.4 -  Chloride 98 - 111 mmol/L 113(H) 113(H) -  CO2 22 - 32 mmol/L 19(L) 19(L) -  Calcium 8.9 - 10.3 mg/dL 7.3(L) 7.8(L) 5.1(LL)     NUTRITION - FOCUSED PHYSICAL  EXAM: Deferred today.   Diet Order:   Diet Order            DIET SOFT Room service appropriate? Yes; Fluid consistency: Thin  Diet effective now             EDUCATION NEEDS:   Education needs have been addressed Skin:  Skin Assessment: Reviewed RN Assessment  Last BM:  11/22  Height:   Ht Readings from Last 1 Encounters:  05/14/19 5' (1.524 m)    Weight:   Wt Readings from Last 1 Encounters:  05/14/19 42.2 kg    Ideal Body Weight:  45 kg  BMI:  Body mass index is 18.16 kg/m.  Estimated Nutritional Needs:   Kcal:  [id number] (33-38 kcal/kg/bw)  Protein:  72-76 gr (1.7-1.8 gr/kg/bw)  Fluid:  >1200 ml daily   Colman Cater MS,RD,CSG,LDN Office: [phone] Pager: [phone]

## 2019-05-17 NOTE — Progress Notes (Signed)
Patient Demographics:    Sharon Welch, is a 48 y.o. female, DOB - [birth year], [id number]  Admit date - 05/14/2019   Admitting Physician Valita Righter Denton Brick, MD  Outpatient Primary MD for the patient is [place], [place]  LOS - 2   Chief Complaint  Patient presents with   Hematemesis        Subjective:    [name] today has no fevers, no emesis,  No chest pain,    -No significant dizziness at this time, fatigue persist  Assessment  & Plan :    Principal Problem:   Acute GI bleed due to bleeding pyloric/gastric ulcer Active Problems:   PUD (peptic ulcer disease)-EGD 05/15/2019 with bleeding pyloric/gastric ulcer, nonbleeding duodenal ulcers and gastritis   Hematemesis with nausea   Migraine headache  Brief summary 48 year old admitted with hematemesis and melena on 05/15/2019 with acute blood loss anemia and EGD finding on 05/15/2019 of One oozing cratered gastric ulcer with a visible vessel was found at the pylorus, treated as well as gastritis and nonbleeding duodenal ulcers  A/p 1)Acute GI bleed--- patient admitted with melena and hematemesis in the setting of Aleve use--- discussed with Dr. Oneida Alar from GI service, continue continuous IV Protonix for 72 hours postprocedure through 05/18/2019 -EGD on 05/15/2019 with bleeding pyloric channel ulcer, as well as nonbleeding duodenal ulcers and gastritis  2)Acute  symptomatic anemia secondary to acute blood loss in the setting of GI bleed---  -Patient with orthostatic dizziness and significant fatigue, --Hemoglobin was 15.0 on 05/07/2019, hemoglobin on admission on 05/14/2019 was 13.3, repeat hemoglobin up to 11.8 from 6.2 after transfusion of 2 units of packed cells   - 3) enteritis/colitis--- CT abdomen and pelvis report noted, -Abdominal pain improving, continue Cipro and Flagyl  4) elevated LFTs--- status post prior  cholecystectomy with the in 2014 without abnormal findings at that time- -MRCP on 05/17/2019 -showed moderate intra and extrahepatic biliary ductal dilatation with amorphous filling defect in distal common bile ducts concerning for obstructive choledocholithiasis or retained debris. -Patient will need to follow-up with Dr. Almyra Free for outpatient ERCP -LFTs noted -Work-up for possible autoimmune and other etiologies of hepatitis pending -Viral hepatitis profile negative including hep A, hep B and hep C  5)FEN-hypomagnesemia/hypokalemia/hypocalcemia--suspect due to GI losses and poor oral intake, replace and recheck  6) chronic pain syndrome--- PTA patient was on opiates  Lactic acidosis---  resolved with hydration    Disposition/Need for in-Hospital Stay- patient unable to be discharged at this time due to --- GI service request continue continuous IV Protonix for 72 hours postprocedure through 05/18/2019 due to bleeding ulcer with intervention  Code Status : Full   Family Communication:   NA (patient is alert, awake and coherent)   Disposition Plan  : Home on 05/18/2019 after completion of 24 hours of continuous IV Protonix infusion  Consults  :  Gi  DVT Prophylaxis  :    - SCDs   Lab Results  Component Value Date   PLT 198 05/17/2019    Inpatient Medications  Scheduled Meds:  feeding supplement (ENSURE ENLIVE)  237 mL Oral BID BM   influenza vac split quadrivalent PF  0.5 mL Intramuscular Tomorrow-1000   levETIRAcetam  500 mg Oral BID   Milnacipran  50 mg Oral BID   nicotine  14 mg Transdermal Daily   [START ON 05/19/2019] pantoprazole  40 mg Intravenous Q12H   traZODone  150 mg Oral QHS   Continuous Infusions:  sodium chloride 100 mL/hr at 05/16/19 0159   ciprofloxacin 400 mg (05/17/19 1736)   metronidazole 500 mg (05/17/19 1414)   pantoprozole (PROTONIX) infusion 8 mg/hr (05/17/19 1418)   PRN Meds:.albuterol, ALPRAZolam, ondansetron **OR** ondansetron  (ZOFRAN) IV, oxyCODONE   Anti-infectives (From admission, onward)   Start     Dose/Rate Route Frequency Ordered Stop   05/15/19 0500  metroNIDAZOLE (FLAGYL) IVPB 500 mg     500 mg 100 mL/hr over 60 Minutes Intravenous Every 8 hours 05/15/19 0414     05/15/19 0415  ciprofloxacin (CIPRO) IVPB 400 mg     400 mg 200 mL/hr over 60 Minutes Intravenous Every 12 hours 05/15/19 0414          Objective:   Vitals:   05/17/19 0003 05/17/19 0404 05/17/19 0800 05/17/19 1500  BP: 138/75 (!) 144/81 124/66 130/81  Pulse: 85 92 87 81  Resp: 16 16    Temp: 98.8 F (37.1 C) 97.9 F (36.6 C) 98.4 F (36.9 C) 98.2 F (36.8 C)  TempSrc: Oral Oral Oral Oral  SpO2: 99% 100% 96% 99%  Weight:      Height:        Wt Readings from Last 3 Encounters:  05/14/19 42.2 kg  05/07/19 42.4 kg  09/17/18 47.2 kg     Intake/Output Summary (Last 24 hours) at 05/17/2019 1907 Last data filed at 05/17/2019 1030 Gross per 24 hour  Intake 1997.1 ml  Output 2150 ml  Net -152.9 ml     Physical Exam  Gen:- Awake Alert, in no acute distress HEENT:- [place].AT, No sclera icterus Neck-Supple Neck,No JVD,.  Lungs-  CTAB , fair symmetrical air movement CV- S1, S2 normal, regular,  Abd-  +ve B.Sounds, Abd Soft, epigastric tenderness but rebound or guarding,    Extremity/Skin:- No  edema, pedal pulses present  Psych-affect is appropriate, oriented x3 Neuro-generalized weakness/fatigue, no new focal deficits, no tremors   Data Review:   Micro Results Recent Results (from the past 240 hour(s))  SARS Coronavirus 2 by RT PCR (hospital order, performed in [facility] hospital lab) Nasopharyngeal Nasopharyngeal Swab     Status: None   Collection Time: 05/15/19 12:02 AM   Specimen: Nasopharyngeal Swab  Result Value Ref Range Status   SARS Coronavirus 2 NEGATIVE NEGATIVE Final    Comment: (NOTE) If result is NEGATIVE SARS-CoV-2 target nucleic acids are NOT DETECTED. The SARS-CoV-2 RNA is generally detectable in  upper and lower  respiratory specimens during the acute phase of infection. The lowest  concentration of SARS-CoV-2 viral copies this assay can detect is 250  copies / mL. A negative result does not preclude SARS-CoV-2 infection  and should not be used as the sole basis for treatment or other  patient management decisions.  A negative result may occur with  improper specimen collection / handling, submission of specimen other  than nasopharyngeal swab, presence of viral mutation(s) within the  areas targeted by this assay, and inadequate number of viral copies  (<250 copies / mL). A negative result must be combined with clinical  observations, patient history, and epidemiological information. If result is POSITIVE SARS-CoV-2 target nucleic acids are DETECTED. The SARS-CoV-2 RNA is generally detectable in upper and lower  respiratory specimens dur ing the acute phase of infection.  Positive  results are indicative of active infection with SARS-CoV-2.  Clinical  correlation with patient history and other diagnostic information is  necessary to determine patient infection status.  Positive results do  not rule out bacterial infection or co-infection with other viruses. If result is PRESUMPTIVE POSTIVE SARS-CoV-2 nucleic acids MAY BE PRESENT.   A presumptive positive result was obtained on the submitted specimen  and confirmed on repeat testing.  While 2019 novel coronavirus  (SARS-CoV-2) nucleic acids may be present in the submitted sample  additional confirmatory testing may be necessary for epidemiological  and / or clinical management purposes  to differentiate between  SARS-CoV-2 and other Sarbecovirus currently known to infect humans.  If clinically indicated additional testing with an alternate test  methodology [phone]) is advised. The SARS-CoV-2 RNA is generally  detectable in upper and lower respiratory sp ecimens during the acute  phase of infection. The expected result is  Negative. Fact Sheet for Patients:  StrictlyIdeas.no Fact Sheet for Healthcare Providers: BankingDealers.co.za This test is not yet approved or cleared by the [place] FDA and has been authorized for detection and/or diagnosis of SARS-CoV-2 by FDA under an Emergency Use Authorization (EUA).  This EUA will remain in effect (meaning this test can be used) for the duration of the COVID-19 declaration under Section 564(b)(1) of the Act, 21 U.S.C. section 360bbb-3(b)(1), unless the authorization is terminated or revoked sooner. Performed at [facility], [address] [place], [place] [address]   Urine culture     Status: Abnormal   Collection Time: 05/15/19  3:05 AM   Specimen: Urine, Random  Result Value Ref Range Status   Specimen Description   Final    URINE, RANDOM Performed at [facility], [address] [place], [place] [address]    Special Requests   Final    NONE Performed at [facility], [address] [place], [place] [address]    Culture (A)  Final    40,000 COLONIES/mL ESCHERICHIA COLI 40,000 COLONIES/mL KLEBSIELLA PNEUMONIAE    Report Status 05/17/2019 FINAL  Final   Organism ID, Bacteria ESCHERICHIA COLI (A)  Final   Organism ID, Bacteria KLEBSIELLA PNEUMONIAE (A)  Final      Susceptibility   Escherichia coli - MIC*    AMPICILLIN 8 SENSITIVE Sensitive     CEFAZOLIN <=4 SENSITIVE Sensitive     CEFTRIAXONE <=1 SENSITIVE Sensitive     CIPROFLOXACIN <=0.25 SENSITIVE Sensitive     GENTAMICIN <=1 SENSITIVE Sensitive     IMIPENEM <=0.25 SENSITIVE Sensitive     NITROFURANTOIN <=16 SENSITIVE Sensitive     TRIMETH/SULFA <=20 SENSITIVE Sensitive     AMPICILLIN/SULBACTAM <=2 SENSITIVE Sensitive     PIP/TAZO <=4 SENSITIVE Sensitive     Extended ESBL NEGATIVE Sensitive     * 40,000 COLONIES/mL ESCHERICHIA COLI   Klebsiella pneumoniae - MIC*    AMPICILLIN RESISTANT Resistant     CEFAZOLIN <=4 SENSITIVE Sensitive      CEFTRIAXONE <=1 SENSITIVE Sensitive     CIPROFLOXACIN <=0.25 SENSITIVE Sensitive     GENTAMICIN <=1 SENSITIVE Sensitive     IMIPENEM <=0.25 SENSITIVE Sensitive     NITROFURANTOIN 64 INTERMEDIATE Intermediate     TRIMETH/SULFA <=20 SENSITIVE Sensitive     AMPICILLIN/SULBACTAM 4 SENSITIVE Sensitive     PIP/TAZO <=4 SENSITIVE Sensitive     Extended ESBL NEGATIVE Sensitive     * 40,000 COLONIES/mL KLEBSIELLA PNEUMONIAE    Radiology Reports Ct Head Wo Contrast  Result Date: 05/07/2019 CLINICAL DATA:  Seizure.  Encephalopathy. EXAM: CT HEAD WITHOUT CONTRAST TECHNIQUE: Contiguous axial images were obtained from the base of the skull through the vertex without intravenous contrast. COMPARISON:  March 07, 2017 FINDINGS: Brain: No evidence of acute infarction, hemorrhage, hydrocephalus, extra-axial collection or mass lesion/mass effect. Vascular: No hyperdense vessel noted. Skull: Normal. Negative for fracture or focal lesion. Sinuses/Orbits: No acute finding. Other: None. IMPRESSION: No focal acute intracranial abnormality identified. Electronically Signed   By: Abelardo Diesel M.D.   On: 05/07/2019 14:30   Ct Abdomen Pelvis W Contrast  Result Date: 05/15/2019 CLINICAL DATA:  Acute generalized abdominal pain. Patient reports vomiting blood. EXAM: CT ABDOMEN AND PELVIS WITH CONTRAST TECHNIQUE: Multidetector CT imaging of the abdomen and pelvis was performed using the standard protocol following bolus administration of intravenous contrast. CONTRAST:  157mL OMNIPAQUE IOHEXOL 300 MG/ML  SOLN COMPARISON:  CT 05/08/2013 FINDINGS: Lower chest: Emphysema. No acute airspace disease or pleural fluid. Hepatobiliary: 7 mm cyst in the right lobe of the liver. Additional tiny hepatic hypodensities that are too small to characterize. Postcholecystectomy. Common bile duct is dilated at 13 mm. Mild central intrahepatic biliary ductal dilatation. No visualized choledocholithiasis. Pancreas: No ductal dilatation or  inflammation. Spleen: Normal in size without focal abnormality. Heterogeneity on initial imaging normal for phase of IV contrast. Adrenals/Urinary Tract: No adrenal nodule. No hydronephrosis or perinephric edema. Small cyst in the left mid kidney. Urinary bladder is partially distended, questionable mild bladder wall thickening. Stomach/Bowel: Bowel evaluation is limited in the absence of enteric contrast and paucity of intra-abdominal fat. Peri pyloric gastric wall thickening versus peristalsis. Fluid-filled small bowel without obstruction. Questionable mild wall hyperemia of pelvic small bowel loops. Normal appendix. Mild colonic wall thickening of sigmoid colon. Vascular/Lymphatic: Abdominal aorta is normal in caliber. The portal vein is patent. No adenopathy in the abdomen or pelvis. Reproductive: Status post hysterectomy. No adnexal masses. Other: No free air, free fluid, or intra-abdominal fluid collection. Musculoskeletal: Scoliosis with postsurgical change of the thoracolumbar spine. There are no acute or suspicious osseous abnormalities. IMPRESSION: 1. Postcholecystectomy with intra and extrahepatic biliary ductal dilatation. No visualized choledocholithiasis. Recommend correlation with LFTs. If LFTs are elevated, recommend further evaluation with MRCP or ERCP. 2. Mild small bowel hyperemia of pelvic bowel loops, with mild adjacent sigmoid colonic wall thickening. Findings suggest mild generalized enteritis/colitis. Aortic Atherosclerosis (ICD10-I70.0) and Emphysema (ICD10-J43.9). Electronically Signed   By: Keith Rake M.D.   On: 05/15/2019 01:10   Mr 3d Recon At Scanner  Result Date: 05/17/2019 CLINICAL DATA:  48 year old female with history of abnormal liver function tests. Vomiting blood. EXAM: MRI ABDOMEN WITHOUT AND WITH CONTRAST (INCLUDING MRCP) TECHNIQUE: Multiplanar multisequence MR imaging of the abdomen was performed both before and after the administration of intravenous contrast.  Heavily T2-weighted images of the biliary and pancreatic ducts were obtained, and three-dimensional MRCP images were rendered by post processing. CONTRAST:  97mL GADAVIST GADOBUTROL 1 MMOL/ML IV SOLN COMPARISON:  No prior abdominal MRI. CT the abdomen and pelvis 05/15/2019. FINDINGS: Lower chest: Susceptibility artifact in the spine and left paraspinal region. Hepatobiliary: Several subcentimeter T1 hypointense, T2 hyperintense, nonenhancing lesions are scattered throughout the liver, compatible with tiny simple cysts. No aggressive appearing hepatic lesions are noted. Status post cholecystectomy. MRCP images demonstrate moderate intra and extrahepatic biliary ductal dilatation. Common bile duct measures up to 11 mm in the porta hepatis. In the distal common bile duct (coronal MRCP image 98 of series 9) there is an amorphous filling defect in the distal common bile duct shortly  before the ampulla which is also notable on the coronal T2 weighted images, concerning for choledocholithiasis or distal ductal debris. This does not appear to enhance on postcontrast images making the likelihood of a distal ductal lesion or ampullary lesion less likely. Pancreas: No pancreatic mass. No pancreatic ductal dilatation noted on MRCP images. No pancreatic or peripancreatic fluid collections or inflammatory changes. Spleen:  Unremarkable. Adrenals/Urinary Tract: In the interpolar region of the left kidney there is a 1.3 cm T1 hypointense, T2 hyperintense, nonenhancing lesion, compatible with a simple cyst. Right kidney and bilateral adrenal glands are normal in appearance. No hydroureteronephrosis in the visualized portions of the abdomen. Stomach/Bowel: Visualized portions are unremarkable. Vascular/Lymphatic: No aneurysm identified in the visualized abdominal vasculature. No lymphadenopathy noted in the visualized abdomen. Other: No significant volume of ascites noted in the visualized portions of the peritoneal cavity.  Musculoskeletal: Extensive susceptibility artifact throughout the thoracolumbar spine and in the region of the right hemipelvis, presumably related to indwelling orthopedic fixation hardware. IMPRESSION: 1. Moderate intra and extrahepatic biliary ductal dilatation in this patient who is status post cholecystectomy. There is an amorphous filling defect in the distal common bile duct immediately before the level of the ampulla, concerning for obstructive choledocholithiasis or retained debris. 2. Additional incidental findings, as above. Electronically Signed   By: Vinnie Langton M.D.   On: 05/17/2019 11:54   Mr Abdomen Mrcp Moise Boring Contast  Result Date: 05/17/2019 CLINICAL DATA:  48 year old female with history of abnormal liver function tests. Vomiting blood. EXAM: MRI ABDOMEN WITHOUT AND WITH CONTRAST (INCLUDING MRCP) TECHNIQUE: Multiplanar multisequence MR imaging of the abdomen was performed both before and after the administration of intravenous contrast. Heavily T2-weighted images of the biliary and pancreatic ducts were obtained, and three-dimensional MRCP images were rendered by post processing. CONTRAST:  62mL GADAVIST GADOBUTROL 1 MMOL/ML IV SOLN COMPARISON:  No prior abdominal MRI. CT the abdomen and pelvis 05/15/2019. FINDINGS: Lower chest: Susceptibility artifact in the spine and left paraspinal region. Hepatobiliary: Several subcentimeter T1 hypointense, T2 hyperintense, nonenhancing lesions are scattered throughout the liver, compatible with tiny simple cysts. No aggressive appearing hepatic lesions are noted. Status post cholecystectomy. MRCP images demonstrate moderate intra and extrahepatic biliary ductal dilatation. Common bile duct measures up to 11 mm in the porta hepatis. In the distal common bile duct (coronal MRCP image 98 of series 9) there is an amorphous filling defect in the distal common bile duct shortly before the ampulla which is also notable on the coronal T2 weighted images,  concerning for choledocholithiasis or distal ductal debris. This does not appear to enhance on postcontrast images making the likelihood of a distal ductal lesion or ampullary lesion less likely. Pancreas: No pancreatic mass. No pancreatic ductal dilatation noted on MRCP images. No pancreatic or peripancreatic fluid collections or inflammatory changes. Spleen:  Unremarkable. Adrenals/Urinary Tract: In the interpolar region of the left kidney there is a 1.3 cm T1 hypointense, T2 hyperintense, nonenhancing lesion, compatible with a simple cyst. Right kidney and bilateral adrenal glands are normal in appearance. No hydroureteronephrosis in the visualized portions of the abdomen. Stomach/Bowel: Visualized portions are unremarkable. Vascular/Lymphatic: No aneurysm identified in the visualized abdominal vasculature. No lymphadenopathy noted in the visualized abdomen. Other: No significant volume of ascites noted in the visualized portions of the peritoneal cavity. Musculoskeletal: Extensive susceptibility artifact throughout the thoracolumbar spine and in the region of the right hemipelvis, presumably related to indwelling orthopedic fixation hardware. IMPRESSION: 1. Moderate intra and extrahepatic biliary ductal dilatation in this  patient who is status post cholecystectomy. There is an amorphous filling defect in the distal common bile duct immediately before the level of the ampulla, concerning for obstructive choledocholithiasis or retained debris. 2. Additional incidental findings, as above. Electronically Signed   By: Vinnie Langton M.D.   On: 05/17/2019 11:54     CBC Recent Labs  Lab 05/14/19 2354 05/15/19 0846 05/15/19 1545 05/16/19 0710 05/17/19 0841  WBC 26.0* 20.4* 16.5* 8.9 8.0  HGB 12.7 10.9* 9.6* 6.2* 11.8*  HCT 37.2 33.8* 30.0* 19.7* 33.7*  PLT 308 366 296 173 198  MCV 94.2 101.2* 100.7* 101.0* 89.9  MCH 32.2 32.6 32.2 31.8 31.5  MCHC 34.1 32.2 32.0 31.5 35.0  RDW 12.5 12.9 12.9 12.9 13.8     Chemistries  Recent Labs  Lab 05/14/19 1955 05/15/19 0357 05/15/19 0522 05/15/19 1545 05/16/19 0710  NA 139 139  --  140 139  K 4.3 3.3*  --  4.4 3.7  CL 106 120*  --  113* 113*  CO2 22 15*  --  19* 19*  GLUCOSE 134* 76  --  98 82  BUN 49* 30*  --  18 13  CREATININE 0.76 0.49  --  0.61 0.57  CALCIUM 9.0 5.9* 5.1* 7.8* 7.3*  MG  --  1.6*  --   --   --   AST 61* 29  --   --  19  ALT 133* 71*  --   --  40  ALKPHOS 549* 301*  --   --  189*  BILITOT 0.6 0.6  --   --  0.7   ------------------------------------------------------------------------------------------------------------------ No results for input(s): CHOL, HDL, LDLCALC, TRIG, CHOLHDL, LDLDIRECT in the last 72 hours.  Lab Results  Component Value Date   HGBA1C 5.2 05/15/2019   ------------------------------------------------------------------------------------------------------------------ No results for input(s): TSH, T4TOTAL, T3FREE, THYROIDAB in the last 72 hours.  Invalid input(s): FREET3 ------------------------------------------------------------------------------------------------------------------ No results for input(s): VITAMINB12, FOLATE, FERRITIN, TIBC, IRON, RETICCTPCT in the last 72 hours.  Coagulation profile Recent Labs  Lab 05/14/19 1955  INR 1.0    No results for input(s): DDIMER in the last 72 hours.  Cardiac Enzymes No results for input(s): CKMB, TROPONINI, MYOGLOBIN in the last 168 hours.  Invalid input(s): CK ------------------------------------------------------------------------------------------------------------------ No results found for: BNP   Roxan Hockey M.D on 05/17/2019 at 7:07 PM  Go to [email or web address] - for contact info  [facility] - Office  [phone]

## 2019-05-17 NOTE — Care Management Important Message (Signed)
Important Message  Patient Details  Name: Sharon Welch MRN: [id number] Date of Birth: Oct 31, 1970   Medicare Important Message Given:  Yes     Tommy Medal 05/17/2019, 2:29 PM

## 2019-05-18 ENCOUNTER — Other Ambulatory Visit: Payer: Self-pay

## 2019-05-18 DIAGNOSIS — K254 Chronic or unspecified gastric ulcer with hemorrhage: Principal | ICD-10-CM

## 2019-05-18 DIAGNOSIS — K920 Hematemesis: Secondary | ICD-10-CM

## 2019-05-18 LAB — COMPREHENSIVE METABOLIC PANEL
ALT: 28 U/L (ref 0–44)
AST: 19 U/L (ref 15–41)
Albumin: 2.9 g/dL — ABNORMAL LOW (ref 3.5–5.0)
Alkaline Phosphatase: 178 U/L — ABNORMAL HIGH (ref 38–126)
Anion gap: 10 (ref 5–15)
BUN: 5 mg/dL — ABNORMAL LOW (ref 6–20)
CO2: 25 mmol/L (ref 22–32)
Calcium: 7.8 mg/dL — ABNORMAL LOW (ref 8.9–10.3)
Chloride: 105 mmol/L (ref 98–111)
Creatinine, Ser: 0.61 mg/dL (ref 0.44–1.00)
GFR calc Af Amer: >60 mL/min (ref >60)
GFR calc non Af Amer: >60 mL/min (ref >60)
Glucose, Bld: 93 mg/dL (ref 70–99)
Potassium: 2.4 mmol/L — CL (ref 3.5–5.1)
Sodium: 140 mmol/L (ref 135–145)
Total Bilirubin: 0.8 mg/dL (ref 0.3–1.2)
Total Protein: 5.1 g/dL — ABNORMAL LOW (ref 6.5–8.1)

## 2019-05-18 LAB — CBC
HCT: 32.2 % — ABNORMAL LOW (ref 36.0–46.0)
Hemoglobin: 11.2 g/dL — ABNORMAL LOW (ref 12.0–15.0)
MCH: 31.6 pg (ref 26.0–34.0)
MCHC: 34.8 g/dL (ref 30.0–36.0)
MCV: 91 fL (ref 80.0–100.0)
Platelets: 198 10*3/uL (ref 150–400)
RBC: 3.54 MIL/uL — ABNORMAL LOW (ref 3.87–5.11)
RDW: 13.7 % (ref 11.5–15.5)
WBC: 8.3 10*3/uL (ref 4.0–10.5)
nRBC: 0 % (ref 0.0–0.2)

## 2019-05-18 LAB — SURGICAL PATHOLOGY

## 2019-05-18 LAB — MAGNESIUM: Magnesium: 1.8 mg/dL (ref 1.7–2.4)

## 2019-05-18 LAB — ANTI-SMOOTH MUSCLE ANTIBODY, IGG: F-Actin IgG: 2 Units (ref 0–19)

## 2019-05-18 LAB — IGG, IGA, IGM
IgA: 140 mg/dL (ref 87–352)
IgG (Immunoglobin G), Serum: 305 mg/dL — ABNORMAL LOW (ref 586–1602)
IgM (Immunoglobulin M), Srm: 30 mg/dL (ref 26–217)

## 2019-05-18 LAB — MITOCHONDRIAL ANTIBODIES: Mitochondrial M2 Ab, IgG: <20 Units (ref 0.0–20.0)

## 2019-05-18 MED ORDER — PANTOPRAZOLE SODIUM 40 MG PO TBEC
40.0000 mg | DELAYED_RELEASE_TABLET | Freq: Once | ORAL | Status: AC
  Administered 2019-05-18: 40 mg via ORAL
  Filled 2019-05-18: qty 1

## 2019-05-18 MED ORDER — CIPROFLOXACIN HCL 500 MG PO TABS: 500.0000 mg | ORAL_TABLET | Freq: Two times a day (BID) | ORAL | 0 refills | Status: AC

## 2019-05-18 MED ORDER — POTASSIUM CHLORIDE CRYS ER 20 MEQ PO TBCR
40.0000 meq | EXTENDED_RELEASE_TABLET | Freq: Once | ORAL | Status: AC
  Administered 2019-05-18: 40 meq via ORAL
  Filled 2019-05-18: qty 2

## 2019-05-18 MED ORDER — ONDANSETRON HCL 4 MG PO TABS: 4.0000 mg | ORAL_TABLET | Freq: Four times a day (QID) | ORAL | 0 refills | Status: DC | PRN

## 2019-05-18 MED ORDER — METRONIDAZOLE 500 MG PO TABS: 500.0000 mg | ORAL_TABLET | Freq: Three times a day (TID) | ORAL | 0 refills | Status: AC

## 2019-05-18 MED ORDER — POTASSIUM CHLORIDE CRYS ER 20 MEQ PO TBCR
40.0000 meq | EXTENDED_RELEASE_TABLET | ORAL | Status: AC
  Administered 2019-05-18 (×2): 40 meq via ORAL
  Filled 2019-05-18 (×2): qty 2

## 2019-05-18 MED ORDER — TRAZODONE HCL 150 MG PO TABS: 150.0000 mg | ORAL_TABLET | Freq: Every day | ORAL | 2 refills | Status: DC

## 2019-05-18 MED ORDER — PANTOPRAZOLE SODIUM 40 MG PO TBEC: 40.0000 mg | DELAYED_RELEASE_TABLET | Freq: Two times a day (BID) | ORAL | 3 refills | Status: DC

## 2019-05-18 MED ORDER — POTASSIUM CHLORIDE 10 MEQ/100ML IV SOLN
10.0000 meq | INTRAVENOUS | Status: AC
  Administered 2019-05-18 (×4): 10 meq via INTRAVENOUS
  Filled 2019-05-18 (×3): qty 100

## 2019-05-18 MED ORDER — NICOTINE 14 MG/24HR TD PT24: 14.0000 mg | MEDICATED_PATCH | Freq: Every day | TRANSDERMAL | 0 refills | Status: DC

## 2019-05-18 NOTE — Discharge Summary (Signed)
Sharon Welch, is a 48 y.o. female  DOB 04-16-1971  MRN [id number].  Admission date:  05/14/2019  Admitting Physician  Roxan Hockey, MD  Discharge Date:  05/18/2019   Primary MD  [facility], [facility]  Recommendations for primary care physician for things to follow:   1)Avoid ibuprofen/Advil/Aleve/Motrin/Goody Powders/Naproxen/BC powders/Meloxicam/Diclofenac/Indomethacin and other Nonsteroidal anti-inflammatory medications as these will make you more likely to bleed and can cause stomach ulcers, can also cause Kidney problems.   2)Take medications including antibiotics as prescribed  3) follow-up with gastroenterologist Dr. Benson Norway for ERCP procedure over the next week or 2 and further evaluation of your liver, you also need repeat EGD/endoscopy within the next 3 months  4)Please call or return if dark stools, or new concerns about bleeding or persistent vomiting  5) Please avoid coffee/caffeinated beverages, Tea, chocolate, carbonated drinks/soda,  spicy food, Milk,  Alcohol, acidic foods- such as citrus (Lemon, oranges), juices  and tomatoes, Meats with a high fat content. High-fat condiments and  Fried foods  Admission Diagnosis  Dehydration [E86.0] Hematemesis with nausea [K92.0] GI bleed [K92.2]   Discharge Diagnosis  Dehydration [E86.0] Hematemesis with nausea [K92.0] GI bleed [K92.2]    Principal Problem:   Acute GI bleed due to bleeding pyloric/gastric ulcer Active Problems:   PUD (peptic ulcer disease)-EGD 05/15/2019 with bleeding pyloric/gastric ulcer, nonbleeding duodenal ulcers and gastritis   Hematemesis with nausea   Migraine headache      Past Medical History:  Diagnosis Date   Bruising, spontaneous 12/08/2013   Chronic back pain    Chronic back pain    DDD (degenerative disc disease), lumbar    DDD (degenerative disc disease), lumbar    Fibromyalgia     Headache(784.0)    Ovarian cyst    Partial tear subscapularis tendon 12/27/2011   Scoliosis    Seizures ([place])    started 9/12-    Past Surgical History:  Procedure Laterality Date   ABDOMINAL HYSTERECTOMY  2012   BACK SURGERY  1989   scoliosis throsic-rods   CESAREAN SECTION     CHOLECYSTECTOMY N/A 11/17/2012   Procedure: LAPAROSCOPIC CHOLECYSTECTOMY WITH INTRAOPERATIVE CHOLANGIOGRAM;  Surgeon: Ralene Ok, MD;  Location: [facility];  Service: General;  Laterality: N/A;   ERCP N/A 05/07/2013   Procedure: ENDOSCOPIC RETROGRADE CHOLANGIOPANCREATOGRAPHY (ERCP);  Surgeon: Beryle Beams, MD;  Location: [facility];  Service: Endoscopy;  Laterality: N/A;  start ercp first, if canulation fails switch to EUS    ESOPHAGOGASTRODUODENOSCOPY (EGD) WITH PROPOFOL N/A 05/15/2019   Procedure: ESOPHAGOGASTRODUODENOSCOPY (EGD) WITH PROPOFOL;  Surgeon: Danie Binder, MD;  Location: [facility];  Service: Endoscopy;  Laterality: N/A;   EUS N/A 11/03/2012   Procedure: UPPER ENDOSCOPIC ULTRASOUND (EUS) LINEAR;  Surgeon: Beryle Beams, MD;  Location: [facility];  Service: Endoscopy;  Laterality: N/A;   left arm     NECK SURGERY     TUBAL LIGATION       HPI  from the history and physical done on the day of admission:  Sharon Welch  is a 48 y.o. female, with history of seizures, fibromyalgia, and chronic pain presents to the ED with a chief complaint of hematemesis.  Patient reports that it started the morning of November 20.  She reports that she had been throwing up 8-9 times, and does not know how many of these episodes had blood present.  She reports that she was laying in a black room in a black carpet due to headache, and did not realize there was hematemesis until she laid a blue towel down so that she "was not laying in [place]."  Patient reports when she noticed blood she thinks it was dark blood.  She has had abdominal pain.  It is in the lower abdomen and goes all the way across.  She  reports that this is her typical IBS pain.  It is 8-9 out of 10 on a pain scale.  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  Patient reports that she has had 2 melanotic stools in the ER.  Her last BM before that was at least a week ago.  She reports that all of her stools are solid.  Patient reports that her last meal was 24 hours ago.  This is abnormal for her she usually eats multiple times a day at the insistence of her husband.  Patient reports no known history of liver problems.  She had cholecystitis with a cholecystectomy and pancreatitis following that 4 to 5 years ago.  Patient reports at that time she also had an EGD.  She has never had a colonoscopy.  On review of systems patient reports that she feels dehydrated, short of breath, chest pain for 2 days, dizziness for 2 days, and tingly arm since Wednesday or Thursday.  She reports the symptoms are new for her.  She has not noticed any other signs of bleed, other than what is noted above.  Patient reports that she has had no fevers, no chills.  She has no rashes ulcers or other skin lesions.  She has not had any dysuria or hematuria, but does report that her urine is a dark color which she attributes to dehydration.  Patient has not been on steroids - and there are no other reported etiologies, aside from infection, that could be causing her WBC.    In the ED Hematology reveals a leukocytosis of 26,000.  CHEM panel reveals hypokalemia at 3.3, bicarb decreased down to 15, hypomagnesemia 1.5 and a corrected calcium of 6.7.  CT abdomen revealed 1 post cholecystectomy with intra and extrahepatic biliary ductal dilatation.  No visualized choledocholithiasis.  Recommend correlation with LFTs.  If LFTs are elevated recommend further evaluation with MRCP or ERCP. 2.  Mild small bowel hyperemia pelvic bowel loops with mild adjacent sigmoid colonic wall thickening findings suggest mild generalized enteritis/ colitis.   Hospital  Course:      48 year old female admitted with acute blood loss anemia due to UGI bleed in the setting of NSAIDs, s/p EGD on 11/21 with oozing cratered gastric ulcer with visible vessel at the pylorus s/p injection with epinephrine and 2 hemostatic clips placed.  Also with mild gastritis s/p biopsied and nonbleeding duodenal ulcers with no stigmata of bleeding.  Plans to complete Protonix drip x 72 hours (11/21-11/24) then transition to PPI twice daily with recommendations to repeat upper endoscopy in 3 months for surveillance.  Hemoglobin dropped to 6.2 on 05/16/2019 (likely equilibration), s/p 2 units PRBCs with post-transfusion hemoglobin of 11.8 on 11/23.  Hemoglobin is stable at 11.2 today. Clinically improved without abdominal pain, N/V or overt GI bleeding.     Brief Summary 48 year old admitted with hematemesis and melena on 05/15/2019 with acute blood loss anemia and EGD finding on 05/15/2019 of One oozing cratered gastric ulcer with a visible vessel was found at the pylorus, treated as well as gastritis and nonbleeding duodenal ulcers  A/p 1)Acute GI bleed---patient admitted with melena and hematemesis in the setting of Aleve use---discussed with Dr. Oneida Alar from GI service, treated with continuous IV Protonix for 72 hours postprocedure through 05/18/2019 -s/p EGD on 05/15/19 with oozing cratered gastric ulcer with visible vessel at the pylorus s/p injection with epinephrine and 2 hemostatic clips placed.  Also with mild gastritis s/p biopsied and nonbleeding duodenal ulcers with no stigmata of bleeding.  -No further bleeding, H&H stable posttransfusion -Outpatient follow-up with Dr. Benson Norway for repeat EGD in 3 months advised -Absolute avoidance of NSAIDs emphasized  2)Acute symptomatic anemia secondary to acute blood loss in the setting of GI bleed--- -Patient with orthostatic dizziness and significant fatigue, --Hemoglobin was 15.0 on 05/07/2019,hemoglobin on admission on 05/14/2019  was 13.3, repeat hemoglobin up to 11.2 from 6.2 after transfusion of 2 units of packed cells   - 3)Enteritis/colitis---CT abdomen and pelvis report noted, Clinically much improved okay to discharge home on oral Cipro and Flagyl to complete 7-day course,   4)elevated LFTs---status post prior cholecystectomy with the in 2014 without abnormal findings at that time- -MRCP on 05/17/2019 -showed moderate intra and extrahepatic biliary ductal dilatation with amorphous filling defect in distal common bile ducts concerning for obstructive choledocholithiasis or retained debris. -Patient will need to follow-up with Dr. Almyra Free for outpatient ERCP -LFTs noted -Work-up for possible autoimmune and other etiologies of hepatitis pending -Viral hepatitis profile negative including hep A, hep B and hep C -  IgG slightly low at 305, IgA and IgM within normal limits.  AMA, ANA, ASMA in process.     5)FEN-hypomagnesemia/hypokalemia/hypocalcemia--suspect due to GI losses and poor oral intake,  -Replaced6)chronic pain syndrome---PTA patient was on opiates  Lactic acidosis--- resolved with hydration   Disposition- discharge home after completing 72 hours of continuous IV Protonix infusion  Code Status : Full   Family Communication:   NA (patient is alert, awake and coherent)  Disposition Plan  : Home on 05/18/2019 after completion  of continuous IV Protonix infusion  Consults  :  Gi  Discharge Condition: stable  Follow UP-- Dr Benson Norway   Diet and Activity recommendation:  As advised  Discharge Instructions    Discharge Instructions    Call MD for:  difficulty breathing, headache or visual disturbances   Complete by: As directed    Call MD for:  persistant dizziness or light-headedness   Complete by: As directed    Call MD for:  persistant nausea and vomiting   Complete by: As directed    Call MD for:  severe uncontrolled pain   Complete by: As directed    Call MD for:  temperature  >100.4   Complete by: As directed    Diet - low sodium heart healthy   Complete by: As directed    Please avoid coffee/caffeinated beverages, Tea, chocolate, carbonated drinks/soda,  spicy food, Milk,  Alcohol, acidic foods- such as citrus (Lemon, oranges), juices  and tomatoes, Meats with a high fat content. High-fat condiments and  Fried foods   Discharge instructions   Complete by: As directed    1)Avoid ibuprofen/Advil/Aleve/Motrin/Goody Powders/Naproxen/BC powders/Meloxicam/Diclofenac/Indomethacin and other Nonsteroidal  anti-inflammatory medications as these will make you more likely to bleed and can cause stomach ulcers, can also cause Kidney problems.   2) take medications including antibiotics as prescribed  3) follow-up with gastroenterologist Dr. Benson Norway for ERCP procedure over the next week or 2 and further evaluation of your liver, you also need repeat EGD/endoscopy within the next 3 months  4)Please call or return if dark stools, or new concerns about bleeding or persistent vomiting  5) Please avoid coffee/caffeinated beverages, Tea, chocolate, carbonated drinks/soda,  spicy food, Milk,  Alcohol, acidic foods- such as citrus (Lemon, oranges), juices  and tomatoes, Meats with a high fat content. High-fat condiments and  Fried foods   Increase activity slowly   Complete by: As directed        Discharge Medications     Allergies as of 05/18/2019      Reactions   Tramadol Other (See Comments)   Possible seizures      Medication List    STOP taking these medications   ibuprofen 600 MG tablet Commonly known as: ADVIL   methocarbamol 500 MG tablet Commonly known as: ROBAXIN   oxyCODONE-acetaminophen 5-325 MG tablet Commonly known as: Percocet     TAKE these medications   ciprofloxacin 500 MG tablet Commonly known as: Cipro Take 1 tablet (500 mg total) by mouth 2 (two) times daily for 5 days.   HYDROcodone-acetaminophen 10-325 MG tablet Commonly known as:  NORCO Take 1 tablet by mouth every 4 (four) hours as needed for moderate pain or severe pain.   levETIRAcetam 500 MG tablet Commonly known as: Keppra Take 1 tablet (500 mg total) by mouth 2 (two) times daily.   metroNIDAZOLE 500 MG tablet Commonly known as: Flagyl Take 1 tablet (500 mg total) by mouth 3 (three) times daily for 5 days.   nicotine 14 mg/24hr patch Commonly known as: NICODERM CQ - dosed in mg/24 hours Place 1 patch (14 mg total) onto the skin daily. Start taking on: May 19, 2019   ondansetron 4 MG tablet Commonly known as: ZOFRAN Take 1 tablet (4 mg total) by mouth every 6 (six) hours as needed for nausea or vomiting.   pantoprazole 40 MG tablet Commonly known as: PROTONIX Take 1 tablet (40 mg total) by mouth 2 (two) times daily before a meal.   Savella 50 MG Tabs tablet Generic drug: Milnacipran Take 50 mg by mouth 2 (two) times daily.   tiZANidine 2 MG tablet Commonly known as: ZANAFLEX Take 2 mg by mouth every 6 (six) hours as needed for muscle spasms.   traZODone 150 MG tablet Commonly known as: DESYREL Take 1 tablet (150 mg total) by mouth at bedtime.      Major procedures and Radiology Reports - PLEASE review detailed and final reports for all details, in brief -   Ct Head Wo Contrast  Result Date: 05/07/2019 CLINICAL DATA:  Seizure.  Encephalopathy. EXAM: CT HEAD WITHOUT CONTRAST TECHNIQUE: Contiguous axial images were obtained from the base of the skull through the vertex without intravenous contrast. COMPARISON:  March 07, 2017 FINDINGS: Brain: No evidence of acute infarction, hemorrhage, hydrocephalus, extra-axial collection or mass lesion/mass effect. Vascular: No hyperdense vessel noted. Skull: Normal. Negative for fracture or focal lesion. Sinuses/Orbits: No acute finding. Other: None. IMPRESSION: No focal acute intracranial abnormality identified. Electronically Signed   By: Abelardo Diesel M.D.   On: 05/07/2019 14:30   Ct Abdomen Pelvis  W Contrast  Result Date: 05/15/2019 CLINICAL DATA:  Acute generalized abdominal  pain. Patient reports vomiting blood. EXAM: CT ABDOMEN AND PELVIS WITH CONTRAST TECHNIQUE: Multidetector CT imaging of the abdomen and pelvis was performed using the standard protocol following bolus administration of intravenous contrast. CONTRAST:  133mL OMNIPAQUE IOHEXOL 300 MG/ML  SOLN COMPARISON:  CT 05/08/2013 FINDINGS: Lower chest: Emphysema. No acute airspace disease or pleural fluid. Hepatobiliary: 7 mm cyst in the right lobe of the liver. Additional tiny hepatic hypodensities that are too small to characterize. Postcholecystectomy. Common bile duct is dilated at 13 mm. Mild central intrahepatic biliary ductal dilatation. No visualized choledocholithiasis. Pancreas: No ductal dilatation or inflammation. Spleen: Normal in size without focal abnormality. Heterogeneity on initial imaging normal for phase of IV contrast. Adrenals/Urinary Tract: No adrenal nodule. No hydronephrosis or perinephric edema. Small cyst in the left mid kidney. Urinary bladder is partially distended, questionable mild bladder wall thickening. Stomach/Bowel: Bowel evaluation is limited in the absence of enteric contrast and paucity of intra-abdominal fat. Peri pyloric gastric wall thickening versus peristalsis. Fluid-filled small bowel without obstruction. Questionable mild wall hyperemia of pelvic small bowel loops. Normal appendix. Mild colonic wall thickening of sigmoid colon. Vascular/Lymphatic: Abdominal aorta is normal in caliber. The portal vein is patent. No adenopathy in the abdomen or pelvis. Reproductive: Status post hysterectomy. No adnexal masses. Other: No free air, free fluid, or intra-abdominal fluid collection. Musculoskeletal: Scoliosis with postsurgical change of the thoracolumbar spine. There are no acute or suspicious osseous abnormalities. IMPRESSION: 1. Postcholecystectomy with intra and extrahepatic biliary ductal dilatation. No  visualized choledocholithiasis. Recommend correlation with LFTs. If LFTs are elevated, recommend further evaluation with MRCP or ERCP. 2. Mild small bowel hyperemia of pelvic bowel loops, with mild adjacent sigmoid colonic wall thickening. Findings suggest mild generalized enteritis/colitis. Aortic Atherosclerosis (ICD10-I70.0) and Emphysema (ICD10-J43.9). Electronically Signed   By: Keith Rake M.D.   On: 05/15/2019 01:10   Mr 3d Recon At Scanner  Result Date: 05/17/2019 CLINICAL DATA:  48 year old female with history of abnormal liver function tests. Vomiting blood. EXAM: MRI ABDOMEN WITHOUT AND WITH CONTRAST (INCLUDING MRCP) TECHNIQUE: Multiplanar multisequence MR imaging of the abdomen was performed both before and after the administration of intravenous contrast. Heavily T2-weighted images of the biliary and pancreatic ducts were obtained, and three-dimensional MRCP images were rendered by post processing. CONTRAST:  29mL GADAVIST GADOBUTROL 1 MMOL/ML IV SOLN COMPARISON:  No prior abdominal MRI. CT the abdomen and pelvis 05/15/2019. FINDINGS: Lower chest: Susceptibility artifact in the spine and left paraspinal region. Hepatobiliary: Several subcentimeter T1 hypointense, T2 hyperintense, nonenhancing lesions are scattered throughout the liver, compatible with tiny simple cysts. No aggressive appearing hepatic lesions are noted. Status post cholecystectomy. MRCP images demonstrate moderate intra and extrahepatic biliary ductal dilatation. Common bile duct measures up to 11 mm in the porta hepatis. In the distal common bile duct (coronal MRCP image 98 of series 9) there is an amorphous filling defect in the distal common bile duct shortly before the ampulla which is also notable on the coronal T2 weighted images, concerning for choledocholithiasis or distal ductal debris. This does not appear to enhance on postcontrast images making the likelihood of a distal ductal lesion or ampullary lesion less  likely. Pancreas: No pancreatic mass. No pancreatic ductal dilatation noted on MRCP images. No pancreatic or peripancreatic fluid collections or inflammatory changes. Spleen:  Unremarkable. Adrenals/Urinary Tract: In the interpolar region of the left kidney there is a 1.3 cm T1 hypointense, T2 hyperintense, nonenhancing lesion, compatible with a simple cyst. Right kidney and bilateral adrenal glands are normal in  appearance. No hydroureteronephrosis in the visualized portions of the abdomen. Stomach/Bowel: Visualized portions are unremarkable. Vascular/Lymphatic: No aneurysm identified in the visualized abdominal vasculature. No lymphadenopathy noted in the visualized abdomen. Other: No significant volume of ascites noted in the visualized portions of the peritoneal cavity. Musculoskeletal: Extensive susceptibility artifact throughout the thoracolumbar spine and in the region of the right hemipelvis, presumably related to indwelling orthopedic fixation hardware. IMPRESSION: 1. Moderate intra and extrahepatic biliary ductal dilatation in this patient who is status post cholecystectomy. There is an amorphous filling defect in the distal common bile duct immediately before the level of the ampulla, concerning for obstructive choledocholithiasis or retained debris. 2. Additional incidental findings, as above. Electronically Signed   By: Vinnie Langton M.D.   On: 05/17/2019 11:54   Mr Abdomen Mrcp Moise Boring Contast  Result Date: 05/17/2019 CLINICAL DATA:  48 year old female with history of abnormal liver function tests. Vomiting blood. EXAM: MRI ABDOMEN WITHOUT AND WITH CONTRAST (INCLUDING MRCP) TECHNIQUE: Multiplanar multisequence MR imaging of the abdomen was performed both before and after the administration of intravenous contrast. Heavily T2-weighted images of the biliary and pancreatic ducts were obtained, and three-dimensional MRCP images were rendered by post processing. CONTRAST:  59mL GADAVIST GADOBUTROL 1  MMOL/ML IV SOLN COMPARISON:  No prior abdominal MRI. CT the abdomen and pelvis 05/15/2019. FINDINGS: Lower chest: Susceptibility artifact in the spine and left paraspinal region. Hepatobiliary: Several subcentimeter T1 hypointense, T2 hyperintense, nonenhancing lesions are scattered throughout the liver, compatible with tiny simple cysts. No aggressive appearing hepatic lesions are noted. Status post cholecystectomy. MRCP images demonstrate moderate intra and extrahepatic biliary ductal dilatation. Common bile duct measures up to 11 mm in the porta hepatis. In the distal common bile duct (coronal MRCP image 98 of series 9) there is an amorphous filling defect in the distal common bile duct shortly before the ampulla which is also notable on the coronal T2 weighted images, concerning for choledocholithiasis or distal ductal debris. This does not appear to enhance on postcontrast images making the likelihood of a distal ductal lesion or ampullary lesion less likely. Pancreas: No pancreatic mass. No pancreatic ductal dilatation noted on MRCP images. No pancreatic or peripancreatic fluid collections or inflammatory changes. Spleen:  Unremarkable. Adrenals/Urinary Tract: In the interpolar region of the left kidney there is a 1.3 cm T1 hypointense, T2 hyperintense, nonenhancing lesion, compatible with a simple cyst. Right kidney and bilateral adrenal glands are normal in appearance. No hydroureteronephrosis in the visualized portions of the abdomen. Stomach/Bowel: Visualized portions are unremarkable. Vascular/Lymphatic: No aneurysm identified in the visualized abdominal vasculature. No lymphadenopathy noted in the visualized abdomen. Other: No significant volume of ascites noted in the visualized portions of the peritoneal cavity. Musculoskeletal: Extensive susceptibility artifact throughout the thoracolumbar spine and in the region of the right hemipelvis, presumably related to indwelling orthopedic fixation hardware.  IMPRESSION: 1. Moderate intra and extrahepatic biliary ductal dilatation in this patient who is status post cholecystectomy. There is an amorphous filling defect in the distal common bile duct immediately before the level of the ampulla, concerning for obstructive choledocholithiasis or retained debris. 2. Additional incidental findings, as above. Electronically Signed   By: Vinnie Langton M.D.   On: 05/17/2019 11:54    Micro Results    Recent Results (from the past 240 hour(s))  SARS Coronavirus 2 by RT PCR (hospital order, performed in [facility] hospital lab) Nasopharyngeal Nasopharyngeal Swab     Status: None   Collection Time: 05/15/19 12:02 AM   Specimen:  Nasopharyngeal Swab  Result Value Ref Range Status   SARS Coronavirus 2 NEGATIVE NEGATIVE Final    Comment: (NOTE) If result is NEGATIVE SARS-CoV-2 target nucleic acids are NOT DETECTED. The SARS-CoV-2 RNA is generally detectable in upper and lower  respiratory specimens during the acute phase of infection. The lowest  concentration of SARS-CoV-2 viral copies this assay can detect is 250  copies / mL. A negative result does not preclude SARS-CoV-2 infection  and should not be used as the sole basis for treatment or other  patient management decisions.  A negative result may occur with  improper specimen collection / handling, submission of specimen other  than nasopharyngeal swab, presence of viral mutation(s) within the  areas targeted by this assay, and inadequate number of viral copies  (<250 copies / mL). A negative result must be combined with clinical  observations, patient history, and epidemiological information. If result is POSITIVE SARS-CoV-2 target nucleic acids are DETECTED. The SARS-CoV-2 RNA is generally detectable in upper and lower  respiratory specimens dur ing the acute phase of infection.  Positive  results are indicative of active infection with SARS-CoV-2.  Clinical  correlation with patient history and  other diagnostic information is  necessary to determine patient infection status.  Positive results do  not rule out bacterial infection or co-infection with other viruses. If result is PRESUMPTIVE POSTIVE SARS-CoV-2 nucleic acids MAY BE PRESENT.   A presumptive positive result was obtained on the submitted specimen  and confirmed on repeat testing.  While 2019 novel coronavirus  (SARS-CoV-2) nucleic acids may be present in the submitted sample  additional confirmatory testing may be necessary for epidemiological  and / or clinical management purposes  to differentiate between  SARS-CoV-2 and other Sarbecovirus currently known to infect humans.  If clinically indicated additional testing with an alternate test  methodology [phone]) is advised. The SARS-CoV-2 RNA is generally  detectable in upper and lower respiratory sp ecimens during the acute  phase of infection. The expected result is Negative. Fact Sheet for Patients:  StrictlyIdeas.no Fact Sheet for Healthcare Providers: BankingDealers.co.za This test is not yet approved or cleared by the [place] FDA and has been authorized for detection and/or diagnosis of SARS-CoV-2 by FDA under an Emergency Use Authorization (EUA).  This EUA will remain in effect (meaning this test can be used) for the duration of the COVID-19 declaration under Section 564(b)(1) of the Act, 21 U.S.C. section 360bbb-3(b)(1), unless the authorization is terminated or revoked sooner. Performed at [facility], [address], [place], [place] [address]   Urine culture     Status: Abnormal   Collection Time: 05/15/19  3:05 AM   Specimen: Urine, Random  Result Value Ref Range Status   Specimen Description   Final    URINE, RANDOM Performed at [facility], [address] [place], [place] [address]    Special Requests   Final    NONE Performed at [facility], [address], [place],  [address]     Culture (A)  Final    40,000 COLONIES/mL ESCHERICHIA COLI 40,000 COLONIES/mL KLEBSIELLA PNEUMONIAE    Report Status 05/17/2019 FINAL  Final   Organism ID, Bacteria ESCHERICHIA COLI (A)  Final   Organism ID, Bacteria KLEBSIELLA PNEUMONIAE (A)  Final      Susceptibility   Escherichia coli - MIC*    AMPICILLIN 8 SENSITIVE Sensitive     CEFAZOLIN <=4 SENSITIVE Sensitive     CEFTRIAXONE <=1 SENSITIVE Sensitive  CIPROFLOXACIN <=0.25 SENSITIVE Sensitive     GENTAMICIN <=1 SENSITIVE Sensitive     IMIPENEM <=0.25 SENSITIVE Sensitive     NITROFURANTOIN <=16 SENSITIVE Sensitive     TRIMETH/SULFA <=20 SENSITIVE Sensitive     AMPICILLIN/SULBACTAM <=2 SENSITIVE Sensitive     PIP/TAZO <=4 SENSITIVE Sensitive     Extended ESBL NEGATIVE Sensitive     * 40,000 COLONIES/mL ESCHERICHIA COLI   Klebsiella pneumoniae - MIC*    AMPICILLIN RESISTANT Resistant     CEFAZOLIN <=4 SENSITIVE Sensitive     CEFTRIAXONE <=1 SENSITIVE Sensitive     CIPROFLOXACIN <=0.25 SENSITIVE Sensitive     GENTAMICIN <=1 SENSITIVE Sensitive     IMIPENEM <=0.25 SENSITIVE Sensitive     NITROFURANTOIN 64 INTERMEDIATE Intermediate     TRIMETH/SULFA <=20 SENSITIVE Sensitive     AMPICILLIN/SULBACTAM 4 SENSITIVE Sensitive     PIP/TAZO <=4 SENSITIVE Sensitive     Extended ESBL NEGATIVE Sensitive     * 40,000 COLONIES/mL KLEBSIELLA PNEUMONIAE       Today   Subjective    Sharon Welch today has no new complaints Had BM without blood -Tolerating oral intake No dizziness palpitations or dyspnea          Patient has been seen and examined prior to discharge   Objective   Blood pressure 126/73, pulse 83, temperature 98 F (36.7 C), temperature source Oral, resp. rate 16, height 5' (1.524 m), weight 42.2 kg, SpO2 96 %.   Intake/Output Summary (Last 24 hours) at 05/18/2019 1402 Last data filed at 05/17/2019 2100 Gross per 24 hour  Intake 240 ml  Output 400 ml  Net -160 ml    Exam Gen:- Awake Alert, no acute  distress  HEENT:- [place].AT, No sclera icterus Neck-Supple Neck,No JVD,.  Lungs-  CTAB , good air movement bilaterally  CV- S1, S2 normal, regular Abd-  +ve B.Sounds, Abd Soft, No tenderness,    Extremity/Skin:- No  edema,   good pulses Psych-affect is appropriate, oriented x3 Neuro-no new focal deficits, no tremors    Data Review   CBC w Diff:  Lab Results  Component Value Date   WBC 8.3 05/18/2019   HGB 11.2 (L) 05/18/2019   HCT 32.2 (L) 05/18/2019   PLT 198 05/18/2019   LYMPHOPCT 17 05/07/2019   MONOPCT 9 05/07/2019   EOSPCT 2 05/07/2019   BASOPCT 1 05/07/2019    CMP:  Lab Results  Component Value Date   NA 140 05/18/2019   K 2.4 (LL) 05/18/2019   CL 105 05/18/2019   CO2 25 05/18/2019   BUN 5 (L) 05/18/2019   CREATININE 0.61 05/18/2019   PROT 5.1 (L) 05/18/2019   ALBUMIN 2.9 (L) 05/18/2019   BILITOT 0.8 05/18/2019   ALKPHOS 178 (H) 05/18/2019   AST 19 05/18/2019   ALT 28 05/18/2019  .   Total Discharge time is about 33 minutes  Roxan Hockey M.D on 05/18/2019 at 2:02 PM  Go to [email or web address] -  for contact info  [facility] - Office  [phone]

## 2019-05-18 NOTE — Progress Notes (Signed)
Subjective: She is tearful when I entered the room. States she is ready to go home and misses her family. States she feels fine, she just wants to go home. Minimal nausea associated with her back pain yesterday after MRCP. No vomiting. No abdominal pain. Had pancakes and bacon for breakfast and tolerated it well, no nausea with this. Had BM last night. No blood in the stool. Color was "baby poop green." Stool was mushy. No melena.   Objective: Vital signs in last 24 hours: Temp:  [98 F (36.7 C)-98.4 F (36.9 C)] 98 F (36.7 C) (11/24 0356) Pulse Rate:  [78-83] 83 (11/24 0356) Resp:  [16-20] 16 (11/24 0356) BP: (126-145)/(73-82) 126/73 (11/24 0356) SpO2:  [96 %-99 %] 96 % (11/24 0356) Last BM Date: 05/17/19 General:   Alert and oriented, pleasant. Tearful at first due to wanting to go home. Head:  Normocephalic and atraumatic. Eyes:  No icterus, sclera clear. Conjuctiva pink.  Abdomen:  Bowel sounds present, soft, non-tender, non-distended. No HSM or hernias noted. No rebound or guarding. No masses appreciated  Msk:  Symmetrical without gross deformities. Extremities:  Without edema. Neurologic:  Alert and  oriented x4;  grossly normal neurologically. Skin:  Warm and dry, intact without significant lesions.  Psych:  Normal mood and affect.  Intake/Output from previous day: 11/23 0701 - 11/24 0700 In: 480 [P.O.:480] Out: 600 [Urine:600] Intake/Output this shift: No intake/output data recorded.  Lab Results: Recent Labs    05/16/19 0710 05/17/19 0841 05/18/19 0908  WBC 8.9 8.0 8.3  HGB 6.2* 11.8* 11.2*  HCT 19.7* 33.7* 32.2*  PLT 173 198 198   BMET Recent Labs    05/15/19 1545 05/16/19 0710 05/18/19 0908  NA 140 139 140  K 4.4 3.7 2.4*  CL 113* 113* 105  CO2 19* 19* 25  GLUCOSE 98 82 93  BUN 18 13 5*  CREATININE 0.61 0.57 0.61  CALCIUM 7.8* 7.3* 7.8*   LFT Recent Labs    05/15/19 1545 05/16/19 0710 05/18/19 0908  PROT  --  4.1* 5.1*  ALBUMIN 3.2*  2.4* 2.9*  AST  --  19 19  ALT  --  40 28  ALKPHOS  --  189* 178*  BILITOT  --  0.7 0.8    Studies/Results: Mr 3d Recon At Scanner  Result Date: 05/17/2019 CLINICAL DATA:  48 year old female with history of abnormal liver function tests. Vomiting blood. EXAM: MRI ABDOMEN WITHOUT AND WITH CONTRAST (INCLUDING MRCP) TECHNIQUE: Multiplanar multisequence MR imaging of the abdomen was performed both before and after the administration of intravenous contrast. Heavily T2-weighted images of the biliary and pancreatic ducts were obtained, and three-dimensional MRCP images were rendered by post processing. CONTRAST:  18m GADAVIST GADOBUTROL 1 MMOL/ML IV SOLN COMPARISON:  No prior abdominal MRI. CT the abdomen and pelvis 05/15/2019. FINDINGS: Lower chest: Susceptibility artifact in the spine and left paraspinal region. Hepatobiliary: Several subcentimeter T1 hypointense, T2 hyperintense, nonenhancing lesions are scattered throughout the liver, compatible with tiny simple cysts. No aggressive appearing hepatic lesions are noted. Status post cholecystectomy. MRCP images demonstrate moderate intra and extrahepatic biliary ductal dilatation. Common bile duct measures up to 11 mm in the porta hepatis. In the distal common bile duct (coronal MRCP image 98 of series 9) there is an amorphous filling defect in the distal common bile duct shortly before the ampulla which is also notable on the coronal T2 weighted images, concerning for choledocholithiasis or distal ductal debris. This does not appear to enhance  on postcontrast images making the likelihood of a distal ductal lesion or ampullary lesion less likely. Pancreas: No pancreatic mass. No pancreatic ductal dilatation noted on MRCP images. No pancreatic or peripancreatic fluid collections or inflammatory changes. Spleen:  Unremarkable. Adrenals/Urinary Tract: In the interpolar region of the left kidney there is a 1.3 cm T1 hypointense, T2 hyperintense, nonenhancing  lesion, compatible with a simple cyst. Right kidney and bilateral adrenal glands are normal in appearance. No hydroureteronephrosis in the visualized portions of the abdomen. Stomach/Bowel: Visualized portions are unremarkable. Vascular/Lymphatic: No aneurysm identified in the visualized abdominal vasculature. No lymphadenopathy noted in the visualized abdomen. Other: No significant volume of ascites noted in the visualized portions of the peritoneal cavity. Musculoskeletal: Extensive susceptibility artifact throughout the thoracolumbar spine and in the region of the right hemipelvis, presumably related to indwelling orthopedic fixation hardware. IMPRESSION: 1. Moderate intra and extrahepatic biliary ductal dilatation in this patient who is status post cholecystectomy. There is an amorphous filling defect in the distal common bile duct immediately before the level of the ampulla, concerning for obstructive choledocholithiasis or retained debris. 2. Additional incidental findings, as above. Electronically Signed   By: Vinnie Langton M.D.   On: 05/17/2019 11:54   Mr Abdomen Mrcp Moise Boring Contast  Result Date: 05/17/2019 CLINICAL DATA:  48 year old female with history of abnormal liver function tests. Vomiting blood. EXAM: MRI ABDOMEN WITHOUT AND WITH CONTRAST (INCLUDING MRCP) TECHNIQUE: Multiplanar multisequence MR imaging of the abdomen was performed both before and after the administration of intravenous contrast. Heavily T2-weighted images of the biliary and pancreatic ducts were obtained, and three-dimensional MRCP images were rendered by post processing. CONTRAST:  33m GADAVIST GADOBUTROL 1 MMOL/ML IV SOLN COMPARISON:  No prior abdominal MRI. CT the abdomen and pelvis 05/15/2019. FINDINGS: Lower chest: Susceptibility artifact in the spine and left paraspinal region. Hepatobiliary: Several subcentimeter T1 hypointense, T2 hyperintense, nonenhancing lesions are scattered throughout the liver, compatible with  tiny simple cysts. No aggressive appearing hepatic lesions are noted. Status post cholecystectomy. MRCP images demonstrate moderate intra and extrahepatic biliary ductal dilatation. Common bile duct measures up to 11 mm in the porta hepatis. In the distal common bile duct (coronal MRCP image 98 of series 9) there is an amorphous filling defect in the distal common bile duct shortly before the ampulla which is also notable on the coronal T2 weighted images, concerning for choledocholithiasis or distal ductal debris. This does not appear to enhance on postcontrast images making the likelihood of a distal ductal lesion or ampullary lesion less likely. Pancreas: No pancreatic mass. No pancreatic ductal dilatation noted on MRCP images. No pancreatic or peripancreatic fluid collections or inflammatory changes. Spleen:  Unremarkable. Adrenals/Urinary Tract: In the interpolar region of the left kidney there is a 1.3 cm T1 hypointense, T2 hyperintense, nonenhancing lesion, compatible with a simple cyst. Right kidney and bilateral adrenal glands are normal in appearance. No hydroureteronephrosis in the visualized portions of the abdomen. Stomach/Bowel: Visualized portions are unremarkable. Vascular/Lymphatic: No aneurysm identified in the visualized abdominal vasculature. No lymphadenopathy noted in the visualized abdomen. Other: No significant volume of ascites noted in the visualized portions of the peritoneal cavity. Musculoskeletal: Extensive susceptibility artifact throughout the thoracolumbar spine and in the region of the right hemipelvis, presumably related to indwelling orthopedic fixation hardware. IMPRESSION: 1. Moderate intra and extrahepatic biliary ductal dilatation in this patient who is status post cholecystectomy. There is an amorphous filling defect in the distal common bile duct immediately before the level of the ampulla, concerning  for obstructive choledocholithiasis or retained debris. 2. Additional  incidental findings, as above. Electronically Signed   By: Vinnie Langton M.D.   On: 05/17/2019 11:54    Assessment: 48 year old female admitted with acute blood loss anemia due to UGI bleed in the setting of NSAIDs, s/p EGD on 11/21 with oozing cratered gastric ulcer with visible vessel at the pylorus s/p injection with epinephrine and 2 hemostatic clips placed.  Also with mild gastritis s/p biopsied and nonbleeding duodenal ulcers with no stigmata of bleeding.  Plans to complete Protonix drip x 72 hours (11/21-11/24) then transition to PPI twice daily with recommendations to repeat upper endoscopy in 3 months for surveillance.  Hemoglobin dropped to 6.2 on 05/16/2019 (likely equilibration), s/p 2 units PRBCs with post-transfusion hemoglobin of 11.8 on 11/23. Hemoglobin is stable at 11.2 today. Clinically improved without abdominal pain, N/V or overt GI bleeding.   Elevated LFTs: With mixed pattern and CT noting postcholecystectomy state and intra/extrahepatic biliary ductal dilatation, no choledocholithiasis. AST and ALT have returned to normal Alk phos improved from 549 on 11/20 to 178 today.  Acute hepatitis panel negative.  IgG slightly low at 305, IgA and IgM within normal limits.  AMA, ANA, ASMA in process.  MRCP on 05/17/2019 revealing moderate intra and extrahepatic ductal dilation with amorphous filling defect in the distal common bile duct immediately before the level of the ampulla, concerning for obstructive choledocholithiasis or retained debris's.  Patient is currently asymptomatic. Plans to follow-up outpatient with her primary GI physician, Dr. Benson Norway. Our staff contacted their office and they can see patient as early as Monday but patient has to call to make the appointment due to prior billing concerns.  Patient was advised to continue to monitor for recurrence of abdominal pain, fever, chills, nausea, or vomiting once she is discharged as she would need to be seen emergently for  ERCP.  Mild enteritis/colitis: Noted on CT. Clinically improved/asymptomatic at this point. Empirically on Cipro and Flagyl for 5-7 days. Therapy initiated on 05/15/19.   Hypokalemia: Potassium 2.4 today.  Repletion today per hospitalists.   Plan: Protonix drip will be completed today and she will transition to Protonix 40 mg twice daily.  Repeat EGD in 3 months with Dr. Benson Norway. Avoid all NSAIDs.  Continue to follow LFTs and monitor for development of abdominal pain, fever, chills, nausea, or vomiting. Patient needs follow-up appointment with Dr. Benson Norway for CBD stone. Continue empiric Cipro and Flagyl for 5-7 days.    LOS: 3 days    05/18/2019, 11:47 AM   Aliene Altes, PA-C [facility]

## 2019-05-18 NOTE — Discharge Instructions (Signed)
1)Avoid ibuprofen/Advil/Aleve/Motrin/Goody Powders/Naproxen/BC powders/Meloxicam/Diclofenac/Indomethacin and other Nonsteroidal anti-inflammatory medications as these will make you more likely to bleed and can cause stomach ulcers, can also cause Kidney problems.   2) take medications including antibiotics as prescribed  3) follow-up with gastroenterologist Dr. Benson Norway for ERCP procedure over the next week or 2 and further evaluation of your liver, you also need repeat EGD/endoscopy within the next 3 months  4)Please call or return if dark stools, or new concerns about bleeding or persistent vomiting  5) Please avoid coffee/caffeinated beverages, Tea, chocolate, carbonated drinks/soda,  spicy food, Milk,  Alcohol, acidic foods- such as citrus (Lemon, oranges), juices  and tomatoes, Meats with a high fat content. High-fat condiments and  Fried foods

## 2019-05-18 NOTE — Progress Notes (Signed)
CRITICAL VALUE ALERT  Critical Value:  Potassium 2.4  Date & Time Notied:  05/18/2019  Provider Notified: Dr. Denton Brick  Orders Received/Actions taken:

## 2019-05-18 NOTE — Progress Notes (Signed)
Discharge instructions given to patient. Patient verbalized understanding of instructions.  Discharged home with husband in stable condition.

## 2019-05-20 LAB — ANTINUCLEAR ANTIBODIES, IFA: ANA Ab, IFA: NEGATIVE

## 2019-05-24 DIAGNOSIS — Z681 Body mass index (BMI) 19 or less, adult: Secondary | ICD-10-CM | POA: Diagnosis not present

## 2019-05-24 DIAGNOSIS — K9289 Other specified diseases of the digestive system: Secondary | ICD-10-CM | POA: Diagnosis not present

## 2019-05-31 ENCOUNTER — Ambulatory Visit
Admission: RE | Admit: 2019-05-31 | Discharge: 2019-05-31 | Disposition: A | Discharge status: Home / Self Care | Payer: Medicare Other | Source: Ambulatory Visit | Attending: Gastroenterology | Admitting: Gastroenterology

## 2019-05-31 ENCOUNTER — Other Ambulatory Visit: Payer: Self-pay | Admitting: Gastroenterology

## 2019-05-31 DIAGNOSIS — K254 Chronic or unspecified gastric ulcer with hemorrhage: Secondary | ICD-10-CM | POA: Diagnosis not present

## 2019-05-31 DIAGNOSIS — S299XXA Unspecified injury of thorax, initial encounter: Secondary | ICD-10-CM | POA: Diagnosis not present

## 2019-05-31 DIAGNOSIS — R935 Abnormal findings on diagnostic imaging of other abdominal regions, including retroperitoneum: Secondary | ICD-10-CM | POA: Diagnosis not present

## 2019-05-31 DIAGNOSIS — R7401 Elevation of levels of liver transaminase levels: Secondary | ICD-10-CM | POA: Diagnosis not present

## 2019-05-31 DIAGNOSIS — S2232XB Fracture of one rib, left side, initial encounter for open fracture: Secondary | ICD-10-CM

## 2019-05-31 DIAGNOSIS — D509 Iron deficiency anemia, unspecified: Secondary | ICD-10-CM | POA: Diagnosis not present

## 2019-05-31 DIAGNOSIS — R079 Chest pain, unspecified: Secondary | ICD-10-CM | POA: Diagnosis not present

## 2019-05-31 DIAGNOSIS — R0781 Pleurodynia: Secondary | ICD-10-CM | POA: Diagnosis not present

## 2019-06-01 DIAGNOSIS — M5030 Other cervical disc degeneration, unspecified cervical region: Secondary | ICD-10-CM | POA: Diagnosis not present

## 2019-06-01 DIAGNOSIS — M5481 Occipital neuralgia: Secondary | ICD-10-CM | POA: Diagnosis not present

## 2019-06-01 DIAGNOSIS — M4606 Spinal enthesopathy, lumbar region: Secondary | ICD-10-CM | POA: Diagnosis not present

## 2019-06-01 DIAGNOSIS — G894 Chronic pain syndrome: Secondary | ICD-10-CM | POA: Diagnosis not present

## 2019-06-02 ENCOUNTER — Other Ambulatory Visit: Payer: Self-pay

## 2019-06-02 DIAGNOSIS — Z20828 Contact with and (suspected) exposure to other viral communicable diseases: Secondary | ICD-10-CM | POA: Diagnosis not present

## 2019-06-02 DIAGNOSIS — Z20822 Contact with and (suspected) exposure to covid-19: Secondary | ICD-10-CM

## 2019-06-03 LAB — NOVEL CORONAVIRUS, NAA: SARS-CoV-2, NAA: NOT DETECTED

## 2019-06-08 ENCOUNTER — Other Ambulatory Visit (HOSPITAL_COMMUNITY)
Admission: RE | Admit: 2019-06-08 | Discharge: 2019-06-08 | Disposition: A | Discharge status: Home / Self Care | Payer: Medicare Other | Source: Ambulatory Visit | Attending: Gastroenterology | Admitting: Gastroenterology

## 2019-06-08 DIAGNOSIS — Z20828 Contact with and (suspected) exposure to other viral communicable diseases: Secondary | ICD-10-CM | POA: Diagnosis not present

## 2019-06-08 DIAGNOSIS — Z01812 Encounter for preprocedural laboratory examination: Secondary | ICD-10-CM | POA: Diagnosis present

## 2019-06-09 ENCOUNTER — Encounter (HOSPITAL_COMMUNITY): Payer: Self-pay | Admitting: Gastroenterology

## 2019-06-09 LAB — NOVEL CORONAVIRUS, NAA (HOSP ORDER, SEND-OUT TO REF LAB; TAT 18-24 HRS): SARS-CoV-2, NAA: NOT DETECTED

## 2019-06-11 ENCOUNTER — Ambulatory Visit (HOSPITAL_COMMUNITY): Payer: Medicare Other | Admitting: Anesthesiology

## 2019-06-11 ENCOUNTER — Ambulatory Visit (HOSPITAL_COMMUNITY): Payer: Medicare Other

## 2019-06-11 ENCOUNTER — Encounter (HOSPITAL_COMMUNITY)
Admission: RE | Disposition: A | Discharge status: Home / Self Care | Payer: Self-pay | Source: Home / Self Care | Attending: Gastroenterology

## 2019-06-11 ENCOUNTER — Other Ambulatory Visit: Payer: Self-pay

## 2019-06-11 ENCOUNTER — Encounter (HOSPITAL_COMMUNITY): Payer: Self-pay | Admitting: Gastroenterology

## 2019-06-11 ENCOUNTER — Ambulatory Visit (HOSPITAL_COMMUNITY)
Admission: RE | Admit: 2019-06-11 | Discharge: 2019-06-11 | Disposition: A | Discharge status: Home / Self Care | Payer: Medicare Other | Attending: Gastroenterology | Admitting: Gastroenterology

## 2019-06-11 DIAGNOSIS — K838 Other specified diseases of biliary tract: Secondary | ICD-10-CM | POA: Insufficient documentation

## 2019-06-11 DIAGNOSIS — M419 Scoliosis, unspecified: Secondary | ICD-10-CM | POA: Insufficient documentation

## 2019-06-11 DIAGNOSIS — R569 Unspecified convulsions: Secondary | ICD-10-CM | POA: Insufficient documentation

## 2019-06-11 DIAGNOSIS — Z90710 Acquired absence of both cervix and uterus: Secondary | ICD-10-CM | POA: Diagnosis not present

## 2019-06-11 DIAGNOSIS — F17200 Nicotine dependence, unspecified, uncomplicated: Secondary | ICD-10-CM | POA: Insufficient documentation

## 2019-06-11 DIAGNOSIS — Z886 Allergy status to analgesic agent status: Secondary | ICD-10-CM | POA: Insufficient documentation

## 2019-06-11 DIAGNOSIS — G8929 Other chronic pain: Secondary | ICD-10-CM | POA: Diagnosis not present

## 2019-06-11 DIAGNOSIS — Z91018 Allergy to other foods: Secondary | ICD-10-CM | POA: Insufficient documentation

## 2019-06-11 DIAGNOSIS — Z9049 Acquired absence of other specified parts of digestive tract: Secondary | ICD-10-CM | POA: Diagnosis not present

## 2019-06-11 DIAGNOSIS — K8050 Calculus of bile duct without cholangitis or cholecystitis without obstruction: Secondary | ICD-10-CM | POA: Diagnosis not present

## 2019-06-11 DIAGNOSIS — Z888 Allergy status to other drugs, medicaments and biological substances status: Secondary | ICD-10-CM | POA: Diagnosis not present

## 2019-06-11 DIAGNOSIS — Z8249 Family history of ischemic heart disease and other diseases of the circulatory system: Secondary | ICD-10-CM | POA: Insufficient documentation

## 2019-06-11 DIAGNOSIS — Z8042 Family history of malignant neoplasm of prostate: Secondary | ICD-10-CM | POA: Insufficient documentation

## 2019-06-11 DIAGNOSIS — M797 Fibromyalgia: Secondary | ICD-10-CM | POA: Diagnosis not present

## 2019-06-11 DIAGNOSIS — M549 Dorsalgia, unspecified: Secondary | ICD-10-CM | POA: Diagnosis not present

## 2019-06-11 HISTORY — PX: UPPER ESOPHAGEAL ENDOSCOPIC ULTRASOUND (EUS): SHX6562

## 2019-06-11 HISTORY — PX: REMOVAL OF STONES: SHX5545

## 2019-06-11 HISTORY — PX: ENDOSCOPIC RETROGRADE CHOLANGIOPANCREATOGRAPHY (ERCP) WITH PROPOFOL: SHX5810

## 2019-06-11 SURGERY — UPPER ESOPHAGEAL ENDOSCOPIC ULTRASOUND (EUS)
Anesthesia: General

## 2019-06-11 MED ORDER — FENTANYL CITRATE (PF) 250 MCG/5ML IJ SOLN
INTRAMUSCULAR | Status: DC | PRN
  Administered 2019-06-11 (×2): 50 ug via INTRAVENOUS
  Administered 2019-06-11: 100 ug via INTRAVENOUS

## 2019-06-11 MED ORDER — PROPOFOL 10 MG/ML IV BOLUS
INTRAVENOUS | Status: DC | PRN
  Administered 2019-06-11: 120 mg via INTRAVENOUS

## 2019-06-11 MED ORDER — PROPOFOL 10 MG/ML IV BOLUS
INTRAVENOUS | Status: AC
  Filled 2019-06-11: qty 20

## 2019-06-11 MED ORDER — DEXAMETHASONE SODIUM PHOSPHATE 10 MG/ML IJ SOLN
INTRAMUSCULAR | Status: DC | PRN
  Administered 2019-06-11: 10 mg via INTRAVENOUS

## 2019-06-11 MED ORDER — CIPROFLOXACIN IN D5W 400 MG/200ML IV SOLN
INTRAVENOUS | Status: AC
  Filled 2019-06-11: qty 200

## 2019-06-11 MED ORDER — FENTANYL CITRATE (PF) 100 MCG/2ML IJ SOLN
INTRAMUSCULAR | Status: AC
  Filled 2019-06-11: qty 2

## 2019-06-11 MED ORDER — SODIUM CHLORIDE 0.9 % IV SOLN: INTRAVENOUS | Status: DC

## 2019-06-11 MED ORDER — SODIUM CHLORIDE 0.9 % IV SOLN
INTRAVENOUS | Status: DC | PRN
  Administered 2019-06-11: 12:00:00 35 mL

## 2019-06-11 MED ORDER — SUGAMMADEX SODIUM 200 MG/2ML IV SOLN
INTRAVENOUS | Status: DC | PRN
  Administered 2019-06-11: 100 mg via INTRAVENOUS

## 2019-06-11 MED ORDER — CIPROFLOXACIN IN D5W 400 MG/200ML IV SOLN
400.0000 mg | Freq: Once | INTRAVENOUS | Status: AC
  Administered 2019-06-11: 400 mg via INTRAVENOUS

## 2019-06-11 MED ORDER — GLUCAGON HCL RDNA (DIAGNOSTIC) 1 MG IJ SOLR
INTRAMUSCULAR | Status: AC
  Filled 2019-06-11: qty 1

## 2019-06-11 MED ORDER — INDOMETHACIN 50 MG RE SUPP
RECTAL | Status: AC
  Filled 2019-06-11: qty 2

## 2019-06-11 MED ORDER — LIDOCAINE 2% (20 MG/ML) 5 ML SYRINGE
INTRAMUSCULAR | Status: DC | PRN
  Administered 2019-06-11: 100 mg via INTRAVENOUS

## 2019-06-11 MED ORDER — ROCURONIUM BROMIDE 10 MG/ML (PF) SYRINGE
PREFILLED_SYRINGE | INTRAVENOUS | Status: DC | PRN
  Administered 2019-06-11: 50 mg via INTRAVENOUS

## 2019-06-11 MED ORDER — ONDANSETRON HCL 4 MG/2ML IJ SOLN
INTRAMUSCULAR | Status: DC | PRN
  Administered 2019-06-11: 4 mg via INTRAVENOUS

## 2019-06-11 MED ORDER — LACTATED RINGERS IV SOLN
INTRAVENOUS | Status: DC
  Administered 2019-06-11: 1000 mL via INTRAVENOUS

## 2019-06-11 NOTE — H&P (Signed)
Sharon Welch HPI: The patient was recently hospitalizaed at [facility] for hematemesis and melena. Dr. Oneida Alar performed an EGD and found an ulcer with a visible vessel at the pylorus. There was oozing of blood and it was arrested with Epi and two hemoclips. The patient was taking Aleve and the bastric biopsies only showed a mild reactive gastropathy. No H. pylori was identified. Her HGB dropped from 13.3 g/dL on 05/14/2019 down to 6.2 g/dL on 05/16/2019. She was discharged with an HGB of 11.2 gd/L on 05/18/2019. During this admission she was also noted to have an elevated AP at 549. The values dropped down to 178 on the day of discharged. There was also a coincident elevation in her AST/ALT at 61/133, respectively. on 05/07/2019 Her liver panel was as follows: AST 303, ALT 207, AP 341, and TB 0.9. In 2014 she was evaluated for dilated bile ducts. She is s/p lap chole on 11/17/2012 with findings of cholelithiasis. An EUS was performed on 11/03/2012 and there was no evidence of choledocholithiasis. She represented on 05/06/2013 for an ERCP as she was noted to have an abnormal CT scan. The ERCP was difficult as the PD was cannulated. A sphincterotomy was created and there was no evidence of any stones. The MRCP on 05/17/2019 was suggestive of a distal filling defect. The patient was taking Aleve at the discretion of her pain physician as her opioids were not controlling her chronic back pain. She does have some history of epigastric pain, but she states that she is in pain constatnly. It is difficult for her to discern her pain complaints. Last week she reported having a syncopal episode and she was told that she may have fractured her left ribs. She does not know the reason for her syncope, but she suffered from this issue before she was hospitalized for her GI bleed.    Past Medical History:  Diagnosis Date  . Bruising, spontaneous 12/08/2013  . Chronic back pain   . Chronic  back pain   . DDD (degenerative disc disease), lumbar   . DDD (degenerative disc disease), lumbar   . Fibromyalgia   . Headache(784.0)   . Ovarian cyst   . Partial tear subscapularis tendon 12/27/2011  . Scoliosis   . Seizures ([place])    started 9/12-    Past Surgical History:  Procedure Laterality Date  . ABDOMINAL HYSTERECTOMY  2012  . BACK SURGERY  1989   scoliosis throsic-rods  . CESAREAN SECTION    . CHOLECYSTECTOMY N/A 11/17/2012   Procedure: LAPAROSCOPIC CHOLECYSTECTOMY WITH INTRAOPERATIVE CHOLANGIOGRAM;  Surgeon: Ralene Ok, MD;  Location: [place];  Service: General;  Laterality: N/A;  . ERCP N/A 05/07/2013   Procedure: ENDOSCOPIC RETROGRADE CHOLANGIOPANCREATOGRAPHY (ERCP);  Surgeon: Beryle Beams, MD;  Location: [facility];  Service: Endoscopy;  Laterality: N/A;  start ercp first, if canulation fails switch to EUS   . ESOPHAGOGASTRODUODENOSCOPY (EGD) WITH PROPOFOL N/A 05/15/2019   Procedure: ESOPHAGOGASTRODUODENOSCOPY (EGD) WITH PROPOFOL;  Surgeon: Danie Binder, MD;  Location: [facility];  Service: Endoscopy;  Laterality: N/A;  . EUS N/A 11/03/2012   Procedure: UPPER ENDOSCOPIC ULTRASOUND (EUS) LINEAR;  Surgeon: Beryle Beams, MD;  Location: [facility];  Service: Endoscopy;  Laterality: N/A;  . left arm    . NECK SURGERY    . TUBAL LIGATION      Family History  Problem Relation Age of Onset  . Hypertension Mother   . Hypertension Father   . Cancer Father  prostate  . Heart disease Father   . Hypertension Brother   . Hypertension Brother     Social History:  reports that she has been smoking cigarettes. She has a 10.00 pack-year smoking history. She has never used smokeless tobacco. She reports that she does not drink alcohol or use drugs.  Allergies:  Allergies  Allergen Reactions  . Coconut Flavor Swelling  . Tramadol Other (See Comments)    Possible seizures  . Aleve [Naproxen] Other (See Comments)    Ulcers  . Pineapple Other (See  Comments)    Migraine    Medications:  Scheduled:  Continuous: . sodium chloride    . lactated ringers 1,000 mL (06/11/19 0753)    No results found for this or any previous visit (from the past 24 hour(s)).   No results found.  ROS:  As stated above in the HPI otherwise negative.  Blood pressure 122/69, pulse 81, temperature 98 F (36.7 C), temperature source Oral, resp. rate 12, height 5' 0.5" (1.537 m), weight 40.5 kg, SpO2 100 %.    PE: Gen: NAD, Alert and Oriented HEENT:  [place]/AT, EOMI Neck: Supple, no LAD Lungs: CTA Bilaterally CV: RRR without M/G/R ABM: Soft, NTND, +BS Ext: No C/C/E  Assessment/Plan: 1) Choledocholithiasis - EUS +/- ERCP. 2) Abnormal liver enzymes.  Jaime Dome D 06/11/2019, 8:35 AM

## 2019-06-11 NOTE — Anesthesia Procedure Notes (Addendum)
Procedure Name: Intubation Date/Time: 06/11/2019 8:57 AM Performed by: Lollie Sails, CRNA Pre-anesthesia Checklist: Patient identified, Emergency Drugs available, Suction available, Patient being monitored and Timeout performed Patient Re-evaluated:Patient Re-evaluated prior to induction Oxygen Delivery Method: Circle system utilized Preoxygenation: Pre-oxygenation with 100% oxygen Induction Type: IV induction Ventilation: Mask ventilation without difficulty Laryngoscope Size: Miller and 3 Grade View: Grade I Tube type: Oral Tube size: 7.0 mm Number of attempts: 1 Airway Equipment and Method: Stylet Placement Confirmation: ETT inserted through vocal cords under direct vision,  positive ETCO2 and breath sounds checked- equal and bilateral Secured at: 19 cm Tube secured with: Tape Dental Injury: Teeth and Oropharynx as per pre-operative assessment

## 2019-06-11 NOTE — Discharge Instructions (Signed)

## 2019-06-11 NOTE — Anesthesia Preprocedure Evaluation (Addendum)
Anesthesia Evaluation  Patient identified by MRN, date of birth, ID band Patient awake    Reviewed: Allergy & Precautions, NPO status , Patient's Chart, lab work & pertinent test results  Airway Mallampati: I  TM Distance: >3 FB Neck ROM: Full    Dental   Pulmonary Current Smoker and Patient abstained from smoking.,    Pulmonary exam normal        Cardiovascular Normal cardiovascular exam     Neuro/Psych Seizures -,     GI/Hepatic   Endo/Other    Renal/GU      Musculoskeletal   Abdominal   Peds  Hematology   Anesthesia Other Findings   Reproductive/Obstetrics                             Anesthesia Physical Anesthesia Plan  ASA: II  Anesthesia Plan: General   Post-op Pain Management:    Induction: Intravenous  PONV Risk Score and Plan: 2 and Treatment may vary due to age or medical condition and Ondansetron  Airway Management Planned: Oral ETT  Additional Equipment:   Intra-op Plan:   Post-operative Plan: Extubation in OR  Informed Consent: I have reviewed the patients History and Physical, chart, labs and discussed the procedure including the risks, benefits and alternatives for the proposed anesthesia with the patient or authorized representative who has indicated his/her understanding and acceptance.       Plan Discussed with: CRNA and Surgeon  Anesthesia Plan Comments:        Anesthesia Quick Evaluation

## 2019-06-11 NOTE — Anesthesia Postprocedure Evaluation (Addendum)
Anesthesia Post Note  Patient: Sharon Welch  Procedure(s) Performed: UPPER ESOPHAGEAL ENDOSCOPIC ULTRASOUND (EUS) (N/A ) ENDOSCOPIC RETROGRADE CHOLANGIOPANCREATOGRAPHY (ERCP) WITH PROPOFOL (N/A )     Patient location during evaluation: PACU Anesthesia Type: General Level of consciousness: awake and alert Pain management: pain level controlled Vital Signs Assessment: post-procedure vital signs reviewed and stable Respiratory status: spontaneous breathing, nonlabored ventilation, respiratory function stable and patient connected to nasal cannula oxygen Cardiovascular status: stable and blood pressure returned to baseline Postop Assessment: no apparent nausea or vomiting Anesthetic complications: no    Last Vitals:  Vitals:   06/11/19 1000 06/11/19 1020  BP: 132/67 118/61  Pulse: 89 89  Resp: 18 14  Temp: 36.4 C   SpO2: 100% 100%    Last Pain:  Vitals:   06/11/19 1020  TempSrc:   PainSc: 2                  Gerrianne Aydelott DAVID

## 2019-06-11 NOTE — Transfer of Care (Signed)
Immediate Anesthesia Transfer of Care Note  Patient: Sharon Welch  Procedure(s) Performed: UPPER ESOPHAGEAL ENDOSCOPIC ULTRASOUND (EUS) (N/A ) ENDOSCOPIC RETROGRADE CHOLANGIOPANCREATOGRAPHY (ERCP) WITH PROPOFOL (N/A )  Patient Location: PACU and Endoscopy Unit  Anesthesia Type:General  Level of Consciousness: awake, drowsy and responds to stimulation  Airway & Oxygen Therapy: Patient Spontanous Breathing and Patient connected to face mask oxygen  Post-op Assessment: Report given to RN and Post -op Vital signs reviewed and stable  Post vital signs: Reviewed and stable  Last Vitals:  Vitals Value Taken Time  BP 125/79 06/11/19 0957  Temp    Pulse 89 06/11/19 0959  Resp 20 06/11/19 0959  SpO2 100 % 06/11/19 0959  Vitals shown include unvalidated device data.  Last Pain:  Vitals:   06/11/19 0748  TempSrc: Oral  PainSc: 4          Complications: No apparent anesthesia complications

## 2019-06-11 NOTE — Op Note (Signed)
[facility] [name] Procedure Date: 06/11/2019 MRN: [id number] Attending MD: Carol Ada , MD Date of Birth: [birth year] CSN: [id number] Age: 48 Admit Type: Outpatient Procedure:                Upper EUS Indications:              Common bile duct dilation (acquired) seen on MRCP Providers:                Carol Ada, MD Referring MD:              Medicines:                General Anesthesia Complications:            No immediate complications. Estimated Blood Loss:     Estimated blood loss: none. Procedure:                Pre-Anesthesia Assessment:                           - Prior to the procedure, a History and Physical                            was performed, and patient medications and                            allergies were reviewed. The patient's tolerance of                            previous anesthesia was also reviewed. The risks                            and benefits of the procedure and the sedation                            options and risks were discussed with the patient.                            All questions were answered, and informed consent                            was obtained. Prior Anticoagulants: The patient has                            taken no previous anticoagulant or antiplatelet                            agents. ASA Grade Assessment: III - A patient with                            severe systemic disease. After reviewing the risks                            and benefits, the patient was deemed in  satisfactory condition to undergo the procedure.                           - Sedation was administered by an anesthesia                            professional. General anesthesia was attained.                           After obtaining informed consent, the endoscope was                            passed under direct vision. Throughout the                            procedure, the patient's  blood pressure, pulse, and                            oxygen saturations were monitored continuously. The                            [id number] Olympus Forward View EUS was                            introduced through the mouth, and advanced to the                            second part of duodenum. The upper EUS was                            accomplished without difficulty. The patient                            tolerated the procedure well. Scope In: Scope Out: Findings:      ENDOSONOGRAPHIC FINDING: :      There was dilation in the common bile duct which measured up to 13 mm.      The CBD was dilated up to 13 mm, which was consistent with the recent       MRCP findings as well as the prior EUS/ERCP findings in 2014. In the       distal CBD there was some evidence of some possibly shadowing defect.       This was viewed from the bulb, but there was no abnormality noted in the       second portion of the duodenum view. Impression:               - There was dilation in the common bile duct which                            measured up to 13 mm.                           - No specimens collected. Moderate Sedation:      Not Applicable - Patient had care per Anesthesia. Recommendation:           -  Convert the procedure to an ERCP. Procedure Code(s):        --- Professional ---                           [phone], Esophagogastroduodenoscopy, flexible,                            transoral; with endoscopic ultrasound examination                            limited to the esophagus, stomach or duodenum, and                            adjacent structures Diagnosis Code(s):        --- Professional ---                           K83.8, Other specified diseases of biliary tract CPT copyright 2019 American Medical Association. All rights reserved. The codes documented in this report are preliminary and upon coder review may  be revised to meet current compliance requirements. Carol Ada,  MD Carol Ada, MD 06/11/2019 10:00:09 AM This report has been signed electronically. Number of Addenda: 0

## 2019-06-11 NOTE — Op Note (Addendum)
[facility] [name] Procedure Date: 06/11/2019 MRN: [id number] Attending MD: Carol Ada , MD Date of Birth: Mar 30, 1971 CSN: [id number] Age: 48 Admit Type: Outpatient Procedure:                ERCP Indications:              Common bile duct stone(s) Providers:                Carol Ada, MD Referring MD:              Medicines:                General Anesthesia Complications:            No immediate complications. Estimated Blood Loss:     Estimated blood loss: none. Procedure:                Pre-Anesthesia Assessment:                           - Prior to the procedure, a History and Physical                            was performed, and patient medications and                            allergies were reviewed. The patient's tolerance of                            previous anesthesia was also reviewed. The risks                            and benefits of the procedure and the sedation                            options and risks were discussed with the patient.                            All questions were answered, and informed consent                            was obtained. Prior Anticoagulants: The patient has                            taken no previous anticoagulant or antiplatelet                            agents. ASA Grade Assessment: III - A patient with                            severe systemic disease. After reviewing the risks                            and benefits, the patient was deemed in                            satisfactory  condition to undergo the procedure.                           - Sedation was administered by an anesthesia                            professional. General anesthesia was attained.                           After obtaining informed consent, the scope was                            passed under direct vision. Throughout the                            procedure, the patient's blood pressure, pulse, and                         oxygen saturations were monitored continuously. The                            [id number]) Olympus duodenoscope was                            introduced through the mouth, and used to inject                            contrast into and used to inject contrast into the                            bile duct. The ERCP was accomplished without                            difficulty. The patient tolerated the procedure                            well. Findings:      The major papilla was normal. A short 0.035 inch Soft Jagwire was passed       into the biliary tree. The biliary tree was swept with a 12 mm balloon       starting at the bifurcation. Nothing was found.      Cannulation was performed with ease as she had a prior sphincterotomy.       The guidewire was advanced and secured in the central hepatic ducts.       There was no manipulation of the PD. Contrast injection showed a       markedly dilated CBD at 13 mm, which was consistent with the prior       findings. Fluoroscopic imaging was difficult as she had spinal hardware       in position. Before the contrast injection there was some evidence of       debris in the lumen of the spinchtertome, but sweeping the duct with the       extraction balloon did not yield any stones or sludge. Teh duct was       swept twice and a final occlusion  cholangiogram was performed, which was       negative for any retained stones. Impression:               - The major papilla appeared normal.                           - The biliary tree was swept and nothing was found. Moderate Sedation:      Not Applicable - Patient had care per Anesthesia. Recommendation:           - Patient has a contact number available for                            emergencies. The signs and symptoms of potential                            delayed complications were discussed with the                            patient. Return to normal activities  tomorrow.                            Written discharge instructions were provided to the                            patient.                           - Discharge patient to home (ambulatory).                           - Return to GI clinic PRN. Procedure Code(s):        --- Professional ---                           [phone], Endoscopic retrograde                            cholangiopancreatography (ERCP); diagnostic,                            including collection of specimen(s) by brushing or                            washing, when performed (separate procedure) Diagnosis Code(s):        --- Professional ---                           K80.50, Calculus of bile duct without cholangitis                            or cholecystitis without obstruction CPT copyright 2019 American Medical Association. All rights reserved. The codes documented in this report are preliminary and upon coder review may  be revised to meet current compliance requirements. Carol Ada, MD Carol Ada, MD 06/11/2019 10:05:47 AM This report has been signed electronically. Number of Addenda: 0

## 2019-06-14 ENCOUNTER — Encounter: Payer: Self-pay | Admitting: *Deleted

## 2019-06-15 ENCOUNTER — Encounter: Payer: Self-pay | Admitting: Anesthesiology

## 2019-06-15 NOTE — Addendum Note (Signed)
Addendum  created 06/15/19 1429 by Lillia Abed, MD   Clinical Note Signed, SmartForm saved

## 2019-07-07 ENCOUNTER — Other Ambulatory Visit: Payer: Self-pay

## 2019-07-07 ENCOUNTER — Emergency Department (HOSPITAL_COMMUNITY)
Admission: EM | Admit: 2019-07-07 | Discharge: 2019-07-07 | Disposition: A | Discharge status: Home / Self Care | Payer: Medicare Other | Attending: Emergency Medicine | Admitting: Emergency Medicine

## 2019-07-07 ENCOUNTER — Encounter (HOSPITAL_COMMUNITY): Payer: Self-pay | Admitting: Emergency Medicine

## 2019-07-07 DIAGNOSIS — F17210 Nicotine dependence, cigarettes, uncomplicated: Secondary | ICD-10-CM | POA: Insufficient documentation

## 2019-07-07 DIAGNOSIS — K270 Acute peptic ulcer, site unspecified, with hemorrhage: Secondary | ICD-10-CM | POA: Insufficient documentation

## 2019-07-07 DIAGNOSIS — Z79899 Other long term (current) drug therapy: Secondary | ICD-10-CM | POA: Diagnosis not present

## 2019-07-07 DIAGNOSIS — K625 Hemorrhage of anus and rectum: Secondary | ICD-10-CM | POA: Insufficient documentation

## 2019-07-07 LAB — COMPREHENSIVE METABOLIC PANEL
ALT: 22 U/L (ref 0–44)
AST: 23 U/L (ref 15–41)
Albumin: 4.8 g/dL (ref 3.5–5.0)
Alkaline Phosphatase: 152 U/L — ABNORMAL HIGH (ref 38–126)
Anion gap: 13 (ref 5–15)
BUN: 21 mg/dL — ABNORMAL HIGH (ref 6–20)
CO2: 26 mmol/L (ref 22–32)
Calcium: 9.7 mg/dL (ref 8.9–10.3)
Chloride: 99 mmol/L (ref 98–111)
Creatinine, Ser: 0.79 mg/dL (ref 0.44–1.00)
GFR calc Af Amer: >60 mL/min (ref >60)
GFR calc non Af Amer: >60 mL/min (ref >60)
Glucose, Bld: 113 mg/dL — ABNORMAL HIGH (ref 70–99)
Potassium: 4.2 mmol/L (ref 3.5–5.1)
Sodium: 138 mmol/L (ref 135–145)
Total Bilirubin: 0.7 mg/dL (ref 0.3–1.2)
Total Protein: 8.2 g/dL — ABNORMAL HIGH (ref 6.5–8.1)

## 2019-07-07 LAB — CBC
HCT: 47.6 % — ABNORMAL HIGH (ref 36.0–46.0)
Hemoglobin: 15.8 g/dL — ABNORMAL HIGH (ref 12.0–15.0)
MCH: 31.2 pg (ref 26.0–34.0)
MCHC: 33.2 g/dL (ref 30.0–36.0)
MCV: 93.9 fL (ref 80.0–100.0)
Platelets: 339 10*3/uL (ref 150–400)
RBC: 5.07 MIL/uL (ref 3.87–5.11)
RDW: 12.6 % (ref 11.5–15.5)
WBC: 9.1 10*3/uL (ref 4.0–10.5)
nRBC: 0 % (ref 0.0–0.2)

## 2019-07-07 LAB — TYPE AND SCREEN
ABO/RH(D): B POS
Antibody Screen: NEGATIVE

## 2019-07-07 LAB — POC OCCULT BLOOD, ED: Fecal Occult Bld: POSITIVE — AB

## 2019-07-07 LAB — POC URINE PREG, ED: Preg Test, Ur: NEGATIVE

## 2019-07-07 MED ORDER — BISACODYL 5 MG PO TBEC: 5.0000 mg | DELAYED_RELEASE_TABLET | Freq: Two times a day (BID) | ORAL | 0 refills | Status: DC

## 2019-07-07 MED ORDER — MAGNESIUM CITRATE PO SOLN: 1.0000 | Freq: Once | ORAL | 0 refills | Status: AC

## 2019-07-07 NOTE — Discharge Instructions (Signed)
Please take your medications, as prescribed.  Please read the instructions on how to take a sitz bath.  Also encourage you to use a topical ointment for your suspected hemorrhoid, as needed.  I recommend Preparation H over-the-counter at a pharmacy or a similar generic compound.  Please contact your gastroenterologist to schedule a follow-up appointment.  You will likely need to schedule a colonoscopy and may also consider repeat endoscopy given your recent peptic ulcer disease.  Please return to the ED or seek medical attention immediately for any new or worsening symptoms.

## 2019-07-07 NOTE — ED Triage Notes (Signed)
Patient reports surgery in Nov for GI bleed. Bands placed. Patient states she woke this am with bright red rectal bleeding.

## 2019-07-07 NOTE — ED Provider Notes (Signed)
[facility] EMERGENCY DEPARTMENT Provider Note   CSN: [id number] Arrival date & time: 07/07/19  1127     History Chief Complaint  Patient presents with  . Rectal Bleeding    Sharon Welch is a 49 y.o. female with PMH significant for recent admission to hospital 05/15/2019 for acute GI bleed due to peptic ulcer disease requiring 2 hemostatic clips placed, IV Protonix, and transfusion who presents to the ED with complaints of BRBPR this morning shortly after waking.  I reviewed patient's past medical record and she has had to have her first colonoscopy.  She tells me that she has been feeling constipated ever since her discharge from the hospital.  She endorses hard, nonmelanotic stools.  This morning she reports a "decent amount" of BRBPR following an uncomfortable bowel movement.  Given her recent history, her family insisted that she go to the ER for evaluation.  She denies any significant headache, dizziness, weakness, difficulty breathing, significant chest pain, abdominal pain, nausea or vomiting, hematemesis, or melena.  Patient reports that she feels embarrassed coming in for what she suspects to be hemorrhoids.  HPI     Past Medical History:  Diagnosis Date  . Bruising, spontaneous 12/08/2013  . Chronic back pain   . Chronic back pain   . DDD (degenerative disc disease), lumbar   . DDD (degenerative disc disease), lumbar   . Fibromyalgia   . Headache(784.0)   . Ovarian cyst   . Partial tear subscapularis tendon 12/27/2011  . Scoliosis   . Seizures ([place])    started 9/12-    Patient Active Problem List   Diagnosis Date Noted  . Acute GI bleed due to bleeding pyloric/gastric ulcer 05/15/2019  . PUD (peptic ulcer disease)-EGD 05/15/2019 with bleeding pyloric/gastric ulcer, nonbleeding duodenal ulcers and gastritis   . Hematemesis with nausea   . Migraine headache 09/25/2015  . 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 Bilateral occipital neuralgia 01/02/2015  .  Migraine 01/02/2015  . 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 DDD (degenerative disc disease), cervical 10/27/2014  . 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 DDD (degenerative disc disease), lumbosacral 10/27/2014  . Partial tear subscapularis tendon 12/27/2011    Past Surgical History:  Procedure Laterality Date  . ABDOMINAL HYSTERECTOMY  2012  . BACK SURGERY  1989   scoliosis throsic-rods  . CESAREAN SECTION    . CHOLECYSTECTOMY N/A 11/17/2012   Procedure: LAPAROSCOPIC CHOLECYSTECTOMY WITH INTRAOPERATIVE CHOLANGIOGRAM;  Surgeon: Ralene Ok, MD;  Location: [place];  Service: General;  Laterality: N/A;  . ENDOSCOPIC RETROGRADE CHOLANGIOPANCREATOGRAPHY (ERCP) WITH PROPOFOL N/A 06/11/2019   Procedure: ENDOSCOPIC RETROGRADE CHOLANGIOPANCREATOGRAPHY (ERCP) WITH PROPOFOL;  Surgeon: Carol Ada, MD;  Location: [facility];  Service: Endoscopy;  Laterality: N/A;  . ERCP N/A 05/07/2013   Procedure: ENDOSCOPIC RETROGRADE CHOLANGIOPANCREATOGRAPHY (ERCP);  Surgeon: Beryle Beams, MD;  Location: [facility];  Service: Endoscopy;  Laterality: N/A;  start ercp first, if canulation fails switch to EUS   . ESOPHAGOGASTRODUODENOSCOPY (EGD) WITH PROPOFOL N/A 05/15/2019   Procedure: ESOPHAGOGASTRODUODENOSCOPY (EGD) WITH PROPOFOL;  Surgeon: Danie Binder, MD;  Location: [facility];  Service: Endoscopy;  Laterality: N/A;  . EUS N/A 11/03/2012   Procedure: UPPER ENDOSCOPIC ULTRASOUND (EUS) LINEAR;  Surgeon: Beryle Beams, MD;  Location: [facility];  Service: Endoscopy;  Laterality: N/A;  . left arm    . NECK SURGERY    . REMOVAL OF STONES  06/11/2019   Procedure: REMOVAL OF STONES;  Surgeon: Carol Ada, MD;  Location: [facility];  Service: Endoscopy;;  . TUBAL LIGATION    . UPPER ESOPHAGEAL ENDOSCOPIC ULTRASOUND (EUS) N/A 06/11/2019   Procedure: UPPER ESOPHAGEAL ENDOSCOPIC ULTRASOUND (EUS);  Surgeon: Carol Ada, MD;  Location: [facility];  Service: Endoscopy;  Laterality:  N/A;     OB History    Gravida      Para      Term      Preterm      AB      Living  2     SAB      TAB      Ectopic      Multiple      Live Births              Family History  Problem Relation Age of Onset  . Hypertension Mother   . Hypertension Father   . Cancer Father        prostate  . Heart disease Father   . Hypertension Brother   . Hypertension Brother     Social History   Tobacco Use  . Smoking status: Current Every Day Smoker    Packs/day: 0.50    Years: 20.00    Pack years: 10.00    Types: Cigarettes  . Smokeless tobacco: Never Used  Substance Use Topics  . Alcohol use: No    Alcohol/week: 0.0 standard drinks    Comment: 1-2 sips a years  . Drug use: No    Home Medications Prior to Admission medications   Medication Sig Start Date End Date Taking? Authorizing Provider  bisacodyl (DULCOLAX) 5 MG EC tablet Take 1 tablet (5 mg total) by mouth 2 (two) times daily. 07/07/19   Corena Herter, PA-C  calcium-vitamin D (OSCAL WITH D) 250-125 MG-UNIT tablet Take 1 tablet by mouth 2 (two) times daily.    [provider]  docusate sodium (COLACE) 100 MG capsule Take 100 mg by mouth 2 (two) times daily.    [provider]  HYDROcodone-acetaminophen (NORCO) 10-325 MG tablet Take 1 tablet by mouth every 4 (four) hours as needed for moderate pain or severe pain.    [provider]  levETIRAcetam (KEPPRA) 500 MG tablet Take 1 tablet (500 mg total) by mouth 2 (two) times daily. 05/07/19   Nat Christen, MD  magnesium citrate SOLN Take 296 mLs (1 Bottle total) by mouth once for 1 dose. 07/07/19 07/07/19  Corena Herter, PA-C  Milnacipran (SAVELLA) 50 MG TABS tablet Take 50 mg by mouth 2 (two) times daily.    [provider]  Multiple Vitamins-Minerals (MULTIVITAMIN WITH MINERALS) tablet Take 1 tablet by mouth daily.    [provider]  nicotine (NICODERM CQ - DOSED IN MG/24 HOURS) 14 mg/24hr patch Place 1 patch  (14 mg total) onto the skin daily. 05/19/19   Roxan Hockey, MD  ondansetron (ZOFRAN) 4 MG tablet Take 1 tablet (4 mg total) by mouth every 6 (six) hours as needed for nausea or vomiting. 05/18/19   Roxan Hockey, MD  pantoprazole (PROTONIX) 40 MG tablet Take 1 tablet (40 mg total) by mouth 2 (two) times daily before a meal. Patient taking differently: Take 40 mg by mouth 2 (two) times daily.  05/18/19   Roxan Hockey, MD  tiZANidine (ZANAFLEX) 2 MG tablet Take 2-4 mg by mouth See admin instructions. Up to 12 mg daily    [provider]  traZODone (DESYREL) 150 MG tablet Take 1 tablet (150 mg total) by mouth at bedtime. 05/18/19  Roxan Hockey, MD    Allergies    Coconut flavor, Tramadol, Aleve [naproxen], and Pineapple  Review of Systems   Review of Systems  All other systems reviewed and are negative.   Physical Exam Updated Vital Signs BP (!) 143/66 (BP Location: Left Arm)   Pulse 96   Temp 98.6 F (37 C) (Oral)   Resp 15   Ht 5\' 1"  (1.549 m)   Wt 42.6 kg   SpO2 100%   BMI 17.76 kg/m   Physical Exam Vitals and nursing note reviewed. Exam conducted with a chaperone present.  Constitutional:      General: She is not in acute distress.    Appearance: Normal appearance. She is not ill-appearing.  HENT:     Head: Normocephalic and atraumatic.  Eyes:     General: No scleral icterus.    Conjunctiva/sclera: Conjunctivae normal.  Cardiovascular:     Rate and Rhythm: Regular rhythm. Tachycardia present.     Pulses: Normal pulses.     Heart sounds: Normal heart sounds.  Pulmonary:     Effort: Pulmonary effort is normal.     Breath sounds: Normal breath sounds.  Genitourinary:    Comments: No BRBPR on my exam.  No obvious fissures.  Small mass suspicious for hemorrhoid.  No melanotic stool.  No obstruction in rectal vault.  No internal masses appreciated. Musculoskeletal:     Cervical back: Normal range of motion and neck supple. No rigidity.  Skin:     General: Skin is dry.  Neurological:     General: No focal deficit present.     Mental Status: She is alert and oriented to person, place, and time.     GCS: GCS eye subscore is 4. GCS verbal subscore is 5. GCS motor subscore is 6.  Psychiatric:        Mood and Affect: Mood normal.        Behavior: Behavior normal.        Thought Content: Thought content normal.     ED Results / Procedures / Treatments   Labs (all labs ordered are listed, but only abnormal results are displayed) Labs Reviewed  COMPREHENSIVE METABOLIC PANEL - Abnormal; Notable for the following components:      Result Value   Glucose, Bld 113 (*)    BUN 21 (*)    Total Protein 8.2 (*)    Alkaline Phosphatase 152 (*)    All other components within normal limits  CBC - Abnormal; Notable for the following components:   Hemoglobin 15.8 (*)    HCT 47.6 (*)    All other components within normal limits  POC OCCULT BLOOD, ED - Abnormal; Notable for the following components:   Fecal Occult Bld POSITIVE (*)    All other components within normal limits  POC URINE PREG, ED  TYPE AND SCREEN    EKG None  Radiology No results found.  Procedures Procedures (including critical care time)  Medications Ordered in ED Medications - No data to display  ED Course  I have reviewed the triage vital signs and the nursing notes.  Pertinent labs & imaging results that were available during my care of the patient were reviewed by me and considered in my medical decision making (see chart for details).    MDM Rules/Calculators/A&P                      CBC demonstrates hemoglobin elevated at 15.8, increased from 11.8 in labs obtained  1 month ago.  CMP demonstrates elevated alkaline phosphate, but improved from prior labs.  No other significant abnormalities.  Physical exam is reassuring.  Patient is in no acute distress.  She is hemodynamically stable, albeit mildly tachycardic.  She states that she is anxious about being in  here and embarrassed about her suspected hemorrhoids.  Rectal exam demonstrated no obvious melanotic stool or BRBPR.  Possible evidence of small hemorrhoid, will treat her chronic constipation with stool softeners and magnesium citrate.  She reports that she is due for a follow-up with her gastroenterologist.  Encouraged her to schedule a colonoscopy +/-repeat endoscopy with her gastroenterologist as soon as possible.  I also encouraged her to follow-up with her primary care provider for repeat lab work obtained in 3 days due to her bleeding.  She reports that her parents had a history of diverticulitis.  I have low suspicion for that at this time given her history of constipation and small mass concern for hemorrhoid on physical exam being likely culprit.  Discussed strict return precautions with the patient. All of the evaluation and work-up results were discussed with the patient and any family at bedside. They were provided opportunity to ask any additional questions and have none at this time. They have expressed understanding of verbal discharge instructions as well as return precautions and are agreeable to the plan.    Final Clinical Impression(s) / ED Diagnoses Final diagnoses:  Rectal bleeding    Rx / DC Orders ED Discharge Orders         Ordered    magnesium citrate SOLN   Once     07/07/19 1346    bisacodyl (DULCOLAX) 5 MG EC tablet  2 times daily     07/07/19 1346           Reita Chard 07/07/19 1350    Elnora Morrison, MD 07/12/19 1313

## 2019-07-19 DIAGNOSIS — R079 Chest pain, unspecified: Secondary | ICD-10-CM | POA: Diagnosis not present

## 2019-07-19 DIAGNOSIS — K5900 Constipation, unspecified: Secondary | ICD-10-CM | POA: Diagnosis not present

## 2019-07-27 DIAGNOSIS — G894 Chronic pain syndrome: Secondary | ICD-10-CM | POA: Diagnosis not present

## 2019-07-27 DIAGNOSIS — G40319 Generalized idiopathic epilepsy and epileptic syndromes, intractable, without status epilepticus: Secondary | ICD-10-CM | POA: Diagnosis not present

## 2019-07-27 DIAGNOSIS — G43019 Migraine without aura, intractable, without status migrainosus: Secondary | ICD-10-CM | POA: Diagnosis not present

## 2019-07-27 DIAGNOSIS — G40219 Localization-related (focal) (partial) symptomatic epilepsy and epileptic syndromes with complex partial seizures, intractable, without status epilepticus: Secondary | ICD-10-CM | POA: Diagnosis not present

## 2019-07-28 DIAGNOSIS — G43909 Migraine, unspecified, not intractable, without status migrainosus: Secondary | ICD-10-CM | POA: Diagnosis not present

## 2019-07-28 DIAGNOSIS — Z5181 Encounter for therapeutic drug level monitoring: Secondary | ICD-10-CM | POA: Diagnosis not present

## 2019-07-28 DIAGNOSIS — M5030 Other cervical disc degeneration, unspecified cervical region: Secondary | ICD-10-CM | POA: Diagnosis not present

## 2019-07-28 DIAGNOSIS — M419 Scoliosis, unspecified: Secondary | ICD-10-CM | POA: Diagnosis not present

## 2019-08-24 DIAGNOSIS — M5030 Other cervical disc degeneration, unspecified cervical region: Secondary | ICD-10-CM | POA: Diagnosis not present

## 2019-08-24 DIAGNOSIS — G43909 Migraine, unspecified, not intractable, without status migrainosus: Secondary | ICD-10-CM | POA: Diagnosis not present

## 2019-08-24 DIAGNOSIS — Z5181 Encounter for therapeutic drug level monitoring: Secondary | ICD-10-CM | POA: Diagnosis not present

## 2019-08-24 DIAGNOSIS — M47897 Other spondylosis, lumbosacral region: Secondary | ICD-10-CM | POA: Diagnosis not present

## 2019-08-24 DIAGNOSIS — G4700 Insomnia, unspecified: Secondary | ICD-10-CM | POA: Diagnosis not present

## 2019-08-24 DIAGNOSIS — M79609 Pain in unspecified limb: Secondary | ICD-10-CM | POA: Diagnosis not present

## 2019-08-24 DIAGNOSIS — M419 Scoliosis, unspecified: Secondary | ICD-10-CM | POA: Diagnosis not present

## 2019-08-24 DIAGNOSIS — F419 Anxiety disorder, unspecified: Secondary | ICD-10-CM | POA: Diagnosis not present

## 2019-08-24 DIAGNOSIS — M48062 Spinal stenosis, lumbar region with neurogenic claudication: Secondary | ICD-10-CM | POA: Diagnosis not present

## 2019-08-24 DIAGNOSIS — M25559 Pain in unspecified hip: Secondary | ICD-10-CM | POA: Diagnosis not present

## 2019-08-24 DIAGNOSIS — G894 Chronic pain syndrome: Secondary | ICD-10-CM | POA: Diagnosis not present

## 2019-08-31 ENCOUNTER — Ambulatory Visit (HOSPITAL_COMMUNITY)
Admission: RE | Admit: 2019-08-31 | Discharge: 2019-08-31 | Disposition: A | Discharge status: Home / Self Care | Payer: Medicare Other | Source: Ambulatory Visit | Attending: Neurology | Admitting: Neurology

## 2019-08-31 ENCOUNTER — Other Ambulatory Visit: Payer: Self-pay

## 2019-08-31 DIAGNOSIS — R569 Unspecified convulsions: Secondary | ICD-10-CM | POA: Diagnosis not present

## 2019-08-31 NOTE — Procedures (Signed)
  [place] A. Merlene Laughter, MD     [email or web address]           HISTORY: The patient is a 49 year old female who presents with episodes that are worrisome for seizure  MEDICATIONS: Scheduled Meds: Continuous Infusions: . lactated ringers    . lactated ringers 1,000 mL (12/11/15 1232)   PRN Meds:.     ANALYSIS: A 16 channel recording using standard 10 20 measurements is conducted for 28 minutes.  This is a well-formed posterior dominant rhythm of 11 hertz which attenuates with eye-opening. There is beta activity observed in frontal areas. The recording transitions to theta slowing indicating drowsiness. There is stage II non-REM sleep also observed with K complexes and sleep spindles although this is brief. Photic stimulation is carried out without abnormal changes in the background activity. There is no focal or lateralized slowing. There is no epileptiform activity is noted.   IMPRESSION: 1. This is a normal recording of awake and sleep states.      Elvie Palomo A. Merlene Laughter, M.D.  Diplomate, Tax adviser of Psychiatry and Neurology ( Neurology).

## 2019-08-31 NOTE — Progress Notes (Signed)
EEG complete - results pending 

## 2019-09-13 DIAGNOSIS — M419 Scoliosis, unspecified: Secondary | ICD-10-CM | POA: Diagnosis not present

## 2019-09-13 DIAGNOSIS — G43909 Migraine, unspecified, not intractable, without status migrainosus: Secondary | ICD-10-CM | POA: Diagnosis not present

## 2019-09-13 DIAGNOSIS — G894 Chronic pain syndrome: Secondary | ICD-10-CM | POA: Diagnosis not present

## 2019-09-13 DIAGNOSIS — M7600 Gluteal tendinitis, unspecified hip: Secondary | ICD-10-CM | POA: Diagnosis not present

## 2019-09-22 DIAGNOSIS — Z79899 Other long term (current) drug therapy: Secondary | ICD-10-CM | POA: Diagnosis not present

## 2019-09-22 DIAGNOSIS — Z79891 Long term (current) use of opiate analgesic: Secondary | ICD-10-CM | POA: Diagnosis not present

## 2019-09-22 DIAGNOSIS — Q8501 Neurofibromatosis, type 1: Secondary | ICD-10-CM | POA: Diagnosis not present

## 2019-09-22 DIAGNOSIS — G40309 Generalized idiopathic epilepsy and epileptic syndromes, not intractable, without status epilepticus: Secondary | ICD-10-CM | POA: Diagnosis not present

## 2019-09-22 DIAGNOSIS — G40209 Localization-related (focal) (partial) symptomatic epilepsy and epileptic syndromes with complex partial seizures, not intractable, without status epilepticus: Secondary | ICD-10-CM | POA: Diagnosis not present

## 2019-09-22 DIAGNOSIS — G43901 Migraine, unspecified, not intractable, with status migrainosus: Secondary | ICD-10-CM | POA: Diagnosis not present

## 2019-09-22 DIAGNOSIS — G894 Chronic pain syndrome: Secondary | ICD-10-CM | POA: Diagnosis not present

## 2019-09-23 DIAGNOSIS — Z79891 Long term (current) use of opiate analgesic: Secondary | ICD-10-CM | POA: Diagnosis not present

## 2019-09-30 DIAGNOSIS — Z23 Encounter for immunization: Secondary | ICD-10-CM | POA: Diagnosis not present

## 2019-10-11 DIAGNOSIS — M259 Joint disorder, unspecified: Secondary | ICD-10-CM | POA: Diagnosis not present

## 2019-10-11 DIAGNOSIS — M419 Scoliosis, unspecified: Secondary | ICD-10-CM | POA: Diagnosis not present

## 2019-10-11 DIAGNOSIS — M5030 Other cervical disc degeneration, unspecified cervical region: Secondary | ICD-10-CM | POA: Diagnosis not present

## 2019-10-11 DIAGNOSIS — G894 Chronic pain syndrome: Secondary | ICD-10-CM | POA: Diagnosis not present

## 2019-10-14 DIAGNOSIS — G609 Hereditary and idiopathic neuropathy, unspecified: Secondary | ICD-10-CM | POA: Diagnosis not present

## 2019-10-20 DIAGNOSIS — Z79899 Other long term (current) drug therapy: Secondary | ICD-10-CM | POA: Diagnosis not present

## 2019-10-20 DIAGNOSIS — G43009 Migraine without aura, not intractable, without status migrainosus: Secondary | ICD-10-CM | POA: Diagnosis not present

## 2019-10-20 DIAGNOSIS — G609 Hereditary and idiopathic neuropathy, unspecified: Secondary | ICD-10-CM | POA: Diagnosis not present

## 2019-10-20 DIAGNOSIS — G43901 Migraine, unspecified, not intractable, with status migrainosus: Secondary | ICD-10-CM | POA: Diagnosis not present

## 2019-10-20 DIAGNOSIS — G894 Chronic pain syndrome: Secondary | ICD-10-CM | POA: Diagnosis not present

## 2019-10-20 DIAGNOSIS — M797 Fibromyalgia: Secondary | ICD-10-CM | POA: Diagnosis not present

## 2019-10-20 DIAGNOSIS — G4700 Insomnia, unspecified: Secondary | ICD-10-CM | POA: Diagnosis not present

## 2019-10-20 DIAGNOSIS — G40309 Generalized idiopathic epilepsy and epileptic syndromes, not intractable, without status epilepticus: Secondary | ICD-10-CM | POA: Diagnosis not present

## 2019-10-26 DIAGNOSIS — M1991 Primary osteoarthritis, unspecified site: Secondary | ICD-10-CM | POA: Diagnosis not present

## 2019-10-26 DIAGNOSIS — J329 Chronic sinusitis, unspecified: Secondary | ICD-10-CM | POA: Diagnosis not present

## 2019-10-26 DIAGNOSIS — G40309 Generalized idiopathic epilepsy and epileptic syndromes, not intractable, without status epilepticus: Secondary | ICD-10-CM | POA: Diagnosis not present

## 2019-10-26 DIAGNOSIS — Z1211 Encounter for screening for malignant neoplasm of colon: Secondary | ICD-10-CM | POA: Diagnosis not present

## 2019-10-26 DIAGNOSIS — Z681 Body mass index (BMI) 19 or less, adult: Secondary | ICD-10-CM | POA: Diagnosis not present

## 2019-10-26 DIAGNOSIS — G43909 Migraine, unspecified, not intractable, without status migrainosus: Secondary | ICD-10-CM | POA: Diagnosis not present

## 2019-10-28 DIAGNOSIS — Z23 Encounter for immunization: Secondary | ICD-10-CM | POA: Diagnosis not present

## 2019-10-30 DIAGNOSIS — Z1211 Encounter for screening for malignant neoplasm of colon: Secondary | ICD-10-CM | POA: Diagnosis not present

## 2019-11-08 DIAGNOSIS — M5030 Other cervical disc degeneration, unspecified cervical region: Secondary | ICD-10-CM | POA: Diagnosis not present

## 2019-11-08 DIAGNOSIS — G894 Chronic pain syndrome: Secondary | ICD-10-CM | POA: Diagnosis not present

## 2019-11-08 DIAGNOSIS — M5136 Other intervertebral disc degeneration, lumbar region: Secondary | ICD-10-CM | POA: Diagnosis not present

## 2019-11-08 DIAGNOSIS — M259 Joint disorder, unspecified: Secondary | ICD-10-CM | POA: Diagnosis not present

## 2019-11-19 ENCOUNTER — Ambulatory Visit: Payer: Medicare Other

## 2019-11-19 ENCOUNTER — Other Ambulatory Visit: Payer: Self-pay

## 2019-11-19 ENCOUNTER — Ambulatory Visit (INDEPENDENT_AMBULATORY_CARE_PROVIDER_SITE_OTHER): Payer: Medicare Other | Admitting: Orthopedic Surgery

## 2019-11-19 ENCOUNTER — Encounter: Payer: Self-pay | Admitting: Orthopedic Surgery

## 2019-11-19 VITALS — Ht 60.0 in | Wt 95.4 lb

## 2019-11-19 DIAGNOSIS — M25562 Pain in left knee: Secondary | ICD-10-CM

## 2019-11-19 DIAGNOSIS — G8929 Other chronic pain: Secondary | ICD-10-CM

## 2019-11-19 DIAGNOSIS — M25561 Pain in right knee: Secondary | ICD-10-CM | POA: Diagnosis not present

## 2019-11-19 NOTE — Patient Instructions (Addendum)
Take the antiinflammatory medicine for 6 weeks, go for physical therapy, you will get a call with the appointments  Avoid stairs when possible / avoid keeping you knees bent for extended amounts of time, take breaks to walk around and take breaks to extend and bend the knee  Use Aspercreme, Biofreeze or Voltaren gel over the counter 2-3 times daily make sure you rub it in well each time you use it.

## 2019-11-19 NOTE — Progress Notes (Signed)
Chief Complaint  Patient presents with  . Knee Pain    Bilat/ R is the worst right now/they have been hurting for a few months   49 year old female with chronic bilateral knee pain greater than 6 weeks complains of right greater than left dull aching pain when bending sitting or going up and down the steps.  She has tried meloxicam for 4 weeks with no improvement patient referred to [place] by Dr. Riley Kill  Patient does take 10 mg of hydrocodone 3 times a day for chronic back pain related to scoliosis and degenerative disc disease  Denies chest pain or shortness of breath does complain of chronic back pain neck pain seizures tingling headache dizziness  Review of Systems  All other systems reviewed and are negative.  Past Medical History:  Diagnosis Date  . Bruising, spontaneous 12/08/2013  . Chronic back pain   . Chronic back pain   . DDD (degenerative disc disease), lumbar   . DDD (degenerative disc disease), lumbar   . Fibromyalgia   . Headache(784.0)   . Ovarian cyst   . Partial tear subscapularis tendon 12/27/2011  . Scoliosis   . Seizures ([place])    started 9/12-    Past Surgical History:  Procedure Laterality Date  . ABDOMINAL HYSTERECTOMY  2012  . BACK SURGERY  1989   scoliosis throsic-rods  . CESAREAN SECTION    . CHOLECYSTECTOMY N/A 11/17/2012   Procedure: LAPAROSCOPIC CHOLECYSTECTOMY WITH INTRAOPERATIVE CHOLANGIOGRAM;  Surgeon: Ralene Ok, MD;  Location: [facility];  Service: General;  Laterality: N/A;  . ENDOSCOPIC RETROGRADE CHOLANGIOPANCREATOGRAPHY (ERCP) WITH PROPOFOL N/A 06/11/2019   Procedure: ENDOSCOPIC RETROGRADE CHOLANGIOPANCREATOGRAPHY (ERCP) WITH PROPOFOL;  Surgeon: Carol Ada, MD;  [facility];  Service: Endoscopy;  Laterality: N/A;  . ERCP N/A 05/07/2013   Procedure: ENDOSCOPIC RETROGRADE CHOLANGIOPANCREATOGRAPHY (ERCP);  Surgeon: Beryle Beams, MD;  Location: [facility];  Service: Endoscopy;  Laterality: N/A;  start ercp first, if canulation fails  switch to EUS   . ESOPHAGOGASTRODUODENOSCOPY (EGD) WITH PROPOFOL N/A 05/15/2019   Procedure: ESOPHAGOGASTRODUODENOSCOPY (EGD) WITH PROPOFOL;  Surgeon: Danie Binder, MD;  [facility];  Service: Endoscopy;  Laterality: N/A;  . EUS N/A 11/03/2012   Procedure: UPPER ENDOSCOPIC ULTRASOUND (EUS) LINEAR;  Surgeon: Beryle Beams, MD;  Location: [facility];  Service: Endoscopy;  Laterality: N/A;  . left arm    . NECK SURGERY    . REMOVAL OF STONES  06/11/2019   Procedure: REMOVAL OF STONES;  Surgeon: Carol Ada, MD;  Location: [facility];  Service: Endoscopy;;  . TUBAL LIGATION    . UPPER ESOPHAGEAL ENDOSCOPIC ULTRASOUND (EUS) N/A 06/11/2019   Procedure: UPPER ESOPHAGEAL ENDOSCOPIC ULTRASOUND (EUS);  Surgeon: Carol Ada, MD;  Location: [facility];  Service: Endoscopy;  Laterality: N/A;     Ht 5' (1.524 m)   Wt 95 lb 6 oz (43.3 kg)   BMI 18.63 kg/m   General appearance is normal the patient is thin and ectomorphic in body habitus, she is oriented x3 mood is pleasant affect is flat gait and station are normal  Right knee skin has multiple areas all over the skin seem to be chronic nothing open strength is normal stability normal motion is normal although she says she cannot straighten her knees passively I could straighten them she had hypermobile patella tenderness medial lateral facets apprehension normal pulse and temperature normal sensation distally  Coordination and balance normal  Left knee similar findings to the right no effusion tender medial lateral facets  hypermobile patella full range of motion ligaments stable strength and muscle tone normal skin same areas all over the leg and knee and lower leg seem to be chronic nothing open neurovascular exam intact   Images show no arthritis  Diagnoses anterior knee pain syndrome  I spent some time telling the patient about her condition it is chronic it does not need surgery she needs therapy activity modification  we went over those things as well  Recommend physical therapy take the meloxicam for up to 6 weeks before saying it does not work  No follow-up needed no surgery needed

## 2019-12-07 ENCOUNTER — Encounter (HOSPITAL_COMMUNITY): Payer: Self-pay | Admitting: Physical Therapy

## 2019-12-07 ENCOUNTER — Ambulatory Visit (HOSPITAL_COMMUNITY): Payer: Medicare Other | Attending: Orthopedic Surgery | Admitting: Physical Therapy

## 2019-12-07 ENCOUNTER — Other Ambulatory Visit: Payer: Self-pay

## 2019-12-07 DIAGNOSIS — M25661 Stiffness of right knee, not elsewhere classified: Secondary | ICD-10-CM | POA: Insufficient documentation

## 2019-12-07 DIAGNOSIS — M25561 Pain in right knee: Secondary | ICD-10-CM

## 2019-12-07 DIAGNOSIS — R29898 Other symptoms and signs involving the musculoskeletal system: Secondary | ICD-10-CM | POA: Diagnosis present

## 2019-12-07 DIAGNOSIS — R2689 Other abnormalities of gait and mobility: Secondary | ICD-10-CM | POA: Insufficient documentation

## 2019-12-07 DIAGNOSIS — M25662 Stiffness of left knee, not elsewhere classified: Secondary | ICD-10-CM | POA: Insufficient documentation

## 2019-12-07 DIAGNOSIS — M25562 Pain in left knee: Secondary | ICD-10-CM | POA: Diagnosis present

## 2019-12-07 DIAGNOSIS — M6281 Muscle weakness (generalized): Secondary | ICD-10-CM | POA: Diagnosis present

## 2019-12-07 NOTE — Therapy (Signed)
[place], [place], [address] Phone: [phone]   Fax:  [phone]  Physical Therapy Evaluation  Patient Details  Name: Sharon Welch MRN: [id number] Date of Birth: [birth year] Referring Provider (PT): Arther Abbott MD   Encounter Date: 12/07/2019   PT End of Session - 12/07/19 1506    Visit Number 1    Number of Visits 12    Date for PT Re-Evaluation 01/18/20    Authorization Type Primary: medicare Secondary :medicaid of [place]    Progress Note Due on Visit 10    PT Start Time 6734    PT Stop Time 1500    PT Time Calculation (min) 45 min    Activity Tolerance Patient tolerated treatment well;Patient limited by pain    Behavior During Therapy [facility] for tasks assessed/performed           Past Medical History:  Diagnosis Date  . Bruising, spontaneous 12/08/2013  . Chronic back pain   . Chronic back pain   . DDD (degenerative disc disease), lumbar   . DDD (degenerative disc disease), lumbar   . Fibromyalgia   . Headache(784.0)   . Ovarian cyst   . Partial tear subscapularis tendon 12/27/2011  . Scoliosis   . Seizures ([place])    started 9/12-    Past Surgical History:  Procedure Laterality Date  . ABDOMINAL HYSTERECTOMY  2012  . BACK SURGERY  1989   scoliosis throsic-rods  . CESAREAN SECTION    . CHOLECYSTECTOMY N/A 11/17/2012   Procedure: LAPAROSCOPIC CHOLECYSTECTOMY WITH INTRAOPERATIVE CHOLANGIOGRAM;  Surgeon: Ralene Ok, MD;  Location: [facility];  Service: General;  Laterality: N/A;  . ENDOSCOPIC RETROGRADE CHOLANGIOPANCREATOGRAPHY (ERCP) WITH PROPOFOL N/A 06/11/2019   Procedure: ENDOSCOPIC RETROGRADE CHOLANGIOPANCREATOGRAPHY (ERCP) WITH PROPOFOL;  Surgeon: Carol Ada, MD;  [facility];  Service: Endoscopy;  Laterality: N/A;  . ERCP N/A 05/07/2013   Procedure: ENDOSCOPIC RETROGRADE CHOLANGIOPANCREATOGRAPHY (ERCP);  Surgeon: Beryle Beams, MD;  Location: [facility];  Service: Endoscopy;   Laterality: N/A;  start ercp first, if canulation fails switch to EUS   . ESOPHAGOGASTRODUODENOSCOPY (EGD) WITH PROPOFOL N/A 05/15/2019   Procedure: ESOPHAGOGASTRODUODENOSCOPY (EGD) WITH PROPOFOL;  Surgeon: Danie Binder, MD;  [facility];  Service: Endoscopy;  Laterality: N/A;  . EUS N/A 11/03/2012   Procedure: UPPER ENDOSCOPIC ULTRASOUND (EUS) LINEAR;  Surgeon: Beryle Beams, MD;  Location: [facility];  Service: Endoscopy;  Laterality: N/A;  . left arm    . NECK SURGERY    . REMOVAL OF STONES  06/11/2019   Procedure: REMOVAL OF STONES;  Surgeon: Carol Ada, MD;  [facility];  Service: Endoscopy;;  . TUBAL LIGATION    . UPPER ESOPHAGEAL ENDOSCOPIC ULTRASOUND (EUS) N/A 06/11/2019   Procedure: UPPER ESOPHAGEAL ENDOSCOPIC ULTRASOUND (EUS);  Surgeon: Carol Ada, MD;  Location: [facility];  Service: Endoscopy;  Laterality: N/A;    There were no vitals filed for this visit.    Subjective Assessment - 12/07/19 1416    Subjective Patient is a 49 y.o female who presents to physical therapy with c/o chronic right and left knee pain going back about 20 years but things have been pretty constant for about a year. She states her right knee is worse than her left but they are both painful. Straightening her knee is painful, transfers to standing and bending hurts as well. Her knees ache and she is having some shooting pain into her feet. She has history of scoliosis  and chronic low back pain. She has not found much that makes her knee better. She tries to walk and remain active in pool at her house. She is trying to not give into the pain. Patient's main goal is to work her leg so it's not painful and keep her knee from locking up, and avoid surgery at all costs.    Limitations House hold activities;Standing;Walking;Other (comment)   transfers, bending   How long can you walk comfortably? 30 minutes    Patient Stated Goals to work her leg so it's not painful and keep her  knee from locking up, and avoid surgery at all costs    Currently in Pain? Yes    Pain Score 4     Pain Location Knee   bilateral knees   Pain Descriptors / Indicators Dull;Sharp    Pain Type Chronic pain              [facility] PT Assessment - 12/07/19 0001      Assessment   Medical Diagnosis Chronic bilateral knee pain    Referring Provider (PT) Arther Abbott MD    Onset Date/Surgical Date 12/07/18    Next MD Visit None scheduled       Precautions   Precautions None      Restrictions   Weight Bearing Restrictions No      Balance Screen   Has the patient fallen in the past 6 months Yes    How many times? 1-2    Has the patient had a decrease in activity level because of a fear of falling?  No    Is the patient reluctant to leave their home because of a fear of falling?  No      Prior Function   Level of Independence Independent    Vocation On disability    Vocation Requirements former caregiver      Cognition   Overall Cognitive Status Within Functional Limits for tasks assessed      Observation/Other Assessments   Skin Integrity Patient ambulates without AD    Focus on Therapeutic Outcomes (FOTO)  35% limited      ROM / Strength   AROM / PROM / Strength AROM;Strength      AROM   Overall AROM Comments severe pain with all movement    AROM Assessment Site Knee    Right/Left Knee Right;Left    Right Knee Extension 48   lacking   Right Knee Flexion 102    Left Knee Extension 33   lacking   Left Knee Flexion 127      Strength   Overall Strength Comments severe pain with all testing    Strength Assessment Site Hip;Knee;Ankle    Right/Left Hip Right;Left    Right Hip Flexion 4-/5    Right Hip Extension 3-/5    Right Hip ABduction 3-/5    Left Hip Flexion 4-/5    Left Hip Extension 3-/5    Left Hip ABduction 3-/5    Right/Left Knee Right;Left    Right Knee Flexion 4-/5    Right Knee Extension 4-/5    Left Knee Flexion 4-/5    Left Knee Extension 4-/5     Right/Left Ankle Right;Left    Right Ankle Dorsiflexion 4/5    Left Ankle Dorsiflexion 4/5      Palpation   Palpation comment TTP bilateral quads, hamstrings, gastroc/soleus; does not tolerate pressure in BLE, hypersensitive      Transfers   Comments slow, labored, use  of hands      Ambulation/Gait   Ambulation/Gait Yes    Ambulation/Gait Assistance 6: Modified independent (Device/Increase time)    Ambulation Distance (Feet) 275 Feet    Assistive device None    Gait Pattern Decreased hip/knee flexion - right;Decreased hip/knee flexion - left;Right flexed knee in stance;Left flexed knee in stance;Antalgic    Ambulation Surface Level;Indoor    Gait Comments 2MWT, knee extension ROM improves with weightbearing compared to supine, limited knee flexion/ext throughout gait cycle                      Objective measurements completed on examination: See above findings.       [facility] Adult PT Treatment/Exercise - 12/07/19 0001      Exercises   Exercises Knee/Hip      Knee/Hip Exercises: Seated   Long Arc Quad AROM;Both;1 set;10 reps    Long [employer] Limitations 5 second holds      Knee/Hip Exercises: Supine   Quad Sets 10 reps    Quad Sets Limitations 5-10 second holds                  PT Education - 12/07/19 1425    Education Details Patient educated on POC, exam findings, scope of PT, intial HEP, knee arthritis    Person(s) Educated Patient    Methods Explanation;Demonstration;Handout    Comprehension Verbalized understanding;Returned demonstration            PT Short Term Goals - 12/07/19 1514      PT SHORT TERM GOAL #1   Title Patient will be independent with HEP in order to improve functional outcomes.    Time 3    Period Weeks    Status New    Target Date 12/28/19      PT SHORT TERM GOAL #2   Title Patient will report at least 25% improvement in symptoms for improved quality of life.    Time 6    Period Weeks    Status New    Target Date  12/28/19             PT Long Term Goals - 12/07/19 1518      PT LONG TERM GOAL #1   Title Patient will report at least 75% improvement in symptoms for improved quality of life.    Time 6    Period Weeks    Status New    Target Date 01/18/20      PT LONG TERM GOAL #2   Title Patient will improve FOTO score by at least 5 points in order to indicate improved tolerance to activity.    Time 6    Period Weeks    Status New    Target Date 01/18/20      PT LONG TERM GOAL #3   Title Patient will be able to ambulate at least 325 feet in 2MWT in order to demonstrate improved gait speed for community ambulation.    Time 6    Period Weeks    Status New    Target Date 01/18/20      PT LONG TERM GOAL #4   Title Patient will be able to perform sit to stand without UE use in order to demonstrate improved functional strength.    Time 6    Period Weeks    Status New    Target Date 01/18/20  Plan - 12/07/19 1510    Clinical Impression Statement Patient is a 49 y.o female who presents to physical therapy with c/o chronic right and left knee pain. She presents with pain limited deficits in bilateral knee and LE strength, ROM, endurance, activity tolerance, gait, hypersensitivity, and functional mobility with ADL. She is having to modify and restrict ADL as indicated by FOTO score as well as subjective information and objective measures which is affecting overall participation. Patient will benefit from skilled physical therapy in order to improve function and reduce impairment.    Personal Factors and Comorbidities Comorbidity 3+;Past/Current Experience;Time since onset of injury/illness/exacerbation;Fitness;Age    Comorbidities Chronic LBP, scolosis, neurological disease, chronic knee pain    Examination-Activity Limitations Bed Mobility;Bend;Locomotion Level;Transfers;Stairs;Stand;Toileting;Squat;Sleep;Dressing;Carry    Examination-Participation Restrictions  Cleaning;Community Activity;[employer] Work;Meal Prep;Shop;Volunteer;Laundry    Stability/Clinical Decision Making Stable/Uncomplicated    Clinical Decision Making Low    Rehab Potential Fair    PT Frequency 2x / week    PT Duration 6 weeks    PT Treatment/Interventions ADLs/Self Care Home Management;Aquatic Therapy;Cryotherapy;Electrical Stimulation;Biofeedback;Moist Heat;Traction;Ultrasound;Parrafin;Fluidtherapy;Contrast Bath;DME Instruction;Gait training;Functional mobility training;Stair training;Therapeutic activities;Therapeutic exercise;Balance training;Neuromuscular re-education;Patient/family education;Orthotic Fit/Training;Manual techniques;Manual lymph drainage;Compression bandaging;Scar mobilization;Passive range of motion;Dry needling;Energy conservation;Splinting;Taping;Vasopneumatic Device;Vestibular;Spinal Manipulations;Joint Manipulations    PT Next Visit Plan assess response to HEP, continue and progress LE strengthing and bilateral knee ROM/mobility as tolerated, possibly perform manual techniques for pain relief/mobility    PT Home Exercise Plan 6/15 LAQ, quad sets    Consulted and Agree with Plan of Care Patient           Patient will benefit from skilled therapeutic intervention in order to improve the following deficits and impairments:  Abnormal gait, Decreased range of motion, Difficulty walking, Decreased endurance, Increased muscle spasms, Decreased activity tolerance, Decreased knowledge of use of DME, Decreased balance, Pain, Decreased mobility, Decreased strength, Improper body mechanics, Impaired flexibility, Hypomobility  Visit Diagnosis: Right knee pain, unspecified chronicity  Left knee pain, unspecified chronicity  Muscle weakness (generalized)  Other abnormalities of gait and mobility  Other symptoms and signs involving the musculoskeletal system  Stiffness of right knee, not elsewhere classified  Stiffness of left knee, not elsewhere  classified     Problem List Patient Active Problem List   Diagnosis Date Noted  . Acute GI bleed due to bleeding pyloric/gastric ulcer 05/15/2019  . PUD (peptic ulcer disease)-EGD 05/15/2019 with bleeding pyloric/gastric ulcer, nonbleeding duodenal ulcers and gastritis   . Hematemesis with nausea   . Migraine headache 09/25/2015  . 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 Bilateral occipital neuralgia 01/02/2015  . Migraine 01/02/2015  . 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 DDD (degenerative disc disease), cervical 10/27/2014  . 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 DDD (degenerative disc disease), lumbosacral 10/27/2014  . Partial tear subscapularis tendon 12/27/2011    3:22 PM, 12/07/19 Mearl Latin PT, DPT Physical Therapist at [place] [place], [place], [address] Phone: [phone]   Fax:  [phone]  Name: Danahi Reddish Christner MRN: [id number] Date of Birth: Nov 09, 1970

## 2019-12-07 NOTE — Patient Instructions (Signed)
Access Code: [email or web address] URL: [email or web address] Date: 12/07/2019 Prepared by: Mitzi Hansen Nole Robey  Exercises Seated Long Arc Quad - 3 x daily - 7 x weekly - 1 sets - 10 reps - 5-10 second hold Supine Quad Set - 3 x daily - 7 x weekly - 1 sets - 10 reps - 5-10 second hold

## 2019-12-09 ENCOUNTER — Other Ambulatory Visit: Payer: Self-pay

## 2019-12-09 ENCOUNTER — Ambulatory Visit (HOSPITAL_COMMUNITY): Payer: Medicare Other | Admitting: Physical Therapy

## 2019-12-09 ENCOUNTER — Encounter (HOSPITAL_COMMUNITY): Payer: Self-pay | Admitting: Physical Therapy

## 2019-12-09 DIAGNOSIS — M25662 Stiffness of left knee, not elsewhere classified: Secondary | ICD-10-CM

## 2019-12-09 DIAGNOSIS — M25562 Pain in left knee: Secondary | ICD-10-CM

## 2019-12-09 DIAGNOSIS — R29898 Other symptoms and signs involving the musculoskeletal system: Secondary | ICD-10-CM

## 2019-12-09 DIAGNOSIS — M25561 Pain in right knee: Secondary | ICD-10-CM | POA: Diagnosis not present

## 2019-12-09 DIAGNOSIS — M6281 Muscle weakness (generalized): Secondary | ICD-10-CM

## 2019-12-09 DIAGNOSIS — M25661 Stiffness of right knee, not elsewhere classified: Secondary | ICD-10-CM

## 2019-12-09 DIAGNOSIS — R2689 Other abnormalities of gait and mobility: Secondary | ICD-10-CM

## 2019-12-09 NOTE — Therapy (Signed)
[place], [place], [address] Phone: [phone]   Fax:  [phone]  Physical Therapy Treatment  Patient Details  Name: Sharon Welch [place] MRN: [id number] Date of Birth: September 20, 1970 Referring Provider (PT): Arther Abbott MD   Encounter Date: 12/09/2019   PT End of Session - 12/09/19 1330    Visit Number 2    Number of Visits 12    Date for PT Re-Evaluation 01/18/20    Authorization Type Primary: medicare Secondary :medicaid of [place]    Progress Note Due on Visit 10    PT Start Time 1315    PT Stop Time 1355    PT Time Calculation (min) 40 min    Activity Tolerance Patient tolerated treatment well;Patient limited by pain    Behavior During Therapy [facility] for tasks assessed/performed           Past Medical History:  Diagnosis Date  . Bruising, spontaneous 12/08/2013  . Chronic back pain   . Chronic back pain   . DDD (degenerative disc disease), lumbar   . DDD (degenerative disc disease), lumbar   . Fibromyalgia   . Headache(784.0)   . Ovarian cyst   . Partial tear subscapularis tendon 12/27/2011  . Scoliosis   . Seizures ([place])    started 9/12-    Past Surgical History:  Procedure Laterality Date  . ABDOMINAL HYSTERECTOMY  2012  . BACK SURGERY  1989   scoliosis throsic-rods  . CESAREAN SECTION    . CHOLECYSTECTOMY N/A 11/17/2012   Procedure: LAPAROSCOPIC CHOLECYSTECTOMY WITH INTRAOPERATIVE CHOLANGIOGRAM;  Surgeon: Ralene Ok, MD;  Location: [facility];  Service: General;  Laterality: N/A;  . ENDOSCOPIC RETROGRADE CHOLANGIOPANCREATOGRAPHY (ERCP) WITH PROPOFOL N/A 06/11/2019   Procedure: ENDOSCOPIC RETROGRADE CHOLANGIOPANCREATOGRAPHY (ERCP) WITH PROPOFOL;  Surgeon: Carol Ada, MD;  [facility];  Service: Endoscopy;  Laterality: N/A;  . ERCP N/A 05/07/2013   Procedure: ENDOSCOPIC RETROGRADE CHOLANGIOPANCREATOGRAPHY (ERCP);  Surgeon: Beryle Beams, MD;  Location: [facility];  Service: Endoscopy;   Laterality: N/A;  start ercp first, if canulation fails switch to EUS   . ESOPHAGOGASTRODUODENOSCOPY (EGD) WITH PROPOFOL N/A 05/15/2019   Procedure: ESOPHAGOGASTRODUODENOSCOPY (EGD) WITH PROPOFOL;  Surgeon: Danie Binder, MD;  [facility];  Service: Endoscopy;  Laterality: N/A;  . EUS N/A 11/03/2012   Procedure: UPPER ENDOSCOPIC ULTRASOUND (EUS) LINEAR;  Surgeon: Beryle Beams, MD;  Location: [facility];  Service: Endoscopy;  Laterality: N/A;  . left arm    . NECK SURGERY    . REMOVAL OF STONES  06/11/2019   Procedure: REMOVAL OF STONES;  Surgeon: Carol Ada, MD;  Location: [facility];  Service: Endoscopy;;  . TUBAL LIGATION    . UPPER ESOPHAGEAL ENDOSCOPIC ULTRASOUND (EUS) N/A 06/11/2019   Procedure: UPPER ESOPHAGEAL ENDOSCOPIC ULTRASOUND (EUS);  Surgeon: Carol Ada, MD;  Location: [facility];  Service: Endoscopy;  Laterality: N/A;    There were no vitals filed for this visit.   Subjective Assessment - 12/09/19 1317    Subjective PT states she is doing her exercises at home; she is a little sore.    Limitations House hold activities;Standing;Walking;Other (comment)   transfers, bending   How long can you walk comfortably? 30 minutes    Patient Stated Goals to work her leg so it's not painful and keep her knee from locking up, and avoid surgery at all costs    Currently in Pain? Yes    Pain Score 5     Pain  Location Knee    Pain Orientation Right;Left    Pain Descriptors / Indicators Sharp;Aching    Pain Type Chronic pain    Pain Onset More than a month ago    Pain Frequency Constant    Aggravating Factors  anything    Pain Relieving Factors nothing    Effect of Pain on Daily Activities limits                             [facility] Adult PT Treatment/Exercise - 12/09/19 0001      Exercises   Exercises Knee/Hip      Knee/Hip Exercises: Stretches   Active Hamstring Stretch Both;3 reps;30 seconds    Knee: Self-Stretch to increase Flexion  Both;3 reps    Gastroc Stretch --   slant board 3 xx 30      Knee/Hip Exercises: Aerobic   Nustep hills 3 , level 2 x 5'      Knee/Hip Exercises: Standing   Heel Raises Both;10 reps    Knee Flexion Both;10 reps    Forward Step Up Both;5 reps;Step Height: 4"    Rocker Board 2 minutes    SLS B x 2       Knee/Hip Exercises: Seated   Long Arc Quad AROM;Both;1 set;10 reps      Knee/Hip Exercises: Supine   Quad Sets 10 reps                  PT Education - 12/09/19 1329    Education Details HEP    Person(s) Educated Patient    Methods Explanation    Comprehension Verbalized understanding            PT Short Term Goals - 12/09/19 1334      PT SHORT TERM GOAL #1   Title Patient will be independent with HEP in order to improve functional outcomes.    Time 3    Period Weeks    Status On-going    Target Date 12/28/19      PT SHORT TERM GOAL #2   Title Patient will report at least 25% improvement in symptoms for improved quality of life.    Time 6    Period Weeks    Status On-going    Target Date 12/28/19             PT Long Term Goals - 12/09/19 1334      PT LONG TERM GOAL #1   Title Patient will report at least 75% improvement in symptoms for improved quality of life.    Time 6    Period Weeks    Status On-going      PT LONG TERM GOAL #2   Title Patient will improve FOTO score by at least 5 points in order to indicate improved tolerance to activity.    Time 6    Period Weeks    Status On-going      PT LONG TERM GOAL #3   Title Patient will be able to ambulate at least 325 feet in 2MWT in order to demonstrate improved gait speed for community ambulation.    Time 6    Period Weeks    Status On-going      PT LONG TERM GOAL #4   Title Patient will be able to perform sit to stand without UE use in order to demonstrate improved functional strength.    Time 6    Period Weeks  Status On-going                 Plan - 12/09/19 1330     Clinical Impression Statement Reviewed evaluation and goals with pt.  Reviewed HEP and began closed chain exercises.  Pt had pain with almost all exercises but able to complete with good form.    Personal Factors and Comorbidities Comorbidity 3+;Past/Current Experience;Time since onset of injury/illness/exacerbation;Fitness;Age    Comorbidities Chronic LBP, scolosis, neurological disease, chronic knee pain    Examination-Activity Limitations Bed Mobility;Bend;Locomotion Level;Transfers;Stairs;Stand;Toileting;Squat;Sleep;Dressing;Carry    Examination-Participation Restrictions Cleaning;Community Activity;[employer] Work;Meal Prep;Shop;Volunteer;Laundry    Stability/Clinical Decision Making Stable/Uncomplicated    Clinical Decision Making Low    Rehab Potential Fair    PT Frequency 2x / week    PT Duration 6 weeks    PT Treatment/Interventions ADLs/Self Care Home Management;Aquatic Therapy;Cryotherapy;Electrical Stimulation;Biofeedback;Moist Heat;Traction;Ultrasound;Parrafin;Fluidtherapy;Contrast Bath;DME Instruction;Gait training;Functional mobility training;Stair training;Therapeutic activities;Therapeutic exercise;Balance training;Neuromuscular re-education;Patient/family education;Orthotic Fit/Training;Manual techniques;Manual lymph drainage;Compression bandaging;Scar mobilization;Passive range of motion;Dry needling;Energy conservation;Splinting;Taping;Vasopneumatic Device;Vestibular;Spinal Manipulations;Joint Manipulations    PT Next Visit Plan continue and progress LE strengthing and bilateral knee ROM/mobility as tolerated, possibly perform manual techniques for pain relief/mobility          Added: heelraise, squat and hamstring stretch to HEP Patient will benefit from skilled therapeutic intervention in order to improve the following deficits and impairments:  Abnormal gait, Decreased range of motion, Difficulty walking, Decreased endurance, Increased muscle spasms, Decreased activity tolerance,  Decreased knowledge of use of DME, Decreased balance, Pain, Decreased mobility, Decreased strength, Improper body mechanics, Impaired flexibility, Hypomobility  Visit Diagnosis: Right knee pain, unspecified chronicity  Left knee pain, unspecified chronicity  Muscle weakness (generalized)  Other abnormalities of gait and mobility  Other symptoms and signs involving the musculoskeletal system  Stiffness of right knee, not elsewhere classified  Stiffness of left knee, not elsewhere classified     Problem List Patient Active Problem List   Diagnosis Date Noted  . Acute GI bleed due to bleeding pyloric/gastric ulcer 05/15/2019  . PUD (peptic ulcer disease)-EGD 05/15/2019 with bleeding pyloric/gastric ulcer, nonbleeding duodenal ulcers and gastritis   . Hematemesis with nausea   . Migraine headache 09/25/2015  . 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 Bilateral occipital neuralgia 01/02/2015  . Migraine 01/02/2015  . 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 DDD (degenerative disc disease), cervical 10/27/2014  . 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 DDD (degenerative disc disease), lumbosacral 10/27/2014  . Partial tear subscapularis tendon 12/27/2011    Rayetta Humphrey, PT CLT [phone] 12/09/2019, 1:57 PM  [place] [address] [place], [place], [address] Phone: [phone]   Fax:  [phone]  Name: Reah Justo Giannetti MRN: [id number] Date of Birth: 1971-01-20

## 2019-12-14 ENCOUNTER — Other Ambulatory Visit: Payer: Self-pay

## 2019-12-14 ENCOUNTER — Encounter (HOSPITAL_COMMUNITY): Payer: Self-pay | Admitting: Physical Therapy

## 2019-12-14 ENCOUNTER — Ambulatory Visit (HOSPITAL_COMMUNITY): Payer: Medicare Other | Admitting: Physical Therapy

## 2019-12-14 DIAGNOSIS — M6281 Muscle weakness (generalized): Secondary | ICD-10-CM

## 2019-12-14 DIAGNOSIS — M25561 Pain in right knee: Secondary | ICD-10-CM

## 2019-12-14 DIAGNOSIS — M25661 Stiffness of right knee, not elsewhere classified: Secondary | ICD-10-CM

## 2019-12-14 DIAGNOSIS — R29898 Other symptoms and signs involving the musculoskeletal system: Secondary | ICD-10-CM

## 2019-12-14 DIAGNOSIS — M25562 Pain in left knee: Secondary | ICD-10-CM

## 2019-12-14 DIAGNOSIS — R2689 Other abnormalities of gait and mobility: Secondary | ICD-10-CM

## 2019-12-14 DIAGNOSIS — M25662 Stiffness of left knee, not elsewhere classified: Secondary | ICD-10-CM

## 2019-12-14 NOTE — Patient Instructions (Signed)
Access Code: [email or web address] URL: [email or web address] Date: 12/14/2019 Prepared by: [facility] Medical illustrator on Wall - 1 x daily - 7 x weekly - 3 reps - 30 seconds hold Standing Knee Flexion - 1 x daily - 7 x weekly - 2 sets - 10 reps Standing Hip Abduction with Counter Support - 1 x daily - 7 x weekly - 2 sets - 10 reps

## 2019-12-14 NOTE — Therapy (Signed)
[place], [place], [address] Phone: [phone]   Fax:  [phone]  Physical Therapy Treatment  Patient Details  Name: Sharon Welch MRN: [id number] Date of Birth: 1971/04/11 Referring Provider (PT): Arther Abbott MD   Encounter Date: 12/14/2019   PT End of Session - 12/14/19 1345    Visit Number 3    Number of Visits 12    Date for PT Re-Evaluation 01/18/20    Authorization Type Primary: medicare Secondary :medicaid of [place]    Progress Note Due on Visit 10    PT Start Time 1346    PT Stop Time 1426    PT Time Calculation (min) 40 min    Activity Tolerance Patient tolerated treatment well;Patient limited by pain    Behavior During Therapy [facility] for tasks assessed/performed           Past Medical History:  Diagnosis Date  . Bruising, spontaneous 12/08/2013  . Chronic back pain   . Chronic back pain   . DDD (degenerative disc disease), lumbar   . DDD (degenerative disc disease), lumbar   . Fibromyalgia   . Headache(784.0)   . Ovarian cyst   . Partial tear subscapularis tendon 12/27/2011  . Scoliosis   . Seizures ([place])    started 9/12-    Past Surgical History:  Procedure Laterality Date  . ABDOMINAL HYSTERECTOMY  2012  . BACK SURGERY  1989   scoliosis throsic-rods  . CESAREAN SECTION    . CHOLECYSTECTOMY N/A 11/17/2012   Procedure: LAPAROSCOPIC CHOLECYSTECTOMY WITH INTRAOPERATIVE CHOLANGIOGRAM;  Surgeon: Ralene Ok, MD;  Location: [facility];  Service: General;  Laterality: N/A;  . ENDOSCOPIC RETROGRADE CHOLANGIOPANCREATOGRAPHY (ERCP) WITH PROPOFOL N/A 06/11/2019   Procedure: ENDOSCOPIC RETROGRADE CHOLANGIOPANCREATOGRAPHY (ERCP) WITH PROPOFOL;  Surgeon: Carol Ada, MD;  [facility];  Service: Endoscopy;  Laterality: N/A;  . ERCP N/A 05/07/2013   Procedure: ENDOSCOPIC RETROGRADE CHOLANGIOPANCREATOGRAPHY (ERCP);  Surgeon: Beryle Beams, MD;  Location: [facility];  Service: Endoscopy;   Laterality: N/A;  start ercp first, if canulation fails switch to EUS   . ESOPHAGOGASTRODUODENOSCOPY (EGD) WITH PROPOFOL N/A 05/15/2019   Procedure: ESOPHAGOGASTRODUODENOSCOPY (EGD) WITH PROPOFOL;  Surgeon: Danie Binder, MD;  [facility];  Service: Endoscopy;  Laterality: N/A;  . EUS N/A 11/03/2012   Procedure: UPPER ENDOSCOPIC ULTRASOUND (EUS) LINEAR;  Surgeon: Beryle Beams, MD;  Location: [facility];  Service: Endoscopy;  Laterality: N/A;  . left arm    . NECK SURGERY    . REMOVAL OF STONES  06/11/2019   Procedure: REMOVAL OF STONES;  Surgeon: Carol Ada, MD;  Location: [facility];  Service: Endoscopy;;  . TUBAL LIGATION    . UPPER ESOPHAGEAL ENDOSCOPIC ULTRASOUND (EUS) N/A 06/11/2019   Procedure: UPPER ESOPHAGEAL ENDOSCOPIC ULTRASOUND (EUS);  Surgeon: Carol Ada, MD;  Location: [facility];  Service: Endoscopy;  Laterality: N/A;    There were no vitals filed for this visit.   Subjective Assessment - 12/14/19 1344    Subjective Patient states she is doing alright but she is having increased pain with the weather being bad. Her exercises are going well.    Limitations House hold activities;Standing;Walking;Other (comment)   transfers, bending   How long can you walk comfortably? 30 minutes    Patient Stated Goals to work her leg so it's not painful and keep her knee from locking up, and avoid surgery at all costs    Currently in Pain? Yes  Pain Score 6     Pain Location Hip    Pain Onset More than a month ago                             [facility] Adult PT Treatment/Exercise - 12/14/19 0001      Knee/Hip Exercises: Stretches   Active Hamstring Stretch Both;3 reps;30 seconds    Gastroc Stretch Limitations 3x30 second holds on slant board      Knee/Hip Exercises: Aerobic   Nustep hills 3 , level 2 x 5' for improved endurance      Knee/Hip Exercises: Standing   Heel Raises Both;10 reps    Knee Flexion Both;10 reps;2 sets    Hip Abduction  Both;2 sets;10 reps    Forward Step Up Both;5 reps;Step Height: 4";2 sets    Functional Squat 10 reps    Rocker Board 2 minutes                  PT Education - 12/14/19 1345    Education Details review of HEP, continuing HEP, exercise mechanics    Person(s) Educated Patient    Methods Explanation;Demonstration    Comprehension Verbalized understanding;Returned demonstration            PT Short Term Goals - 12/09/19 1334      PT SHORT TERM GOAL #1   Title Patient will be independent with HEP in order to improve functional outcomes.    Time 3    Period Weeks    Status On-going    Target Date 12/28/19      PT SHORT TERM GOAL #2   Title Patient will report at least 25% improvement in symptoms for improved quality of life.    Time 6    Period Weeks    Status On-going    Target Date 12/28/19             PT Long Term Goals - 12/09/19 1334      PT LONG TERM GOAL #1   Title Patient will report at least 75% improvement in symptoms for improved quality of life.    Time 6    Period Weeks    Status On-going      PT LONG TERM GOAL #2   Title Patient will improve FOTO score by at least 5 points in order to indicate improved tolerance to activity.    Time 6    Period Weeks    Status On-going      PT LONG TERM GOAL #3   Title Patient will be able to ambulate at least 325 feet in 2MWT in order to demonstrate improved gait speed for community ambulation.    Time 6    Period Weeks    Status On-going      PT LONG TERM GOAL #4   Title Patient will be able to perform sit to stand without UE use in order to demonstrate improved functional strength.    Time 6    Period Weeks    Status On-going                 Plan - 12/14/19 1345    Clinical Impression Statement Patient requires cueing for rest breaks due to tendency to keep going through exercises. She is showing improving TKE with L knee near 0 while completing hamstring stretch. She had extreme stretching  in bilateral calves with slant board stretch, exercise modified to decrease stretch. She  is shown how to complete calf stretch at home. She requires bilateral or Unilateral UE support with completing stairs and her quads fatigue quickly. Patient is highly motivated to improve function and works very hard with all exercises. She requires UE support for hip abduction exercise secondary to impaired balance. She is able to complete rockerboard without UE support and demonstrates good motor control. Patient completes nu step for improved muscular endurance and cardiovascular fitness. Patient will continue to benefit from skilled physical therapy in order to reduce impairment and improve function.    Personal Factors and Comorbidities Comorbidity 3+;Past/Current Experience;Time since onset of injury/illness/exacerbation;Fitness;Age    Comorbidities Chronic LBP, scolosis, neurological disease, chronic knee pain    Examination-Activity Limitations Bed Mobility;Bend;Locomotion Level;Transfers;Stairs;Stand;Toileting;Squat;Sleep;Dressing;Carry    Examination-Participation Restrictions Cleaning;Community Activity;[employer] Work;Meal Prep;Shop;Volunteer;Laundry    Stability/Clinical Decision Making Stable/Uncomplicated    Rehab Potential Fair    PT Frequency 2x / week    PT Duration 6 weeks    PT Treatment/Interventions ADLs/Self Care Home Management;Aquatic Therapy;Cryotherapy;Electrical Stimulation;Biofeedback;Moist Heat;Traction;Ultrasound;Parrafin;Fluidtherapy;Contrast Bath;DME Instruction;Gait training;Functional mobility training;Stair training;Therapeutic activities;Therapeutic exercise;Balance training;Neuromuscular re-education;Patient/family education;Orthotic Fit/Training;Manual techniques;Manual lymph drainage;Compression bandaging;Scar mobilization;Passive range of motion;Dry needling;Energy conservation;Splinting;Taping;Vasopneumatic Device;Vestibular;Spinal Manipulations;Joint Manipulations    PT Next Visit  Plan continue and progress LE strengthing and bilateral knee ROM/mobility as tolerated, possibly perform manual techniques for pain relief/mobility    PT Home Exercise Plan 6/15 LAQ, quad sets; added heelraise, squat and active hamstring stretch           Patient will benefit from skilled therapeutic intervention in order to improve the following deficits and impairments:  Abnormal gait, Decreased range of motion, Difficulty walking, Decreased endurance, Increased muscle spasms, Decreased activity tolerance, Decreased knowledge of use of DME, Decreased balance, Pain, Decreased mobility, Decreased strength, Improper body mechanics, Impaired flexibility, Hypomobility  Visit Diagnosis: Right knee pain, unspecified chronicity  Left knee pain, unspecified chronicity  Muscle weakness (generalized)  Other abnormalities of gait and mobility  Other symptoms and signs involving the musculoskeletal system  Stiffness of right knee, not elsewhere classified  Stiffness of left knee, not elsewhere classified     Problem List Patient Active Problem List   Diagnosis Date Noted  . Acute GI bleed due to bleeding pyloric/gastric ulcer 05/15/2019  . PUD (peptic ulcer disease)-EGD 05/15/2019 with bleeding pyloric/gastric ulcer, nonbleeding duodenal ulcers and gastritis   . Hematemesis with nausea   . Migraine headache 09/25/2015  . 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 Bilateral occipital neuralgia 01/02/2015  . Migraine 01/02/2015  . 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 DDD (degenerative disc disease), cervical 10/27/2014  . 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 DDD (degenerative disc disease), lumbosacral 10/27/2014  . Partial tear subscapularis tendon 12/27/2011    2:28 PM, 12/14/19 Mearl Latin PT, DPT Physical Therapist at [place], [place],  [address] Phone: [phone]   Fax:  [phone]  Name: Sharon Welch MRN: [id number] Date of Birth: [birth year]

## 2019-12-15 ENCOUNTER — Encounter (HOSPITAL_COMMUNITY): Payer: Self-pay | Admitting: Physical Therapy

## 2019-12-15 ENCOUNTER — Ambulatory Visit (HOSPITAL_COMMUNITY): Payer: Medicare Other | Admitting: Physical Therapy

## 2019-12-15 DIAGNOSIS — M25561 Pain in right knee: Secondary | ICD-10-CM | POA: Diagnosis not present

## 2019-12-15 DIAGNOSIS — M6281 Muscle weakness (generalized): Secondary | ICD-10-CM

## 2019-12-15 DIAGNOSIS — M25562 Pain in left knee: Secondary | ICD-10-CM

## 2019-12-15 DIAGNOSIS — M25661 Stiffness of right knee, not elsewhere classified: Secondary | ICD-10-CM

## 2019-12-15 DIAGNOSIS — R29898 Other symptoms and signs involving the musculoskeletal system: Secondary | ICD-10-CM

## 2019-12-15 DIAGNOSIS — R2689 Other abnormalities of gait and mobility: Secondary | ICD-10-CM

## 2019-12-15 DIAGNOSIS — M25662 Stiffness of left knee, not elsewhere classified: Secondary | ICD-10-CM

## 2019-12-15 NOTE — Therapy (Signed)
[place], [place], [address] Phone: [phone]   Fax:  [phone]  Physical Therapy Treatment  Patient Details  Name: [facility] MRN: [id number] Date of Birth: 29-Aug-1970 Referring Provider (PT): Arther Abbott MD   Encounter Date: 12/15/2019   PT End of Session - 12/15/19 1502    Visit Number 4    Number of Visits 12    Date for PT Re-Evaluation 01/18/20    Authorization Type Primary: medicare Secondary :medicaid of [place]    Progress Note Due on Visit 10    PT Start Time 1607    PT Stop Time 1525    PT Time Calculation (min) 38 min    Activity Tolerance Patient tolerated treatment well;Patient limited by pain    Behavior During Therapy [facility] for tasks assessed/performed           Past Medical History:  Diagnosis Date  . Bruising, spontaneous 12/08/2013  . Chronic back pain   . Chronic back pain   . DDD (degenerative disc disease), lumbar   . DDD (degenerative disc disease), lumbar   . Fibromyalgia   . Headache(784.0)   . Ovarian cyst   . Partial tear subscapularis tendon 12/27/2011  . Scoliosis   . Seizures ([place])    started 9/12-    Past Surgical History:  Procedure Laterality Date  . ABDOMINAL HYSTERECTOMY  2012  . BACK SURGERY  1989   scoliosis throsic-rods  . CESAREAN SECTION    . CHOLECYSTECTOMY N/A 11/17/2012   Procedure: LAPAROSCOPIC CHOLECYSTECTOMY WITH INTRAOPERATIVE CHOLANGIOGRAM;  Surgeon: Ralene Ok, MD;  Location: [place];  Service: General;  Laterality: N/A;  . ENDOSCOPIC RETROGRADE CHOLANGIOPANCREATOGRAPHY (ERCP) WITH PROPOFOL N/A 06/11/2019   Procedure: ENDOSCOPIC RETROGRADE CHOLANGIOPANCREATOGRAPHY (ERCP) WITH PROPOFOL;  Surgeon: Carol Ada, MD;  [facility];  Service: Endoscopy;  Laterality: N/A;  . ERCP N/A 05/07/2013   Procedure: ENDOSCOPIC RETROGRADE CHOLANGIOPANCREATOGRAPHY (ERCP);  Surgeon: Beryle Beams, MD;  Location: [facility];  Service: Endoscopy;   Laterality: N/A;  start ercp first, if canulation fails switch to EUS   . ESOPHAGOGASTRODUODENOSCOPY (EGD) WITH PROPOFOL N/A 05/15/2019   Procedure: ESOPHAGOGASTRODUODENOSCOPY (EGD) WITH PROPOFOL;  Surgeon: Danie Binder, MD;  [facility];  Service: Endoscopy;  Laterality: N/A;  . EUS N/A 11/03/2012   Procedure: UPPER ENDOSCOPIC ULTRASOUND (EUS) LINEAR;  Surgeon: Beryle Beams, MD;  [facility]: [facility];  Service: Endoscopy;  Laterality: N/A;  . left arm    . NECK SURGERY    . REMOVAL OF STONES  06/11/2019   Procedure: REMOVAL OF STONES;  Surgeon: Carol Ada, MD;  [facility];  Service: Endoscopy;;  . TUBAL LIGATION    . UPPER ESOPHAGEAL ENDOSCOPIC ULTRASOUND (EUS) N/A 06/11/2019   Procedure: UPPER ESOPHAGEAL ENDOSCOPIC ULTRASOUND (EUS);  Surgeon: Carol Ada, MD;  Location: [facility];  Service: Endoscopy;  Laterality: N/A;    There were no vitals filed for this visit.   Subjective Assessment - 12/15/19 1451    Subjective PT states that her knees are sore she feels it's due to the rain coming in    Limitations House hold activities;Standing;Walking;Other (comment)    How long can you walk comfortably? 30 minutes    Patient Stated Goals to work her leg so it's not painful and keep her knee from locking up, and avoid surgery at all costs    Currently in Pain? Yes    Pain Score 4     Pain Location  Knee    Pain Orientation Right;Left    Pain Descriptors / Indicators Aching    Pain Type Chronic pain    Pain Onset More than a month ago    Pain Frequency Constant    Aggravating Factors  anything    Pain Relieving Factors nothing    Effect of Pain on Daily Activities limits                             [facility] Adult PT Treatment/Exercise - 12/15/19 0001      Exercises   Exercises Knee/Hip      Knee/Hip Exercises: Stretches   Active Hamstring Stretch Both;3 reps;30 seconds    Knee: Self-Stretch to increase Flexion Both;3 reps     Gastroc Stretch Limitations 3x30 second holds on slant board      Knee/Hip Exercises: Aerobic   Nustep hills 3 , level 2 x 5' for improved endurance      Knee/Hip Exercises: Standing   Heel Raises Both;15 reps    Knee Flexion Both;15 reps    Knee Flexion Limitations 3#    Hip Abduction Stengthening;Both;15 reps    Forward Step Up Both;10 reps    Functional Squat 15 reps    Rocker Board 2 minutes    SLS B x 3      Knee/Hip Exercises: Supine   Quad Sets 10 reps    Short Arc Quad Sets Strengthening;Both;10 reps                    PT Short Term Goals - 12/09/19 1334      PT SHORT TERM GOAL #1   Title Patient will be independent with HEP in order to improve functional outcomes.    Time 3    Period Weeks    Status On-going    Target Date 12/28/19      PT SHORT TERM GOAL #2   Title Patient will report at least 25% improvement in symptoms for improved quality of life.    Time 6    Period Weeks    Status On-going    Target Date 12/28/19             PT Long Term Goals - 12/09/19 1334      PT LONG TERM GOAL #1   Title Patient will report at least 75% improvement in symptoms for improved quality of life.    Time 6    Period Weeks    Status On-going      PT LONG TERM GOAL #2   Title Patient will improve FOTO score by at least 5 points in order to indicate improved tolerance to activity.    Time 6    Period Weeks    Status On-going      PT LONG TERM GOAL #3   Title Patient will be able to ambulate at least 325 feet in 2MWT in order to demonstrate improved gait speed for community ambulation.    Time 6    Period Weeks    Status On-going      PT LONG TERM GOAL #4   Title Patient will be able to perform sit to stand without UE use in order to demonstrate improved functional strength.    Time 6    Period Weeks    Status On-going                 Plan - 12/15/19 1503  Clinical Impression Statement PT noted total knee extension with lunges B but  when she walks pt keeps her knees bent, therapist explained that this will not help her back or her knees and to attempt to walkd with full extension in knees   Due to increased pain no additional exercises were added to pt program.  Therapist reminded pt several times to keep core engaged and not to go to pain with exercises.    Personal Factors and Comorbidities Comorbidity 3+;Past/Current Experience;Time since onset of injury/illness/exacerbation;Fitness;Age    Comorbidities Chronic LBP, scolosis, neurological disease, chronic knee pain    Examination-Activity Limitations Bed Mobility;Bend;Locomotion Level;Transfers;Stairs;Stand;Toileting;Squat;Sleep;Dressing;Carry    Examination-Participation Restrictions Cleaning;Community Activity;[employer] Work;Meal Prep;Shop;Volunteer;Laundry    Stability/Clinical Decision Making Stable/Uncomplicated    Rehab Potential Fair    PT Frequency 2x / week    PT Duration 6 weeks    PT Treatment/Interventions ADLs/Self Care Home Management;Aquatic Therapy;Cryotherapy;Electrical Stimulation;Biofeedback;Moist Heat;Traction;Ultrasound;Parrafin;Fluidtherapy;Contrast Bath;DME Instruction;Gait training;Functional mobility training;Stair training;Therapeutic activities;Therapeutic exercise;Balance training;Neuromuscular re-education;Patient/family education;Orthotic Fit/Training;Manual techniques;Manual lymph drainage;Compression bandaging;Scar mobilization;Passive range of motion;Dry needling;Energy conservation;Splinting;Taping;Vasopneumatic Device;Vestibular;Spinal Manipulations;Joint Manipulations    PT Next Visit Plan continue and progress LE strengthing and bilateral knee ROM/mobility as tolerated, possibly perform manual techniques for pain relief/mobility    PT Home Exercise Plan 6/15 LAQ, quad sets; added heelraise, squat and active hamstring stretch           Patient will benefit from skilled therapeutic intervention in order to improve the following deficits and  impairments:  Abnormal gait, Decreased range of motion, Difficulty walking, Decreased endurance, Increased muscle spasms, Decreased activity tolerance, Decreased knowledge of use of DME, Decreased balance, Pain, Decreased mobility, Decreased strength, Improper body mechanics, Impaired flexibility, Hypomobility  Visit Diagnosis: Right knee pain, unspecified chronicity  Left knee pain, unspecified chronicity  Muscle weakness (generalized)  Other abnormalities of gait and mobility  Other symptoms and signs involving the musculoskeletal system  Stiffness of right knee, not elsewhere classified  Stiffness of left knee, not elsewhere classified     Problem List Patient Active Problem List   Diagnosis Date Noted  . Acute GI bleed due to bleeding pyloric/gastric ulcer 05/15/2019  . PUD (peptic ulcer disease)-EGD 05/15/2019 with bleeding pyloric/gastric ulcer, nonbleeding duodenal ulcers and gastritis   . Hematemesis with nausea   . Migraine headache 09/25/2015  . 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 Bilateral occipital neuralgia 01/02/2015  . Migraine 01/02/2015  . 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 DDD (degenerative disc disease), cervical 10/27/2014  . 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 DDD (degenerative disc disease), lumbosacral 10/27/2014  . Partial tear subscapularis tendon 12/27/2011    Rayetta Humphrey, PT CLT [phone] 12/15/2019, 3:30 PM  [facility] [address] [place], [place], [address] Phone: [phone]   Fax:  [phone]  Name: Sharon [facility] MRN: [id number] Date of Birth: 09/01/1970

## 2019-12-16 ENCOUNTER — Encounter (HOSPITAL_COMMUNITY): Payer: Medicare Other | Admitting: Physical Therapy

## 2019-12-21 ENCOUNTER — Encounter (HOSPITAL_COMMUNITY): Payer: Self-pay | Admitting: Physical Therapy

## 2019-12-21 ENCOUNTER — Ambulatory Visit (HOSPITAL_COMMUNITY): Payer: Medicare Other | Admitting: Physical Therapy

## 2019-12-21 ENCOUNTER — Other Ambulatory Visit: Payer: Self-pay

## 2019-12-21 DIAGNOSIS — R29898 Other symptoms and signs involving the musculoskeletal system: Secondary | ICD-10-CM

## 2019-12-21 DIAGNOSIS — R2689 Other abnormalities of gait and mobility: Secondary | ICD-10-CM

## 2019-12-21 DIAGNOSIS — M25661 Stiffness of right knee, not elsewhere classified: Secondary | ICD-10-CM

## 2019-12-21 DIAGNOSIS — M6281 Muscle weakness (generalized): Secondary | ICD-10-CM

## 2019-12-21 DIAGNOSIS — M25662 Stiffness of left knee, not elsewhere classified: Secondary | ICD-10-CM

## 2019-12-21 DIAGNOSIS — M25561 Pain in right knee: Secondary | ICD-10-CM | POA: Diagnosis not present

## 2019-12-21 DIAGNOSIS — M25562 Pain in left knee: Secondary | ICD-10-CM

## 2019-12-21 NOTE — Therapy (Signed)
[place], [place], [address] Phone: [phone]   Fax:  [phone]  Physical Therapy Treatment  Patient Details  Name: Sharon [facility] MRN: [id number] Date of Birth: [birth year] Referring Provider (PT): Arther Abbott MD   Encounter Date: 12/21/2019   PT End of Session - 12/21/19 1258    Visit Number 5    Number of Visits 12    Date for PT Re-Evaluation 01/18/20    Authorization Type Primary: medicare Secondary :medicaid of [place]    Progress Note Due on Visit 10    PT Start Time 1300    PT Stop Time 1340    PT Time Calculation (min) 40 min    Activity Tolerance Patient tolerated treatment well;Patient limited by pain    Behavior During Therapy [facility] for tasks assessed/performed           Past Medical History:  Diagnosis Date   Bruising, spontaneous 12/08/2013   Chronic back pain    Chronic back pain    DDD (degenerative disc disease), lumbar    DDD (degenerative disc disease), lumbar    Fibromyalgia    Headache(784.0)    Ovarian cyst    Partial tear subscapularis tendon 12/27/2011   Scoliosis    Seizures ([place])    started 9/12-    Past Surgical History:  Procedure Laterality Date   ABDOMINAL HYSTERECTOMY  2012   BACK SURGERY  1989   scoliosis throsic-rods   CESAREAN SECTION     CHOLECYSTECTOMY N/A 11/17/2012   Procedure: LAPAROSCOPIC CHOLECYSTECTOMY WITH INTRAOPERATIVE CHOLANGIOGRAM;  Surgeon: Ralene Ok, MD;  Location: [facility];  Service: General;  Laterality: N/A;   ENDOSCOPIC RETROGRADE CHOLANGIOPANCREATOGRAPHY (ERCP) WITH PROPOFOL N/A 06/11/2019   Procedure: ENDOSCOPIC RETROGRADE CHOLANGIOPANCREATOGRAPHY (ERCP) WITH PROPOFOL;  Surgeon: Carol Ada, MD;  [facility];  Service: Endoscopy;  Laterality: N/A;   ERCP N/A 05/07/2013   Procedure: ENDOSCOPIC RETROGRADE CHOLANGIOPANCREATOGRAPHY (ERCP);  Surgeon: Beryle Beams, MD;  Location[facility];  Service: Endoscopy;   Laterality: N/A;  start ercp first, if canulation fails switch to EUS    ESOPHAGOGASTRODUODENOSCOPY (EGD) WITH PROPOFOL N/A 05/15/2019   Procedure: ESOPHAGOGASTRODUODENOSCOPY (EGD) WITH PROPOFOL;  Surgeon: Danie Binder, MD;  [facility];  Service: Endoscopy;  Laterality: N/A;   EUS N/A 11/03/2012   Procedure: UPPER ENDOSCOPIC ULTRASOUND (EUS) LINEAR;  Surgeon: Beryle Beams, MD;  [facility];  Service: Endoscopy;  Laterality: N/A;   left arm     NECK SURGERY     REMOVAL OF STONES  06/11/2019   Procedure: REMOVAL OF STONES;  Surgeon: Carol Ada, MD;  [facility];  Service: Endoscopy;;   TUBAL LIGATION     UPPER ESOPHAGEAL ENDOSCOPIC ULTRASOUND (EUS) N/A 06/11/2019   Procedure: UPPER ESOPHAGEAL ENDOSCOPIC ULTRASOUND (EUS);  Surgeon: Carol Ada, MD;  Location: [facility];  Service: Endoscopy;  Laterality: N/A;    There were no vitals filed for this visit.   Subjective Assessment - 12/21/19 1257    Subjective Patient states she has been feeling good. Friday she slept a lot but does think it was related more to her rather than anything. Patient continues to have pain at night. Patient states her exercises are going well.    Limitations House hold activities;Standing;Walking;Other (comment)    How long can you walk comfortably? 30 minutes    Patient Stated Goals to work her leg so its not painful and keep her knee from locking up, and avoid surgery  at all costs    Currently in Pain? Yes    Pain Score 4     Pain Location Knee    Pain Onset More than a month ago                             [facility] Adult PT Treatment/Exercise - 12/21/19 0001      Knee/Hip Exercises: Stretches   Active Hamstring Stretch Both;3 reps;30 seconds    Gastroc Stretch Limitations 3x30 second holds on slant board      Knee/Hip Exercises: Standing   Heel Raises Both;15 reps    Knee Flexion Both;15 reps    Knee Flexion Limitations 3#    Hip  Abduction Both;2 sets;10 reps    Abduction Limitations 3#    Forward Step Up Both;10 reps;Step Height: 4"    Functional Squat 15 reps    Rocker Board 2 minutes    SLS 2x30 seconds bilateral with intermittent UE support    Other Standing Knee Exercises tandem stance 2x30 seconds bilateral       Knee/Hip Exercises: Supine   Knee Extension AROM    Knee Extension Limitations L: lacking 24; R lacking 17    Knee Flexion AROM    Knee Flexion Limitations L: 147 R: 125      Knee/Hip Exercises: Sidelying   Clams 2x 15 bilateral                   PT Education - 12/21/19 1258    Education Details review of HEP, continuing HEP, exercise mechanics    Person(s) Educated Patient    Methods Explanation;Demonstration    Comprehension Verbalized understanding;Returned demonstration            PT Short Term Goals - 12/09/19 1334      PT SHORT TERM GOAL #1   Title Patient will be independent with HEP in order to improve functional outcomes.    Time 3    Period Weeks    Status On-going    Target Date 12/28/19      PT SHORT TERM GOAL #2   Title Patient will report at least 25% improvement in symptoms for improved quality of life.    Time 6    Period Weeks    Status On-going    Target Date 12/28/19             PT Long Term Goals - 12/09/19 1334      PT LONG TERM GOAL #1   Title Patient will report at least 75% improvement in symptoms for improved quality of life.    Time 6    Period Weeks    Status On-going      PT LONG TERM GOAL #2   Title Patient will improve FOTO score by at least 5 points in order to indicate improved tolerance to activity.    Time 6    Period Weeks    Status On-going      PT LONG TERM GOAL #3   Title Patient will be able to ambulate at least 325 feet in 2MWT in order to demonstrate improved gait speed for community ambulation.    Time 6    Period Weeks    Status On-going      PT LONG TERM GOAL #4   Title Patient will be able to perform sit  to stand without UE use in order to demonstrate improved functional strength.  Time 6    Period Weeks    Status On-going                 Plan - 12/21/19 1258    Clinical Impression Statement Patient demonstrates improving bilateral knee flexion and extension ROM. She has c/o back pain with clams which improves with cueing to decrease excessive lumbar movement. Hip abductors fatigue quickly with clam exercises. Patient given cueing to decrease intensity of stretches if painful to complete. She lacks TKE ROM with standing hamstring stretch at step with lacking R>L, patient improves to full extension on L on second rep. Patient tolerates increased resistance with hip abduction exercise. She requires bilateral UE support with step up exercise with minimal UE assist other than for balance. She is given min verbal cueing for squat mechanics with good carry over. Patient will continue to benefit from skilled physical therapy in order to reduce impairment and improve function.    Personal Factors and Comorbidities Comorbidity 3+;Past/Current Experience;Time since onset of injury/illness/exacerbation;Fitness;Age    Comorbidities Chronic LBP, scolosis, neurological disease, chronic knee pain    Examination-Activity Limitations Bed Mobility;Bend;Locomotion Level;Transfers;Stairs;Stand;Toileting;Squat;Sleep;Dressing;Carry    Examination-Participation Restrictions Cleaning;Community Activity;[employer] Work;Meal Prep;Shop;Volunteer;Laundry    Stability/Clinical Decision Making Stable/Uncomplicated    Rehab Potential Fair    PT Frequency 2x / week    PT Duration 6 weeks    PT Treatment/Interventions ADLs/Self Care Home Management;Aquatic Therapy;Cryotherapy;Electrical Stimulation;Biofeedback;Moist Heat;Traction;Ultrasound;Parrafin;Fluidtherapy;Contrast Bath;DME Instruction;Gait training;Functional mobility training;Stair training;Therapeutic activities;Therapeutic exercise;Balance training;Neuromuscular  re-education;Patient/family education;Orthotic Fit/Training;Manual techniques;Manual lymph drainage;Compression bandaging;Scar mobilization;Passive range of motion;Dry needling;Energy conservation;Splinting;Taping;Vasopneumatic Device;Vestibular;Spinal Manipulations;Joint Manipulations    PT Next Visit Plan continue and progress LE strengthing and bilateral knee ROM/mobility as tolerated, possibly perform manual techniques for pain relief/mobility    PT Home Exercise Plan 6/15 LAQ, quad sets; added heelraise, squat and active hamstring stretch 6/29 clams           Patient will benefit from skilled therapeutic intervention in order to improve the following deficits and impairments:  Abnormal gait, Decreased range of motion, Difficulty walking, Decreased endurance, Increased muscle spasms, Decreased activity tolerance, Decreased knowledge of use of DME, Decreased balance, Pain, Decreased mobility, Decreased strength, Improper body mechanics, Impaired flexibility, Hypomobility  Visit Diagnosis: Right knee pain, unspecified chronicity  Left knee pain, unspecified chronicity  Muscle weakness (generalized)  Other abnormalities of gait and mobility  Other symptoms and signs involving the musculoskeletal system  Stiffness of right knee, not elsewhere classified  Stiffness of left knee, not elsewhere classified     Problem List Patient Active Problem List   Diagnosis Date Noted   Acute GI bleed due to bleeding pyloric/gastric ulcer 05/15/2019   PUD (peptic ulcer disease)-EGD 05/15/2019 with bleeding pyloric/gastric ulcer, nonbleeding duodenal ulcers and gastritis    Hematemesis with nausea    Migraine headache 09/25/2015   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   Bilateral occipital neuralgia 01/02/2015   Migraine 01/02/2015   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/2014   DDD (degenerative disc disease), cervical 10/27/2014   DDD (degenerative disc disease), thoracic  10/27/2014   DDD (degenerative disc disease), lumbosacral 10/27/2014   Partial tear subscapularis tendon 12/27/2011    1:42 PM, 12/21/19 Mearl Latin PT, DPT Physical Therapist at [facility] [address] [place], [place], [address] Phone: [phone]   Fax:  [phone]  Name: Sharon [facility] MRN: [id number] Date of Birth: Apr 07, 1971

## 2019-12-23 ENCOUNTER — Ambulatory Visit (HOSPITAL_COMMUNITY): Payer: Medicare Other | Attending: Orthopedic Surgery | Admitting: Physical Therapy

## 2019-12-23 ENCOUNTER — Other Ambulatory Visit: Payer: Self-pay

## 2019-12-23 ENCOUNTER — Encounter (HOSPITAL_COMMUNITY): Payer: Self-pay | Admitting: Physical Therapy

## 2019-12-23 DIAGNOSIS — M25662 Stiffness of left knee, not elsewhere classified: Secondary | ICD-10-CM | POA: Diagnosis not present

## 2019-12-23 DIAGNOSIS — M6281 Muscle weakness (generalized): Secondary | ICD-10-CM

## 2019-12-23 DIAGNOSIS — R2689 Other abnormalities of gait and mobility: Secondary | ICD-10-CM

## 2019-12-23 DIAGNOSIS — M25661 Stiffness of right knee, not elsewhere classified: Secondary | ICD-10-CM

## 2019-12-23 DIAGNOSIS — R29898 Other symptoms and signs involving the musculoskeletal system: Secondary | ICD-10-CM | POA: Diagnosis not present

## 2019-12-23 DIAGNOSIS — M25561 Pain in right knee: Secondary | ICD-10-CM | POA: Diagnosis not present

## 2019-12-23 DIAGNOSIS — M25562 Pain in left knee: Secondary | ICD-10-CM

## 2019-12-23 NOTE — Therapy (Signed)
[place], [place], [address] Phone: [phone]   Fax:  [phone]  Physical Therapy Treatment  Patient Details  Name: Sharon Welch MRN: [id number] Date of Birth: Jan 14, 1971 Referring Provider (PT): Arther Abbott MD   Encounter Date: 12/23/2019   PT End of Session - 12/23/19 1428    Visit Number 6    Number of Visits 12    Date for PT Re-Evaluation 01/18/20    Authorization Type Primary: medicare Secondary :medicaid of [place]    Progress Note Due on Visit 10    PT Start Time 1433    PT Stop Time 1511    PT Time Calculation (min) 38 min    Activity Tolerance Patient tolerated treatment well;Patient limited by pain    Behavior During Therapy [facility] for tasks assessed/performed           Past Medical History:  Diagnosis Date   Bruising, spontaneous 12/08/2013   Chronic back pain    Chronic back pain    DDD (degenerative disc disease), lumbar    DDD (degenerative disc disease), lumbar    Fibromyalgia    Headache(784.0)    Ovarian cyst    Partial tear subscapularis tendon 12/27/2011   Scoliosis    Seizures ([place])    started 9/12-    Past Surgical History:  Procedure Laterality Date   ABDOMINAL HYSTERECTOMY  2012   BACK SURGERY  1989   scoliosis throsic-rods   CESAREAN SECTION     CHOLECYSTECTOMY N/A 11/17/2012   Procedure: LAPAROSCOPIC CHOLECYSTECTOMY WITH INTRAOPERATIVE CHOLANGIOGRAM;  Surgeon: Ralene Ok, MD;  Location: [facility];  Service: General;  Laterality: N/A;   ENDOSCOPIC RETROGRADE CHOLANGIOPANCREATOGRAPHY (ERCP) WITH PROPOFOL N/A 06/11/2019   Procedure: ENDOSCOPIC RETROGRADE CHOLANGIOPANCREATOGRAPHY (ERCP) WITH PROPOFOL;  Surgeon: Carol Ada, MD;  [facility];  Service: Endoscopy;  Laterality: N/A;   ERCP N/A 05/07/2013   Procedure: ENDOSCOPIC RETROGRADE CHOLANGIOPANCREATOGRAPHY (ERCP);  Surgeon: Beryle Beams, MD;  Location: [facility];  Service: Endoscopy;   Laterality: N/A;  start ercp first, if canulation fails switch to EUS    ESOPHAGOGASTRODUODENOSCOPY (EGD) WITH PROPOFOL N/A 05/15/2019   Procedure: ESOPHAGOGASTRODUODENOSCOPY (EGD) WITH PROPOFOL;  Surgeon: Danie Binder, MD;  [facility];  Service: Endoscopy;  Laterality: N/A;   EUS N/A 11/03/2012   Procedure: UPPER ENDOSCOPIC ULTRASOUND (EUS) LINEAR;  Surgeon: Beryle Beams, MD;  [facility];  Service: Endoscopy;  Laterality: N/A;   left arm     NECK SURGERY     REMOVAL OF STONES  06/11/2019   Procedure: REMOVAL OF STONES;  Surgeon: Carol Ada, MD;  [facility];  Service: Endoscopy;;   TUBAL LIGATION     UPPER ESOPHAGEAL ENDOSCOPIC ULTRASOUND (EUS) N/A 06/11/2019   Procedure: UPPER ESOPHAGEAL ENDOSCOPIC ULTRASOUND (EUS);  Surgeon: Carol Ada, MD;  Location: [facility];  Service: Endoscopy;  Laterality: N/A;    There were no vitals filed for this visit.   Subjective Assessment - 12/23/19 1427    Subjective Patient states achy knees today and she thinks its because of the weather. Patient states a little tired after last session.    Limitations House hold activities;Standing;Walking;Other (comment)    How long can you walk comfortably? 30 minutes    Patient Stated Goals to work her leg so its not painful and keep her knee from locking up, and avoid surgery at all costs    Currently in Pain? Yes    Pain Score 5  Pain Location Knee    Pain Onset More than a month ago                             [facility] Adult PT Treatment/Exercise - 12/23/19 0001      Knee/Hip Exercises: Stretches   Active Hamstring Stretch Both;3 reps;30 seconds    Gastroc Stretch Limitations 3x30 second holds on slant board      Knee/Hip Exercises: Standing   Heel Raises Both;20 reps    Knee Flexion Both;15 reps;2 sets    Knee Flexion Limitations 3#    Terminal Knee Extension Both;5 reps    Terminal Knee Extension Limitations purple band 5  second holds bilateral    Hip Abduction Both;2 sets;10 reps    Abduction Limitations 3#    Hip Extension Both;2 sets;10 reps    Extension Limitations 3#    Lateral Step Up Both;1 set;10 reps;Hand Hold: 2;Step Height: 6"    Forward Step Up Both;10 reps;Step Height: 6";Hand Hold: 1    Other Standing Knee Exercises lateral stepping 8x15 feet                  PT Education - 12/23/19 1427    Education Details review of HEP, continuing HEP, exercise mechanics    Person(s) Educated Patient    Methods Explanation;Demonstration    Comprehension Verbalized understanding;Returned demonstration            PT Short Term Goals - 12/09/19 1334      PT SHORT TERM GOAL #1   Title Patient will be independent with HEP in order to improve functional outcomes.    Time 3    Period Weeks    Status On-going    Target Date 12/28/19      PT SHORT TERM GOAL #2   Title Patient will report at least 25% improvement in symptoms for improved quality of life.    Time 6    Period Weeks    Status On-going    Target Date 12/28/19             PT Long Term Goals - 12/09/19 1334      PT LONG TERM GOAL #1   Title Patient will report at least 75% improvement in symptoms for improved quality of life.    Time 6    Period Weeks    Status On-going      PT LONG TERM GOAL #2   Title Patient will improve FOTO score by at least 5 points in order to indicate improved tolerance to activity.    Time 6    Period Weeks    Status On-going      PT LONG TERM GOAL #3   Title Patient will be able to ambulate at least 325 feet in 2MWT in order to demonstrate improved gait speed for community ambulation.    Time 6    Period Weeks    Status On-going      PT LONG TERM GOAL #4   Title Patient will be able to perform sit to stand without UE use in order to demonstrate improved functional strength.    Time 6    Period Weeks    Status On-going                 Plan - 12/23/19 1428    Clinical  Impression Statement Patient able to complete most previously completed exercises with minimal cueing. She is demonstrates  improving activity tolerance and is able to complete increased reps of exercises today. She is also able to complete step ups on increased height stair today with good mechanics and no increase in knee symptoms. She completes stair exercises with minimal UE support and is showing improving quad and hip strength. She tolerates TKE exercise but fatigues quickly in quad musculature. Patient will continue to benefit from skilled physical therapy in order to reduce impairment and improve function.    Personal Factors and Comorbidities Comorbidity 3+;Past/Current Experience;Time since onset of injury/illness/exacerbation;Fitness;Age    Comorbidities Chronic LBP, scolosis, neurological disease, chronic knee pain    Examination-Activity Limitations Bed Mobility;Bend;Locomotion Level;Transfers;Stairs;Stand;Toileting;Squat;Sleep;Dressing;Carry    Examination-Participation Restrictions Cleaning;Community Activity;[employer] Work;Meal Prep;Shop;Volunteer;Laundry    Stability/Clinical Decision Making Stable/Uncomplicated    Rehab Potential Fair    PT Frequency 2x / week    PT Duration 6 weeks    PT Treatment/Interventions ADLs/Self Care Home Management;Aquatic Therapy;Cryotherapy;Electrical Stimulation;Biofeedback;Moist Heat;Traction;Ultrasound;Parrafin;Fluidtherapy;Contrast Bath;DME Instruction;Gait training;Functional mobility training;Stair training;Therapeutic activities;Therapeutic exercise;Balance training;Neuromuscular re-education;Patient/family education;Orthotic Fit/Training;Manual techniques;Manual lymph drainage;Compression bandaging;Scar mobilization;Passive range of motion;Dry needling;Energy conservation;Splinting;Taping;Vasopneumatic Device;Vestibular;Spinal Manipulations;Joint Manipulations    PT Next Visit Plan continue and progress LE strengthing and bilateral knee ROM/mobility as  tolerated, possibly perform manual techniques for pain relief/mobility    PT Home Exercise Plan 6/15 LAQ, quad sets; added heelraise, squat and active hamstring stretch 6/29 clams           Patient will benefit from skilled therapeutic intervention in order to improve the following deficits and impairments:  Abnormal gait, Decreased range of motion, Difficulty walking, Decreased endurance, Increased muscle spasms, Decreased activity tolerance, Decreased knowledge of use of DME, Decreased balance, Pain, Decreased mobility, Decreased strength, Improper body mechanics, Impaired flexibility, Hypomobility  Visit Diagnosis: Right knee pain, unspecified chronicity  Left knee pain, unspecified chronicity  Muscle weakness (generalized)  Other abnormalities of gait and mobility  Other symptoms and signs involving the musculoskeletal system  Stiffness of right knee, not elsewhere classified  Stiffness of left knee, not elsewhere classified     Problem List Patient Active Problem List   Diagnosis Date Noted   Acute GI bleed due to bleeding pyloric/gastric ulcer 05/15/2019   PUD (peptic ulcer disease)-EGD 05/15/2019 with bleeding pyloric/gastric ulcer, nonbleeding duodenal ulcers and gastritis    Hematemesis with nausea    Migraine headache 09/25/2015   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   Bilateral occipital neuralgia 01/02/2015   Migraine 01/02/2015   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 [place], [place], [address] Phone: [phone]   Fax:  [phone]  Name: Sharon Toro  W[place] MRN: [id number] Date of Birth: [birth year]

## 2019-12-28 ENCOUNTER — Encounter (HOSPITAL_COMMUNITY): Payer: Self-pay | Admitting: Physical Therapy

## 2019-12-28 ENCOUNTER — Ambulatory Visit (HOSPITAL_COMMUNITY): Payer: Medicare Other | Admitting: Physical Therapy

## 2019-12-28 ENCOUNTER — Other Ambulatory Visit: Payer: Self-pay

## 2019-12-28 DIAGNOSIS — R29898 Other symptoms and signs involving the musculoskeletal system: Secondary | ICD-10-CM

## 2019-12-28 DIAGNOSIS — M25562 Pain in left knee: Secondary | ICD-10-CM

## 2019-12-28 DIAGNOSIS — M25561 Pain in right knee: Secondary | ICD-10-CM | POA: Diagnosis not present

## 2019-12-28 DIAGNOSIS — M6281 Muscle weakness (generalized): Secondary | ICD-10-CM

## 2019-12-28 DIAGNOSIS — M25662 Stiffness of left knee, not elsewhere classified: Secondary | ICD-10-CM

## 2019-12-28 DIAGNOSIS — M25661 Stiffness of right knee, not elsewhere classified: Secondary | ICD-10-CM

## 2019-12-28 DIAGNOSIS — R2689 Other abnormalities of gait and mobility: Secondary | ICD-10-CM

## 2019-12-28 NOTE — Therapy (Signed)
[place], [place], [address] Phone: [phone]   Fax:  [phone]  Physical Therapy Treatment  Patient Details  Name: Sharon Welch MRN: [id number] Date of Birth: 1970/10/25 Referring Provider (PT): Arther Abbott MD   Encounter Date: 12/28/2019   PT End of Session - 12/28/19 1120    Visit Number 7    Number of Visits 12    Date for PT Re-Evaluation 01/18/20    Authorization Type Primary: medicare Secondary :medicaid of [employer]    Progress Note Due on Visit 10    PT Start Time 1117    PT Stop Time 1157    PT Time Calculation (min) 40 min    Activity Tolerance Patient tolerated treatment well    Behavior During Therapy [facility] for tasks assessed/performed           Past Medical History:  Diagnosis Date  . Bruising, spontaneous 12/08/2013  . Chronic back pain   . Chronic back pain   . DDD (degenerative disc disease), lumbar   . DDD (degenerative disc disease), lumbar   . Fibromyalgia   . Headache(784.0)   . Ovarian cyst   . Partial tear subscapularis tendon 12/27/2011  . Scoliosis   . Seizures ([place])    started 9/12-    Past Surgical History:  Procedure Laterality Date  . ABDOMINAL HYSTERECTOMY  2012  . BACK SURGERY  1989   scoliosis throsic-rods  . CESAREAN SECTION    . CHOLECYSTECTOMY N/A 11/17/2012   Procedure: LAPAROSCOPIC CHOLECYSTECTOMY WITH INTRAOPERATIVE CHOLANGIOGRAM;  Surgeon: Ralene Ok, MD;  Location: [facility];  Service: General;  Laterality: N/A;  . ENDOSCOPIC RETROGRADE CHOLANGIOPANCREATOGRAPHY (ERCP) WITH PROPOFOL N/A 06/11/2019   Procedure: ENDOSCOPIC RETROGRADE CHOLANGIOPANCREATOGRAPHY (ERCP) WITH PROPOFOL;  Surgeon: Carol Ada, MD;  [facility];  Service: Endoscopy;  Laterality: N/A;  . ERCP N/A 05/07/2013   Procedure: ENDOSCOPIC RETROGRADE CHOLANGIOPANCREATOGRAPHY (ERCP);  Surgeon: Beryle Beams, MD;  Location: [facility];  Service: Endoscopy;  Laterality: N/A;  start ercp  first, if canulation fails switch to EUS   . ESOPHAGOGASTRODUODENOSCOPY (EGD) WITH PROPOFOL N/A 05/15/2019   Procedure: ESOPHAGOGASTRODUODENOSCOPY (EGD) WITH PROPOFOL;  Surgeon: Danie Binder, MD;  [facility];  Service: Endoscopy;  Laterality: N/A;  . EUS N/A 11/03/2012   Procedure: UPPER ENDOSCOPIC ULTRASOUND (EUS) LINEAR;  Surgeon: Beryle Beams, MD;  [facility]: [facility];  Service: Endoscopy;  Laterality: N/A;  . left arm    . NECK SURGERY    . REMOVAL OF STONES  06/11/2019   Procedure: REMOVAL OF STONES;  Surgeon: Carol Ada, MD;  [facility]: [facility];  Service: Endoscopy;;  . TUBAL LIGATION    . UPPER ESOPHAGEAL ENDOSCOPIC ULTRASOUND (EUS) N/A 06/11/2019   Procedure: UPPER ESOPHAGEAL ENDOSCOPIC ULTRASOUND (EUS);  Surgeon: Carol Ada, MD;  Location: [facility];  Service: Endoscopy;  Laterality: N/A;    There were no vitals filed for this visit.   Subjective Assessment - 12/28/19 1117    Subjective Patient states her knees are feeling good. Her cat slept on her knees keeping them warm. Exercises are going well.    Limitations House hold activities;Standing;Walking;Other (comment)    How long can you walk comfortably? 30 minutes    Patient Stated Goals to work her leg so it's not painful and keep her knee from locking up, and avoid surgery at all costs    Currently in Pain? Yes    Pain Score 4     Pain  Location Knee    Pain Orientation Right;Left    Pain Onset More than a month ago                             [facility] Adult PT Treatment/Exercise - 12/28/19 0001      Knee/Hip Exercises: Stretches   Active Hamstring Stretch Both;3 reps;30 seconds    Gastroc Stretch Limitations 3x30 second holds on slant board      Knee/Hip Exercises: Standing   Heel Raises Both;20 reps    Knee Flexion Both;15 reps;2 sets    Knee Flexion Limitations 4#    Hip Abduction Both;2 sets;10 reps    Abduction Limitations 4#    Hip Extension Both;2 sets;10 reps     Extension Limitations 4#    Lateral Step Up Both;1 set;10 reps;Step Height: 6"    Forward Step Up Both;10 reps;Step Height: 6";Hand Hold: 0    Functional Squat 15 reps;2 sets    Rocker Board 2 minutes    Other Standing Knee Exercises lateral stepping 8x15 feet                  PT Education - 12/28/19 1119    Education Details review of HEP, continuing HEP, exercise mechanics    Person(s) Educated Patient    Methods Explanation;Demonstration    Comprehension Verbalized understanding;Returned demonstration            PT Short Term Goals - 12/09/19 1334      PT SHORT TERM GOAL #1   Title Patient will be independent with HEP in order to improve functional outcomes.    Time 3    Period Weeks    Status On-going    Target Date 12/28/19      PT SHORT TERM GOAL #2   Title Patient will report at least 25% improvement in symptoms for improved quality of life.    Time 6    Period Weeks    Status On-going    Target Date 12/28/19             PT Long Term Goals - 12/09/19 1334      PT LONG TERM GOAL #1   Title Patient will report at least 75% improvement in symptoms for improved quality of life.    Time 6    Period Weeks    Status On-going      PT LONG TERM GOAL #2   Title Patient will improve FOTO score by at least 5 points in order to indicate improved tolerance to activity.    Time 6    Period Weeks    Status On-going      PT LONG TERM GOAL #3   Title Patient will be able to ambulate at least 325 feet in 2MWT in order to demonstrate improved gait speed for community ambulation.    Time 6    Period Weeks    Status On-going      PT LONG TERM GOAL #4   Title Patient will be able to perform sit to stand without UE use in order to demonstrate improved functional strength.    Time 6    Period Weeks    Status On-going                 Plan - 12/28/19 1120    Clinical Impression Statement Patient able to achieve TKE bilaterally with hamstring stretch  today. Also, less discomfort in calves with stretching  today. Patient able to complete increased resistance with moderate difficulty today but fatigues quickly. She has L hip pain with hip abduction which decreases with cueing to decrease ROM. Patient able to complete step ups without UE support today showing improving strength and functional mobility. Patient shows good squatting mechanics and is able to complete without UE support. Patient showing improving activity tolerance and endurance and is able to complete more reps than prior sessions. Patient will continue to benefit from skilled physical therapy in order to reduce impairment and improve function.    Personal Factors and Comorbidities Comorbidity 3+;Past/Current Experience;Time since onset of injury/illness/exacerbation;Fitness;Age    Comorbidities Chronic LBP, scolosis, neurological disease, chronic knee pain    Examination-Activity Limitations Bed Mobility;Bend;Locomotion Level;Transfers;Stairs;Stand;Toileting;Squat;Sleep;Dressing;Carry    Examination-Participation Restrictions Cleaning;Community Activity;[employer] Work;Meal Prep;Shop;Volunteer;Laundry    Stability/Clinical Decision Making Stable/Uncomplicated    Rehab Potential Fair    PT Frequency 2x / week    PT Duration 6 weeks    PT Treatment/Interventions ADLs/Self Care Home Management;Aquatic Therapy;Cryotherapy;Electrical Stimulation;Biofeedback;Moist Heat;Traction;Ultrasound;Parrafin;Fluidtherapy;Contrast Bath;DME Instruction;Gait training;Functional mobility training;Stair training;Therapeutic activities;Therapeutic exercise;Balance training;Neuromuscular re-education;Patient/family education;Orthotic Fit/Training;Manual techniques;Manual lymph drainage;Compression bandaging;Scar mobilization;Passive range of motion;Dry needling;Energy conservation;Splinting;Taping;Vasopneumatic Device;Vestibular;Spinal Manipulations;Joint Manipulations    PT Next Visit Plan continue and progress LE  strengthing and bilateral knee ROM/mobility as tolerated, possibly perform manual techniques for pain relief/mobility    PT Home Exercise Plan 6/15 LAQ, quad sets; added heelraise, squat and active hamstring stretch 6/29 clams           Patient will benefit from skilled therapeutic intervention in order to improve the following deficits and impairments:  Abnormal gait, Decreased range of motion, Difficulty walking, Decreased endurance, Increased muscle spasms, Decreased activity tolerance, Decreased knowledge of use of DME, Decreased balance, Pain, Decreased mobility, Decreased strength, Improper body mechanics, Impaired flexibility, Hypomobility  Visit Diagnosis: Right knee pain, unspecified chronicity  Left knee pain, unspecified chronicity  Muscle weakness (generalized)  Other abnormalities of gait and mobility  Other symptoms and signs involving the musculoskeletal system  Stiffness of right knee, not elsewhere classified  Stiffness of left knee, not elsewhere classified     Problem List Patient Active Problem List   Diagnosis Date Noted  . Acute GI bleed due to bleeding pyloric/gastric ulcer 05/15/2019  . PUD (peptic ulcer disease)-EGD 05/15/2019 with bleeding pyloric/gastric ulcer, nonbleeding duodenal ulcers and gastritis   . Hematemesis with nausea   . Migraine headache 09/25/2015  . 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 Bilateral occipital neuralgia 01/02/2015  . Migraine 01/02/2015  . 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 DDD (degenerative disc disease), cervical 10/27/2014  . 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 DDD (degenerative disc disease), lumbosacral 10/27/2014  . Partial tear subscapularis tendon 12/27/2011    11:57 AM, 12/28/19 Mearl Latin PT, DPT Physical Therapist at [facility] [place], [place], [address] Phone:  [phone]   Fax:  [phone]  Name: Sharon Welch MRN: [id number] Date of Birth: [birth year]

## 2019-12-29 ENCOUNTER — Ambulatory Visit (HOSPITAL_COMMUNITY): Payer: Medicare Other | Admitting: Physical Therapy

## 2019-12-29 ENCOUNTER — Encounter (HOSPITAL_COMMUNITY): Payer: Self-pay | Admitting: Physical Therapy

## 2019-12-29 DIAGNOSIS — M6281 Muscle weakness (generalized): Secondary | ICD-10-CM | POA: Diagnosis not present

## 2019-12-29 DIAGNOSIS — M25561 Pain in right knee: Secondary | ICD-10-CM | POA: Diagnosis not present

## 2019-12-29 DIAGNOSIS — M25661 Stiffness of right knee, not elsewhere classified: Secondary | ICD-10-CM

## 2019-12-29 DIAGNOSIS — R2689 Other abnormalities of gait and mobility: Secondary | ICD-10-CM

## 2019-12-29 DIAGNOSIS — M25662 Stiffness of left knee, not elsewhere classified: Secondary | ICD-10-CM

## 2019-12-29 DIAGNOSIS — M25562 Pain in left knee: Secondary | ICD-10-CM | POA: Diagnosis not present

## 2019-12-29 DIAGNOSIS — R29898 Other symptoms and signs involving the musculoskeletal system: Secondary | ICD-10-CM | POA: Diagnosis not present

## 2019-12-29 NOTE — Therapy (Signed)
[place], [place], [address] Phone: [phone]   Fax:  [phone]  Physical Therapy Treatment  Patient Details  Name: Sharon Welch MRN: [id number] Date of Birth: [birth year] Referring Provider (PT): Arther Abbott MD   Encounter Date: 12/29/2019   PT End of Session - 12/29/19 1438    Visit Number 8    Number of Visits 12    Date for PT Re-Evaluation 01/18/20    Authorization Type Primary: medicare Secondary :medicaid of [place]    Progress Note Due on Visit 10    PT Start Time 1435    PT Stop Time 1515    PT Time Calculation (min) 40 min    Activity Tolerance Patient tolerated treatment well    Behavior During Therapy [facility] for tasks assessed/performed           Past Medical History:  Diagnosis Date  . Bruising, spontaneous 12/08/2013  . Chronic back pain   . Chronic back pain   . DDD (degenerative disc disease), lumbar   . DDD (degenerative disc disease), lumbar   . Fibromyalgia   . Headache(784.0)   . Ovarian cyst   . Partial tear subscapularis tendon 12/27/2011  . Scoliosis   . Seizures ([place])    started 9/12-    Past Surgical History:  Procedure Laterality Date  . ABDOMINAL HYSTERECTOMY  2012  . BACK SURGERY  1989   scoliosis throsic-rods  . CESAREAN SECTION    . CHOLECYSTECTOMY N/A 11/17/2012   Procedure: LAPAROSCOPIC CHOLECYSTECTOMY WITH INTRAOPERATIVE CHOLANGIOGRAM;  Surgeon: Ralene Ok, MD;  Location: [facility];  Service: General;  Laterality: N/A;  . ENDOSCOPIC RETROGRADE CHOLANGIOPANCREATOGRAPHY (ERCP) WITH PROPOFOL N/A 06/11/2019   Procedure: ENDOSCOPIC RETROGRADE CHOLANGIOPANCREATOGRAPHY (ERCP) WITH PROPOFOL;  Surgeon: Carol Ada, MD;  [facility];  Service: Endoscopy;  Laterality: N/A;  . ERCP N/A 05/07/2013   Procedure: ENDOSCOPIC RETROGRADE CHOLANGIOPANCREATOGRAPHY (ERCP);  Surgeon: Beryle Beams, MD;  Location: [facility];  Service: Endoscopy;  Laterality: N/A;  start ercp  first, if canulation fails switch to EUS   . ESOPHAGOGASTRODUODENOSCOPY (EGD) WITH PROPOFOL N/A 05/15/2019   Procedure: ESOPHAGOGASTRODUODENOSCOPY (EGD) WITH PROPOFOL;  Surgeon: Danie Binder, MD;  [facility];  Service: Endoscopy;  Laterality: N/A;  . EUS N/A 11/03/2012   Procedure: UPPER ENDOSCOPIC ULTRASOUND (EUS) LINEAR;  Surgeon: Beryle Beams, MD;  [facility];  Service: Endoscopy;  Laterality: N/A;  . left arm    . NECK SURGERY    . REMOVAL OF STONES  06/11/2019   Procedure: REMOVAL OF STONES;  Surgeon: Carol Ada, MD;  [facility];  Service: Endoscopy;;  . TUBAL LIGATION    . UPPER ESOPHAGEAL ENDOSCOPIC ULTRASOUND (EUS) N/A 06/11/2019   Procedure: UPPER ESOPHAGEAL ENDOSCOPIC ULTRASOUND (EUS);  Surgeon: Carol Ada, MD;  Location: [facility];  Service: Endoscopy;  Laterality: N/A;    There were no vitals filed for this visit.   Subjective Assessment - 12/29/19 1434    Subjective Patient states she was tired after last session and went home and slept the rest of the day. Her knees are aright today. Her back was bothering her yesterday.    Limitations House hold activities;Standing;Walking;Other (comment)    How long can you walk comfortably? 30 minutes    Patient Stated Goals to work her leg so it's not painful and keep her knee from locking up, and avoid surgery at all costs    Currently in Pain? Yes  Pain Score 3     Pain Location Knee    Pain Onset More than a month ago                             [facility] Adult PT Treatment/Exercise - 12/29/19 0001      Knee/Hip Exercises: Stretches   Active Hamstring Stretch Both;3 reps;30 seconds    Gastroc Stretch Limitations 3x30 second holds on slant board      Knee/Hip Exercises: Machines for Strengthening   Other Machine leg press 2x8 20#      Knee/Hip Exercises: Standing   Knee Flexion Both;15 reps;2 sets    Knee Flexion Limitations 4#    Terminal Knee Extension  Both;5 reps    Terminal Knee Extension Limitations purple band 10 second holds bilateral    Hip Abduction Both;2 sets;10 reps    Abduction Limitations 4#    Hip Extension Both;2 sets;10 reps    Extension Limitations 4#    Lateral Step Up Both;10 reps;Step Height: 6";2 sets;Step Height: 4"    Lateral Step Up Limitations 2nd  set on 4 inch step with eccentric control with no UE support    Forward Step Up Both;10 reps;Step Height: 6";Hand Hold: Production manager Board 2 minutes                  PT Education - 12/29/19 1438    Education Details review of HEP, continuing HEP, exercise mechanics    Person(s) Educated Patient    Methods Explanation;Demonstration    Comprehension Verbalized understanding;Returned demonstration            PT Short Term Goals - 12/09/19 1334      PT SHORT TERM GOAL #1   Title Patient will be independent with HEP in order to improve functional outcomes.    Time 3    Period Weeks    Status On-going    Target Date 12/28/19      PT SHORT TERM GOAL #2   Title Patient will report at least 25% improvement in symptoms for improved quality of life.    Time 6    Period Weeks    Status On-going    Target Date 12/28/19             PT Long Term Goals - 12/09/19 1334      PT LONG TERM GOAL #1   Title Patient will report at least 75% improvement in symptoms for improved quality of life.    Time 6    Period Weeks    Status On-going      PT LONG TERM GOAL #2   Title Patient will improve FOTO score by at least 5 points in order to indicate improved tolerance to activity.    Time 6    Period Weeks    Status On-going      PT LONG TERM GOAL #3   Title Patient will be able to ambulate at least 325 feet in 2MWT in order to demonstrate improved gait speed for community ambulation.    Time 6    Period Weeks    Status On-going      PT LONG TERM GOAL #4   Title Patient will be able to perform sit to stand without UE use in order to demonstrate  improved functional strength.    Time 6    Period Weeks    Status On-going  Plan - 12/29/19 1439    Clinical Impression Statement Patient demonstrating improving bilateral knee ROM with exercises today. She is able to complete stretches with min/no verbal cueing but has slight increase in back pain with hamstring stretch on LLE. Patient showing improving bilateral LE strength as well with improving activity tolerance. Patient continues to remain highly motivated to improve function. No increase in weights/reps today since patient just did new resistance levels yesterday. Patient able to complete lateral step down with eccentric control with good mechanics and no UE support but requires some verbal cueing and demonstration to limit dynamic knee valgus. Patient fatigues quickly with leg press exercise but does show good control. Patient will continue to benefit from skilled physical therapy in order to reduce impairment and improve function.    Personal Factors and Comorbidities Comorbidity 3+;Past/Current Experience;Time since onset of injury/illness/exacerbation;Fitness;Age    Comorbidities Chronic LBP, scolosis, neurological disease, chronic knee pain    Examination-Activity Limitations Bed Mobility;Bend;Locomotion Level;Transfers;Stairs;Stand;Toileting;Squat;Sleep;Dressing;Carry    Examination-Participation Restrictions Cleaning;Community Activity;[employer] Work;Meal Prep;Shop;Volunteer;Laundry    Stability/Clinical Decision Making Stable/Uncomplicated    Rehab Potential Fair    PT Frequency 2x / week    PT Duration 6 weeks    PT Treatment/Interventions ADLs/Self Care Home Management;Aquatic Therapy;Cryotherapy;Electrical Stimulation;Biofeedback;Moist Heat;Traction;Ultrasound;Parrafin;Fluidtherapy;Contrast Bath;DME Instruction;Gait training;Functional mobility training;Stair training;Therapeutic activities;Therapeutic exercise;Balance training;Neuromuscular  re-education;Patient/family education;Orthotic Fit/Training;Manual techniques;Manual lymph drainage;Compression bandaging;Scar mobilization;Passive range of motion;Dry needling;Energy conservation;Splinting;Taping;Vasopneumatic Device;Vestibular;Spinal Manipulations;Joint Manipulations    PT Next Visit Plan continue and progress LE strengthing and bilateral knee ROM/mobility as tolerated, possibly perform manual techniques for pain relief/mobility    PT Home Exercise Plan 6/15 LAQ, quad sets; added heelraise, squat and active hamstring stretch 6/29 clams           Patient will benefit from skilled therapeutic intervention in order to improve the following deficits and impairments:  Abnormal gait, Decreased range of motion, Difficulty walking, Decreased endurance, Increased muscle spasms, Decreased activity tolerance, Decreased knowledge of use of DME, Decreased balance, Pain, Decreased mobility, Decreased strength, Improper body mechanics, Impaired flexibility, Hypomobility  Visit Diagnosis: Right knee pain, unspecified chronicity  Left knee pain, unspecified chronicity  Muscle weakness (generalized)  Other abnormalities of gait and mobility  Other symptoms and signs involving the musculoskeletal system  Stiffness of right knee, not elsewhere classified  Stiffness of left knee, not elsewhere classified     Problem List Patient Active Problem List   Diagnosis Date Noted  . Acute GI bleed due to bleeding pyloric/gastric ulcer 05/15/2019  . PUD (peptic ulcer disease)-EGD 05/15/2019 with bleeding pyloric/gastric ulcer, nonbleeding duodenal ulcers and gastritis   . Hematemesis with nausea   . Migraine headache 09/25/2015  . 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 Bilateral occipital neuralgia 01/02/2015  . Migraine 01/02/2015  . 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 DDD (degenerative disc disease), cervical 10/27/2014  . DDD (degenerative disc disease), thoracic  10/27/2014  . DDD (degenerative disc disease), lumbosacral 10/27/2014  . Partial tear subscapularis tendon 12/27/2011    3:15 PM, 12/29/19 Mearl Latin PT, DPT Physical Therapist at [place], [place], [address] Phone: [phone]   Fax:  [phone]  Name: Sharon Welch MRN: [id number] Date of Birth: 07-04-1970

## 2020-01-03 ENCOUNTER — Telehealth: Payer: Self-pay | Admitting: Orthopedic Surgery

## 2020-01-03 DIAGNOSIS — G894 Chronic pain syndrome: Secondary | ICD-10-CM | POA: Diagnosis not present

## 2020-01-03 DIAGNOSIS — M419 Scoliosis, unspecified: Secondary | ICD-10-CM | POA: Diagnosis not present

## 2020-01-03 DIAGNOSIS — M5030 Other cervical disc degeneration, unspecified cervical region: Secondary | ICD-10-CM | POA: Diagnosis not present

## 2020-01-03 NOTE — Telephone Encounter (Signed)
Patient called and wanted to know if Dr Aline Brochure would refer her to physical therapy for her back pain.  She has a history of back pain and surgery.  I told her that since Dr. Aline Brochure has not ever seen her for her back, he would not refer for PT without an evaluation.   She said she understood.  I told her that she could talk to her pain clinic doctor or possibly her PCP.  She said she would do this.

## 2020-01-04 ENCOUNTER — Encounter (HOSPITAL_COMMUNITY): Payer: Self-pay | Admitting: Physical Therapy

## 2020-01-04 ENCOUNTER — Other Ambulatory Visit: Payer: Self-pay

## 2020-01-04 ENCOUNTER — Ambulatory Visit (HOSPITAL_COMMUNITY): Payer: Medicare Other | Admitting: Physical Therapy

## 2020-01-04 DIAGNOSIS — R2689 Other abnormalities of gait and mobility: Secondary | ICD-10-CM | POA: Diagnosis not present

## 2020-01-04 DIAGNOSIS — M25661 Stiffness of right knee, not elsewhere classified: Secondary | ICD-10-CM | POA: Diagnosis not present

## 2020-01-04 DIAGNOSIS — R29898 Other symptoms and signs involving the musculoskeletal system: Secondary | ICD-10-CM | POA: Diagnosis not present

## 2020-01-04 DIAGNOSIS — M6281 Muscle weakness (generalized): Secondary | ICD-10-CM

## 2020-01-04 DIAGNOSIS — M25561 Pain in right knee: Secondary | ICD-10-CM | POA: Diagnosis not present

## 2020-01-04 DIAGNOSIS — M25562 Pain in left knee: Secondary | ICD-10-CM | POA: Diagnosis not present

## 2020-01-04 DIAGNOSIS — M25662 Stiffness of left knee, not elsewhere classified: Secondary | ICD-10-CM

## 2020-01-04 NOTE — Therapy (Signed)
[place], [place], [address] Phone: [phone]   Fax:  [phone]  Physical Therapy Treatment  Patient Details  Name: Sharon Welch MRN: [id number] Date of Birth: 05/30/1971 Referring Provider (PT): Arther Abbott MD   Encounter Date: 01/04/2020   PT End of Session - 01/04/20 1524    Visit Number 9    Number of Visits 12    Date for PT Re-Evaluation 01/18/20    Authorization Type Primary: medicare Secondary :medicaid of [place]    Progress Note Due on Visit 10    PT Start Time 1520    PT Stop Time 1600    PT Time Calculation (min) 40 min    Activity Tolerance Patient tolerated treatment well    Behavior During Therapy [facility] for tasks assessed/performed           Past Medical History:  Diagnosis Date  . Bruising, spontaneous 12/08/2013  . Chronic back pain   . Chronic back pain   . DDD (degenerative disc disease), lumbar   . DDD (degenerative disc disease), lumbar   . Fibromyalgia   . Headache(784.0)   . Ovarian cyst   . Partial tear subscapularis tendon 12/27/2011  . Scoliosis   . Seizures ([place])    started 9/12-    Past Surgical History:  Procedure Laterality Date  . ABDOMINAL HYSTERECTOMY  2012  . BACK SURGERY  1989   scoliosis throsic-rods  . CESAREAN SECTION    . CHOLECYSTECTOMY N/A 11/17/2012   Procedure: LAPAROSCOPIC CHOLECYSTECTOMY WITH INTRAOPERATIVE CHOLANGIOGRAM;  Surgeon: Ralene Ok, MD;  Location: [facility];  Service: General;  Laterality: N/A;  . ENDOSCOPIC RETROGRADE CHOLANGIOPANCREATOGRAPHY (ERCP) WITH PROPOFOL N/A 06/11/2019   Procedure: ENDOSCOPIC RETROGRADE CHOLANGIOPANCREATOGRAPHY (ERCP) WITH PROPOFOL;  Surgeon: Carol Ada, MD;  [facility];  Service: Endoscopy;  Laterality: N/A;  . ERCP N/A 05/07/2013   Procedure: ENDOSCOPIC RETROGRADE CHOLANGIOPANCREATOGRAPHY (ERCP);  Surgeon: Beryle Beams, MD;  Location: [facility];  Service: Endoscopy;  Laterality: N/A;  start  ercp first, if canulation fails switch to EUS   . ESOPHAGOGASTRODUODENOSCOPY (EGD) WITH PROPOFOL N/A 05/15/2019   Procedure: ESOPHAGOGASTRODUODENOSCOPY (EGD) WITH PROPOFOL;  Surgeon: Danie Binder, MD;  [facility];  Service: Endoscopy;  Laterality: N/A;  . EUS N/A 11/03/2012   Procedure: UPPER ENDOSCOPIC ULTRASOUND (EUS) LINEAR;  Surgeon: Beryle Beams, MD;  [facility];  Service: Endoscopy;  Laterality: N/A;  . left arm    . NECK SURGERY    . REMOVAL OF STONES  06/11/2019   Procedure: REMOVAL OF STONES;  Surgeon: Carol Ada, MD;  [facility];  Service: Endoscopy;;  . TUBAL LIGATION    . UPPER ESOPHAGEAL ENDOSCOPIC ULTRASOUND (EUS) N/A 06/11/2019   Procedure: UPPER ESOPHAGEAL ENDOSCOPIC ULTRASOUND (EUS);  Surgeon: Carol Ada, MD;  Location: [facility];  Service: Endoscopy;  Laterality: N/A;    There were no vitals filed for this visit.   Subjective Assessment - 01/04/20 1523    Subjective Patient says things have been going pretty good and notes that she has had a lot of relief so far. "Not having near as much trouble with my knees as I was".    Limitations House hold activities;Standing;Walking;Other (comment)    How long can you walk comfortably? 30 minutes    Patient Stated Goals to work her leg so it's not painful and keep her knee from locking up, and avoid surgery at all costs    Currently in Pain?  Yes    Pain Score 3     Pain Location Knee    Pain Orientation Right;Left    Pain Descriptors / Indicators Aching    Pain Type Chronic pain    Pain Onset More than a month ago    Pain Frequency Constant                             [facility] Adult PT Treatment/Exercise - 01/04/20 0001      Knee/Hip Exercises: Stretches   Active Hamstring Stretch Both;3 reps;30 seconds    Gastroc Stretch Limitations 3x30 second holds on slant board      Knee/Hip Exercises: Standing   Terminal Knee Extension Both;10 reps    Terminal Knee  Extension Limitations purple band 10 second holds bilateral    Hip Abduction Both;2 sets;10 reps    Abduction Limitations GTB    Hip Extension Both;2 sets;10 reps    Extension Limitations GTB    Lateral Step Up Both;10 reps;2 sets;Step Height: 4"    Lateral Step Up Limitations 2nd  set on 4 inch step with eccentric control with no UE support    Forward Step Up Both;10 reps;Step Height: 6";Hand Hold: 0;2 sets    Rocker Board 2 minutes    SLS 3 x 15" solid floor                     PT Short Term Goals - 12/09/19 1334      PT SHORT TERM GOAL #1   Title Patient will be independent with HEP in order to improve functional outcomes.    Time 3    Period Weeks    Status On-going    Target Date 12/28/19      PT SHORT TERM GOAL #2   Title Patient will report at least 25% improvement in symptoms for improved quality of life.    Time 6    Period Weeks    Status On-going    Target Date 12/28/19             PT Long Term Goals - 12/09/19 1334      PT LONG TERM GOAL #1   Title Patient will report at least 75% improvement in symptoms for improved quality of life.    Time 6    Period Weeks    Status On-going      PT LONG TERM GOAL #2   Title Patient will improve FOTO score by at least 5 points in order to indicate improved tolerance to activity.    Time 6    Period Weeks    Status On-going      PT LONG TERM GOAL #3   Title Patient will be able to ambulate at least 325 feet in 2MWT in order to demonstrate improved gait speed for community ambulation.    Time 6    Period Weeks    Status On-going      PT LONG TERM GOAL #4   Title Patient will be able to perform sit to stand without UE use in order to demonstrate improved functional strength.    Time 6    Period Weeks    Status On-going                 Plan - 01/04/20 1759    Clinical Impression Statement Patient with noted fatigue in LT quad during lateral heel tap downs from 4-inch step. Activity graded  per  patient tolerance. Added SLS on solid surface for improved balance and single leg stabilization. Patient did well with this. Added band hip abduction and extension for adding exercise to patient HEP. Patient reports no increased pain end of session but does note increased muscle fatigue. Patient instructed to hold on addition of green band to HEP until response assessed at next visit. Patient verbalized agreement.    Personal Factors and Comorbidities Comorbidity 3+;Past/Current Experience;Time since onset of injury/illness/exacerbation;Fitness;Age    Comorbidities Chronic LBP, scolosis, neurological disease, chronic knee pain    Examination-Activity Limitations Bed Mobility;Bend;Locomotion Level;Transfers;Stairs;Stand;Toileting;Squat;Sleep;Dressing;Carry    Examination-Participation Restrictions Cleaning;Community Activity;V[employer] Work;Meal Prep;Shop;Volunteer;Laundry    Stability/Clinical Decision Making Stable/Uncomplicated    Rehab Potential Fair    PT Frequency 2x / week    PT Duration 6 weeks    PT Treatment/Interventions ADLs/Self Care Home Management;Aquatic Therapy;Cryotherapy;Electrical Stimulation;Biofeedback;Moist Heat;Traction;Ultrasound;Parrafin;Fluidtherapy;Contrast Bath;DME Instruction;Gait training;Functional mobility training;Stair training;Therapeutic activities;Therapeutic exercise;Balance training;Neuromuscular re-education;Patient/family education;Orthotic Fit/Training;Manual techniques;Manual lymph drainage;Compression bandaging;Scar mobilization;Passive range of motion;Dry needling;Energy conservation;Splinting;Taping;Vasopneumatic Device;Vestibular;Spinal Manipulations;Joint Manipulations    PT Next Visit Plan Assess response to added activity last sesssion. Add band strengthening to HEP if tolerated. continue and progress LE strengthing and bilateral knee ROM/mobility as tolerated, possibly perform manual techniques for pain relief/mobility    PT Home Exercise Plan 6/15 LAQ, quad  sets; added heelraise, squat and active hamstring stretch 6/29 clams           Patient will benefit from skilled therapeutic intervention in order to improve the following deficits and impairments:  Abnormal gait, Decreased range of motion, Difficulty walking, Decreased endurance, Increased muscle spasms, Decreased activity tolerance, Decreased knowledge of use of DME, Decreased balance, Pain, Decreased mobility, Decreased strength, Improper body mechanics, Impaired flexibility, Hypomobility  Visit Diagnosis: Right knee pain, unspecified chronicity  Left knee pain, unspecified chronicity  Muscle weakness (generalized)  Other abnormalities of gait and mobility  Other symptoms and signs involving the musculoskeletal system  Stiffness of right knee, not elsewhere classified  Stiffness of left knee, not elsewhere classified     Problem List Patient Active Problem List   Diagnosis Date Noted  . Acute GI bleed due to bleeding pyloric/gastric ulcer 05/15/2019  . PUD (peptic ulcer disease)-EGD 05/15/2019 with bleeding pyloric/gastric ulcer, nonbleeding duodenal ulcers and gastritis   . Hematemesis with nausea   . Migraine headache 09/25/2015  . 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 Bilateral occipital neuralgia 01/02/2015  . Migraine 01/02/2015  . 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 DDD (degenerative disc disease), cervical 10/27/2014  . 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 DDD (degenerative disc disease), lumbosacral 10/27/2014  . Partial tear subscapularis tendon 12/27/2011    6:04 PM, 01/04/20 Josue Hector PT DPT  Physical Therapist with [facility]  [phone] [place] [address] [place], [place], [address] Phone: [phone]   Fax:  [phone]  Name: Kaylise Blakeley Baquero MRN: [id number] Date of Birth: May 13, 1971

## 2020-01-06 ENCOUNTER — Other Ambulatory Visit: Payer: Self-pay

## 2020-01-06 ENCOUNTER — Ambulatory Visit (HOSPITAL_COMMUNITY): Payer: Medicare Other | Admitting: Physical Therapy

## 2020-01-06 ENCOUNTER — Encounter (HOSPITAL_COMMUNITY): Payer: Self-pay | Admitting: Physical Therapy

## 2020-01-06 DIAGNOSIS — M25562 Pain in left knee: Secondary | ICD-10-CM

## 2020-01-06 DIAGNOSIS — M25561 Pain in right knee: Secondary | ICD-10-CM

## 2020-01-06 DIAGNOSIS — M25661 Stiffness of right knee, not elsewhere classified: Secondary | ICD-10-CM | POA: Diagnosis not present

## 2020-01-06 DIAGNOSIS — R29898 Other symptoms and signs involving the musculoskeletal system: Secondary | ICD-10-CM | POA: Diagnosis not present

## 2020-01-06 DIAGNOSIS — R2689 Other abnormalities of gait and mobility: Secondary | ICD-10-CM | POA: Diagnosis not present

## 2020-01-06 DIAGNOSIS — M6281 Muscle weakness (generalized): Secondary | ICD-10-CM

## 2020-01-06 NOTE — Therapy (Signed)
[facility] [address] [place], [place], [address] Phone: [phone]   Fax:  [phone]  Physical Therapy Treatment, Progress Note  Patient Details  Name: Sharon Welch MRN: [id number] Date of Birth: Mar 01, 1971 Referring Provider (PT): Arther Abbott MD  Progress Note Reporting Period 12/07/19 to 01/06/20   See note below for Objective Data and Assessment of Progress/Goals.       Encounter Date: 01/06/2020   PT End of Session - 01/06/20 1531    Visit Number 10    Number of Visits 12    Date for PT Re-Evaluation 01/18/20    Authorization Type Primary: medicare Secondary :medicaid of [place]    Progress Note Due on Visit 10    PT Start Time 1532    PT Stop Time 1612    PT Time Calculation (min) 40 min    Activity Tolerance Patient tolerated treatment well    Behavior During Therapy WFL for tasks assessed/performed           Past Medical History:  Diagnosis Date  . Bruising, spontaneous 12/08/2013  . Chronic back pain   . Chronic back pain   . DDD (degenerative disc disease), lumbar   . DDD (degenerative disc disease), lumbar   . Fibromyalgia   . Headache(784.0)   . Ovarian cyst   . Partial tear subscapularis tendon 12/27/2011  . Scoliosis   . Seizures ([place])    started 9/12-    Past Surgical History:  Procedure Laterality Date  . ABDOMINAL HYSTERECTOMY  2012  . BACK SURGERY  1989   scoliosis throsic-rods  . CESAREAN SECTION    . CHOLECYSTECTOMY N/A 11/17/2012   Procedure: LAPAROSCOPIC CHOLECYSTECTOMY WITH INTRAOPERATIVE CHOLANGIOGRAM;  Surgeon: Ralene Ok, MD;  Location: [facility];  Service: General;  Laterality: N/A;  . ENDOSCOPIC RETROGRADE CHOLANGIOPANCREATOGRAPHY (ERCP) WITH PROPOFOL N/A 06/11/2019   Procedure: ENDOSCOPIC RETROGRADE CHOLANGIOPANCREATOGRAPHY (ERCP) WITH PROPOFOL;  Surgeon: Carol Ada, MD;  [facility];  Service: Endoscopy;  Laterality: N/A;  . ERCP N/A 05/07/2013   Procedure: ENDOSCOPIC  RETROGRADE CHOLANGIOPANCREATOGRAPHY (ERCP);  Surgeon: Beryle Beams, MD;  Location: [facility];  Service: Endoscopy;  Laterality: N/A;  start ercp first, if canulation fails switch to EUS   . ESOPHAGOGASTRODUODENOSCOPY (EGD) WITH PROPOFOL N/A 05/15/2019   Procedure: ESOPHAGOGASTRODUODENOSCOPY (EGD) WITH PROPOFOL;  Surgeon: Danie Binder, MD;  [facility];  Service: Endoscopy;  Laterality: N/A;  . EUS N/A 11/03/2012   Procedure: UPPER ENDOSCOPIC ULTRASOUND (EUS) LINEAR;  Surgeon: Beryle Beams, MD;  [facility];  Service: Endoscopy;  Laterality: N/A;  . left arm    . NECK SURGERY    . REMOVAL OF STONES  06/11/2019   Procedure: REMOVAL OF STONES;  Surgeon: Carol Ada, MD;  Location: [facility];  Service: Endoscopy;;  . TUBAL LIGATION    . UPPER ESOPHAGEAL ENDOSCOPIC ULTRASOUND (EUS) N/A 06/11/2019   Procedure: UPPER ESOPHAGEAL ENDOSCOPIC ULTRASOUND (EUS);  Surgeon: Carol Ada, MD;  Location: [facility];  Service: Endoscopy;  Laterality: N/A;    There were no vitals filed for this visit.   Subjective Assessment - 01/06/20 1534    Subjective States she has a little pain today 3/10 in both knees. States she also has back pain but that's always there. States she feels about 75% better since the start therapy. States that she has been doing all her exercises everyday.    Limitations House hold activities;Standing;Walking;Other (comment)    How long can you walk comfortably?  30 minutes    Patient Stated Goals to work her leg so it's not painful and keep her knee from locking up, and avoid surgery at all costs    Currently in Pain? Yes    Pain Location Knee    Pain Orientation Left;Right    Pain Descriptors / Indicators Aching    Pain Onset More than a month ago              [facility] PT Assessment - 01/06/20 0001      Assessment   Medical Diagnosis Chronic bilateral knee pain    Referring Provider (PT) Arther Abbott MD    Onset Date/Surgical Date  12/07/18    Next MD Visit None scheduled       AROM   Right Knee Extension 0   can do one leg at a time for full extension   Right Knee Flexion 124    Left Knee Extension 0   can do one leg at a time for full extension   Left Knee Flexion 156      Strength   Overall Strength Comments no reports of pain with testing     Strength Assessment Site Hip;Knee;Ankle    Right/Left Hip Right;Left    Right Hip Flexion 4/5    Left Hip Flexion 4-/5    Right Knee Flexion 4-/5    Right Knee Extension 4/5    Left Knee Flexion 4-/5    Left Knee Extension 4/5    Right Ankle Dorsiflexion 4+/5    Left Ankle Dorsiflexion 4+/5      Transfers   Comments able to perform without hands and with ease      Ambulation/Gait   Ambulation/Gait Yes    Ambulation/Gait Assistance 7: Independent    Ambulation Distance (Feet) 538 Feet    Assistive device None    Gait Pattern Decreased hip/knee flexion - right;Decreased hip/knee flexion - left;Right flexed knee in stance;Left flexed knee in stance;Antalgic    Ambulation Surface Level;Indoor                         [facility] Adult PT Treatment/Exercise - 01/06/20 0001      Ambulation/Gait   Stairs Yes    Stairs Assistance 6: Modified independent (Device/Increase time)    Stair Management Technique One rail Left    Number of Stairs 7    Height of Stairs 4    Gait Comments 2MW; 4x5 sets of stairs      Knee/Hip Exercises: Standing   Stairs 3x5 at 7" step with one railing and 4 steps                   PT Education - 01/06/20 1602    Education Details educated patient in progress, in [place], reviewed HEP and discussed plan moving forward.    Person(s) Educated Patient    Methods Explanation    Comprehension Verbalized understanding            PT Short Term Goals - 01/06/20 1612      PT SHORT TERM GOAL #1   Title Patient will be independent with HEP in order to improve functional outcomes.    Time 3    Period Weeks    Status  Achieved    Target Date 12/28/19      PT SHORT TERM GOAL #2   Title Patient will report at least 25% improvement in symptoms for improved quality of life.  Time 6    Period Weeks    Status Achieved    Target Date 12/28/19             PT Long Term Goals - 01/06/20 1551      PT LONG TERM GOAL #1   Title Patient will report at least 75% improvement in symptoms for improved quality of life.    Baseline 75% limited    Time 6    Period Weeks    Status Achieved      PT LONG TERM GOAL #2   Title Patient will improve FOTO score by at least 5 points in order to indicate improved tolerance to activity.    Time 6    Period Weeks    Status Partially Met      PT LONG TERM GOAL #3   Title Patient will be able to ambulate at least 325 feet in 2MWT in order to demonstrate improved gait speed for community ambulation.    Time 6    Period Weeks    Status Achieved      PT LONG TERM GOAL #4   Title Patient will be able to perform sit to stand without UE use in order to demonstrate improved functional strength.    Time 6    Period Weeks    Status Achieved                 Plan - 01/06/20 1607    Clinical Impression Statement Patient present for progress note on this date. She has made great progress and has met all short term goals and all but one long term goal. Discussed progress and plan moving forward. Patient would like to focus on continued LE strengthening with focus on being able to go up and down stairs as she needs to help care for her mother in law. Anticipate discharge at end of current plan of care pending patient presentation.    Personal Factors and Comorbidities Comorbidity 3+;Past/Current Experience;Time since onset of injury/illness/exacerbation;Fitness;Age    Comorbidities Chronic LBP, scolosis, neurological disease, chronic knee pain    Examination-Activity Limitations Bed Mobility;Bend;Locomotion Level;Transfers;Stairs;Stand;Toileting;Squat;Sleep;Dressing;Carry     Examination-Participation Restrictions Cleaning;Community Activity;[employer] Work;Meal Prep;Shop;Volunteer;Laundry    Stability/Clinical Decision Making Stable/Uncomplicated    Rehab Potential Fair    PT Frequency 2x / week    PT Duration 6 weeks    PT Treatment/Interventions ADLs/Self Care Home Management;Aquatic Therapy;Cryotherapy;Electrical Stimulation;Biofeedback;Moist Heat;Traction;Ultrasound;Parrafin;Fluidtherapy;Contrast Bath;DME Instruction;Gait training;Functional mobility training;Stair training;Therapeutic activities;Therapeutic exercise;Balance training;Neuromuscular re-education;Patient/family education;Orthotic Fit/Training;Manual techniques;Manual lymph drainage;Compression bandaging;Scar mobilization;Passive range of motion;Dry needling;Energy conservation;Splinting;Taping;Vasopneumatic Device;Vestibular;Spinal Manipulations;Joint Manipulations    PT Next Visit Plan continue with LE strengthening with focus on stair strengthening.    PT Home Exercise Plan 6/15 LAQ, quad sets; added heelraise, squat and active hamstring stretch 6/29 clams           Patient will benefit from skilled therapeutic intervention in order to improve the following deficits and impairments:  Abnormal gait, Decreased range of motion, Difficulty walking, Decreased endurance, Increased muscle spasms, Decreased activity tolerance, Decreased knowledge of use of DME, Decreased balance, Pain, Decreased mobility, Decreased strength, Improper body mechanics, Impaired flexibility, Hypomobility  Visit Diagnosis: Right knee pain, unspecified chronicity  Left knee pain, unspecified chronicity  Muscle weakness (generalized)     Problem List Patient Active Problem List   Diagnosis Date Noted  . Acute GI bleed due to bleeding pyloric/gastric ulcer 05/15/2019  . PUD (peptic ulcer disease)-EGD 05/15/2019 with bleeding pyloric/gastric ulcer, nonbleeding duodenal ulcers and gastritis   .  Hematemesis with nausea   .  Migraine headache 09/25/2015  . 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 Bilateral occipital neuralgia 01/02/2015  . Migraine 01/02/2015  . 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 DDD (degenerative disc disease), cervical 10/27/2014  . 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 DDD (degenerative disc disease), lumbosacral 10/27/2014  . Partial tear subscapularis tendon 12/27/2011    4:16 PM, 01/06/20 Jerene Pitch, DPT Physical Therapy with [facility]  [phone] office  [facility] [address] [place], [place], [address] Phone: [phone]   Fax:  [phone]  Name: Sharon Welch MRN: [id number] Date of Birth: 03/13/1971

## 2020-01-10 DIAGNOSIS — Z1231 Encounter for screening mammogram for malignant neoplasm of breast: Secondary | ICD-10-CM | POA: Diagnosis not present

## 2020-01-11 ENCOUNTER — Encounter (HOSPITAL_COMMUNITY): Payer: Self-pay | Admitting: Physical Therapy

## 2020-01-11 ENCOUNTER — Ambulatory Visit (HOSPITAL_COMMUNITY): Payer: Medicare Other | Admitting: Physical Therapy

## 2020-01-11 ENCOUNTER — Other Ambulatory Visit: Payer: Self-pay

## 2020-01-11 DIAGNOSIS — R2689 Other abnormalities of gait and mobility: Secondary | ICD-10-CM | POA: Diagnosis not present

## 2020-01-11 DIAGNOSIS — M25561 Pain in right knee: Secondary | ICD-10-CM | POA: Diagnosis not present

## 2020-01-11 DIAGNOSIS — M25662 Stiffness of left knee, not elsewhere classified: Secondary | ICD-10-CM

## 2020-01-11 DIAGNOSIS — M25562 Pain in left knee: Secondary | ICD-10-CM | POA: Diagnosis not present

## 2020-01-11 DIAGNOSIS — M25661 Stiffness of right knee, not elsewhere classified: Secondary | ICD-10-CM | POA: Diagnosis not present

## 2020-01-11 DIAGNOSIS — M6281 Muscle weakness (generalized): Secondary | ICD-10-CM | POA: Diagnosis not present

## 2020-01-11 DIAGNOSIS — R29898 Other symptoms and signs involving the musculoskeletal system: Secondary | ICD-10-CM

## 2020-01-11 NOTE — Therapy (Signed)
[facility] [place], [place], [address] Phone: [phone]   Fax:  [phone]  Physical Therapy Treatment  Patient Details  Name: Sharon Welch MRN: [id number] Date of Birth: [birth year] Referring Provider (PT): Arther Abbott MD   Encounter Date: 01/11/2020   PT End of Session - 01/11/20 1517    Visit Number 11    Number of Visits 12    Date for PT Re-Evaluation 01/18/20    Authorization Type Primary: medicare Secondary :medicaid of [place]    Progress Note Due on Visit 20    PT Start Time 1518    PT Stop Time 1600    PT Time Calculation (min) 42 min    Activity Tolerance Patient tolerated treatment well    Behavior During Therapy [facility] for tasks assessed/performed           Past Medical History:  Diagnosis Date  . Bruising, spontaneous 12/08/2013  . Chronic back pain   . Chronic back pain   . DDD (degenerative disc disease), lumbar   . DDD (degenerative disc disease), lumbar   . Fibromyalgia   . Headache(784.0)   . Ovarian cyst   . Partial tear subscapularis tendon 12/27/2011  . Scoliosis   . Seizures ([place])    started 9/12-    Past Surgical History:  Procedure Laterality Date  . ABDOMINAL HYSTERECTOMY  2012  . BACK SURGERY  1989   scoliosis throsic-rods  . CESAREAN SECTION    . CHOLECYSTECTOMY N/A 11/17/2012   Procedure: LAPAROSCOPIC CHOLECYSTECTOMY WITH INTRAOPERATIVE CHOLANGIOGRAM;  Surgeon: Ralene Ok, MD;  Location: [facility];  Service: General;  Laterality: N/A;  . ENDOSCOPIC RETROGRADE CHOLANGIOPANCREATOGRAPHY (ERCP) WITH PROPOFOL N/A 06/11/2019   Procedure: ENDOSCOPIC RETROGRADE CHOLANGIOPANCREATOGRAPHY (ERCP) WITH PROPOFOL;  Surgeon: Carol Ada, MD;  [facility];  Service: Endoscopy;  Laterality: N/A;  . ERCP N/A 05/07/2013   Procedure: ENDOSCOPIC RETROGRADE CHOLANGIOPANCREATOGRAPHY (ERCP);  Surgeon: Beryle Beams, MD;  Location: [facility];  Service: Endoscopy;  Laterality: N/A;  start  ercp first, if canulation fails switch to EUS   . ESOPHAGOGASTRODUODENOSCOPY (EGD) WITH PROPOFOL N/A 05/15/2019   Procedure: ESOPHAGOGASTRODUODENOSCOPY (EGD) WITH PROPOFOL;  Surgeon: Danie Binder, MD;  [facility];  Service: Endoscopy;  Laterality: N/A;  . EUS N/A 11/03/2012   Procedure: UPPER ENDOSCOPIC ULTRASOUND (EUS) LINEAR;  Surgeon: Beryle Beams, MD;  Location: [facility];  Service: Endoscopy;  Laterality: N/A;  . left arm    . NECK SURGERY    . REMOVAL OF STONES  06/11/2019   Procedure: REMOVAL OF STONES;  Surgeon: Carol Ada, MD;  Location: [facility];  Service: Endoscopy;;  . TUBAL LIGATION    . UPPER ESOPHAGEAL ENDOSCOPIC ULTRASOUND (EUS) N/A 06/11/2019   Procedure: UPPER ESOPHAGEAL ENDOSCOPIC ULTRASOUND (EUS);  Surgeon: Carol Ada, MD;  Location: [facility];  Service: Endoscopy;  Laterality: N/A;    There were no vitals filed for this visit.   Subjective Assessment - 01/11/20 1516    Subjective Patient states she is doing good and is ready for Thursday to be her last day. She has been doing her HEP daily.    Limitations House hold activities;Standing;Walking;Other (comment)    How long can you walk comfortably? 30 minutes    Patient Stated Goals to work her leg so it's not painful and keep her knee from locking up, and avoid surgery at all costs    Currently in Pain? Yes    Pain Score 3  Pain Location Knee    Pain Onset More than a month ago                             [facility] Adult PT Treatment/Exercise - 01/11/20 0001      Ambulation/Gait   Stairs Yes    Stairs Assistance 6: Modified independent (Device/Increase time)    Stair Management Technique One rail Left    Number of Stairs 4    Height of Stairs 7    Gait Comments 10x ascend and decend stairs      Knee/Hip Exercises: Machines for Strengthening   Other Machine leg press 3x8 20#      Knee/Hip Exercises: Standing   Lateral Step Up Both;2 sets;5 reps;Hand Hold:  0;Step Height: 6"    Forward Step Up 2 sets;Hand Hold: 0;Step Height: 8";15 reps    Step Down 2 sets;10 reps;Hand Hold: 0;Step Height: 6";Both    Step Down Limitations step up and over    Functional Squat 15 reps;2 sets    Other Standing Knee Exercises lateral stepping with green band at ankles with mini squat 6x15 feet bilateral                  PT Education - 01/11/20 1516    Education Details review of HEP, continuing HEP, exercise mechanics    Person(s) Educated Patient    Methods Explanation;Demonstration    Comprehension Verbalized understanding;Returned demonstration            PT Short Term Goals - 01/06/20 1612      PT SHORT TERM GOAL #1   Title Patient will be independent with HEP in order to improve functional outcomes.    Time 3    Period Weeks    Status Achieved    Target Date 12/28/19      PT SHORT TERM GOAL #2   Title Patient will report at least 25% improvement in symptoms for improved quality of life.    Time 6    Period Weeks    Status Achieved    Target Date 12/28/19             PT Long Term Goals - 01/06/20 1551      PT LONG TERM GOAL #1   Title Patient will report at least 75% improvement in symptoms for improved quality of life.    Baseline 75% limited    Time 6    Period Weeks    Status Achieved      PT LONG TERM GOAL #2   Title Patient will improve FOTO score by at least 5 points in order to indicate improved tolerance to activity.    Time 6    Period Weeks    Status Partially Met      PT LONG TERM GOAL #3   Title Patient will be able to ambulate at least 325 feet in 2MWT in order to demonstrate improved gait speed for community ambulation.    Time 6    Period Weeks    Status Achieved      PT LONG TERM GOAL #4   Title Patient will be able to perform sit to stand without UE use in order to demonstrate improved functional strength.    Time 6    Period Weeks    Status Achieved                 Plan - 01/11/20 1517  Clinical Impression Statement Patient showing improving LE strength and is able to complete step ups at increased height without UE support. Patient able to complete lateral step downs with emphasis on eccentric control from increased step height but demonstrates dynamic knee valgus with eccentric phase L>R. Patient completes lateral stepping with mini squat today with band at knees for hip strengthening with ease, she fatigues quickly with band at ankles for increased moment arm. Patient demonstrates improving motor control on leg press today with improved endurance. Patient educated on joining a gym for improving/maintaining mobility. Anticipate d/c next session. Patient will continue to benefit from skilled physical therapy in order to reduce impairment and improve function    Personal Factors and Comorbidities Comorbidity 3+;Past/Current Experience;Time since onset of injury/illness/exacerbation;Fitness;Age    Comorbidities Chronic LBP, scolosis, neurological disease, chronic knee pain    Examination-Activity Limitations Bed Mobility;Bend;Locomotion Level;Transfers;Stairs;Stand;Toileting;Squat;Sleep;Dressing;Carry    Examination-Participation Restrictions Cleaning;Community Activity;[employer] Work;Meal Prep;Shop;Volunteer;Laundry    Stability/Clinical Decision Making Stable/Uncomplicated    Rehab Potential Fair    PT Frequency 2x / week    PT Duration 6 weeks    PT Treatment/Interventions ADLs/Self Care Home Management;Aquatic Therapy;Cryotherapy;Electrical Stimulation;Biofeedback;Moist Heat;Traction;Ultrasound;Parrafin;Fluidtherapy;Contrast Bath;DME Instruction;Gait training;Functional mobility training;Stair training;Therapeutic activities;Therapeutic exercise;Balance training;Neuromuscular re-education;Patient/family education;Orthotic Fit/Training;Manual techniques;Manual lymph drainage;Compression bandaging;Scar mobilization;Passive range of motion;Dry needling;Energy  conservation;Splinting;Taping;Vasopneumatic Device;Vestibular;Spinal Manipulations;Joint Manipulations    PT Next Visit Plan continue with LE strengthening with focus on stair strengthening.    PT Home Exercise Plan 6/15 LAQ, quad sets; added heelraise, squat and active hamstring stretch 6/29 clams           Patient will benefit from skilled therapeutic intervention in order to improve the following deficits and impairments:  Abnormal gait, Decreased range of motion, Difficulty walking, Decreased endurance, Increased muscle spasms, Decreased activity tolerance, Decreased knowledge of use of DME, Decreased balance, Pain, Decreased mobility, Decreased strength, Improper body mechanics, Impaired flexibility, Hypomobility  Visit Diagnosis: Right knee pain, unspecified chronicity  Left knee pain, unspecified chronicity  Muscle weakness (generalized)  Other abnormalities of gait and mobility  Other symptoms and signs involving the musculoskeletal system  Stiffness of right knee, not elsewhere classified  Stiffness of left knee, not elsewhere classified     Problem List Patient Active Problem List   Diagnosis Date Noted  . Acute GI bleed due to bleeding pyloric/gastric ulcer 05/15/2019  . PUD (peptic ulcer disease)-EGD 05/15/2019 with bleeding pyloric/gastric ulcer, nonbleeding duodenal ulcers and gastritis   . Hematemesis with nausea   . Migraine headache 09/25/2015  . 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 Bilateral occipital neuralgia 01/02/2015  . Migraine 01/02/2015  . 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 DDD (degenerative disc disease), cervical 10/27/2014  . 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 DDD (degenerative disc disease), lumbosacral 10/27/2014  . Partial tear subscapularis tendon 12/27/2011    4:05 PM, 01/11/20 Mearl Latin PT, DPT Physical Therapist at [facility] [place], [place], [address] Phone: [phone]   Fax:  [phone]  Name: Sharon Welch MRN: [id number] Date of Birth: 1971/01/15

## 2020-01-13 ENCOUNTER — Ambulatory Visit (HOSPITAL_COMMUNITY): Payer: Medicare Other | Admitting: Physical Therapy

## 2020-01-13 ENCOUNTER — Other Ambulatory Visit: Payer: Self-pay

## 2020-01-13 ENCOUNTER — Encounter (HOSPITAL_COMMUNITY): Payer: Self-pay | Admitting: Physical Therapy

## 2020-01-13 DIAGNOSIS — R2689 Other abnormalities of gait and mobility: Secondary | ICD-10-CM

## 2020-01-13 DIAGNOSIS — M25661 Stiffness of right knee, not elsewhere classified: Secondary | ICD-10-CM

## 2020-01-13 DIAGNOSIS — M6281 Muscle weakness (generalized): Secondary | ICD-10-CM

## 2020-01-13 DIAGNOSIS — R29898 Other symptoms and signs involving the musculoskeletal system: Secondary | ICD-10-CM

## 2020-01-13 DIAGNOSIS — M25561 Pain in right knee: Secondary | ICD-10-CM

## 2020-01-13 DIAGNOSIS — M25562 Pain in left knee: Secondary | ICD-10-CM | POA: Diagnosis not present

## 2020-01-13 DIAGNOSIS — M25662 Stiffness of left knee, not elsewhere classified: Secondary | ICD-10-CM

## 2020-01-13 NOTE — Therapy (Signed)
[facility] [place], [place], [address] Phone: [phone]   Fax:  [phone]  Physical Therapy Treatment/Discharge Summary  Patient Details  Name: Sharon Welch MRN: [id number] Date of Birth: 07/26/1970 Referring Provider (PT): Arther Abbott MD   Encounter Date: 01/13/2020    PHYSICAL THERAPY DISCHARGE SUMMARY  Visits from Start of Care: 12  Current functional level related to goals / functional outcomes: See below   Remaining deficits: See below   Education / Equipment: See below  Plan: Patient agrees to discharge.  Patient goals were met. Patient is being discharged due to meeting the stated rehab goals.  ?????        PT End of Session - 01/13/20 1527    Visit Number 12    Number of Visits 12    Date for PT Re-Evaluation 01/18/20    Authorization Type Primary: medicare Secondary :medicaid of [place]    Progress Note Due on Visit 20    PT Start Time 1520    PT Stop Time 1537    PT Time Calculation (min) 17 min    Activity Tolerance Patient tolerated treatment well    Behavior During Therapy WFL for tasks assessed/performed           Past Medical History:  Diagnosis Date  . Bruising, spontaneous 12/08/2013  . Chronic back pain   . Chronic back pain   . DDD (degenerative disc disease), lumbar   . DDD (degenerative disc disease), lumbar   . Fibromyalgia   . Headache(784.0)   . Ovarian cyst   . Partial tear subscapularis tendon 12/27/2011  . Scoliosis   . Seizures ([place])    started 9/12-    Past Surgical History:  Procedure Laterality Date  . ABDOMINAL HYSTERECTOMY  2012  . BACK SURGERY  1989   scoliosis throsic-rods  . CESAREAN SECTION    . CHOLECYSTECTOMY N/A 11/17/2012   Procedure: LAPAROSCOPIC CHOLECYSTECTOMY WITH INTRAOPERATIVE CHOLANGIOGRAM;  Surgeon: Ralene Ok, MD;  Location: [facility];  Service: General;  Laterality: N/A;  . ENDOSCOPIC RETROGRADE CHOLANGIOPANCREATOGRAPHY (ERCP) WITH  PROPOFOL N/A 06/11/2019   Procedure: ENDOSCOPIC RETROGRADE CHOLANGIOPANCREATOGRAPHY (ERCP) WITH PROPOFOL;  Surgeon: Carol Ada, MD;  [facility];  Service: Endoscopy;  Laterality: N/A;  . ERCP N/A 05/07/2013   Procedure: ENDOSCOPIC RETROGRADE CHOLANGIOPANCREATOGRAPHY (ERCP);  Surgeon: Beryle Beams, MD;  Location: [facility];  Service: Endoscopy;  Laterality: N/A;  start ercp first, if canulation fails switch to EUS   . ESOPHAGOGASTRODUODENOSCOPY (EGD) WITH PROPOFOL N/A 05/15/2019   Procedure: ESOPHAGOGASTRODUODENOSCOPY (EGD) WITH PROPOFOL;  Surgeon: Danie Binder, MD;  [facility];  Service: Endoscopy;  Laterality: N/A;  . EUS N/A 11/03/2012   Procedure: UPPER ENDOSCOPIC ULTRASOUND (EUS) LINEAR;  Surgeon: Beryle Beams, MD;  [facility];  Service: Endoscopy;  Laterality: N/A;  . left arm    . NECK SURGERY    . REMOVAL OF STONES  06/11/2019   Procedure: REMOVAL OF STONES;  Surgeon: Carol Ada, MD;  [facility];  Service: Endoscopy;;  . TUBAL LIGATION    . UPPER ESOPHAGEAL ENDOSCOPIC ULTRASOUND (EUS) N/A 06/11/2019   Procedure: UPPER ESOPHAGEAL ENDOSCOPIC ULTRASOUND (EUS);  Surgeon: Carol Ada, MD;  Location: [facility];  Service: Endoscopy;  Laterality: N/A;    There were no vitals filed for this visit.   Subjective Assessment - 01/13/20 1520    Subjective Patient states she was sore in her calf muscles after last session. Patient states 90%  improvement with PT intervention. Her home exercises are going well. Her pain has not been above a 3/10 and she feels she can manage her condition on her own.    Limitations House hold activities;Standing;Walking;Other (comment)    How long can you walk comfortably? 30 minutes    Patient Stated Goals to work her leg so it's not painful and keep her knee from locking up, and avoid surgery at all costs    Currently in Pain? Yes    Pain Score 1     Pain Location Knee    Pain Orientation  Right;Left    Pain Onset More than a month ago              [facility] PT Assessment - 01/13/20 0001      Assessment   Medical Diagnosis Chronic bilateral knee pain    Referring Provider (PT) Arther Abbott MD    Onset Date/Surgical Date 12/07/18    Next MD Visit None scheduled       Precautions   Precautions None      Restrictions   Weight Bearing Restrictions No      Balance Screen   Has the patient fallen in the past 6 months Yes    How many times? 1-2    Has the patient had a decrease in activity level because of a fear of falling?  No    Is the patient reluctant to leave their home because of a fear of falling?  No      Prior Function   Level of Independence Independent    Vocation On disability    Vocation Requirements former caregiver      Cognition   Overall Cognitive Status Within Functional Limits for tasks assessed      Observation/Other Assessments   Skin Integrity Patient ambulates without AD    Focus on Therapeutic Outcomes (FOTO)  41% limited       AROM   Right Knee Extension 0    Right Knee Flexion 124    Left Knee Extension 0    Left Knee Flexion 156      Strength   Right Hip Flexion 4+/5    Left Hip Flexion 4/5    Right Knee Flexion 4/5    Right Knee Extension 5/5    Left Knee Flexion 4/5    Left Knee Extension 5/5    Right Ankle Dorsiflexion 5/5    Left Ankle Dorsiflexion 5/5      Ambulation/Gait   Ambulation/Gait Yes    Ambulation/Gait Assistance 7: Independent    Stairs Yes    Stairs Assistance 6: Modified independent (Device/Increase time)    Stair Management Technique One rail Left    Number of Stairs 4    Height of Stairs 7    Gait Comments 2x ascend and decend stairs with good motor control                                 PT Education - 01/13/20 1526    Education Details Patient educated on reassessment findings, [employer] membership, HEP, returning to PT if necessary.    Person(s) Educated Patient     Methods Explanation;Demonstration    Comprehension Verbalized understanding;Returned demonstration            PT Short Term Goals - 01/06/20 1612      PT SHORT TERM GOAL #1   Title Patient will be independent with  HEP in order to improve functional outcomes.    Time 3    Period Weeks    Status Achieved    Target Date 12/28/19      PT SHORT TERM GOAL #2   Title Patient will report at least 25% improvement in symptoms for improved quality of life.    Time 6    Period Weeks    Status Achieved    Target Date 12/28/19             PT Long Term Goals - 01/13/20 1531      PT LONG TERM GOAL #1   Title Patient will report at least 75% improvement in symptoms for improved quality of life.    Baseline 75% limited    Time 6    Period Weeks    Status Achieved      PT LONG TERM GOAL #2   Title Patient will improve FOTO score by at least 5 points in order to indicate improved tolerance to activity.    Time 6    Period Weeks    Status Partially Met      PT LONG TERM GOAL #3   Title Patient will be able to ambulate at least 325 feet in 2MWT in order to demonstrate improved gait speed for community ambulation.    Time 6    Period Weeks    Status Achieved      PT LONG TERM GOAL #4   Title Patient will be able to perform sit to stand without UE use in order to demonstrate improved functional strength.    Time 6    Period Weeks    Status Achieved                 Plan - 01/13/20 1549    Clinical Impression Statement Patient has met all short and long term goals at this time with ability to complete HEP, improvement in symptoms, improved gait and balance, and overall functional mobility. Patient also demonstrates improved ROM, functional strength, and can navigate stairs with good eccentric control. Patient remains limited by low grade pain bilaterally. Patient FOTO score states she remains limited but she answered the questions differently than she did on first day she  states. Patient discharged at this time.    Personal Factors and Comorbidities Comorbidity 3+;Past/Current Experience;Time since onset of injury/illness/exacerbation;Fitness;Age    Comorbidities Chronic LBP, scolosis, neurological disease, chronic knee pain    Examination-Activity Limitations Bed Mobility;Bend;Locomotion Level;Transfers;Stairs;Stand;Toileting;Squat;Sleep;Dressing;Carry    Examination-Participation Restrictions Cleaning;Community Activity;[employer] Work;Meal Prep;Shop;Volunteer;Laundry    Stability/Clinical Decision Making Stable/Uncomplicated    Rehab Potential Fair    PT Frequency --    PT Duration --    PT Treatment/Interventions ADLs/Self Care Home Management;Aquatic Therapy;Cryotherapy;Electrical Stimulation;Biofeedback;Moist Heat;Traction;Ultrasound;Parrafin;Fluidtherapy;Contrast Bath;DME Instruction;Gait training;Functional mobility training;Stair training;Therapeutic activities;Therapeutic exercise;Balance training;Neuromuscular re-education;Patient/family education;Orthotic Fit/Training;Manual techniques;Manual lymph drainage;Compression bandaging;Scar mobilization;Passive range of motion;Dry needling;Energy conservation;Splinting;Taping;Vasopneumatic Device;Vestibular;Spinal Manipulations;Joint Manipulations    PT Next Visit Plan n/a    PT Home Exercise Plan 6/15 LAQ, quad sets; added heelraise, squat and active hamstring stretch 6/29 clams           Patient will benefit from skilled therapeutic intervention in order to improve the following deficits and impairments:  Abnormal gait, Decreased range of motion, Difficulty walking, Decreased endurance, Increased muscle spasms, Decreased activity tolerance, Decreased knowledge of use of DME, Decreased balance, Pain, Decreased mobility, Decreased strength, Improper body mechanics, Impaired flexibility, Hypomobility  Visit Diagnosis: Right knee pain, unspecified chronicity  Left knee pain, unspecified chronicity  Muscle  weakness (generalized)  Other abnormalities of gait and mobility  Other symptoms and signs involving the musculoskeletal system  Stiffness of right knee, not elsewhere classified  Stiffness of left knee, not elsewhere classified     Problem List Patient Active Problem List   Diagnosis Date Noted  . Acute GI bleed due to bleeding pyloric/gastric ulcer 05/15/2019  . PUD (peptic ulcer disease)-EGD 05/15/2019 with bleeding pyloric/gastric ulcer, nonbleeding duodenal ulcers and gastritis   . Hematemesis with nausea   . Migraine headache 09/25/2015  . 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 Bilateral occipital neuralgia 01/02/2015  . Migraine 01/02/2015  . 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 DDD (degenerative disc disease), cervical 10/27/2014  . 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 DDD (degenerative disc disease), lumbosacral 10/27/2014  . Partial tear subscapularis tendon 12/27/2011    3:55 PM, 01/13/20 Mearl Latin PT, DPT Physical Therapist at [facility] [place], [place], [address] Phone: [phone]   Fax:  [phone]  Name: Sharon Welch MRN: [id number] Date of Birth: [birth year]

## 2020-01-18 ENCOUNTER — Encounter (HOSPITAL_COMMUNITY): Payer: Medicare Other | Admitting: Physical Therapy

## 2020-01-20 ENCOUNTER — Encounter (HOSPITAL_COMMUNITY): Payer: Medicare Other | Admitting: Physical Therapy

## 2020-01-24 DIAGNOSIS — E039 Hypothyroidism, unspecified: Secondary | ICD-10-CM | POA: Diagnosis not present

## 2020-01-24 DIAGNOSIS — G894 Chronic pain syndrome: Secondary | ICD-10-CM | POA: Diagnosis not present

## 2020-01-24 DIAGNOSIS — G4700 Insomnia, unspecified: Secondary | ICD-10-CM | POA: Diagnosis not present

## 2020-01-24 DIAGNOSIS — Z681 Body mass index (BMI) 19 or less, adult: Secondary | ICD-10-CM | POA: Diagnosis not present

## 2020-01-24 DIAGNOSIS — Z0001 Encounter for general adult medical examination with abnormal findings: Secondary | ICD-10-CM | POA: Diagnosis not present

## 2020-01-24 DIAGNOSIS — Z1389 Encounter for screening for other disorder: Secondary | ICD-10-CM | POA: Diagnosis not present

## 2020-01-24 DIAGNOSIS — E782 Mixed hyperlipidemia: Secondary | ICD-10-CM | POA: Diagnosis not present

## 2020-01-25 DIAGNOSIS — K7689 Other specified diseases of liver: Secondary | ICD-10-CM | POA: Diagnosis not present

## 2020-01-31 DIAGNOSIS — G43909 Migraine, unspecified, not intractable, without status migrainosus: Secondary | ICD-10-CM | POA: Diagnosis not present

## 2020-01-31 DIAGNOSIS — G4700 Insomnia, unspecified: Secondary | ICD-10-CM | POA: Diagnosis not present

## 2020-01-31 DIAGNOSIS — M47897 Other spondylosis, lumbosacral region: Secondary | ICD-10-CM | POA: Diagnosis not present

## 2020-01-31 DIAGNOSIS — F419 Anxiety disorder, unspecified: Secondary | ICD-10-CM | POA: Diagnosis not present

## 2020-01-31 DIAGNOSIS — G894 Chronic pain syndrome: Secondary | ICD-10-CM | POA: Diagnosis not present

## 2020-01-31 DIAGNOSIS — G8929 Other chronic pain: Secondary | ICD-10-CM | POA: Diagnosis not present

## 2020-01-31 DIAGNOSIS — M25519 Pain in unspecified shoulder: Secondary | ICD-10-CM | POA: Diagnosis not present

## 2020-01-31 DIAGNOSIS — M48062 Spinal stenosis, lumbar region with neurogenic claudication: Secondary | ICD-10-CM | POA: Diagnosis not present

## 2020-01-31 DIAGNOSIS — M25559 Pain in unspecified hip: Secondary | ICD-10-CM | POA: Diagnosis not present

## 2020-01-31 DIAGNOSIS — M79669 Pain in unspecified lower leg: Secondary | ICD-10-CM | POA: Diagnosis not present

## 2020-01-31 DIAGNOSIS — M9951 Intervertebral disc stenosis of neural canal of cervical region: Secondary | ICD-10-CM | POA: Diagnosis not present

## 2020-02-06 DIAGNOSIS — G894 Chronic pain syndrome: Secondary | ICD-10-CM | POA: Diagnosis not present

## 2020-02-23 ENCOUNTER — Other Ambulatory Visit: Payer: Self-pay

## 2020-02-23 ENCOUNTER — Ambulatory Visit (HOSPITAL_COMMUNITY): Payer: Medicare Other | Attending: Orthopedic Surgery | Admitting: Physical Therapy

## 2020-02-23 ENCOUNTER — Encounter (HOSPITAL_COMMUNITY): Payer: Self-pay | Admitting: Physical Therapy

## 2020-02-23 DIAGNOSIS — M6281 Muscle weakness (generalized): Secondary | ICD-10-CM | POA: Diagnosis not present

## 2020-02-23 DIAGNOSIS — R29898 Other symptoms and signs involving the musculoskeletal system: Secondary | ICD-10-CM | POA: Diagnosis not present

## 2020-02-23 DIAGNOSIS — M545 Low back pain, unspecified: Secondary | ICD-10-CM

## 2020-02-23 DIAGNOSIS — R2689 Other abnormalities of gait and mobility: Secondary | ICD-10-CM | POA: Insufficient documentation

## 2020-02-23 NOTE — Therapy (Signed)
[facility] [address] [place], [place], [address] Phone: [phone]   Fax:  [phone]  Physical Therapy Evaluation  Patient Details  Name: Sharon Welch MRN: [id number] Date of Birth: [birth year] Referring Provider (PT): Redmond School MD   Encounter Date: 02/23/2020   PT End of Session - 02/23/20 1605    Visit Number 1    Number of Visits 12    Date for PT Re-Evaluation 04/05/20    Authorization Type Primary: medicare Secondary :medicaid of [place]    Progress Note Due on Visit 10    PT Start Time 1430    PT Stop Time 1515    PT Time Calculation (min) 45 min    Activity Tolerance Patient tolerated treatment well    Behavior During Therapy [facility] for tasks assessed/performed           Past Medical History:  Diagnosis Date   Bruising, spontaneous 12/08/2013   Chronic back pain    Chronic back pain    DDD (degenerative disc disease), lumbar    DDD (degenerative disc disease), lumbar    Fibromyalgia    Headache(784.0)    Ovarian cyst    Partial tear subscapularis tendon 12/27/2011   Scoliosis    Seizures ([place])    started 9/12-    Past Surgical History:  Procedure Laterality Date   ABDOMINAL HYSTERECTOMY  2012   BACK SURGERY  1989   scoliosis throsic-rods   CESAREAN SECTION     CHOLECYSTECTOMY N/A 11/17/2012   Procedure: LAPAROSCOPIC CHOLECYSTECTOMY WITH INTRAOPERATIVE CHOLANGIOGRAM;  Surgeon: Ralene Ok, MD;  Location: [facility];  Service: General;  Laterality: N/A;   ENDOSCOPIC RETROGRADE CHOLANGIOPANCREATOGRAPHY (ERCP) WITH PROPOFOL N/A 06/11/2019   Procedure: ENDOSCOPIC RETROGRADE CHOLANGIOPANCREATOGRAPHY (ERCP) WITH PROPOFOL;  Surgeon: Carol Ada, MD;  [facility];  Service: Endoscopy;  Laterality: N/A;   ERCP N/A 05/07/2013   Procedure: ENDOSCOPIC RETROGRADE CHOLANGIOPANCREATOGRAPHY (ERCP);  Surgeon: Beryle Beams, MD;  Location: [facility];  Service: Endoscopy;  Laterality: N/A;  start ercp  first, if canulation fails switch to EUS    ESOPHAGOGASTRODUODENOSCOPY (EGD) WITH PROPOFOL N/A 05/15/2019   Procedure: ESOPHAGOGASTRODUODENOSCOPY (EGD) WITH PROPOFOL;  Surgeon: Danie Binder, MD;  [facility];  Service: Endoscopy;  Laterality: N/A;   EUS N/A 11/03/2012   Procedure: UPPER ENDOSCOPIC ULTRASOUND (EUS) LINEAR;  Surgeon: Beryle Beams, MD;  [facility];  Service: Endoscopy;  Laterality: N/A;   left arm     NECK SURGERY     REMOVAL OF STONES  06/11/2019   Procedure: REMOVAL OF STONES;  Surgeon: Carol Ada, MD;  [facility];  Service: Endoscopy;;   TUBAL LIGATION     UPPER ESOPHAGEAL ENDOSCOPIC ULTRASOUND (EUS) N/A 06/11/2019   Procedure: UPPER ESOPHAGEAL ENDOSCOPIC ULTRASOUND (EUS);  Surgeon: Carol Ada, MD;  Location: [facility];  Service: Endoscopy;  Laterality: N/A;    There were no vitals filed for this visit.    Subjective Assessment - 02/23/20 1432    Subjective Patient is a 49 y.o. female who presents to physical therapy with c/o chronic low back pain. Patient states her knees and hips are doing good. Patient states she would love to be able to move better and come off her pain medicine. In March of 1989, she had thoracic spine fusion due to scoliosis. About 5-6 years ago she was having increased pain and progressive scoliosis so she had a lumbar fusion. Symptoms are aggravated with almost everything and she has difficulty  with positioning. Symptoms are decreased slightly with changing positions, heat and ice. Pain is from lower thoracic and lumbar region. She occasionally has achy pain into her hips.    Limitations House hold activities;Standing;Walking;Sitting    Patient Stated Goals love to be able to move better and come off her pain medicine    Currently in Pain? Yes    Pain Score 6     Pain Location Back              [facility] PT Assessment - 02/23/20 0001      Assessment   Medical Diagnosis Low Back Pain     Referring Provider (PT) Redmond [employer] MD    Next MD Visit None scheduled       Precautions   Precautions None      Restrictions   Weight Bearing Restrictions No      Balance Screen   Has the patient fallen in the past 6 months No    Has the patient had a decrease in activity level because of a fear of falling?  No    Is the patient reluctant to leave their home because of a fear of falling?  No      Prior Function   Level of Independence Independent    Vocation On disability    Vocation Requirements former caregiver      Cognition   Overall Cognitive Status Within Functional Limits for tasks assessed      Observation/Other Assessments   Observations Patient ambulates without AD, surgical incisions from T4-T12 and L1-L5, Rod protruding from upper lumbar/lower thoracic region on L    Skin Integrity --    Focus on Therapeutic Outcomes (FOTO)  46% limited      ROM / Strength   AROM / PROM / Strength AROM      AROM   Overall AROM Comments pain with all AROM, movement from L5/S1, hips and upper thoracic spine due to hx fusion    AROM Assessment Site Lumbar    Lumbar Flexion 90% limited, movement comes from Upper t/sp and hips    Lumbar Extension 95% limited    Lumbar - Right Side Bend 75% limited    Lumbar - Left Side Bend 75% limited most painful    Lumbar - Right Rotation 75% limited    Lumbar - Left Rotation 75% limited      Strength   Right Hip Flexion 4+/5    Right Hip Extension 3-/5    Right Hip ABduction 4/5    Left Hip Flexion 4/5    Left Hip Extension 3+/5    Left Hip ABduction 4+/5    Right Knee Flexion 5/5    Right Knee Extension 5/5    Left Knee Flexion 5/5    Left Knee Extension 5/5    Right Ankle Dorsiflexion 5/5    Left Ankle Dorsiflexion 5/5      Palpation   Palpation comment TTP grossly throughout spine, rod protruding at upper lumbar spine; spine rotated, minimal muscle mass throughout back                      Objective  measurements completed on examination: See above findings.       [facility] Adult PT Treatment/Exercise - 02/23/20 0001      Exercises   Exercises Lumbar      Lumbar Exercises: Standing   Other Standing Lumbar Exercises standing hip extension 2x10  PT Education - 02/23/20 1444    Education Details Patient educated on exam findings, POC, scope of PT, chronic pain, initial HEP    Person(s) Educated Patient    Methods Explanation;Demonstration    Comprehension Verbalized understanding;Returned demonstration            PT Short Term Goals - 02/23/20 1614      PT SHORT TERM GOAL #1   Title Patient will be independent with HEP in order to improve functional outcomes.    Time 3    Period Weeks    Status New    Target Date 03/15/20      PT SHORT TERM GOAL #2   Title Patient will report at least 25% improvement in symptoms for improved quality of life.    Time 3    Period Weeks    Status New    Target Date 03/01/20             PT Long Term Goals - 02/23/20 1615      PT LONG TERM GOAL #1   Title Patient will report at least 75% improvement in symptoms for improved quality of life.    Time 6    Period Weeks    Status New    Target Date 04/05/20      PT LONG TERM GOAL #2   Title Patient will improve FOTO score by at least 5 points in order to indicate improved tolerance to activity.    Time 6    Period Weeks    Status New    Target Date 04/05/20      PT LONG TERM GOAL #3   Title Patient will report pain no greater than 3/10 over the course of 1 week in her back for improved tolerance to activity.    Time 6    Period Weeks    Status New    Target Date 04/05/20                  Plan - 02/23/20 1606    Clinical Impression Statement Patient is a 49 y.o. female who presents to physical therapy with c/o chronic low back pain with history of extensive lumbar and thoracic fusions due to scoliosis. She presents with pain limited deficits in  lumbar and core strength, ROM, endurance, postural impairments, chronic pain, allodynia  and functional mobility with ADL. She is having to modify and restrict ADL as indicated by FOTO score as well as subjective information and objective measures which is affecting overall participation. Patient will benefit from skilled physical therapy in order to improve function and reduce impairment.    Personal Factors and Comorbidities Comorbidity 3+;Past/Current Experience;Time since onset of injury/illness/exacerbation;Fitness;Age    Comorbidities Chronic LBP, scolosis, neurological disease, chronic knee pain    Examination-Activity Limitations Bed Mobility;Bend;Locomotion Level;Transfers;Stairs;Stand;Toileting;Squat;Sleep;Dressing;Carry    Examination-Participation Restrictions Cleaning;Community Activity;[employer] Work;Meal Prep;Shop;Volunteer;Laundry    Stability/Clinical Decision Making Stable/Uncomplicated    Clinical Decision Making Low    Rehab Potential Fair    PT Frequency 2x / week    PT Duration 6 weeks    PT Treatment/Interventions ADLs/Self Care Home Management;Aquatic Therapy;Cryotherapy;Electrical Stimulation;Biofeedback;Moist Heat;Traction;Ultrasound;Parrafin;Fluidtherapy;Contrast Bath;DME Instruction;Gait training;Functional mobility training;Stair training;Therapeutic activities;Therapeutic exercise;Balance training;Neuromuscular re-education;Patient/family education;Orthotic Fit/Training;Manual techniques;Manual lymph drainage;Compression bandaging;Scar mobilization;Passive range of motion;Dry needling;Energy conservation;Splinting;Taping;Vasopneumatic Device;Vestibular;Spinal Manipulations;Joint Manipulations    PT Next Visit Plan begin core strengthening with ab sets, marches, etc and progress as tolerated; hip extensor strengthening, hip hinging, possibly begin manual for desentization/pain    PT Home Exercise Plan  9/1 standing hip extension    Consulted and Agree with Plan of Care Patient            Patient will benefit from skilled therapeutic intervention in order to improve the following deficits and impairments:  Decreased range of motion, Decreased endurance, Increased muscle spasms, Decreased activity tolerance, Decreased knowledge of use of DME, Decreased balance, Pain, Decreased mobility, Decreased strength, Improper body mechanics, Impaired flexibility, Hypomobility, Difficulty walking, Postural dysfunction  Visit Diagnosis: Low back pain, unspecified back pain laterality, unspecified chronicity, unspecified whether sciatica present  Muscle weakness (generalized)  Other abnormalities of gait and mobility  Other symptoms and signs involving the musculoskeletal system     Problem List Patient Active Problem List   Diagnosis Date Noted   Acute GI bleed due to bleeding pyloric/gastric ulcer 05/15/2019   PUD (peptic ulcer disease)-EGD 05/15/2019 with bleeding pyloric/gastric ulcer, nonbleeding duodenal ulcers and gastritis    Hematemesis with nausea    Migraine headache 09/25/2015   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   Bilateral occipital neuralgia 01/02/2015   Migraine 01/02/2015   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/2014   DDD (degenerative disc disease), cervical 10/27/2014   DDD (degenerative disc disease), 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   DDD (degenerative disc disease), lumbosacral 10/27/2014   Partial tear subscapularis tendon 12/27/2011    4:19 PM, 02/23/20 Mearl Latin PT, DPT Physical Therapist at [facility] [address] [place], [place], [address] Phone: [phone]   Fax:  [phone]  Name: Sharon Welch MRN: [id number] Date of Birth: 05-14-1971

## 2020-02-23 NOTE — Patient Instructions (Signed)
Access Code: [email or web address] URL: [email or web address] Date: 02/23/2020 Prepared by: Mitzi Hansen Geni Skorupski  Exercises Standing Hip Extension with Counter Support - 1 x daily - 7 x weekly - 2 sets - 10 reps

## 2020-02-25 ENCOUNTER — Other Ambulatory Visit: Payer: Self-pay

## 2020-02-25 ENCOUNTER — Ambulatory Visit (HOSPITAL_COMMUNITY): Payer: Medicare Other

## 2020-02-25 DIAGNOSIS — M545 Low back pain, unspecified: Secondary | ICD-10-CM

## 2020-02-25 DIAGNOSIS — M6281 Muscle weakness (generalized): Secondary | ICD-10-CM

## 2020-02-25 DIAGNOSIS — R2689 Other abnormalities of gait and mobility: Secondary | ICD-10-CM

## 2020-02-25 DIAGNOSIS — R29898 Other symptoms and signs involving the musculoskeletal system: Secondary | ICD-10-CM | POA: Diagnosis not present

## 2020-02-25 NOTE — Therapy (Signed)
[place], [place], [address] Phone: [phone]   Fax:  [phone]  Physical Therapy Treatment  Patient Details  Name: Sharon Welch MRN: [id number] Date of Birth: 12/06/1970 Referring Provider (PT): Redmond School MD   Encounter Date: 02/25/2020   PT End of Session - 02/25/20 0925    Visit Number 2    Number of Visits 12    Date for PT Re-Evaluation 04/05/20    Authorization Type Primary: medicare Secondary :medicaid of [place]    Progress Note Due on Visit 10    PT Start Time 0849    PT Stop Time 0928    PT Time Calculation (min) 39 min    Activity Tolerance Patient tolerated treatment well    Behavior During Therapy [facility] for tasks assessed/performed           Past Medical History:  Diagnosis Date  . Bruising, spontaneous 12/08/2013  . Chronic back pain   . Chronic back pain   . DDD (degenerative disc disease), lumbar   . DDD (degenerative disc disease), lumbar   . Fibromyalgia   . Headache(784.0)   . Ovarian cyst   . Partial tear subscapularis tendon 12/27/2011  . Scoliosis   . Seizures ([place])    started 9/12-    Past Surgical History:  Procedure Laterality Date  . ABDOMINAL HYSTERECTOMY  2012  . BACK SURGERY  1989   scoliosis throsic-rods  . CESAREAN SECTION    . CHOLECYSTECTOMY N/A 11/17/2012   Procedure: LAPAROSCOPIC CHOLECYSTECTOMY WITH INTRAOPERATIVE CHOLANGIOGRAM;  Surgeon: Ralene Ok, MD;  Location: [place];  Service: General;  Laterality: N/A;  . ENDOSCOPIC RETROGRADE CHOLANGIOPANCREATOGRAPHY (ERCP) WITH PROPOFOL N/A 06/11/2019   Procedure: ENDOSCOPIC RETROGRADE CHOLANGIOPANCREATOGRAPHY (ERCP) WITH PROPOFOL;  Surgeon: Carol Ada, MD;  [facility];  Service: Endoscopy;  Laterality: N/A;  . ERCP N/A 05/07/2013   Procedure: ENDOSCOPIC RETROGRADE CHOLANGIOPANCREATOGRAPHY (ERCP);  Surgeon: Beryle Beams, MD;  Location: [facility];  Service: Endoscopy;  Laterality: N/A;  start ercp  first, if canulation fails switch to EUS   . ESOPHAGOGASTRODUODENOSCOPY (EGD) WITH PROPOFOL N/A 05/15/2019   Procedure: ESOPHAGOGASTRODUODENOSCOPY (EGD) WITH PROPOFOL;  Surgeon: Danie Binder, MD;  [facility];  Service: Endoscopy;  Laterality: N/A;  . EUS N/A 11/03/2012   Procedure: UPPER ENDOSCOPIC ULTRASOUND (EUS) LINEAR;  Surgeon: Beryle Beams, MD;  Location: [facility];  Service: Endoscopy;  Laterality: N/A;  . left arm    . NECK SURGERY    . REMOVAL OF STONES  06/11/2019   Procedure: REMOVAL OF STONES;  Surgeon: Carol Ada, MD;  Location: [facility];  Service: Endoscopy;;  . TUBAL LIGATION    . UPPER ESOPHAGEAL ENDOSCOPIC ULTRASOUND (EUS) N/A 06/11/2019   Procedure: UPPER ESOPHAGEAL ENDOSCOPIC ULTRASOUND (EUS);  Surgeon: Carol Ada, MD;  Location: [facility];  Service: Endoscopy;  Laterality: N/A;    There were no vitals filed for this visit.   Subjective Assessment - 02/25/20 0901    Subjective Patient has not taken pain medicine this am. Pain throughout back, jumps around. Woke up with left neck pain all the way down to the left hip.    Limitations House hold activities;Standing;Walking;Sitting    Patient Stated Goals love to be able to move better and come off her pain medicine    Currently in Pain? Yes    Pain Score 6     Pain Location Back    Pain Orientation Left    Pain Descriptors /  Indicators Aching               [facility] Adult PT Treatment/Exercise - 02/25/20 0001      Lumbar Exercises: Standing   Heel Raises 15 reps      Lumbar Exercises: Supine   Ab Set 10 reps;5 seconds    Clam 10 reps;3 seconds    Clam Limitations w/ RTB    Heel Slides 10 reps    Bridge 10 reps;3 seconds    Other Supine Lumbar Exercises marching x10      Knee/Hip Exercises: Prone   Hamstring Curl 1 set;10 reps    Hip Extension Strengthening;Both;1 set;10 reps   AAROM on right             PT Education - 02/25/20 0902    Education Details Review goals  and eval. Discussed purpose and technique of interventions throughout session. Advanced HEP.    Person(s) Educated Patient    Methods Explanation;Demonstration;Handout    Comprehension Verbalized understanding;Returned demonstration            PT Short Term Goals - 02/23/20 1614      PT SHORT TERM GOAL #1   Title Patient will be independent with HEP in order to improve functional outcomes.    Time 3    Period Weeks    Status New    Target Date 03/15/20      PT SHORT TERM GOAL #2   Title Patient will report at least 25% improvement in symptoms for improved quality of life.    Time 3    Period Weeks    Status New    Target Date 03/01/20             PT Long Term Goals - 02/23/20 1615      PT LONG TERM GOAL #1   Title Patient will report at least 75% improvement in symptoms for improved quality of life.    Time 6    Period Weeks    Status New    Target Date 04/05/20      PT LONG TERM GOAL #2   Title Patient will improve FOTO score by at least 5 points in order to indicate improved tolerance to activity.    Time 6    Period Weeks    Status New    Target Date 04/05/20      PT LONG TERM GOAL #3   Title Patient will report pain no greater than 3/10 over the course of 1 week in her back for improved tolerance to activity.    Time 6    Period Weeks    Status New    Target Date 04/05/20                 Plan - 02/25/20 0934    Clinical Impression Statement Reviewed evaluation and goals with patient. Progressed therapeutic exercises primarily bed level to include, ab set, bridge, supine clam with red theraband, heel slides, marching, prone hip extension, and hamstring curls. Patient complained of discomfort with bridges and prone hip extension. Patient required assistance for right hip extension. Advanced HEP to include ab set, march, heel slide and supine clams with red theraband given to patient. Overall patient tolerated session well but did report and increase in  pain to a 7 from a 6 at beginning of session. Patient adamant regarding pushing through to the end goal and not letting short term increase in pain stop her. PT stated to communicate with therapists regarding  longer last pain and possible need to change positioning for some of the exercises. Continue with current plan, progress as able.    Personal Factors and Comorbidities Comorbidity 3+;Past/Current Experience;Time since onset of injury/illness/exacerbation;Fitness;Age    Comorbidities Chronic LBP, scolosis, neurological disease, chronic knee pain    Examination-Activity Limitations Bed Mobility;Bend;Locomotion Level;Transfers;Stairs;Stand;Toileting;Squat;Sleep;Dressing;Carry    Examination-Participation Restrictions Cleaning;Community Activity;[place] [employer] Work;Meal Prep;Shop;Volunteer;Laundry    Stability/Clinical Decision Making Stable/Uncomplicated    Rehab Potential Fair    PT Frequency 2x / week    PT Duration 6 weeks    PT Treatment/Interventions ADLs/Self Care Home Management;Aquatic Therapy;Cryotherapy;Electrical Stimulation;Biofeedback;Moist Heat;Traction;Ultrasound;Parrafin;Fluidtherapy;Contrast Bath;DME Instruction;Gait training;Functional mobility training;Stair training;Therapeutic activities;Therapeutic exercise;Balance training;Neuromuscular re-education;Patient/family education;Orthotic Fit/Training;Manual techniques;Manual lymph drainage;Compression bandaging;Scar mobilization;Passive range of motion;Dry needling;Energy conservation;Splinting;Taping;Vasopneumatic Device;Vestibular;Spinal Manipulations;Joint Manipulations    PT Next Visit Plan begin core strengthening with ab sets, marches, etc and progress as tolerated; hip extensor strengthening, hip hinging, possibly begin manual for desentization/pain    PT Home Exercise Plan 9/1 standing hip extension; 02/25/20 - ab set, march, heel slides, supine clam with RTB    Consulted and Agree with Plan of Care Patient           Patient will  benefit from skilled therapeutic intervention in order to improve the following deficits and impairments:  Decreased range of motion, Decreased endurance, Increased muscle spasms, Decreased activity tolerance, Decreased knowledge of use of DME, Decreased balance, Pain, Decreased mobility, Decreased strength, Improper body mechanics, Impaired flexibility, Hypomobility, Difficulty walking, Postural dysfunction  Visit Diagnosis: Low back pain, unspecified back pain laterality, unspecified chronicity, unspecified whether sciatica present  Muscle weakness (generalized)  Other abnormalities of gait and mobility  Other symptoms and signs involving the musculoskeletal system     Problem List Patient Active Problem List   Diagnosis Date Noted  . Acute GI bleed due to bleeding pyloric/gastric ulcer 05/15/2019  . PUD (peptic ulcer disease)-EGD 05/15/2019 with bleeding pyloric/gastric ulcer, nonbleeding duodenal ulcers and gastritis   . Hematemesis with nausea   . Migraine headache 09/25/2015  . 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 Bilateral occipital neuralgia 01/02/2015  . Migraine 01/02/2015  . 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 DDD (degenerative disc disease), cervical 10/27/2014  . 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 DDD (degenerative disc disease), lumbosacral 10/27/2014  . Partial tear subscapularis tendon 12/27/2011    Floria Raveling. Hartnett-Rands, MS, PT [facility] #[id number] 02/25/2020, 9:36 AM  [place] [address] [place], [place], [address] Phone: [phone]   Fax:  [phone]  Name: Sharon Welch MRN: [id number] Date of Birth: [birth year]

## 2020-02-25 NOTE — Patient Instructions (Signed)
Isometric Abdominal    Lying on back with knees bent, tighten stomach by pressing elbows down. Hold _5___ seconds. Repeat _10___ times per set. Do _1___ sets per session. Do _1__ sessions per day.  http://orth.exer.us/1087   Copyright  VHI. All rights reserved.   Abdominals: Single Leg Bend    Lying on back with legs out straight, inhale, then exhale while slowly sliding heel along floor toward buttocks. Slowly return to starting position. Repeat 10_ times each leg per set. Do _1___ sets per session. Do _1__ sessions per day.  Copyright  VHI. All rights reserved.  Marching Supine    Lie supine with knees flexed. Do ab set. Raise one leg a few inches. Hold briefly, return and raise other leg. Keep hips stationary. Do 1 sets of _10__ repetitions.  Copyright  VHI. All rights reserved.

## 2020-02-29 DIAGNOSIS — M9904 Segmental and somatic dysfunction of sacral region: Secondary | ICD-10-CM | POA: Diagnosis not present

## 2020-02-29 DIAGNOSIS — M5136 Other intervertebral disc degeneration, lumbar region: Secondary | ICD-10-CM | POA: Diagnosis not present

## 2020-02-29 DIAGNOSIS — M419 Scoliosis, unspecified: Secondary | ICD-10-CM | POA: Diagnosis not present

## 2020-02-29 DIAGNOSIS — M7600 Gluteal tendinitis, unspecified hip: Secondary | ICD-10-CM | POA: Diagnosis not present

## 2020-03-01 ENCOUNTER — Other Ambulatory Visit: Payer: Self-pay | Admitting: Gastroenterology

## 2020-03-01 DIAGNOSIS — R195 Other fecal abnormalities: Secondary | ICD-10-CM | POA: Diagnosis not present

## 2020-03-01 DIAGNOSIS — K921 Melena: Secondary | ICD-10-CM | POA: Diagnosis not present

## 2020-03-01 DIAGNOSIS — R197 Diarrhea, unspecified: Secondary | ICD-10-CM | POA: Diagnosis not present

## 2020-03-02 ENCOUNTER — Other Ambulatory Visit: Payer: Self-pay

## 2020-03-02 ENCOUNTER — Other Ambulatory Visit (HOSPITAL_COMMUNITY)
Admission: RE | Admit: 2020-03-02 | Discharge: 2020-03-02 | Disposition: A | Discharge status: Home / Self Care | Payer: Medicare Other | Source: Ambulatory Visit | Attending: Gastroenterology | Admitting: Gastroenterology

## 2020-03-02 DIAGNOSIS — Z01812 Encounter for preprocedural laboratory examination: Secondary | ICD-10-CM | POA: Insufficient documentation

## 2020-03-02 DIAGNOSIS — Z20822 Contact with and (suspected) exposure to covid-19: Secondary | ICD-10-CM | POA: Insufficient documentation

## 2020-03-02 LAB — SARS CORONAVIRUS 2 (TAT 6-24 HRS): SARS Coronavirus 2: NEGATIVE

## 2020-03-02 NOTE — Anesthesia Preprocedure Evaluation (Addendum)
Anesthesia Evaluation  Patient identified by MRN, date of birth, ID band Patient awake    Reviewed: Allergy & Precautions, NPO status , Patient's Chart, lab work & pertinent test results  Airway Mallampati: I  TM Distance: >3 FB Neck ROM: Full    Dental  (+)    Pulmonary Current Smoker and Patient abstained from smoking.,    breath sounds clear to auscultation       Cardiovascular Exercise Tolerance: Good negative cardio ROS Normal cardiovascular exam Rhythm:Regular Rate:Normal     Neuro/Psych  Headaches, Seizures -,   Neuromuscular disease    GI/Hepatic Neg liver ROS, PUD, melena   Endo/Other  negative endocrine ROS  Renal/GU negative Renal ROS  negative genitourinary   Musculoskeletal  (+) Arthritis , Fibromyalgia -Chronic back pain   Abdominal Normal abdominal exam  (+)   Peds  Hematology   Anesthesia Other Findings   Reproductive/Obstetrics                           Anesthesia Physical Anesthesia Plan  ASA: II  Anesthesia Plan: MAC   Post-op Pain Management:    Induction:   PONV Risk Score and Plan: 1 and Propofol infusion and Treatment may vary due to age or medical condition  Airway Management Planned:   Additional Equipment:   Intra-op Plan:   Post-operative Plan:   Informed Consent: I have reviewed the patients History and Physical, chart, labs and discussed the procedure including the risks, benefits and alternatives for the proposed anesthesia with the patient or authorized representative who has indicated his/her understanding and acceptance.     Dental advisory given  Plan Discussed with:   Anesthesia Plan Comments:        Anesthesia Quick Evaluation

## 2020-03-02 NOTE — Progress Notes (Signed)
Attempted to obtain medical history via telephone, unable to reach at this time. I left a voicemail to return pre surgical testing department's phone call.  

## 2020-03-03 ENCOUNTER — Ambulatory Visit (HOSPITAL_COMMUNITY): Payer: Medicare Other | Admitting: Certified Registered Nurse Anesthetist

## 2020-03-03 ENCOUNTER — Encounter (HOSPITAL_COMMUNITY): Payer: Self-pay | Admitting: Gastroenterology

## 2020-03-03 ENCOUNTER — Encounter (HOSPITAL_COMMUNITY)
Admission: RE | Disposition: A | Discharge status: Home / Self Care | Payer: Self-pay | Source: Home / Self Care | Attending: Gastroenterology

## 2020-03-03 ENCOUNTER — Ambulatory Visit (HOSPITAL_COMMUNITY)
Admission: RE | Admit: 2020-03-03 | Discharge: 2020-03-03 | Disposition: A | Discharge status: Home / Self Care | Payer: Medicare Other | Attending: Gastroenterology | Admitting: Gastroenterology

## 2020-03-03 ENCOUNTER — Other Ambulatory Visit: Payer: Self-pay

## 2020-03-03 DIAGNOSIS — Z8711 Personal history of peptic ulcer disease: Secondary | ICD-10-CM | POA: Diagnosis not present

## 2020-03-03 DIAGNOSIS — K921 Melena: Secondary | ICD-10-CM | POA: Insufficient documentation

## 2020-03-03 DIAGNOSIS — M797 Fibromyalgia: Secondary | ICD-10-CM | POA: Insufficient documentation

## 2020-03-03 DIAGNOSIS — F17210 Nicotine dependence, cigarettes, uncomplicated: Secondary | ICD-10-CM | POA: Insufficient documentation

## 2020-03-03 DIAGNOSIS — M1990 Unspecified osteoarthritis, unspecified site: Secondary | ICD-10-CM | POA: Diagnosis not present

## 2020-03-03 DIAGNOSIS — G43909 Migraine, unspecified, not intractable, without status migrainosus: Secondary | ICD-10-CM | POA: Diagnosis not present

## 2020-03-03 DIAGNOSIS — R195 Other fecal abnormalities: Secondary | ICD-10-CM | POA: Diagnosis not present

## 2020-03-03 DIAGNOSIS — M5137 Other intervertebral disc degeneration, lumbosacral region: Secondary | ICD-10-CM | POA: Diagnosis not present

## 2020-03-03 HISTORY — PX: ESOPHAGOGASTRODUODENOSCOPY (EGD) WITH PROPOFOL: SHX5813

## 2020-03-03 SURGERY — ESOPHAGOGASTRODUODENOSCOPY (EGD) WITH PROPOFOL
Anesthesia: Monitor Anesthesia Care

## 2020-03-03 MED ORDER — PROPOFOL 500 MG/50ML IV EMUL
INTRAVENOUS | Status: AC
  Filled 2020-03-03: qty 50

## 2020-03-03 MED ORDER — PROPOFOL 10 MG/ML IV BOLUS
INTRAVENOUS | Status: DC | PRN
  Administered 2020-03-03 (×3): 20 mg via INTRAVENOUS

## 2020-03-03 MED ORDER — LACTATED RINGERS IV SOLN
INTRAVENOUS | Status: DC
  Administered 2020-03-03: 1000 mL via INTRAVENOUS

## 2020-03-03 MED ORDER — SODIUM CHLORIDE 0.9 % IV SOLN: INTRAVENOUS | Status: DC

## 2020-03-03 MED ORDER — LIDOCAINE 2% (20 MG/ML) 5 ML SYRINGE
INTRAMUSCULAR | Status: DC | PRN
  Administered 2020-03-03: 40 mg via INTRAVENOUS

## 2020-03-03 MED ORDER — PROPOFOL 500 MG/50ML IV EMUL
INTRAVENOUS | Status: DC | PRN
  Administered 2020-03-03: 100 ug/kg/min via INTRAVENOUS

## 2020-03-03 SURGICAL SUPPLY — 14 items

## 2020-03-03 NOTE — Anesthesia Postprocedure Evaluation (Signed)
Anesthesia Post Note  Patient: Sharon Welch  Procedure(s) Performed: ESOPHAGOGASTRODUODENOSCOPY (EGD) WITH PROPOFOL (N/A )     Patient location during evaluation: PACU Anesthesia Type: MAC Level of consciousness: awake and alert Pain management: pain level controlled Vital Signs Assessment: post-procedure vital signs reviewed and stable Respiratory status: spontaneous breathing and respiratory function stable Cardiovascular status: stable Postop Assessment: no apparent nausea or vomiting Anesthetic complications: no   No complications documented.  Last Vitals:  Vitals:   03/03/20 0720 03/03/20 0730  BP: 121/72 137/78  Pulse: 84 88  Resp: 15 16  Temp:    SpO2: 100% 100%    Last Pain:  Vitals:   03/03/20 0730  TempSrc:   PainSc: 2                  Merlinda Frederick

## 2020-03-03 NOTE — Discharge Instructions (Signed)

## 2020-03-03 NOTE — Op Note (Signed)
[facility] [name] Procedure Date: 03/03/2020 MRN: [id number] Attending MD: Carol Ada , MD Date of Birth: [birth year] CSN: [id number] Age: 49 Admit Type: Outpatient Procedure:                Upper GI endoscopy Indications:              Heme positive stool, Melena Providers:                Carol Ada, MD, Doristine Johns, RN, William Dalton, Technician Referring MD:              Medicines:                 Complications:            No immediate complications. Estimated Blood Loss:     Estimated blood loss: none. Procedure:                Pre-Anesthesia Assessment:                           - Prior to the procedure, a History and Physical                            was performed, and patient medications and                            allergies were reviewed. The patient's tolerance of                            previous anesthesia was also reviewed. The risks                            and benefits of the procedure and the sedation                            options and risks were discussed with the patient.                            All questions were answered, and informed consent                            was obtained. Prior Anticoagulants: The patient has                            taken no previous anticoagulant or antiplatelet                            agents. ASA Grade Assessment: III - A patient with                            severe systemic disease. After reviewing the risks                            and  benefits, the patient was deemed in                            satisfactory condition to undergo the procedure.                           - Sedation was administered by an anesthesia                            professional. Deep sedation was attained.                           After obtaining informed consent, the endoscope was                            passed under direct vision. Throughout the                             procedure, the patient's blood pressure, pulse, and                            oxygen saturations were monitored continuously. The                            GIF-H190 ([id number]) Olympus gastroscope was                            introduced through the mouth, and advanced to the                            second part of duodenum. The upper GI endoscopy was                            accomplished without difficulty. The patient                            tolerated the procedure well. Scope In: Scope Out: Findings:      The esophagus was normal.      A large amount of food (residue) was found in the entire examined       stomach.      The examined duodenum was normal. Impression:               - Normal esophagus.                           - A large amount of food (residue) in the stomach.                           - Normal examined duodenum.                           - No specimens collected. Moderate Sedation:      Not Applicable - Patient had care per Anesthesia. Recommendation:           - Patient has a contact number available for  emergencies. The signs and symptoms of potential                            delayed complications were discussed with the                            patient. Return to normal activities tomorrow.                            Written discharge instructions were provided to the                            patient.                           - Resume previous diet.                           - Continue present medications.                           - Schedule colonoscopy. Procedure Code(s):        --- Professional ---                           [phone], Esophagogastroduodenoscopy, flexible,                            transoral; diagnostic, including collection of                            specimen(s) by brushing or washing, when performed                            (separate procedure) Diagnosis Code(s):        ---  Professional ---                           R19.5, Other fecal abnormalities                           K92.1, Melena (includes Hematochezia) CPT copyright 2019 American Medical Association. All rights reserved. The codes documented in this report are preliminary and upon coder review may  be revised to meet current compliance requirements. Carol Ada, MD Carol Ada, MD 03/03/2020 7:17:14 AM This report has been signed electronically. Number of Addenda: 0

## 2020-03-03 NOTE — Transfer of Care (Signed)
Immediate Anesthesia Transfer of Care Note  Patient: Sharon Welch  Procedure(s) Performed: ESOPHAGOGASTRODUODENOSCOPY (EGD) WITH PROPOFOL (N/A )  Patient Location: Endoscopy Unit  Anesthesia Type:MAC  Level of Consciousness: drowsy and patient cooperative  Airway & Oxygen Therapy: Patient Spontanous Breathing and Patient connected to face mask oxygen  Post-op Assessment: Report given to RN and Post -op Vital signs reviewed and stable  Post vital signs: Reviewed and stable  Last Vitals:  Vitals Value Taken Time  BP 104/72   Temp    Pulse 74   Resp 12   SpO2 100%     Last Pain:  Vitals:   03/03/20 2248  TempSrc: Oral  PainSc: 7          Complications: No complications documented.

## 2020-03-03 NOTE — H&P (Signed)
Sharon Welch HPI: The patient reports having intermittent melenic stools over the past 4-5 weeks and it was confirmed to be heme positive.  She has a history of a pyloric channel ulcer with bleeding last November.  The CBC in the office showed that her HGB was at 13 g/dL, but it is a drop down from 15 g/dL since the last blood draw.  Past Medical History:  Diagnosis Date   Bruising, spontaneous 12/08/2013   Chronic back pain    Chronic back pain    DDD (degenerative disc disease), lumbar    DDD (degenerative disc disease), lumbar    Fibromyalgia    Headache(784.0)    Ovarian cyst    Partial tear subscapularis tendon 12/27/2011   Scoliosis    Seizures ([place])    started 9/12-    Past Surgical History:  Procedure Laterality Date   ABDOMINAL HYSTERECTOMY  2012   BACK SURGERY  1989   scoliosis throsic-rods   CESAREAN SECTION     CHOLECYSTECTOMY N/A 11/17/2012   Procedure: LAPAROSCOPIC CHOLECYSTECTOMY WITH INTRAOPERATIVE CHOLANGIOGRAM;  Surgeon: Ralene Ok, MD;  Location: [place];  Service: General;  Laterality: N/A;   ENDOSCOPIC RETROGRADE CHOLANGIOPANCREATOGRAPHY (ERCP) WITH PROPOFOL N/A 06/11/2019   Procedure: ENDOSCOPIC RETROGRADE CHOLANGIOPANCREATOGRAPHY (ERCP) WITH PROPOFOL;  Surgeon: Carol Ada, MD;  [facility];  Service: Endoscopy;  Laterality: N/A;   ERCP N/A 05/07/2013   Procedure: ENDOSCOPIC RETROGRADE CHOLANGIOPANCREATOGRAPHY (ERCP);  Surgeon: Beryle Beams, MD;  Location: [facility];  Service: Endoscopy;  Laterality: N/A;  start ercp first, if canulation fails switch to EUS    ESOPHAGOGASTRODUODENOSCOPY (EGD) WITH PROPOFOL N/A 05/15/2019   Procedure: ESOPHAGOGASTRODUODENOSCOPY (EGD) WITH PROPOFOL;  Surgeon: Danie Binder, MD;  [facility];  Service: Endoscopy;  Laterality: N/A;   EUS N/A 11/03/2012   Procedure: UPPER ENDOSCOPIC ULTRASOUND (EUS) LINEAR;  Surgeon: Beryle Beams, MD;  Location: [facility];  Service: Endoscopy;   Laterality: N/A;   left arm     NECK SURGERY     REMOVAL OF STONES  06/11/2019   Procedure: REMOVAL OF STONES;  Surgeon: Carol Ada, MD;  Location: [facility];  Service: Endoscopy;;   TUBAL LIGATION     UPPER ESOPHAGEAL ENDOSCOPIC ULTRASOUND (EUS) N/A 06/11/2019   Procedure: UPPER ESOPHAGEAL ENDOSCOPIC ULTRASOUND (EUS);  Surgeon: Carol Ada, MD;  Location: [facility];  Service: Endoscopy;  Laterality: N/A;    Family History  Problem Relation Age of Onset   Hypertension Mother    Hypertension Father    Cancer Father        prostate   Heart disease Father    Hypertension Brother    Hypertension Brother     Social History:  reports that she has been smoking cigarettes. She has a 10.00 pack-year smoking history. She has never used smokeless tobacco. She reports that she does not drink alcohol and does not use drugs.  Allergies:  Allergies  Allergen Reactions   Coconut Flavor Swelling   Tramadol Other (See Comments)    Possible seizures   Aleve [Naproxen] Other (See Comments)    Ulcers   Pineapple Other (See Comments)    Migraine    Medications:  Scheduled:  Continuous:  sodium chloride     lactated ringers 125 mL/hr at 03/03/20 3354    Results for orders placed or performed during the hospital encounter of 03/02/20 (from the past 24 hour(s))  SARS CORONAVIRUS 2 (TAT 6-24 HRS) Nasopharyngeal Nasopharyngeal Swab     Status: None  Collection Time: 03/02/20  2:48 PM   Specimen: Nasopharyngeal Swab  Result Value Ref Range   SARS Coronavirus 2 NEGATIVE NEGATIVE     No results found.  ROS:  As stated above in the HPI otherwise negative.  Blood pressure (!) 141/83, pulse 82, temperature 97.9 F (36.6 C), temperature source Oral, resp. rate (!) 22, height 5' 0.5" (1.537 m), weight 43.1 kg, SpO2 100 %.    PE: Gen: NAD, Alert and Oriented HEENT:  Sharon Welch/AT, EOMI Neck: Supple, no LAD Lungs: CTA Bilaterally CV: RRR without M/G/R ABD: Soft, NTND,  +BS Ext: No C/C/E  Assessment/Plan: 1) Heme positive stool. 2) History of a bleeding pyloric channel ulcer. 3) Down trend in her HGB.  Plan: 1) EGD  Iana Buzan D 03/03/2020, 6:48 AM

## 2020-03-07 ENCOUNTER — Encounter (HOSPITAL_COMMUNITY): Payer: Self-pay | Admitting: Gastroenterology

## 2020-03-08 ENCOUNTER — Ambulatory Visit (HOSPITAL_COMMUNITY): Payer: Medicare Other

## 2020-03-08 ENCOUNTER — Encounter (HOSPITAL_COMMUNITY): Payer: Self-pay

## 2020-03-08 ENCOUNTER — Other Ambulatory Visit: Payer: Self-pay

## 2020-03-08 DIAGNOSIS — R2689 Other abnormalities of gait and mobility: Secondary | ICD-10-CM | POA: Diagnosis not present

## 2020-03-08 DIAGNOSIS — M6281 Muscle weakness (generalized): Secondary | ICD-10-CM | POA: Diagnosis not present

## 2020-03-08 DIAGNOSIS — R29898 Other symptoms and signs involving the musculoskeletal system: Secondary | ICD-10-CM

## 2020-03-08 DIAGNOSIS — M545 Low back pain, unspecified: Secondary | ICD-10-CM

## 2020-03-08 NOTE — Patient Instructions (Signed)
Bridge    Lie back, legs bent. Inhale, pressing hips up. Keeping ribs in, lengthen lower back. Exhale, rolling down along spine from top. Repeat 10 times. Do 2 sessions per day.  http://pm.exer.us/55   Copyright  VHI. All rights reserved.   

## 2020-03-08 NOTE — Therapy (Signed)
[place], [place], [address] Phone: [phone]   Fax:  [phone]  Physical Therapy Treatment  Patient Details  Name: Zo Loudon Ludington MRN: [id number] Date of Birth: 06-Feb-1971 Referring Provider (PT): Redmond [employer] MD   Encounter Date: 03/08/2020   PT End of Session - 03/08/20 9924    Visit Number 3    Number of Visits 12    Date for PT Re-Evaluation 04/05/20    Authorization Type Primary: medicare Secondary :medicaid of [place]    Progress Note Due on Visit 10    PT Start Time 1620    PT Stop Time 2683    PT Time Calculation (min) 39 min    Activity Tolerance Patient tolerated treatment well;Patient limited by pain;No increased pain    Behavior During Therapy WFL for tasks assessed/performed           Past Medical History:  Diagnosis Date  . Bruising, spontaneous 12/08/2013  . Chronic back pain   . Chronic back pain   . DDD (degenerative disc disease), lumbar   . DDD (degenerative disc disease), lumbar   . Fibromyalgia   . Headache(784.0)   . Ovarian cyst   . Partial tear subscapularis tendon 12/27/2011  . Scoliosis   . Seizures ([place])    started 9/12-    Past Surgical History:  Procedure Laterality Date  . ABDOMINAL HYSTERECTOMY  2012  . BACK SURGERY  1989   scoliosis throsic-rods  . CESAREAN SECTION    . CHOLECYSTECTOMY N/A 11/17/2012   Procedure: LAPAROSCOPIC CHOLECYSTECTOMY WITH INTRAOPERATIVE CHOLANGIOGRAM;  Surgeon: Ralene Ok, MD;  Location: [facility];  Service: General;  Laterality: N/A;  . ENDOSCOPIC RETROGRADE CHOLANGIOPANCREATOGRAPHY (ERCP) WITH PROPOFOL N/A 06/11/2019   Procedure: ENDOSCOPIC RETROGRADE CHOLANGIOPANCREATOGRAPHY (ERCP) WITH PROPOFOL;  Surgeon: Carol Ada, MD;  [facility];  Service: Endoscopy;  Laterality: N/A;  . ERCP N/A 05/07/2013   Procedure: ENDOSCOPIC RETROGRADE CHOLANGIOPANCREATOGRAPHY (ERCP);  Surgeon: Beryle Beams, MD;  Location: [facility];  Service:  Endoscopy;  Laterality: N/A;  start ercp first, if canulation fails switch to EUS   . ESOPHAGOGASTRODUODENOSCOPY (EGD) WITH PROPOFOL N/A 05/15/2019   Procedure: ESOPHAGOGASTRODUODENOSCOPY (EGD) WITH PROPOFOL;  Surgeon: Danie Binder, MD;  [facility];  Service: Endoscopy;  Laterality: N/A;  . ESOPHAGOGASTRODUODENOSCOPY (EGD) WITH PROPOFOL N/A 03/03/2020   Procedure: ESOPHAGOGASTRODUODENOSCOPY (EGD) WITH PROPOFOL;  Surgeon: Carol Ada, MD;  [facility];  Service: Endoscopy;  Laterality: N/A;  . EUS N/A 11/03/2012   Procedure: UPPER ENDOSCOPIC ULTRASOUND (EUS) LINEAR;  Surgeon: Beryle Beams, MD;  [facility];  Service: Endoscopy;  Laterality: N/A;  . left arm    . NECK SURGERY    . REMOVAL OF STONES  06/11/2019   Procedure: REMOVAL OF STONES;  Surgeon: Carol Ada, MD;  [facility];  Service: Endoscopy;;  . TUBAL LIGATION    . UPPER ESOPHAGEAL ENDOSCOPIC ULTRASOUND (EUS) N/A 06/11/2019   Procedure: UPPER ESOPHAGEAL ENDOSCOPIC ULTRASOUND (EUS);  Surgeon: Carol Ada, MD;  Location: [facility];  Service: Endoscopy;  Laterality: N/A;    There were no vitals filed for this visit.   Subjective Assessment - 03/08/20 1624    Subjective Pt stated on 02/25/20 she had a full blown seizure in her sleep then had another later that day.  Reports LBP pain scale 6/10 currently,    Patient Stated Goals love to be able to move better and come off her pain medicine    Currently  in Pain? Yes    Pain Score 6     Pain Location Back    Pain Orientation Lower    Pain Descriptors / Indicators Constant;Sharp;Stabbing    Pain Type Chronic pain    Pain Onset More than a month ago    Pain Frequency Constant    Aggravating Factors  anything    Pain Relieving Factors nothing    Effect of Pain on Daily Activities limits                             [facility] Adult PT Treatment/Exercise - 03/08/20 0001      Exercises   Exercises Lumbar       Lumbar Exercises: Stretches   Lower Trunk Rotation 5 reps;10 seconds      Lumbar Exercises: Supine   Ab Set 10 reps;5 seconds    Clam 10 reps;3 seconds    Clam Limitations w/ RTB    Bridge 10 reps;3 seconds    Other Supine Lumbar Exercises marching with ab set x10    Other Supine Lumbar Exercises deep breathing 10 deep breaths      Lumbar Exercises: Prone   Straight Leg Raise 10 reps;3 seconds    Straight Leg Raises Limitations Rt LE AAROM    Other Prone Lumbar Exercises heel squeeze 5x 5"    Other Prone Lumbar Exercises Rt knee flexed, heel to ceilling with AAROM                    PT Short Term Goals - 02/23/20 1614      PT SHORT TERM GOAL #1   Title Patient will be independent with HEP in order to improve functional outcomes.    Time 3    Period Weeks    Status New    Target Date 03/15/20      PT SHORT TERM GOAL #2   Title Patient will report at least 25% improvement in symptoms for improved quality of life.    Time 3    Period Weeks    Status New    Target Date 03/01/20             PT Long Term Goals - 02/23/20 1615      PT LONG TERM GOAL #1   Title Patient will report at least 75% improvement in symptoms for improved quality of life.    Time 6    Period Weeks    Status New    Target Date 04/05/20      PT LONG TERM GOAL #2   Title Patient will improve FOTO score by at least 5 points in order to indicate improved tolerance to activity.    Time 6    Period Weeks    Status New    Target Date 04/05/20      PT LONG TERM GOAL #3   Title Patient will report pain no greater than 3/10 over the course of 1 week in her back for improved tolerance to activity.    Time 6    Period Weeks    Status New    Target Date 04/05/20                 Plan - 03/08/20 1659    Clinical Impression Statement Session focus with core and hip strengthening with additional stretches to address lumbar mobility.  Pt demonstrates exteme gluteal weakness with  inability to complete prone SLR.  Added gluteal strengthening exercises and deep breathing technqiues to calm CNS.  EOS pt reports pain scale remains at 6/10.  Reviewed current HEP with additional bridges for strengthening, able to verbalize and demonstrate appropriate mechanics with no reports of increased pain.    PT Home Exercise Plan 9/1 standing hip extension; 02/25/20 - ab set, march, heel slides, supine clam with RTB; 9/15: bridge           Patient will benefit from skilled therapeutic intervention in order to improve the following deficits and impairments:     Visit Diagnosis: Low back pain, unspecified back pain laterality, unspecified chronicity, unspecified whether sciatica present  Muscle weakness (generalized)  Other abnormalities of gait and mobility  Other symptoms and signs involving the musculoskeletal system     Problem List Patient Active Problem List   Diagnosis Date Noted  . Acute GI bleed due to bleeding pyloric/gastric ulcer 05/15/2019  . PUD (peptic ulcer disease)-EGD 05/15/2019 with bleeding pyloric/gastric ulcer, nonbleeding duodenal ulcers and gastritis   . Hematemesis with nausea   . Migraine headache 09/25/2015  . 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 Bilateral occipital neuralgia 01/02/2015  . Migraine 01/02/2015  . 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 DDD (degenerative disc disease), cervical 10/27/2014  . 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 DDD (degenerative disc disease), lumbosacral 10/27/2014  . Partial tear subscapularis tendon 12/27/2011   Ihor Austin, LPTA/CLT; CBIS [phone]  Aldona Lento 03/08/2020, 7:03 PM  [place] [address] [place], [place], [address] Phone: [phone]   Fax:  [phone]  Name: Jalyn Dutta Yglesias MRN: [id number] Date of Birth: 11-Jan-1971

## 2020-03-14 ENCOUNTER — Telehealth (HOSPITAL_COMMUNITY): Payer: Self-pay | Admitting: Physical Therapy

## 2020-03-14 ENCOUNTER — Encounter (HOSPITAL_COMMUNITY): Payer: Medicare Other | Admitting: Physical Therapy

## 2020-03-14 NOTE — Telephone Encounter (Signed)
Patient no show for appointment because she woke up sick today. Patient called front office and PT called patient as well to check in with patient and remind her of next appointment. Patient instructed to cancel if she is still not feeling well.   2:13 PM, 03/14/20 Mearl Latin PT, DPT Physical Therapist at [facility]

## 2020-03-16 ENCOUNTER — Other Ambulatory Visit: Payer: Self-pay

## 2020-03-16 ENCOUNTER — Encounter (HOSPITAL_COMMUNITY): Payer: Self-pay | Admitting: Physical Therapy

## 2020-03-16 ENCOUNTER — Ambulatory Visit (HOSPITAL_COMMUNITY): Payer: Medicare Other | Admitting: Physical Therapy

## 2020-03-16 DIAGNOSIS — M6281 Muscle weakness (generalized): Secondary | ICD-10-CM | POA: Diagnosis not present

## 2020-03-16 DIAGNOSIS — R29898 Other symptoms and signs involving the musculoskeletal system: Secondary | ICD-10-CM

## 2020-03-16 DIAGNOSIS — M545 Low back pain, unspecified: Secondary | ICD-10-CM

## 2020-03-16 DIAGNOSIS — R2689 Other abnormalities of gait and mobility: Secondary | ICD-10-CM

## 2020-03-16 NOTE — Therapy (Signed)
[place], [place], [address] Phone: [phone]   Fax:  [phone]  Physical Therapy Treatment  Patient Details  Name: Sharon Welch MRN: [id number] Date of Birth: 03/26/1971 Referring Provider (PT): Redmond [employer] MD   Encounter Date: 03/16/2020   PT End of Session - 03/16/20 1315    Visit Number 4    Number of Visits 12    Date for PT Re-Evaluation 04/05/20    Authorization Type Primary: medicare Secondary :medicaid of [place]    Progress Note Due on Visit 10    PT Start Time 4650    PT Stop Time 3546    PT Time Calculation (min) 40 min    Activity Tolerance Patient tolerated treatment well;Patient limited by pain;No increased pain    Behavior During Therapy WFL for tasks assessed/performed           Past Medical History:  Diagnosis Date  . Bruising, spontaneous 12/08/2013  . Chronic back pain   . Chronic back pain   . DDD (degenerative disc disease), lumbar   . DDD (degenerative disc disease), lumbar   . Fibromyalgia   . Headache(784.0)   . Ovarian cyst   . Partial tear subscapularis tendon 12/27/2011  . Scoliosis   . Seizures ([place])    started 9/12-    Past Surgical History:  Procedure Laterality Date  . ABDOMINAL HYSTERECTOMY  2012  . BACK SURGERY  1989   scoliosis throsic-rods  . CESAREAN SECTION    . CHOLECYSTECTOMY N/A 11/17/2012   Procedure: LAPAROSCOPIC CHOLECYSTECTOMY WITH INTRAOPERATIVE CHOLANGIOGRAM;  Surgeon: Ralene Ok, MD;  Location: [facility];  Service: General;  Laterality: N/A;  . ENDOSCOPIC RETROGRADE CHOLANGIOPANCREATOGRAPHY (ERCP) WITH PROPOFOL N/A 06/11/2019   Procedure: ENDOSCOPIC RETROGRADE CHOLANGIOPANCREATOGRAPHY (ERCP) WITH PROPOFOL;  Surgeon: Carol Ada, MD;  [facility];  Service: Endoscopy;  Laterality: N/A;  . ERCP N/A 05/07/2013   Procedure: ENDOSCOPIC RETROGRADE CHOLANGIOPANCREATOGRAPHY (ERCP);  Surgeon: Beryle Beams, MD;  Location: [facility];  Service:  Endoscopy;  Laterality: N/A;  start ercp first, if canulation fails switch to EUS   . ESOPHAGOGASTRODUODENOSCOPY (EGD) WITH PROPOFOL N/A 05/15/2019   Procedure: ESOPHAGOGASTRODUODENOSCOPY (EGD) WITH PROPOFOL;  Surgeon: Danie Binder, MD;  [facility];  Service: Endoscopy;  Laterality: N/A;  . ESOPHAGOGASTRODUODENOSCOPY (EGD) WITH PROPOFOL N/A 03/03/2020   Procedure: ESOPHAGOGASTRODUODENOSCOPY (EGD) WITH PROPOFOL;  Surgeon: Carol Ada, MD;  [facility];  Service: Endoscopy;  Laterality: N/A;  . EUS N/A 11/03/2012   Procedure: UPPER ENDOSCOPIC ULTRASOUND (EUS) LINEAR;  Surgeon: Beryle Beams, MD;  [facility];  Service: Endoscopy;  Laterality: N/A;  . left arm    . NECK SURGERY    . REMOVAL OF STONES  06/11/2019   Procedure: REMOVAL OF STONES;  Surgeon: Carol Ada, MD;  [facility];  Service: Endoscopy;;  . TUBAL LIGATION    . UPPER ESOPHAGEAL ENDOSCOPIC ULTRASOUND (EUS) N/A 06/11/2019   Procedure: UPPER ESOPHAGEAL ENDOSCOPIC ULTRASOUND (EUS);  Surgeon: Carol Ada, MD;  Location: [facility];  Service: Endoscopy;  Laterality: N/A;    There were no vitals filed for this visit.   Subjective Assessment - 03/16/20 1315    Subjective Patient states her back has been alright. The seizures usually take a lot out of her. Her exercises have been going well.    Patient Stated Goals love to be able to move better and come off her pain medicine    Currently in Pain? Yes  Pain Score 6     Pain Location Back    Pain Orientation Lower    Pain Onset More than a month ago                             [facility] Adult PT Treatment/Exercise - 03/16/20 0001      Lumbar Exercises: Standing   Row 10 reps;Both    Theraband Level (Row) Level 3 (Green)    Row Limitations 3 sets    Shoulder Extension 10 reps;Both    Theraband Level (Shoulder Extension) Level 3 (Green)    Shoulder Extension Limitations 2 sets      Lumbar Exercises:  Supine   Ab Set 5 reps;5 seconds    Bent Knee Raise 10 reps    Bent Knee Raise Limitations 3 sets with ab sets    Bridge 10 reps;3 seconds    Other Supine Lumbar Exercises ab iso with ball 10x 3 second holds      Lumbar Exercises: Sidelying   Clam 15 reps    Clam Limitations 1 set bilateral       Lumbar Exercises: Prone   Straight Leg Raise 10 reps;3 seconds    Other Prone Lumbar Exercises heel squeeze 10x 5"                  PT Education - 03/16/20 1315    Education Details Patient educated on HEP, exercise mechanics    Person(s) Educated Patient    Methods Explanation;Demonstration    Comprehension Verbalized understanding;Returned demonstration            PT Short Term Goals - 02/23/20 1614      PT SHORT TERM GOAL #1   Title Patient will be independent with HEP in order to improve functional outcomes.    Time 3    Period Weeks    Status New    Target Date 03/15/20      PT SHORT TERM GOAL #2   Title Patient will report at least 25% improvement in symptoms for improved quality of life.    Time 3    Period Weeks    Status New    Target Date 03/01/20             PT Long Term Goals - 02/23/20 1615      PT LONG TERM GOAL #1   Title Patient will report at least 75% improvement in symptoms for improved quality of life.    Time 6    Period Weeks    Status New    Target Date 04/05/20      PT LONG TERM GOAL #2   Title Patient will improve FOTO score by at least 5 points in order to indicate improved tolerance to activity.    Time 6    Period Weeks    Status New    Target Date 04/05/20      PT LONG TERM GOAL #3   Title Patient will report pain no greater than 3/10 over the course of 1 week in her back for improved tolerance to activity.    Time 6    Period Weeks    Status New    Target Date 04/05/20                 Plan - 03/16/20 1316    Clinical Impression Statement Patient has slight pain with supine lumbar exercises today but she is  able  to complete with good form and mechanics. Patient fatigues quickly with dead bug isometric exercise with ball today. Patient states she feels weaker on her L side of her body today during exercises. Patient completes prone hip extension today in limited range secondary to glute weakness but does show good glute activation and improving strength compared to prior sessions. Patient tolerates postural resistance exercises well without increase in symptoms. Patient will continue to benefit from skilled physical therapy in order to reduce impairment and improve function.    Personal Factors and Comorbidities Comorbidity 3+;Past/Current Experience;Time since onset of injury/illness/exacerbation;Fitness;Age    Comorbidities Chronic LBP, scolosis, neurological disease, chronic knee pain    Examination-Activity Limitations Bed Mobility;Bend;Locomotion Level;Transfers;Stairs;Stand;Toileting;Squat;Sleep;Dressing;Carry    Examination-Participation Restrictions Cleaning;Community Activity;[employer] Work;Meal Prep;Shop;Volunteer;Laundry    Rehab Potential Fair    PT Frequency 2x / week    PT Duration 6 weeks    PT Treatment/Interventions ADLs/Self Care Home Management;Aquatic Therapy;Cryotherapy;Electrical Stimulation;Biofeedback;Moist Heat;Traction;Ultrasound;Parrafin;Fluidtherapy;Contrast Bath;DME Instruction;Gait training;Functional mobility training;Stair training;Therapeutic activities;Therapeutic exercise;Balance training;Neuromuscular re-education;Patient/family education;Orthotic Fit/Training;Manual techniques;Manual lymph drainage;Compression bandaging;Scar mobilization;Passive range of motion;Dry needling;Energy conservation;Splinting;Taping;Vasopneumatic Device;Vestibular;Spinal Manipulations;Joint Manipulations    PT Next Visit Plan continue core strengthening and progress as tolerated; hip extensor strengthening, hip hinging, possibly begin manual for desentization/pain    PT Home Exercise Plan 9/1 standing  hip extension; 02/25/20 - ab set, march, heel slides, supine clam with RTB; 9/15: bridge           Patient will benefit from skilled therapeutic intervention in order to improve the following deficits and impairments:  Decreased range of motion, Decreased endurance, Increased muscle spasms, Decreased activity tolerance, Decreased knowledge of use of DME, Decreased balance, Pain, Decreased mobility, Decreased strength, Improper body mechanics, Impaired flexibility, Hypomobility, Difficulty walking, Postural dysfunction  Visit Diagnosis: Low back pain, unspecified back pain laterality, unspecified chronicity, unspecified whether sciatica present  Muscle weakness (generalized)  Other abnormalities of gait and mobility  Other symptoms and signs involving the musculoskeletal system     Problem List Patient Active Problem List   Diagnosis Date Noted  . Acute GI bleed due to bleeding pyloric/gastric ulcer 05/15/2019  . PUD (peptic ulcer disease)-EGD 05/15/2019 with bleeding pyloric/gastric ulcer, nonbleeding duodenal ulcers and gastritis   . Hematemesis with nausea   . Migraine headache 09/25/2015  . 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 Bilateral occipital neuralgia 01/02/2015  . Migraine 01/02/2015  . 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 DDD (degenerative disc disease), cervical 10/27/2014  . 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 DDD (degenerative disc disease), lumbosacral 10/27/2014  . Partial tear subscapularis tendon 12/27/2011   1:58 PM, 03/16/20 Mearl Latin PT, DPT Physical Therapist at [facility] [place], [place], [address] Phone: [phone]   Fax:  [phone]  Name: Sharon Welch MRN: [id number] Date of Birth: 1971/04/02

## 2020-03-21 ENCOUNTER — Ambulatory Visit (HOSPITAL_COMMUNITY): Payer: Medicare Other | Admitting: Physical Therapy

## 2020-03-21 ENCOUNTER — Other Ambulatory Visit: Payer: Self-pay

## 2020-03-21 DIAGNOSIS — R2689 Other abnormalities of gait and mobility: Secondary | ICD-10-CM

## 2020-03-21 DIAGNOSIS — R29898 Other symptoms and signs involving the musculoskeletal system: Secondary | ICD-10-CM | POA: Diagnosis not present

## 2020-03-21 DIAGNOSIS — M545 Low back pain, unspecified: Secondary | ICD-10-CM

## 2020-03-21 DIAGNOSIS — M6281 Muscle weakness (generalized): Secondary | ICD-10-CM

## 2020-03-21 NOTE — Therapy (Signed)
[place], [place], [address] Phone: [phone]   Fax:  [phone]  Physical Therapy Treatment  Patient Details  Name: Sharon Welch Date of Birth: 1971/03/11 Referring Provider (PT): Redmond School MD   Encounter Date: 03/21/2020   PT End of Session - 03/21/20 1608    Visit Number 5    Number of Visits 12    Date for PT Re-Evaluation 04/05/20    Authorization Type Primary: medicare Secondary :medicaid of     Progress Note Due on Visit 10    PT Start Time 3664    PT Stop Time 4034    PT Time Calculation (min) 40 min    Activity Tolerance Patient tolerated treatment well;Patient limited by pain;No increased pain    Behavior During Therapy WFL for tasks assessed/performed           Past Medical History:  Diagnosis Date  . Bruising, spontaneous 12/08/2013  . Chronic back pain   . Chronic back pain   . DDD (degenerative disc disease), lumbar   . DDD (degenerative disc disease), lumbar   . Fibromyalgia   . Headache(784.0)   . Ovarian cyst   . Partial tear subscapularis tendon 12/27/2011  . Scoliosis   . Seizures ([place])    started 9/12-    Past Surgical History:  Procedure Laterality Date  . ABDOMINAL HYSTERECTOMY  2012  . BACK SURGERY  1989   scoliosis throsic-rods  . CESAREAN SECTION    . CHOLECYSTECTOMY N/A 11/17/2012   Procedure: LAPAROSCOPIC CHOLECYSTECTOMY WITH INTRAOPERATIVE CHOLANGIOGRAM;  Surgeon: Ralene Ok, MD;  Location: [facility];  Service: General;  Laterality: N/A;  . ENDOSCOPIC RETROGRADE CHOLANGIOPANCREATOGRAPHY (ERCP) WITH PROPOFOL N/A 06/11/2019   Procedure: ENDOSCOPIC RETROGRADE CHOLANGIOPANCREATOGRAPHY (ERCP) WITH PROPOFOL;  Surgeon: Carol Ada, MD;  [facility];  Service: Endoscopy;  Laterality: N/A;  . ERCP N/A 05/07/2013   Procedure: ENDOSCOPIC RETROGRADE CHOLANGIOPANCREATOGRAPHY (ERCP);  Surgeon: Beryle Beams, MD;  Location: [facility];  Service:  Endoscopy;  Laterality: N/A;  start ercp first, if canulation fails switch to EUS   . ESOPHAGOGASTRODUODENOSCOPY (EGD) WITH PROPOFOL N/A 05/15/2019   Procedure: ESOPHAGOGASTRODUODENOSCOPY (EGD) WITH PROPOFOL;  Surgeon: Danie Binder, MD;  [facility];  Service: Endoscopy;  Laterality: N/A;  . ESOPHAGOGASTRODUODENOSCOPY (EGD) WITH PROPOFOL N/A 03/03/2020   Procedure: ESOPHAGOGASTRODUODENOSCOPY (EGD) WITH PROPOFOL;  Surgeon: Carol Ada, MD;  [facility];  Service: Endoscopy;  Laterality: N/A;  . EUS N/A 11/03/2012   Procedure: UPPER ENDOSCOPIC ULTRASOUND (EUS) LINEAR;  Surgeon: Beryle Beams, MD;  [facility];  Service: Endoscopy;  Laterality: N/A;  . left arm    . NECK SURGERY    . REMOVAL OF STONES  06/11/2019   Procedure: REMOVAL OF STONES;  Surgeon: Carol Ada, MD;  [facility];  Service: Endoscopy;;  . TUBAL LIGATION    . UPPER ESOPHAGEAL ENDOSCOPIC ULTRASOUND (EUS) N/A 06/11/2019   Procedure: UPPER ESOPHAGEAL ENDOSCOPIC ULTRASOUND (EUS);  Surgeon: Carol Ada, MD;  Location: [facility];  Service: Endoscopy;  Laterality: N/A;    There were no vitals filed for this visit.   Subjective Assessment - 03/21/20 1537    Subjective pt reports 6/10 pain today.    Currently in Pain? Yes    Pain Score 6     Pain Location Back    Pain Orientation Lower    Pain Descriptors / Indicators Aching  [facility] Adult PT Treatment/Exercise - 03/21/20 0001      Lumbar Exercises: Stretches   Lower Trunk Rotation 5 reps;10 seconds      Lumbar Exercises: Standing   Heel Raises 20 reps    Forward Lunge 10 reps;Limitations    Forward Lunge Limitations 2 sets not UE's    Row 10 reps;Both    Theraband Level (Row) Level 3 (Green)    Row Limitations 2 sets    Shoulder Extension 10 reps;Both    Theraband Level (Shoulder Extension) Level 3 (Green)    Shoulder Extension Limitations 2 sets      Lumbar  Exercises: Supine   Bridge 3 seconds;20 reps      Lumbar Exercises: Sidelying   Clam 20 reps    Clam Limitations bilateral                     PT Short Term Goals - 02/23/20 1614      PT SHORT TERM GOAL #1   Title Patient will be independent with HEP in order to improve functional outcomes.    Time 3    Period Weeks    Status New    Target Date 03/15/20      PT SHORT TERM GOAL #2   Title Patient will report at least 25% improvement in symptoms for improved quality of life.    Time 3    Period Weeks    Status New    Target Date 03/01/20             PT Long Term Goals - 02/23/20 1615      PT LONG TERM GOAL #1   Title Patient will report at least 75% improvement in symptoms for improved quality of life.    Time 6    Period Weeks    Status New    Target Date 04/05/20      PT LONG TERM GOAL #2   Title Patient will improve FOTO score by at least 5 points in order to indicate improved tolerance to activity.    Time 6    Period Weeks    Status New    Target Date 04/05/20      PT LONG TERM GOAL #3   Title Patient will report pain no greater than 3/10 over the course of 1 week in her back for improved tolerance to activity.    Time 6    Period Weeks    Status New    Target Date 04/05/20                 Plan - 03/21/20 1608    Clinical Impression Statement Continued with core and LE strengthening this session.  Began standing lunges without UE assist with good control and cues for form.  Most exercises completed in 20 reps or 2 sets of 10 reps.  Pt able to complete all exercises and full repetitions without c/o pain or signs of disfunction.    Personal Factors and Comorbidities Comorbidity 3+;Past/Current Experience;Time since onset of injury/illness/exacerbation;Fitness;Age    Comorbidities Chronic LBP, scolosis, neurological disease, chronic knee pain    Examination-Activity Limitations Bed Mobility;Bend;Locomotion  Level;Transfers;Stairs;Stand;Toileting;Squat;Sleep;Dressing;Carry    Examination-Participation Restrictions Cleaning;Community Activity;[employer] Work;Meal Prep;Shop;Volunteer;Laundry    Rehab Potential Fair    PT Frequency 2x / week    PT Duration 6 weeks    PT Treatment/Interventions ADLs/Self Care Home Management;Aquatic Therapy;Cryotherapy;Electrical Stimulation;Biofeedback;Moist Heat;Traction;Ultrasound;Parrafin;Fluidtherapy;Contrast Bath;DME Instruction;Gait training;Functional mobility training;Stair training;Therapeutic activities;Therapeutic exercise;Balance training;Neuromuscular re-education;Patient/family education;Orthotic Fit/Training;Manual techniques;Manual lymph  drainage;Compression bandaging;Scar mobilization;Passive range of motion;Dry needling;Energy conservation;Splinting;Taping;Vasopneumatic Device;Vestibular;Spinal Manipulations;Joint Manipulations    PT Next Visit Plan continue core strengthening and progress as tolerated; hip extensor strengthening, hip hinging, possibly begin manual for desentization/pain if needed.    PT Home Exercise Plan 9/1 standing hip extension; 02/25/20 - ab set, march, heel slides, supine clam with RTB; 9/15: bridge           Patient will benefit from skilled therapeutic intervention in order to improve the following deficits and impairments:  Decreased range of motion, Decreased endurance, Increased muscle spasms, Decreased activity tolerance, Decreased knowledge of use of DME, Decreased balance, Pain, Decreased mobility, Decreased strength, Improper body mechanics, Impaired flexibility, Hypomobility, Difficulty walking, Postural dysfunction  Visit Diagnosis: Low back pain, unspecified back pain laterality, unspecified chronicity, unspecified whether sciatica present  Muscle weakness (generalized)  Other abnormalities of gait and mobility     Problem List Patient Active Problem List   Diagnosis Date Noted  . Acute GI bleed due to bleeding  pyloric/gastric ulcer 05/15/2019  . PUD (peptic ulcer disease)-EGD 05/15/2019 with bleeding pyloric/gastric ulcer, nonbleeding duodenal ulcers and gastritis   . Hematemesis with nausea   . Migraine headache 09/25/2015  . 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 Bilateral occipital neuralgia 01/02/2015  . Migraine 01/02/2015  . 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 DDD (degenerative disc disease), cervical 10/27/2014  . 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 DDD (degenerative disc disease), lumbosacral 10/27/2014  . Partial tear subscapularis tendon 12/27/2011   Teena Irani, PTA/CLT [phone]  Teena Irani 03/21/2020, 4:17 PM  [facility] [address] [place], [place], [address] Phone: [phone]   Fax:  [phone]  Name: Sharon Welch Date of Birth: Dec 12, 1970

## 2020-03-23 ENCOUNTER — Other Ambulatory Visit: Payer: Self-pay

## 2020-03-23 ENCOUNTER — Ambulatory Visit (HOSPITAL_COMMUNITY): Payer: Medicare Other | Admitting: Physical Therapy

## 2020-03-23 DIAGNOSIS — R2689 Other abnormalities of gait and mobility: Secondary | ICD-10-CM

## 2020-03-23 DIAGNOSIS — R29898 Other symptoms and signs involving the musculoskeletal system: Secondary | ICD-10-CM | POA: Diagnosis not present

## 2020-03-23 DIAGNOSIS — M6281 Muscle weakness (generalized): Secondary | ICD-10-CM | POA: Diagnosis not present

## 2020-03-23 DIAGNOSIS — M545 Low back pain, unspecified: Secondary | ICD-10-CM

## 2020-03-23 NOTE — Therapy (Signed)
[place], [place], [address] Phone: [phone]   Fax:  [phone]  Physical Therapy Treatment  Patient Details  Name: Sharon Welch MRN: [id number] Date of Birth: 04-28-1971 Referring Provider (PT): Redmond [employer] MD   Encounter Date: 03/23/2020   PT End of Session - 03/23/20 1456    Visit Number 6    Number of Visits 12    Date for PT Re-Evaluation 04/05/20    Authorization Type Primary: medicare Secondary :medicaid of [place]    Progress Note Due on Visit 10    PT Start Time 1320    PT Stop Time 1400    PT Time Calculation (min) 40 min    Activity Tolerance Patient tolerated treatment well;Patient limited by pain;No increased pain    Behavior During Therapy WFL for tasks assessed/performed           Past Medical History:  Diagnosis Date  . Bruising, spontaneous 12/08/2013  . Chronic back pain   . Chronic back pain   . DDD (degenerative disc disease), lumbar   . DDD (degenerative disc disease), lumbar   . Fibromyalgia   . Headache(784.0)   . Ovarian cyst   . Partial tear subscapularis tendon 12/27/2011  . Scoliosis   . Seizures ([place])    started 9/12-    Past Surgical History:  Procedure Laterality Date  . ABDOMINAL HYSTERECTOMY  2012  . BACK SURGERY  1989   scoliosis throsic-rods  . CESAREAN SECTION    . CHOLECYSTECTOMY N/A 11/17/2012   Procedure: LAPAROSCOPIC CHOLECYSTECTOMY WITH INTRAOPERATIVE CHOLANGIOGRAM;  Surgeon: Ralene Ok, MD;  Location: [facility];  Service: General;  Laterality: N/A;  . ENDOSCOPIC RETROGRADE CHOLANGIOPANCREATOGRAPHY (ERCP) WITH PROPOFOL N/A 06/11/2019   Procedure: ENDOSCOPIC RETROGRADE CHOLANGIOPANCREATOGRAPHY (ERCP) WITH PROPOFOL;  Surgeon: Carol Ada, MD;  [facility];  Service: Endoscopy;  Laterality: N/A;  . ERCP N/A 05/07/2013   Procedure: ENDOSCOPIC RETROGRADE CHOLANGIOPANCREATOGRAPHY (ERCP);  Surgeon: Beryle Beams, MD;  Location: [facility];  Service:  Endoscopy;  Laterality: N/A;  start ercp first, if canulation fails switch to EUS   . ESOPHAGOGASTRODUODENOSCOPY (EGD) WITH PROPOFOL N/A 05/15/2019   Procedure: ESOPHAGOGASTRODUODENOSCOPY (EGD) WITH PROPOFOL;  Surgeon: Danie Binder, MD;  [facility];  Service: Endoscopy;  Laterality: N/A;  . ESOPHAGOGASTRODUODENOSCOPY (EGD) WITH PROPOFOL N/A 03/03/2020   Procedure: ESOPHAGOGASTRODUODENOSCOPY (EGD) WITH PROPOFOL;  Surgeon: Carol Ada, MD;  [facility];  Service: Endoscopy;  Laterality: N/A;  . EUS N/A 11/03/2012   Procedure: UPPER ENDOSCOPIC ULTRASOUND (EUS) LINEAR;  Surgeon: Beryle Beams, MD;  [facility];  Service: Endoscopy;  Laterality: N/A;  . left arm    . NECK SURGERY    . REMOVAL OF STONES  06/11/2019   Procedure: REMOVAL OF STONES;  Surgeon: Carol Ada, MD;  [facility];  Service: Endoscopy;;  . TUBAL LIGATION    . UPPER ESOPHAGEAL ENDOSCOPIC ULTRASOUND (EUS) N/A 06/11/2019   Procedure: UPPER ESOPHAGEAL ENDOSCOPIC ULTRASOUND (EUS);  Surgeon: Carol Ada, MD;  Location: [facility];  Service: Endoscopy;  Laterality: N/A;    There were no vitals filed for this visit.   Subjective Assessment - 03/23/20 1457    Subjective Pt states she is doing well today without issues.    Currently in Pain? Yes    Pain Score 5     Pain Location Back    Pain Orientation Lower    Pain Descriptors / Indicators Aching  [facility] Adult PT Treatment/Exercise - 03/23/20 0001      Lumbar Exercises: Stretches   Lower Trunk Rotation 5 reps;10 seconds      Lumbar Exercises: Standing   Heel Raises 20 reps    Functional Squats 15 reps    Functional Squats Limitations cues for form    Forward Lunge 10 reps;Limitations    Forward Lunge Limitations 2 sets not UE's    Other Standing Lumbar Exercises hip abduction and extension 15 reps X 2 sets each      Lumbar Exercises: Supine   Bridge 3 seconds;20 reps                     PT Short Term Goals - 02/23/20 1614      PT SHORT TERM GOAL #1   Title Patient will be independent with HEP in order to improve functional outcomes.    Time 3    Period Weeks    Status New    Target Date 03/15/20      PT SHORT TERM GOAL #2   Title Patient will report at least 25% improvement in symptoms for improved quality of life.    Time 3    Period Weeks    Status New    Target Date 03/01/20             PT Long Term Goals - 02/23/20 1615      PT LONG TERM GOAL #1   Title Patient will report at least 75% improvement in symptoms for improved quality of life.    Time 6    Period Weeks    Status New    Target Date 04/05/20      PT LONG TERM GOAL #2   Title Patient will improve FOTO score by at least 5 points in order to indicate improved tolerance to activity.    Time 6    Period Weeks    Status New    Target Date 04/05/20      PT LONG TERM GOAL #3   Title Patient will report pain no greater than 3/10 over the course of 1 week in her back for improved tolerance to activity.    Time 6    Period Weeks    Status New    Target Date 04/05/20                 Plan - 03/23/20 1456    Clinical Impression Statement Continued to focus oi improving LE strength and core stability.  Added standing hip abduction, extension and functional squats to POC.  Pt with verbal cues for form with all.   Continued with other stab exercises and required less cues this session to complete in correct form.  Pt without complaints during or after session today.  Overall improving    Personal Factors and Comorbidities Comorbidity 3+;Past/Current Experience;Time since onset of injury/illness/exacerbation;Fitness;Age    Comorbidities Chronic LBP, scolosis, neurological disease, chronic knee pain    Examination-Activity Limitations Bed Mobility;Bend;Locomotion Level;Transfers;Stairs;Stand;Toileting;Squat;Sleep;Dressing;Carry    Examination-Participation  Restrictions Cleaning;Community Activity;[employer] Work;Meal Prep;Shop;Volunteer;Laundry    Rehab Potential Fair    PT Frequency 2x / week    PT Duration 6 weeks    PT Treatment/Interventions ADLs/Self Care Home Management;Aquatic Therapy;Cryotherapy;Electrical Stimulation;Biofeedback;Moist Heat;Traction;Ultrasound;Parrafin;Fluidtherapy;Contrast Bath;DME Instruction;Gait training;Functional mobility training;Stair training;Therapeutic activities;Therapeutic exercise;Balance training;Neuromuscular re-education;Patient/family education;Orthotic Fit/Training;Manual techniques;Manual lymph drainage;Compression bandaging;Scar mobilization;Passive range of motion;Dry needling;Energy conservation;Splinting;Taping;Vasopneumatic Device;Vestibular;Spinal Manipulations;Joint Manipulations    PT Next Visit Plan continue core strengthening and progress as tolerated; hip  extensor strengthening, hip hinging, possibly begin manual for desentization/pain if needed.    PT Home Exercise Plan 9/1 standing hip extension; 02/25/20 - ab set, march, heel slides, supine clam with RTB; 9/15: bridge           Patient will benefit from skilled therapeutic intervention in order to improve the following deficits and impairments:  Decreased range of motion, Decreased endurance, Increased muscle spasms, Decreased activity tolerance, Decreased knowledge of use of DME, Decreased balance, Pain, Decreased mobility, Decreased strength, Improper body mechanics, Impaired flexibility, Hypomobility, Difficulty walking, Postural dysfunction  Visit Diagnosis: Muscle weakness (generalized)  Low back pain, unspecified back pain laterality, unspecified chronicity, unspecified whether sciatica present  Other abnormalities of gait and mobility  Other symptoms and signs involving the musculoskeletal system     Problem List Patient Active Problem List   Diagnosis Date Noted  . Acute GI bleed due to bleeding pyloric/gastric ulcer 05/15/2019  .  PUD (peptic ulcer disease)-EGD 05/15/2019 with bleeding pyloric/gastric ulcer, nonbleeding duodenal ulcers and gastritis   . Hematemesis with nausea   . Migraine headache 09/25/2015  . 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 Bilateral occipital neuralgia 01/02/2015  . Migraine 01/02/2015  . 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 DDD (degenerative disc disease), cervical 10/27/2014  . 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 DDD (degenerative disc disease), lumbosacral 10/27/2014  . Partial tear subscapularis tendon 12/27/2011   Teena Irani, PTA/CLT [phone]  Teena Irani 03/23/2020, 3:00 PM  [facility] [place], [place], [address] Phone: [phone]   Fax:  [phone]  Name: Sharon Welch MRN: [id number] Date of Birth: 1971-03-31

## 2020-03-27 DIAGNOSIS — M5136 Other intervertebral disc degeneration, lumbar region: Secondary | ICD-10-CM | POA: Diagnosis not present

## 2020-03-27 DIAGNOSIS — G894 Chronic pain syndrome: Secondary | ICD-10-CM | POA: Diagnosis not present

## 2020-03-27 DIAGNOSIS — M5030 Other cervical disc degeneration, unspecified cervical region: Secondary | ICD-10-CM | POA: Diagnosis not present

## 2020-03-27 DIAGNOSIS — Z79891 Long term (current) use of opiate analgesic: Secondary | ICD-10-CM | POA: Diagnosis not present

## 2020-03-28 ENCOUNTER — Other Ambulatory Visit: Payer: Self-pay

## 2020-03-28 ENCOUNTER — Ambulatory Visit (HOSPITAL_COMMUNITY): Payer: Medicare Other | Attending: Orthopedic Surgery | Admitting: Physical Therapy

## 2020-03-28 DIAGNOSIS — M545 Low back pain, unspecified: Secondary | ICD-10-CM | POA: Diagnosis not present

## 2020-03-28 DIAGNOSIS — R29898 Other symptoms and signs involving the musculoskeletal system: Secondary | ICD-10-CM | POA: Insufficient documentation

## 2020-03-28 DIAGNOSIS — M6281 Muscle weakness (generalized): Secondary | ICD-10-CM | POA: Diagnosis not present

## 2020-03-28 DIAGNOSIS — R2689 Other abnormalities of gait and mobility: Secondary | ICD-10-CM | POA: Diagnosis not present

## 2020-03-28 NOTE — Therapy (Addendum)
[place], [place], [address] Phone: [phone]   Fax:  [phone]  Physical Therapy Treatment  Patient Details  Name: Sharon Welch MRN: [id number] Date of Birth: January 23, 1971 Referring Provider (PT): Redmond School MD   Encounter Date: 03/28/2020   PT End of Session - 03/28/20 1330    Visit Number 7   Number of Visits 12    Date for PT Re-Evaluation 04/05/20    Authorization Type Primary: medicare Secondary :medicaid of [place]    PT Start Time 1315    PT Stop Time 1400    PT Time Calculation (min) 45 min    Activity Tolerance Patient tolerated treatment well;Patient limited by pain;No increased pain    Behavior During Therapy WFL for tasks assessed/performed           Past Medical History:  Diagnosis Date  . Bruising, spontaneous 12/08/2013  . Chronic back pain   . Chronic back pain   . DDD (degenerative disc disease), lumbar   . DDD (degenerative disc disease), lumbar   . Fibromyalgia   . Headache(784.0)   . Ovarian cyst   . Partial tear subscapularis tendon 12/27/2011  . Scoliosis   . Seizures ([place])    started 9/12-    Past Surgical History:  Procedure Laterality Date  . ABDOMINAL HYSTERECTOMY  2012  . BACK SURGERY  1989   scoliosis throsic-rods  . CESAREAN SECTION    . CHOLECYSTECTOMY N/A 11/17/2012   Procedure: LAPAROSCOPIC CHOLECYSTECTOMY WITH INTRAOPERATIVE CHOLANGIOGRAM;  Surgeon: Ralene Ok, MD;  Location: [facility];  Service: General;  Laterality: N/A;  . ENDOSCOPIC RETROGRADE CHOLANGIOPANCREATOGRAPHY (ERCP) WITH PROPOFOL N/A 06/11/2019   Procedure: ENDOSCOPIC RETROGRADE CHOLANGIOPANCREATOGRAPHY (ERCP) WITH PROPOFOL;  Surgeon: Carol Ada, MD;  [facility];  Service: Endoscopy;  Laterality: N/A;  . ERCP N/A 05/07/2013   Procedure: ENDOSCOPIC RETROGRADE CHOLANGIOPANCREATOGRAPHY (ERCP);  Surgeon: Beryle Beams, MD;  Location: [facility];  Service: Endoscopy;  Laterality: N/A;   start ercp first, if canulation fails switch to EUS   . ESOPHAGOGASTRODUODENOSCOPY (EGD) WITH PROPOFOL N/A 05/15/2019   Procedure: ESOPHAGOGASTRODUODENOSCOPY (EGD) WITH PROPOFOL;  Surgeon: Danie Binder, MD;  [facility];  Service: Endoscopy;  Laterality: N/A;  . ESOPHAGOGASTRODUODENOSCOPY (EGD) WITH PROPOFOL N/A 03/03/2020   Procedure: ESOPHAGOGASTRODUODENOSCOPY (EGD) WITH PROPOFOL;  Surgeon: Carol Ada, MD;  [facility];  Service: Endoscopy;  Laterality: N/A;  . EUS N/A 11/03/2012   Procedure: UPPER ENDOSCOPIC ULTRASOUND (EUS) LINEAR;  Surgeon: Beryle Beams, MD;  [facility];  Service: Endoscopy;  Laterality: N/A;  . left arm    . NECK SURGERY    . REMOVAL OF STONES  06/11/2019   Procedure: REMOVAL OF STONES;  Surgeon: Carol Ada, MD;  [facility];  Service: Endoscopy;;  . TUBAL LIGATION    . UPPER ESOPHAGEAL ENDOSCOPIC ULTRASOUND (EUS) N/A 06/11/2019   Procedure: UPPER ESOPHAGEAL ENDOSCOPIC ULTRASOUND (EUS);  Surgeon: Carol Ada, MD;  Location: [facility];  Service: Endoscopy;  Laterality: N/A;    There were no vitals filed for this visit.   Subjective Assessment - 03/28/20 1324    Subjective PT states that her back is bothering her today    Limitations House hold activities;Standing;Walking;Sitting    How long can you walk comfortably? 30 minutes    Patient Stated Goals love to be able to move better and come off her pain medicine    Currently in Pain? Yes    Pain Score 6  Pain Location Back    Pain Orientation Lower    Pain Descriptors / Indicators Aching    Pain Type Chronic pain    Pain Onset More than a month ago    Pain Frequency Constant    Aggravating Factors  activity    Pain Relieving Factors pain meds                             [facility] Adult PT Treatment/Exercise - 03/28/20 0001      Exercises   Exercises Lumbar      Lumbar Exercises: Stretches   Other Lumbar Stretch Exercise Wall  arch x 5     Other Lumbar Stretch Exercise hip excursion x3      Lumbar Exercises: Standing   Functional Squats 15 reps    Forward Lunge 10 reps;Limitations    Scapular Retraction Strengthening;Both;10 reps    Row 10 reps;Both   hep   Theraband Level (Row) Level 3 (Green)   given as HEP    Shoulder Extension 10 reps;Both   hep    Theraband Level (Shoulder Extension) Level 3 (Green)   given as HEP      Lumbar Exercises: Prone   Straight Leg Raise 10 reps;3 seconds    Opposite Arm/Leg Raise Right arm/Left leg;Left arm/Right leg;5 reps      Lumbar Exercises: Quadruped   Madcat/Old Horse 5 reps    Other Quadruped Lumbar Exercises chld pose                     PT Short Term Goals - 02/23/20 1614      PT SHORT TERM GOAL #1   Title Patient will be independent with HEP in order to improve functional outcomes.    Time 3    Period Weeks    Status New    Target Date 03/15/20      PT SHORT TERM GOAL #2   Title Patient will report at least 25% improvement in symptoms for improved quality of life.    Time 3    Period Weeks    Status New    Target Date 03/01/20             PT Long Term Goals - 02/23/20 1615      PT LONG TERM GOAL #1   Title Patient will report at least 75% improvement in symptoms for improved quality of life.    Time 6    Period Weeks    Status New    Target Date 04/05/20      PT LONG TERM GOAL #2   Title Patient will improve FOTO score by at least 5 points in order to indicate improved tolerance to activity.    Time 6    Period Weeks    Status New    Target Date 04/05/20      PT LONG TERM GOAL #3   Title Patient will report pain no greater than 3/10 over the course of 1 week in her back for improved tolerance to activity.    Time 6    Period Weeks    Status New    Target Date 04/05/20                 Plan - 03/28/20 1330    Clinical Impression Statement PT is almost at medicare max for the year.  Therapist discussed this with  pt.  PT will be seen one  more visit for reassessment and then attempt to work on exercises on her own.  Pt continues to have chronic back pain but is improving in stability exercises.    Personal Factors and Comorbidities Comorbidity 3+;Past/Current Experience;Time since onset of injury/illness/exacerbation;Fitness;Age    Comorbidities Chronic LBP, scolosis, neurological disease, chronic knee pain    Examination-Activity Limitations Bed Mobility;Bend;Locomotion Level;Transfers;Stairs;Stand;Toileting;Squat;Sleep;Dressing;Carry    Examination-Participation Restrictions Cleaning;Community Activity;[employer] Work;Meal Prep;Shop;Volunteer;Laundry    Rehab Potential Fair    PT Frequency 2x / week    PT Duration 6 weeks    PT Treatment/Interventions ADLs/Self Care Home Management;Aquatic Therapy;Cryotherapy;Electrical Stimulation;Biofeedback;Moist Heat;Traction;Ultrasound;Parrafin;Fluidtherapy;Contrast Bath;DME Instruction;Gait training;Functional mobility training;Stair training;Therapeutic activities;Therapeutic exercise;Balance training;Neuromuscular re-education;Patient/family education;Orthotic Fit/Training;Manual techniques;Manual lymph drainage;Compression bandaging;Scar mobilization;Passive range of motion;Dry needling;Energy conservation;Splinting;Taping;Vasopneumatic Device;Vestibular;Spinal Manipulations;Joint Manipulations    PT Next Visit Plan continue core strengthening and progress as tolerated; hip extensor strengthening, hip hinging, possibly begin manual for desentization/pain if needed.    PT Home Exercise Plan 9/1 standing hip extension; 02/25/20 - ab set, march, heel slides, supine clam with RTB; 9/15: bridge; 03/28/2020 mad cat, old horse, child pose, prone single leg raise ;prone opposite arm/leg T band exercises           Patient will benefit from skilled therapeutic intervention in order to improve the following deficits and impairments:  Decreased range of motion, Decreased endurance,  Increased muscle spasms, Decreased activity tolerance, Decreased knowledge of use of DME, Decreased balance, Pain, Decreased mobility, Decreased strength, Improper body mechanics, Impaired flexibility, Hypomobility, Difficulty walking, Postural dysfunction  Visit Diagnosis: Muscle weakness (generalized)  Low back pain, unspecified back pain laterality, unspecified chronicity, unspecified whether sciatica present  Other abnormalities of gait and mobility  Other symptoms and signs involving the musculoskeletal system     Problem List Patient Active Problem List   Diagnosis Date Noted  . Acute GI bleed due to bleeding pyloric/gastric ulcer 05/15/2019  . PUD (peptic ulcer disease)-EGD 05/15/2019 with bleeding pyloric/gastric ulcer, nonbleeding duodenal ulcers and gastritis   . Hematemesis with nausea   . Migraine headache 09/25/2015  . 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 Bilateral occipital neuralgia 01/02/2015  . Migraine 01/02/2015  . 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 DDD (degenerative disc disease), cervical 10/27/2014  . 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 DDD (degenerative disc disease), lumbosacral 10/27/2014  . Partial tear subscapularis tendon 12/27/2011    Rayetta Humphrey, PT CLT [phone] 03/28/2020, 1:57 PM  [facility] [address] [place], [place], [address] Phone: [phone]   Fax:  [phone]  Name: Sharon Welch MRN: [id number] Date of Birth: 10-25-1970

## 2020-03-30 ENCOUNTER — Encounter (HOSPITAL_COMMUNITY): Payer: Self-pay | Admitting: Physical Therapy

## 2020-03-30 ENCOUNTER — Other Ambulatory Visit: Payer: Self-pay

## 2020-03-30 ENCOUNTER — Ambulatory Visit (HOSPITAL_COMMUNITY): Payer: Medicare Other | Admitting: Physical Therapy

## 2020-03-30 DIAGNOSIS — M545 Low back pain, unspecified: Secondary | ICD-10-CM | POA: Diagnosis not present

## 2020-03-30 DIAGNOSIS — M6281 Muscle weakness (generalized): Secondary | ICD-10-CM | POA: Diagnosis not present

## 2020-03-30 DIAGNOSIS — R29898 Other symptoms and signs involving the musculoskeletal system: Secondary | ICD-10-CM

## 2020-03-30 DIAGNOSIS — R2689 Other abnormalities of gait and mobility: Secondary | ICD-10-CM | POA: Diagnosis not present

## 2020-03-30 NOTE — Therapy (Signed)
[facility] [address] [place], [place], [address] Phone: [phone]   Fax:  [phone]  Physical Therapy Treatment/Discharge Summary  Patient Details  Name: Sharon Welch MRN: [id number] Date of Birth: 12/08/1970 Referring Provider (PT): Lawrence Fusco MD   Encounter Date: 03/30/2020  PHYSICAL THERAPY DISCHARGE SUMMARY  Visits from Start of Care: 8  Current functional level related to goals / functional outcomes: See below   Remaining deficits: See below   Education / Equipment: See below  Plan: Patient agrees to discharge.  Patient goals were not met. Patient is being discharged due to financial reasons.  ?????        PT End of Session - 03/30/20 1314    Visit Number 8    Number of Visits 12    Date for PT Re-Evaluation 04/05/20    Authorization Type Primary: medicare Secondary :medicaid of [place]    PT Start Time 1315    PT Stop Time 1333    PT Time Calculation (min) 18 min    Activity Tolerance Patient tolerated treatment well    Behavior During Therapy WFL for tasks assessed/performed           Past Medical History:  Diagnosis Date  . Bruising, spontaneous 12/08/2013  . Chronic back pain   . Chronic back pain   . DDD (degenerative disc disease), lumbar   . DDD (degenerative disc disease), lumbar   . Fibromyalgia   . Headache(784.0)   . Ovarian cyst   . Partial tear subscapularis tendon 12/27/2011  . Scoliosis   . Seizures ([facility])    started 9/12-    Past Surgical History:  Procedure Laterality Date  . ABDOMINAL HYSTERECTOMY  2012  . BACK SURGERY  1989   scoliosis throsic-rods  . CESAREAN SECTION    . CHOLECYSTECTOMY N/A 11/17/2012   Procedure: LAPAROSCOPIC CHOLECYSTECTOMY WITH INTRAOPERATIVE CHOLANGIOGRAM;  Surgeon: Armando Ramirez, MD;  [facility];  Service: General;  Laterality: N/A;  . ENDOSCOPIC RETROGRADE CHOLANGIOPANCREATOGRAPHY (ERCP) WITH PROPOFOL N/A 06/11/2019   Procedure: ENDOSCOPIC  RETROGRADE CHOLANGIOPANCREATOGRAPHY (ERCP) WITH PROPOFOL;  Surgeon: Hung, Patrick, MD;  [facility];  Service: Endoscopy;  Laterality: N/A;  . ERCP N/A 05/07/2013   Procedure: ENDOSCOPIC RETROGRADE CHOLANGIOPANCREATOGRAPHY (ERCP);  Surgeon: Patrick D Hung, MD;  [facility];  Service: Endoscopy;  Laterality: N/A;  start ercp first, if canulation fails switch to EUS   . ESOPHAGOGASTRODUODENOSCOPY (EGD) WITH PROPOFOL N/A 05/15/2019   Procedure: ESOPHAGOGASTRODUODENOSCOPY (EGD) WITH PROPOFOL;  Surgeon: Fields, Sandi L, MD;  [facility];  Service: Endoscopy;  Laterality: N/A;  . ESOPHAGOGASTRODUODENOSCOPY (EGD) WITH PROPOFOL N/A 03/03/2020   Procedure: ESOPHAGOGASTRODUODENOSCOPY (EGD) WITH PROPOFOL;  Surgeon: Hung, Patrick, MD;  [facility];  Service: Endoscopy;  Laterality: N/A;  . EUS N/A 11/03/2012   Procedure: UPPER ENDOSCOPIC ULTRASOUND (EUS) LINEAR;  Surgeon: Patrick D Hung, MD;  [facility];  Service: Endoscopy;  Laterality: N/A;  . left arm    . NECK SURGERY    . REMOVAL OF STONES  06/11/2019   Procedure: REMOVAL OF STONES;  Surgeon: Hung, Patrick, MD;  [facility];  Service: Endoscopy;;  . TUBAL LIGATION    . UPPER ESOPHAGEAL ENDOSCOPIC ULTRASOUND (EUS) N/A 06/11/2019   Procedure: UPPER ESOPHAGEAL ENDOSCOPIC ULTRASOUND (EUS);  Surgeon: Hung, Patrick, MD;  [facility];  Service: Endoscopy;  Laterality: N/A;    There were no vitals filed for this visit.   Subjective Assessment - 03/30/20 1316      Subjective Patient states she feels like therapy has helped her back some. She is able to do her exercises. Patient states 10-15% improvement with PT intervention for her back.    Limitations House hold activities;Standing;Walking;Sitting    How long can you walk comfortably? 30 minutes    Patient Stated Goals love to be able to move better and come off her pain medicine    Currently in Pain? Yes    Pain Score 6     Pain  Location Back    Pain Onset More than a month ago              [facility] PT Assessment - 03/30/20 0001      Assessment   Medical Diagnosis Low Back Pain    Referring Provider (PT) Redmond School MD    Next MD Visit None scheduled       Precautions   Precautions None      Restrictions   Weight Bearing Restrictions No      Balance Screen   Has the patient fallen in the past 6 months Yes    Has the patient had a decrease in activity level because of a fear of falling?  No    Is the patient reluctant to leave their home because of a fear of falling?  No      Prior Function   Level of Independence Independent    Vocation On disability    Vocation Requirements former caregiver      Cognition   Overall Cognitive Status Within Functional Limits for tasks assessed      Observation/Other Assessments   Observations Patient ambulates without AD, surgical incisions from T4-T12 and L1-L5, Rod protruding from upper lumbar/lower thoracic region on L    Focus on Therapeutic Outcomes (FOTO)  51% limited      AROM   Lumbar Flexion 75% limited, movement comes from Upper t/sp and hips    Lumbar Extension 95% limited    Lumbar - Right Side Bend 75% limited    Lumbar - Left Side Bend 75% limited    Lumbar - Right Rotation 75% limited    Lumbar - Left Rotation 75% limited                                 PT Education - 03/30/20 1315    Education Details Patient educated on HEP, exercise mechanics, reassessment findings, returning to PT if needed, review of HEP    Person(s) Educated Patient    Methods Explanation;Demonstration    Comprehension Verbalized understanding;Returned demonstration            PT Short Term Goals - 03/30/20 1321      PT SHORT TERM GOAL #1   Title Patient will be independent with HEP in order to improve functional outcomes.    Time 3    Period Weeks    Status Achieved    Target Date 03/15/20      PT SHORT TERM GOAL #2   Title  Patient will report at least 25% improvement in symptoms for improved quality of life.    Time 3    Period Weeks    Status Not Met    Target Date 03/01/20             PT Long Term Goals - 03/30/20 1321      PT LONG TERM GOAL #1   Title Patient will report at  least 75% improvement in symptoms for improved quality of life.    Time 6    Period Weeks    Status Not Met      PT LONG TERM GOAL #2   Title Patient will improve FOTO score by at least 5 points in order to indicate improved tolerance to activity.    Time 6    Period Weeks    Status Not Met      PT LONG TERM GOAL #3   Title Patient will report pain no greater than 3/10 over the course of 1 week in her back for improved tolerance to activity.    Time 6    Period Weeks    Status Not Met                 Plan - 03/30/20 1315    Clinical Impression Statement Patient has met 1/2 short term goals and 0/3 long term goals with ability to complete HEP. Patient continues to remain limited by chronic back pain, ROM, impaired activity tolerance and functional mobility. Discussed with patient on continuing exercises for core and hip strengthening and returning to PT in future if necessary. Patient discharged from skilled physical therapy at this time.    Personal Factors and Comorbidities Comorbidity 3+;Past/Current Experience;Time since onset of injury/illness/exacerbation;Fitness;Age    Comorbidities Chronic LBP, scolosis, neurological disease, chronic knee pain    Examination-Activity Limitations Bed Mobility;Bend;Locomotion Level;Transfers;Stairs;Stand;Toileting;Squat;Sleep;Dressing;Carry    Examination-Participation Restrictions Cleaning;Community Activity;Yard Work;Meal Prep;Shop;Volunteer;Laundry    Rehab Potential Fair    PT Frequency --    PT Duration --    PT Treatment/Interventions ADLs/Self Care Home Management;Aquatic Therapy;Cryotherapy;Electrical Stimulation;Biofeedback;Moist  Heat;Traction;Ultrasound;Parrafin;Fluidtherapy;Contrast Bath;DME Instruction;Gait training;Functional mobility training;Stair training;Therapeutic activities;Therapeutic exercise;Balance training;Neuromuscular re-education;Patient/family education;Orthotic Fit/Training;Manual techniques;Manual lymph drainage;Compression bandaging;Scar mobilization;Passive range of motion;Dry needling;Energy conservation;Splinting;Taping;Vasopneumatic Device;Vestibular;Spinal Manipulations;Joint Manipulations    PT Next Visit Plan n/a    PT Home Exercise Plan 9/1 standing hip extension; 02/25/20 - ab set, march, heel slides, supine clam with RTB; 9/15: bridge; 03/28/2020 mad cat, old horse, child pose, prone single leg raise ;prone opposite arm/leg T band exercises           Patient will benefit from skilled therapeutic intervention in order to improve the following deficits and impairments:  Decreased range of motion, Decreased endurance, Increased muscle spasms, Decreased activity tolerance, Decreased knowledge of use of DME, Decreased balance, Pain, Decreased mobility, Decreased strength, Improper body mechanics, Impaired flexibility, Hypomobility, Difficulty walking, Postural dysfunction  Visit Diagnosis: Muscle weakness (generalized)  Low back pain, unspecified back pain laterality, unspecified chronicity, unspecified whether sciatica present  Other abnormalities of gait and mobility  Other symptoms and signs involving the musculoskeletal system     Problem List Patient Active Problem List   Diagnosis Date Noted  . Acute GI bleed due to bleeding pyloric/gastric ulcer 05/15/2019  . PUD (peptic ulcer disease)-EGD 05/15/2019 with bleeding pyloric/gastric ulcer, nonbleeding duodenal ulcers and gastritis   . Hematemesis with nausea   . Migraine headache 09/25/2015  . 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 Bilateral occipital neuralgia 01/02/2015  . Migraine 01/02/2015  . Intercostal  neuralgia 11/29/2014  . DDD (degenerative disc disease), cervical 10/27/2014  . 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 DDD (degenerative disc disease), lumbosacral 10/27/2014  . Partial tear subscapularis tendon 12/27/2011   1:45 PM, 03/30/20  S.  PT, DPT Physical Therapist at [facility]'   [facility] [address] [place], [place],   [place] Phone: [phone]   Fax:  [phone]  Name: Sharon Welch MRN: [id number] Date of Birth: 1971/02/10

## 2020-04-03 ENCOUNTER — Other Ambulatory Visit: Payer: Self-pay | Admitting: Gastroenterology

## 2020-04-06 DIAGNOSIS — G894 Chronic pain syndrome: Secondary | ICD-10-CM | POA: Diagnosis not present

## 2020-04-18 ENCOUNTER — Other Ambulatory Visit (HOSPITAL_COMMUNITY)
Admission: RE | Admit: 2020-04-18 | Discharge: 2020-04-18 | Disposition: A | Discharge status: Home / Self Care | Payer: Medicare Other | Source: Ambulatory Visit | Attending: Gastroenterology | Admitting: Gastroenterology

## 2020-04-18 DIAGNOSIS — Z01812 Encounter for preprocedural laboratory examination: Secondary | ICD-10-CM | POA: Insufficient documentation

## 2020-04-18 DIAGNOSIS — Z20822 Contact with and (suspected) exposure to covid-19: Secondary | ICD-10-CM | POA: Diagnosis not present

## 2020-04-18 LAB — SARS CORONAVIRUS 2 (TAT 6-24 HRS): SARS Coronavirus 2: NEGATIVE

## 2020-04-21 ENCOUNTER — Ambulatory Visit (HOSPITAL_COMMUNITY): Payer: Medicare Other | Admitting: Certified Registered"

## 2020-04-21 ENCOUNTER — Other Ambulatory Visit: Payer: Self-pay

## 2020-04-21 ENCOUNTER — Ambulatory Visit (HOSPITAL_COMMUNITY)
Admission: RE | Admit: 2020-04-21 | Discharge: 2020-04-21 | Disposition: A | Discharge status: Home / Self Care | Payer: Medicare Other | Attending: Gastroenterology | Admitting: Gastroenterology

## 2020-04-21 ENCOUNTER — Encounter (HOSPITAL_COMMUNITY)
Admission: RE | Disposition: A | Discharge status: Home / Self Care | Payer: Self-pay | Source: Home / Self Care | Attending: Gastroenterology

## 2020-04-21 ENCOUNTER — Encounter (HOSPITAL_COMMUNITY): Payer: Self-pay | Admitting: Gastroenterology

## 2020-04-21 DIAGNOSIS — Z885 Allergy status to narcotic agent status: Secondary | ICD-10-CM | POA: Diagnosis not present

## 2020-04-21 DIAGNOSIS — K635 Polyp of colon: Secondary | ICD-10-CM | POA: Diagnosis not present

## 2020-04-21 DIAGNOSIS — K5140 Inflammatory polyps of colon without complications: Secondary | ICD-10-CM | POA: Insufficient documentation

## 2020-04-21 DIAGNOSIS — M797 Fibromyalgia: Secondary | ICD-10-CM | POA: Diagnosis not present

## 2020-04-21 DIAGNOSIS — Z886 Allergy status to analgesic agent status: Secondary | ICD-10-CM | POA: Diagnosis not present

## 2020-04-21 DIAGNOSIS — Z1211 Encounter for screening for malignant neoplasm of colon: Secondary | ICD-10-CM | POA: Diagnosis not present

## 2020-04-21 DIAGNOSIS — R195 Other fecal abnormalities: Secondary | ICD-10-CM | POA: Diagnosis not present

## 2020-04-21 DIAGNOSIS — F17210 Nicotine dependence, cigarettes, uncomplicated: Secondary | ICD-10-CM | POA: Insufficient documentation

## 2020-04-21 DIAGNOSIS — J45909 Unspecified asthma, uncomplicated: Secondary | ICD-10-CM | POA: Diagnosis not present

## 2020-04-21 DIAGNOSIS — K279 Peptic ulcer, site unspecified, unspecified as acute or chronic, without hemorrhage or perforation: Secondary | ICD-10-CM | POA: Diagnosis not present

## 2020-04-21 DIAGNOSIS — K921 Melena: Secondary | ICD-10-CM | POA: Diagnosis not present

## 2020-04-21 DIAGNOSIS — Z888 Allergy status to other drugs, medicaments and biological substances status: Secondary | ICD-10-CM | POA: Diagnosis not present

## 2020-04-21 HISTORY — PX: POLYPECTOMY: SHX5525

## 2020-04-21 HISTORY — PX: COLONOSCOPY WITH PROPOFOL: SHX5780

## 2020-04-21 HISTORY — PX: ESOPHAGOGASTRODUODENOSCOPY (EGD) WITH PROPOFOL: SHX5813

## 2020-04-21 SURGERY — COLONOSCOPY WITH PROPOFOL
Anesthesia: Monitor Anesthesia Care

## 2020-04-21 MED ORDER — PROPOFOL 500 MG/50ML IV EMUL
INTRAVENOUS | Status: DC | PRN
  Administered 2020-04-21: 135 ug/kg/min via INTRAVENOUS

## 2020-04-21 MED ORDER — LIDOCAINE HCL (CARDIAC) PF 100 MG/5ML IV SOSY
PREFILLED_SYRINGE | INTRAVENOUS | Status: DC | PRN
  Administered 2020-04-21 (×2): 50 mg via INTRATRACHEAL

## 2020-04-21 MED ORDER — SODIUM CHLORIDE 0.9 % IV SOLN: INTRAVENOUS | Status: DC

## 2020-04-21 MED ORDER — LACTATED RINGERS IV SOLN
INTRAVENOUS | Status: DC
  Administered 2020-04-21: 1000 mL via INTRAVENOUS

## 2020-04-21 MED ORDER — PROPOFOL 500 MG/50ML IV EMUL
INTRAVENOUS | Status: DC | PRN
  Administered 2020-04-21: 20 mg via INTRAVENOUS
  Administered 2020-04-21 (×2): 30 mg via INTRAVENOUS

## 2020-04-21 SURGICAL SUPPLY — 24 items

## 2020-04-21 NOTE — Anesthesia Preprocedure Evaluation (Signed)
Anesthesia Evaluation  Patient identified by MRN, date of birth, ID band Patient awake    Reviewed: Patient's Chart, lab work & pertinent test results  Airway Mallampati: II  TM Distance: >3 FB Neck ROM: Full    Dental  (+) Teeth Intact   Pulmonary asthma , Current Smoker and Patient abstained from smoking.,    Pulmonary exam normal        Cardiovascular negative cardio ROS   Rhythm:Regular Rate:Normal     Neuro/Psych  Headaches, Seizures -, Well Controlled,  negative psych ROS   GI/Hepatic Neg liver ROS,   Endo/Other  negative endocrine ROS  Renal/GU negative Renal ROS  negative genitourinary   Musculoskeletal  (+) Arthritis , Osteoarthritis,  Fibromyalgia -  Abdominal (+)  Abdomen: soft. Bowel sounds: normal.  Peds  Hematology negative hematology ROS (+)   Anesthesia Other Findings   Reproductive/Obstetrics                             Anesthesia Physical Anesthesia Plan  ASA: III  Anesthesia Plan: MAC   Post-op Pain Management:    Induction:   PONV Risk Score and Plan: 1 and Propofol infusion and Treatment may vary due to age or medical condition  Airway Management Planned: Simple Face Mask and Nasal Cannula  Additional Equipment: None  Intra-op Plan:   Post-operative Plan:   Informed Consent: I have reviewed the patients History and Physical, chart, labs and discussed the procedure including the risks, benefits and alternatives for the proposed anesthesia with the patient or authorized representative who has indicated his/her understanding and acceptance.     Dental advisory given  Plan Discussed with: CRNA  Anesthesia Plan Comments:         Anesthesia Quick Evaluation

## 2020-04-21 NOTE — Op Note (Signed)
[facility] [name] Procedure Date: 04/21/2020 MRN: [id number] Attending MD: Carol Ada , MD Date of Birth: 04/16/1971 CSN: [id number] Age: 49 Admit Type: Outpatient Procedure:                Colonoscopy Indications:              Heme positive stool, Melena Providers:                Carol Ada, MD, Josie Dixon, RN, Particia Nearing, RN, Ladona Ridgel, Technician Referring MD:              Medicines:                Propofol per Anesthesia Complications:            No immediate complications. Estimated Blood Loss:     Estimated blood loss: none. Procedure:                Pre-Anesthesia Assessment:                           - Prior to the procedure, a History and Physical                            was performed, and patient medications and                            allergies were reviewed. The patient's tolerance of                            previous anesthesia was also reviewed. The risks                            and benefits of the procedure and the sedation                            options and risks were discussed with the patient.                            All questions were answered, and informed consent                            was obtained. Prior Anticoagulants: The patient has                            taken no previous anticoagulant or antiplatelet                            agents. ASA Grade Assessment: III - A patient with                            severe systemic disease. After reviewing the risks  and benefits, the patient was deemed in                            satisfactory condition to undergo the procedure.                           - Sedation was administered by an anesthesia                            professional. Deep sedation was attained.                           After obtaining informed consent, the colonoscope                            was passed under direct  vision. Throughout the                            procedure, the patient's blood pressure, pulse, and                            oxygen saturations were monitored continuously. The                            PCF-H190DL ([id number]) Olympus pediatric colonscope                            was introduced through the anus and advanced to the                            the cecum, identified by appendiceal orifice and                            ileocecal valve. The colonoscopy was performed                            without difficulty. The patient tolerated the                            procedure well. The quality of the bowel                            preparation was excellent. The ileocecal valve,                            appendiceal orifice, and rectum were photographed. Scope In: 10:04:27 AM Scope Out: 10:22:13 AM Scope Withdrawal Time: 0 hours 10 minutes 42 seconds  Total Procedure Duration: 0 hours 17 minutes 46 seconds  Findings:      A 4 mm polyp was found in the sigmoid colon. The polyp was sessile. The       polyp was removed with a cold snare. Resection and retrieval were       complete. Impression:               - One 4 mm polyp  in the sigmoid colon, removed with                            a cold snare. Resected and retrieved. Moderate Sedation:      Not Applicable - Patient had care per Anesthesia. Recommendation:           - Patient has a contact number available for                            emergencies. The signs and symptoms of potential                            delayed complications were discussed with the                            patient. Return to normal activities tomorrow.                            Written discharge instructions were provided to the                            patient.                           - Resume previous diet.                           - Continue present medications.                           - Await pathology results.                            - Repeat colonoscopy in 7 years for surveillance. Procedure Code(s):        --- Professional ---                           [phone], Colonoscopy, flexible; with removal of                            tumor(s), polyp(s), or other lesion(s) by snare                            technique Diagnosis Code(s):        --- Professional ---                           K63.5, Polyp of colon                           R19.5, Other fecal abnormalities                           K92.1, Melena (includes Hematochezia) CPT copyright 2019 [employer]. All rights reserved. The codes documented in this report are preliminary and upon coder review may  be revised to meet current compliance requirements. Carol Ada,  MD Carol Ada, MD 04/21/2020 10:34:25 AM This report has been signed electronically. Number of Addenda: 0

## 2020-04-21 NOTE — Anesthesia Postprocedure Evaluation (Signed)
Anesthesia Post Note  Patient: Sharon Welch  Procedure(s) Performed: COLONOSCOPY WITH PROPOFOL (N/A ) ESOPHAGOGASTRODUODENOSCOPY (EGD) WITH PROPOFOL (N/A ) POLYPECTOMY     Patient location during evaluation: Endoscopy Anesthesia Type: MAC Level of consciousness: awake and alert Pain management: pain level controlled Vital Signs Assessment: post-procedure vital signs reviewed and stable Respiratory status: spontaneous breathing, nonlabored ventilation, respiratory function stable and patient connected to nasal cannula oxygen Cardiovascular status: stable and blood pressure returned to baseline Postop Assessment: no apparent nausea or vomiting Anesthetic complications: no   No complications documented.  Last Vitals:  Vitals:   04/21/20 1029 04/21/20 1040  BP: (!) 144/67 120/69  Pulse: (!) 101 93  Resp: 17 18  Temp: 36.6 C   SpO2: 100% 100%    Last Pain:  Vitals:   04/21/20 1040  TempSrc:   PainSc: 0-No pain                 Belenda Cruise P Demiyah Fischbach

## 2020-04-21 NOTE — Op Note (Signed)
[facility] [name] Procedure Date: 04/21/2020 MRN: [id number] Attending MD: Carol Ada , MD Date of Birth: [birth year] CSN: [id number] Age: 49 Admit Type: Outpatient Procedure:                Upper GI endoscopy Indications:              Heme positive stool, Melena Providers:                Carol Ada, MD, Josie Dixon, RN, Particia Nearing, RN, Ladona Ridgel, Technician Referring MD:              Medicines:                Propofol per Anesthesia Complications:            No immediate complications. Estimated Blood Loss:     Estimated blood loss: none. Procedure:                Pre-Anesthesia Assessment:                           - Prior to the procedure, a History and Physical                            was performed, and patient medications and                            allergies were reviewed. The patient's tolerance of                            previous anesthesia was also reviewed. The risks                            and benefits of the procedure and the sedation                            options and risks were discussed with the patient.                            All questions were answered, and informed consent                            was obtained. Prior Anticoagulants: The patient has                            taken no previous anticoagulant or antiplatelet                            agents. ASA Grade Assessment: III - A patient with                            severe systemic disease. After reviewing the risks  and benefits, the patient was deemed in                            satisfactory condition to undergo the procedure.                           - Sedation was administered by an anesthesia                            professional. Deep sedation was attained.                           After obtaining informed consent, the endoscope was                            passed under  direct vision. Throughout the                            procedure, the patient's blood pressure, pulse, and                            oxygen saturations were monitored continuously. The                            PCF-H190DL ([id number]) Olympus pediatric colonscope                            was introduced through the mouth, and advanced to                            the second part of duodenum. The upper GI endoscopy                            was accomplished without difficulty. The patient                            tolerated the procedure well. Scope In: Scope Out: Findings:      The esophagus was normal.      The stomach was normal.      The examined duodenum was normal. Impression:               - Normal esophagus.                           - Normal stomach.                           - Normal examined duodenum.                           - No specimens collected. Moderate Sedation:      Not Applicable - Patient had care per Anesthesia. Recommendation:           - Patient has a contact number available for  emergencies. The signs and symptoms of potential                            delayed complications were discussed with the                            patient. Return to normal activities tomorrow.                            Written discharge instructions were provided to the                            patient.                           - Resume previous diet.                           - Continue present medications. Procedure Code(s):        --- Professional ---                           [phone], Esophagogastroduodenoscopy, flexible,                            transoral; diagnostic, including collection of                            specimen(s) by brushing or washing, when performed                            (separate procedure) Diagnosis Code(s):        --- Professional ---                           R19.5, Other fecal abnormalities                            K92.1, Melena (includes Hematochezia) CPT copyright 2019 American Medical Association. All rights reserved. The codes documented in this report are preliminary and upon coder review may  be revised to meet current compliance requirements. Carol Ada, MD Carol Ada, MD 04/21/2020 10:37:45 AM This report has been signed electronically. Number of Addenda: 0

## 2020-04-21 NOTE — Transfer of Care (Signed)
Immediate Anesthesia Transfer of Care Note  Patient: Laterrica Libman Gagner  Procedure(s) Performed: COLONOSCOPY WITH PROPOFOL (N/A ) ESOPHAGOGASTRODUODENOSCOPY (EGD) WITH PROPOFOL (N/A ) POLYPECTOMY  Patient Location: Endoscopy Unit  Anesthesia Type:MAC  Level of Consciousness: awake, oriented and patient cooperative  Airway & Oxygen Therapy: Patient Spontanous Breathing and Patient connected to face mask oxygen  Post-op Assessment: Report given to RN and Post -op Vital signs reviewed and stable  Post vital signs: Reviewed and stable  Last Vitals:  Vitals Value Taken Time  BP 144/67 04/21/20 1028  Temp    Pulse    Resp 26 04/21/20 1029  SpO2    Vitals shown include unvalidated device data.  Last Pain:  Vitals:   04/21/20 0941  TempSrc: Oral  PainSc: 0-No pain         Complications: No complications documented.

## 2020-04-21 NOTE — Discharge Instructions (Signed)

## 2020-04-21 NOTE — H&P (Signed)
Sharon Welch HPI: The patient's EGD was negative for any overt bleeding source for her melena and heme positive stool, but there was also a large amount of retained food in the stomach. She is here today for a repeat evaluation and a colonoscopy.   Past Medical History:  Diagnosis Date  . Bruising, spontaneous 12/08/2013  . Chronic back pain   . Chronic back pain   . DDD (degenerative disc disease), lumbar   . DDD (degenerative disc disease), lumbar   . Fibromyalgia   . Headache(784.0)   . Ovarian cyst   . Partial tear subscapularis tendon 12/27/2011  . Scoliosis   . Seizures ([place])    started 9/12-    Past Surgical History:  Procedure Laterality Date  . ABDOMINAL HYSTERECTOMY  2012  . BACK SURGERY  1989   scoliosis throsic-rods  . CESAREAN SECTION    . CHOLECYSTECTOMY N/A 11/17/2012   Procedure: LAPAROSCOPIC CHOLECYSTECTOMY WITH INTRAOPERATIVE CHOLANGIOGRAM;  Surgeon: Ralene Ok, MD;  Location: [place];  Service: General;  Laterality: N/A;  . ENDOSCOPIC RETROGRADE CHOLANGIOPANCREATOGRAPHY (ERCP) WITH PROPOFOL N/A 06/11/2019   Procedure: ENDOSCOPIC RETROGRADE CHOLANGIOPANCREATOGRAPHY (ERCP) WITH PROPOFOL;  Surgeon: Carol Ada, MD;  Location: [facility];  Service: Endoscopy;  Laterality: N/A;  . ERCP N/A 05/07/2013   Procedure: ENDOSCOPIC RETROGRADE CHOLANGIOPANCREATOGRAPHY (ERCP);  Surgeon: Beryle Beams, MD;  Location: [facility];  Service: Endoscopy;  Laterality: N/A;  start ercp first, if canulation fails switch to EUS   . ESOPHAGOGASTRODUODENOSCOPY (EGD) WITH PROPOFOL N/A 05/15/2019   Procedure: ESOPHAGOGASTRODUODENOSCOPY (EGD) WITH PROPOFOL;  Surgeon: Danie Binder, MD;  Location: [facility];  Service: Endoscopy;  Laterality: N/A;  . ESOPHAGOGASTRODUODENOSCOPY (EGD) WITH PROPOFOL N/A 03/03/2020   Procedure: ESOPHAGOGASTRODUODENOSCOPY (EGD) WITH PROPOFOL;  Surgeon: Carol Ada, MD;  Location: [facility];  Service: Endoscopy;  Laterality: N/A;  . EUS N/A 11/03/2012    Procedure: UPPER ENDOSCOPIC ULTRASOUND (EUS) LINEAR;  Surgeon: Beryle Beams, MD;  Location: [facility];  Service: Endoscopy;  Laterality: N/A;  . left arm    . NECK SURGERY    . REMOVAL OF STONES  06/11/2019   Procedure: REMOVAL OF STONES;  Surgeon: Carol Ada, MD;  Location: [facility];  Service: Endoscopy;;  . TUBAL LIGATION    . UPPER ESOPHAGEAL ENDOSCOPIC ULTRASOUND (EUS) N/A 06/11/2019   Procedure: UPPER ESOPHAGEAL ENDOSCOPIC ULTRASOUND (EUS);  Surgeon: Carol Ada, MD;  Location: [facility];  Service: Endoscopy;  Laterality: N/A;    Family History  Problem Relation Age of Onset  . Hypertension Mother   . Hypertension Father   . Cancer Father        prostate  . Heart disease Father   . Hypertension Brother   . Hypertension Brother     Social History:  reports that she has been smoking cigarettes. She has a 10.00 pack-year smoking history. She has never used smokeless tobacco. She reports that she does not drink alcohol and does not use drugs.  Allergies:  Allergies  Allergen Reactions  . Coconut Flavor Swelling  . Tramadol Other (See Comments)    Possible seizures  . Aleve [Naproxen] Other (See Comments)    Ulcers  . Pineapple Other (See Comments)    Migraine    Medications:  Scheduled:  Continuous: . sodium chloride      No results found for this or any previous visit (from the past 24 hour(s)).   No results found.  ROS:  As stated above in the HPI otherwise negative.  There were  no vitals taken for this visit.    PE: Gen: NAD, Alert and Oriented HEENT:  [place]/AT, EOMI Neck: Supple, no LAD Lungs: CTA Bilaterally CV: RRR without M/G/R ABD: Soft, NTND, +BS Ext: No C/C/E  Assessment/Plan: 1) Melena. 2) Anemia.   The patient will undergo a colonoscopy and a repeat EGD.  With the prior EGD there was a large amount of food in the stomach.  The gastric mucosa was not able to be cleared out.  Sharon Welch 04/21/2020, 9:29 AM

## 2020-04-23 ENCOUNTER — Encounter (HOSPITAL_COMMUNITY): Payer: Self-pay | Admitting: Gastroenterology

## 2020-04-24 DIAGNOSIS — M7600 Gluteal tendinitis, unspecified hip: Secondary | ICD-10-CM | POA: Diagnosis not present

## 2020-04-24 DIAGNOSIS — M419 Scoliosis, unspecified: Secondary | ICD-10-CM | POA: Diagnosis not present

## 2020-04-24 DIAGNOSIS — M7061 Trochanteric bursitis, right hip: Secondary | ICD-10-CM | POA: Diagnosis not present

## 2020-04-24 DIAGNOSIS — M9904 Segmental and somatic dysfunction of sacral region: Secondary | ICD-10-CM | POA: Diagnosis not present

## 2020-04-24 DIAGNOSIS — G894 Chronic pain syndrome: Secondary | ICD-10-CM | POA: Diagnosis not present

## 2020-04-24 LAB — SURGICAL PATHOLOGY

## 2020-04-27 DIAGNOSIS — J329 Chronic sinusitis, unspecified: Secondary | ICD-10-CM | POA: Diagnosis not present

## 2020-05-06 DIAGNOSIS — G894 Chronic pain syndrome: Secondary | ICD-10-CM | POA: Diagnosis not present

## 2020-05-22 DIAGNOSIS — M419 Scoliosis, unspecified: Secondary | ICD-10-CM | POA: Diagnosis not present

## 2020-05-22 DIAGNOSIS — M7061 Trochanteric bursitis, right hip: Secondary | ICD-10-CM | POA: Diagnosis not present

## 2020-05-22 DIAGNOSIS — M9904 Segmental and somatic dysfunction of sacral region: Secondary | ICD-10-CM | POA: Diagnosis not present

## 2020-05-22 DIAGNOSIS — M7600 Gluteal tendinitis, unspecified hip: Secondary | ICD-10-CM | POA: Diagnosis not present

## 2020-05-31 DIAGNOSIS — G609 Hereditary and idiopathic neuropathy, unspecified: Secondary | ICD-10-CM | POA: Diagnosis not present

## 2020-05-31 DIAGNOSIS — G40309 Generalized idiopathic epilepsy and epileptic syndromes, not intractable, without status epilepticus: Secondary | ICD-10-CM | POA: Diagnosis not present

## 2020-05-31 DIAGNOSIS — Z79899 Other long term (current) drug therapy: Secondary | ICD-10-CM | POA: Diagnosis not present

## 2020-05-31 DIAGNOSIS — G43009 Migraine without aura, not intractable, without status migrainosus: Secondary | ICD-10-CM | POA: Diagnosis not present

## 2020-05-31 DIAGNOSIS — M797 Fibromyalgia: Secondary | ICD-10-CM | POA: Diagnosis not present

## 2020-05-31 DIAGNOSIS — G4700 Insomnia, unspecified: Secondary | ICD-10-CM | POA: Diagnosis not present

## 2020-06-05 DIAGNOSIS — G894 Chronic pain syndrome: Secondary | ICD-10-CM | POA: Diagnosis not present

## 2020-06-08 DIAGNOSIS — Z681 Body mass index (BMI) 19 or less, adult: Secondary | ICD-10-CM | POA: Diagnosis not present

## 2020-06-08 DIAGNOSIS — N10 Acute tubulo-interstitial nephritis: Secondary | ICD-10-CM | POA: Diagnosis not present

## 2020-06-21 DIAGNOSIS — M7061 Trochanteric bursitis, right hip: Secondary | ICD-10-CM | POA: Diagnosis not present

## 2020-06-21 DIAGNOSIS — M7600 Gluteal tendinitis, unspecified hip: Secondary | ICD-10-CM | POA: Diagnosis not present

## 2020-06-21 DIAGNOSIS — M419 Scoliosis, unspecified: Secondary | ICD-10-CM | POA: Diagnosis not present

## 2020-06-21 DIAGNOSIS — M9904 Segmental and somatic dysfunction of sacral region: Secondary | ICD-10-CM | POA: Diagnosis not present

## 2020-07-05 DIAGNOSIS — G40909 Epilepsy, unspecified, not intractable, without status epilepticus: Secondary | ICD-10-CM | POA: Diagnosis not present

## 2020-07-05 DIAGNOSIS — G894 Chronic pain syndrome: Secondary | ICD-10-CM | POA: Diagnosis not present

## 2020-07-12 DIAGNOSIS — Z23 Encounter for immunization: Secondary | ICD-10-CM | POA: Diagnosis not present

## 2020-07-17 DIAGNOSIS — M419 Scoliosis, unspecified: Secondary | ICD-10-CM | POA: Diagnosis not present

## 2020-07-17 DIAGNOSIS — G43909 Migraine, unspecified, not intractable, without status migrainosus: Secondary | ICD-10-CM | POA: Diagnosis not present

## 2020-07-17 DIAGNOSIS — M7600 Gluteal tendinitis, unspecified hip: Secondary | ICD-10-CM | POA: Diagnosis not present

## 2020-07-17 DIAGNOSIS — M9904 Segmental and somatic dysfunction of sacral region: Secondary | ICD-10-CM | POA: Diagnosis not present

## 2020-08-04 DIAGNOSIS — G894 Chronic pain syndrome: Secondary | ICD-10-CM | POA: Diagnosis not present

## 2020-08-08 DIAGNOSIS — G43109 Migraine with aura, not intractable, without status migrainosus: Secondary | ICD-10-CM | POA: Diagnosis not present

## 2020-08-08 DIAGNOSIS — Z1331 Encounter for screening for depression: Secondary | ICD-10-CM | POA: Diagnosis not present

## 2020-08-08 DIAGNOSIS — Z681 Body mass index (BMI) 19 or less, adult: Secondary | ICD-10-CM | POA: Diagnosis not present

## 2020-08-14 DIAGNOSIS — G894 Chronic pain syndrome: Secondary | ICD-10-CM | POA: Diagnosis not present

## 2020-08-23 DIAGNOSIS — M797 Fibromyalgia: Secondary | ICD-10-CM | POA: Diagnosis not present

## 2020-08-23 DIAGNOSIS — G40309 Generalized idiopathic epilepsy and epileptic syndromes, not intractable, without status epilepticus: Secondary | ICD-10-CM | POA: Diagnosis not present

## 2020-08-23 DIAGNOSIS — G4700 Insomnia, unspecified: Secondary | ICD-10-CM | POA: Diagnosis not present

## 2020-08-23 DIAGNOSIS — G43701 Chronic migraine without aura, not intractable, with status migrainosus: Secondary | ICD-10-CM | POA: Diagnosis not present

## 2020-08-23 DIAGNOSIS — Z79899 Other long term (current) drug therapy: Secondary | ICD-10-CM | POA: Diagnosis not present

## 2020-08-23 DIAGNOSIS — G609 Hereditary and idiopathic neuropathy, unspecified: Secondary | ICD-10-CM | POA: Diagnosis not present

## 2020-08-28 DIAGNOSIS — M7918 Myalgia, other site: Secondary | ICD-10-CM | POA: Diagnosis not present

## 2020-08-28 DIAGNOSIS — G894 Chronic pain syndrome: Secondary | ICD-10-CM | POA: Diagnosis not present

## 2020-08-28 DIAGNOSIS — M47812 Spondylosis without myelopathy or radiculopathy, cervical region: Secondary | ICD-10-CM | POA: Diagnosis not present

## 2020-09-03 DIAGNOSIS — G894 Chronic pain syndrome: Secondary | ICD-10-CM | POA: Diagnosis not present

## 2020-09-06 DIAGNOSIS — M47812 Spondylosis without myelopathy or radiculopathy, cervical region: Secondary | ICD-10-CM | POA: Diagnosis not present

## 2020-09-11 DIAGNOSIS — G4700 Insomnia, unspecified: Secondary | ICD-10-CM | POA: Diagnosis not present

## 2020-09-11 DIAGNOSIS — F419 Anxiety disorder, unspecified: Secondary | ICD-10-CM | POA: Diagnosis not present

## 2020-09-11 DIAGNOSIS — G43909 Migraine, unspecified, not intractable, without status migrainosus: Secondary | ICD-10-CM | POA: Diagnosis not present

## 2020-09-11 DIAGNOSIS — M47897 Other spondylosis, lumbosacral region: Secondary | ICD-10-CM | POA: Diagnosis not present

## 2020-09-11 DIAGNOSIS — M25559 Pain in unspecified hip: Secondary | ICD-10-CM | POA: Diagnosis not present

## 2020-09-11 DIAGNOSIS — M25519 Pain in unspecified shoulder: Secondary | ICD-10-CM | POA: Diagnosis not present

## 2020-09-11 DIAGNOSIS — G894 Chronic pain syndrome: Secondary | ICD-10-CM | POA: Diagnosis not present

## 2020-09-11 DIAGNOSIS — M48062 Spinal stenosis, lumbar region with neurogenic claudication: Secondary | ICD-10-CM | POA: Diagnosis not present

## 2020-09-20 DIAGNOSIS — M47812 Spondylosis without myelopathy or radiculopathy, cervical region: Secondary | ICD-10-CM | POA: Diagnosis not present

## 2020-10-03 DIAGNOSIS — G894 Chronic pain syndrome: Secondary | ICD-10-CM | POA: Diagnosis not present

## 2020-10-04 DIAGNOSIS — M47812 Spondylosis without myelopathy or radiculopathy, cervical region: Secondary | ICD-10-CM | POA: Diagnosis not present

## 2020-10-09 DIAGNOSIS — G894 Chronic pain syndrome: Secondary | ICD-10-CM | POA: Diagnosis not present

## 2020-10-18 DIAGNOSIS — M47812 Spondylosis without myelopathy or radiculopathy, cervical region: Secondary | ICD-10-CM | POA: Diagnosis not present

## 2020-12-27 ENCOUNTER — Ambulatory Visit (HOSPITAL_COMMUNITY)
Admission: RE | Admit: 2020-12-27 | Discharge: 2020-12-27 | Disposition: A | Discharge status: Home / Self Care | Payer: 59 | Source: Ambulatory Visit | Attending: Physician Assistant | Admitting: Physician Assistant

## 2020-12-27 ENCOUNTER — Other Ambulatory Visit (HOSPITAL_COMMUNITY): Payer: Self-pay | Admitting: Physician Assistant

## 2020-12-27 ENCOUNTER — Ambulatory Visit
Admission: EM | Admit: 2020-12-27 | Discharge: 2020-12-27 | Disposition: A | Discharge status: Home / Self Care | Payer: 59 | Attending: Family Medicine | Admitting: Family Medicine

## 2020-12-27 ENCOUNTER — Other Ambulatory Visit: Payer: Self-pay

## 2020-12-27 DIAGNOSIS — M545 Low back pain, unspecified: Secondary | ICD-10-CM

## 2020-12-27 DIAGNOSIS — N898 Other specified noninflammatory disorders of vagina: Secondary | ICD-10-CM | POA: Insufficient documentation

## 2020-12-27 DIAGNOSIS — N3090 Cystitis, unspecified without hematuria: Secondary | ICD-10-CM | POA: Insufficient documentation

## 2020-12-27 LAB — POCT URINALYSIS DIP (MANUAL ENTRY)
Bilirubin, UA: NEGATIVE
Blood, UA: NEGATIVE
Glucose, UA: NEGATIVE mg/dL
Ketones, POC UA: NEGATIVE mg/dL
Nitrite, UA: NEGATIVE
Protein Ur, POC: NEGATIVE mg/dL
Spec Grav, UA: <=1.005 — AB (ref 1.010–1.025)
Urobilinogen, UA: 0.2 E.U./dL
pH, UA: 6 (ref 5.0–8.0)

## 2020-12-27 MED ORDER — CEPHALEXIN 500 MG PO CAPS: 500.0000 mg | ORAL_CAPSULE | Freq: Two times a day (BID) | ORAL | 0 refills | Status: DC

## 2020-12-27 MED ORDER — FLUCONAZOLE 150 MG PO TABS: ORAL_TABLET | ORAL | 0 refills | Status: DC

## 2020-12-27 NOTE — ED Triage Notes (Signed)
Pt presents with c/o dysuria for past few days

## 2020-12-28 NOTE — ED Provider Notes (Signed)
[place]    ASSESSMENT & PLAN:  1. Cystitis   2. Vaginal itching    Begin: Meds ordered this encounter  Medications   cephALEXin (KEFLEX) 500 MG capsule    Sig: Take 1 capsule (500 mg total) by mouth 2 (two) times daily.    Dispense:  10 capsule    Refill:  0   fluconazole (DIFLUCAN) 150 MG tablet    Sig: Take one tablet by mouth as a single dose. May repeat in 3 days if symptoms persist.    Dispense:  2 tablet    Refill:  0   Declines vaginal cytology. No signs of pyelonephritis. Discussed. Urine culture sent. Will follow up with her PCP or here if not showing improvement over the next 48 hours, sooner if needed.  Outlined signs and symptoms indicating need for more acute intervention. Patient verbalized understanding. After Visit Summary given.  SUBJECTIVE:  Sharon Welch is a 50 y.o. female who complains of urinary frequency, urgency and dysuria for the past few days. Without associated flank pain, fever, chills, vaginal discharge or bleeding. Some vaginal itching; ques yeast infection. Gross hematuria: not present. No specific aggravating or alleviating factors reported. No LE edema. Normal PO intake without n/v/d. Without specific abdominal pain. Ambulatory without difficulty. OTC treatment: none. H/O UTI: occas.  LMP: No LMP recorded. Patient has had a hysterectomy.   OBJECTIVE:  Vitals:   12/27/20 1449  BP: 103/64  Pulse: 94  Resp: 20  Temp: 98.6 F (37 C)  SpO2: 98%   General appearance: alert; no distress Lungs: unlabored respirations Abdomen: soft Back: no CVA tenderness reported Extremities: no edema; symmetrical with no gross deformities Skin: warm and dry Neurologic: normal gait Psychological: alert and cooperative; normal mood and affect  Labs Reviewed  POCT URINALYSIS DIP (MANUAL ENTRY) - Abnormal; Notable for the following components:      Result Value   Spec Grav, UA <=1.005 (*)    Leukocytes, UA Trace (*)    All other  components within normal limits  URINE CULTURE    Allergies  Allergen Reactions   Coconut Flavor Swelling   Tramadol Other (See Comments)    Possible seizures   Aleve [Naproxen] Other (See Comments)    Ulcers   Pineapple Other (See Comments)    Migraine    Past Medical History:  Diagnosis Date   Bruising, spontaneous 12/08/2013   Chronic back pain    Chronic back pain    DDD (degenerative disc disease), lumbar    DDD (degenerative disc disease), lumbar    Fibromyalgia    Headache(784.0)    Ovarian cyst    Partial tear subscapularis tendon 12/27/2011   Scoliosis    Seizures ([place])    started 9/12-   Social History   Socioeconomic History   Marital status: Married    Spouse name: Not on file   Number of children: Not on file   Years of education: Not on file   Highest education level: Not on file  Occupational History   Not on file  Tobacco Use   Smoking status: Every Day    Packs/day: 0.50    Years: 20.00    Pack years: 10.00    Types: Cigarettes   Smokeless tobacco: Never  Vaping Use   Vaping Use: Never used  Substance and Sexual Activity   Alcohol use: No    Alcohol/week: 0.0 standard drinks    Comment: 1-2 sips a years   Drug use: No  Sexual activity: Not on file  Other Topics Concern   Not on file  Social History Narrative   Not on file   Social Determinants of Health   Financial Resource Strain: Not on file  Food Insecurity: Not on file  Transportation Needs: Not on file  Physical Activity: Not on file  Stress: Not on file  Social Connections: Not on file  Intimate Partner Violence: Not on file   Family History  Problem Relation Age of Onset   Hypertension Mother    Hypertension Father    Cancer Father        prostate   Heart disease Father    Hypertension Brother    Hypertension Brother         Vanessa Kick, MD 12/28/20 [phone]

## 2020-12-29 LAB — URINE CULTURE: Culture: 10000 — AB

## 2021-01-28 ENCOUNTER — Other Ambulatory Visit: Payer: Self-pay

## 2021-01-28 ENCOUNTER — Encounter (HOSPITAL_COMMUNITY): Payer: Self-pay | Admitting: Emergency Medicine

## 2021-01-28 ENCOUNTER — Emergency Department (HOSPITAL_COMMUNITY)
Admission: EM | Admit: 2021-01-28 | Discharge: 2021-01-29 | Disposition: A | Discharge status: Home / Self Care | Payer: 59 | Attending: Emergency Medicine | Admitting: Emergency Medicine

## 2021-01-28 DIAGNOSIS — F17210 Nicotine dependence, cigarettes, uncomplicated: Secondary | ICD-10-CM | POA: Insufficient documentation

## 2021-01-28 DIAGNOSIS — R519 Headache, unspecified: Secondary | ICD-10-CM | POA: Diagnosis present

## 2021-01-28 DIAGNOSIS — G43009 Migraine without aura, not intractable, without status migrainosus: Secondary | ICD-10-CM | POA: Insufficient documentation

## 2021-01-28 DIAGNOSIS — R569 Unspecified convulsions: Secondary | ICD-10-CM

## 2021-01-28 DIAGNOSIS — G40909 Epilepsy, unspecified, not intractable, without status epilepticus: Secondary | ICD-10-CM | POA: Insufficient documentation

## 2021-01-28 NOTE — ED Triage Notes (Addendum)
Pt reports "staring seizure" at home lasting approx 45sec. PMH seizures. Per pt manual bp at home was 90/60 in both arms. 127/93 in triage. C/o headache and lightheadedness.

## 2021-01-29 DIAGNOSIS — G43009 Migraine without aura, not intractable, without status migrainosus: Secondary | ICD-10-CM | POA: Diagnosis not present

## 2021-01-29 MED ORDER — PROCHLORPERAZINE EDISYLATE 10 MG/2ML IJ SOLN
10.0000 mg | Freq: Once | INTRAMUSCULAR | Status: AC
  Administered 2021-01-29: 10 mg via INTRAMUSCULAR
  Filled 2021-01-29: qty 2

## 2021-01-29 MED ORDER — PREDNISONE 20 MG PO TABS: 40.0000 mg | ORAL_TABLET | Freq: Every day | ORAL | 0 refills | Status: DC

## 2021-01-29 MED ORDER — LORAZEPAM 1 MG PO TABS
1.0000 mg | ORAL_TABLET | Freq: Once | ORAL | Status: AC
  Administered 2021-01-29: 1 mg via ORAL
  Filled 2021-01-29: qty 1

## 2021-01-29 MED ORDER — KETOROLAC TROMETHAMINE 60 MG/2ML IM SOLN
60.0000 mg | Freq: Once | INTRAMUSCULAR | Status: AC
  Administered 2021-01-29: 60 mg via INTRAMUSCULAR
  Filled 2021-01-29: qty 2

## 2021-01-29 NOTE — ED Notes (Signed)
ED Provider at bedside. 

## 2021-01-29 NOTE — ED Provider Notes (Signed)
[facility] EMERGENCY DEPARTMENT Provider Note   CSN: [id number] Arrival date & time: 01/28/21  2134     History Chief Complaint  Patient presents with   Hypotension    Sharon Welch is a 50 y.o. female.  Patient presents to the emergency department for evaluation of multiple problems.  Patient reports that she has a seizure disorder.  She had an absence type seizure tonight.  She does have these from time to time, takes Topamax and sees Dr. Merlene Laughter.  After the seizure, she developed dizziness and a headache.  The headache is typical for her after her seizures.  Her daughter took her blood pressure and it was 90/60 in both arms.    Patient still complaining of persistent frontal headache with light sensitivity.  This is consistent with her previous migraines that she gets after a seizure.  While in the waiting room she did have some tightness in her chest.  This was related to wheezing and resolved when she used her albuterol inhaler.      Past Medical History:  Diagnosis Date   Bruising, spontaneous 12/08/2013   Chronic back pain    Chronic back pain    DDD (degenerative disc disease), lumbar    DDD (degenerative disc disease), lumbar    Fibromyalgia    Headache(784.0)    Ovarian cyst    Partial tear subscapularis tendon 12/27/2011   Scoliosis    Seizures ([place])    started 9/12-    Patient Active Problem List   Diagnosis Date Noted   Acute GI bleed due to bleeding pyloric/gastric ulcer 05/15/2019   PUD (peptic ulcer disease)-EGD 05/15/2019 with bleeding pyloric/gastric ulcer, nonbleeding duodenal ulcers and gastritis    Hematemesis with nausea    Migraine headache 09/25/2015   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   Bilateral occipital neuralgia 01/02/2015   Migraine 01/02/2015   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/2014   DDD (degenerative disc disease), cervical 10/27/2014   DDD (degenerative disc disease), 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   DDD (degenerative disc disease),  lumbosacral 10/27/2014   Partial tear subscapularis tendon 12/27/2011    Past Surgical History:  Procedure Laterality Date   ABDOMINAL HYSTERECTOMY  2012   BACK SURGERY  1989   scoliosis throsic-rods   CESAREAN SECTION     CHOLECYSTECTOMY N/A 11/17/2012   Procedure: LAPAROSCOPIC CHOLECYSTECTOMY WITH INTRAOPERATIVE CHOLANGIOGRAM;  Surgeon: Ralene Ok, MD;  Location: [facility];  Service: General;  Laterality: N/A;   COLONOSCOPY WITH PROPOFOL N/A 04/21/2020   Procedure: COLONOSCOPY WITH PROPOFOL;  Surgeon: Carol Ada, MD;  Location: [facility];  Service: Endoscopy;  Laterality: N/A;   ENDOSCOPIC RETROGRADE CHOLANGIOPANCREATOGRAPHY (ERCP) WITH PROPOFOL N/A 06/11/2019   Procedure: ENDOSCOPIC RETROGRADE CHOLANGIOPANCREATOGRAPHY (ERCP) WITH PROPOFOL;  Surgeon: Carol Ada, MD;  Location: [facility];  Service: Endoscopy;  Laterality: N/A;   ERCP N/A 05/07/2013   Procedure: ENDOSCOPIC RETROGRADE CHOLANGIOPANCREATOGRAPHY (ERCP);  Surgeon: Beryle Beams, MD;  Location: [facility];  Service: Endoscopy;  Laterality: N/A;  start ercp first, if canulation fails switch to EUS    ESOPHAGOGASTRODUODENOSCOPY (EGD) WITH PROPOFOL N/A 05/15/2019   Procedure: ESOPHAGOGASTRODUODENOSCOPY (EGD) WITH PROPOFOL;  Surgeon: Danie Binder, MD;  [facility];  Service: Endoscopy;  Laterality: N/A;   ESOPHAGOGASTRODUODENOSCOPY (EGD) WITH PROPOFOL N/A 03/03/2020   Procedure: ESOPHAGOGASTRODUODENOSCOPY (EGD) WITH PROPOFOL;  Surgeon: Carol Ada, MD;  Location: [facility];  Service: Endoscopy;  Laterality: N/A;   ESOPHAGOGASTRODUODENOSCOPY (EGD) WITH PROPOFOL N/A 04/21/2020   Procedure: ESOPHAGOGASTRODUODENOSCOPY (EGD) WITH PROPOFOL;  Surgeon: Benson Norway,  Saralyn Pilar, MD;  Location: [facility];  Service: Endoscopy;  Laterality: N/A;   EUS N/A 11/03/2012   Procedure: UPPER ENDOSCOPIC ULTRASOUND (EUS) LINEAR;  Surgeon: Beryle Beams, MD;  Location: [facility];  Service: Endoscopy;  Laterality: N/A;   left  arm     NECK SURGERY     POLYPECTOMY  04/21/2020   Procedure: POLYPECTOMY;  Surgeon: Carol Ada, MD;  Location: [facility];  Service: Endoscopy;;   REMOVAL OF STONES  06/11/2019   Procedure: REMOVAL OF STONES;  Surgeon: Carol Ada, MD;  [facility]: [facility];  Service: Endoscopy;;   TUBAL LIGATION     UPPER ESOPHAGEAL ENDOSCOPIC ULTRASOUND (EUS) N/A 06/11/2019   Procedure: UPPER ESOPHAGEAL ENDOSCOPIC ULTRASOUND (EUS);  Surgeon: Carol Ada, MD;  Location: [facility];  Service: Endoscopy;  Laterality: N/A;     OB History     Gravida      Para      Term      Preterm      AB      Living  2      SAB      IAB      Ectopic      Multiple      Live Births              Family History  Problem Relation Age of Onset   Hypertension Mother    Hypertension Father    Cancer Father        prostate   Heart disease Father    Hypertension Brother    Hypertension Brother     Social History   Tobacco Use   Smoking status: Every Day    Packs/day: 0.50    Years: 20.00    Pack years: 10.00    Types: Cigarettes   Smokeless tobacco: Never  Vaping Use   Vaping Use: Never used  Substance Use Topics   Alcohol use: No    Alcohol/week: 0.0 standard drinks    Comment: 1-2 sips a years   Drug use: No    Home Medications Prior to Admission medications   Medication Sig Start Date End Date Taking? Authorizing Provider  predniSONE (DELTASONE) 20 MG tablet Take 2 tablets (40 mg total) by mouth daily with breakfast. 01/29/21  Yes Prapti Grussing, Gwenyth Allegra, MD  albuterol (VENTOLIN HFA) 108 (90 Base) MCG/ACT inhaler Inhale 1-2 puffs into the lungs every 4 (four) hours as needed for wheezing or shortness of breath.    [provider]  amitriptyline (ELAVIL) 25 MG tablet Take 25 mg by mouth at bedtime.     [provider]  cephALEXin (KEFLEX) 500 MG capsule Take 1 capsule (500 mg total) by mouth 2 (two) times daily. 12/27/20   Vanessa Kick, MD   dexlansoprazole (DEXILANT) 60 MG capsule Take 60 mg by mouth daily.    [provider]  fluconazole (DIFLUCAN) 150 MG tablet Take one tablet by mouth as a single dose. May repeat in 3 days if symptoms persist. 12/27/20   Vanessa Kick, MD  HYDROcodone-acetaminophen (NORCO) 10-325 MG tablet Take 1 tablet by mouth every 4 (four) hours as needed for moderate pain or severe pain.    [provider]  linaclotide (LINZESS) 145 MCG CAPS capsule Take 145 mcg by mouth daily before breakfast.    [provider]  meloxicam (MOBIC) 7.5 MG tablet Take 7.5 mg by mouth 2 (two) times daily as needed for pain.     [provider]  Milnacipran (SAVELLA) 50 MG  TABS tablet Take 50 mg by mouth 2 (two) times daily.    [provider]  promethazine (PHENERGAN) 25 MG tablet Take 25 mg by mouth every 4 (four) hours as needed for nausea or vomiting.     [provider]  rizatriptan (MAXALT) 10 MG tablet Take 10 mg by mouth as needed for migraine. May repeat in 2 hours if needed    [provider]  tiZANidine (ZANAFLEX) 2 MG tablet Take 2 mg by mouth 4 (four) times daily as needed for muscle spasms.     [provider]  topiramate (TOPAMAX) 50 MG tablet Take 50 mg by mouth 2 (two) times daily.    [provider]  traZODone (DESYREL) 100 MG tablet Take 100 mg by mouth at bedtime.    [provider]    Allergies    Coconut flavor, Tramadol, Aleve [naproxen], and Pineapple  Review of Systems   Review of Systems  Respiratory:  Positive for chest tightness and wheezing.   Neurological:  Positive for dizziness, seizures and headaches.  All other systems reviewed and are negative.  Physical Exam Updated Vital Signs BP 129/86   Pulse 84   Temp 98.2 F (36.8 C) (Oral)   Resp 18   Ht 5' (1.524 m)   Wt 42.6 kg   SpO2 100%   BMI 18.36 kg/m   Physical Exam Vitals and nursing note reviewed.  Constitutional:      General: She is  not in acute distress.    Appearance: Normal appearance. She is well-developed.  HENT:     Head: Normocephalic and atraumatic.     Right Ear: Hearing normal.     Left Ear: Hearing normal.     Nose: Nose normal.  Eyes:     Conjunctiva/sclera: Conjunctivae normal.     Pupils: Pupils are equal, round, and reactive to light.  Cardiovascular:     Rate and Rhythm: Regular rhythm.     Heart sounds: S1 normal and S2 normal. No murmur heard.   No friction rub. No gallop.  Pulmonary:     Effort: Pulmonary effort is normal. No respiratory distress.     Breath sounds: Normal breath sounds.  Chest:     Chest wall: No tenderness.  Abdominal:     General: Bowel sounds are normal.     Palpations: Abdomen is soft.     Tenderness: There is no abdominal tenderness. There is no guarding or rebound. Negative signs include Murphy's sign and McBurney's sign.     Hernia: No hernia is present.  Musculoskeletal:        General: Normal range of motion.     Cervical back: Normal range of motion and neck supple.  Skin:    General: Skin is warm and dry.     Findings: No rash.  Neurological:     Mental Status: She is alert and oriented to person, place, and time.     GCS: GCS eye subscore is 4. GCS verbal subscore is 5. GCS motor subscore is 6.     Cranial Nerves: No cranial nerve deficit.     Sensory: No sensory deficit.     Coordination: Coordination normal.  Psychiatric:        Speech: Speech normal.        Behavior: Behavior normal.        Thought Content: Thought content normal.    ED Results / Procedures / Treatments   Labs (all labs ordered are listed, but only  abnormal results are displayed) Labs Reviewed - No data to display  EKG EKG Interpretation  Date/Time:  Monday January 29 2021 01:48:54 EDT Ventricular Rate:  89 PR Interval:  140 QRS Duration: 96 QT Interval:  387 QTC Calculation: 471 R Axis:   -72 Text Interpretation: Sinus rhythm LAD, consider left anterior fascicular block  Abnormal R-wave progression, early transition Baseline wander in lead(s) V4 Confirmed by Orpah Greek [phone]) on 01/29/2021 1:59:46 AM  Radiology No results found.  Procedures Procedures   Medications Ordered in ED Medications  LORazepam (ATIVAN) tablet 1 mg (1 mg Oral Given 01/29/21 0149)  ketorolac (TORADOL) injection 60 mg (60 mg Intramuscular Given 01/29/21 0150)  prochlorperazine (COMPAZINE) injection 10 mg (10 mg Intramuscular Given 01/29/21 0150)    ED Course  I have reviewed the triage vital signs and the nursing notes.  Pertinent labs & imaging results that were available during my care of the patient were reviewed by me and considered in my medical decision making (see chart for details).    MDM Rules/Calculators/A&P                           Patient presents to the emergency department with multiple complaints.  She is here primarily with concerns over low blood pressure after she had a typical focal seizure at home earlier tonight.  Blood pressures have been normal here in the department.  Patient complaining of headache which is typical for her after her seizures, labeled migraines.  She took her "migraine tablet" at home and still has a headache.  We will treat for acute migraine.  Patient also had some mild wheezing that resolved with albuterol.  Will offer prednisone, may help with her headaches as well as her asthma.  She is taking her Topamax.  Reviewing records from neurology shows that she was previously on Keppra but stopped it on her own.  Will refer back to Dr. Merlene Laughter.  Final Clinical Impression(s) / ED Diagnoses Final diagnoses:  Seizure ([place])  Migraine without aura and without status migrainosus, not intractable    Rx / DC Orders ED Discharge Orders          Ordered    predniSONE (DELTASONE) 20 MG tablet  Daily with breakfast        01/29/21 0126             Orpah Greek, MD 01/29/21 0200

## 2021-04-24 IMAGING — CT CT ABD-PELV W/ CM
2 of 9 series · 11 of 46 positions shown, 17 images · IV contrast (Omnipaque or Isovue)
Comparison: CT 05/08/2013

CLINICAL DATA: Acute generalized abdominal pain. Patient reports
vomiting blood.

EXAM:
CT ABDOMEN AND PELVIS WITH CONTRAST
TECHNIQUE: Multidetector CT imaging of the abdomen and pelvis was performed
using the standard protocol following bolus administration of
intravenous contrast.
CONTRAST:  100mL OMNIPAQUE IOHEXOL 300 MG/ML  SOLN

[Series 2: axial st · axial · 0.62mm/px · z∈[-559,-264]mm · 8 of 77 slices shown, 13 images]
[im 9/77  soft-tissue]
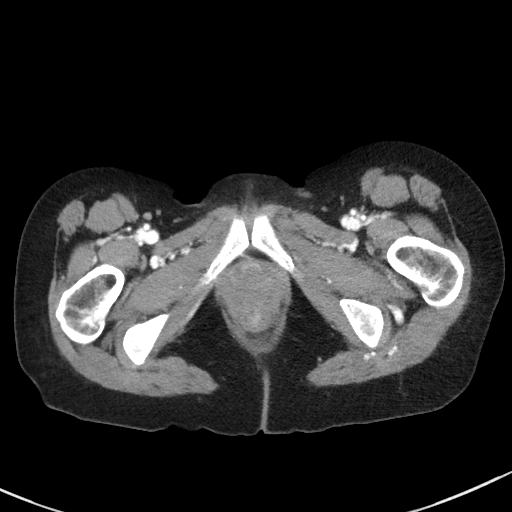
[im 9/77  bone]
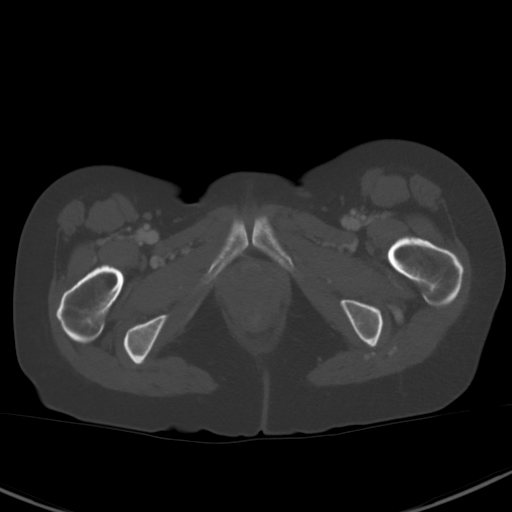
[im 17/77  soft-tissue]
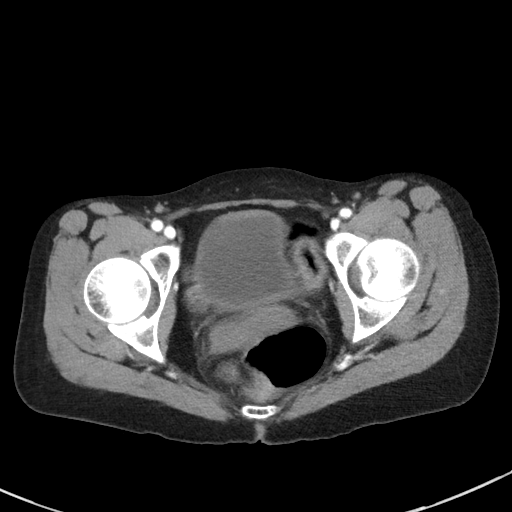
[im 26/77  soft-tissue]
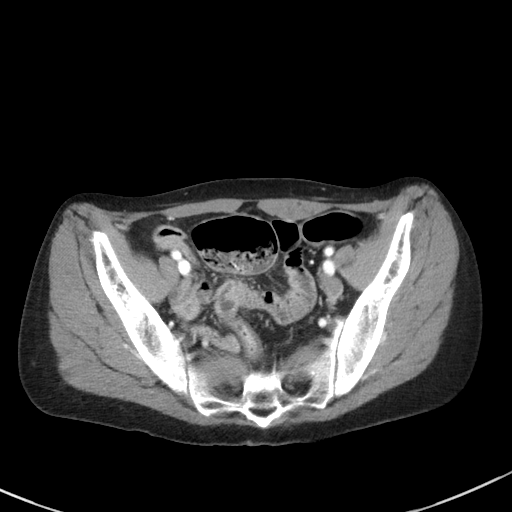
[im 34/77  soft-tissue]
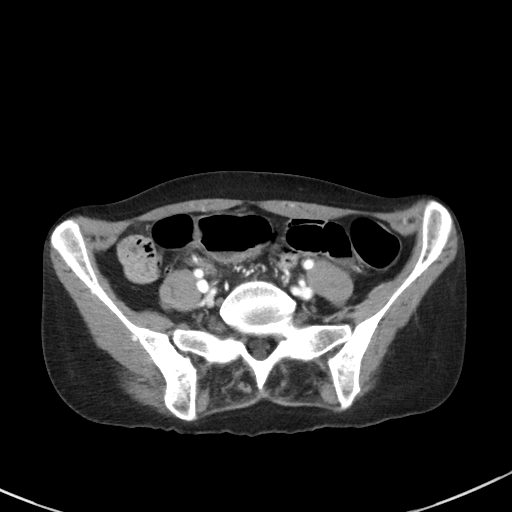
[im 43/77  soft-tissue]
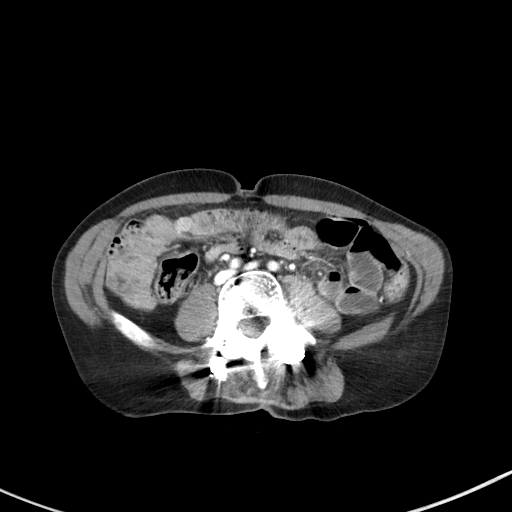
[im 43/77  lung]
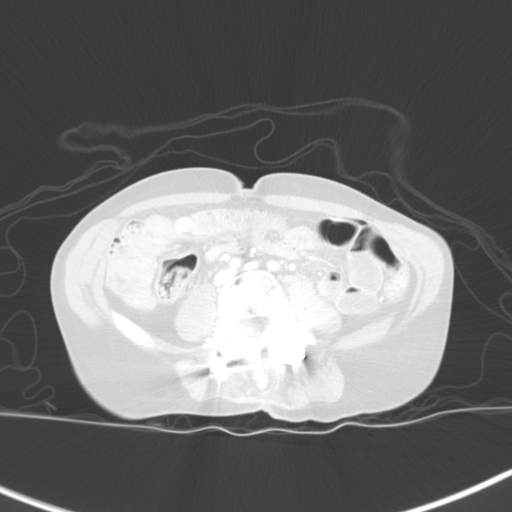
[im 51/77  soft-tissue]
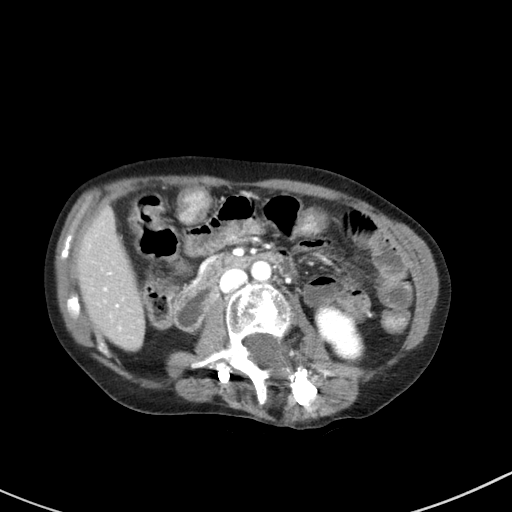
[im 51/77  lung]
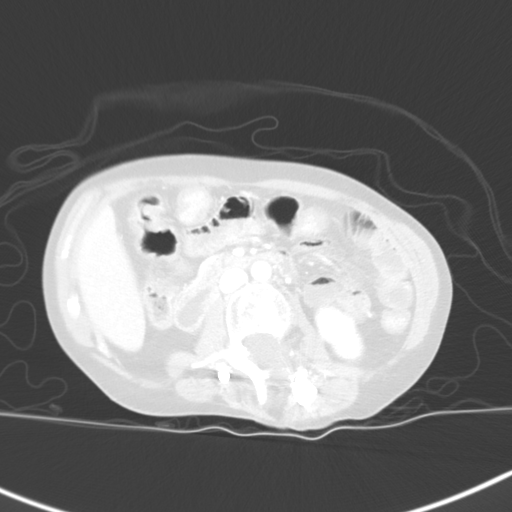
[im 60/77  soft-tissue]
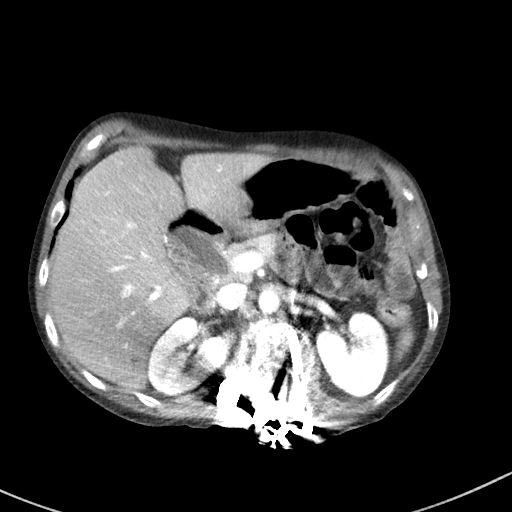
[im 60/77  lung]
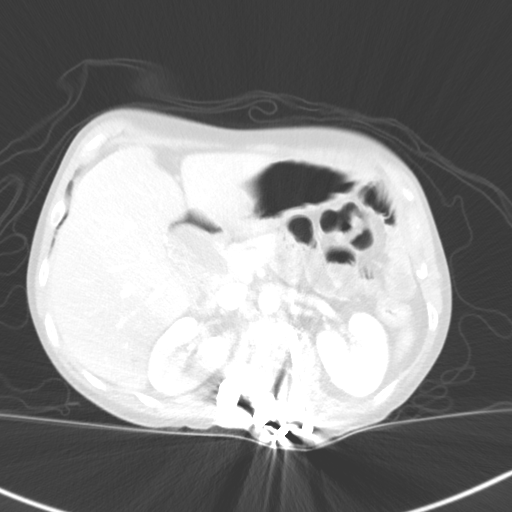
[im 68/77  soft-tissue]
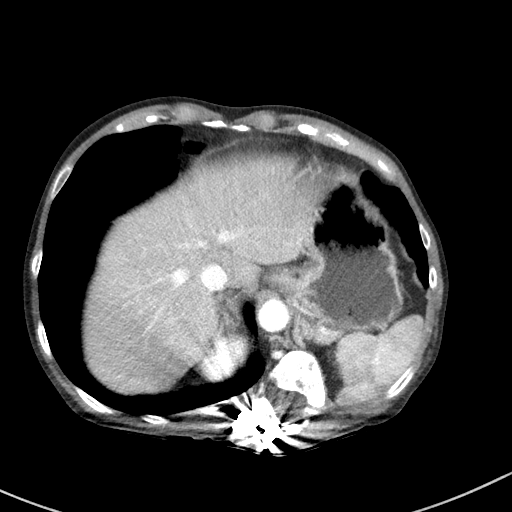
[im 68/77  lung]
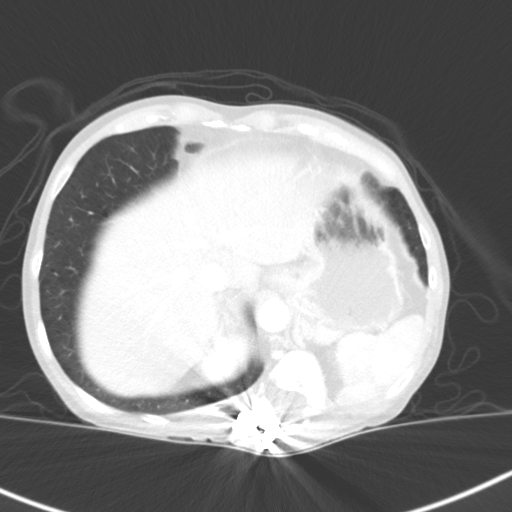

[Series 5: coronal st · coronal · 0.68mm/px · 3 of 76 slices shown, 4 images]
[im 19/76  soft-tissue]
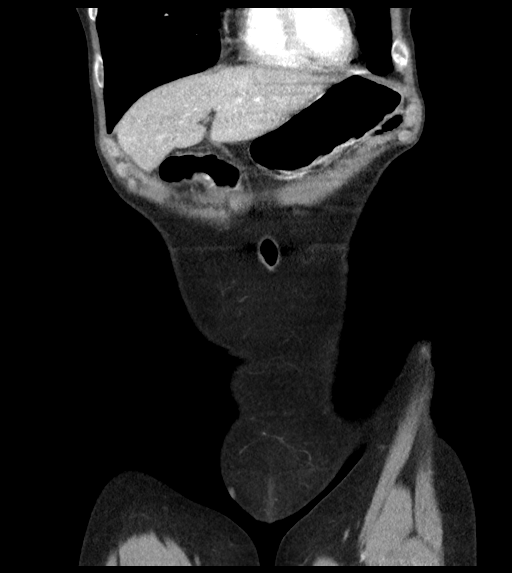
[im 38/76  soft-tissue]
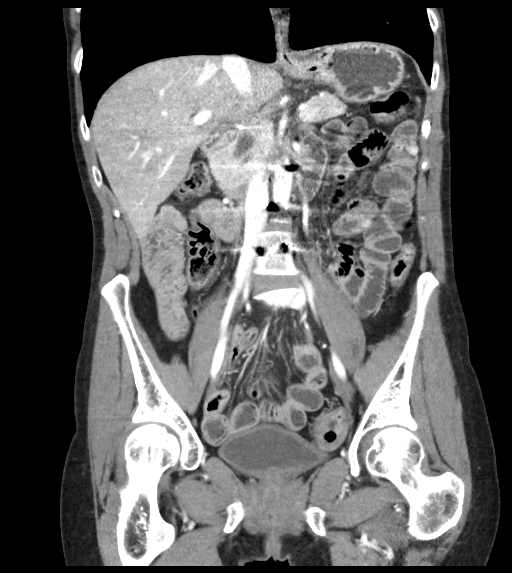
[im 38/76  bone]
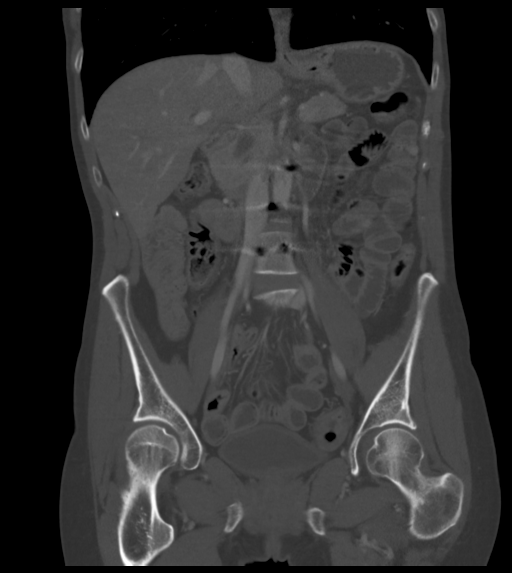
[im 57/76  soft-tissue]
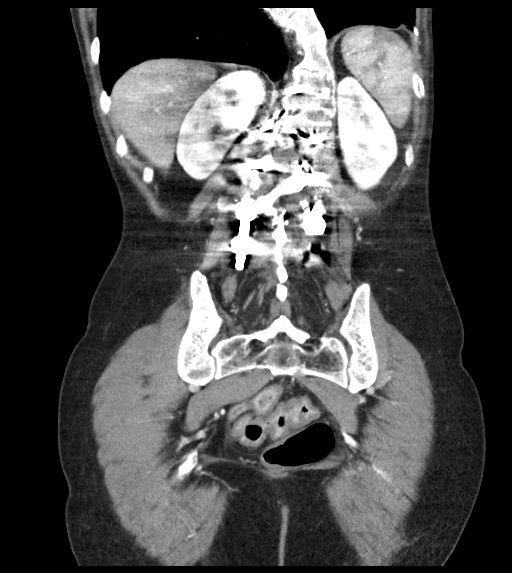

[11 of 46 positions shown; findings below may reference images not displayed]

FINDINGS: Lower chest: Emphysema. No acute airspace disease or pleural fluid.

Hepatobiliary: 7 mm cyst in the right lobe of the liver. Additional
tiny hepatic hypodensities that are too small to characterize.
Postcholecystectomy. Common bile duct is dilated at 13 mm. Mild
central intrahepatic biliary ductal dilatation. No visualized
choledocholithiasis.

Pancreas: No ductal dilatation or inflammation.

Spleen: Normal in size without focal abnormality. Heterogeneity on
initial imaging normal for phase of IV contrast.

Adrenals/Urinary Tract: No adrenal nodule. No hydronephrosis or
perinephric edema. Small cyst in the left mid kidney. Urinary
bladder is partially distended, questionable mild bladder wall
thickening.

Stomach/Bowel: Bowel evaluation is limited in the absence of enteric
contrast and paucity of intra-abdominal fat. Peri pyloric gastric
wall thickening versus peristalsis. Fluid-filled small bowel without
obstruction. Questionable mild wall hyperemia of pelvic small bowel
loops. Normal appendix. Mild colonic wall thickening of sigmoid
colon.

Vascular/Lymphatic: Abdominal aorta is normal in caliber. The portal
vein is patent. No adenopathy in the abdomen or pelvis.

Reproductive: Status post hysterectomy. No adnexal masses.

Other: No free air, free fluid, or intra-abdominal fluid collection.

Musculoskeletal: Scoliosis with postsurgical change of the
thoracolumbar spine. There are no acute or suspicious osseous
abnormalities.
IMPRESSION: 1. Postcholecystectomy with intra and extrahepatic biliary ductal
dilatation. No visualized choledocholithiasis. Recommend correlation
with LFTs. If LFTs are elevated, recommend further evaluation with
MRCP or ERCP.
2. Mild small bowel hyperemia of pelvic bowel loops, with mild
adjacent sigmoid colonic wall thickening. Findings suggest mild
generalized enteritis/colitis.

Aortic Atherosclerosis (LHHXY-XDF.F) and Emphysema (LHHXY-SNG.O).

## 2022-05-21 NOTE — H&P (Signed)
   Patient: Sharon Welch  PID: [id number]  DOB: [birth year]  SEX: Female   Patient referred by  DDS for extraction all remaining teeth.  CC: Painful teeth.  Past Medical History:  Hay Fever or Sinus Problems, Blood transfusion, gallbladder removed, Arthritis, Stomach Ulcers, Epilepsy, Pain Management    Medications: Linzess, Tizanidine, Hydroxyzine, Savella, Folic acid, Amitriptyline, topiramate, Promethazine, Rizatriptan, Diclofenac, Hydrocodone    Allergies:     None    Surgeries:   gallbladder removal     Social History       Smoking:  1 ppd          Alcohol:n Drug use:n                             Exam: BMI  19. Multiple caries all remaining teeth # 2, 4, 6, 7, 8, 9, 10, 11, 14, 15, 19, 20, 21, 22, 23, 24, 25, 26, 27, 28, 29, 30, bilateral mandibular lingual tori. No purulence, edema, fluctuance, trismus. Oral cancer screening negative. Pharynx clear. No lymphadenopathy.  Panorex:Multiple caries all remaining teeth # 2, 4, 6, 7, 8, 9, 10, 11, 14, 15, 19, 20, 21, 22, 23, 24, 25, 26, 27, 28, 29, 30,  Assessment: ASA 3 . Non-restorable teeth # 2, 4, 6, 7, 8, 9, 10, 11, 14, 15, 19, 20, 21, 22, 23, 24, 25, 26, 27, 28, 29, 30, bilateral mandibular lingual tori.               Plan: 1. MD clearance          2. Extraction Teeth #  2, 4, 6, 7, 8, 9, 10, 11, 14, 15, 19, 20, 21, 22, 23, 24, 25, 26, 27, 28, 29, 30,alveoloplasty, removal bilateral mandibular lingual tori. Hospital Day surgery.                 Rx: n               Risks and complications explained. Questions answered.   Gae Bon, DMD

## 2022-05-23 ENCOUNTER — Other Ambulatory Visit: Payer: Self-pay

## 2022-05-23 ENCOUNTER — Encounter (HOSPITAL_COMMUNITY): Payer: Self-pay | Admitting: Oral Surgery

## 2022-05-23 NOTE — Progress Notes (Signed)
Spoke with pt for pre-op call. Pt denies cardiac history, HTN or Diabetes.   Shower instructions given to pt and she voiced understanding.  

## 2022-05-23 NOTE — Anesthesia Preprocedure Evaluation (Addendum)
Anesthesia Evaluation  Patient identified by MRN, date of birth, ID band Patient awake    Reviewed: Allergy & Precautions, NPO status , Patient's Chart, lab work & pertinent test results  Airway Mallampati: I   Neck ROM: Full    Dental  (+) Poor Dentition   Pulmonary asthma , Current Smoker and Patient abstained from smoking.   Pulmonary exam normal        Cardiovascular negative cardio ROS Normal cardiovascular exam     Neuro/Psych  Headaches, Seizures -, Well Controlled,   negative psych ROS   GI/Hepatic Neg liver ROS,,,  Endo/Other  negative endocrine ROS    Renal/GU negative Renal ROS  negative genitourinary   Musculoskeletal  (+) Arthritis , Osteoarthritis,  Fibromyalgia -  Abdominal Normal abdominal exam  (+)   Peds  Hematology negative hematology ROS (+)   Anesthesia Other Findings   Reproductive/Obstetrics                             Anesthesia Physical Anesthesia Plan  ASA: 2  Anesthesia Plan: General   Post-op Pain Management: Minimal or no pain anticipated   Induction: Intravenous  PONV Risk Score and Plan: 2 and Treatment may vary due to age or medical condition, Ondansetron, Dexamethasone and Midazolam  Airway Management Planned: Nasal ETT  Additional Equipment: None  Intra-op Plan:   Post-operative Plan: Extubation in OR  Informed Consent: I have reviewed the patients History and Physical, chart, labs and discussed the procedure including the risks, benefits and alternatives for the proposed anesthesia with the patient or authorized representative who has indicated his/her understanding and acceptance.     Dental advisory given  Plan Discussed with: CRNA  Anesthesia Plan Comments:         Anesthesia Quick Evaluation

## 2022-05-24 ENCOUNTER — Encounter (HOSPITAL_COMMUNITY)
Admission: RE | Disposition: A | Discharge status: Home / Self Care | Payer: Self-pay | Source: Ambulatory Visit | Attending: Oral Surgery

## 2022-05-24 ENCOUNTER — Ambulatory Visit (HOSPITAL_COMMUNITY): Payer: 59 | Admitting: Anesthesiology

## 2022-05-24 ENCOUNTER — Other Ambulatory Visit: Payer: Self-pay

## 2022-05-24 ENCOUNTER — Encounter (HOSPITAL_COMMUNITY): Payer: Self-pay | Admitting: Oral Surgery

## 2022-05-24 ENCOUNTER — Ambulatory Visit (HOSPITAL_BASED_OUTPATIENT_CLINIC_OR_DEPARTMENT_OTHER): Payer: 59 | Admitting: Anesthesiology

## 2022-05-24 ENCOUNTER — Ambulatory Visit (HOSPITAL_COMMUNITY)
Admission: RE | Admit: 2022-05-24 | Discharge: 2022-05-24 | Disposition: A | Discharge status: Home / Self Care | Payer: 59 | Source: Ambulatory Visit | Attending: Oral Surgery | Admitting: Oral Surgery

## 2022-05-24 DIAGNOSIS — G40909 Epilepsy, unspecified, not intractable, without status epilepticus: Secondary | ICD-10-CM | POA: Insufficient documentation

## 2022-05-24 DIAGNOSIS — M278 Other specified diseases of jaws: Secondary | ICD-10-CM | POA: Diagnosis not present

## 2022-05-24 DIAGNOSIS — M797 Fibromyalgia: Secondary | ICD-10-CM | POA: Diagnosis not present

## 2022-05-24 DIAGNOSIS — J45909 Unspecified asthma, uncomplicated: Secondary | ICD-10-CM | POA: Diagnosis not present

## 2022-05-24 DIAGNOSIS — M1990 Unspecified osteoarthritis, unspecified site: Secondary | ICD-10-CM | POA: Diagnosis not present

## 2022-05-24 DIAGNOSIS — K029 Dental caries, unspecified: Secondary | ICD-10-CM

## 2022-05-24 DIAGNOSIS — Z79899 Other long term (current) drug therapy: Secondary | ICD-10-CM | POA: Insufficient documentation

## 2022-05-24 DIAGNOSIS — F17200 Nicotine dependence, unspecified, uncomplicated: Secondary | ICD-10-CM | POA: Insufficient documentation

## 2022-05-24 DIAGNOSIS — K0889 Other specified disorders of teeth and supporting structures: Secondary | ICD-10-CM | POA: Diagnosis not present

## 2022-05-24 HISTORY — DX: Malignant (primary) neoplasm, unspecified: C80.1

## 2022-05-24 HISTORY — PX: TOOTH EXTRACTION: SHX859

## 2022-05-24 LAB — BASIC METABOLIC PANEL
Anion gap: 6 (ref 5–15)
BUN: 7 mg/dL (ref 6–20)
CO2: 25 mmol/L (ref 22–32)
Calcium: 9.4 mg/dL (ref 8.9–10.3)
Chloride: 108 mmol/L (ref 98–111)
Creatinine, Ser: 0.87 mg/dL (ref 0.44–1.00)
GFR, Estimated: >60 mL/min (ref >60)
Glucose, Bld: 100 mg/dL — ABNORMAL HIGH (ref 70–99)
Potassium: 3.9 mmol/L (ref 3.5–5.1)
Sodium: 139 mmol/L (ref 135–145)

## 2022-05-24 LAB — CBC
HCT: 41.6 % (ref 36.0–46.0)
Hemoglobin: 13.7 g/dL (ref 12.0–15.0)
MCH: 31.6 pg (ref 26.0–34.0)
MCHC: 32.9 g/dL (ref 30.0–36.0)
MCV: 95.9 fL (ref 80.0–100.0)
Platelets: 282 10*3/uL (ref 150–400)
RBC: 4.34 MIL/uL (ref 3.87–5.11)
RDW: 12.6 % (ref 11.5–15.5)
WBC: 11 10*3/uL — ABNORMAL HIGH (ref 4.0–10.5)
nRBC: 0 % (ref 0.0–0.2)

## 2022-05-24 SURGERY — DENTAL RESTORATION/EXTRACTIONS
Anesthesia: General | Site: Mouth

## 2022-05-24 MED ORDER — PHENYLEPHRINE 80 MCG/ML (10ML) SYRINGE FOR IV PUSH (FOR BLOOD PRESSURE SUPPORT)
PREFILLED_SYRINGE | INTRAVENOUS | Status: DC | PRN
  Administered 2022-05-24 (×4): 80 ug via INTRAVENOUS

## 2022-05-24 MED ORDER — MEPERIDINE HCL 25 MG/ML IJ SOLN: 6.2500 mg | INTRAMUSCULAR | Status: DC | PRN

## 2022-05-24 MED ORDER — LIDOCAINE-EPINEPHRINE 2 %-1:100000 IJ SOLN
INTRAMUSCULAR | Status: DC | PRN
  Administered 2022-05-24: 12 mL

## 2022-05-24 MED ORDER — FENTANYL CITRATE (PF) 250 MCG/5ML IJ SOLN
INTRAMUSCULAR | Status: AC
  Filled 2022-05-24: qty 5

## 2022-05-24 MED ORDER — MIDAZOLAM HCL 2 MG/2ML IJ SOLN
INTRAMUSCULAR | Status: AC
  Filled 2022-05-24: qty 2

## 2022-05-24 MED ORDER — DEXAMETHASONE SODIUM PHOSPHATE 10 MG/ML IJ SOLN
INTRAMUSCULAR | Status: AC
  Filled 2022-05-24: qty 1

## 2022-05-24 MED ORDER — ROCURONIUM BROMIDE 10 MG/ML (PF) SYRINGE
PREFILLED_SYRINGE | INTRAVENOUS | Status: DC | PRN
  Administered 2022-05-24: 50 mg via INTRAVENOUS

## 2022-05-24 MED ORDER — ONDANSETRON HCL 4 MG/2ML IJ SOLN
INTRAMUSCULAR | Status: AC
  Filled 2022-05-24: qty 2

## 2022-05-24 MED ORDER — CHLORHEXIDINE GLUCONATE 0.12 % MT SOLN
15.0000 mL | Freq: Once | OROMUCOSAL | Status: AC
  Administered 2022-05-24: 15 mL via OROMUCOSAL
  Filled 2022-05-24: qty 15

## 2022-05-24 MED ORDER — ACETAMINOPHEN 10 MG/ML IV SOLN: 1000.0000 mg | Freq: Once | INTRAVENOUS | Status: DC | PRN

## 2022-05-24 MED ORDER — MIDAZOLAM HCL 2 MG/2ML IJ SOLN
INTRAMUSCULAR | Status: DC | PRN
  Administered 2022-05-24: 2 mg via INTRAVENOUS

## 2022-05-24 MED ORDER — SODIUM CHLORIDE 0.9 % IR SOLN
Status: DC | PRN
  Administered 2022-05-24: 1000 mL

## 2022-05-24 MED ORDER — FENTANYL CITRATE (PF) 100 MCG/2ML IJ SOLN: 25.0000 ug | INTRAMUSCULAR | Status: DC | PRN

## 2022-05-24 MED ORDER — FENTANYL CITRATE (PF) 250 MCG/5ML IJ SOLN
INTRAMUSCULAR | Status: DC | PRN
  Administered 2022-05-24: 100 ug via INTRAVENOUS
  Administered 2022-05-24: 50 ug via INTRAVENOUS

## 2022-05-24 MED ORDER — ONDANSETRON HCL 4 MG/2ML IJ SOLN
INTRAMUSCULAR | Status: DC | PRN
  Administered 2022-05-24: 4 mg via INTRAVENOUS

## 2022-05-24 MED ORDER — LACTATED RINGERS IV SOLN: INTRAVENOUS | Status: DC

## 2022-05-24 MED ORDER — ONDANSETRON HCL 4 MG/2ML IJ SOLN: 4.0000 mg | Freq: Once | INTRAMUSCULAR | Status: DC | PRN

## 2022-05-24 MED ORDER — OXYCODONE HCL 5 MG PO TABS: 5.0000 mg | ORAL_TABLET | Freq: Once | ORAL | Status: DC | PRN

## 2022-05-24 MED ORDER — ROCURONIUM BROMIDE 10 MG/ML (PF) SYRINGE
PREFILLED_SYRINGE | INTRAVENOUS | Status: AC
  Filled 2022-05-24: qty 10

## 2022-05-24 MED ORDER — ACETAMINOPHEN 160 MG/5ML PO SOLN: 325.0000 mg | ORAL | Status: DC | PRN

## 2022-05-24 MED ORDER — LIDOCAINE-EPINEPHRINE 2 %-1:100000 IJ SOLN
INTRAMUSCULAR | Status: AC
  Filled 2022-05-24: qty 4

## 2022-05-24 MED ORDER — AMOXICILLIN 500 MG PO CAPS: 500.0000 mg | ORAL_CAPSULE | Freq: Three times a day (TID) | ORAL | 0 refills | Status: DC

## 2022-05-24 MED ORDER — PHENYLEPHRINE 80 MCG/ML (10ML) SYRINGE FOR IV PUSH (FOR BLOOD PRESSURE SUPPORT)
PREFILLED_SYRINGE | INTRAVENOUS | Status: AC
  Filled 2022-05-24: qty 10

## 2022-05-24 MED ORDER — OXYCODONE-ACETAMINOPHEN 10-325 MG PO TABS: 1.0000 | ORAL_TABLET | ORAL | 0 refills | Status: DC | PRN

## 2022-05-24 MED ORDER — ORAL CARE MOUTH RINSE: 15.0000 mL | Freq: Once | OROMUCOSAL | Status: AC

## 2022-05-24 MED ORDER — SUGAMMADEX SODIUM 200 MG/2ML IV SOLN
INTRAVENOUS | Status: DC | PRN
  Administered 2022-05-24: 100 mg via INTRAVENOUS
  Administered 2022-05-24: 50 mg via INTRAVENOUS

## 2022-05-24 MED ORDER — PROPOFOL 10 MG/ML IV BOLUS
INTRAVENOUS | Status: DC | PRN
  Administered 2022-05-24: 150 mg via INTRAVENOUS

## 2022-05-24 MED ORDER — ACETAMINOPHEN 325 MG PO TABS: 325.0000 mg | ORAL_TABLET | ORAL | Status: DC | PRN

## 2022-05-24 MED ORDER — CEFAZOLIN SODIUM-DEXTROSE 2-4 GM/100ML-% IV SOLN
2.0000 g | INTRAVENOUS | Status: AC
  Administered 2022-05-24: 2 g via INTRAVENOUS
  Filled 2022-05-24: qty 100

## 2022-05-24 MED ORDER — LIDOCAINE 2% (20 MG/ML) 5 ML SYRINGE
INTRAMUSCULAR | Status: DC | PRN
  Administered 2022-05-24: 100 mg via INTRAVENOUS

## 2022-05-24 MED ORDER — DEXAMETHASONE SODIUM PHOSPHATE 10 MG/ML IJ SOLN
INTRAMUSCULAR | Status: DC | PRN
  Administered 2022-05-24: 10 mg via INTRAVENOUS

## 2022-05-24 MED ORDER — OXYCODONE HCL 5 MG/5ML PO SOLN: 5.0000 mg | Freq: Once | ORAL | Status: DC | PRN

## 2022-05-24 MED ORDER — OXYMETAZOLINE HCL 0.05 % NA SOLN
NASAL | Status: AC
  Filled 2022-05-24: qty 120

## 2022-05-24 MED ORDER — OXYMETAZOLINE HCL 0.05 % NA SOLN
NASAL | Status: DC | PRN
  Administered 2022-05-24: 1 via TOPICAL

## 2022-05-24 MED ORDER — PROPOFOL 1000 MG/100ML IV EMUL
INTRAVENOUS | Status: AC
  Filled 2022-05-24: qty 100

## 2022-05-24 MED ORDER — LIDOCAINE 2% (20 MG/ML) 5 ML SYRINGE
INTRAMUSCULAR | Status: AC
  Filled 2022-05-24: qty 5

## 2022-05-24 SURGICAL SUPPLY — 36 items
BAG COUNTER SPONGE SURGICOUNT (BAG) IMPLANT
BLADE SURG 15 STRL LF DISP TIS (BLADE) ×1 IMPLANT
BLADE SURG 15 STRL SS (BLADE) ×1
BUR CROSS CUT FISSURE 1.6 (BURR) ×1 IMPLANT
BUR EGG ELITE 4.0 (BURR) ×1 IMPLANT
CANISTER SUCT 3000ML PPV (MISCELLANEOUS) ×1 IMPLANT
COVER SURGICAL LIGHT HANDLE (MISCELLANEOUS) ×1 IMPLANT
DRAPE U-SHAPE 76X120 STRL (DRAPES) ×1 IMPLANT
GAUZE PACKING FOLDED 2  STR (GAUZE/BANDAGES/DRESSINGS) ×1
GAUZE PACKING FOLDED 2 STR (GAUZE/BANDAGES/DRESSINGS) ×1 IMPLANT
GLOVE BIO SURGEON STRL SZ 6.5 (GLOVE) IMPLANT
GLOVE BIO SURGEON STRL SZ7 (GLOVE) IMPLANT
GLOVE BIO SURGEON STRL SZ8 (GLOVE) ×1 IMPLANT
GLOVE BIOGEL PI IND STRL 6.5 (GLOVE) IMPLANT
GLOVE BIOGEL PI IND STRL 7.0 (GLOVE) IMPLANT
GOWN STRL REUS W/ TWL LRG LVL3 (GOWN DISPOSABLE) ×1 IMPLANT
GOWN STRL REUS W/ TWL XL LVL3 (GOWN DISPOSABLE) ×1 IMPLANT
GOWN STRL REUS W/TWL LRG LVL3 (GOWN DISPOSABLE) ×1
GOWN STRL REUS W/TWL XL LVL3 (GOWN DISPOSABLE) ×1
IV NS 1000ML (IV SOLUTION) ×1
IV NS 1000ML BAXH (IV SOLUTION) ×1 IMPLANT
KIT BASIN OR (CUSTOM PROCEDURE TRAY) ×1 IMPLANT
KIT TURNOVER KIT B (KITS) ×1 IMPLANT
NDL HYPO 25GX1X1/2 BEV (NEEDLE) ×2 IMPLANT
NEEDLE HYPO 25GX1X1/2 BEV (NEEDLE) ×2 IMPLANT
NS IRRIG 1000ML POUR BTL (IV SOLUTION) ×1 IMPLANT
PAD ARMBOARD 7.5X6 YLW CONV (MISCELLANEOUS) ×1 IMPLANT
SLEEVE IRRIGATION ELITE 7 (MISCELLANEOUS) ×1 IMPLANT
SPIKE FLUID TRANSFER (MISCELLANEOUS) ×1 IMPLANT
SPONGE SURGIFOAM ABS GEL 12-7 (HEMOSTASIS) IMPLANT
SUT CHROMIC 3 0 PS 2 (SUTURE) ×1 IMPLANT
SYR BULB IRRIG 60ML STRL (SYRINGE) ×1 IMPLANT
SYR CONTROL 10ML LL (SYRINGE) ×1 IMPLANT
TRAY ENT MC OR (CUSTOM PROCEDURE TRAY) ×1 IMPLANT
TUBING IRRIGATION (MISCELLANEOUS) ×1 IMPLANT
YANKAUER SUCT BULB TIP NO VENT (SUCTIONS) ×1 IMPLANT

## 2022-05-24 NOTE — Op Note (Signed)
05/24/2022  8:19 AM  PATIENT:  [place]  51 y.o. female  PRE-OPERATIVE DIAGNOSIS:  non restorable teeth # 2, 4, 6, 7, 8, 9, 10, 11, 14, 15, 19, 20, 21, 22, 23, 24, 25, 26, 27, 28, 29, 30, Bilateral mandibuar lingual tori  POST-OPERATIVE DIAGNOSIS:  SAME  PROCEDURE:  Procedure(s): EXTRACTIONS teeth # 2, 4, 6, 7, 8, 9, 10, 11, 14, 15, 19, 20, 21, 22, 23, 24, 25, 26, 27, 28, 29, 30, Alveoloplasty Left maxilla, right and left mandible. Removal Bilateral mandibuar lingual tori  SURGEON:  Surgeon(s): Diona Browner, DMD  ANESTHESIA:   local and general  EBL:  minimal  DRAINS: none   SPECIMEN:  No Specimen  COUNTS:  YES  PLAN OF CARE: Discharge to home after PACU  PATIENT DISPOSITION:  PACU - hemodynamically stable.   PROCEDURE DETAILS: Dictation #  Gae Bon, DMD 05/24/2022 8:19 AM

## 2022-05-24 NOTE — H&P (Signed)
H&P documentation  -History and Physical Reviewed  -Patient has been re-examined  -No change in the plan of care  Sharon Welch  

## 2022-05-24 NOTE — Transfer of Care (Signed)
Immediate Anesthesia Transfer of Care Note  Patient: Sharon Welch  Procedure(s) Performed: DENTAL RESTORATION/EXTRACTIONS (Mouth)  Patient Location: PACU  Anesthesia Type:General  Level of Consciousness: awake, alert , and patient cooperative  Airway & Oxygen Therapy: Patient connected to face mask oxygen  Post-op Assessment: Report given to RN and Post -op Vital signs reviewed and stable  Post vital signs: Reviewed and stable  Last Vitals:  Vitals Value Taken Time  BP 142/85 05/24/22 0834  Temp    Pulse 89 05/24/22 0835  Resp 16 05/24/22 0835  SpO2 100 % 05/24/22 0835  Vitals shown include unvalidated device data.  Last Pain:  Vitals:   05/24/22 0601  TempSrc:   PainSc: 5       Patients Stated Pain Goal: 5 (79/48/01 6553)  Complications: There were no known notable events for this encounter.

## 2022-05-24 NOTE — Anesthesia Procedure Notes (Signed)
Procedure Name: Intubation Date/Time: 05/24/2022 7:25 AM  Performed by: Ester Rink, CRNAPre-anesthesia Checklist: Patient identified, Emergency Drugs available, Suction available and Patient being monitored Patient Re-evaluated:Patient Re-evaluated prior to induction Oxygen Delivery Method: Circle system utilized Preoxygenation: Pre-oxygenation with 100% oxygen Induction Type: IV induction Ventilation: Mask ventilation without difficulty Laryngoscope Size: Mac and 3 Grade View: Grade I Nasal Tubes: Nasal prep performed, Nasal Rae, Left and Magill forceps - small, utilized Tube size: 7.0 mm Number of attempts: 1 Placement Confirmation: ETT inserted through vocal cords under direct vision, positive ETCO2 and breath sounds checked- equal and bilateral Secured at: 21 cm Tube secured with: Tape Dental Injury: Teeth and Oropharynx as per pre-operative assessment

## 2022-05-24 NOTE — Anesthesia Postprocedure Evaluation (Signed)
Anesthesia Post Note  Patient: Sharon Welch  Procedure(s) Performed: DENTAL RESTORATION/EXTRACTIONS (Mouth)     Patient location during evaluation: PACU Anesthesia Type: General Level of consciousness: awake Pain management: pain level controlled Vital Signs Assessment: post-procedure vital signs reviewed and stable Respiratory status: spontaneous breathing Cardiovascular status: stable Postop Assessment: no apparent nausea or vomiting Anesthetic complications: no  There were no known notable events for this encounter.  Last Vitals:  Vitals:   05/24/22 0900 05/24/22 0915  BP: (!) 141/82 (!) 150/80  Pulse: 87 84  Resp: 15 14  Temp:  (!) 36.2 C  SpO2: 97% 96%    Last Pain:  Vitals:   05/24/22 0835  TempSrc:   PainSc: 0-No pain                 Huston Foley

## 2022-05-24 NOTE — Interval H&P Note (Signed)
Anesthesia H&P Update: History and Physical Exam reviewed; patient is OK for planned anesthetic and procedure. ? ?

## 2022-05-24 NOTE — Op Note (Unsigned)
NAME: Doughman, [place] RECORD NO: [id number] ACCOUNT NO: [id number] DATE OF BIRTH: 1971-02-06 FACILITY: [facility] LOCATION: [facility] PHYSICIAN: Gae Bon, DDS  Operative Report   DATE OF PROCEDURE: 05/24/2022  PREOPERATIVE DIAGNOSES:  Nonrestorable teeth secondary to dental caries numbers 2, 4, 6, 7, 8, 9, 10, 11, 14, 15, 19, 20, 21, 22, 23, 24, 25, 26, 27, 28, 29, 30; bilateral mandibular lingual tori.  POSTOPERATIVE DIAGNOSES:  Nonrestorable teeth secondary to dental caries numbers 2, 4, 6, 7, 8, 9, 10, 11, 14, 15, 19, 20, 21, 22, 23, 24, 25, 26, 27, 28, 29, 30; bilateral mandibular lingual tori.  PROCEDURE:  Extraction teeth numbers 2, 4, 6, 7, 8, 9, 10, 11, 14, 15, 19, 20, 21, 22, 23, 24, 25, 26, 27, 28, 29, 30.  Alveoplasty left maxilla, right and left mandible; removal bilateral mandibular lingual tori.  SURGEON:  Gae Bon, DDS  ANESTHESIA:  General, nasal intubation, Dr. Jillyn Hidden, attending.  DESCRIPTION OF PROCEDURE:  The patient was taken to the operating room and placed on table in supine position.  General anesthesia was administered and nasal endotracheal tube was placed and secured.  The eyes were protected and the patient was draped  for surgery.  Timeout was performed.  The posterior pharynx was suctioned and a throat pack was placed.  2% lidocaine in 1:100,000 epinephrine was infiltrated in an inferior alveolar block on the right and left sides with buccal infiltration in the  anterior mandible and then in the maxilla. Anesthesia was administered buccally and palatally around the teeth to be removed.  A bite block was placed on the right side of the mouth.  A sweetheart retractor was used to retract the tongue.  A #15 blade  was used to make an incision beginning at tooth #19, carried forward both buccally and lingually and the gingival sulcus until tooth #27 was encountered.  Periosteum was reflected.  The teeth were elevated and removed in simple fashion with  dental  forceps.  The sockets were curetted.  The periosteum was reflected to expose the alveolar crest, which had multiple irregular sharp contour areas and undercuts, so alveoplasty was performed using the egg bur and then the bone file. Then the lingual  tissue was reflected to expose the lingual torus.  The Seldin retractor was used to retract the lingual tissues and then the torus was removed using the Stryker handpiece to recontour the bone.  Then, the left mandible was irrigated and suctioned and  sutured with 3-0 chromic.  The left maxilla was operated next.  A 15 blade was used to make an incision around teeth numbers 15, 14, 11, 10, 9, 8 and 7.  The periosteum was reflected.  The teeth were elevated and removed with the dental forceps in  routine fashion.  Sockets were curetted.  The periosteum was reflected to expose the alveolar crest, which was irregular in contour and had undercuts.  Alveoplasty was performed using the egg bur followed by the bone file.  Then, the area was irrigated  and closed with 3-0 chromic.  The bite block was repositioned to the other side of the mouth.  A 15 blade was used to make an incision around teeth numbers 27, 28, 29, 30 and around teeth numbers 2, 4, and 6.  The periosteum was reflected.  The teeth  were elevated and removed with the dental forceps. The mandibular flap was reflected buccally and lingually to expose the alveolar crest, which was irregular in contour as  well as the lingual torus.  The alveoplasty was performed using the egg bur and  then the torus was removed as well in the mandible using the egg bur.  Then, the area was irrigated and closed with 3-0 chromic.  In the maxilla, the teeth were removed with the dental forceps and sockets were curetted. Alveoplasty was not needed in this  area.  The area was closed with 3-0 chromic.  The oral cavity was then irrigated and suctioned.  The throat pack was removed.  The patient was left under care of  anesthesia for transport to recovery with plans for discharge home through day surgery.  ESTIMATED BLOOD LOSS:  Minimum.  COMPLICATIONS:  None.  SPECIMENS: None.  COUNTS:  Correct.      PAA D: 05/24/2022 8:24:35 am T: 05/24/2022 9:07:00 am  JOB: [id number]/ [id number]

## 2022-05-25 ENCOUNTER — Encounter (HOSPITAL_COMMUNITY): Payer: Self-pay | Admitting: Oral Surgery

## 2022-07-18 ENCOUNTER — Ambulatory Visit (INDEPENDENT_AMBULATORY_CARE_PROVIDER_SITE_OTHER): Payer: 59 | Admitting: Neurology

## 2022-07-18 ENCOUNTER — Inpatient Hospital Stay (HOSPITAL_COMMUNITY)
Admission: EM | Admit: 2022-07-18 | Discharge: 2022-07-20 | DRG: 315 | Disposition: A | Discharge status: Home / Self Care | Payer: 59 | Attending: Internal Medicine | Admitting: Internal Medicine

## 2022-07-18 ENCOUNTER — Other Ambulatory Visit: Payer: Self-pay

## 2022-07-18 ENCOUNTER — Encounter (HOSPITAL_COMMUNITY): Payer: Self-pay | Admitting: Emergency Medicine

## 2022-07-18 ENCOUNTER — Emergency Department (HOSPITAL_COMMUNITY): Payer: 59

## 2022-07-18 ENCOUNTER — Telehealth: Payer: Self-pay | Admitting: Neurology

## 2022-07-18 ENCOUNTER — Encounter: Payer: Self-pay | Admitting: Neurology

## 2022-07-18 VITALS — BP 115/81 | HR 118 | Ht 60.0 in | Wt 88.3 lb

## 2022-07-18 DIAGNOSIS — Z885 Allergy status to narcotic agent status: Secondary | ICD-10-CM

## 2022-07-18 DIAGNOSIS — Z681 Body mass index (BMI) 19 or less, adult: Secondary | ICD-10-CM

## 2022-07-18 DIAGNOSIS — I950 Idiopathic hypotension: Secondary | ICD-10-CM | POA: Diagnosis not present

## 2022-07-18 DIAGNOSIS — M797 Fibromyalgia: Secondary | ICD-10-CM | POA: Diagnosis present

## 2022-07-18 DIAGNOSIS — Q283 Other malformations of cerebral vessels: Secondary | ICD-10-CM

## 2022-07-18 DIAGNOSIS — Z8541 Personal history of malignant neoplasm of cervix uteri: Secondary | ICD-10-CM

## 2022-07-18 DIAGNOSIS — K279 Peptic ulcer, site unspecified, unspecified as acute or chronic, without hemorrhage or perforation: Secondary | ICD-10-CM | POA: Diagnosis present

## 2022-07-18 DIAGNOSIS — E876 Hypokalemia: Secondary | ICD-10-CM | POA: Diagnosis present

## 2022-07-18 DIAGNOSIS — G40209 Localization-related (focal) (partial) symptomatic epilepsy and epileptic syndromes with complex partial seizures, not intractable, without status epilepticus: Secondary | ICD-10-CM | POA: Diagnosis present

## 2022-07-18 DIAGNOSIS — G8929 Other chronic pain: Secondary | ICD-10-CM | POA: Diagnosis present

## 2022-07-18 DIAGNOSIS — G40909 Epilepsy, unspecified, not intractable, without status epilepticus: Secondary | ICD-10-CM

## 2022-07-18 DIAGNOSIS — Z886 Allergy status to analgesic agent status: Secondary | ICD-10-CM

## 2022-07-18 DIAGNOSIS — M5137 Other intervertebral disc degeneration, lumbosacral region: Secondary | ICD-10-CM | POA: Diagnosis present

## 2022-07-18 DIAGNOSIS — F17210 Nicotine dependence, cigarettes, uncomplicated: Secondary | ICD-10-CM | POA: Diagnosis present

## 2022-07-18 DIAGNOSIS — G43709 Chronic migraine without aura, not intractable, without status migrainosus: Secondary | ICD-10-CM | POA: Diagnosis present

## 2022-07-18 DIAGNOSIS — Z8711 Personal history of peptic ulcer disease: Secondary | ICD-10-CM

## 2022-07-18 DIAGNOSIS — Z90710 Acquired absence of both cervix and uterus: Secondary | ICD-10-CM

## 2022-07-18 DIAGNOSIS — I959 Hypotension, unspecified: Secondary | ICD-10-CM | POA: Diagnosis present

## 2022-07-18 DIAGNOSIS — Z79899 Other long term (current) drug therapy: Secondary | ICD-10-CM

## 2022-07-18 DIAGNOSIS — M5136 Other intervertebral disc degeneration, lumbar region: Secondary | ICD-10-CM | POA: Diagnosis present

## 2022-07-18 DIAGNOSIS — Z8249 Family history of ischemic heart disease and other diseases of the circulatory system: Secondary | ICD-10-CM

## 2022-07-18 DIAGNOSIS — R636 Underweight: Secondary | ICD-10-CM | POA: Insufficient documentation

## 2022-07-18 DIAGNOSIS — Z9102 Food additives allergy status: Secondary | ICD-10-CM

## 2022-07-18 DIAGNOSIS — R4781 Slurred speech: Secondary | ICD-10-CM | POA: Insufficient documentation

## 2022-07-18 DIAGNOSIS — M549 Dorsalgia, unspecified: Secondary | ICD-10-CM | POA: Diagnosis present

## 2022-07-18 DIAGNOSIS — Z91018 Allergy to other foods: Secondary | ICD-10-CM

## 2022-07-18 LAB — CBC WITH DIFFERENTIAL/PLATELET
Abs Immature Granulocytes: 0.04 10*3/uL (ref 0.00–0.07)
Basophils Absolute: 0.1 10*3/uL (ref 0.0–0.1)
Basophils Relative: 1 %
Eosinophils Absolute: 0 10*3/uL (ref 0.0–0.5)
Eosinophils Relative: 1 %
HCT: 37.8 % (ref 36.0–46.0)
Hemoglobin: 12.9 g/dL (ref 12.0–15.0)
Immature Granulocytes: 1 %
Lymphocytes Relative: 27 %
Lymphs Abs: 2.3 10*3/uL (ref 0.7–4.0)
MCH: 31 pg (ref 26.0–34.0)
MCHC: 34.1 g/dL (ref 30.0–36.0)
MCV: 90.9 fL (ref 80.0–100.0)
Monocytes Absolute: 0.8 10*3/uL (ref 0.1–1.0)
Monocytes Relative: 10 %
Neutro Abs: 5.1 10*3/uL (ref 1.7–7.7)
Neutrophils Relative %: 60 %
Platelets: 255 10*3/uL (ref 150–400)
RBC: 4.16 MIL/uL (ref 3.87–5.11)
RDW: 12.2 % (ref 11.5–15.5)
WBC: 8.4 10*3/uL (ref 4.0–10.5)
nRBC: 0 % (ref 0.0–0.2)

## 2022-07-18 LAB — PROTIME-INR
INR: 1.1 (ref 0.8–1.2)
Prothrombin Time: 13.6 seconds (ref 11.4–15.2)

## 2022-07-18 LAB — LACTIC ACID, PLASMA: Lactic Acid, Venous: 1.6 mmol/L (ref 0.5–1.9)

## 2022-07-18 MED ORDER — DIVALPROEX SODIUM ER 500 MG PO TB24: 1000.0000 mg | ORAL_TABLET | Freq: Every evening | ORAL | 11 refills | Status: DC

## 2022-07-18 MED ORDER — SODIUM CHLORIDE 0.9 % IV BOLUS
1000.0000 mL | Freq: Once | INTRAVENOUS | Status: AC
  Administered 2022-07-18: 1000 mL via INTRAVENOUS

## 2022-07-18 NOTE — Telephone Encounter (Signed)
[employer] medicare/[employer] medicaid NPR sent to [facility] [phone] 

## 2022-07-18 NOTE — Progress Notes (Signed)
Chief Complaint  Patient presents with   New Patient (Initial Visit)    Rm 12 with daughter. Here for TOC from Dr. Merlene Laughter. Hx of epilepsy, migraines , TIA. Pt reports last night she had a spell where she felt dizzy and had episode where she does not remember her timeline.      ASSESSMENT AND PLAN  Sharon Welch is a 52 y.o. 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 Chronic migraine headaches  Recurrent partial seizure event, most recent one likely on July 17, 2022,  Repeat MRI of the brain with without contrast  EEG  Laboratory evaluation,  I do not think Topamax is the best choice for her, she already had low body weight, still has frequent recurrent spells taking Topamax 100 mg twice daily, will change to Depakote ER 500 mg 2 tablets daily, if she could not tolerate, other options on lamotrigine, Trileptal,  Return To Clinic With NP In 6 Months    DIAGNOSTIC DATA (LABS, IMAGING, TESTING) - I reviewed patient records, labs, notes, testing and imaging myself where available. Personally reviewed MRI of the brain with and without contrast from 2012, left temporoparietal region chronic hemorrhage, likely related to chronic hemorrhage from cavernoma  MRI of the brain showed no significant abnormality  Laboratory evaluation in December 2023, CBC showed normal hemoglobin of 13.7, mild elevation of WBC 11, normal BMP  MEDICAL HISTORY:  Sharon Welch, is a 52 year old female, accompanied by her daughter seen in request by her primary care from [place], for evaluation of seizure, initial evaluation was on July 18, 2022  I reviewed and summarized the referring note. PMHX Chronic migraine,  GERD, history of gastric ulcer. Chronic insomnia Chronic constipation Fibromyogia Chronic migraine Cervical Cancer, s/p hysterectomy Smoke   She was under the care of by Dr. Merlene Laughter, who has closed his  practice, she was given the diagnosis of seizure, chronic migraine headaches  Patient reported history of migraine since 52 years old, lateralized severe pounding headache with light noise sensitivity  Following her gastric ulcer procedure in 2010, she also developed seizure, sometimes has aura of strange smell, followed by loss of consciousness, but sometimes she went into generalized tonic-clonic seizure  Last generalized seizure without warning signs was in December 2023, at nighttime she was sitting watching TV, woke up had a significant left side retro-orbital area headache, more intense than her usual migraine headaches, also has brain fog, confusion, lasting for few hours  Yesterday July 17, 2022, she also had an episode of sudden onset vertigo, confusion, time laps,  Daughter also reported she has zoning out spells, couple times each months, occasionally with finger fidgety, blinking  Personally reviewed MRI of the brain in 2012, evidence of left parietotemporal region cavernoma, hemorrhage, no mass effect,  She has been treated with Topamax 100 mg twice a day, worried about her weight loss, continue to have migraine headache once or twice each week,  Has been on disability for many years due to history of scoliosis, Harrington rod placement, quit driving around 8338,  PHYSICAL EXAM:   Vitals:   07/18/22 1413  BP: 115/81  Pulse: (!) 118  Weight: 88 lb 5 oz (40.1 kg)  Height: 5' (1.524 m)   Not recorded     Body mass index is 17.25 kg/m.  PHYSICAL EXAMNIATION:  Gen: NAD, conversant, well nourised, well groomed  Cardiovascular: Regular rate rhythm, no peripheral edema, warm, nontender. Eyes: Conjunctivae clear without exudates or hemorrhage Neck: Supple, no carotid bruits. Pulmonary: Clear to auscultation bilaterally   NEUROLOGICAL EXAM:  MENTAL STATUS: Speech/cognition: Thin fragile lady, awake, alert, oriented to history taking and casual  conversation CRANIAL NERVES: CN II: Visual fields are full to confrontation. Pupils are round equal and briskly reactive to light. CN III, IV, VI: extraocular movement are normal. No ptosis. CN V: Facial sensation is intact to light touch CN VII: Face is symmetric with normal eye closure  CN VIII: Hearing is normal to causal conversation. CN IX, X: Phonation is normal. CN XI: Head turning and shoulder shrug are intact  MOTOR: There is no pronator drift of out-stretched arms. Muscle bulk and tone are normal. Muscle strength is normal.  REFLEXES: Reflexes are 2+ and symmetric at the biceps, triceps, knees, and ankles. Plantar responses are flexor.  SENSORY: Intact to light touch, pinprick and vibratory sensation are intact in fingers and toes.  COORDINATION: There is no trunk or limb dysmetria noted.  GAIT/STANCE: Able to get up from seated position arm crossed, cautious gait  REVIEW OF SYSTEMS:  Full 14 system review of systems performed and notable only for as above All other review of systems were negative.   ALLERGIES: Allergies  Allergen Reactions   Coconut Flavor Swelling   Tramadol Other (See Comments)    Possible seizures   Aleve [Naproxen] Other (See Comments)    Ulcers   Pineapple Other (See Comments)    Migraine    HOME MEDICATIONS: Current Outpatient Medications  Medication Sig Dispense Refill   albuterol (VENTOLIN HFA) 108 (90 Base) MCG/ACT inhaler Inhale 1-2 puffs into the lungs every 4 (four) hours as needed for wheezing or shortness of breath.     amitriptyline (ELAVIL) 25 MG tablet Take 50 mg by mouth at bedtime.     dexlansoprazole (DEXILANT) 60 MG capsule Take 60 mg by mouth daily.     diclofenac Sodium (VOLTAREN) 1 % GEL Apply 2 g topically daily as needed (Pain).     folic acid (FOLVITE) 1 MG tablet Take 1 mg by mouth daily.     hydrOXYzine (ATARAX) 50 MG tablet Take 25 mg by mouth at bedtime.     linaclotide (LINZESS) 145 MCG CAPS capsule Take 145  mcg by mouth daily before breakfast.     Milnacipran (SAVELLA) 50 MG TABS tablet Take 50 mg by mouth 2 (two) times daily.     naloxone (NARCAN) nasal spray 4 mg/0.1 mL Place 1 spray into the nose once.     promethazine (PHENERGAN) 25 MG tablet Take 25 mg by mouth every 4 (four) hours as needed for nausea or vomiting.      rizatriptan (MAXALT) 10 MG tablet Take 10 mg by mouth as needed for migraine. May repeat in 2 hours if needed     tiZANidine (ZANAFLEX) 2 MG tablet Take 2 mg by mouth 2 (two) times daily.     topiramate (TOPAMAX) 100 MG tablet Take 100 mg by mouth 2 (two) times daily.     No current facility-administered medications for this visit.    PAST MEDICAL HISTORY: Past Medical History:  Diagnosis Date   Bruising, spontaneous 12/08/2013   Cancer ([place])    cervical cancer - had hysterectomy   Chronic back pain    Chronic back pain    DDD (degenerative disc disease), lumbar    DDD (degenerative disc disease), lumbar    Fibromyalgia  Headache(784.0)    migraines   Ovarian cyst    Partial tear subscapularis tendon 12/27/2011   Scoliosis    Seizures ([place])    started 9/12-    PAST SURGICAL HISTORY: Past Surgical History:  Procedure Laterality Date   ABDOMINAL HYSTERECTOMY  2012   BACK SURGERY  1989   scoliosis throsic-rods   CESAREAN SECTION     CHOLECYSTECTOMY N/A 11/17/2012   Procedure: LAPAROSCOPIC CHOLECYSTECTOMY WITH INTRAOPERATIVE CHOLANGIOGRAM;  Surgeon: Ralene Ok, MD;  Location: [place];  Service: General;  Laterality: N/A;   COLONOSCOPY WITH PROPOFOL N/A 04/21/2020   Procedure: COLONOSCOPY WITH PROPOFOL;  Surgeon: Carol Ada, MD;  Location: [facility];  Service: Endoscopy;  Laterality: N/A;   ENDOSCOPIC RETROGRADE CHOLANGIOPANCREATOGRAPHY (ERCP) WITH PROPOFOL N/A 06/11/2019   Procedure: ENDOSCOPIC RETROGRADE CHOLANGIOPANCREATOGRAPHY (ERCP) WITH PROPOFOL;  Surgeon: Carol Ada, MD;  Location: [facility];  Service: Endoscopy;  Laterality: N/A;   ERCP  N/A 05/07/2013   Procedure: ENDOSCOPIC RETROGRADE CHOLANGIOPANCREATOGRAPHY (ERCP);  Surgeon: Beryle Beams, MD;  Location: [facility];  Service: Endoscopy;  Laterality: N/A;  start ercp first, if canulation fails switch to EUS    ESOPHAGOGASTRODUODENOSCOPY (EGD) WITH PROPOFOL N/A 05/15/2019   Procedure: ESOPHAGOGASTRODUODENOSCOPY (EGD) WITH PROPOFOL;  Surgeon: Danie Binder, MD;  [facility];  Service: Endoscopy;  Laterality: N/A;   ESOPHAGOGASTRODUODENOSCOPY (EGD) WITH PROPOFOL N/A 03/03/2020   Procedure: ESOPHAGOGASTRODUODENOSCOPY (EGD) WITH PROPOFOL;  Surgeon: Carol Ada, MD;  Location: [facility];  Service: Endoscopy;  Laterality: N/A;   ESOPHAGOGASTRODUODENOSCOPY (EGD) WITH PROPOFOL N/A 04/21/2020   Procedure: ESOPHAGOGASTRODUODENOSCOPY (EGD) WITH PROPOFOL;  Surgeon: Carol Ada, MD;  Location: [facility];  Service: Endoscopy;  Laterality: N/A;   EUS N/A 11/03/2012   Procedure: UPPER ENDOSCOPIC ULTRASOUND (EUS) LINEAR;  Surgeon: Beryle Beams, MD;  Location: [facility];  Service: Endoscopy;  Laterality: N/A;   left arm     NECK SURGERY     POLYPECTOMY  04/21/2020   Procedure: POLYPECTOMY;  Surgeon: Carol Ada, MD;  Location: [facility];  Service: Endoscopy;;   REMOVAL OF STONES  06/11/2019   Procedure: REMOVAL OF STONES;  Surgeon: Carol Ada, MD;  Location: [facility];  Service: Endoscopy;;   TOOTH EXTRACTION N/A 05/24/2022   Procedure: DENTAL RESTORATION/EXTRACTIONS;  Surgeon: Diona Browner, DMD;  Location: [place] [facility];  Service: Oral Surgery;  Laterality: N/A;   TUBAL LIGATION     UPPER ESOPHAGEAL ENDOSCOPIC ULTRASOUND (EUS) N/A 06/11/2019   Procedure: UPPER ESOPHAGEAL ENDOSCOPIC ULTRASOUND (EUS);  Surgeon: Carol Ada, MD;  Location: [facility];  Service: Endoscopy;  Laterality: N/A;    FAMILY HISTORY: Family History  Problem Relation Age of Onset   Hypertension Mother    Hypertension Father    Cancer Father        prostate   Heart disease  Father    Hypertension Brother    Hypertension Brother     SOCIAL HISTORY: Social History   Socioeconomic History   Marital status: Married    Spouse name: Not on file   Number of children: Not on file   Years of education: Not on file   Highest education level: Not on file  Occupational History   Not on file  Tobacco Use   Smoking status: Every Day    Packs/day: 0.50    Years: 20.00    Total pack years: 10.00    Types: Cigarettes   Smokeless tobacco: Never  Vaping Use   Vaping Use: Never used  Substance and Sexual Activity   Alcohol  use: Not Currently   Drug use: No   Sexual activity: Not on file  Other Topics Concern   Not on file  Social History Narrative   Not on file   Social Determinants of Health   Financial Resource Strain: Not on file  Food Insecurity: Not on file  Transportation Needs: Not on file  Physical Activity: Not on file  Stress: Not on file  Social Connections: Not on file  Intimate Partner Violence: Not on file      Marcial Pacas, M.D. Ph.D.  [facility] [address] [place], [place] [address] Ph: [phone] Fax: [phone]  CC:  Phillips Odor, MD [address] [place],  [place] [address]  [employer], MD

## 2022-07-18 NOTE — Patient Instructions (Signed)
Meds ordered this encounter  Medications   divalproex (DEPAKOTE ER) 500 MG 24 hr tablet    Sig: Take 2 tablets (1,000 mg total) by mouth at bedtime.    Dispense:  60 tablet    Refill:  11      Start Depakote and then stop Topamax.

## 2022-07-18 NOTE — ED Provider Notes (Signed)
[facility] Provider Note   CSN: [id number] Arrival date & time: 07/18/22  2303     History  Chief Complaint  Patient presents with   Neurologic Problem    Sharon Welch is a 52 y.o. female.  Patient is a 52 year old female presents to the ER for evaluation of dizziness, headache, slurred speech, confusion.  Symptoms began approximately 30 minutes prior to presentation.  Patient seen earlier today in the neurology clinic and was started on Depakote.  She has had 1 dose of this medication.  She denies to me she is experiencing any nausea, vomiting, or diarrhea.  She denies any fevers or chills.  She arrives here with hypotension.  The history is provided by the patient.       Home Medications Prior to Admission medications   Medication Sig Start Date End Date Taking? Authorizing Provider  albuterol (VENTOLIN HFA) 108 (90 Base) MCG/ACT inhaler Inhale 1-2 puffs into the lungs every 4 (four) hours as needed for wheezing or shortness of breath.    [provider]  amitriptyline (ELAVIL) 25 MG tablet Take 50 mg by mouth at bedtime.    [provider]  dexlansoprazole (DEXILANT) 60 MG capsule Take 60 mg by mouth daily.    [provider]  diclofenac Sodium (VOLTAREN) 1 % GEL Apply 2 g topically daily as needed (Pain). 02/12/22   [provider]  divalproex (DEPAKOTE ER) 500 MG 24 hr tablet Take 2 tablets (1,000 mg total) by mouth at bedtime. 07/18/22   Marcial Pacas, MD  folic acid (FOLVITE) 1 MG tablet Take 1 mg by mouth daily. 04/15/22   [provider]  hydrOXYzine (ATARAX) 50 MG tablet Take 25 mg by mouth at bedtime. 01/28/22   [provider]  linaclotide (LINZESS) 145 MCG CAPS capsule Take 145 mcg by mouth daily before breakfast.    [provider]  Milnacipran (SAVELLA) 50 MG TABS tablet Take 50 mg by mouth 2 (two) times daily.    [provider]  naloxone [facility]) nasal spray  4 mg/0.1 mL Place 1 spray into the nose once. 01/16/22   [provider]  promethazine (PHENERGAN) 25 MG tablet Take 25 mg by mouth every 4 (four) hours as needed for nausea or vomiting.     [provider]  rizatriptan (MAXALT) 10 MG tablet Take 10 mg by mouth as needed for migraine. May repeat in 2 hours if needed    [provider]  tiZANidine (ZANAFLEX) 2 MG tablet Take 2 mg by mouth 2 (two) times daily.    [provider]  topiramate (TOPAMAX) 100 MG tablet Take 100 mg by mouth 2 (two) times daily.    [provider]      Allergies    Coconut flavor, Tramadol, Aleve [naproxen], and Pineapple    Review of Systems   Review of Systems  All other systems reviewed and are negative.   Physical Exam Updated Vital Signs BP (!) 64/48   Pulse 69   Temp 98 F (36.7 C) (Oral)   Resp 17   Ht 5' (1.524 m)   Wt 39.9 kg   SpO2 100%   BMI 17.19 kg/m  Physical Exam Vitals and nursing note reviewed.  Constitutional:      General: She is not in acute distress.    Appearance: She is well-developed. She is not diaphoretic.     Comments: Patient is somewhat pale appearing, but in no acute  distress.  She is awake, alert, and mentating appropriately.  HENT:     Head: Normocephalic and atraumatic.     Mouth/Throat:     Mouth: Mucous membranes are dry.     Pharynx: No oropharyngeal exudate or posterior oropharyngeal erythema.  Eyes:     Extraocular Movements: Extraocular movements intact.     Pupils: Pupils are equal, round, and reactive to light.  Cardiovascular:     Rate and Rhythm: Normal rate and regular rhythm.     Heart sounds: No murmur heard.    No friction rub. No gallop.  Pulmonary:     Effort: Pulmonary effort is normal. No respiratory distress.     Breath sounds: Normal breath sounds. No wheezing.  Abdominal:     General: Bowel sounds are normal. There is no distension.     Palpations: Abdomen is soft.     Tenderness: There is no  abdominal tenderness.  Musculoskeletal:        General: Normal range of motion.     Cervical back: Normal range of motion and neck supple. No rigidity or tenderness.  Skin:    General: Skin is warm and dry.  Neurological:     General: No focal deficit present.     Mental Status: She is alert and oriented to person, place, and time.     Cranial Nerves: No cranial nerve deficit.     ED Results / Procedures / Treatments   Labs (all labs ordered are listed, but only abnormal results are displayed) Labs Reviewed  CULTURE, BLOOD (ROUTINE X 2)  CULTURE, BLOOD (ROUTINE X 2)  URINE CULTURE  CBC WITH DIFFERENTIAL/PLATELET  PROTIME-INR  LIPASE, BLOOD  COMPREHENSIVE METABOLIC PANEL  LACTIC ACID, PLASMA  LACTIC ACID, PLASMA  URINALYSIS, ROUTINE W REFLEX MICROSCOPIC  TROPONIN I (HIGH SENSITIVITY)    EKG EKG Interpretation  Date/Time:  Thursday July 18 2022 23:19:45 EST Ventricular Rate:  75 PR Interval:  153 QRS Duration: 103 QT Interval:  421 QTC Calculation: 471 R Axis:   -34 Text Interpretation: Sinus rhythm Left axis deviation RSR' in V1 or V2, right VCD or RVH No significant change since 01/29/2021 Confirmed by Veryl Speak [phone]) on 07/18/2022 11:35:31 PM  Radiology No results found.  Procedures Procedures    Medications Ordered in ED Medications  sodium chloride 0.9 % bolus 1,000 mL (1,000 mLs Intravenous New Bag/Given 07/18/22 2332)    ED Course/ Medical Decision Making/ A&P  Patient is a 52 year old female presenting with complaints of headache, dizziness, confusion, and slurred speech.  This came on acutely this evening.  Her daughter was concerned about the possibility of stroke and called 911.  Patient arrives here markedly hypotensive, but vital signs are otherwise unremarkable.  Physical examination reveals no obvious findings.  Workup initiated including CBC, metabolic panel, lactate, and blood cultures.  The studies have all returned and are unremarkable.   Lactate is normal.  Troponin x 2 is negative.  Chest x-ray shows no acute process and CT scan of the head shows no evidence for an acute neurologic event.  Patient was hydrated with 2 L of normal saline, but blood pressures remained low.  Levophed was then initiated at 4 mcg/min with good improvement in her blood pressure.  This was then titrated back to 2 mcg/min and blood pressure holding fine.  I am uncertain as to the cause of the patient's hypotension, however she does not appear septic.  She has no fever, no white count, normal lactate, and no  symptoms that would indicate an infectious process.  The only difference in her situation is her starting Depakote.  She took 1 dose of this this evening prior to this episode.  Hypotension could possibly be related to the Depakote or a medication reaction.  Either way, patient will require admission.  I spoke with Dr. Josephine Cables who agrees to admit.  CRITICAL CARE Performed by: Veryl Speak Total critical care time: 40 minutes Critical care time was exclusive of separately billable procedures and treating other patients. Critical care was necessary to treat or prevent imminent or life-threatening deterioration. Critical care was time spent personally by me on the following activities: development of treatment plan with patient and/or surrogate as well as nursing, discussions with consultants, evaluation of patient's response to treatment, examination of patient, obtaining history from patient or surrogate, ordering and performing treatments and interventions, ordering and review of laboratory studies, ordering and review of radiographic studies, pulse oximetry and re-evaluation of patient's condition.   Final Clinical Impression(s) / ED Diagnoses Final diagnoses:  None    Rx / DC Orders ED Discharge Orders     None         Veryl Speak, MD 07/19/22 [phone]

## 2022-07-18 NOTE — ED Triage Notes (Signed)
Pt to the ed with c/o stroke like symptoms that started 30 minutes. C/o dizziness, headache, slurred speech, confusion. Pt states they had a seizure last night. Pt is hypotensive. EDP at bedside

## 2022-07-19 ENCOUNTER — Observation Stay (HOSPITAL_COMMUNITY): Payer: 59

## 2022-07-19 ENCOUNTER — Inpatient Hospital Stay (HOSPITAL_COMMUNITY)
Admit: 2022-07-19 | Discharge: 2022-07-19 | Disposition: A | Discharge status: Home / Self Care | Payer: 59 | Attending: Internal Medicine | Admitting: Internal Medicine

## 2022-07-19 DIAGNOSIS — Z91018 Allergy to other foods: Secondary | ICD-10-CM | POA: Diagnosis not present

## 2022-07-19 DIAGNOSIS — Z8249 Family history of ischemic heart disease and other diseases of the circulatory system: Secondary | ICD-10-CM | POA: Diagnosis not present

## 2022-07-19 DIAGNOSIS — E876 Hypokalemia: Secondary | ICD-10-CM

## 2022-07-19 DIAGNOSIS — R42 Dizziness and giddiness: Secondary | ICD-10-CM

## 2022-07-19 DIAGNOSIS — I950 Idiopathic hypotension: Secondary | ICD-10-CM | POA: Diagnosis present

## 2022-07-19 DIAGNOSIS — M5136 Other intervertebral disc degeneration, lumbar region: Secondary | ICD-10-CM | POA: Diagnosis present

## 2022-07-19 DIAGNOSIS — Z79899 Other long term (current) drug therapy: Secondary | ICD-10-CM | POA: Diagnosis not present

## 2022-07-19 DIAGNOSIS — G43701 Chronic migraine without aura, not intractable, with status migrainosus: Secondary | ICD-10-CM

## 2022-07-19 DIAGNOSIS — Z885 Allergy status to narcotic agent status: Secondary | ICD-10-CM | POA: Diagnosis not present

## 2022-07-19 DIAGNOSIS — G40209 Localization-related (focal) (partial) symptomatic epilepsy and epileptic syndromes with complex partial seizures, not intractable, without status epilepticus: Secondary | ICD-10-CM | POA: Diagnosis present

## 2022-07-19 DIAGNOSIS — R636 Underweight: Secondary | ICD-10-CM | POA: Diagnosis present

## 2022-07-19 DIAGNOSIS — R569 Unspecified convulsions: Secondary | ICD-10-CM

## 2022-07-19 DIAGNOSIS — Z8711 Personal history of peptic ulcer disease: Secondary | ICD-10-CM

## 2022-07-19 DIAGNOSIS — Z90710 Acquired absence of both cervix and uterus: Secondary | ICD-10-CM | POA: Diagnosis not present

## 2022-07-19 DIAGNOSIS — Z886 Allergy status to analgesic agent status: Secondary | ICD-10-CM | POA: Diagnosis not present

## 2022-07-19 DIAGNOSIS — M797 Fibromyalgia: Secondary | ICD-10-CM | POA: Diagnosis present

## 2022-07-19 DIAGNOSIS — R4781 Slurred speech: Secondary | ICD-10-CM

## 2022-07-19 DIAGNOSIS — Z681 Body mass index (BMI) 19 or less, adult: Secondary | ICD-10-CM | POA: Diagnosis not present

## 2022-07-19 DIAGNOSIS — M549 Dorsalgia, unspecified: Secondary | ICD-10-CM | POA: Diagnosis present

## 2022-07-19 DIAGNOSIS — F17210 Nicotine dependence, cigarettes, uncomplicated: Secondary | ICD-10-CM | POA: Diagnosis present

## 2022-07-19 DIAGNOSIS — Z9102 Food additives allergy status: Secondary | ICD-10-CM | POA: Diagnosis not present

## 2022-07-19 DIAGNOSIS — M5137 Other intervertebral disc degeneration, lumbosacral region: Secondary | ICD-10-CM | POA: Diagnosis present

## 2022-07-19 DIAGNOSIS — G8929 Other chronic pain: Secondary | ICD-10-CM | POA: Diagnosis present

## 2022-07-19 DIAGNOSIS — Z8541 Personal history of malignant neoplasm of cervix uteri: Secondary | ICD-10-CM | POA: Diagnosis not present

## 2022-07-19 DIAGNOSIS — I959 Hypotension, unspecified: Secondary | ICD-10-CM | POA: Diagnosis present

## 2022-07-19 DIAGNOSIS — G43709 Chronic migraine without aura, not intractable, without status migrainosus: Secondary | ICD-10-CM | POA: Diagnosis present

## 2022-07-19 DIAGNOSIS — G40909 Epilepsy, unspecified, not intractable, without status epilepticus: Secondary | ICD-10-CM

## 2022-07-19 LAB — VALPROIC ACID LEVEL: Valproic Acid Lvl: 25 ug/mL — ABNORMAL LOW (ref 50.0–100.0)

## 2022-07-19 LAB — COMPREHENSIVE METABOLIC PANEL
ALT: 5 IU/L (ref 0–32)
ALT: 8 U/L (ref 0–44)
AST: 16 IU/L (ref 0–40)
AST: 18 U/L (ref 15–41)
Albumin/Globulin Ratio: 2.2 (ref 1.2–2.2)
Albumin: 4.9 g/dL (ref 3.8–4.9)
Albumin: 3.8 g/dL (ref 3.5–5.0)
Alkaline Phosphatase: 197 IU/L — ABNORMAL HIGH (ref 44–121)
Alkaline Phosphatase: 139 U/L — ABNORMAL HIGH (ref 38–126)
Anion gap: 11 (ref 5–15)
BUN/Creatinine Ratio: 11 (ref 9–23)
BUN: 10 mg/dL (ref 6–24)
BUN: 11 mg/dL (ref 6–20)
Bilirubin Total: 0.3 mg/dL (ref 0.0–1.2)
CO2: 20 mmol/L (ref 20–29)
CO2: 20 mmol/L — ABNORMAL LOW (ref 22–32)
Calcium: 10 mg/dL (ref 8.7–10.2)
Calcium: 8.3 mg/dL — ABNORMAL LOW (ref 8.9–10.3)
Chloride: 106 mmol/L (ref 96–106)
Chloride: 106 mmol/L (ref 98–111)
Creatinine, Ser: 0.95 mg/dL (ref 0.57–1.00)
Creatinine, Ser: 1 mg/dL (ref 0.44–1.00)
GFR, Estimated: >60 mL/min (ref >60)
Globulin, Total: 2.2 g/dL (ref 1.5–4.5)
Glucose, Bld: 121 mg/dL — ABNORMAL HIGH (ref 70–99)
Glucose: 73 mg/dL (ref 70–99)
Potassium: 3.8 mmol/L (ref 3.5–5.2)
Potassium: 2.9 mmol/L — ABNORMAL LOW (ref 3.5–5.1)
Sodium: 143 mmol/L (ref 134–144)
Sodium: 137 mmol/L (ref 135–145)
Total Bilirubin: 0.1 mg/dL — ABNORMAL LOW (ref 0.3–1.2)
Total Protein: 7.1 g/dL (ref 6.0–8.5)
Total Protein: 6.5 g/dL (ref 6.5–8.1)
eGFR: 72 mL/min/{1.73_m2} (ref >59)

## 2022-07-19 LAB — LIPASE, BLOOD: Lipase: 39 U/L (ref 11–51)

## 2022-07-19 LAB — URINALYSIS, ROUTINE W REFLEX MICROSCOPIC
Bilirubin Urine: NEGATIVE
Glucose, UA: NEGATIVE mg/dL
Hgb urine dipstick: NEGATIVE
Ketones, ur: NEGATIVE mg/dL
Leukocytes,Ua: NEGATIVE
Nitrite: NEGATIVE
Protein, ur: NEGATIVE mg/dL
Specific Gravity, Urine: 1.006 (ref 1.005–1.030)
pH: 6 (ref 5.0–8.0)

## 2022-07-19 LAB — TOPIRAMATE LEVEL: Topiramate Lvl: 15.2 ug/mL (ref 2.0–25.0)

## 2022-07-19 LAB — AMMONIA: Ammonia: 50 umol/L — ABNORMAL HIGH (ref 9–35)

## 2022-07-19 LAB — TROPONIN I (HIGH SENSITIVITY)
Troponin I (High Sensitivity): 3 ng/L (ref <18)
Troponin I (High Sensitivity): 3 ng/L (ref <18)

## 2022-07-19 LAB — VITAMIN B12: Vitamin B-12: 287 pg/mL (ref 180–914)

## 2022-07-19 LAB — TSH: TSH: 0.454 u[IU]/mL (ref 0.450–4.500)

## 2022-07-19 LAB — RPR: RPR Ser Ql: NONREACTIVE

## 2022-07-19 MED ORDER — POTASSIUM CHLORIDE CRYS ER 20 MEQ PO TBCR
40.0000 meq | EXTENDED_RELEASE_TABLET | Freq: Once | ORAL | Status: AC
  Administered 2022-07-19: 40 meq via ORAL
  Filled 2022-07-19: qty 2

## 2022-07-19 MED ORDER — ONDANSETRON HCL 4 MG/2ML IJ SOLN: 4.0000 mg | Freq: Four times a day (QID) | INTRAMUSCULAR | Status: DC | PRN

## 2022-07-19 MED ORDER — DIVALPROEX SODIUM ER 500 MG PO TB24
1000.0000 mg | ORAL_TABLET | Freq: Every day | ORAL | Status: DC
  Administered 2022-07-19: 1000 mg via ORAL
  Filled 2022-07-19: qty 2

## 2022-07-19 MED ORDER — ENOXAPARIN SODIUM 30 MG/0.3ML IJ SOSY
30.0000 mg | PREFILLED_SYRINGE | INTRAMUSCULAR | Status: DC
  Filled 2022-07-19: qty 0.3

## 2022-07-19 MED ORDER — NOREPINEPHRINE 4 MG/250ML-% IV SOLN
4.0000 ug/min | INTRAVENOUS | Status: DC
  Administered 2022-07-19: 4 ug/min via INTRAVENOUS
  Filled 2022-07-19 (×2): qty 250

## 2022-07-19 MED ORDER — SODIUM CHLORIDE 0.9 % IV SOLN: INTRAVENOUS | Status: AC

## 2022-07-19 MED ORDER — ONDANSETRON HCL 4 MG PO TABS: 4.0000 mg | ORAL_TABLET | Freq: Four times a day (QID) | ORAL | Status: DC | PRN

## 2022-07-19 MED ORDER — ACETAMINOPHEN 325 MG PO TABS
650.0000 mg | ORAL_TABLET | Freq: Four times a day (QID) | ORAL | Status: DC | PRN
  Administered 2022-07-19: 650 mg via ORAL
  Filled 2022-07-19: qty 2

## 2022-07-19 MED ORDER — OXYCODONE HCL 5 MG PO TABS
5.0000 mg | ORAL_TABLET | ORAL | Status: DC | PRN
  Administered 2022-07-19: 5 mg via ORAL
  Filled 2022-07-19 (×2): qty 1

## 2022-07-19 MED ORDER — ACETAMINOPHEN 650 MG RE SUPP: 650.0000 mg | Freq: Four times a day (QID) | RECTAL | Status: DC | PRN

## 2022-07-19 MED ORDER — PANTOPRAZOLE SODIUM 40 MG PO TBEC
40.0000 mg | DELAYED_RELEASE_TABLET | Freq: Every day | ORAL | Status: DC
  Administered 2022-07-19 – 2022-07-20 (×2): 40 mg via ORAL
  Filled 2022-07-19 (×2): qty 1

## 2022-07-19 MED ORDER — ENSURE ENLIVE PO LIQD
237.0000 mL | Freq: Two times a day (BID) | ORAL | Status: DC
  Administered 2022-07-19: 237 mL via ORAL
  Filled 2022-07-19 (×7): qty 237

## 2022-07-19 MED ORDER — IOHEXOL 350 MG/ML SOLN
60.0000 mL | Freq: Once | INTRAVENOUS | Status: AC | PRN
  Administered 2022-07-19: 60 mL via INTRAVENOUS

## 2022-07-19 MED ORDER — OCUVITE-LUTEIN PO CAPS
1.0000 | ORAL_CAPSULE | Freq: Every day | ORAL | Status: DC
  Administered 2022-07-19 – 2022-07-20 (×2): 1 via ORAL
  Filled 2022-07-19 (×2): qty 1

## 2022-07-19 MED ORDER — ENOXAPARIN SODIUM 40 MG/0.4ML IJ SOSY
40.0000 mg | PREFILLED_SYRINGE | INTRAMUSCULAR | Status: DC
  Administered 2022-07-19: 40 mg via SUBCUTANEOUS
  Filled 2022-07-19: qty 0.4

## 2022-07-19 MED ORDER — SODIUM CHLORIDE 0.9 % IV BOLUS
1000.0000 mL | Freq: Once | INTRAVENOUS | Status: AC
  Administered 2022-07-19: 1000 mL via INTRAVENOUS

## 2022-07-19 NOTE — ED Notes (Signed)
EEG at bedside at this time.

## 2022-07-19 NOTE — Progress Notes (Signed)
EEG complete - results pending 

## 2022-07-19 NOTE — Consult Note (Signed)
I connected with  Sharon Welch on 07/19/22 by a video enabled telemedicine application and verified that I am speaking with the correct person using two identifiers.   I discussed the limitations of evaluation and management by telemedicine. The patient expressed understanding and agreed to proceed.  Location of patient: [facility] Location of physician: [facility]  Neurology Consultation Reason for Consult: New onset dizziness Referring Physician: Dr. Heath Lark  CC: Dizziness slurred speech  History is obtained from: Patient  HPI: Sharon Welch is a 52 y.o. 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  Patient states she was feeling lightheaded since Wednesday, 07/17/2022.  However yesterday evening for 30 minutes after taking her medications including Depakote she felt dizzy described as spinning sensation as well as about to pass out and slurred speech.  Her daughter is a Marine scientist and was concerned about stroke.  Therefore brought her to emergency room.  CT head on arrival did not show any acute abnormality.  Patient was noted to be hypotensive with blood pressure 64/48.  Therefore she was given IV fluids and started on Levophed.  Neurology was consulted for further recommendations.  Currently, patient states her symptoms are improving but she still feels lightheaded when she suddenly sits up.  Reports cough for about 1 week but denies any other recent illness.  Seizure history: Patient describes seizures as aura of strange smell followed by loss of consciousness.  About 2 times a month will have staring episodes.  Has had at least 1 episode of sudden onset vertigo, confusion and time labs in January. Also at times will have generalized tonic-clonic seizure-like  activity.  Has been on Topamax for a long time but was switched to Depakote '1000mg'$  extended release at bedtime yesterday after seeing Dr. Krista Blue.  Previous AEDs: Topamax 100 mg twice daily  Current AEDs: Depakote ER 1000 mg nightly   ROS: All other systems reviewed and negative except as noted in the HPI.   Past Medical History:  Diagnosis Date   Bruising, spontaneous 12/08/2013   Cancer ([place])    cervical cancer - had hysterectomy   Chronic back pain    Chronic back pain    DDD (degenerative disc disease), lumbar    DDD (degenerative disc disease), lumbar    Fibromyalgia    Headache(784.0)    migraines   Ovarian cyst    Partial tear subscapularis tendon 12/27/2011   Scoliosis    Seizures ([place])    started 9/12-    Family History  Problem Relation Age of Onset   Hypertension Mother    Hypertension Father    Cancer Father        prostate   Heart disease Father    Hypertension Brother    Hypertension Brother     Social History:  reports that she has been smoking cigarettes. She has a 10.00 pack-year smoking history. She has never used smokeless tobacco. She reports that she does not currently use alcohol. She reports that she does not use drugs.   Exam: Current vital signs: BP 125/67 (BP Location: Left Arm)   Pulse 83   Temp 97.9 F (36.6 C) (Oral)   Resp 19   Ht 5' (1.524 m)   Wt 39.9 kg   SpO2 98%   BMI 17.19 kg/m  Vital signs in  last 24 hours: Temp:  [97.9 F (36.6 C)-98 F (36.7 C)] 97.9 F (36.6 C) (01/26 0946) Pulse Rate:  [60-118] 83 (01/26 1130) Resp:  [14-32] 19 (01/26 1130) BP: (63-139)/(43-81) 125/67 (01/26 1130) SpO2:  [95 %-100 %] 98 % (01/26 1130) Weight:  [39.9 kg-40.1 kg] 39.9 kg (01/25 2327)   Physical Exam  Constitutional: Appears well-developed and well-nourished.  Psych: Affect appropriate to situation Eyes: No scleral injection Neuro: AOx3, no aphasia, cranial nerves II to XII grossly intact, antigravity strength in all 4 extremities  without drift, FTN intact bilaterally, sensation intact to light touch  I have reviewed labs in epic and the results pertinent to this consultation are: CBC:  Recent Labs  Lab 07/18/22 2325  WBC 8.4  NEUTROABS 5.1  HGB 12.9  HCT 37.8  MCV 90.9  PLT 673    Basic Metabolic Panel:  Lab Results  Component Value Date   NA 137 07/18/2022   K 2.9 (L) 07/18/2022   CO2 20 (L) 07/18/2022   GLUCOSE 121 (H) 07/18/2022   BUN 11 07/18/2022   CREATININE 1.00 07/18/2022   CALCIUM 8.3 (L) 07/18/2022   GFRNONAA >60 07/18/2022   GFRAA >60 07/07/2019   Lipid Panel: No results found for: "LDLCALC" HgbA1c:  Lab Results  Component Value Date   HGBA1C 5.2 05/15/2019   Urine Drug Screen:     Component Value Date/Time   LABOPIA POSITIVE (A) 05/07/2019 1324   COCAINSCRNUR NONE DETECTED 05/07/2019 1324   LABBENZ NONE DETECTED 05/07/2019 1324   AMPHETMU NONE DETECTED 05/07/2019 1324   THCU NONE DETECTED 05/07/2019 1324   LABBARB NONE DETECTED 05/07/2019 1324    Alcohol Level     Component Value Date/Time   ETH <10 05/07/2019 1323    I have reviewed the images obtained: CT head without contrast 07/19/2022: No acute intracranial abnormality.   ASSESSMENT/PLAN: 52 year old female with dizziness and slurred speech which initially started on 07/13/2022 but worsened significantly yesterday evening.  Dizziness -Differentials include hypotension due to recent illness (reports cough for about 1 week), medication side effect (was started on Depakote and took first dose yesterday evening), cerebellar stroke, less likely seizure due to the long duration  Recommendations: -Patient is unable to obtain MRI brain at [facility] due to rods in the back and an event MRI does not have protocol for this.  Patient does states she is able to obtain MRI with these so if symptoms do not resolve, patient can be transferred to [facility] for MRI tomorrow -In the meantime, we will obtain CTA head and neck  to look for any stenosis, vertebral occlusion/dissection -Will check orthostatic vital signs -EEG ordered and pending -As the symptoms started before depakote administration and worsened within 30 minutes of an extended release medication, I would like to try another dose tonight.  If symptoms worsen again, then would recommend discontinuing Depakote and adding it to her allergy list.  Per Dr. Rhea Belton note, would then switch patient to Trileptal, started 150 mg twice daily and in 2 weeks can increase to 300 mg twice daily.  Of note, patient has had hyponatremia in the past and Trileptal can worsen hyponatremia.  Therefore will need repeat BMP in a week if patient is started on Trileptal and will need to be counseled about hyponatremia -We will also check ammonia as Depakote can cause induced hyperammonemia -If symptoms do not improve, please discuss case with Dr. Leonel Ramsay tomorrow and potentially transfer patient to [facility] for neurology evaluation -PT/OT -  Discussed plan with Dr. Manuella Ghazi   Thank you for allowing [place] to participate in the care of this patient. If you have any further questions, please contact  me or neurohospitalist.   Zeb Comfort [facility] neurohospitalist

## 2022-07-19 NOTE — H&P (Addendum)
History and Physical    Patient: Sharon Welch XBD:[id number] DOB: 1970-08-29 DOA: 07/18/2022 DOS: the patient was seen and examined on 07/19/2022 PCP: Redmond School, MD  Patient coming from: Home  Chief Complaint:  Chief Complaint  Patient presents with   Neurologic Problem   HPI: Sharon Welch is a 52 y.o. female with medical history significant of complex partial seizures, left temporoparietal lobe cavernoma, chronic migraine headaches, scoliosis, lumbar DDD who presents to the emergency department due to concern for possible stroke.  Patient states that she went to a neurologist yesterday (1/25) and her Topamax was changed to Depakote due to still having seizures despite being on Topamax.  Patient states that few hours after taking Depakote, daughter noted that she was confused and her speech was slurred and there was also concern for dizziness and headache.  Patient denies nausea, vomiting, diarrhea, fever, chills.  ED Course:  In the emergency department, she was hypotensive with a BP of 74/51 on arrival, but other vital signs within normal.  Workup in the ED showed normal CBC and BMP except for hypokalemia and blood glucose of 121, alkaline phosphatase was 139, troponin x 2 was flat at 3, lipase 39, lactic acid was normal.  Blood culture pending. Chest x-ray showed no active disease CT head without contrast showed no acute intracranial abnormality IV hydration with 2 L of NS was provided, IV Levophed was started.  Hospitalist was asked to admit patient for further evaluation and management.  Review of Systems: Review of systems as noted in the HPI. All other systems reviewed and are negative.   Past Medical History:  Diagnosis Date   Bruising, spontaneous 12/08/2013   Cancer ([place])    cervical cancer - had hysterectomy   Chronic back pain    Chronic back pain    DDD (degenerative disc disease), lumbar    DDD (degenerative disc disease), lumbar    Fibromyalgia    Headache(784.0)     migraines   Ovarian cyst    Partial tear subscapularis tendon 12/27/2011   Scoliosis    Seizures ([place])    started 9/12-   Past Surgical History:  Procedure Laterality Date   ABDOMINAL HYSTERECTOMY  2012   BACK SURGERY  1989   scoliosis throsic-rods   CESAREAN SECTION     CHOLECYSTECTOMY N/A 11/17/2012   Procedure: LAPAROSCOPIC CHOLECYSTECTOMY WITH INTRAOPERATIVE CHOLANGIOGRAM;  Surgeon: Ralene Ok, MD;  Location: [facility];  Service: General;  Laterality: N/A;   COLONOSCOPY WITH PROPOFOL N/A 04/21/2020   Procedure: COLONOSCOPY WITH PROPOFOL;  Surgeon: Carol Ada, MD;  Location: [facility];  Service: Endoscopy;  Laterality: N/A;   ENDOSCOPIC RETROGRADE CHOLANGIOPANCREATOGRAPHY (ERCP) WITH PROPOFOL N/A 06/11/2019   Procedure: ENDOSCOPIC RETROGRADE CHOLANGIOPANCREATOGRAPHY (ERCP) WITH PROPOFOL;  Surgeon: Carol Ada, MD;  Location: [facility];  Service: Endoscopy;  Laterality: N/A;   ERCP N/A 05/07/2013   Procedure: ENDOSCOPIC RETROGRADE CHOLANGIOPANCREATOGRAPHY (ERCP);  Surgeon: Beryle Beams, MD;  [facility];  Service: Endoscopy;  Laterality: N/A;  start ercp first, if canulation fails switch to EUS    ESOPHAGOGASTRODUODENOSCOPY (EGD) WITH PROPOFOL N/A 05/15/2019   Procedure: ESOPHAGOGASTRODUODENOSCOPY (EGD) WITH PROPOFOL;  Surgeon: Danie Binder, MD;  Location: [facility];  Service: Endoscopy;  Laterality: N/A;   ESOPHAGOGASTRODUODENOSCOPY (EGD) WITH PROPOFOL N/A 03/03/2020   Procedure: ESOPHAGOGASTRODUODENOSCOPY (EGD) WITH PROPOFOL;  Surgeon: Carol Ada, MD;  [facility];  Service: Endoscopy;  Laterality: N/A;   ESOPHAGOGASTRODUODENOSCOPY (EGD) WITH PROPOFOL N/A 04/21/2020   Procedure: ESOPHAGOGASTRODUODENOSCOPY (EGD) WITH  PROPOFOL;  Surgeon: Carol Ada, MD;  Location: [facility];  Service: Endoscopy;  Laterality: N/A;   EUS N/A 11/03/2012   Procedure: UPPER ENDOSCOPIC ULTRASOUND (EUS) LINEAR;  Surgeon: Beryle Beams, MD;  [facility];  Service: Endoscopy;  Laterality: N/A;   left arm     NECK SURGERY     POLYPECTOMY  04/21/2020   Procedure: POLYPECTOMY;  Surgeon: Carol Ada, MD;  Location[facility];  Service: Endoscopy;;   REMOVAL OF STONES  06/11/2019   Procedure: REMOVAL OF STONES;  Surgeon: Carol Ada, MD;  Location: [facility];  Service: Endoscopy;;   TOOTH EXTRACTION N/A 05/24/2022   Procedure: DENTAL RESTORATION/EXTRACTIONS;  Surgeon: Diona Browner, DMD;  Location: [facility];  Service: Oral Surgery;  Laterality: N/A;   TUBAL LIGATION     UPPER ESOPHAGEAL ENDOSCOPIC ULTRASOUND (EUS) N/A 06/11/2019   Procedure: UPPER ESOPHAGEAL ENDOSCOPIC ULTRASOUND (EUS);  Surgeon: Carol Ada, MD;  Location: [facility];  Service: Endoscopy;  Laterality: N/A;    Social History:  reports that she has been smoking cigarettes. She has a 10.00 pack-year smoking history. She has never used smokeless tobacco. She reports that she does not currently use alcohol. She reports that she does not use drugs.   Allergies  Allergen Reactions   Coconut Flavor Swelling   Tramadol Other (See Comments)    Possible seizures   Aleve [Naproxen] Other (See Comments)    Ulcers   Pineapple Other (See Comments)    Migraine    Family History  Problem Relation Age of Onset   Hypertension Mother    Hypertension Father    Cancer Father        prostate   Heart disease Father    Hypertension Brother    Hypertension Brother      Prior to Admission medications   Medication Sig Start Date End Date Taking? Authorizing Provider  albuterol (VENTOLIN HFA) 108 (90 Base) MCG/ACT inhaler Inhale 1-2 puffs into the lungs every 4 (four) hours as needed for wheezing or shortness of breath.    [provider]  amitriptyline (ELAVIL) 25 MG tablet Take 50 mg by mouth at bedtime.    [provider]  dexlansoprazole (DEXILANT) 60 MG capsule Take 60 mg by mouth daily.    [provider]  diclofenac Sodium (VOLTAREN) 1  % GEL Apply 2 g topically daily as needed (Pain). 02/12/22   [provider]  divalproex (DEPAKOTE ER) 500 MG 24 hr tablet Take 2 tablets (1,000 mg total) by mouth at bedtime. 07/18/22   Marcial Pacas, MD  folic acid (FOLVITE) 1 MG tablet Take 1 mg by mouth daily. 04/15/22   [provider]  hydrOXYzine (ATARAX) 50 MG tablet Take 25 mg by mouth at bedtime. 01/28/22   [provider]  linaclotide (LINZESS) 145 MCG CAPS capsule Take 145 mcg by mouth daily before breakfast.    [provider]  Milnacipran (SAVELLA) 50 MG TABS tablet Take 50 mg by mouth 2 (two) times daily.    [provider]  naloxone [facility]) nasal spray 4 mg/0.1 mL Place 1 spray into the nose once. 01/16/22   [provider]  promethazine (PHENERGAN) 25 MG tablet Take 25 mg by mouth every 4 (four) hours as needed for nausea or vomiting.     [provider]  rizatriptan (MAXALT) 10 MG tablet Take 10 mg by mouth as needed for migraine. May repeat in 2 hours if needed    [provider]  tiZANidine (  ZANAFLEX) 2 MG tablet Take 2 mg by mouth 2 (two) times daily.    [provider]  topiramate (TOPAMAX) 100 MG tablet Take 100 mg by mouth 2 (two) times daily.    [provider]    Physical Exam: BP 107/61   Pulse 71   Temp 97.9 F (36.6 C) (Oral)   Resp 20   Ht 5' (1.524 m)   Wt 39.9 kg   SpO2 98%   BMI 17.19 kg/m   General: 52 y.o. year-old female cachectic, chronically ill appearing, but in no acute distress.  Alert and oriented x3. HEENT: NCAT, EOMI, dry mucous membrane Neck: Supple, trachea medial Cardiovascular: Regular rate and rhythm with no rubs or gallops.  No thyromegaly or JVD noted.  No lower extremity edema. 2/4 pulses in all 4 extremities. Respiratory: Clear to auscultation with no wheezes or rales. Good inspiratory effort. Abdomen: Soft, nontender nondistended with normal bowel sounds x4 quadrants. Muskuloskeletal: No cyanosis,  clubbing or edema noted bilaterally Neuro: CN II-XII intact, strength 5/5 x 4, sensation, reflexes intact Skin: No ulcerative lesions noted or rashes Psychiatry: Judgement and insight appear normal. Mood is appropriate for condition and setting          Labs on Admission:  Basic Metabolic Panel: Recent Labs  Lab 07/18/22 2325  NA 137  K 2.9*  CL 106  CO2 20*  GLUCOSE 121*  BUN 11  CREATININE 1.00  CALCIUM 8.3*   Liver Function Tests: Recent Labs  Lab 07/18/22 2325  AST 18  ALT 8  ALKPHOS 139*  BILITOT 0.1*  PROT 6.5  ALBUMIN 3.8   Recent Labs  Lab 07/18/22 2325  LIPASE 39   No results for input(s): "AMMONIA" in the last 168 hours. CBC: Recent Labs  Lab 07/18/22 2325  WBC 8.4  NEUTROABS 5.1  HGB 12.9  HCT 37.8  MCV 90.9  PLT 255   Cardiac Enzymes: No results for input(s): "CKTOTAL", "CKMB", "CKMBINDEX", "TROPONINI" in the last 168 hours.  BNP (last 3 results) No results for input(s): "BNP" in the last 8760 hours.  ProBNP (last 3 results) No results for input(s): "PROBNP" in the last 8760 hours.  CBG: No results for input(s): "GLUCAP" in the last 168 hours.  Radiological Exams on Admission: DG Chest Port 1 View  Result Date: 07/19/2022 CLINICAL DATA:  weakness EXAM: PORTABLE CHEST 1 VIEW COMPARISON:  Chest x-ray 05/31/2019 FINDINGS: The heart and mediastinal contours are unchanged. Aortic calcification. No focal consolidation. No pulmonary edema. No pleural effusion. No pneumothorax. No acute osseous abnormality. Partially visualized thoracolumbar surgical hardware. IMPRESSION: 1. No active disease. 2.  Aortic Atherosclerosis (ICD10-I70.0). Electronically Signed   By: Iven Finn M.D.   On: 07/19/2022 00:12   CT Head Wo Contrast  Result Date: 07/19/2022 CLINICAL DATA:  Mental status change, unknown cause. Pt C/o dizziness, headache, slurred speech, confusion. Pt states they had a seizure last night. Pt is hypotensive. EXAM: CT HEAD WITHOUT  CONTRAST TECHNIQUE: Contiguous axial images were obtained from the base of the skull through the vertex without intravenous contrast. RADIATION DOSE REDUCTION: This exam was performed according to the departmental dose-optimization program which includes automated exposure control, adjustment of the mA and/or kV according to patient size and/or use of iterative reconstruction technique. COMPARISON:  CT head 05/07/2019 FINDINGS: Brain: No evidence of large-territorial acute infarction. No parenchymal hemorrhage. No mass lesion. No extra-axial collection. No mass effect or midline shift. No hydrocephalus. Basilar cisterns are patent. Vascular: No hyperdense vessel. Skull:  No acute fracture or focal lesion. Sinuses/Orbits: Paranasal sinuses and mastoid air cells are clear. The orbits are unremarkable. Other: None. IMPRESSION: No acute intracranial abnormality. Electronically Signed   By: Iven Finn M.D.   On: 07/19/2022 00:05    EKG: I independently viewed the EKG done and my findings are as followed: Normal sinus rhythm at a rate of 79 bpm  Assessment/Plan Present on Admission:  Idiopathic hypotension  PUD (peptic ulcer disease)-EGD 05/15/2019 with bleeding pyloric/gastric ulcer, nonbleeding duodenal ulcers and gastritis  Chronic migraine w/o aura w/o status migrainosus, not intractable  DDD (degenerative disc disease), lumbosacral  Hypokalemia  Principal Problem:   Idiopathic hypotension Active Problems:   Hypokalemia   DDD (degenerative disc disease), lumbosacral   PUD (peptic ulcer disease)-EGD 05/15/2019 with bleeding pyloric/gastric ulcer, nonbleeding duodenal ulcers and gastritis   Seizure disorder (HCC)   Chronic migraine w/o aura w/o status migrainosus, not intractable   Slurred speech   Underweight  Idiopathic hypotension Hypotension in this patient may be multifactorial, including medication side effects in Zanaflex, Depakote Continue IV hydration Continue IV pressor with plan  to wean patient off this Continue compression stockings Consider midodrine if patient's BP continues to be in hypotensive range Orthostatic vital signs after patient is being weaned off pressors  Slurred speech, rule out acute ischemic stroke Patient complaining of slurred speech, confusion, dizziness and headache.  Confusion lasted about 30 minutes prior to returning to baseline.  It was not certain if patient had a recurrent seizure or TIA or medication side effects CT of head without contrast showed no acute intracranial abnormality MRI of brain cannot be done here at [facility] due to patient having rods in her back, however, outpatient neurologist already scheduled her for MRI possibly in [place]. EEG will be done in the morning Consider neurology consult based on findings  Hypokalemia K+ 2.9, this will be replenished  History of peptic ulcer disease Continue Protonix  Complex partial seizures Continue Depakote; monitor for side effects especially since current valproic acid (25) is below therapeutic level  Chronic migraine headaches Hold rizatriptan at this time due to side effect profile  Lumbar DDD Tylenol as needed  Underweight (BMI 17.19) Dietitian will be consulted and we shall await further recommendation   DVT prophylaxis: Lovenox  Code Status: Full code  Consults: Dietitian  Family Communication: None at bedside  Severity of Illness: The appropriate patient status for this patient is OBSERVATION. Observation status is judged to be reasonable and necessary in order to provide the required intensity of service to ensure the patient's safety. The patient's presenting symptoms, physical exam findings, and initial radiographic and laboratory data in the context of their medical condition is felt to place them at decreased risk for further clinical deterioration. Furthermore, it is anticipated that the patient will be medically stable for discharge from the hospital within  2 midnights of admission.   Author: Bernadette Hoit, DO 07/19/2022 6:29 AM  For on call review [email or web address].

## 2022-07-19 NOTE — Progress Notes (Signed)
  Transition of Care [facility]) Screening Note   Patient Details  Name: Sharon Welch Date of Birth: 03/24/1971   Transition of Care [facility]) CM/SW Contact:    Iona Beard, [place] Phone Number: 07/19/2022, 9:48 AM    Transition of Care Department [facility]) has reviewed patient and no TOC needs have been identified at this time. We will continue to monitor patient advancement through interdisciplinary progression rounds. If new patient transition needs arise, please place a TOC consult.

## 2022-07-19 NOTE — ED Notes (Signed)
Pt's clothes were wet from urine due to dislodged purewick; pt changed clothes and new bed linens placed; pt repositioned

## 2022-07-19 NOTE — Progress Notes (Signed)
Initial Nutrition Assessment  DOCUMENTATION CODES:   Non-severe (moderate) malnutrition in context of acute illness/injury  Underweight  INTERVENTION:  Ensure Enlive BID (chocolate flavor preferred)  Multivitamin daily  Check Vitamin D and B-12  High Calorie /High Protein nutrition education   NUTRITION DIAGNOSIS:   Moderate Malnutrition related to  (acute change in eating pattern and food choices due to loss of teeth) as evidenced by per patient/family report, mild fat depletion, mild muscle depletion, energy intake < 75% for > 7 days, percent weight loss.   GOAL:  Patient will meet greater than or equal to 90% of their needs   MONITOR:  PO intake, Supplement acceptance, Weight trends, Labs  REASON FOR ASSESSMENT:   Consult Assessment of nutrition requirement/status  ASSESSMENT: Patient is a 52 yo female from home with hx of chronic migraine headaches, lumbar DDD, left temporoparietal lobe cavernoma. Presents with idiopathic hypotension and concern for cerebellar CVA or seizure. She is off pressor support at time of RD visit.   Patient had teeth pulled in December of 2023 and just recently received new dentures. They are not fitting well and need to be adjusted. Patient contributes change in eating pattern due to loss of ability to chew food as usual. She likes Ensure but has not been drinking consistently. At baseline she eats breakfast and dinner most days (occasionally will eat lunch). Able to feed herself.  Patient says, her family are "always on me about eating". Encouraged patient to consume ONS while unable to eat consistently and consume protein each meal. Nutrition education handout attached to AVS.   Patient says she has always been thin the most she weighed was 114 lb during pregnancy. Current 39.9 kg (88 lb). Acute weight loss of 5.5 kg (12%) x 8 weeks which is significant.   Medications reviewed.      Latest Ref Rng & Units 07/18/2022   11:25 PM 07/18/2022     2:56 PM 05/24/2022    5:52 AM  BMP  Glucose 70 - 99 mg/dL 121  73  100   BUN 6 - 20 mg/dL '11  10  7   '$ Creatinine 0.44 - 1.00 mg/dL 1.00  0.95  0.87   BUN/Creat Ratio 9 - 23  11    Sodium 135 - 145 mmol/L 137  143  139   Potassium 3.5 - 5.1 mmol/L 2.9  3.8  3.9   Chloride 98 - 111 mmol/L 106  106  108   CO2 22 - 32 mmol/L '20  20  25   '$ Calcium 8.9 - 10.3 mg/dL 8.3  10.0  9.4       NUTRITION - FOCUSED PHYSICAL EXAM:  Flowsheet Row Most Recent Value  Orbital Region Mild depletion  Upper Arm Region Moderate depletion  Thoracic and Lumbar Region Mild depletion  Temple Region Mild depletion  Clavicle Bone Region Mild depletion  Clavicle and Acromion Bone Region Moderate depletion  Scapular Bone Region Mild depletion  Dorsal Hand Moderate depletion  Patellar Region Moderate depletion  Anterior Thigh Region Severe depletion  Posterior Calf Region Severe depletion  Edema (RD Assessment) None  Hair Reviewed  Eyes Reviewed  Mouth Reviewed  [endentulous]  Skin Reviewed  Nails Reviewed       Diet Order:   Diet Order             Diet regular Room service appropriate? Yes; Fluid consistency: Thin  Diet effective now  EDUCATION NEEDS:  Education needs have been addressed  Skin:  Skin Assessment: Reviewed RN Assessment  Last BM:  PTA  Height:   Ht Readings from Last 1 Encounters:  07/18/22 5' (1.524 m)    Weight:   Wt Readings from Last 1 Encounters:  07/18/22 39.9 kg    Ideal Body Weight:   45 kg  BMI:  Body mass index is 17.19 kg/m.  Estimated Nutritional Needs:   Kcal:  1600-1800  Protein:  80 gr protein  Fluid:  >1500 ml daily  Colman Cater MS,RD,CSG,LDN Contact: Shea Evans

## 2022-07-19 NOTE — ED Notes (Signed)
Pt appears to be comfortable and resting, can observe even RR, pt remains on cardiac monitoring devices, no changes noted, side rails up x2 for safety, NAD noted, plan of care ongoing, call light within reach, no further concerns as of present.

## 2022-07-19 NOTE — ED Notes (Signed)
Patient transported to CT 

## 2022-07-19 NOTE — ED Notes (Signed)
Pt has been off of Levophed for a couple of hours now, BP is currently 122/74

## 2022-07-19 NOTE — Procedures (Signed)
Patient Name: Theresa Wedel Auguste  MRN: [id number]  Epilepsy Attending: Lora Havens  Referring Physician/Provider: Bernadette Hoit, DO  Date: 07/19/2022 Duration: 23.23 mins  Patient history: 52yo F with h/o seizures now with dizziness. EEG to evaluate for seizure  Level of alertness: Awake  AEDs during EEG study: VPA  Technical aspects: This EEG study was done with scalp electrodes positioned according to the 10-20 International system of electrode placement. Electrical activity was reviewed with band pass filter of 1-'70Hz'$ , sensitivity of 7 uV/mm, display speed of 30m/sec with a '60Hz'$  notched filter applied as appropriate. EEG data were recorded continuously and digitally stored.  Video monitoring was available and reviewed as appropriate.  Description: The posterior dominant rhythm consists of 9-10 Hz activity of moderate voltage (25-35 uV) seen predominantly in posterior head regions, symmetric and reactive to eye opening and eye closing.  Hyperventilation and photic stimulation were not performed.     IMPRESSION: This study is within normal limits. No seizures or epileptiform discharges were seen throughout the recording.  A normal interictal EEG does not exclude the diagnosis of epilepsy.   Kamia Insalaco OBarbra Sarks

## 2022-07-19 NOTE — Progress Notes (Signed)
Per HPI: Sharon Welch is a 52 y.o. female with medical history significant of complex partial seizures, left temporoparietal lobe cavernoma, chronic migraine headaches, scoliosis, lumbar DDD who presents to the emergency department due to concern for possible stroke.  Patient states that she went to a neurologist yesterday (1/25) and her Topamax was changed to Depakote due to still having seizures despite being on Topamax.  Patient states that few hours after taking Depakote, daughter noted that she was confused and her speech was slurred and there was also concern for dizziness and headache.  Patient denies nausea, vomiting, diarrhea, fever, chills.   Patient was admitted with idiopathic hypotension possibly related to recent illness or medication side effect.  There is also concern for cerebellar CVA or possibly seizure.  She was seen by neurology with recommendations to obtain CTA head and neck and check orthostatics.  EEG is pending.  She will receive another dose of Depakote tonight to see what her reaction is and further management will vary depending on her reaction to this.  Patient seen and evaluated at bedside and has now been weaned off of norepinephrine and okay for transfer to telemetry.  A.m. labs ordered.  She has been admitted after midnight.  Total care time: 30 minutes.

## 2022-07-20 DIAGNOSIS — I950 Idiopathic hypotension: Secondary | ICD-10-CM | POA: Diagnosis not present

## 2022-07-20 LAB — COMPREHENSIVE METABOLIC PANEL
ALT: 8 U/L (ref 0–44)
AST: 14 U/L — ABNORMAL LOW (ref 15–41)
Albumin: 2.9 g/dL — ABNORMAL LOW (ref 3.5–5.0)
Alkaline Phosphatase: 99 U/L (ref 38–126)
Anion gap: 6 (ref 5–15)
BUN: 10 mg/dL (ref 6–20)
CO2: 20 mmol/L — ABNORMAL LOW (ref 22–32)
Calcium: 7.8 mg/dL — ABNORMAL LOW (ref 8.9–10.3)
Chloride: 115 mmol/L — ABNORMAL HIGH (ref 98–111)
Creatinine, Ser: 0.63 mg/dL (ref 0.44–1.00)
GFR, Estimated: >60 mL/min (ref >60)
Glucose, Bld: 83 mg/dL (ref 70–99)
Potassium: 3.5 mmol/L (ref 3.5–5.1)
Sodium: 141 mmol/L (ref 135–145)
Total Bilirubin: 0.1 mg/dL — ABNORMAL LOW (ref 0.3–1.2)
Total Protein: 5.1 g/dL — ABNORMAL LOW (ref 6.5–8.1)

## 2022-07-20 LAB — MAGNESIUM: Magnesium: 1.8 mg/dL (ref 1.7–2.4)

## 2022-07-20 LAB — URINE CULTURE: Culture: NO GROWTH

## 2022-07-20 LAB — PHOSPHORUS: Phosphorus: 3.3 mg/dL (ref 2.5–4.6)

## 2022-07-20 LAB — CBC
HCT: 31.1 % — ABNORMAL LOW (ref 36.0–46.0)
Hemoglobin: 10.4 g/dL — ABNORMAL LOW (ref 12.0–15.0)
MCH: 30.6 pg (ref 26.0–34.0)
MCHC: 33.4 g/dL (ref 30.0–36.0)
MCV: 91.5 fL (ref 80.0–100.0)
Platelets: 172 10*3/uL (ref 150–400)
RBC: 3.4 MIL/uL — ABNORMAL LOW (ref 3.87–5.11)
RDW: 12 % (ref 11.5–15.5)
WBC: 4.7 10*3/uL (ref 4.0–10.5)
nRBC: 0 % (ref 0.0–0.2)

## 2022-07-20 LAB — HIV ANTIBODY (ROUTINE TESTING W REFLEX): HIV Screen 4th Generation wRfx: NONREACTIVE

## 2022-07-20 NOTE — Plan of Care (Signed)
  Problem: Acute Rehab PT Goals(only PT should resolve) Goal: Pt Will Go Supine/Side To Sit Outcome: Progressing Flowsheets (Taken 07/20/2022 0909) Pt will go Supine/Side to Sit: Independently Goal: Patient Will Transfer Sit To/From Stand Outcome: Progressing Flowsheets (Taken 07/20/2022 0909) Patient will transfer sit to/from stand: Independently Goal: Pt Will Transfer Bed To Chair/Chair To Bed Outcome: Progressing Flowsheets (Taken 07/20/2022 0909) Pt will Transfer Bed to Chair/Chair to Bed: Independently Goal: Pt Will Ambulate Outcome: Progressing Flowsheets (Taken 07/20/2022 0909) Pt will Ambulate:  > 125 feet  Independently

## 2022-07-20 NOTE — Discharge Summary (Signed)
Physician Discharge Summary  Sharon Welch ZDG:[id number] DOB: [birth year] DOA: 07/18/2022  PCP: Redmond School, MD  Admit date: 07/18/2022  Discharge date: 07/20/2022  Admitted From:Home  Disposition:  Home  Recommendations for Outpatient Follow-up:  Follow up with PCP in 1-2 weeks Follow-up with neurology as scheduled Continue home medications as previously prescribed to include Depakote  Home Health: None  Equipment/Devices: None  Discharge Condition:Stable  CODE STATUS: Full  Diet recommendation: Heart Healthy  Brief/Interim Summary: Per HPI: Sharon Welch is a 52 y.o. female with medical history significant of complex partial seizures, left temporoparietal lobe cavernoma, chronic migraine headaches, scoliosis, lumbar DDD who presents to the emergency department due to concern for possible stroke.  Patient states that she went to a neurologist yesterday (1/25) and her Topamax was changed to Depakote due to still having seizures despite being on Topamax.  Patient states that few hours after taking Depakote, daughter noted that she was confused and her speech was slurred and there was also concern for dizziness and headache.  Patient denies nausea, vomiting, diarrhea, fever, chills.    Patient was admitted with idiopathic hypotension possibly related to recent illness or medication side effect.  There is also concern for cerebellar CVA or possibly seizure.  She was seen by neurology with recommendations to obtain CTA head and neck and check orthostatics as well as EEG and everything is within normal limits.  She received another dose of Depakote on the evening of 1/26 and tolerated this quite well.  Blood pressures have remained stable off of norepinephrine for over 12 hours and she was ambulated by physical therapy with no home needs noted.  She is in stable condition for discharge at this point with no other acute events noted.  Discharge Diagnoses:  Principal Problem:   Idiopathic  hypotension Active Problems:   Hypokalemia   DDD (degenerative disc disease), lumbosacral   PUD (peptic ulcer disease)-EGD 05/15/2019 with bleeding pyloric/gastric ulcer, nonbleeding duodenal ulcers and gastritis   Seizure disorder (HCC)   Chronic migraine w/o aura w/o status migrainosus, not intractable   Slurred speech   Underweight   Hypotension  Principal discharge diagnosis: Idiopathic hypotension-resolved with no significant findings on workup.  Discharge Instructions  Discharge Instructions     Diet - low sodium heart healthy   Complete by: As directed    Increase activity slowly   Complete by: As directed       Allergies as of 07/20/2022       Reactions   Coconut Flavor Swelling   Tramadol Other (See Comments)   Possible seizures   Aleve [naproxen] Other (See Comments)   Ulcers   Pineapple Other (See Comments)   Migraine        Medication List     TAKE these medications    albuterol 108 (90 Base) MCG/ACT inhaler Commonly known as: VENTOLIN HFA Inhale 1-2 puffs into the lungs every 4 (four) hours as needed for wheezing or shortness of breath.   amitriptyline 25 MG tablet Commonly known as: ELAVIL Take 50 mg by mouth at bedtime.   dexlansoprazole 60 MG capsule Commonly known as: DEXILANT Take 60 mg by mouth daily.   diclofenac Sodium 1 % Gel Commonly known as: VOLTAREN Apply 2 g topically daily as needed (Pain).   divalproex 500 MG 24 hr tablet Commonly known as: Depakote ER Take 2 tablets (1,000 mg total) by mouth at bedtime.   folic acid 1 MG tablet Commonly known as: FOLVITE Take 1 mg by  mouth daily.   HYDROcodone-acetaminophen 10-325 MG tablet Commonly known as: NORCO Take 1 tablet by mouth every 6 (six) hours as needed for moderate pain or severe pain.   hydrOXYzine 25 MG tablet Commonly known as: ATARAX Take 25 mg by mouth 4 (four) times daily as needed.   Linzess 145 MCG Caps capsule Generic drug: linaclotide Take 145 mcg by  mouth daily before breakfast.   naloxone 4 MG/0.1ML Liqd nasal spray kit Commonly known as: NARCAN Place 1 spray into the nose once.   promethazine 25 MG tablet Commonly known as: PHENERGAN Take 25 mg by mouth every 4 (four) hours as needed for nausea or vomiting.   rizatriptan 10 MG tablet Commonly known as: MAXALT Take 10 mg by mouth as needed for migraine. May repeat in 2 hours if needed   Savella 50 MG Tabs tablet Generic drug: Milnacipran Take 50 mg by mouth 2 (two) times daily.   tiZANidine 2 MG tablet Commonly known as: ZANAFLEX Take 2 mg by mouth 2 (two) times daily.        Follow-up Information     Redmond School, MD. Schedule an appointment as soon as possible for a visit in 1 week(s).   Specialty: Internal Medicine Contact information: [address] [place] [address] [phone]         Marcial Pacas, MD. Go in 6 month(s).   Specialty: Neurology Contact information: [place] [address] [phone]                Allergies  Allergen Reactions   Coconut Flavor Swelling   Tramadol Other (See Comments)    Possible seizures   Aleve [Naproxen] Other (See Comments)    Ulcers   Pineapple Other (See Comments)    Migraine    Consultations: Neurology   Procedures/Studies: EEG adult  Result Date: 08/13/2022 Lora Havens, MD     13-Aug-2022  4:59 PM [name] MRN: [id number] Epilepsy Attending: Lora Havens Referring Physician/Provider: Bernadette Hoit, DO Date: 08/13/22 Duration: 23.23 mins Patient history: 52yo F with h/o seizures now with dizziness. EEG to evaluate for seizure Level of alertness: Awake AEDs during EEG study: VPA Technical aspects: This EEG study was done with scalp electrodes positioned according to the 10-20 International system of electrode placement. Electrical activity was reviewed with band pass filter of 1-'70Hz'$ , sensitivity of 7 uV/mm, display speed of 53m/sec with  a '60Hz'$  notched filter applied as appropriate. EEG data were recorded continuously and digitally stored.  Video monitoring was available and reviewed as appropriate. Description: The posterior dominant rhythm consists of 9-10 Hz activity of moderate voltage (25-35 uV) seen predominantly in posterior head regions, symmetric and reactive to eye opening and eye closing.  Hyperventilation and photic stimulation were not performed.   IMPRESSION: This study is within normal limits. No seizures or epileptiform discharges were seen throughout the recording. A normal interictal EEG does not exclude the diagnosis of epilepsy. PLora Havens  CT ANGIO HEAD NECK W WO CM  Result Date: 120-Feb-2024CLINICAL DATA:  Headache, slurred speech, seizure EXAM: CT ANGIOGRAPHY HEAD AND NECK TECHNIQUE: Multidetector CT imaging of the head and neck was performed using the standard protocol during bolus administration of intravenous contrast. Multiplanar CT image reconstructions and MIPs were obtained to evaluate the vascular anatomy. Carotid stenosis measurements (when applicable) are obtained utilizing NASCET criteria, using the distal internal carotid diameter as the denominator. RADIATION DOSE REDUCTION: This exam was performed  according to the departmental dose-optimization program which includes automated exposure control, adjustment of the mA and/or kV according to patient size and/or use of iterative reconstruction technique. CONTRAST:  64m OMNIPAQUE IOHEXOL 350 MG/ML SOLN COMPARISON:  CT head 1 day prior FINDINGS: CT HEAD FINDINGS Brain: There is no acute intracranial hemorrhage, extra-axial fluid collection, or acute infarct Parenchymal volume is normal. The ventricles are normal in size. Gray-white differentiation is preserved. The pituitary and suprasellar region are normal. There is no mass lesion. There is no mass effect or midline shift. Vascular: See below. Skull: Normal. Negative for fracture or focal lesion.  Sinuses/Orbits: Is a sinuses are clear. The globes and orbits are unremarkable. Other: None. Review of the MIP images confirms the above findings CTA NECK FINDINGS Aortic arch: The imaged aortic arch is normal. The origins of the major branch vessels are patent. The subclavian arteries are patent to the level imaged. Right carotid system: The right common, internal, and external carotid arteries are patent, without hemodynamically significant stenosis or occlusion. There is no dissection or aneurysm. Left carotid system: The left common, internal, and external carotid arteries are patent, without hemodynamically significant stenosis or occlusion. There is no dissection or aneurysm. Vertebral arteries: The vertebral arteries are patent, without hemodynamically significant stenosis or occlusion. There is no dissection or aneurysm. Skeleton: There is no acute osseous abnormality or suspicious osseous lesion. There is no visible canal hematoma. Postsurgical changes reflecting C6-C7 ACDF are noted without evidence of complication. Other neck: The soft tissues are unremarkable. Upper chest: There is severe emphysema in the lung apices with biapical scarring. Review of the MIP images confirms the above findings CTA HEAD FINDINGS Anterior circulation: The intracranial ICAs are patent. The bilateral MCAs are patent, without proximal stenosis or occlusion. The bilateral ACAs are patent, without proximal stenosis or occlusion. The anterior communicating artery is normal. There is no aneurysm or AVM. Posterior circulation: The bilateral V4 segments are patent. The basilar artery is patent. The major cerebellar arteries appear patent. The bilateral PCAs are patent, without proximal stenosis or occlusion. Small bilateral posterior communicating arteries are identified. There is no aneurysm or AVM. Venous sinuses: Patent. Anatomic variants: None. Review of the MIP images confirms the above findings IMPRESSION: 1. Stable and normal  noncontrast head CT with no acute intracranial pathology. 2. Normal vasculature of the head and neck with no hemodynamically significant stenosis or occlusion. 3. Severe emphysema. Electronically Signed   By: PValetta MoleM.D.   On: 07/19/2022 14:54   DG Chest Port 1 View  Result Date: 07/19/2022 CLINICAL DATA:  weakness EXAM: PORTABLE CHEST 1 VIEW COMPARISON:  Chest x-ray 05/31/2019 FINDINGS: The heart and mediastinal contours are unchanged. Aortic calcification. No focal consolidation. No pulmonary edema. No pleural effusion. No pneumothorax. No acute osseous abnormality. Partially visualized thoracolumbar surgical hardware. IMPRESSION: 1. No active disease. 2.  Aortic Atherosclerosis (ICD10-I70.0). Electronically Signed   By: MIven FinnM.D.   On: 07/19/2022 00:12   CT Head Wo Contrast  Result Date: 07/19/2022 CLINICAL DATA:  Mental status change, unknown cause. Pt C/o dizziness, headache, slurred speech, confusion. Pt states they had a seizure last night. Pt is hypotensive. EXAM: CT HEAD WITHOUT CONTRAST TECHNIQUE: Contiguous axial images were obtained from the base of the skull through the vertex without intravenous contrast. RADIATION DOSE REDUCTION: This exam was performed according to the departmental dose-optimization program which includes automated exposure control, adjustment of the mA and/or kV according to patient size and/or use of iterative reconstruction technique.  COMPARISON:  CT head 05/07/2019 FINDINGS: Brain: No evidence of large-territorial acute infarction. No parenchymal hemorrhage. No mass lesion. No extra-axial collection. No mass effect or midline shift. No hydrocephalus. Basilar cisterns are patent. Vascular: No hyperdense vessel. Skull: No acute fracture or focal lesion. Sinuses/Orbits: Paranasal sinuses and mastoid air cells are clear. The orbits are unremarkable. Other: None. IMPRESSION: No acute intracranial abnormality. Electronically Signed   By: Iven Finn M.D.    On: 07/19/2022 00:05     Discharge Exam: Vitals:   07/20/22 0134 07/20/22 0515  BP: 115/72 128/73  Pulse: 81 80  Resp: (!) 21 17  Temp: 98.7 F (37.1 C) 98.1 F (36.7 C)  SpO2: 99% 100%   Vitals:   07/19/22 1803 07/19/22 2029 07/20/22 0134 07/20/22 0515  BP: 132/73 124/69 115/72 128/73  Pulse: 81 89 81 80  Resp: (!) 21 20 (!) 21 17  Temp: 98.8 F (37.1 C) 98.7 F (37.1 C) 98.7 F (37.1 C) 98.1 F (36.7 C)  TempSrc: Oral     SpO2: 100% 100% 99% 100%  Weight: 41.2 kg     Height: 5' (1.524 m)       General: Pt is alert, awake, not in acute distress Cardiovascular: RRR, S1/S2 +, no rubs, no gallops Respiratory: CTA bilaterally, no wheezing, no rhonchi Abdominal: Soft, NT, ND, bowel sounds + Extremities: no edema, no cyanosis    The results of significant diagnostics from this hospitalization (including imaging, microbiology, ancillary and laboratory) are listed below for reference.     Microbiology: Recent Results (from the past 240 hour(s))  Culture, blood (routine x 2)     Status: None (Preliminary result)   Collection Time: 07/18/22 11:25 PM   Specimen: Right Antecubital; Blood  Result Value Ref Range Status   Specimen Description RIGHT ANTECUBITAL  Final   Special Requests   Final    BOTTLES DRAWN AEROBIC AND ANAEROBIC Blood Culture adequate volume   Culture   Final    NO GROWTH 2 DAYS Performed at [facility], [address] [place], [place] [address]    Report Status PENDING  Incomplete  Culture, blood (routine x 2)     Status: None (Preliminary result)   Collection Time: 07/18/22 11:49 PM   Specimen: BLOOD LEFT ARM  Result Value Ref Range Status   Specimen Description BLOOD LEFT ARM  Final   Special Requests   Final    BOTTLES DRAWN AEROBIC AND ANAEROBIC Blood Culture adequate volume   Culture   Final    NO GROWTH 2 DAYS Performed at [facility], [address] [place], [place] [address]    Report Status PENDING  Incomplete  Urine Culture (for  pregnant, neutropenic or urologic patients or patients with an indwelling urinary catheter)     Status: None   Collection Time: 07/19/22  4:30 AM   Specimen: Urine, Clean Catch  Result Value Ref Range Status   Specimen Description   Final    URINE, CLEAN CATCH Performed at [facility], [address] [place], [place] [address]    Special Requests   Final    NONE Performed at [facility], [address] [place], [place] [address]    Culture   Final    NO GROWTH Performed at [facility], [place] [address], [place],  [address]    Report Status 07/20/2022 FINAL  Final     Labs: BNP (last 3 results) No results for input(s): "BNP" in the last 8760 hours. Basic  Metabolic Panel: Recent Labs  Lab 07/18/22 1456 07/18/22 2325 07/20/22 0328  NA 143 137 141  K 3.8 2.9* 3.5  CL 106 106 115*  CO2 20 20* 20*  GLUCOSE 73 121* 83  BUN '10 11 10  '$ CREATININE 0.95 1.00 0.63  CALCIUM 10.0 8.3* 7.8*  MG  --   --  1.8  PHOS  --   --  3.3   Liver Function Tests: Recent Labs  Lab 07/18/22 1456 07/18/22 2325 07/20/22 0328  AST 16 18 14*  ALT '5 8 8  '$ ALKPHOS 197* 139* 99  BILITOT 0.3 0.1* 0.1*  PROT 7.1 6.5 5.1*  ALBUMIN 4.9 3.8 2.9*   Recent Labs  Lab 07/18/22 2325  LIPASE 39   Recent Labs  Lab 07/19/22 1313  AMMONIA 50*   CBC: Recent Labs  Lab 07/18/22 2325 07/20/22 0328  WBC 8.4 4.7  NEUTROABS 5.1  --   HGB 12.9 10.4*  HCT 37.8 31.1*  MCV 90.9 91.5  PLT 255 172   Cardiac Enzymes: No results for input(s): "CKTOTAL", "CKMB", "CKMBINDEX", "TROPONINI" in the last 168 hours. BNP: Invalid input(s): "POCBNP" CBG: No results for input(s): "GLUCAP" in the last 168 hours. D-Dimer No results for input(s): "DDIMER" in the last 72 hours. Hgb A1c No results for input(s): "HGBA1C" in the last 72 hours. Lipid Profile No results for input(s): "CHOL", "HDL", "LDLCALC", "TRIG", "CHOLHDL", "LDLDIRECT" in the last 72 hours. Thyroid function studies Recent Labs     07/18/22 1456  TSH 0.454   Anemia work up Recent Labs    07/19/22 1718  VITAMINB12 287   Urinalysis    Component Value Date/Time   COLORURINE STRAW (A) 07/19/2022 0430   APPEARANCEUR CLEAR 07/19/2022 0430   LABSPEC 1.006 07/19/2022 0430   PHURINE 6.0 07/19/2022 0430   GLUCOSEU NEGATIVE 07/19/2022 0430   HGBUR NEGATIVE 07/19/2022 0430   BILIRUBINUR NEGATIVE 07/19/2022 0430   BILIRUBINUR negative 12/27/2020 1457   KETONESUR NEGATIVE 07/19/2022 0430   PROTEINUR NEGATIVE 07/19/2022 0430   UROBILINOGEN 0.2 12/27/2020 1457   UROBILINOGEN 0.2 06/03/2014 1952   NITRITE NEGATIVE 07/19/2022 0430   LEUKOCYTESUR NEGATIVE 07/19/2022 0430   Sepsis Labs Recent Labs  Lab 07/18/22 2325 07/20/22 0328  WBC 8.4 4.7   Microbiology Recent Results (from the past 240 hour(s))  Culture, blood (routine x 2)     Status: None (Preliminary result)   Collection Time: 07/18/22 11:25 PM   Specimen: Right Antecubital; Blood  Result Value Ref Range Status   Specimen Description RIGHT ANTECUBITAL  Final   Special Requests   Final    BOTTLES DRAWN AEROBIC AND ANAEROBIC Blood Culture adequate volume   Culture   Final    NO GROWTH 2 DAYS Performed at [facility], [address], [place], [place] [address]    Report Status PENDING  Incomplete  Culture, blood (routine x 2)     Status: None (Preliminary result)   Collection Time: 07/18/22 11:49 PM   Specimen: BLOOD LEFT ARM  Result Value Ref Range Status   Specimen Description BLOOD LEFT ARM  Final   Special Requests   Final    BOTTLES DRAWN AEROBIC AND ANAEROBIC Blood Culture adequate volume   Culture   Final    NO GROWTH 2 DAYS Performed at [facility], [address], [place], [place] [address]    Report Status PENDING  Incomplete  Urine Culture (for pregnant, neutropenic or urologic patients or patients with an indwelling urinary catheter)  Status: None   Collection Time: 07/19/22  4:30 AM   Specimen: Urine, Clean Catch  Result Value  Ref Range Status   Specimen Description   Final    URINE, CLEAN CATCH Performed at [facility], [address], [place], [place] [address]    Special Requests   Final    NONE Performed at [facility], [address], [place], [place] [address]    Culture   Final    NO GROWTH Performed at [facility], [place] [address], [place], [place] [address]    Report Status 07/20/2022 FINAL  Final     Time coordinating discharge: 35 minutes  SIGNED:   Rodena Goldmann, DO [facility] 07/20/2022, 9:53 AM  If 7PM-7AM, please contact night-coverage [email or web address]

## 2022-07-20 NOTE — Evaluation (Signed)
Physical Therapy Evaluation Patient Details Name: Sharon Welch MRN: [id number] DOB: [birth year] Today's Date: 07/20/2022  History of Present Illness  Sharon Welch is a 52 y.o. female with medical history significant of complex partial seizures, left temporoparietal lobe cavernoma, chronic migraine headaches, scoliosis, lumbar DDD who presents to the emergency department due to concern for possible stroke.  Patient states that she went to a neurologist yesterday (1/25) and her Topamax was changed to Depakote due to still having seizures despite being on Topamax.  Patient states that few hours after taking Depakote, daughter noted that she was confused and her speech was slurred and there was also concern for dizziness and headache.  Patient denies nausea, vomiting, diarrhea, fever, chills.    Clinical Impression  Patient is finishing up her breakfast tray on therapist arrival and is pleasant and agreeable to therapy assessment.  Patient performs supine to sit modified independent; taking extra time to perform.  Good sitting balance on EOB noted.  Patient performs sit to stand with therapist min guard and walks out into the hallway without AD with PT min guard assist for safety without any loss of balance or path deviation; noted decreased gait speed. Patient returns to bed with call button in reach and bed alarm set; nursing notified of above.   Patient does not need any further skilled PT services at this time.       Recommendations for follow up therapy are one component of a multi-disciplinary discharge planning process, led by the attending physician.  Recommendations may be updated based on patient status, additional functional criteria and insurance authorization.  Follow Up Recommendations No PT follow up      Assistance Recommended at Discharge PRN  Patient can return home with the following  Help with stairs or ramp for entrance;[name] help with walking and/or transfers    Equipment  Recommendations None recommended by PT  Recommendations for Other Services       Functional Status Assessment Patient has had a recent decline in their functional status and demonstrates the ability to make significant improvements in function in a reasonable and predictable amount of time.     Precautions / Restrictions Precautions Precautions: Fall Restrictions Weight Bearing Restrictions: No      Mobility  Bed Mobility Overal bed mobility: Modified Independent             General bed mobility comments: takes extra time to come to edge of bed Patient Response: Cooperative  Transfers Overall transfer level: Modified independent Equipment used: None               General transfer comment: sit to stand with PT SBA/Supervision for safety    Ambulation/Gait Ambulation/Gait assistance: Min guard Gait Distance (Feet): 100 Feet Assistive device: None         General Gait Details: decreased gait speed; no loss of balance or path deviation noted; slow turns  Financial trader Rankin (Stroke Patients Only)       Balance Overall balance assessment: Needs assistance Sitting-balance support: Feet unsupported Sitting balance-Leahy Scale: Normal Sitting balance - Comments: normal sitting balance on edge of  bed   Standing balance support: No upper extremity supported Standing balance-Leahy Scale: Good Standing balance comment: good standing balance; PT gives min guard assist for safety  Pertinent Vitals/Pain Pain Assessment Pain Assessment: 0-10 Pain Score: 7  Pain Location: states chronic pain in back and hips Pain Intervention(s): Monitored during session    [place] expects to be discharged to:: Private residence Living Arrangements: Spouse/significant other Available Help at Discharge: Family;Available 24 hours/day Type of Home: House Home Access: Stairs  to enter Entrance Stairs-Rails: Right;Can reach Software engineer of Steps: 2   Home Layout: One level Home Equipment: None Additional Comments: states her mother in law has equipment if she were to need it    Prior Function Prior Level of Function : Independent/Modified Independent                     Hand Dominance   Dominant Hand: Right    Extremity/Trunk Assessment   Upper Extremity Assessment Upper Extremity Assessment: Generalized weakness    Lower Extremity Assessment Lower Extremity Assessment: Generalized weakness    Cervical / Trunk Assessment Cervical / Trunk Assessment: Normal  Communication   Communication: No difficulties  Cognition Arousal/Alertness: Awake/alert Behavior During Therapy: WFL for tasks assessed/performed Overall Cognitive Status: Within Functional Limits for tasks assessed                                 General Comments: pleasant and cooperative with PT        General Comments      Exercises     Assessment/Plan    PT Assessment Patient does not need any further PT services  PT Problem List         PT Treatment Interventions      PT Goals (Current goals can be found in the Care Plan section)  Acute Rehab PT Goals Patient Stated Goal: return home PT Goal Formulation: With patient Time For Goal Achievement: 08/03/22 Potential to Achieve Goals: Good    Frequency       Co-evaluation               AM-PAC PT "6 Clicks" Mobility  Outcome Measure Help needed turning from your back to your side while in a flat bed without using bedrails?: None Help needed moving from lying on your back to sitting on the side of a flat bed without using bedrails?: None Help needed moving to and from a bed to a chair (including a wheelchair)?: [name] Help needed standing up from a chair using your arms (e.g., wheelchair or bedside chair)?: [name] Help needed to walk in hospital room?: A  Welch Help needed climbing 3-5 steps with a railing? : [name] 6 Click Score: 20    End of Session   Activity Tolerance: Patient tolerated treatment well Patient left: in bed;with call bell/phone within reach;with bed alarm set Nurse Communication: Mobility status PT Visit Diagnosis: Muscle weakness (generalized) (M62.81);Other abnormalities of gait and mobility (R26.89);Unsteadiness on feet (R26.81);History of falling (Z91.81)    Time: 3009-2330 PT Time Calculation (min) (ACUTE ONLY): 19 min   Charges:   PT Evaluation $PT Eval Low Complexity: 1 Low          9:09 AM, 07/20/22 Polo Mcmartin Small Sharon Welch MPT [facility] [place] [phone] [phone]

## 2022-07-22 LAB — MISC LABCORP TEST (SEND OUT): Labcorp test code: 81950

## 2022-07-23 LAB — CULTURE, BLOOD (ROUTINE X 2)
Culture: NO GROWTH
Culture: NO GROWTH
Special Requests: ADEQUATE
Special Requests: ADEQUATE

## 2022-08-01 ENCOUNTER — Ambulatory Visit
Admission: RE | Admit: 2022-08-01 | Discharge: 2022-08-01 | Disposition: A | Discharge status: Home / Self Care | Payer: 59 | Source: Ambulatory Visit | Attending: Neurology | Admitting: Neurology

## 2022-08-01 DIAGNOSIS — Q283 Other malformations of cerebral vessels: Secondary | ICD-10-CM | POA: Diagnosis not present

## 2022-08-01 DIAGNOSIS — G40909 Epilepsy, unspecified, not intractable, without status epilepticus: Secondary | ICD-10-CM | POA: Diagnosis not present

## 2022-08-01 DIAGNOSIS — G43709 Chronic migraine without aura, not intractable, without status migrainosus: Secondary | ICD-10-CM

## 2022-08-01 MED ORDER — GADOPICLENOL 0.5 MMOL/ML IV SOLN
5.0000 mL | Freq: Once | INTRAVENOUS | Status: AC | PRN
  Administered 2022-08-01: 5 mL via INTRAVENOUS

## 2022-08-05 ENCOUNTER — Other Ambulatory Visit: Payer: 59 | Admitting: *Deleted

## 2022-08-07 ENCOUNTER — Other Ambulatory Visit: Payer: 59 | Admitting: *Deleted

## 2022-08-07 ENCOUNTER — Ambulatory Visit (INDEPENDENT_AMBULATORY_CARE_PROVIDER_SITE_OTHER): Payer: 59 | Admitting: Neurology

## 2022-08-07 DIAGNOSIS — Q283 Other malformations of cerebral vessels: Secondary | ICD-10-CM

## 2022-08-07 DIAGNOSIS — G40909 Epilepsy, unspecified, not intractable, without status epilepticus: Secondary | ICD-10-CM

## 2022-08-07 DIAGNOSIS — G43709 Chronic migraine without aura, not intractable, without status migrainosus: Secondary | ICD-10-CM

## 2022-08-22 ENCOUNTER — Encounter: Payer: Self-pay | Admitting: Radiology

## 2022-08-28 NOTE — Procedures (Signed)
   HISTORY: 52 year old female with history of complex partial seizure  TECHNIQUE:  This is a routine 16 channel EEG recording with one channel devoted to a limited EKG recording.  It was performed during wakefulness, drowsiness and asleep.  Hyperventilation and photic stimulation were performed as activating procedures.  There are minimum muscle and movement artifact noted.  Upon maximum arousal, posterior dominant waking rhythm consistent of rhythmic alpha range activity. Activities are symmetric over the bilateral posterior derivations and attenuated with eye opening.  Hyperventilation produced mild/moderate buildup with higher amplitude and the slower activities noted.  Photic stimulation did not alter the tracing.  During EEG recording, patient developed drowsiness and no deeper stage of sleep was achieved  During EEG recording, there was no epileptiform discharge noted.  EKG demonstrate normal sinus rhythm.  CONCLUSION: This is a  normal awake EEG.  There is no electrodiagnostic evidence of epileptiform discharge.  Marcial Pacas, M.D. Ph.D.  [facility] [place], [place] [address] Phone: [phone] Fax:      [phone]

## 2022-09-11 ENCOUNTER — Encounter (INDEPENDENT_AMBULATORY_CARE_PROVIDER_SITE_OTHER): Payer: 59 | Admitting: Neurology

## 2022-09-11 DIAGNOSIS — G40909 Epilepsy, unspecified, not intractable, without status epilepticus: Secondary | ICD-10-CM | POA: Diagnosis not present

## 2022-09-11 DIAGNOSIS — G43709 Chronic migraine without aura, not intractable, without status migrainosus: Secondary | ICD-10-CM

## 2022-09-11 DIAGNOSIS — Q283 Other malformations of cerebral vessels: Secondary | ICD-10-CM

## 2022-09-20 ENCOUNTER — Encounter (HOSPITAL_COMMUNITY): Payer: Self-pay

## 2022-09-20 ENCOUNTER — Emergency Department (HOSPITAL_COMMUNITY)
Admission: EM | Admit: 2022-09-20 | Discharge: 2022-09-21 | Disposition: A | Discharge status: Home / Self Care | Payer: 59 | Attending: Emergency Medicine | Admitting: Emergency Medicine

## 2022-09-20 ENCOUNTER — Other Ambulatory Visit: Payer: Self-pay

## 2022-09-20 DIAGNOSIS — R4182 Altered mental status, unspecified: Secondary | ICD-10-CM | POA: Insufficient documentation

## 2022-09-20 DIAGNOSIS — E860 Dehydration: Secondary | ICD-10-CM | POA: Insufficient documentation

## 2022-09-20 DIAGNOSIS — F1110 Opioid abuse, uncomplicated: Secondary | ICD-10-CM | POA: Diagnosis not present

## 2022-09-20 LAB — COMPREHENSIVE METABOLIC PANEL
ALT: 5 U/L (ref 0–44)
AST: 12 U/L — ABNORMAL LOW (ref 15–41)
Albumin: 3 g/dL — ABNORMAL LOW (ref 3.5–5.0)
Alkaline Phosphatase: 68 U/L (ref 38–126)
Anion gap: 7 (ref 5–15)
BUN: 17 mg/dL (ref 6–20)
CO2: 24 mmol/L (ref 22–32)
Calcium: 7.6 mg/dL — ABNORMAL LOW (ref 8.9–10.3)
Chloride: 105 mmol/L (ref 98–111)
Creatinine, Ser: 0.93 mg/dL (ref 0.44–1.00)
GFR, Estimated: >60 mL/min (ref >60)
Glucose, Bld: 134 mg/dL — ABNORMAL HIGH (ref 70–99)
Potassium: 4.3 mmol/L (ref 3.5–5.1)
Sodium: 136 mmol/L (ref 135–145)
Total Bilirubin: 0.3 mg/dL (ref 0.3–1.2)
Total Protein: 5.5 g/dL — ABNORMAL LOW (ref 6.5–8.1)

## 2022-09-20 LAB — CBC
HCT: 35.6 % — ABNORMAL LOW (ref 36.0–46.0)
Hemoglobin: 11.4 g/dL — ABNORMAL LOW (ref 12.0–15.0)
MCH: 30.3 pg (ref 26.0–34.0)
MCHC: 32 g/dL (ref 30.0–36.0)
MCV: 94.7 fL (ref 80.0–100.0)
Platelets: 169 10*3/uL (ref 150–400)
RBC: 3.76 MIL/uL — ABNORMAL LOW (ref 3.87–5.11)
RDW: 12.7 % (ref 11.5–15.5)
WBC: 7 10*3/uL (ref 4.0–10.5)
nRBC: 0 % (ref 0.0–0.2)

## 2022-09-20 LAB — VALPROIC ACID LEVEL: Valproic Acid Lvl: 39 ug/mL — ABNORMAL LOW (ref 50.0–100.0)

## 2022-09-20 LAB — MAGNESIUM: Magnesium: 2 mg/dL (ref 1.7–2.4)

## 2022-09-20 NOTE — ED Triage Notes (Signed)
Pt states she has felt "off for a week" .  Having memory lapses.  Tremors.    Takes 4 hydrocodone a day  States husband thinks she taking more than that.  Pt states she has "these spells",.  States this has been going on for couple of days

## 2022-09-20 NOTE — ED Provider Notes (Signed)
[facility] Provider Note   CSN: [id number] Arrival date & time: 09/20/22  2158     History {Add pertinent medical, surgical, social history, OB history to HPI:1} Chief Complaint  Patient presents with   Altered Mental Status    Sharon Welch is a 52 y.o. female.  Patient reports that she has not been feeling well for a week. Gets episodes of felling shaky, like her body "feels a ton". Feels weak all over, especially both legs. Endorses occasional memory lapses.       Home Medications Prior to Admission medications   Medication Sig Start Date End Date Taking? Authorizing Provider  albuterol (VENTOLIN HFA) 108 (90 Base) MCG/ACT inhaler Inhale 1-2 puffs into the lungs every 4 (four) hours as needed for wheezing or shortness of breath.    [provider]  amitriptyline (ELAVIL) 25 MG tablet Take 50 mg by mouth at bedtime.    [provider]  dexlansoprazole (DEXILANT) 60 MG capsule Take 60 mg by mouth daily.    [provider]  diclofenac Sodium (VOLTAREN) 1 % GEL Apply 2 g topically daily as needed (Pain). 02/12/22   [provider]  divalproex (DEPAKOTE ER) 500 MG 24 hr tablet Take 2 tablets (1,000 mg total) by mouth at bedtime. 07/18/22   Marcial Pacas, MD  folic acid (FOLVITE) 1 MG tablet Take 1 mg by mouth daily. 04/15/22   [provider]  HYDROcodone-acetaminophen (NORCO) 10-325 MG tablet Take 1 tablet by mouth every 6 (six) hours as needed for moderate pain or severe pain. 07/15/22   [provider]  hydrOXYzine (ATARAX) 25 MG tablet Take 25 mg by mouth 4 (four) times daily as needed. 07/16/22   [provider]  linaclotide (LINZESS) 145 MCG CAPS capsule Take 145 mcg by mouth daily before breakfast.    [provider]  Milnacipran (SAVELLA) 50 MG TABS tablet Take 50 mg by mouth 2 (two) times daily.    [provider]  naloxone [facility]) nasal spray 4 mg/0.1 mL  Place 1 spray into the nose once. 01/16/22   [provider]  promethazine (PHENERGAN) 25 MG tablet Take 25 mg by mouth every 4 (four) hours as needed for nausea or vomiting.     [provider]  rizatriptan (MAXALT) 10 MG tablet Take 10 mg by mouth as needed for migraine. May repeat in 2 hours if needed    [provider]  tiZANidine (ZANAFLEX) 2 MG tablet Take 2 mg by mouth 2 (two) times daily.    [provider]      Allergies    Coconut flavor, Tramadol, Aleve [naproxen], and Pineapple    Review of Systems   Review of Systems  Physical Exam Updated Vital Signs BP (!) 96/51 (BP Location: Right Arm)   Pulse 66   Temp 97.8 F (36.6 C) (Oral)   Resp 20   Ht 5' (1.524 m)   Wt 45.4 kg   SpO2 100%   BMI 19.53 kg/m  Physical Exam Vitals and nursing note reviewed.  Constitutional:      General: She is not in acute distress.    Appearance: She is well-developed.  HENT:     Head: Normocephalic and atraumatic.     Mouth/Throat:     Mouth: Mucous membranes are moist.  Eyes:     General: Vision grossly intact. Gaze aligned appropriately.     Extraocular Movements: Extraocular movements intact.  Conjunctiva/sclera: Conjunctivae normal.  Cardiovascular:     Rate and Rhythm: Normal rate and regular rhythm.     Pulses: Normal pulses.     Heart sounds: Normal heart sounds, S1 normal and S2 normal. No murmur heard.    No friction rub. No gallop.  Pulmonary:     Effort: Pulmonary effort is normal. No respiratory distress.     Breath sounds: Normal breath sounds.  Abdominal:     General: Bowel sounds are normal.     Palpations: Abdomen is soft.     Tenderness: There is no abdominal tenderness. There is no guarding or rebound.     Hernia: No hernia is present.  Musculoskeletal:        General: No swelling.     Cervical back: Full passive range of motion without pain, normal range of motion and neck supple. No spinous process tenderness or  muscular tenderness. Normal range of motion.     Right lower leg: No edema.     Left lower leg: No edema.  Skin:    General: Skin is warm and dry.     Capillary Refill: Capillary refill takes less than 2 seconds.     Findings: No ecchymosis, erythema, rash or wound.  Neurological:     General: No focal deficit present.     Mental Status: She is alert and oriented to person, place, and time.     GCS: GCS eye subscore is 4. GCS verbal subscore is 5. GCS motor subscore is 6.     Cranial Nerves: Cranial nerves 2-12 are intact.     Sensory: Sensation is intact.     Motor: Motor function is intact.     Coordination: Coordination is intact.  Psychiatric:        Attention and Perception: Attention normal.        Mood and Affect: Mood normal.        Speech: Speech normal.        Behavior: Behavior normal.     ED Results / Procedures / Treatments   Labs (all labs ordered are listed, but only abnormal results are displayed) Labs Reviewed  COMPREHENSIVE METABOLIC PANEL  CBC    EKG None  Radiology No results found.  Procedures Procedures  {Document cardiac monitor, telemetry assessment procedure when appropriate:1}  Medications Ordered in ED Medications - No data to display  ED Course/ Medical Decision Making/ A&P   {   Click here for ABCD2, HEART and other calculatorsREFRESH Note before signing :1}                          Medical Decision Making Amount and/or Complexity of Data Reviewed Labs: ordered.   Differential diagnosis considered includes, but not limited to: TIA; Stroke; ICH; Seizure; electrolyte abnormality; toxic/pharmacologic causes; CNS infection; psychiatric disorder  Admitted in January for stroke like symptoms, determine to be seizure. On Depakote (switched from Topamax in Jan).  {Document critical care time when appropriate:1} {Document review of labs and clinical decision tools ie heart score, Chads2Vasc2 etc:1}  {Document your independent review of  radiology images, and any outside records:1} {Document your discussion with family members, caretakers, and with consultants:1} {Document social determinants of health affecting pt's care:1} {Document your decision making why or why not admission, treatments were needed:1} Final Clinical Impression(s) / ED Diagnoses Final diagnoses:  None    Rx / DC Orders ED Discharge Orders     None

## 2022-09-21 ENCOUNTER — Emergency Department (HOSPITAL_COMMUNITY): Payer: 59

## 2022-09-21 DIAGNOSIS — R4182 Altered mental status, unspecified: Secondary | ICD-10-CM | POA: Diagnosis not present

## 2022-09-21 LAB — URINALYSIS, ROUTINE W REFLEX MICROSCOPIC
Bilirubin Urine: NEGATIVE
Glucose, UA: NEGATIVE mg/dL
Hgb urine dipstick: NEGATIVE
Ketones, ur: NEGATIVE mg/dL
Leukocytes,Ua: NEGATIVE
Nitrite: NEGATIVE
Protein, ur: NEGATIVE mg/dL
Specific Gravity, Urine: 1.016 (ref 1.005–1.030)
pH: 5 (ref 5.0–8.0)

## 2022-09-21 LAB — RAPID URINE DRUG SCREEN, HOSP PERFORMED
Amphetamines: NOT DETECTED
Barbiturates: NOT DETECTED
Benzodiazepines: NOT DETECTED
Cocaine: NOT DETECTED
Opiates: POSITIVE — AB
Tetrahydrocannabinol: NOT DETECTED

## 2022-09-21 MED ORDER — SODIUM CHLORIDE 0.9 % IV BOLUS
1000.0000 mL | Freq: Once | INTRAVENOUS | Status: AC
  Administered 2022-09-21: 1000 mL via INTRAVENOUS

## 2022-09-21 NOTE — Discharge Instructions (Addendum)
Please schedule follow-up with your neurologist for any further testing.  For now, continue all your medications as prescribed.

## 2022-09-21 NOTE — ED Notes (Signed)
Placed patient in the bed pan to void. Patient is now resting in bed.

## 2022-09-21 NOTE — ED Notes (Signed)
With CT 

## 2022-09-21 NOTE — ED Notes (Addendum)
Pt ambulated to the restroom with no difficulty. Pt resting in bed at this time.

## 2022-09-21 NOTE — ED Notes (Signed)
Patient was placed on a purewick.  

## 2022-10-01 NOTE — Telephone Encounter (Addendum)
I called patient, emergency room presentation on September 20, 2022 for not feeling well, body shaky, weakness, patient reported documented hypotension, ER visit physical examination blood pressure was 100/56, heart rate of 59  She is tolerating Depakote ER 500 mg 2 tablets every night, sleep better, no recurrent seizure-like spell  Advised patient continue Depakote as current dose, Frequent small meals, increase water intake,  Please see the MyChart message reply(ies) for my assessment and plan.    This patient gave consent for this Medical Advice Message and is aware that it may result in a bill to [employer], as well as the possibility of receiving a bill for a co-payment or deductible. They are an established patient, but are not seeking medical advice exclusively about a problem treated during an in person or video visit in the last seven days. I did not recommend an in person or video visit within seven days of my reply.    I spent a total of 10 minutes cumulative time within 7 days through [employer].  Levert Feinstein, MD

## 2022-10-29 ENCOUNTER — Ambulatory Visit (INDEPENDENT_AMBULATORY_CARE_PROVIDER_SITE_OTHER): Payer: 59 | Admitting: Neurology

## 2022-10-29 ENCOUNTER — Encounter: Payer: Self-pay | Admitting: Neurology

## 2022-10-29 VITALS — BP 100/68 | Ht 60.0 in | Wt 97.0 lb

## 2022-10-29 DIAGNOSIS — G43709 Chronic migraine without aura, not intractable, without status migrainosus: Secondary | ICD-10-CM | POA: Diagnosis not present

## 2022-10-29 DIAGNOSIS — Q283 Other malformations of cerebral vessels: Secondary | ICD-10-CM

## 2022-10-29 DIAGNOSIS — G40909 Epilepsy, unspecified, not intractable, without status epilepticus: Secondary | ICD-10-CM | POA: Diagnosis not present

## 2022-10-29 MED ORDER — LAMOTRIGINE 100 MG PO TABS: 100.0000 mg | ORAL_TABLET | Freq: Two times a day (BID) | ORAL | 3 refills | Status: DC

## 2022-10-29 MED ORDER — LAMOTRIGINE 25 MG PO TABS: ORAL_TABLET | ORAL | 0 refills | Status: DC

## 2022-10-29 NOTE — Progress Notes (Signed)
Chief Complaint  Patient presents with   New Patient (Initial Visit)    Rm 15, with daughter [place], goes by [facility]", NP for shaky spells,generalized  weakness, muscle spasms and altered mental status. HX of seizures      ASSESSMENT AND PLAN  Sharon Welch is a 52 y.o. 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 Chronic migraine headaches   Recurrent confusion, staring spells, differentiation diagnosis include complex partial seizure versus opiates overuse, medication side effect  MRI of the brain showed stable left frontal cavernous angioma  EEG was normal, 72 hours EEG  Will add on lamotrigine, 100 mg twice a day, lower dose of Depakote to 500 mg every night even stop Depakote if she cannot tolerate combination therapy  Multiple electrolytes abnormality, low potassium, low albumin and total protein at recent emergency visit, can contributed to her complaints as well, repeat laboratory evaluations,  Return To Clinic With NP In    DIAGNOSTIC DATA (LABS, IMAGING, TESTING) - I reviewed patient records, labs, notes, testing and imaging myself where available. Personally reviewed MRI of the brain with and without contrast from 2012, left temporoparietal region chronic hemorrhage, likely related to chronic hemorrhage from cavernoma  MRI of the brain showed no significant abnormality  Laboratory evaluation in December 2023, CBC showed normal hemoglobin of 13.7, mild elevation of WBC 11, normal BMP  MEDICAL HISTORY:  Sharon Welch, is a 52 year old female, accompanied by her daughter seen in request by her primary care from [facility] medical doctor Elfredia Nevins, for evaluation of seizure, initial evaluation was on July 18, 2022  I reviewed and summarized the referring note. PMHX Chronic migraine,  GERD, history of gastric ulcer. Chronic insomnia Chronic constipation Fibromyogia Chronic migraine Cervical  Cancer, s/p hysterectomy Smoke   She was under the care of by Dr. Gerilyn Pilgrim, who has closed his practice, she was given the diagnosis of seizure, chronic migraine headaches  Patient reported history of migraine since 52 years old, lateralized severe pounding headache with light noise sensitivity  Following her gastric ulcer procedure in 2010, she also developed seizure, sometimes has aura of strange smell, followed by loss of consciousness, but sometimes she went into generalized tonic-clonic seizure  Last generalized seizure without warning signs was in December 2023, at nighttime she was sitting watching TV, woke up had a significant left side retro-orbital area headache, more intense than her usual migraine headaches, also has brain fog, confusion, lasting for few hours  Yesterday July 17, 2022, she also had an episode of sudden onset vertigo, confusion, time laps,  Daughter also reported she has zoning out spells, couple times each months, occasionally with finger fidgety, blinking  Personally reviewed MRI of the brain in 2012, evidence of left parietotemporal region cavernoma, hemorrhage, no mass effect,  She has been treated with Topamax 100 mg twice a day, worried about her weight loss, continue to have migraine headache once or twice each week,  Has been on disability for many years due to history of scoliosis, Harrington rod placement, quit driving around 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  Personally reviewed MRI of the brain with without contrast August 02, 2022, chronic chemo product in the patent  consistent with cerebral cavernous angioma in the posterior left subcortical frontal lobe, stable compared to previous MRI in November 2015.  Hospital admission from January 25 to 27, 2024, for confusion, slurred speech, dizziness, headaches, was diagnosed with idiopathic hypotension, history of peptic ulcer disease,  [place] presentation September 20, 2022, not feeling well, episode of feeling shaky, body heaviness, especially both legs   Laboratory evaluations in March 2024: Normal inactive magnesium, HIV, B12, CMP, showed low total protein and albumin, calcium was 7.6,  UDS was positive for opiates,  PHYSICAL EXAM:   Vitals:   10/29/22 0910  BP: 100/68  Weight: 97 lb (44 kg)  Height: 5' (1.524 m)     Body mass index is 18.94 kg/m.  PHYSICAL EXAMNIATION:  Gen: NAD, conversant, well nourised, well groomed                     Cardiovascular: Regular rate rhythm, no peripheral edema, warm, nontender. Eyes: Conjunctivae clear without exudates or hemorrhage Neck: Supple, no carotid bruits. Pulmonary: Clear to auscultation bilaterally   NEUROLOGICAL EXAM:  MENTAL STATUS: Speech/cognition:  Fragile, thin lady, awake, alert, oriented to history taking and casual conversation CRANIAL NERVES: CN II: Visual fields are full to confrontation. Pupils are round equal and briskly reactive to light. CN III, IV, VI: extraocular movement are normal. No ptosis. CN V: Facial sensation is intact to light touch CN VII: Face is symmetric with normal eye closure  CN VIII: Hearing is normal to causal conversation. CN IX, X: Phonation is normal. CN XI: Head turning and shoulder shrug are intact  MOTOR: mild hand action tremor, no weakness.  REFLEXES: Reflexes are 2 and symmetric at the biceps, triceps, knees, and ankles. Plantar responses are flexor.  SENSORY: Intact to light touch, pinprick and vibratory sensation are intact in fingers and toes.  COORDINATION: There is no trunk or limb dysmetria  noted.  GAIT/STANCE: Able to get up from seated position arm crossed, cautious gait  REVIEW OF SYSTEMS:  Full 14 system review of systems performed and notable only for as above All other review of systems were negative.   ALLERGIES: Allergies  Allergen Reactions   Coconut Flavor Swelling   Tramadol Other (See Comments)    Possible seizures   Aleve [Naproxen] Other (See Comments)    Ulcers   Pineapple Other (See Comments)    Migraine    HOME MEDICATIONS: Current Outpatient Medications  Medication Sig Dispense Refill   albuterol (VENTOLIN HFA) 108 (90 Base) MCG/ACT inhaler Inhale 1-2 puffs into the lungs every 4 (four) hours as needed for wheezing or shortness of breath.     amitriptyline (ELAVIL) 25 MG tablet Take 50 mg by mouth at bedtime.     dexlansoprazole (DEXILANT) 60 MG capsule Take 60 mg by mouth daily.     diclofenac Sodium (VOLTAREN) 1 % GEL Apply 2 g topically daily as needed (Pain).     divalproex (DEPAKOTE ER) 500 MG 24 hr tablet Take 2 tablets (1,000 mg total) by mouth at bedtime. 60 tablet 11   folic acid (FOLVITE) 1 MG tablet Take 1 mg by mouth daily.     HYDROcodone-acetaminophen (NORCO) 10-325 MG tablet Take 1 tablet by mouth every 6 (six) hours as needed for moderate pain or severe pain.     hydrOXYzine (ATARAX) 25 MG tablet Take 25 mg by  mouth 4 (four) times daily as needed.     linaclotide (LINZESS) 145 MCG CAPS capsule Take 145 mcg by mouth daily before breakfast.     Milnacipran (SAVELLA) 50 MG TABS tablet Take 50 mg by mouth 2 (two) times daily.     naloxone (NARCAN) nasal spray 4 mg/0.1 mL Place 1 spray into the nose once.     promethazine (PHENERGAN) 25 MG tablet Take 25 mg by mouth every 4 (four) hours as needed for nausea or vomiting.      rizatriptan (MAXALT) 10 MG tablet Take 10 mg by mouth as needed for migraine. May repeat in 2 hours if needed     tiZANidine (ZANAFLEX) 2 MG tablet Take 2 mg by mouth 2 (two) times daily.     No current  facility-administered medications for this visit.    PAST MEDICAL HISTORY: Past Medical History:  Diagnosis Date   Bruising, spontaneous 12/08/2013   Cancer (HCC)    cervical cancer - had hysterectomy   Chronic back pain    Chronic back pain    DDD (degenerative disc disease), lumbar    DDD (degenerative disc disease), lumbar    Fibromyalgia    Headache(784.0)    migraines   Ovarian cyst    Partial tear subscapularis tendon 12/27/2011   Scoliosis    Seizures (HCC)    started 9/12-    PAST SURGICAL HISTORY: Past Surgical History:  Procedure Laterality Date   ABDOMINAL HYSTERECTOMY  2012   BACK SURGERY  1989   scoliosis throsic-rods   CESAREAN SECTION     CHOLECYSTECTOMY N/A 11/17/2012   Procedure: LAPAROSCOPIC CHOLECYSTECTOMY WITH INTRAOPERATIVE CHOLANGIOGRAM;  Surgeon: Axel Filler, MD;  Location: [facility] OR;  Service: General;  Laterality: N/A;   COLONOSCOPY WITH PROPOFOL N/A 04/21/2020   Procedure: COLONOSCOPY WITH PROPOFOL;  Surgeon: Jeani Hawking, MD;  Location: [facility];  Service: Endoscopy;  Laterality: N/A;   ENDOSCOPIC RETROGRADE CHOLANGIOPANCREATOGRAPHY (ERCP) WITH PROPOFOL N/A 06/11/2019   Procedure: ENDOSCOPIC RETROGRADE CHOLANGIOPANCREATOGRAPHY (ERCP) WITH PROPOFOL;  Surgeon: Jeani Hawking, MD;  Location: [facility];  Service: Endoscopy;  Laterality: N/A;   ERCP N/A 05/07/2013   Procedure: ENDOSCOPIC RETROGRADE CHOLANGIOPANCREATOGRAPHY (ERCP);  Surgeon: Theda Belfast, MD;  Location: [facility];  Service: Endoscopy;  Laterality: N/A;  start ercp first, if canulation fails switch to EUS    ESOPHAGOGASTRODUODENOSCOPY (EGD) WITH PROPOFOL N/A 05/15/2019   Procedure: ESOPHAGOGASTRODUODENOSCOPY (EGD) WITH PROPOFOL;  Surgeon: West Bali, MD;  [facility]: [facility];  Service: Endoscopy;  Laterality: N/A;   ESOPHAGOGASTRODUODENOSCOPY (EGD) WITH PROPOFOL N/A 03/03/2020   Procedure: ESOPHAGOGASTRODUODENOSCOPY (EGD) WITH PROPOFOL;  Surgeon: Jeani Hawking, MD;   [facility]: [facility];  Service: Endoscopy;  Laterality: N/A;   ESOPHAGOGASTRODUODENOSCOPY (EGD) WITH PROPOFOL N/A 04/21/2020   Procedure: ESOPHAGOGASTRODUODENOSCOPY (EGD) WITH PROPOFOL;  Surgeon: Jeani Hawking, MD;  [facility]: [facility];  Service: Endoscopy;  Laterality: N/A;   EUS N/A 11/03/2012   Procedure: UPPER ENDOSCOPIC ULTRASOUND (EUS) LINEAR;  Surgeon: Theda Belfast, MD;  Location: [facility];  Service: Endoscopy;  Laterality: N/A;   left arm     NECK SURGERY     POLYPECTOMY  04/21/2020   Procedure: POLYPECTOMY;  Surgeon: Jeani Hawking, MD;  Location: [facility];  Service: Endoscopy;;   REMOVAL OF STONES  06/11/2019   Procedure: REMOVAL OF STONES;  Surgeon: Jeani Hawking, MD;  Location: [facility];  Service: Endoscopy;;   TOOTH EXTRACTION N/A 05/24/2022   Procedure: DENTAL RESTORATION/EXTRACTIONS;  Surgeon: Ocie Doyne, DMD;  Location: [facility];  Service: Oral Surgery;  Laterality: N/A;   TUBAL LIGATION     UPPER ESOPHAGEAL ENDOSCOPIC ULTRASOUND (EUS) N/A 06/11/2019   Procedure: UPPER ESOPHAGEAL ENDOSCOPIC ULTRASOUND (EUS);  Surgeon: Jeani Hawking, MD;  Location: [facility];  Service: Endoscopy;  Laterality: N/A;    FAMILY HISTORY: Family History  Problem Relation Age of Onset   Hypertension Mother    Hypertension Father    Cancer Father        prostate   Heart disease Father    Hypertension Brother    Hypertension Brother     SOCIAL HISTORY: Social History   Socioeconomic History   Marital status: Married    Spouse name: Not on file   Number of children: Not on file   Years of education: Not on file   Highest education level: Not on file  Occupational History   Not on file  Tobacco Use   Smoking status: Every Day    Packs/day: 0.50    Years: 20.00    Additional pack years: 0.00    Total pack years: 10.00    Types: Cigarettes   Smokeless tobacco: Never  Vaping Use   Vaping Use: Never used  Substance and Sexual Activity   Alcohol use: Not Currently    Drug use: No   Sexual activity: Not on file  Other Topics Concern   Not on file  Social History Narrative   Live with husband 2 dogs and 4 cats   Right handed   Caffeine- 24oz-36oz daily   Social Determinants of Health   Financial Resource Strain: Not on file  Food Insecurity: No Food Insecurity (07/19/2022)   Hunger Vital Sign    Worried About Running Out of Food in the Last Year: Never true    Ran Out of Food in the Last Year: Never true  Transportation Needs: No Transportation Needs (07/19/2022)   PRAPARE - Administrator, Civil Service (Medical): No    Lack of Transportation (Non-Medical): No  Physical Activity: Not on file  Stress: Not on file  Social Connections: Not on file  Intimate Partner Violence: Not At Risk (07/19/2022)   Humiliation, Afraid, Rape, and Kick questionnaire    Fear of Current or Ex-Partner: No    Emotionally Abused: No    Physically Abused: No    Sexually Abused: No      Levert Feinstein, M.D. Ph.D.  [facility] [address] [place], [place] [address] Ph: [phone] Fax: [phone]  CC:  Gilda Crease, MD [address] [place],  [place] [address]  Elfredia Nevins, MD

## 2022-10-29 NOTE — Patient Instructions (Addendum)
Weeks AM/PM Depakote ER 500mg   Lamotrigine 25mg   1st 0/2 1/1  2nd 0/2 2/2  3rd 0/1 3/3  4th 0/1 4/4  5th 0/0 Lamotrigine 100mg  twice a day    If you have dizziness, sleepiness, shaky, please call.

## 2022-10-30 LAB — COMPREHENSIVE METABOLIC PANEL
ALT: 7 IU/L (ref 0–32)
AST: 15 IU/L (ref 0–40)
Albumin/Globulin Ratio: 1.8 (ref 1.2–2.2)
Albumin: 4 g/dL (ref 3.8–4.9)
Alkaline Phosphatase: 129 IU/L — ABNORMAL HIGH (ref 44–121)
BUN/Creatinine Ratio: 16 (ref 9–23)
BUN: 15 mg/dL (ref 6–24)
Bilirubin Total: 0.2 mg/dL (ref 0.0–1.2)
CO2: 23 mmol/L (ref 20–29)
Calcium: 9.3 mg/dL (ref 8.7–10.2)
Chloride: 102 mmol/L (ref 96–106)
Creatinine, Ser: 0.91 mg/dL (ref 0.57–1.00)
Globulin, Total: 2.2 g/dL (ref 1.5–4.5)
Glucose: 71 mg/dL (ref 70–99)
Potassium: 3.6 mmol/L (ref 3.5–5.2)
Sodium: 142 mmol/L (ref 134–144)
Total Protein: 6.2 g/dL (ref 6.0–8.5)
eGFR: 75 mL/min/{1.73_m2} (ref >59)

## 2022-10-30 LAB — CBC WITH DIFFERENTIAL
Basophils Absolute: 0.1 10*3/uL (ref 0.0–0.2)
Basos: 1 %
EOS (ABSOLUTE): 0.1 10*3/uL (ref 0.0–0.4)
Eos: 1 %
Hematocrit: 40 % (ref 34.0–46.6)
Hemoglobin: 13.3 g/dL (ref 11.1–15.9)
Immature Grans (Abs): 0 10*3/uL (ref 0.0–0.1)
Immature Granulocytes: 0 %
Lymphocytes Absolute: 1.4 10*3/uL (ref 0.7–3.1)
Lymphs: 28 %
MCH: 30.7 pg (ref 26.6–33.0)
MCHC: 33.3 g/dL (ref 31.5–35.7)
MCV: 92 fL (ref 79–97)
Monocytes Absolute: 0.9 10*3/uL (ref 0.1–0.9)
Monocytes: 17 %
Neutrophils Absolute: 2.8 10*3/uL (ref 1.4–7.0)
Neutrophils: 53 %
RBC: 4.33 x10E6/uL (ref 3.77–5.28)
RDW: 14.6 % (ref 11.7–15.4)
WBC: 5.2 10*3/uL (ref 3.4–10.8)

## 2022-10-30 LAB — CK: Total CK: 36 U/L (ref 32–182)

## 2022-10-30 LAB — VALPROIC ACID LEVEL: Valproic Acid Lvl: 80 ug/mL (ref 50–100)

## 2022-10-30 LAB — TSH: TSH: 2.05 u[IU]/mL (ref 0.450–4.500)

## 2022-11-06 ENCOUNTER — Other Ambulatory Visit (HOSPITAL_COMMUNITY): Payer: Self-pay | Admitting: Internal Medicine

## 2022-11-06 ENCOUNTER — Ambulatory Visit (HOSPITAL_COMMUNITY)
Admission: RE | Admit: 2022-11-06 | Discharge: 2022-11-06 | Disposition: A | Discharge status: Home / Self Care | Payer: 59 | Source: Ambulatory Visit | Attending: Internal Medicine | Admitting: Internal Medicine

## 2022-11-06 DIAGNOSIS — M25552 Pain in left hip: Secondary | ICD-10-CM | POA: Diagnosis present

## 2022-11-08 ENCOUNTER — Emergency Department (HOSPITAL_COMMUNITY): Payer: 59

## 2022-11-08 ENCOUNTER — Emergency Department (HOSPITAL_COMMUNITY)
Admission: EM | Admit: 2022-11-08 | Discharge: 2022-11-08 | Disposition: A | Discharge status: Home / Self Care | Payer: 59 | Attending: Emergency Medicine | Admitting: Emergency Medicine

## 2022-11-08 ENCOUNTER — Encounter (HOSPITAL_COMMUNITY): Payer: Self-pay | Admitting: Emergency Medicine

## 2022-11-08 ENCOUNTER — Other Ambulatory Visit: Payer: Self-pay

## 2022-11-08 DIAGNOSIS — G8929 Other chronic pain: Secondary | ICD-10-CM | POA: Insufficient documentation

## 2022-11-08 DIAGNOSIS — M419 Scoliosis, unspecified: Secondary | ICD-10-CM | POA: Diagnosis not present

## 2022-11-08 DIAGNOSIS — W19XXXA Unspecified fall, initial encounter: Secondary | ICD-10-CM

## 2022-11-08 DIAGNOSIS — M25552 Pain in left hip: Secondary | ICD-10-CM | POA: Diagnosis not present

## 2022-11-08 DIAGNOSIS — W108XXA Fall (on) (from) other stairs and steps, initial encounter: Secondary | ICD-10-CM | POA: Diagnosis not present

## 2022-11-08 DIAGNOSIS — M797 Fibromyalgia: Secondary | ICD-10-CM | POA: Insufficient documentation

## 2022-11-08 DIAGNOSIS — R0789 Other chest pain: Secondary | ICD-10-CM | POA: Diagnosis present

## 2022-11-08 DIAGNOSIS — S20312A Abrasion of left front wall of thorax, initial encounter: Secondary | ICD-10-CM | POA: Diagnosis not present

## 2022-11-08 DIAGNOSIS — S0990XA Unspecified injury of head, initial encounter: Secondary | ICD-10-CM | POA: Diagnosis not present

## 2022-11-08 LAB — CBC WITH DIFFERENTIAL/PLATELET
Abs Immature Granulocytes: 0.05 10*3/uL (ref 0.00–0.07)
Basophils Absolute: 0.1 10*3/uL (ref 0.0–0.1)
Basophils Relative: 1 %
Eosinophils Absolute: 0.1 10*3/uL (ref 0.0–0.5)
Eosinophils Relative: 1 %
HCT: 31.5 % — ABNORMAL LOW (ref 36.0–46.0)
Hemoglobin: 10.4 g/dL — ABNORMAL LOW (ref 12.0–15.0)
Immature Granulocytes: 1 %
Lymphocytes Relative: 17 %
Lymphs Abs: 1.1 10*3/uL (ref 0.7–4.0)
MCH: 31.5 pg (ref 26.0–34.0)
MCHC: 33 g/dL (ref 30.0–36.0)
MCV: 95.5 fL (ref 80.0–100.0)
Monocytes Absolute: 0.9 10*3/uL (ref 0.1–1.0)
Monocytes Relative: 13 %
Neutro Abs: 4.6 10*3/uL (ref 1.7–7.7)
Neutrophils Relative %: 67 %
Platelets: 132 10*3/uL — ABNORMAL LOW (ref 150–400)
RBC: 3.3 MIL/uL — ABNORMAL LOW (ref 3.87–5.11)
RDW: 14.3 % (ref 11.5–15.5)
WBC: 6.7 10*3/uL (ref 4.0–10.5)
nRBC: 0 % (ref 0.0–0.2)

## 2022-11-08 LAB — BASIC METABOLIC PANEL
Anion gap: 7 (ref 5–15)
BUN: 14 mg/dL (ref 6–20)
CO2: 28 mmol/L (ref 22–32)
Calcium: 7.8 mg/dL — ABNORMAL LOW (ref 8.9–10.3)
Chloride: 102 mmol/L (ref 98–111)
Creatinine, Ser: 0.83 mg/dL (ref 0.44–1.00)
GFR, Estimated: >60 mL/min (ref >60)
Glucose, Bld: 78 mg/dL (ref 70–99)
Potassium: 3.8 mmol/L (ref 3.5–5.1)
Sodium: 137 mmol/L (ref 135–145)

## 2022-11-08 MED ORDER — HYDROCODONE-ACETAMINOPHEN 5-325 MG PO TABS
2.0000 | ORAL_TABLET | Freq: Once | ORAL | Status: AC
  Administered 2022-11-08: 2 via ORAL
  Filled 2022-11-08: qty 2

## 2022-11-08 MED ORDER — HYDROCODONE-ACETAMINOPHEN 5-325 MG PO TABS: 1.0000 | ORAL_TABLET | Freq: Once | ORAL | Status: DC

## 2022-11-08 NOTE — ED Triage Notes (Signed)
Pt mis stepped coming off platform, and fell on her left side, injuring lt hip and hitting head, unsure if she loss consciousness, B/P low in triage.

## 2022-11-08 NOTE — ED Provider Notes (Signed)
EMERGENCY DEPARTMENT AT [facility] Provider Note   CSN: [id number] Arrival date & time: 11/08/22  1629     History  Chief Complaint  Patient presents with   Fall   HPI Sharon Welch is a 52 y.o. female with scoliosis, fibromyalgia, chronic back pain presenting for fall.  Occurred 2 days ago.  Patient was walking down the steps when she slipped and fell down 12 stairs.  States she landed on her left side of her left hip and hit her head.  Patient cannot remember if she was unconscious.  Now endorsing left hip pain and left lateral chest wall pain.  States her head and neck do not hurt.  States she is able to ambulate and bear weight but pain in the left hip is persistent.  It feels sharp and at times radiates down her left leg.   Fall       Home Medications Prior to Admission medications   Medication Sig Start Date End Date Taking? Authorizing Provider  albuterol (VENTOLIN HFA) 108 (90 Base) MCG/ACT inhaler Inhale 1-2 puffs into the lungs every 4 (four) hours as needed for wheezing or shortness of breath.    [provider]  amitriptyline (ELAVIL) 25 MG tablet Take 50 mg by mouth at bedtime.    [provider]  dexlansoprazole (DEXILANT) 60 MG capsule Take 60 mg by mouth daily.    [provider]  diclofenac Sodium (VOLTAREN) 1 % GEL Apply 2 g topically daily as needed (Pain). 02/12/22   [provider]  divalproex (DEPAKOTE ER) 500 MG 24 hr tablet Take 2 tablets (1,000 mg total) by mouth at bedtime. 07/18/22   Levert Feinstein, MD  folic acid (FOLVITE) 1 MG tablet Take 1 mg by mouth daily. 04/15/22   [provider]  HYDROcodone-acetaminophen (NORCO) 10-325 MG tablet Take 1 tablet by mouth every 6 (six) hours as needed for moderate pain or severe pain. 07/15/22   [provider]  hydrOXYzine (ATARAX) 25 MG tablet Take 25 mg by mouth 4 (four) times daily as needed. 07/16/22   [provider]  lamoTRIgine  (LAMICTAL) 100 MG tablet Take 1 tablet (100 mg total) by mouth 2 (two) times daily. 10/29/22   Levert Feinstein, MD  lamoTRIgine (LAMICTAL) 25 MG tablet 1 twice a day x1wk 2 twice a day x 2nd wk 3 twice a day x3rd wk 10/29/22   Levert Feinstein, MD  linaclotide [facility]) 145 MCG CAPS capsule Take 145 mcg by mouth daily before breakfast.    [provider]  Milnacipran (SAVELLA) 50 MG TABS tablet Take 50 mg by mouth 2 (two) times daily.    [provider]  naloxone [facility]) nasal spray 4 mg/0.1 mL Place 1 spray into the nose once. 01/16/22   [provider]  promethazine (PHENERGAN) 25 MG tablet Take 25 mg by mouth every 4 (four) hours as needed for nausea or vomiting.     [provider]  rizatriptan (MAXALT) 10 MG tablet Take 10 mg by mouth as needed for migraine. May repeat in 2 hours if needed    [provider]  tiZANidine (ZANAFLEX) 2 MG tablet Take 2 mg by mouth 2 (two) times daily.    [provider]      Allergies    Coconut flavor, Tramadol, Aleve [naproxen], and Pineapple    Review of Systems   See HPI for pertinent positives   Physical Exam   Vitals:   11/08/22 1650  11/08/22 1715  BP: (!) 92/59 93/68  Pulse: 81 71  Resp: 18 15  Temp: 98.5 F (36.9 C)   SpO2: 96% 96%    CONSTITUTIONAL:  well-appearing, NAD NEURO: GCS 15. Speech is goal oriented. No deficits appreciated to CN III-XII; symmetric eyebrow raise, no facial drooping, tongue midline. Patient has equal grip strength bilaterally with 5/5 strength against resistance in all major muscle groups bilaterally. Sensation to light touch intact. Patient moves extremities without ataxia. Normal finger-nose-finger. Patient ambulatory with steady gait. EYES:  eyes equal and reactive ENT/NECK:  Supple, no stridor  Chest: Chest wall appears atraumatic, no obvious deformity, step-off, abrasion or ecchymosis noted in the left lateral region. CARDIO:  Regular  rate and rhythm, appears  well-perfused  PULM:  No respiratory distress, CTAB GI/GU:  non-distended, soft, non tender MSK/SPINE:  No gross deformities, no edema, moves all extremities.  Able to ambulate and bear weight on the left hip.  Range of motion appears normal against active and passive resistance.  Pedal pulses are 2+ bilaterally.  No observable ecchymosis, obvious deformity or edema in the left hip. SKIN:  no rash, atraumatic  *Additional and/or pertinent findings included in MDM below  ED Results / Procedures / Treatments   Labs (all labs ordered are listed, but only abnormal results are displayed) Labs Reviewed  CBC WITH DIFFERENTIAL/PLATELET - Abnormal; Notable for the following components:      Result Value   RBC 3.30 (*)    Hemoglobin 10.4 (*)    HCT 31.5 (*)    Platelets 132 (*)    All other components within normal limits  BASIC METABOLIC PANEL - Abnormal; Notable for the following components:   Calcium 7.8 (*)    All other components within normal limits    EKG None  Radiology DG Chest Portable 1 View  Result Date: 11/08/2022 CLINICAL DATA:  Fall EXAM: PORTABLE CHEST 1 VIEW COMPARISON:  07/18/2022 FINDINGS: The heart size and mediastinal contours are within normal limits. No focal airspace consolidation, pleural effusion, or pneumothorax. Bones appear demineralized. Partially imaged thoracolumbar fusion hardware as well as lower cervical ACDF hardware. IMPRESSION: No active disease. Electronically Signed   By: Duanne Guess D.O.   On: 11/08/2022 18:07   CT Cervical Spine Wo Contrast  Result Date: 11/08/2022 CLINICAL DATA:  Fall.  Trauma to head. EXAM: CT CERVICAL SPINE WITHOUT CONTRAST TECHNIQUE: Multidetector CT imaging of the cervical spine was performed without intravenous contrast. Multiplanar CT image reconstructions were also generated. RADIATION DOSE REDUCTION: This exam was performed according to the departmental dose-optimization program which includes automated exposure control,  adjustment of the mA and/or kV according to patient size and/or use of iterative reconstruction technique. COMPARISON:  CT of the cervical spine 09/17/2018 FINDINGS: Alignment: No significant listhesis is present. Cervical lordosis is preserved. Skull base and vertebrae: The craniocervical junction is normal. Vertebral body heights are normal. No acute fractures are present. Soft tissues and spinal canal: No prevertebral fluid or swelling. No visible canal hematoma. Disc levels: Solid anterior fusion is present at C6-7. No focal stenosis is present otherwise. Upper chest: Centrilobular emphysematous changes are present. Scarring is present at the lung apices without significant interval change. IMPRESSION: 1. No acute fracture or traumatic subluxation. 2. Solid anterior fusion at C6-7. 3.  Emphysema (ICD10-J43.9). Electronically Signed   By: Marin Roberts M.D.   On: 11/08/2022 17:48   CT Head Wo Contrast  Result Date: 11/08/2022 CLINICAL DATA:  Fall. Trauma to head. Possible loss of  consciousness. EXAM: CT HEAD WITHOUT CONTRAST TECHNIQUE: Contiguous axial images were obtained from the base of the skull through the vertex without intravenous contrast. RADIATION DOSE REDUCTION: This exam was performed according to the departmental dose-optimization program which includes automated exposure control, adjustment of the mA and/or kV according to patient size and/or use of iterative reconstruction technique. COMPARISON:  CT head without contrast 09/21/2022. MR head without and with contrast 08/01/2022. FINDINGS: Brain: No acute infarct, hemorrhage, or mass lesion is present. No significant white matter lesions are present. Deep brain nuclei are within normal limits. The ventricles are of normal size. No significant extraaxial fluid collection is present. Vascular: No hyperdense vessel or unexpected calcification. Skull: No significant extracranial soft tissue lesion is present. Calvarium is intact. No focal  lytic or blastic lesions are present. Sinuses/Orbits: The paranasal sinuses and mastoid air cells are clear. The globes and orbits are within normal limits. IMPRESSION: Negative CT of the head. Electronically Signed   By: Marin Roberts M.D.   On: 11/08/2022 17:45   DG HIP UNILAT WITH PELVIS 2-3 VIEWS LEFT  Result Date: 11/08/2022 CLINICAL DATA:  Fall left hip pain EXAM: DG HIP (WITH OR WITHOUT PELVIS) 2-3V LEFT COMPARISON:  None Available. FINDINGS: Partially visualized lumbar spine hardware. SI joints are non widened. Pubic symphysis and rami appear intact. No fracture or malalignment IMPRESSION: No acute osseous abnormality. Electronically Signed   By: Jasmine Pang M.D.   On: 11/08/2022 17:24    Procedures Procedures    Medications Ordered in ED Medications  HYDROcodone-acetaminophen (NORCO/VICODIN) 5-325 MG per tablet 2 tablet (2 tablets Oral Given 11/08/22 1803)    ED Course/ Medical Decision Making/ A&P Clinical Course as of 11/08/22 1852  Fri Nov 08, 2022  1730 CT Head Wo Contrast [JR]    Clinical Course User Index [JR] Gareth Eagle, PA-C                             Medical Decision Making Amount and/or Complexity of Data Reviewed Labs: ordered. Radiology: ordered. Decision-making details documented in ED Course.  52 year old well-appearing female presenting for fall. Exam was unremarkable. DDx includes hip fracture, dislocation, traumatic back injury, traumatic head or neck injury, rib fracture and pneumothorax.  Scans and x-rays were negative fortunately.  Patient overall appears clinically well able to ambulate and bear weight.  Treated pain with Norco.  Advised her to follow-up with her PCP.  Vitals remained stable.  Discharged home.  Discussed pertinent return precautions.         Final Clinical Impression(s) / ED Diagnoses Final diagnoses:  Fall, initial encounter  Pain of left hip    Rx / DC Orders ED Discharge Orders     None          Gareth Eagle, PA-C 11/08/22 1852    Mardene Sayer, MD 11/08/22 2326

## 2022-11-08 NOTE — Discharge Instructions (Addendum)
Evaluation for your fall was overall reassuring. Recommend that you follow-up with your PCP.  Your scans were all negative for acute injury.  Can take Tylenol and ibuprofen at home for pain and swelling.

## 2022-11-15 ENCOUNTER — Emergency Department (HOSPITAL_COMMUNITY): Payer: 59

## 2022-11-15 ENCOUNTER — Inpatient Hospital Stay (HOSPITAL_COMMUNITY)
Admission: EM | Admit: 2022-11-15 | Discharge: 2022-11-19 | DRG: 522 | Disposition: A | Discharge status: Home Health | Payer: 59 | Attending: Internal Medicine | Admitting: Internal Medicine

## 2022-11-15 ENCOUNTER — Other Ambulatory Visit: Payer: Self-pay

## 2022-11-15 ENCOUNTER — Encounter (HOSPITAL_COMMUNITY): Payer: Self-pay | Admitting: Emergency Medicine

## 2022-11-15 DIAGNOSIS — M542 Cervicalgia: Secondary | ICD-10-CM

## 2022-11-15 DIAGNOSIS — G40909 Epilepsy, unspecified, not intractable, without status epilepticus: Secondary | ICD-10-CM

## 2022-11-15 DIAGNOSIS — F17210 Nicotine dependence, cigarettes, uncomplicated: Secondary | ICD-10-CM | POA: Diagnosis present

## 2022-11-15 DIAGNOSIS — Z9181 History of falling: Secondary | ICD-10-CM

## 2022-11-15 DIAGNOSIS — Z9049 Acquired absence of other specified parts of digestive tract: Secondary | ICD-10-CM

## 2022-11-15 DIAGNOSIS — Z9851 Tubal ligation status: Secondary | ICD-10-CM

## 2022-11-15 DIAGNOSIS — Y9301 Activity, walking, marching and hiking: Secondary | ICD-10-CM | POA: Diagnosis present

## 2022-11-15 DIAGNOSIS — Y92008 Other place in unspecified non-institutional (private) residence as the place of occurrence of the external cause: Secondary | ICD-10-CM

## 2022-11-15 DIAGNOSIS — M549 Dorsalgia, unspecified: Secondary | ICD-10-CM | POA: Diagnosis present

## 2022-11-15 DIAGNOSIS — S72002P Fracture of unspecified part of neck of left femur, subsequent encounter for closed fracture with malunion: Secondary | ICD-10-CM | POA: Diagnosis not present

## 2022-11-15 DIAGNOSIS — Z886 Allergy status to analgesic agent status: Secondary | ICD-10-CM | POA: Diagnosis not present

## 2022-11-15 DIAGNOSIS — Z90710 Acquired absence of both cervix and uterus: Secondary | ICD-10-CM

## 2022-11-15 DIAGNOSIS — G8929 Other chronic pain: Secondary | ICD-10-CM | POA: Diagnosis present

## 2022-11-15 DIAGNOSIS — W1830XA Fall on same level, unspecified, initial encounter: Secondary | ICD-10-CM | POA: Diagnosis present

## 2022-11-15 DIAGNOSIS — G43909 Migraine, unspecified, not intractable, without status migrainosus: Secondary | ICD-10-CM | POA: Diagnosis present

## 2022-11-15 DIAGNOSIS — M797 Fibromyalgia: Secondary | ICD-10-CM | POA: Diagnosis present

## 2022-11-15 DIAGNOSIS — R569 Unspecified convulsions: Secondary | ICD-10-CM | POA: Diagnosis not present

## 2022-11-15 DIAGNOSIS — Z720 Tobacco use: Secondary | ICD-10-CM | POA: Insufficient documentation

## 2022-11-15 DIAGNOSIS — S72002A Fracture of unspecified part of neck of left femur, initial encounter for closed fracture: Secondary | ICD-10-CM | POA: Diagnosis present

## 2022-11-15 DIAGNOSIS — E876 Hypokalemia: Secondary | ICD-10-CM | POA: Diagnosis present

## 2022-11-15 DIAGNOSIS — W19XXXA Unspecified fall, initial encounter: Secondary | ICD-10-CM

## 2022-11-15 DIAGNOSIS — M25552 Pain in left hip: Secondary | ICD-10-CM | POA: Diagnosis present

## 2022-11-15 DIAGNOSIS — Z79899 Other long term (current) drug therapy: Secondary | ICD-10-CM

## 2022-11-15 DIAGNOSIS — Z8541 Personal history of malignant neoplasm of cervix uteri: Secondary | ICD-10-CM | POA: Diagnosis not present

## 2022-11-15 DIAGNOSIS — K838 Other specified diseases of biliary tract: Secondary | ICD-10-CM | POA: Diagnosis present

## 2022-11-15 DIAGNOSIS — K219 Gastro-esophageal reflux disease without esophagitis: Secondary | ICD-10-CM | POA: Diagnosis present

## 2022-11-15 DIAGNOSIS — Z8249 Family history of ischemic heart disease and other diseases of the circulatory system: Secondary | ICD-10-CM

## 2022-11-15 DIAGNOSIS — Z91018 Allergy to other foods: Secondary | ICD-10-CM

## 2022-11-15 DIAGNOSIS — G709 Myoneural disorder, unspecified: Secondary | ICD-10-CM | POA: Diagnosis not present

## 2022-11-15 DIAGNOSIS — Z885 Allergy status to narcotic agent status: Secondary | ICD-10-CM | POA: Diagnosis not present

## 2022-11-15 DIAGNOSIS — F419 Anxiety disorder, unspecified: Secondary | ICD-10-CM | POA: Diagnosis present

## 2022-11-15 DIAGNOSIS — R519 Headache, unspecified: Secondary | ICD-10-CM

## 2022-11-15 DIAGNOSIS — R7401 Elevation of levels of liver transaminase levels: Secondary | ICD-10-CM

## 2022-11-15 DIAGNOSIS — S72009A Fracture of unspecified part of neck of unspecified femur, initial encounter for closed fracture: Secondary | ICD-10-CM | POA: Diagnosis present

## 2022-11-15 DIAGNOSIS — R410 Disorientation, unspecified: Secondary | ICD-10-CM | POA: Diagnosis not present

## 2022-11-15 LAB — BASIC METABOLIC PANEL
Anion gap: 19 — ABNORMAL HIGH (ref 5–15)
BUN: 19 mg/dL (ref 6–20)
CO2: 22 mmol/L (ref 22–32)
Calcium: 8.9 mg/dL (ref 8.9–10.3)
Chloride: 97 mmol/L — ABNORMAL LOW (ref 98–111)
Creatinine, Ser: 0.77 mg/dL (ref 0.44–1.00)
GFR, Estimated: >60 mL/min (ref >60)
Glucose, Bld: 76 mg/dL (ref 70–99)
Potassium: 2.9 mmol/L — ABNORMAL LOW (ref 3.5–5.1)
Sodium: 138 mmol/L (ref 135–145)

## 2022-11-15 LAB — CBC
HCT: 40.1 % (ref 36.0–46.0)
Hemoglobin: 13.3 g/dL (ref 12.0–15.0)
MCH: 31.5 pg (ref 26.0–34.0)
MCHC: 33.2 g/dL (ref 30.0–36.0)
MCV: 95 fL (ref 80.0–100.0)
Platelets: 387 10*3/uL (ref 150–400)
RBC: 4.22 MIL/uL (ref 3.87–5.11)
RDW: 14.8 % (ref 11.5–15.5)
WBC: 8.6 10*3/uL (ref 4.0–10.5)
nRBC: 0 % (ref 0.0–0.2)

## 2022-11-15 LAB — CK: Total CK: 109 U/L (ref 38–234)

## 2022-11-15 MED ORDER — MORPHINE SULFATE (PF) 4 MG/ML IV SOLN
4.0000 mg | Freq: Once | INTRAVENOUS | Status: AC
  Administered 2022-11-15: 4 mg via INTRAVENOUS
  Filled 2022-11-15: qty 1

## 2022-11-15 MED ORDER — MILNACIPRAN HCL 50 MG PO TABS
50.0000 mg | ORAL_TABLET | Freq: Two times a day (BID) | ORAL | Status: DC
  Administered 2022-11-16 – 2022-11-19 (×6): 50 mg via ORAL
  Filled 2022-11-15 (×13): qty 1

## 2022-11-15 MED ORDER — ONDANSETRON HCL 4 MG PO TABS
4.0000 mg | ORAL_TABLET | Freq: Four times a day (QID) | ORAL | Status: DC | PRN
  Administered 2022-11-15: 4 mg via ORAL
  Filled 2022-11-15: qty 1

## 2022-11-15 MED ORDER — ONDANSETRON HCL 4 MG/2ML IJ SOLN: 4.0000 mg | Freq: Four times a day (QID) | INTRAMUSCULAR | Status: DC | PRN

## 2022-11-15 MED ORDER — ONDANSETRON HCL 4 MG/2ML IJ SOLN
4.0000 mg | Freq: Once | INTRAMUSCULAR | Status: AC
  Administered 2022-11-15: 4 mg via INTRAVENOUS
  Filled 2022-11-15: qty 2

## 2022-11-15 MED ORDER — POTASSIUM CHLORIDE 20 MEQ PO PACK
80.0000 meq | PACK | ORAL | Status: AC
  Administered 2022-11-15: 80 meq via ORAL
  Filled 2022-11-15: qty 4

## 2022-11-15 MED ORDER — OXYCODONE HCL 5 MG PO TABS
5.0000 mg | ORAL_TABLET | ORAL | Status: DC | PRN
  Administered 2022-11-15 – 2022-11-16 (×5): 5 mg via ORAL
  Filled 2022-11-15 (×6): qty 1

## 2022-11-15 MED ORDER — MORPHINE SULFATE (PF) 4 MG/ML IV SOLN
4.0000 mg | INTRAVENOUS | Status: AC
  Administered 2022-11-15: 4 mg via INTRAVENOUS
  Filled 2022-11-15: qty 1

## 2022-11-15 MED ORDER — PANTOPRAZOLE SODIUM 40 MG PO TBEC
40.0000 mg | DELAYED_RELEASE_TABLET | Freq: Every day | ORAL | Status: DC
  Administered 2022-11-15 – 2022-11-19 (×5): 40 mg via ORAL
  Filled 2022-11-15 (×5): qty 1

## 2022-11-15 MED ORDER — MORPHINE SULFATE (PF) 2 MG/ML IV SOLN
2.0000 mg | INTRAVENOUS | Status: DC | PRN
  Administered 2022-11-15 – 2022-11-17 (×6): 2 mg via INTRAVENOUS
  Filled 2022-11-15 (×6): qty 1

## 2022-11-15 MED ORDER — LAMOTRIGINE 100 MG PO TABS
100.0000 mg | ORAL_TABLET | Freq: Two times a day (BID) | ORAL | Status: DC
  Administered 2022-11-15 – 2022-11-19 (×8): 100 mg via ORAL
  Filled 2022-11-15 (×3): qty 1
  Filled 2022-11-15: qty 4
  Filled 2022-11-15 (×4): qty 1

## 2022-11-15 MED ORDER — NICOTINE 14 MG/24HR TD PT24
14.0000 mg | MEDICATED_PATCH | Freq: Every day | TRANSDERMAL | Status: DC
  Administered 2022-11-15 – 2022-11-19 (×4): 14 mg via TRANSDERMAL
  Filled 2022-11-15 (×5): qty 1

## 2022-11-15 NOTE — Progress Notes (Signed)
Discussed patient with Dr. Jarold Motto. Left displaced femoral neck fx, would benefit from total hip arthroplasty. Plan to transfer to [facility] for OR Saturday vs Sunday.

## 2022-11-15 NOTE — Assessment & Plan Note (Signed)
-   Smokes half a pack per day - Nicotine patch ordered - Encourage cessation

## 2022-11-15 NOTE — Progress Notes (Signed)
Patient has arrived on unit. Belongings at bedside with patient

## 2022-11-15 NOTE — Assessment & Plan Note (Signed)
Continue PPI ?

## 2022-11-15 NOTE — Assessment & Plan Note (Signed)
-   Potassium 2.9 - Patient reports decreased appetite and decreased p.o. intake over the last 2 days - Replace with 80 mEq of potassium in the ED - Trend in the a.m.

## 2022-11-15 NOTE — ED Provider Notes (Signed)
EMERGENCY DEPARTMENT AT [facility] Provider Note   CSN: [id number] Arrival date & time: 11/15/22  1614     History {Add pertinent medical, surgical, social history, OB history to HPI:1} Chief Complaint  Patient presents with   Sharon Welch is a 52 y.o. female.  Fell yesterday did have loc thinks she may have seized unk downtime       Home Medications Prior to Admission medications   Medication Sig Start Date End Date Taking? Authorizing Provider  albuterol (VENTOLIN HFA) 108 (90 Base) MCG/ACT inhaler Inhale 1-2 puffs into the lungs every 4 (four) hours as needed for wheezing or shortness of breath.    [provider]  amitriptyline (ELAVIL) 25 MG tablet Take 50 mg by mouth at bedtime.    [provider]  dexlansoprazole (DEXILANT) 60 MG capsule Take 60 mg by mouth daily.    [provider]  diclofenac Sodium (VOLTAREN) 1 % GEL Apply 2 g topically daily as needed (Pain). 02/12/22   [provider]  divalproex (DEPAKOTE ER) 500 MG 24 hr tablet Take 2 tablets (1,000 mg total) by mouth at bedtime. 07/18/22   Levert Feinstein, MD  folic acid (FOLVITE) 1 MG tablet Take 1 mg by mouth daily. 04/15/22   [provider]  HYDROcodone-acetaminophen (NORCO) 10-325 MG tablet Take 1 tablet by mouth every 6 (six) hours as needed for moderate pain or severe pain. 07/15/22   [provider]  hydrOXYzine (ATARAX) 25 MG tablet Take 25 mg by mouth 4 (four) times daily as needed. 07/16/22   [provider]  lamoTRIgine (LAMICTAL) 100 MG tablet Take 1 tablet (100 mg total) by mouth 2 (two) times daily. 10/29/22   Levert Feinstein, MD  lamoTRIgine (LAMICTAL) 25 MG tablet 1 twice a day x1wk 2 twice a day x 2nd wk 3 twice a day x3rd wk 10/29/22   Levert Feinstein, MD  linaclotide [facility]) 145 MCG CAPS capsule Take 145 mcg by mouth daily before breakfast.    [provider]  Milnacipran (SAVELLA) 50 MG TABS tablet Take 50 mg by  mouth 2 (two) times daily.    [provider]  naloxone [facility]) nasal spray 4 mg/0.1 mL Place 1 spray into the nose once. 01/16/22   [provider]  promethazine (PHENERGAN) 25 MG tablet Take 25 mg by mouth every 4 (four) hours as needed for nausea or vomiting.     [provider]  rizatriptan (MAXALT) 10 MG tablet Take 10 mg by mouth as needed for migraine. May repeat in 2 hours if needed    [provider]  tiZANidine (ZANAFLEX) 2 MG tablet Take 2 mg by mouth 2 (two) times daily.    [provider]      Allergies    Coconut flavor, Tramadol, Aleve [naproxen], and Pineapple    Review of Systems   Review of Systems  Physical Exam Updated Vital Signs BP 114/72   Pulse 90   Temp 98.5 F (36.9 C) (Oral)   Resp 18   Ht 5' (1.524 m)   Wt 44.9 kg   SpO2 100%   BMI 19.33 kg/m  Physical Exam  ED Results / Procedures / Treatments   Labs (all labs ordered are listed, but only abnormal results are displayed) Labs Reviewed  CBC  BASIC METABOLIC PANEL  CK    EKG None  Radiology DG Knee 2 Views Left  Result Date: 11/15/2022 CLINICAL DATA:  Fall  2 days ago EXAM: LEFT KNEE - 1-2 VIEW COMPARISON:  None Available. FINDINGS: No evidence of fracture, dislocation, or joint effusion. No evidence of arthropathy or other focal bone abnormality. Soft tissues are unremarkable. IMPRESSION: No fracture or dislocation of the left knee. Electronically Signed   By: Jearld Lesch M.D.   On: 11/15/2022 17:48   DG Hip Unilat With Pelvis 2-3 Views Left  Result Date: 11/15/2022 CLINICAL DATA:  Fall EXAM: DG HIP (WITH OR WITHOUT PELVIS) 2-3V LEFT COMPARISON:  11/08/2022 FINDINGS: Hardware in the lumbar spine. Pubic symphysis and rami appear intact. Acute left femoral neck fracture with cranial displacement of the distal femoral fracture fragment. Femoral head projects in joint. IMPRESSION: Acute displaced left femoral neck fracture. Electronically Signed   By:  Jasmine Pang M.D.   On: 11/15/2022 17:48    Procedures Procedures  {Document cardiac monitor, telemetry assessment procedure when appropriate:1}  Medications Ordered in ED Medications  morphine (PF) 4 MG/ML injection 4 mg (has no administration in time range)  ondansetron (ZOFRAN) injection 4 mg (has no administration in time range)    ED Course/ Medical Decision Making/ A&P   {   Click here for ABCD2, HEART and other calculatorsREFRESH Note before signing :1}                          Medical Decision Making Amount and/or Complexity of Data Reviewed Labs: ordered. Radiology: ordered.  Risk Prescription drug management. Decision regarding hospitalization.   ***  {Document critical care time when appropriate:1} {Document review of labs and clinical decision tools ie heart score, Chads2Vasc2 etc:1}  {Document your independent review of radiology images, and any outside records:1} {Document your discussion with family members, caretakers, and with consultants:1} {Document social determinants of health affecting pt's care:1} {Document your decision making why or why not admission, treatments were needed:1} Final Clinical Impression(s) / ED Diagnoses Final diagnoses:  None    Rx / DC Orders ED Discharge Orders     None

## 2022-11-15 NOTE — ED Triage Notes (Addendum)
Pt via POV after 2 mechanical falls at home on Wednesday morning, 2 days ago. She had a 3-day EEG that finished earlier today and then she came to ER. Pt reports pain to left hip radiating to left knee. She also ripped her left great toenail off and says she hit her head when she fell the second time. No thinners. Pt unable to tolerate weight bearing. Pain rated 9/10.

## 2022-11-15 NOTE — Assessment & Plan Note (Addendum)
-   Fall 2 days ago - Imaging shows left hip fracture - Ortho consulted and recommends admission to [facility] - Pain control pain scale - Continue home pain medications for chronic pain - N.p.o. after midnight - Continue to monitor

## 2022-11-15 NOTE — H&P (Signed)
History and Physical    Patient: Sharon Welch ZHY:[id number] DOB: 1970/12/11 DOA: 11/15/2022 DOS: the patient was seen and examined on 11/15/2022 PCP: Elfredia Nevins, MD  Patient coming from: Home  Chief Complaint:  Chief Complaint  Patient presents with   Fall   HPI: Sharon Welch is a 52 y.o. female with medical history significant of seizure disorder, fibromyalgia, GERD, anxiety, and more presents the ED with a chief complaint of fall.  Patient reports that she has been having seizures which caused her to fall.  She fell down a flight of stairs last week.  2 days ago she fell, and when she woke up she knew she had broken her hip.  Patient reports that every time this happens, she just wakes up on the ground.  She does not have any recollection of the fall itself.  She reports that she was going to have 24-hour EEG monitoring, so she did not want to miss that by coming into the hospital for broken hip.  She did her EEG monitoring and then came into the hospital.  She reports that she has been in so much pain all she has been able to do is lay on the couch.  She has not even been able to ambulate to the bathroom and has been using a depends.  She slept all day Thursday, because it was only when she could deal with the pain.  She reports very little p.o. intake over Thursday and Friday due to the pain.  She did have a normal meal on Wednesday.  Patient reports little tightness in her chest that is normal for her anxiety symptoms.  She has no other complaints on review of systems.  Patient does smoke half a pack per day.  She does not drink, does not use illicit drugs.  She is vaccinated for COVID.  Patient is full code. Review of Systems: As mentioned in the history of present illness. All other systems reviewed and are negative. Past Medical History:  Diagnosis Date   Bruising, spontaneous 12/08/2013   Cancer (HCC)    cervical cancer - had hysterectomy   Chronic back pain    Chronic back pain     DDD (degenerative disc disease), lumbar    DDD (degenerative disc disease), lumbar    Fibromyalgia    Headache(784.0)    migraines   Ovarian cyst    Partial tear subscapularis tendon 12/27/2011   Scoliosis    Seizures (HCC)    started 9/12-   Past Surgical History:  Procedure Laterality Date   ABDOMINAL HYSTERECTOMY  2012   BACK SURGERY  1989   scoliosis throsic-rods   CESAREAN SECTION     CHOLECYSTECTOMY N/A 11/17/2012   Procedure: LAPAROSCOPIC CHOLECYSTECTOMY WITH INTRAOPERATIVE CHOLANGIOGRAM;  Surgeon: Axel Filler, MD;  Location: [facility] OR;  Service: General;  Laterality: N/A;   COLONOSCOPY WITH PROPOFOL N/A 04/21/2020   Procedure: COLONOSCOPY WITH PROPOFOL;  Surgeon: Jeani Hawking, MD;  Location: [facility] ENDOSCOPY;  Service: Endoscopy;  Laterality: N/A;   ENDOSCOPIC RETROGRADE CHOLANGIOPANCREATOGRAPHY (ERCP) WITH PROPOFOL N/A 06/11/2019   Procedure: ENDOSCOPIC RETROGRADE CHOLANGIOPANCREATOGRAPHY (ERCP) WITH PROPOFOL;  Surgeon: Jeani Hawking, MD;  Location: [facility] ENDOSCOPY;  Service: Endoscopy;  Laterality: N/A;   ERCP N/A 05/07/2013   Procedure: ENDOSCOPIC RETROGRADE CHOLANGIOPANCREATOGRAPHY (ERCP);  Surgeon: Theda Belfast, MD;  Location: [facility];  Service: Endoscopy;  Laterality: N/A;  start ercp first, if canulation fails switch to EUS    ESOPHAGOGASTRODUODENOSCOPY (EGD) WITH PROPOFOL N/A 05/15/2019   Procedure:  ESOPHAGOGASTRODUODENOSCOPY (EGD) WITH PROPOFOL;  Surgeon: West Bali, MD;  Location: [facility];  Service: Endoscopy;  Laterality: N/A;   ESOPHAGOGASTRODUODENOSCOPY (EGD) WITH PROPOFOL N/A 03/03/2020   Procedure: ESOPHAGOGASTRODUODENOSCOPY (EGD) WITH PROPOFOL;  Surgeon: Jeani Hawking, MD;  Location: [facility];  Service: Endoscopy;  Laterality: N/A;   ESOPHAGOGASTRODUODENOSCOPY (EGD) WITH PROPOFOL N/A 04/21/2020   Procedure: ESOPHAGOGASTRODUODENOSCOPY (EGD) WITH PROPOFOL;  Surgeon: Jeani Hawking, MD;  Location: [facility];  Service: Endoscopy;  Laterality: N/A;    EUS N/A 11/03/2012   Procedure: UPPER ENDOSCOPIC ULTRASOUND (EUS) LINEAR;  Surgeon: Theda Belfast, MD;  Location: [facility];  Service: Endoscopy;  Laterality: N/A;   left arm     NECK SURGERY     POLYPECTOMY  04/21/2020   Procedure: POLYPECTOMY;  Surgeon: Jeani Hawking, MD;  Location: [facility];  Service: Endoscopy;;   REMOVAL OF STONES  06/11/2019   Procedure: REMOVAL OF STONES;  Surgeon: Jeani Hawking, MD;  Location: [facility];  Service: Endoscopy;;   TOOTH EXTRACTION N/A 05/24/2022   Procedure: DENTAL RESTORATION/EXTRACTIONS;  Surgeon: Ocie Doyne, DMD;  Location: [facility] OR;  Service: Oral Surgery;  Laterality: N/A;   TUBAL LIGATION     UPPER ESOPHAGEAL ENDOSCOPIC ULTRASOUND (EUS) N/A 06/11/2019   Procedure: UPPER ESOPHAGEAL ENDOSCOPIC ULTRASOUND (EUS);  Surgeon: Jeani Hawking, MD;  Location: [facility];  Service: Endoscopy;  Laterality: N/A;   Social History:  reports that she has been smoking cigarettes. She has a 10.00 pack-year smoking history. She has never used smokeless tobacco. She reports that she does not currently use alcohol. She reports that she does not use drugs.  Allergies  Allergen Reactions   Coconut Flavor Swelling   Tramadol Other (See Comments)    Possible seizures   Aleve [Naproxen] Other (See Comments)    Ulcers   Pineapple Other (See Comments)    Migraine    Family History  Problem Relation Age of Onset   Hypertension Mother    Hypertension Father    Cancer Father        prostate   Heart disease Father    Hypertension Brother    Hypertension Brother     Prior to Admission medications   Medication Sig Start Date End Date Taking? Authorizing Provider  albuterol (VENTOLIN HFA) 108 (90 Base) MCG/ACT inhaler Inhale 1-2 puffs into the lungs every 4 (four) hours as needed for wheezing or shortness of breath.    [provider]  amitriptyline (ELAVIL) 25 MG tablet Take 50 mg by mouth at bedtime.    [provider]  dexlansoprazole  (DEXILANT) 60 MG capsule Take 60 mg by mouth daily.    [provider]  diclofenac Sodium (VOLTAREN) 1 % GEL Apply 2 g topically daily as needed (Pain). 02/12/22   [provider]  divalproex (DEPAKOTE ER) 500 MG 24 hr tablet Take 2 tablets (1,000 mg total) by mouth at bedtime. 07/18/22   Levert Feinstein, MD  folic acid (FOLVITE) 1 MG tablet Take 1 mg by mouth daily. 04/15/22   [provider]  HYDROcodone-acetaminophen (NORCO) 10-325 MG tablet Take 1 tablet by mouth every 6 (six) hours as needed for moderate pain or severe pain. 07/15/22   [provider]  hydrOXYzine (ATARAX) 25 MG tablet Take 25 mg by mouth 4 (four) times daily as needed. 07/16/22   [provider]  lamoTRIgine (LAMICTAL) 100 MG tablet Take 1 tablet (100 mg total) by mouth 2 (two) times daily. 10/29/22   Levert Feinstein, MD  lamoTRIgine (LAMICTAL)  25 MG tablet 1 twice a day x1wk 2 twice a day x 2nd wk 3 twice a day x3rd wk 10/29/22   Levert Feinstein, MD  linaclotide [facility]) 145 MCG CAPS capsule Take 145 mcg by mouth daily before breakfast.    [provider]  Milnacipran (SAVELLA) 50 MG TABS tablet Take 50 mg by mouth 2 (two) times daily.    [provider]  naloxone [facility]) nasal spray 4 mg/0.1 mL Place 1 spray into the nose once. 01/16/22   [provider]  promethazine (PHENERGAN) 25 MG tablet Take 25 mg by mouth every 4 (four) hours as needed for nausea or vomiting.     [provider]  rizatriptan (MAXALT) 10 MG tablet Take 10 mg by mouth as needed for migraine. May repeat in 2 hours if needed    [provider]  tiZANidine (ZANAFLEX) 2 MG tablet Take 2 mg by mouth 2 (two) times daily.    [provider]    Physical Exam: Vitals:   11/15/22 1706 11/15/22 1706  BP:  114/72  Pulse:  90  Resp:  18  Temp:  98.5 F (36.9 C)  TempSrc:  Oral  SpO2:  100%  Weight: 44.9 kg   Height: 5' (1.524 m)    1.  General: Patient lying right lateral  decubitus in bed,  no acute distress   2. Psychiatric: Alert and oriented x 3, mood and behavior normal for situation, pleasant and cooperative with exam   3. Neurologic: Speech and language are normal, face is symmetric, moves all 4 extremities voluntarily, at baseline without acute deficits on limited exam   4. HEENMT:  Head is atraumatic, normocephalic, pupils reactive to light, neck is supple, trachea is midline, mucous membranes are moist   5. Respiratory : Lungs are clear to auscultation bilaterally without wheezing, rhonchi, rales, no cyanosis, no increase in work of breathing or accessory muscle use   6. Cardiovascular : Heart rate normal, rhythm is regular, no murmurs, rubs or gallops, no peripheral edema, peripheral pulses palpated   7. Gastrointestinal:  Abdomen is soft, nondistended, nontender to palpation bowel sounds active, no masses or organomegaly palpated   8. Skin:  Skin is warm, dry and intact without rashes, acute lesions, or ulcers on limited exam   9.Musculoskeletal:  No asymmetry in tone, no peripheral edema, peripheral pulses palpated, no tenderness to palpation in the extremities  Data Reviewed: In the ED  Temp 98.5, heart rate 90, respiratory 18, blood pressure 114/72, satting at 100% No leukocytosis with white blood cell count of 8.6, hemoglobin 13.3 Chemistry reveals a hypokalemia at 2.9 CT head and C-spine shows no acute intracranial pathology, no acute fracture or static subluxation of the cervical spine Left knee imaging was without acute changes X-ray left hip shows acute displaced femoral neck fracture X-ray chest shows no acute changes Ortho was consulted and recommended admission to [facility], brief note from them is in Admission requested for hip fracture   Assessment and Plan: * Hip fracture (HCC) - Fall 2 days ago - Imaging shows left hip fracture - Ortho consulted and recommends admission to [facility] - Pain control pain scale -  Continue home pain medications for chronic pain - N.p.o. after midnight - Continue to monitor  Tobacco use - Smokes half a pack per day - Nicotine patch ordered - Encourage cessation  GERD (gastroesophageal reflux disease) - Continue PPI  Seizure disorder (HCC) - Patient reports she no longer takes Depakote -  She is currently on Lamictal - Continue Lamictal - Seizure precautions - Neuroconsult - Continue to monitor  Hypokalemia - Potassium 2.9 - Patient reports decreased appetite and decreased p.o. intake over the last 2 days - Replace with 80 mEq of potassium in the ED - Trend in the a.m.      Advance Care Planning:   Code Status: Full Code   Consults: Ortho  Family Communication: No family at bedside  Severity of Illness: The appropriate patient status for this patient is INPATIENT. Inpatient status is judged to be reasonable and necessary in order to provide the required intensity of service to ensure the patient's safety. The patient's presenting symptoms, physical exam findings, and initial radiographic and laboratory data in the context of their chronic comorbidities is felt to place them at high risk for further clinical deterioration. Furthermore, it is not anticipated that the patient will be medically stable for discharge from the hospital within 2 midnights of admission.   * I certify that at the point of admission it is my clinical judgment that the patient will require inpatient hospital care spanning beyond 2 midnights from the point of admission due to high intensity of service, high risk for further deterioration and high frequency of surveillance required.*  Author: Lilyan Gilford, DO 11/15/2022 9:24 PM  For on call review [email or web address].

## 2022-11-15 NOTE — ED Notes (Signed)
Dr. z at bedside.

## 2022-11-15 NOTE — Assessment & Plan Note (Signed)
-   Patient reports she no longer takes Depakote - She is currently on Lamictal - Continue Lamictal - Seizure precautions - Neuroconsult - Continue to monitor

## 2022-11-16 ENCOUNTER — Inpatient Hospital Stay (HOSPITAL_COMMUNITY): Payer: 59

## 2022-11-16 DIAGNOSIS — K219 Gastro-esophageal reflux disease without esophagitis: Secondary | ICD-10-CM | POA: Diagnosis not present

## 2022-11-16 DIAGNOSIS — S72002P Fracture of unspecified part of neck of left femur, subsequent encounter for closed fracture with malunion: Secondary | ICD-10-CM

## 2022-11-16 DIAGNOSIS — E876 Hypokalemia: Secondary | ICD-10-CM | POA: Diagnosis not present

## 2022-11-16 DIAGNOSIS — R7401 Elevation of levels of liver transaminase levels: Secondary | ICD-10-CM

## 2022-11-16 DIAGNOSIS — G40909 Epilepsy, unspecified, not intractable, without status epilepticus: Secondary | ICD-10-CM | POA: Diagnosis not present

## 2022-11-16 LAB — COMPREHENSIVE METABOLIC PANEL
ALT: 141 U/L — ABNORMAL HIGH (ref 0–44)
AST: 417 U/L — ABNORMAL HIGH (ref 15–41)
Albumin: 3 g/dL — ABNORMAL LOW (ref 3.5–5.0)
Alkaline Phosphatase: 1650 U/L — ABNORMAL HIGH (ref 38–126)
Anion gap: 15 (ref 5–15)
BUN: 14 mg/dL (ref 6–20)
CO2: 20 mmol/L — ABNORMAL LOW (ref 22–32)
Calcium: 8.6 mg/dL — ABNORMAL LOW (ref 8.9–10.3)
Chloride: 101 mmol/L (ref 98–111)
Creatinine, Ser: 0.89 mg/dL (ref 0.44–1.00)
GFR, Estimated: >60 mL/min (ref >60)
Glucose, Bld: 90 mg/dL (ref 70–99)
Potassium: 5 mmol/L (ref 3.5–5.1)
Sodium: 136 mmol/L (ref 135–145)
Total Bilirubin: 2 mg/dL — ABNORMAL HIGH (ref 0.3–1.2)
Total Protein: 6.3 g/dL — ABNORMAL LOW (ref 6.5–8.1)

## 2022-11-16 LAB — CBC WITH DIFFERENTIAL/PLATELET
Abs Immature Granulocytes: 0.08 10*3/uL — ABNORMAL HIGH (ref 0.00–0.07)
Basophils Absolute: 0.1 10*3/uL (ref 0.0–0.1)
Basophils Relative: 1 %
Eosinophils Absolute: 0 10*3/uL (ref 0.0–0.5)
Eosinophils Relative: 0 %
HCT: 38.7 % (ref 36.0–46.0)
Hemoglobin: 12.8 g/dL (ref 12.0–15.0)
Immature Granulocytes: 1 %
Lymphocytes Relative: 16 %
Lymphs Abs: 1.7 10*3/uL (ref 0.7–4.0)
MCH: 31.8 pg (ref 26.0–34.0)
MCHC: 33.1 g/dL (ref 30.0–36.0)
MCV: 96.3 fL (ref 80.0–100.0)
Monocytes Absolute: 2.5 10*3/uL — ABNORMAL HIGH (ref 0.1–1.0)
Monocytes Relative: 24 %
Neutro Abs: 6.1 10*3/uL (ref 1.7–7.7)
Neutrophils Relative %: 58 %
Platelets: 393 10*3/uL (ref 150–400)
RBC: 4.02 MIL/uL (ref 3.87–5.11)
RDW: 14.9 % (ref 11.5–15.5)
WBC: 10.5 10*3/uL (ref 4.0–10.5)
nRBC: 0 % (ref 0.0–0.2)

## 2022-11-16 LAB — URINALYSIS, ROUTINE W REFLEX MICROSCOPIC
Bilirubin Urine: NEGATIVE
Glucose, UA: NEGATIVE mg/dL
Hgb urine dipstick: NEGATIVE
Ketones, ur: 80 mg/dL — AB
Leukocytes,Ua: NEGATIVE
Nitrite: NEGATIVE
Protein, ur: NEGATIVE mg/dL
Specific Gravity, Urine: 1.014 (ref 1.005–1.030)
pH: 5 (ref 5.0–8.0)

## 2022-11-16 LAB — HEPATITIS PANEL, ACUTE
HCV Ab: NONREACTIVE
Hep A IgM: NONREACTIVE
Hep B C IgM: NONREACTIVE
Hepatitis B Surface Ag: NONREACTIVE

## 2022-11-16 LAB — ACETAMINOPHEN LEVEL: Acetaminophen (Tylenol), Serum: <10 ug/mL — ABNORMAL LOW (ref 10–30)

## 2022-11-16 LAB — MAGNESIUM: Magnesium: 1.9 mg/dL (ref 1.7–2.4)

## 2022-11-16 MED ORDER — ACETAMINOPHEN 325 MG PO TABS: 650.0000 mg | ORAL_TABLET | Freq: Four times a day (QID) | ORAL | Status: DC | PRN

## 2022-11-16 MED ORDER — FOLIC ACID 1 MG PO TABS
1.0000 mg | ORAL_TABLET | Freq: Every day | ORAL | Status: DC
  Administered 2022-11-16 – 2022-11-19 (×4): 1 mg via ORAL
  Filled 2022-11-16 (×4): qty 1

## 2022-11-16 MED ORDER — CHLORHEXIDINE GLUCONATE 4 % EX SOLN
60.0000 mL | Freq: Once | CUTANEOUS | Status: AC
  Administered 2022-11-17: 4 via TOPICAL
  Filled 2022-11-16: qty 60

## 2022-11-16 MED ORDER — ACETAMINOPHEN 650 MG RE SUPP: 650.0000 mg | Freq: Four times a day (QID) | RECTAL | Status: DC | PRN

## 2022-11-16 MED ORDER — HYDROXYZINE HCL 25 MG PO TABS: 25.0000 mg | ORAL_TABLET | Freq: Four times a day (QID) | ORAL | Status: DC | PRN

## 2022-11-16 MED ORDER — LINACLOTIDE 145 MCG PO CAPS
145.0000 ug | ORAL_CAPSULE | Freq: Every day | ORAL | Status: DC
  Administered 2022-11-18 – 2022-11-19 (×2): 145 ug via ORAL
  Filled 2022-11-16 (×4): qty 1

## 2022-11-16 MED ORDER — SODIUM CHLORIDE 0.9 % IV SOLN: INTRAVENOUS | Status: DC

## 2022-11-16 MED ORDER — AMITRIPTYLINE HCL 50 MG PO TABS
50.0000 mg | ORAL_TABLET | Freq: Every day | ORAL | Status: DC
  Administered 2022-11-16 – 2022-11-18 (×4): 50 mg via ORAL
  Filled 2022-11-16 (×4): qty 1

## 2022-11-16 MED ORDER — TIZANIDINE HCL 4 MG PO TABS
2.0000 mg | ORAL_TABLET | Freq: Two times a day (BID) | ORAL | Status: DC
  Administered 2022-11-16 – 2022-11-17 (×2): 2 mg via ORAL
  Filled 2022-11-16 (×2): qty 1

## 2022-11-16 NOTE — Progress Notes (Signed)
PROGRESS NOTE    Sharon Welch  ZOX:[id number] DOB: 12-Dec-1970 DOA: 11/15/2022 PCP: Elfredia Nevins, MD   Chief Complaint  Patient presents with   Fall    Brief Narrative:  Patient 52 year old female history of seizure disorder, fibromyalgia, GERD, anxiety presented to the ED with chief complaints of a fall.  Patient reported she been having seizures which caused her to fall.  Patient noted to have falling down a flight of stairs 1 week prior to admission.  2 days prior to admission patient fell woke up and was concerned she broke her hip.  Patient reported was having a 24-hour EEG monitoring so did not want to miss that by coming to the hospital for broken hip.  EEG monitoring was done and patient subsequently presented to the hospital.  Imaging done concerning for left femoral neck fracture.  Orthopedics consulted.  Neurology also consulted in relation to patient's seizures.   Assessment & Plan:   Principal Problem:   Hip fracture (HCC) Active Problems:   Hypokalemia   Seizure disorder (HCC)   GERD (gastroesophageal reflux disease)   Tobacco use   Transaminitis  #1 left femoral neck fracture -Secondary to mechanical fall from proposed seizure-like activity.  Patient history. -Orthopedics consulted who have assessed the patient and recommending operative fixation tomorrow per Dr. [name]. -Patient will be n.p.o. after midnight. -Pain control, postop DVT prophylaxis per orthopedics.  2.  Seizure disorder -Patient reported had been having seizures leading to multiple falls prior to admission. -Patient states no longer takes Depakote only on Lamictal. -Spoke with neurologist/consulted neurology who reviewed chart and reviewed care everywhere which revealed that patient with a history of psychogenic nonepileptic seizures, diagnosed after extensive workup at [facility] including evaluation in ED EMU. -Per neurology patient supposed to be on Lamictal and Depakote but not taking Depakote and  recommended continuation of Lamictal for now, seizure precautions with outpatient follow-up with primary neurologist. -Per neurology if patient has any further seizure episodes inpatient may consider LTM EEG. -Continue home regimen Lamictal for now.  3.  Tobacco abuse -Tobacco cessation. -Nicotine patch.  4.  GERD -PPI.  5.  Hypokalemia -Repleted. -Magnesium at 1.9. -Repeat labs in the AM.  6.  Transaminitis ??  Etiology -Check abdominal ultrasound. -Tylenol level noted at < 10. -Acute hepatitis panel ordered negative. -Hydrated with IV fluids. -Repeat labs in AM.    DVT prophylaxis: SCDs..  Postop DVT prophylaxis per orthopedics. Code Status: Full Family Communication: Updated patient, daughter at bedside. Disposition: TBD  Status is: Inpatient Remains inpatient appropriate because: Severity of illness   Consultants:  Orthopedics Neurology  Procedures:  CT head CT C-spine 11/15/2022 Plain films of the left knee 11/15/2022 Plain films of the left hip and pelvis 11/15/2022 Abdominal ultrasound 11/16/2022--   Antimicrobials:  Anti-infectives (From admission, onward)    None         Subjective: Patient In bed.  No chest pain or shortness of breath.  No abdominal pain.  Complains of left lower extremity pain.  Daughter at bedside.  Objective: Vitals:   11/15/22 2353 11/16/22 0447 11/16/22 0741 11/16/22 1629  BP: (!) 141/73 (!) 146/76 (!) 158/75 (!) 158/77  Pulse: (!) 101 (!) 110 (!) 102 92  Resp: 18 17 16 16   Temp: 98.3 F (36.8 C) 98.4 F (36.9 C) 99.7 F (37.6 C) 98.3 F (36.8 C)  TempSrc: Oral  Oral Oral  SpO2: 100% 98% 99% 100%  Weight:      Height:  Intake/Output Summary (Last 24 hours) at 11/16/2022 1632 Last data filed at 11/16/2022 0523 Gross per 24 hour  Intake 237 ml  Output 200 ml  Net 37 ml   Filed Weights   11/15/22 1706  Weight: 44.9 kg    Examination:  General exam: Appears calm and comfortable  Respiratory system:  Clear to auscultation. Respiratory effort normal. Cardiovascular system: S1 & S2 heard, RRR. No JVD, murmurs, rubs, gallops or clicks. No pedal edema. Gastrointestinal system: Abdomen is nondistended, soft and nontender. No organomegaly or masses felt. Normal bowel sounds heard. Central nervous system: Alert and oriented. No focal neurological deficits. Extremities: Left lower extremity shortened and externally rotated.  Skin: No rashes, lesions or ulcers Psychiatry: Judgement and insight appear normal. Mood & affect appropriate.     Data Reviewed: I have personally reviewed following labs and imaging studies  CBC: Recent Labs  Lab 11/15/22 1901 11/16/22 0159  WBC 8.6 10.5  NEUTROABS  --  6.1  HGB 13.3 12.8  HCT 40.1 38.7  MCV 95.0 96.3  PLT 387 393    Basic Metabolic Panel: Recent Labs  Lab 11/15/22 1901 11/16/22 0159  NA 138 136  K 2.9* 5.0  CL 97* 101  CO2 22 20*  GLUCOSE 76 90  BUN 19 14  CREATININE 0.77 0.89  CALCIUM 8.9 8.6*  MG  --  1.9    GFR: Estimated Creatinine Clearance: 51.8 mL/min (by C-G formula based on SCr of 0.89 mg/dL).  Liver Function Tests: Recent Labs  Lab 11/16/22 0159  AST 417*  ALT 141*  ALKPHOS 1,650*  BILITOT 2.0*  PROT 6.3*  ALBUMIN 3.0*    CBG: No results for input(s): "GLUCAP" in the last 168 hours.   No results found for this or any previous visit (from the past 240 hour(s)).       Radiology Studies: CT Head Wo Contrast  Result Date: 11/15/2022 CLINICAL DATA:  Multiple falls EXAM: CT HEAD WITHOUT CONTRAST CT CERVICAL SPINE WITHOUT CONTRAST TECHNIQUE: Multidetector CT imaging of the head and cervical spine was performed following the standard protocol without intravenous contrast. Multiplanar CT image reconstructions of the cervical spine were also generated. RADIATION DOSE REDUCTION: This exam was performed according to the departmental dose-optimization program which includes automated exposure control, adjustment of  the mA and/or kV according to patient size and/or use of iterative reconstruction technique. COMPARISON:  11/08/2022 FINDINGS: CT HEAD FINDINGS Brain: No evidence of acute infarction, hemorrhage, hydrocephalus, extra-axial collection or mass lesion/mass effect. Vascular: No hyperdense vessel or unexpected calcification. Skull: Normal. Negative for fracture or focal lesion. Sinuses/Orbits: No acute finding. Other: None. CT CERVICAL SPINE FINDINGS Alignment: Normal. Skull base and vertebrae: No acute fracture. No primary bone lesion or focal pathologic process. Soft tissues and spinal canal: No prevertebral fluid or swelling. No visible canal hematoma. Disc levels: Anterior cervical discectomy and fusion at C6-C7. Otherwise intact disc spaces. Upper chest: Negative. Other: None. IMPRESSION: 1. No acute intracranial pathology. 2. No fracture or static subluxation of the cervical spine. 3. Anterior cervical discectomy and fusion at C6-C7. Electronically Signed   By: Jearld Lesch M.D.   On: 11/15/2022 19:35   CT Cervical Spine Wo Contrast  Result Date: 11/15/2022 CLINICAL DATA:  Multiple falls EXAM: CT HEAD WITHOUT CONTRAST CT CERVICAL SPINE WITHOUT CONTRAST TECHNIQUE: Multidetector CT imaging of the head and cervical spine was performed following the standard protocol without intravenous contrast. Multiplanar CT image reconstructions of the cervical spine were also generated. RADIATION DOSE REDUCTION: This  exam was performed according to the departmental dose-optimization program which includes automated exposure control, adjustment of the mA and/or kV according to patient size and/or use of iterative reconstruction technique. COMPARISON:  11/08/2022 FINDINGS: CT HEAD FINDINGS Brain: No evidence of acute infarction, hemorrhage, hydrocephalus, extra-axial collection or mass lesion/mass effect. Vascular: No hyperdense vessel or unexpected calcification. Skull: Normal. Negative for fracture or focal lesion.  Sinuses/Orbits: No acute finding. Other: None. CT CERVICAL SPINE FINDINGS Alignment: Normal. Skull base and vertebrae: No acute fracture. No primary bone lesion or focal pathologic process. Soft tissues and spinal canal: No prevertebral fluid or swelling. No visible canal hematoma. Disc levels: Anterior cervical discectomy and fusion at C6-C7. Otherwise intact disc spaces. Upper chest: Negative. Other: None. IMPRESSION: 1. No acute intracranial pathology. 2. No fracture or static subluxation of the cervical spine. 3. Anterior cervical discectomy and fusion at C6-C7. Electronically Signed   By: Jearld Lesch M.D.   On: 11/15/2022 19:35   DG Knee 2 Views Left  Result Date: 11/15/2022 CLINICAL DATA:  Fall 2 days ago EXAM: LEFT KNEE - 1-2 VIEW COMPARISON:  None Available. FINDINGS: No evidence of fracture, dislocation, or joint effusion. No evidence of arthropathy or other focal bone abnormality. Soft tissues are unremarkable. IMPRESSION: No fracture or dislocation of the left knee. Electronically Signed   By: Jearld Lesch M.D.   On: 11/15/2022 17:48   DG Hip Unilat With Pelvis 2-3 Views Left  Result Date: 11/15/2022 CLINICAL DATA:  Fall EXAM: DG HIP (WITH OR WITHOUT PELVIS) 2-3V LEFT COMPARISON:  11/08/2022 FINDINGS: Hardware in the lumbar spine. Pubic symphysis and rami appear intact. Acute left femoral neck fracture with cranial displacement of the distal femoral fracture fragment. Femoral head projects in joint. IMPRESSION: Acute displaced left femoral neck fracture. Electronically Signed   By: Jasmine Pang M.D.   On: 11/15/2022 17:48        Scheduled Meds:  amitriptyline  50 mg Oral QHS   folic acid  1 mg Oral Daily   lamoTRIgine  100 mg Oral BID   [START ON 11/17/2022] linaclotide  145 mcg Oral QAC breakfast   Milnacipran  50 mg Oral BID   nicotine  14 mg Transdermal Daily   pantoprazole  40 mg Oral Daily   tiZANidine  2 mg Oral BID   Continuous Infusions:  sodium chloride 125 mL/hr at  11/16/22 1009     LOS: 1 day    Time spent: 40 minutes    Ramiro Harvest, MD [facility]   To contact the attending provider between 7A-7P or the covering provider during after hours 7P-7A, please log into the web site [email or web address] and access using universal [employer] password for that web site. If you do not have the password, please call the hospital operator.  11/16/2022, 4:32 PM

## 2022-11-16 NOTE — Consult Note (Signed)
   ORTHOPAEDIC CONSULTATION  REQUESTING PHYSICIAN: Thompson, Daniel V, MD  Chief Complaint: left hip fracture  HPI: Sharon Welch is a 52 y.o. female with medical history significant of seizure disorder, fibromyalgia, GERD, anxiety, and more presents the ED with a chief complaint of fall 3 days ago. She was going to have an EEG monitor placed for 24 hours, so she did not want to miss that by going to the hospital when she fell on Wednesday. Patient was taken to the ED. Orthopedic work-up significant for left femoral neck fracture. Orthopedics consulted.  Patient seen on 5N. Complaining of pain in her left hip. Improved with medications. She cannot find a comfortable position. She normally ambulates without assistance. She lives at home with her husband.   Past Medical History:  Diagnosis Date   Bruising, spontaneous 12/08/2013   Cancer (HCC)    cervical cancer - had hysterectomy   Chronic back pain    Chronic back pain    DDD (degenerative disc disease), lumbar    DDD (degenerative disc disease), lumbar    Fibromyalgia    Headache(784.0)    migraines   Ovarian cyst    Partial tear subscapularis tendon 12/27/2011   Scoliosis    Seizures (HCC)    started 9/12-   Past Surgical History:  Procedure Laterality Date   ABDOMINAL HYSTERECTOMY  2012   BACK SURGERY  1989   scoliosis throsic-rods   CESAREAN SECTION     CHOLECYSTECTOMY N/A 11/17/2012   Procedure: LAPAROSCOPIC CHOLECYSTECTOMY WITH INTRAOPERATIVE CHOLANGIOGRAM;  Surgeon: Armando Ramirez, MD;  Location: [facility];  Service: General;  Laterality: N/A;   COLONOSCOPY WITH PROPOFOL N/A 04/21/2020   Procedure: COLONOSCOPY WITH PROPOFOL;  Surgeon: Hung, Patrick, MD;  Location: [facility];  Service: Endoscopy;  Laterality: N/A;   ENDOSCOPIC RETROGRADE CHOLANGIOPANCREATOGRAPHY (ERCP) WITH PROPOFOL N/A 06/11/2019   Procedure: ENDOSCOPIC RETROGRADE CHOLANGIOPANCREATOGRAPHY (ERCP) WITH PROPOFOL;  Surgeon: Hung, Patrick, MD;  Location: [facility];  Service: Endoscopy;  Laterality: N/A;   ERCP N/A 05/07/2013   Procedure: ENDOSCOPIC RETROGRADE CHOLANGIOPANCREATOGRAPHY (ERCP);  Surgeon: Patrick D Hung, MD;  Location: [facility];  Service: Endoscopy;  Laterality: N/A;  start ercp first, if canulation fails switch to EUS    ESOPHAGOGASTRODUODENOSCOPY (EGD) WITH PROPOFOL N/A 05/15/2019   Procedure: ESOPHAGOGASTRODUODENOSCOPY (EGD) WITH PROPOFOL;  Surgeon: Fields, Sandi L, MD;  Location: [facility];  Service: Endoscopy;  Laterality: N/A;   ESOPHAGOGASTRODUODENOSCOPY (EGD) WITH PROPOFOL N/A 03/03/2020   Procedure: ESOPHAGOGASTRODUODENOSCOPY (EGD) WITH PROPOFOL;  Surgeon: Hung, Patrick, MD;  Location: [facility];  Service: Endoscopy;  Laterality: N/A;   ESOPHAGOGASTRODUODENOSCOPY (EGD) WITH PROPOFOL N/A 04/21/2020   Procedure: ESOPHAGOGASTRODUODENOSCOPY (EGD) WITH PROPOFOL;  Surgeon: Hung, Patrick, MD;  Location: [facility];  Service: Endoscopy;  Laterality: N/A;   EUS N/A 11/03/2012   Procedure: UPPER ENDOSCOPIC ULTRASOUND (EUS) LINEAR;  Surgeon: Patrick D Hung, MD;  Location: [facility];  Service: Endoscopy;  Laterality: N/A;   left arm     NECK SURGERY     POLYPECTOMY  04/21/2020   Procedure: POLYPECTOMY;  Surgeon: Hung, Patrick, MD;  Location: [facility];  Service: Endoscopy;;   REMOVAL OF STONES  06/11/2019   Procedure: REMOVAL OF STONES;  Surgeon: Hung, Patrick, MD;  Location: [facility];  Service: Endoscopy;;   TOOTH EXTRACTION N/A 05/24/2022   Procedure: DENTAL RESTORATION/EXTRACTIONS;  Surgeon: Jensen, Scott, DMD;  Location: [facility];  Service: Oral Surgery;  Laterality: N/A;   TUBAL LIGATION     UPPER ESOPHAGEAL ENDOSCOPIC ULTRASOUND (EUS)   N/A 06/11/2019   Procedure: UPPER ESOPHAGEAL ENDOSCOPIC ULTRASOUND (EUS);  Surgeon: Hung, Patrick, MD;  Location: [facility];  Service: Endoscopy;  Laterality: N/A;   Social History   Socioeconomic History   Marital status: Married    Spouse name: Not on file   Number of  children: Not on file   Years of education: Not on file   Highest education level: Not on file  Occupational History   Not on file  Tobacco Use   Smoking status: Every Day    Packs/day: 0.50    Years: 20.00    Additional pack years: 0.00    Total pack years: 10.00    Types: Cigarettes   Smokeless tobacco: Never  Vaping Use   Vaping Use: Never used  Substance and Sexual Activity   Alcohol use: Not Currently   Drug use: No   Sexual activity: Not on file  Other Topics Concern   Not on file  Social History Narrative   Live with husband 2 dogs and 4 cats   Right handed   Caffeine- 24oz-36oz daily   Social Determinants of Health   Financial Resource Strain: Not on file  Food Insecurity: No Food Insecurity (11/16/2022)   Hunger Vital Sign    Worried About Running Out of Food in the Last Year: Never true    Ran Out of Food in the Last Year: Never true  Transportation Needs: No Transportation Needs (11/16/2022)   PRAPARE - Transportation    Lack of Transportation (Medical): No    Lack of Transportation (Non-Medical): No  Physical Activity: Not on file  Stress: Not on file  Social Connections: Not on file   Family History  Problem Relation Age of Onset   Hypertension Mother    Hypertension Father    Cancer Father        prostate   Heart disease Father    Hypertension Brother    Hypertension Brother    Allergies  Allergen Reactions   Coconut Flavor Swelling   Tramadol Other (See Comments)    Possible seizures   Aleve [Naproxen] Other (See Comments)    Ulcers   Pineapple Other (See Comments)    Migraine   Prior to Admission medications   Medication Sig Start Date End Date Taking? Authorizing Provider  albuterol (VENTOLIN HFA) 108 (90 Base) MCG/ACT inhaler Inhale 1-2 puffs into the lungs every 4 (four) hours as needed for wheezing or shortness of breath.    [provider]  amitriptyline (ELAVIL) 25 MG tablet Take 50 mg by mouth at bedtime.    [provider]  dexlansoprazole (DEXILANT) 60 MG capsule Take 60 mg by mouth daily.    [provider]  diclofenac Sodium (VOLTAREN) 1 % GEL Apply 2 g topically daily as needed (Pain). 02/12/22   [provider]  divalproex (DEPAKOTE ER) 500 MG 24 hr tablet Take 2 tablets (1,000 mg total) by mouth at bedtime. 07/18/22   Yan, Yijun, MD  folic acid (FOLVITE) 1 MG tablet Take 1 mg by mouth daily. 04/15/22   [provider]  HYDROcodone-acetaminophen (NORCO) 10-325 MG tablet Take 1 tablet by mouth every 6 (six) hours as needed for moderate pain or severe pain. 07/15/22   [provider]  hydrOXYzine (ATARAX) 25 MG tablet Take 25 mg by mouth 4 (four) times daily as needed. 07/16/22   [provider]  lamoTRIgine (LAMICTAL) 100 MG tablet Take 1 tablet (100 mg total) by mouth 2 (two) times daily. 10/29/22     Yan, Yijun, MD  lamoTRIgine (LAMICTAL) 25 MG tablet 1 twice a day x1wk 2 twice a day x 2nd wk 3 twice a day x3rd wk 10/29/22   Yan, Yijun, MD  linaclotide (LINZESS) 145 MCG CAPS capsule Take 145 mcg by mouth daily before breakfast.    [provider]  Milnacipran (SAVELLA) 50 MG TABS tablet Take 50 mg by mouth 2 (two) times daily.    [provider]  naloxone (NARCAN) nasal spray 4 mg/0.1 mL Place 1 spray into the nose once. 01/16/22   [provider]  promethazine (PHENERGAN) 25 MG tablet Take 25 mg by mouth every 4 (four) hours as needed for nausea or vomiting.     [provider]  rizatriptan (MAXALT) 10 MG tablet Take 10 mg by mouth as needed for migraine. May repeat in 2 hours if needed    [provider]  tiZANidine (ZANAFLEX) 2 MG tablet Take 2 mg by mouth 2 (two) times daily.    [provider]   CT Head Wo Contrast  Result Date: 11/15/2022 CLINICAL DATA:  Multiple falls EXAM: CT HEAD WITHOUT CONTRAST CT CERVICAL SPINE WITHOUT CONTRAST TECHNIQUE: Multidetector CT imaging of the head and cervical spine  was performed following the standard protocol without intravenous contrast. Multiplanar CT image reconstructions of the cervical spine were also generated. RADIATION DOSE REDUCTION: This exam was performed according to the departmental dose-optimization program which includes automated exposure control, adjustment of the mA and/or kV according to patient size and/or use of iterative reconstruction technique. COMPARISON:  11/08/2022 FINDINGS: CT HEAD FINDINGS Brain: No evidence of acute infarction, hemorrhage, hydrocephalus, extra-axial collection or mass lesion/mass effect. Vascular: No hyperdense vessel or unexpected calcification. Skull: Normal. Negative for fracture or focal lesion. Sinuses/Orbits: No acute finding. Other: None. CT CERVICAL SPINE FINDINGS Alignment: Normal. Skull base and vertebrae: No acute fracture. No primary bone lesion or focal pathologic process. Soft tissues and spinal canal: No prevertebral fluid or swelling. No visible canal hematoma. Disc levels: Anterior cervical discectomy and fusion at C6-C7. Otherwise intact disc spaces. Upper chest: Negative. Other: None. IMPRESSION: 1. No acute intracranial pathology. 2. No fracture or static subluxation of the cervical spine. 3. Anterior cervical discectomy and fusion at C6-C7. Electronically Signed   By: Alex D Bibbey M.D.   On: 11/15/2022 19:35   CT Cervical Spine Wo Contrast  Result Date: 11/15/2022 CLINICAL DATA:  Multiple falls EXAM: CT HEAD WITHOUT CONTRAST CT CERVICAL SPINE WITHOUT CONTRAST TECHNIQUE: Multidetector CT imaging of the head and cervical spine was performed following the standard protocol without intravenous contrast. Multiplanar CT image reconstructions of the cervical spine were also generated. RADIATION DOSE REDUCTION: This exam was performed according to the departmental dose-optimization program which includes automated exposure control, adjustment of the mA and/or kV according to patient size and/or use of iterative  reconstruction technique. COMPARISON:  11/08/2022 FINDINGS: CT HEAD FINDINGS Brain: No evidence of acute infarction, hemorrhage, hydrocephalus, extra-axial collection or mass lesion/mass effect. Vascular: No hyperdense vessel or unexpected calcification. Skull: Normal. Negative for fracture or focal lesion. Sinuses/Orbits: No acute finding. Other: None. CT CERVICAL SPINE FINDINGS Alignment: Normal. Skull base and vertebrae: No acute fracture. No primary bone lesion or focal pathologic process. Soft tissues and spinal canal: No prevertebral fluid or swelling. No visible canal hematoma. Disc levels: Anterior cervical discectomy and fusion at C6-C7. Otherwise intact disc spaces. Upper chest: Negative. Other: None. IMPRESSION: 1. No acute intracranial pathology. 2. No fracture or static subluxation of the cervical spine. 3.   Anterior cervical discectomy and fusion at C6-C7. Electronically Signed   By: Alex D Bibbey M.D.   On: 11/15/2022 19:35   DG Knee 2 Views Left  Result Date: 11/15/2022 CLINICAL DATA:  Fall 2 days ago EXAM: LEFT KNEE - 1-2 VIEW COMPARISON:  None Available. FINDINGS: No evidence of fracture, dislocation, or joint effusion. No evidence of arthropathy or other focal bone abnormality. Soft tissues are unremarkable. IMPRESSION: No fracture or dislocation of the left knee. Electronically Signed   By: Alex D Bibbey M.D.   On: 11/15/2022 17:48   DG Hip Unilat With Pelvis 2-3 Views Left  Result Date: 11/15/2022 CLINICAL DATA:  Fall EXAM: DG HIP (WITH OR WITHOUT PELVIS) 2-3V LEFT COMPARISON:  11/08/2022 FINDINGS: Hardware in the lumbar spine. Pubic symphysis and rami appear intact. Acute left femoral neck fracture with cranial displacement of the distal femoral fracture fragment. Femoral head projects in joint. IMPRESSION: Acute displaced left femoral neck fracture. Electronically Signed   By: Kim  Fujinaga M.D.   On: 11/15/2022 17:48   Family History Reviewed and non-contributory, no pertinent  history of problems with bleeding or anesthesia      Review of Systems 14 system ROS conducted and negative except for that noted in HPI   OBJECTIVE  Vitals:Patient Vitals for the past 8 hrs:  BP Temp Temp src Pulse Resp SpO2  11/16/22 0741 (!) 158/75 99.7 F (37.6 C) Oral (!) 102 16 99 %  11/16/22 0447 (!) 146/76 98.4 F (36.9 C) -- (!) 110 17 98 %   General: Alert, no acute distress Cardiovascular: Warm extremities noted Respiratory: No cyanosis, no use of accessory musculature GI: No organomegaly, abdomen is soft and non-tender Skin: No lesions in the area of chief complaint other than those listed below in MSK exam.  Neurologic: Sensation intact distally save for the below mentioned MSK exam Psychiatric: Patient is competent for consent with normal mood and affect Lymphatic: No swelling obvious and reported other than the area involved in the exam below  Extremities  Bilateral upper extremities: actively moves upper extremities without pain. Non-tender to palpation throughout. NVI  RLE: No swelling, deformity, or effusion. Skin intact. Nontender to palpation, with full and painless ROM throughout. + GS/TA/EHL. Sensation intact in DP/SP/S/S/P distributions. 2+ DP pulse with warm and well perfused digits. Compartments soft and compressible, with no pain on passive stretch.  LLE:Shortened and externally rotated.  ROM deferred. + GS/TA/EHL. Sensation intact in DP/SP/S/S/P distributions. 2+ DP pulse with warm and well perfused digits. Compartments soft and compressible, with no pain on passive stretch.    Test Results Imaging Xrays of left hip demonstrate displaced left femoral neck fracture  Labs cbc Recent Labs    11/15/22 1901 11/16/22 0159  WBC 8.6 10.5  HGB 13.3 12.8  HCT 40.1 38.7  PLT 387 393    Labs inflam No results for input(s): "CRP" in the last 72 hours.  Invalid input(s): "ESR"  Labs coag No results for input(s): "INR", "PTT" in the last 72  hours.  Invalid input(s): "PT"  Recent Labs    11/15/22 1901 11/16/22 0159  NA 138 136  K 2.9* 5.0  CL 97* 101  CO2 22 20*  GLUCOSE 76 90  BUN 19 14  CREATININE 0.77 0.89  CALCIUM 8.9 8.6*     ASSESSMENT AND PLAN: 52 y.o. female with the following: left femoral neck fracture  Orthopedics recommends admission to a medical service and we will provide consultation and follow along.  Discussed the nature   of the injury as well as the care with the patient as well as the family.  Discussed options and non-operative versus operative measures. Nonoperative measures are not well tolerated as patient's on bedrest for extended periods of time tend to develop secondary issues such as pneumonia, urinary tract infections, bedsores and delirium.  Based on this our recommendation is for operative measures.  Understanding this the patient/family elected to proceed with operative measures.  The risks and benefits of  surgical intervention including infection, bleeding, nerve injury, periprosthetic fracture, the need for revision surgery, leg length discrepancy, gait change, blood clots, cardiopulmonary complications, morbidity, mortality, among others, and they were willing to proceed.    - Plan: Operative fixation tomorrow am with Dr. Murphy - NPO at midnight  -Medicine team to admit and perform pre-op clearance - Weight Bearing Status/Activity: will ammend WB status postop, bedrest for now - PT/OT post op - VTE Prophylaxis: SCDs for now - Pain control: PRN pain medications - Dispo: Likely to require Rehab or SNF placement upon discharge.  - Contact information: After hours and holidays please check [email or web address] for group call information for [facility]   Eugine Bubb, PA-C 11/16/2022 11:42 AM  

## 2022-11-16 NOTE — H&P (View-Only) (Signed)
ORTHOPAEDIC CONSULTATION  REQUESTING PHYSICIAN: Rodolph Bong, MD  Chief Complaint: left hip fracture  HPI: Sharon Welch is a 52 y.o. female with medical history significant of seizure disorder, fibromyalgia, GERD, anxiety, and more presents the ED with a chief complaint of fall 3 days ago. She was going to have an EEG monitor placed for 24 hours, so she did not want to miss that by going to the hospital when she fell on Wednesday. Patient was taken to the ED. Orthopedic work-up significant for left femoral neck fracture. Orthopedics consulted.  Patient seen on 5N. Complaining of pain in her left hip. Improved with medications. She cannot find a comfortable position. She normally ambulates without assistance. She lives at home with her husband.   Past Medical History:  Diagnosis Date   Bruising, spontaneous 12/08/2013   Cancer (HCC)    cervical cancer - had hysterectomy   Chronic back pain    Chronic back pain    DDD (degenerative disc disease), lumbar    DDD (degenerative disc disease), lumbar    Fibromyalgia    Headache(784.0)    migraines   Ovarian cyst    Partial tear subscapularis tendon 12/27/2011   Scoliosis    Seizures (HCC)    started 9/12-   Past Surgical History:  Procedure Laterality Date   ABDOMINAL HYSTERECTOMY  2012   BACK SURGERY  1989   scoliosis throsic-rods   CESAREAN SECTION     CHOLECYSTECTOMY N/A 11/17/2012   Procedure: LAPAROSCOPIC CHOLECYSTECTOMY WITH INTRAOPERATIVE CHOLANGIOGRAM;  Surgeon: Axel Filler, MD;  Location: [facility];  Service: General;  Laterality: N/A;   COLONOSCOPY WITH PROPOFOL N/A 04/21/2020   Procedure: COLONOSCOPY WITH PROPOFOL;  Surgeon: Jeani Hawking, MD;  Location: [facility];  Service: Endoscopy;  Laterality: N/A;   ENDOSCOPIC RETROGRADE CHOLANGIOPANCREATOGRAPHY (ERCP) WITH PROPOFOL N/A 06/11/2019   Procedure: ENDOSCOPIC RETROGRADE CHOLANGIOPANCREATOGRAPHY (ERCP) WITH PROPOFOL;  Surgeon: Jeani Hawking, MD;  Location: [facility];  Service: Endoscopy;  Laterality: N/A;   ERCP N/A 05/07/2013   Procedure: ENDOSCOPIC RETROGRADE CHOLANGIOPANCREATOGRAPHY (ERCP);  Surgeon: Theda Belfast, MD;  Location: [facility];  Service: Endoscopy;  Laterality: N/A;  start ercp first, if canulation fails switch to EUS    ESOPHAGOGASTRODUODENOSCOPY (EGD) WITH PROPOFOL N/A 05/15/2019   Procedure: ESOPHAGOGASTRODUODENOSCOPY (EGD) WITH PROPOFOL;  Surgeon: West Bali, MD;  Location: [facility];  Service: Endoscopy;  Laterality: N/A;   ESOPHAGOGASTRODUODENOSCOPY (EGD) WITH PROPOFOL N/A 03/03/2020   Procedure: ESOPHAGOGASTRODUODENOSCOPY (EGD) WITH PROPOFOL;  Surgeon: Jeani Hawking, MD;  Location: [facility];  Service: Endoscopy;  Laterality: N/A;   ESOPHAGOGASTRODUODENOSCOPY (EGD) WITH PROPOFOL N/A 04/21/2020   Procedure: ESOPHAGOGASTRODUODENOSCOPY (EGD) WITH PROPOFOL;  Surgeon: Jeani Hawking, MD;  Location: [facility];  Service: Endoscopy;  Laterality: N/A;   EUS N/A 11/03/2012   Procedure: UPPER ENDOSCOPIC ULTRASOUND (EUS) LINEAR;  Surgeon: Theda Belfast, MD;  Location: [facility];  Service: Endoscopy;  Laterality: N/A;   left arm     NECK SURGERY     POLYPECTOMY  04/21/2020   Procedure: POLYPECTOMY;  Surgeon: Jeani Hawking, MD;  Location: [facility];  Service: Endoscopy;;   REMOVAL OF STONES  06/11/2019   Procedure: REMOVAL OF STONES;  Surgeon: Jeani Hawking, MD;  Location: [facility];  Service: Endoscopy;;   TOOTH EXTRACTION N/A 05/24/2022   Procedure: DENTAL RESTORATION/EXTRACTIONS;  Surgeon: Ocie Doyne, DMD;  Location: [facility];  Service: Oral Surgery;  Laterality: N/A;   TUBAL LIGATION     UPPER ESOPHAGEAL ENDOSCOPIC ULTRASOUND (EUS)  N/A 06/11/2019   Procedure: UPPER ESOPHAGEAL ENDOSCOPIC ULTRASOUND (EUS);  Surgeon: Jeani Hawking, MD;  Location: [facility];  Service: Endoscopy;  Laterality: N/A;   Social History   Socioeconomic History   Marital status: Married    Spouse name: Not on file   Number of  children: Not on file   Years of education: Not on file   Highest education level: Not on file  Occupational History   Not on file  Tobacco Use   Smoking status: Every Day    Packs/day: 0.50    Years: 20.00    Additional pack years: 0.00    Total pack years: 10.00    Types: Cigarettes   Smokeless tobacco: Never  Vaping Use   Vaping Use: Never used  Substance and Sexual Activity   Alcohol use: Not Currently   Drug use: No   Sexual activity: Not on file  Other Topics Concern   Not on file  Social History Narrative   Live with husband 2 dogs and 4 cats   Right handed   Caffeine- 24oz-36oz daily   Social Determinants of Health   Financial Resource Strain: Not on file  Food Insecurity: No Food Insecurity (11/16/2022)   Hunger Vital Sign    Worried About Running Out of Food in the Last Year: Never true    Ran Out of Food in the Last Year: Never true  Transportation Needs: No Transportation Needs (11/16/2022)   PRAPARE - Administrator, Civil Service (Medical): No    Lack of Transportation (Non-Medical): No  Physical Activity: Not on file  Stress: Not on file  Social Connections: Not on file   Family History  Problem Relation Age of Onset   Hypertension Mother    Hypertension Father    Cancer Father        prostate   Heart disease Father    Hypertension Brother    Hypertension Brother    Allergies  Allergen Reactions   Coconut Flavor Swelling   Tramadol Other (See Comments)    Possible seizures   Aleve [Naproxen] Other (See Comments)    Ulcers   Pineapple Other (See Comments)    Migraine   Prior to Admission medications   Medication Sig Start Date End Date Taking? Authorizing Provider  albuterol (VENTOLIN HFA) 108 (90 Base) MCG/ACT inhaler Inhale 1-2 puffs into the lungs every 4 (four) hours as needed for wheezing or shortness of breath.    [provider]  amitriptyline (ELAVIL) 25 MG tablet Take 50 mg by mouth at bedtime.    [provider]  dexlansoprazole (DEXILANT) 60 MG capsule Take 60 mg by mouth daily.    [provider]  diclofenac Sodium (VOLTAREN) 1 % GEL Apply 2 g topically daily as needed (Pain). 02/12/22   [provider]  divalproex (DEPAKOTE ER) 500 MG 24 hr tablet Take 2 tablets (1,000 mg total) by mouth at bedtime. 07/18/22   Levert Feinstein, MD  folic acid (FOLVITE) 1 MG tablet Take 1 mg by mouth daily. 04/15/22   [provider]  HYDROcodone-acetaminophen (NORCO) 10-325 MG tablet Take 1 tablet by mouth every 6 (six) hours as needed for moderate pain or severe pain. 07/15/22   [provider]  hydrOXYzine (ATARAX) 25 MG tablet Take 25 mg by mouth 4 (four) times daily as needed. 07/16/22   [provider]  lamoTRIgine (LAMICTAL) 100 MG tablet Take 1 tablet (100 mg total) by mouth 2 (two) times daily. 10/29/22  Levert Feinstein, MD  lamoTRIgine (LAMICTAL) 25 MG tablet 1 twice a day x1wk 2 twice a day x 2nd wk 3 twice a day x3rd wk 10/29/22   Levert Feinstein, MD  linaclotide [facility]) 145 MCG CAPS capsule Take 145 mcg by mouth daily before breakfast.    [provider]  Milnacipran (SAVELLA) 50 MG TABS tablet Take 50 mg by mouth 2 (two) times daily.    [provider]  naloxone [facility]) nasal spray 4 mg/0.1 mL Place 1 spray into the nose once. 01/16/22   [provider]  promethazine (PHENERGAN) 25 MG tablet Take 25 mg by mouth every 4 (four) hours as needed for nausea or vomiting.     [provider]  rizatriptan (MAXALT) 10 MG tablet Take 10 mg by mouth as needed for migraine. May repeat in 2 hours if needed    [provider]  tiZANidine (ZANAFLEX) 2 MG tablet Take 2 mg by mouth 2 (two) times daily.    [provider]   CT Head Wo Contrast  Result Date: 11/15/2022 CLINICAL DATA:  Multiple falls EXAM: CT HEAD WITHOUT CONTRAST CT CERVICAL SPINE WITHOUT CONTRAST TECHNIQUE: Multidetector CT imaging of the head and cervical spine  was performed following the standard protocol without intravenous contrast. Multiplanar CT image reconstructions of the cervical spine were also generated. RADIATION DOSE REDUCTION: This exam was performed according to the departmental dose-optimization program which includes automated exposure control, adjustment of the mA and/or kV according to patient size and/or use of iterative reconstruction technique. COMPARISON:  11/08/2022 FINDINGS: CT HEAD FINDINGS Brain: No evidence of acute infarction, hemorrhage, hydrocephalus, extra-axial collection or mass lesion/mass effect. Vascular: No hyperdense vessel or unexpected calcification. Skull: Normal. Negative for fracture or focal lesion. Sinuses/Orbits: No acute finding. Other: None. CT CERVICAL SPINE FINDINGS Alignment: Normal. Skull base and vertebrae: No acute fracture. No primary bone lesion or focal pathologic process. Soft tissues and spinal canal: No prevertebral fluid or swelling. No visible canal hematoma. Disc levels: Anterior cervical discectomy and fusion at C6-C7. Otherwise intact disc spaces. Upper chest: Negative. Other: None. IMPRESSION: 1. No acute intracranial pathology. 2. No fracture or static subluxation of the cervical spine. 3. Anterior cervical discectomy and fusion at C6-C7. Electronically Signed   By: Jearld Lesch M.D.   On: 11/15/2022 19:35   CT Cervical Spine Wo Contrast  Result Date: 11/15/2022 CLINICAL DATA:  Multiple falls EXAM: CT HEAD WITHOUT CONTRAST CT CERVICAL SPINE WITHOUT CONTRAST TECHNIQUE: Multidetector CT imaging of the head and cervical spine was performed following the standard protocol without intravenous contrast. Multiplanar CT image reconstructions of the cervical spine were also generated. RADIATION DOSE REDUCTION: This exam was performed according to the departmental dose-optimization program which includes automated exposure control, adjustment of the mA and/or kV according to patient size and/or use of iterative  reconstruction technique. COMPARISON:  11/08/2022 FINDINGS: CT HEAD FINDINGS Brain: No evidence of acute infarction, hemorrhage, hydrocephalus, extra-axial collection or mass lesion/mass effect. Vascular: No hyperdense vessel or unexpected calcification. Skull: Normal. Negative for fracture or focal lesion. Sinuses/Orbits: No acute finding. Other: None. CT CERVICAL SPINE FINDINGS Alignment: Normal. Skull base and vertebrae: No acute fracture. No primary bone lesion or focal pathologic process. Soft tissues and spinal canal: No prevertebral fluid or swelling. No visible canal hematoma. Disc levels: Anterior cervical discectomy and fusion at C6-C7. Otherwise intact disc spaces. Upper chest: Negative. Other: None. IMPRESSION: 1. No acute intracranial pathology. 2. No fracture or static subluxation of the cervical spine. 3.  Anterior cervical discectomy and fusion at C6-C7. Electronically Signed   By: Jearld Lesch M.D.   On: 11/15/2022 19:35   DG Knee 2 Views Left  Result Date: 11/15/2022 CLINICAL DATA:  Fall 2 days ago EXAM: LEFT KNEE - 1-2 VIEW COMPARISON:  None Available. FINDINGS: No evidence of fracture, dislocation, or joint effusion. No evidence of arthropathy or other focal bone abnormality. Soft tissues are unremarkable. IMPRESSION: No fracture or dislocation of the left knee. Electronically Signed   By: Jearld Lesch M.D.   On: 11/15/2022 17:48   DG Hip Unilat With Pelvis 2-3 Views Left  Result Date: 11/15/2022 CLINICAL DATA:  Fall EXAM: DG HIP (WITH OR WITHOUT PELVIS) 2-3V LEFT COMPARISON:  11/08/2022 FINDINGS: Hardware in the lumbar spine. Pubic symphysis and rami appear intact. Acute left femoral neck fracture with cranial displacement of the distal femoral fracture fragment. Femoral head projects in joint. IMPRESSION: Acute displaced left femoral neck fracture. Electronically Signed   By: Jasmine Pang M.D.   On: 11/15/2022 17:48   Family History Reviewed and non-contributory, no pertinent  history of problems with bleeding or anesthesia      Review of Systems 14 system ROS conducted and negative except for that noted in HPI   OBJECTIVE  Vitals:Patient Vitals for the past 8 hrs:  BP Temp Temp src Pulse Resp SpO2  11/16/22 0741 (!) 158/75 99.7 F (37.6 C) Oral (!) 102 16 99 %  11/16/22 0447 (!) 146/76 98.4 F (36.9 C) -- (!) 110 17 98 %   General: Alert, no acute distress Cardiovascular: Warm extremities noted Respiratory: No cyanosis, no use of accessory musculature GI: No organomegaly, abdomen is soft and non-tender Skin: No lesions in the area of chief complaint other than those listed below in MSK exam.  Neurologic: Sensation intact distally save for the below mentioned MSK exam Psychiatric: Patient is competent for consent with normal mood and affect Lymphatic: No swelling obvious and reported other than the area involved in the exam below  Extremities  Bilateral upper extremities: actively moves upper extremities without pain. Non-tender to palpation throughout. NVI  RLE: No swelling, deformity, or effusion. Skin intact. Nontender to palpation, with full and painless ROM throughout. + GS/TA/EHL. Sensation intact in DP/SP/S/S/P distributions. 2+ DP pulse with warm and well perfused digits. Compartments soft and compressible, with no pain on passive stretch.  GLO:VFIEPPIRJ and externally rotated.  ROM deferred. + GS/TA/EHL. Sensation intact in DP/SP/S/S/P distributions. 2+ DP pulse with warm and well perfused digits. Compartments soft and compressible, with no pain on passive stretch.    Test Results Imaging Xrays of left hip demonstrate displaced left femoral neck fracture  Labs cbc Recent Labs    11/15/22 1901 11/16/22 0159  WBC 8.6 10.5  HGB 13.3 12.8  HCT 40.1 38.7  PLT 387 393    Labs inflam No results for input(s): "CRP" in the last 72 hours.  Invalid input(s): "ESR"  Labs coag No results for input(s): "INR", "PTT" in the last 72  hours.  Invalid input(s): "PT"  Recent Labs    11/15/22 1901 11/16/22 0159  NA 138 136  K 2.9* 5.0  CL 97* 101  CO2 22 20*  GLUCOSE 76 90  BUN 19 14  CREATININE 0.77 0.89  CALCIUM 8.9 8.6*     ASSESSMENT AND PLAN: 52 y.o. female with the following: left femoral neck fracture  Orthopedics recommends admission to a medical service and we will provide consultation and follow along.  Discussed the nature  of the injury as well as the care with the patient as well as the family.  Discussed options and non-operative versus operative measures. Nonoperative measures are not well tolerated as patient's on bedrest for extended periods of time tend to develop secondary issues such as pneumonia, urinary tract infections, bedsores and delirium.  Based on this our recommendation is for operative measures.  Understanding this the patient/family elected to proceed with operative measures.  The risks and benefits of  surgical intervention including infection, bleeding, nerve injury, periprosthetic fracture, the need for revision surgery, leg length discrepancy, gait change, blood clots, cardiopulmonary complications, morbidity, mortality, among others, and they were willing to proceed.    - Plan: Operative fixation tomorrow am with Dr. [name] - NPO at midnight  -Medicine team to admit and perform pre-op clearance - Weight Bearing Status/Activity: will ammend WB status postop, bedrest for now - PT/OT post op - VTE Prophylaxis: SCDs for now - Pain control: PRN pain medications - Dispo: Likely to require Rehab or SNF placement upon discharge.  - Contact information: After hours and holidays please check [email or web address] for group call information for [facility]   Alfonse Alpers, PA-C 11/16/2022 11:42 AM

## 2022-11-16 NOTE — Plan of Care (Signed)
Called by Dr Janee Morn for advice about seizures. Patient had a hip fracture after a fall after presumed seizure. 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 She is supposed to be on Lamictal and Depakote but not taking depakote. For now - OK to continue Lamictal. Maintain seizure precautions. Call us if further episodes inpatient. Will consider LTM EEG if she has more spells to characterize them before rushing to treat as seizures. Follow up with outpatient neurology and follow their plans as advised.  -- Milon Dikes, MD Neurologist [facility] Pager: [phone]

## 2022-11-17 ENCOUNTER — Other Ambulatory Visit: Payer: Self-pay

## 2022-11-17 ENCOUNTER — Inpatient Hospital Stay (HOSPITAL_COMMUNITY): Payer: 59 | Admitting: Certified Registered"

## 2022-11-17 ENCOUNTER — Inpatient Hospital Stay (HOSPITAL_COMMUNITY): Payer: 59

## 2022-11-17 ENCOUNTER — Encounter (HOSPITAL_COMMUNITY)
Admission: EM | Disposition: A | Discharge status: Home Health | Payer: Self-pay | Source: Home / Self Care | Attending: Internal Medicine

## 2022-11-17 DIAGNOSIS — G40909 Epilepsy, unspecified, not intractable, without status epilepticus: Secondary | ICD-10-CM | POA: Diagnosis not present

## 2022-11-17 DIAGNOSIS — E876 Hypokalemia: Secondary | ICD-10-CM | POA: Diagnosis not present

## 2022-11-17 DIAGNOSIS — F17210 Nicotine dependence, cigarettes, uncomplicated: Secondary | ICD-10-CM

## 2022-11-17 DIAGNOSIS — S72002A Fracture of unspecified part of neck of left femur, initial encounter for closed fracture: Secondary | ICD-10-CM

## 2022-11-17 DIAGNOSIS — G709 Myoneural disorder, unspecified: Secondary | ICD-10-CM

## 2022-11-17 DIAGNOSIS — K219 Gastro-esophageal reflux disease without esophagitis: Secondary | ICD-10-CM | POA: Diagnosis not present

## 2022-11-17 DIAGNOSIS — S72002P Fracture of unspecified part of neck of left femur, subsequent encounter for closed fracture with malunion: Secondary | ICD-10-CM | POA: Diagnosis not present

## 2022-11-17 HISTORY — PX: TOTAL HIP ARTHROPLASTY: SHX124

## 2022-11-17 LAB — CBC WITH DIFFERENTIAL/PLATELET
Abs Immature Granulocytes: 0.07 10*3/uL (ref 0.00–0.07)
Basophils Absolute: 0.1 10*3/uL (ref 0.0–0.1)
Basophils Relative: 1 %
Eosinophils Absolute: 0.1 10*3/uL (ref 0.0–0.5)
Eosinophils Relative: 1 %
HCT: 36.6 % (ref 36.0–46.0)
Hemoglobin: 11.8 g/dL — ABNORMAL LOW (ref 12.0–15.0)
Immature Granulocytes: 1 %
Lymphocytes Relative: 20 %
Lymphs Abs: 1.4 10*3/uL (ref 0.7–4.0)
MCH: 31.1 pg (ref 26.0–34.0)
MCHC: 32.2 g/dL (ref 30.0–36.0)
MCV: 96.6 fL (ref 80.0–100.0)
Monocytes Absolute: 1.5 10*3/uL — ABNORMAL HIGH (ref 0.1–1.0)
Monocytes Relative: 22 %
Neutro Abs: 3.9 10*3/uL (ref 1.7–7.7)
Neutrophils Relative %: 55 %
Platelets: 302 10*3/uL (ref 150–400)
RBC: 3.79 MIL/uL — ABNORMAL LOW (ref 3.87–5.11)
RDW: 14.8 % (ref 11.5–15.5)
WBC: 7.1 10*3/uL (ref 4.0–10.5)
nRBC: 0 % (ref 0.0–0.2)

## 2022-11-17 LAB — COMPREHENSIVE METABOLIC PANEL
ALT: 81 U/L — ABNORMAL HIGH (ref 0–44)
AST: 100 U/L — ABNORMAL HIGH (ref 15–41)
Albumin: 2.5 g/dL — ABNORMAL LOW (ref 3.5–5.0)
Alkaline Phosphatase: 1228 U/L — ABNORMAL HIGH (ref 38–126)
Anion gap: 11 (ref 5–15)
BUN: 8 mg/dL (ref 6–20)
CO2: 23 mmol/L (ref 22–32)
Calcium: 8.1 mg/dL — ABNORMAL LOW (ref 8.9–10.3)
Chloride: 101 mmol/L (ref 98–111)
Creatinine, Ser: 0.74 mg/dL (ref 0.44–1.00)
GFR, Estimated: >60 mL/min (ref >60)
Glucose, Bld: 68 mg/dL — ABNORMAL LOW (ref 70–99)
Potassium: 3.7 mmol/L (ref 3.5–5.1)
Sodium: 135 mmol/L (ref 135–145)
Total Bilirubin: 1.1 mg/dL (ref 0.3–1.2)
Total Protein: 5.3 g/dL — ABNORMAL LOW (ref 6.5–8.1)

## 2022-11-17 LAB — TYPE AND SCREEN
ABO/RH(D): B POS
Antibody Screen: NEGATIVE

## 2022-11-17 LAB — MAGNESIUM: Magnesium: 1.8 mg/dL (ref 1.7–2.4)

## 2022-11-17 LAB — GLUCOSE, CAPILLARY
Glucose-Capillary: 70 mg/dL (ref 70–99)
Glucose-Capillary: 95 mg/dL (ref 70–99)

## 2022-11-17 LAB — CK: Total CK: 32 U/L — ABNORMAL LOW (ref 38–234)

## 2022-11-17 LAB — SURGICAL PCR SCREEN
MRSA, PCR: NEGATIVE
Staphylococcus aureus: NEGATIVE

## 2022-11-17 SURGERY — ARTHROPLASTY, HIP, TOTAL, ANTERIOR APPROACH
Anesthesia: General | Site: Hip | Laterality: Left

## 2022-11-17 MED ORDER — ACETAMINOPHEN 10 MG/ML IV SOLN
INTRAVENOUS | Status: AC
  Filled 2022-11-17: qty 100

## 2022-11-17 MED ORDER — OXYCODONE HCL 5 MG PO TABS
5.0000 mg | ORAL_TABLET | ORAL | Status: DC | PRN
  Administered 2022-11-18 – 2022-11-19 (×3): 10 mg via ORAL
  Filled 2022-11-17 (×2): qty 2

## 2022-11-17 MED ORDER — CHLORHEXIDINE GLUCONATE 0.12 % MT SOLN: 15.0000 mL | Freq: Once | OROMUCOSAL | Status: DC

## 2022-11-17 MED ORDER — ORAL CARE MOUTH RINSE: 15.0000 mL | Freq: Once | OROMUCOSAL | Status: DC

## 2022-11-17 MED ORDER — DEXMEDETOMIDINE HCL IN NACL 80 MCG/20ML IV SOLN
INTRAVENOUS | Status: DC | PRN
  Administered 2022-11-17 (×2): 4 ug via INTRAVENOUS

## 2022-11-17 MED ORDER — FENTANYL CITRATE (PF) 100 MCG/2ML IJ SOLN
25.0000 ug | INTRAMUSCULAR | Status: DC | PRN
  Administered 2022-11-17 (×3): 50 ug via INTRAVENOUS

## 2022-11-17 MED ORDER — ACETAMINOPHEN 10 MG/ML IV SOLN
650.0000 mg | Freq: Once | INTRAVENOUS | Status: DC | PRN
  Administered 2022-11-17: 650 mg via INTRAVENOUS

## 2022-11-17 MED ORDER — 0.9 % SODIUM CHLORIDE (POUR BTL) OPTIME
TOPICAL | Status: DC | PRN
  Administered 2022-11-17: 1000 mL

## 2022-11-17 MED ORDER — OXYCODONE HCL 5 MG/5ML PO SOLN: 5.0000 mg | Freq: Once | ORAL | Status: AC | PRN

## 2022-11-17 MED ORDER — PROMETHAZINE HCL 25 MG/ML IJ SOLN: 6.2500 mg | INTRAMUSCULAR | Status: DC | PRN

## 2022-11-17 MED ORDER — MAGNESIUM CITRATE PO SOLN: 1.0000 | Freq: Once | ORAL | Status: DC | PRN

## 2022-11-17 MED ORDER — CEFAZOLIN SODIUM-DEXTROSE 1-4 GM/50ML-% IV SOLN
1.0000 g | Freq: Four times a day (QID) | INTRAVENOUS | Status: AC
  Administered 2022-11-17: 1 g via INTRAVENOUS
  Filled 2022-11-17: qty 50

## 2022-11-17 MED ORDER — HYDROMORPHONE HCL 1 MG/ML IJ SOLN
0.5000 mg | INTRAMUSCULAR | Status: AC | PRN
  Administered 2022-11-17 (×2): 0.5 mg via INTRAVENOUS

## 2022-11-17 MED ORDER — OXYCODONE HCL 5 MG PO TABS
10.0000 mg | ORAL_TABLET | ORAL | Status: DC | PRN
  Administered 2022-11-17 – 2022-11-18 (×4): 15 mg via ORAL
  Filled 2022-11-17 (×5): qty 3

## 2022-11-17 MED ORDER — LIDOCAINE 2% (20 MG/ML) 5 ML SYRINGE
INTRAMUSCULAR | Status: DC | PRN
  Administered 2022-11-17: 60 mg via INTRAVENOUS

## 2022-11-17 MED ORDER — ROCURONIUM BROMIDE 10 MG/ML (PF) SYRINGE
PREFILLED_SYRINGE | INTRAVENOUS | Status: DC | PRN
  Administered 2022-11-17: 10 mg via INTRAVENOUS
  Administered 2022-11-17: 40 mg via INTRAVENOUS

## 2022-11-17 MED ORDER — BUPIVACAINE LIPOSOME 1.3 % IJ SUSP
INTRAMUSCULAR | Status: AC
  Filled 2022-11-17: qty 20

## 2022-11-17 MED ORDER — ASPIRIN 81 MG PO CHEW
81.0000 mg | CHEWABLE_TABLET | Freq: Two times a day (BID) | ORAL | Status: DC
  Administered 2022-11-17 – 2022-11-19 (×4): 81 mg via ORAL
  Filled 2022-11-17 (×4): qty 1

## 2022-11-17 MED ORDER — VASOPRESSIN 20 UNIT/ML IV SOLN
INTRAVENOUS | Status: AC
  Filled 2022-11-17: qty 1

## 2022-11-17 MED ORDER — LIDOCAINE 2% (20 MG/ML) 5 ML SYRINGE
INTRAMUSCULAR | Status: AC
  Filled 2022-11-17: qty 5

## 2022-11-17 MED ORDER — METHOCARBAMOL 1000 MG/10ML IJ SOLN: 500.0000 mg | Freq: Four times a day (QID) | INTRAVENOUS | Status: DC | PRN

## 2022-11-17 MED ORDER — ONDANSETRON HCL 4 MG/2ML IJ SOLN
INTRAMUSCULAR | Status: AC
  Filled 2022-11-17: qty 2

## 2022-11-17 MED ORDER — DIPHENHYDRAMINE HCL 12.5 MG/5ML PO ELIX
12.5000 mg | ORAL_SOLUTION | ORAL | Status: DC | PRN
  Administered 2022-11-18 – 2022-11-19 (×2): 25 mg via ORAL
  Filled 2022-11-17 (×2): qty 10

## 2022-11-17 MED ORDER — POLYETHYLENE GLYCOL 3350 17 G PO PACK: 17.0000 g | PACK | Freq: Every day | ORAL | Status: DC | PRN

## 2022-11-17 MED ORDER — AMISULPRIDE (ANTIEMETIC) 5 MG/2ML IV SOLN: 10.0000 mg | Freq: Once | INTRAVENOUS | Status: DC | PRN

## 2022-11-17 MED ORDER — ROCURONIUM BROMIDE 10 MG/ML (PF) SYRINGE
PREFILLED_SYRINGE | INTRAVENOUS | Status: AC
  Filled 2022-11-17: qty 10

## 2022-11-17 MED ORDER — HYDROMORPHONE HCL 1 MG/ML IJ SOLN
INTRAMUSCULAR | Status: AC
  Filled 2022-11-17: qty 0.5

## 2022-11-17 MED ORDER — SUGAMMADEX SODIUM 200 MG/2ML IV SOLN
INTRAVENOUS | Status: DC | PRN
  Administered 2022-11-17: 100 mg via INTRAVENOUS

## 2022-11-17 MED ORDER — ESMOLOL HCL 100 MG/10ML IV SOLN
INTRAVENOUS | Status: AC
  Filled 2022-11-17: qty 10

## 2022-11-17 MED ORDER — ACETAMINOPHEN 500 MG PO TABS
1000.0000 mg | ORAL_TABLET | Freq: Four times a day (QID) | ORAL | Status: AC
  Administered 2022-11-17 – 2022-11-18 (×3): 1000 mg via ORAL
  Filled 2022-11-17 (×3): qty 2

## 2022-11-17 MED ORDER — CEFAZOLIN SODIUM-DEXTROSE 2-4 GM/100ML-% IV SOLN
2.0000 g | INTRAVENOUS | Status: AC
  Administered 2022-11-17: 2 g via INTRAVENOUS

## 2022-11-17 MED ORDER — PHENYLEPHRINE 80 MCG/ML (10ML) SYRINGE FOR IV PUSH (FOR BLOOD PRESSURE SUPPORT)
PREFILLED_SYRINGE | INTRAVENOUS | Status: AC
  Filled 2022-11-17: qty 10

## 2022-11-17 MED ORDER — ONDANSETRON HCL 4 MG/2ML IJ SOLN
INTRAMUSCULAR | Status: DC | PRN
  Administered 2022-11-17: 4 mg via INTRAVENOUS

## 2022-11-17 MED ORDER — TRANEXAMIC ACID-NACL 1000-0.7 MG/100ML-% IV SOLN
INTRAVENOUS | Status: AC
  Filled 2022-11-17: qty 100

## 2022-11-17 MED ORDER — TRANEXAMIC ACID-NACL 1000-0.7 MG/100ML-% IV SOLN
1000.0000 mg | INTRAVENOUS | Status: AC
  Administered 2022-11-17: 1000 mg via INTRAVENOUS

## 2022-11-17 MED ORDER — OXYCODONE HCL 5 MG PO TABS
5.0000 mg | ORAL_TABLET | Freq: Once | ORAL | Status: AC | PRN
  Administered 2022-11-17: 5 mg via ORAL

## 2022-11-17 MED ORDER — BISACODYL 10 MG RE SUPP: 10.0000 mg | Freq: Every day | RECTAL | Status: DC | PRN

## 2022-11-17 MED ORDER — POVIDONE-IODINE 10 % EX SWAB
2.0000 | Freq: Once | CUTANEOUS | Status: AC
  Administered 2022-11-17: 2 via TOPICAL

## 2022-11-17 MED ORDER — PHENYLEPHRINE 80 MCG/ML (10ML) SYRINGE FOR IV PUSH (FOR BLOOD PRESSURE SUPPORT)
PREFILLED_SYRINGE | INTRAVENOUS | Status: DC | PRN
  Administered 2022-11-17 (×3): 40 ug via INTRAVENOUS

## 2022-11-17 MED ORDER — LACTATED RINGERS IV SOLN: INTRAVENOUS | Status: DC

## 2022-11-17 MED ORDER — TRANEXAMIC ACID-NACL 1000-0.7 MG/100ML-% IV SOLN
1000.0000 mg | Freq: Once | INTRAVENOUS | Status: AC
  Administered 2022-11-17: 1000 mg via INTRAVENOUS
  Filled 2022-11-17: qty 100

## 2022-11-17 MED ORDER — PHENOL 1.4 % MT LIQD: 1.0000 | OROMUCOSAL | Status: DC | PRN

## 2022-11-17 MED ORDER — MIDAZOLAM HCL 2 MG/2ML IJ SOLN
INTRAMUSCULAR | Status: DC | PRN
  Administered 2022-11-17: 2 mg via INTRAVENOUS

## 2022-11-17 MED ORDER — DOCUSATE SODIUM 100 MG PO CAPS
100.0000 mg | ORAL_CAPSULE | Freq: Two times a day (BID) | ORAL | Status: DC
  Administered 2022-11-17 – 2022-11-19 (×4): 100 mg via ORAL
  Filled 2022-11-17 (×4): qty 1

## 2022-11-17 MED ORDER — DEXAMETHASONE SODIUM PHOSPHATE 10 MG/ML IJ SOLN
INTRAMUSCULAR | Status: DC | PRN
  Administered 2022-11-17: 10 mg via INTRAVENOUS

## 2022-11-17 MED ORDER — ONDANSETRON HCL 4 MG/2ML IJ SOLN: 4.0000 mg | Freq: Four times a day (QID) | INTRAMUSCULAR | Status: DC | PRN

## 2022-11-17 MED ORDER — HYDROMORPHONE HCL 1 MG/ML IJ SOLN
0.5000 mg | INTRAMUSCULAR | Status: DC | PRN
  Administered 2022-11-17: 1 mg via INTRAVENOUS
  Filled 2022-11-17: qty 1

## 2022-11-17 MED ORDER — ALUM & MAG HYDROXIDE-SIMETH 200-200-20 MG/5ML PO SUSP: 30.0000 mL | ORAL | Status: DC | PRN

## 2022-11-17 MED ORDER — CEFAZOLIN SODIUM-DEXTROSE 2-4 GM/100ML-% IV SOLN
INTRAVENOUS | Status: AC
  Filled 2022-11-17: qty 100

## 2022-11-17 MED ORDER — EPHEDRINE 5 MG/ML INJ
INTRAVENOUS | Status: AC
  Filled 2022-11-17: qty 5

## 2022-11-17 MED ORDER — FENTANYL CITRATE (PF) 100 MCG/2ML IJ SOLN
INTRAMUSCULAR | Status: AC
  Filled 2022-11-17: qty 2

## 2022-11-17 MED ORDER — FENTANYL CITRATE (PF) 250 MCG/5ML IJ SOLN
INTRAMUSCULAR | Status: AC
  Filled 2022-11-17: qty 5

## 2022-11-17 MED ORDER — HYDROMORPHONE HCL 1 MG/ML IJ SOLN
INTRAMUSCULAR | Status: DC | PRN
  Administered 2022-11-17 (×2): .5 mg via INTRAVENOUS

## 2022-11-17 MED ORDER — PHENYLEPHRINE HCL-NACL 20-0.9 MG/250ML-% IV SOLN
INTRAVENOUS | Status: AC
  Filled 2022-11-17: qty 250

## 2022-11-17 MED ORDER — METOCLOPRAMIDE HCL 5 MG/ML IJ SOLN: 5.0000 mg | Freq: Three times a day (TID) | INTRAMUSCULAR | Status: DC | PRN

## 2022-11-17 MED ORDER — METHOCARBAMOL 500 MG PO TABS
500.0000 mg | ORAL_TABLET | Freq: Four times a day (QID) | ORAL | Status: DC | PRN
  Administered 2022-11-17 – 2022-11-19 (×7): 500 mg via ORAL
  Filled 2022-11-17 (×7): qty 1

## 2022-11-17 MED ORDER — PROPOFOL 10 MG/ML IV BOLUS
INTRAVENOUS | Status: DC | PRN
  Administered 2022-11-17: 200 mg via INTRAVENOUS

## 2022-11-17 MED ORDER — ONDANSETRON HCL 4 MG PO TABS: 4.0000 mg | ORAL_TABLET | Freq: Four times a day (QID) | ORAL | Status: DC | PRN

## 2022-11-17 MED ORDER — DEXTROSE-SODIUM CHLORIDE 5-0.9 % IV SOLN: INTRAVENOUS | Status: DC

## 2022-11-17 MED ORDER — ALBUTEROL SULFATE HFA 108 (90 BASE) MCG/ACT IN AERS
INHALATION_SPRAY | RESPIRATORY_TRACT | Status: DC | PRN
  Administered 2022-11-17: 8 via RESPIRATORY_TRACT

## 2022-11-17 MED ORDER — FENTANYL CITRATE (PF) 250 MCG/5ML IJ SOLN
INTRAMUSCULAR | Status: DC | PRN
  Administered 2022-11-17: 150 ug via INTRAVENOUS
  Administered 2022-11-17: 100 ug via INTRAVENOUS

## 2022-11-17 MED ORDER — METOCLOPRAMIDE HCL 5 MG PO TABS: 5.0000 mg | ORAL_TABLET | Freq: Three times a day (TID) | ORAL | Status: DC | PRN

## 2022-11-17 MED ORDER — SODIUM CHLORIDE FLUSH 0.9 % IV SOLN
INTRAVENOUS | Status: DC | PRN
  Administered 2022-11-17: 30 mL

## 2022-11-17 MED ORDER — HYDROMORPHONE HCL 1 MG/ML IJ SOLN
INTRAMUSCULAR | Status: AC
  Filled 2022-11-17: qty 1

## 2022-11-17 MED ORDER — BUPIVACAINE LIPOSOME 1.3 % IJ SUSP
INTRAMUSCULAR | Status: DC | PRN
  Administered 2022-11-17: 20 mL

## 2022-11-17 MED ORDER — PROPOFOL 10 MG/ML IV BOLUS
INTRAVENOUS | Status: AC
  Filled 2022-11-17: qty 20

## 2022-11-17 MED ORDER — ALBUTEROL SULFATE HFA 108 (90 BASE) MCG/ACT IN AERS
INHALATION_SPRAY | RESPIRATORY_TRACT | Status: AC
  Filled 2022-11-17: qty 6.7

## 2022-11-17 MED ORDER — DEXMEDETOMIDINE HCL IN NACL 80 MCG/20ML IV SOLN
INTRAVENOUS | Status: AC
  Filled 2022-11-17: qty 20

## 2022-11-17 MED ORDER — MENTHOL 3 MG MT LOZG: 1.0000 | LOZENGE | OROMUCOSAL | Status: DC | PRN

## 2022-11-17 MED ORDER — DEXAMETHASONE SODIUM PHOSPHATE 10 MG/ML IJ SOLN
10.0000 mg | Freq: Once | INTRAMUSCULAR | Status: AC
  Administered 2022-11-18: 10 mg via INTRAVENOUS
  Filled 2022-11-17: qty 1

## 2022-11-17 MED ORDER — OXYCODONE HCL 5 MG PO TABS
ORAL_TABLET | ORAL | Status: AC
  Filled 2022-11-17: qty 1

## 2022-11-17 MED ORDER — MIDAZOLAM HCL 2 MG/2ML IJ SOLN
INTRAMUSCULAR | Status: AC
  Filled 2022-11-17: qty 2

## 2022-11-17 MED ORDER — CHLORHEXIDINE GLUCONATE 0.12 % MT SOLN
OROMUCOSAL | Status: AC
  Filled 2022-11-17: qty 15

## 2022-11-17 MED ORDER — PHENYLEPHRINE HCL-NACL 20-0.9 MG/250ML-% IV SOLN
INTRAVENOUS | Status: DC | PRN
  Administered 2022-11-17: 15 ug/min via INTRAVENOUS

## 2022-11-17 MED ORDER — ESMOLOL HCL 100 MG/10ML IV SOLN
INTRAVENOUS | Status: DC | PRN
  Administered 2022-11-17: 5 mg via INTRAVENOUS
  Administered 2022-11-17: 20 mg via INTRAVENOUS
  Administered 2022-11-17 (×2): 5 mg via INTRAVENOUS
  Administered 2022-11-17 (×2): 10 mg via INTRAVENOUS

## 2022-11-17 MED ORDER — ACETAMINOPHEN 325 MG PO TABS: 325.0000 mg | ORAL_TABLET | Freq: Four times a day (QID) | ORAL | Status: DC | PRN

## 2022-11-17 SURGICAL SUPPLY — 55 items
BAG COUNTER SPONGE SURGICOUNT (BAG) ×1 IMPLANT
BLADE SAG 18X100X1.27 (BLADE) IMPLANT
CLSR STERI-STRIP ANTIMIC 1/2X4 (GAUZE/BANDAGES/DRESSINGS) ×1 IMPLANT
COVER PERINEAL POST (MISCELLANEOUS) ×1 IMPLANT
COVER SURGICAL LIGHT HANDLE (MISCELLANEOUS) ×1 IMPLANT
DRAPE C-ARM 42X72 X-RAY (DRAPES) ×1 IMPLANT
DRAPE STERI IOBAN 125X83 (DRAPES) ×1 IMPLANT
DRAPE U-SHAPE 47X51 STRL (DRAPES) IMPLANT
DRSG MEPILEX POST OP 4X8 (GAUZE/BANDAGES/DRESSINGS) ×1 IMPLANT
DURAPREP 26ML APPLICATOR (WOUND CARE) ×1 IMPLANT
ELECT BLADE 4.0 EZ CLEAN MEGAD (MISCELLANEOUS) ×1
ELECT REM PT RETURN 9FT ADLT (ELECTROSURGICAL) ×1
ELECTRODE BLDE 4.0 EZ CLN MEGD (MISCELLANEOUS) ×1 IMPLANT
ELECTRODE REM PT RTRN 9FT ADLT (ELECTROSURGICAL) ×1 IMPLANT
FACESHIELD WRAPAROUND (MASK) ×2 IMPLANT
FACESHIELD WRAPAROUND OR TEAM (MASK) ×1 IMPLANT
GLOVE BIO SURGEON STRL SZ7.5 (GLOVE) ×1 IMPLANT
GLOVE BIOGEL PI IND STRL 7.5 (GLOVE) ×1 IMPLANT
GLOVE BIOGEL PI IND STRL 8 (GLOVE) ×1 IMPLANT
GLOVE SURG SYN 7.5  E (GLOVE) ×1
GLOVE SURG SYN 7.5 E (GLOVE) ×1 IMPLANT
GLOVE SURG SYN 7.5 PF PI (GLOVE) ×1 IMPLANT
GOWN STRL REUS W/ TWL LRG LVL3 (GOWN DISPOSABLE) ×2 IMPLANT
GOWN STRL REUS W/ TWL XL LVL3 (GOWN DISPOSABLE) ×1 IMPLANT
GOWN STRL REUS W/TWL LRG LVL3 (GOWN DISPOSABLE) ×2
GOWN STRL REUS W/TWL XL LVL3 (GOWN DISPOSABLE) ×1
HEAD BIOLOX HIP 36/-5 (Joint) IMPLANT
HIP BIOLOX HD 36/-5 (Joint) ×1 IMPLANT
INSERT 0 DEGREE 36 (Miscellaneous) IMPLANT
KIT BASIN OR (CUSTOM PROCEDURE TRAY) ×1 IMPLANT
KIT TURNOVER KIT B (KITS) ×1 IMPLANT
MANIFOLD NEPTUNE II (INSTRUMENTS) ×1 IMPLANT
NEEDLE HYPO 22GX1.5 SAFETY (NEEDLE) ×1 IMPLANT
NS IRRIG 1000ML POUR BTL (IV SOLUTION) ×1 IMPLANT
PACK TOTAL JOINT (CUSTOM PROCEDURE TRAY) ×1 IMPLANT
PAD ARMBOARD 7.5X6 YLW CONV (MISCELLANEOUS) ×1 IMPLANT
SCREW HEX LP 6.5X20 (Screw) IMPLANT
SHELL ACETAB TRIDENT 48 (Shell) IMPLANT
SPONGE T-LAP 18X18 ~~LOC~~+RFID (SPONGE) IMPLANT
STEM HIP 4 127DEG (Stem) IMPLANT
STRIP CLOSURE SKIN 1/2X4 (GAUZE/BANDAGES/DRESSINGS) IMPLANT
SUT MNCRL AB 4-0 PS2 18 (SUTURE) ×1 IMPLANT
SUT STRATAFIX 1PDS 45CM VIOLET (SUTURE) ×1 IMPLANT
SUT VIC AB 0 CT1 27 (SUTURE) ×2
SUT VIC AB 0 CT1 27XBRD ANBCTR (SUTURE) ×2 IMPLANT
SUT VIC AB 1 CT1 27 (SUTURE) ×2
SUT VIC AB 1 CT1 27XBRD ANBCTR (SUTURE) ×2 IMPLANT
SUT VIC AB 2-0 CT1 27 (SUTURE) ×2
SUT VIC AB 2-0 CT1 TAPERPNT 27 (SUTURE) ×2 IMPLANT
SYR 50ML LL SCALE MARK (SYRINGE) ×2 IMPLANT
SYR BULB IRRIG 60ML STRL (SYRINGE) ×1 IMPLANT
TOWEL GREEN STERILE (TOWEL DISPOSABLE) ×1 IMPLANT
TOWEL GREEN STERILE FF (TOWEL DISPOSABLE) ×1 IMPLANT
WATER STERILE IRR 1000ML POUR (IV SOLUTION) ×1 IMPLANT
YANKAUER SUCT BULB TIP NO VENT (SUCTIONS) ×2 IMPLANT

## 2022-11-17 NOTE — Transfer of Care (Signed)
Immediate Anesthesia Transfer of Care Note  Patient: Sharon Welch  Procedure(s) Performed: TOTAL HIP ARTHROPLASTY ANTERIOR APPROACH (Left: Hip)  Patient Location: PACU  Anesthesia Type:General  Level of Consciousness: awake, alert , and oriented  Airway & Oxygen Therapy: Patient Spontanous Breathing  Post-op Assessment: Post -op Vital signs reviewed and stable  Post vital signs: stable  Last Vitals:  Vitals Value Taken Time  BP 150/89 11/17/22 1000  Temp    Pulse 114 11/17/22 1007  Resp 17 11/17/22 1007  SpO2 100 % 11/17/22 1007  Vitals shown include unvalidated device data.  Last Pain:  Vitals:   11/17/22 0713  TempSrc: Oral  PainSc:          Complications: No notable events documented.

## 2022-11-17 NOTE — Op Note (Addendum)
11/15/2022 - 11/17/2022  9:17 AM  PATIENT:  Sharon Welch   MRN: [id number]  PRE-OPERATIVE DIAGNOSIS:  left femoral neck fracture  POST-OPERATIVE DIAGNOSIS:  left femoral neck fracture  PROCEDURE:  Procedure(s): TOTAL HIP ARTHROPLASTY ANTERIOR APPROACH  PREOPERATIVE INDICATIONS:    Freyah Hanus Ganoe is an 52 y.o. female who has a diagnosis of Hip fracture (HCC) and elected for surgical management after failing conservative treatment.  The risks benefits and alternatives were discussed with the patient including but not limited to the risks of nonoperative treatment, versus surgical intervention including infection, bleeding, nerve injury, periprosthetic fracture, the need for revision surgery, dislocation, leg length discrepancy, blood clots, cardiopulmonary complications, morbidity, mortality, among others, and they were willing to proceed.     OPERATIVE REPORT     SURGEON:   Sheral Apley, MD    ASSISTANT:  Levester Fresh, PA-C, she was present and scrubbed throughout the case, critical for completion in a timely fashion, and for retraction, instrumentation, and closure.     ANESTHESIA:  General    COMPLICATIONS:  None.     COMPONENTS:  Stryker acolade fit femur size 4 with a 36 mm -5 head ball and an acetabular shell size 48 with a  polyethylene liner    PROCEDURE IN DETAIL:   The patient was met in the holding area and  identified.  The appropriate hip was identified and marked at the operative site.  The patient was then transported to the OR  and  placed under anesthesia per that record.  At that point, the patient was  placed in the supine position and  secured to the operating room table and all bony prominences padded. He received pre-operative antibiotics    The operative lower extremity was prepped from the iliac crest to the distal leg.  Sterile draping was performed.  Time out was performed prior to incision.      Skin incision was made just 2 cm lateral to the ASIS  extending  in line with the tensor fascia lata. Electrocautery was used to control all bleeders. I dissected down sharply to the fascia of the tensor fascia lata was confirmed that the muscle fibers beneath were running posteriorly. I then incised the fascia over the superficial tensor fascia lata in line with the incision. The fascia was elevated off the anterior aspect of the muscle the muscle was retracted posteriorly and protected throughout the case. I then used electrocautery to incise the tensor fascia lata fascia control and all bleeders. Immediately visible was the fat over top of the anterior neck and capsule.  I removed the anterior fat from the capsule and elevated the rectus muscle off of the anterior capsule. I then removed a large time of capsule. The retractors were then placed over the anterior acetabulum as well as around the superior and inferior neck.  I then made a femoral neck cut through the fractured fem neck. Then used the power corkscrew to remove the femoral head from the acetabulum and thoroughly irrigated the acetabulum. I sized the femoral head.    I then exposed the deep acetabulum, cleared out any tissue including the ligamentum teres.   After adequate visualization, I excised the labrum, and then sequentially reamed.  I then impacted the acetabular implant into place using fluoroscopy for guidance.  Appropriate version and inclination was confirmed clinically matching their bony anatomy, and with fluoroscopy.  I placed a 20 mm screw in the posterior/superio position with an excellent bite.  I then placed the polyethylene liner in place  I then adducted the leg and released the external rotators from the posterior femur allowing it to be easily delivered up lateral and anterior to the acetabulum for preparation of the femoral canal.    I then prepared the proximal femur using the cookie-cutter and then sequentially reamed and broached.  A trial broach, neck, and head was  utilized, and I reduced the hip and used floroscopy to assess the neck length and femoral implant.  I then impacted the femoral prosthesis into place into the appropriate version. The hip was then reduced and fluoroscopy confirmed appropriate position. Leg lengths were restored.  I then irrigated the hip copiously again with, and repaired the fascia with Vicryl, followed by monocryl for the subcutaneous tissue, Monocryl for the skin, Steri-Strips and sterile gauze. The patient was then awakened and returned to PACU in stable and satisfactory condition. There were no complications.  POST OPERATIVE PLAN: WBAT, DVT px: SCD's/TED, ambulation and chemical dvt px  Margarita Rana, MD Orthopedic Surgeon [phone]

## 2022-11-17 NOTE — Anesthesia Preprocedure Evaluation (Addendum)
Anesthesia Evaluation  Patient identified by MRN, date of birth, ID band Patient awake    Reviewed: Allergy & Precautions, NPO status , Patient's Chart, lab work & pertinent test results  Airway Mallampati: II  TM Distance: >3 FB Neck ROM: Full    Dental  (+) Edentulous Lower, Edentulous Upper   Pulmonary Current Smoker and Patient abstained from smoking.   Pulmonary exam normal        Cardiovascular negative cardio ROS Normal cardiovascular exam     Neuro/Psych  Headaches, Seizures -,  PSYCHIATRIC DISORDERS       Neuromuscular disease    GI/Hepatic PUD,GERD  Medicated and Controlled,,(+)     substance abuse    Endo/Other  negative endocrine ROS    Renal/GU negative Renal ROS     Musculoskeletal  (+) Arthritis ,  Fibromyalgia -, narcotic dependentScoliosis Chronic back pain   Abdominal   Peds  Hematology negative hematology ROS (+)   Anesthesia Other Findings left femoral neck fracture  Reproductive/Obstetrics                             Anesthesia Physical Anesthesia Plan  ASA: 2  Anesthesia Plan: General   Post-op Pain Management:    Induction: Intravenous  PONV Risk Score and Plan: 2 and Ondansetron, Dexamethasone, Midazolam and Treatment may vary due to age or medical condition  Airway Management Planned: Oral ETT  Additional Equipment:   Intra-op Plan:   Post-operative Plan: Extubation in OR  Informed Consent: I have reviewed the patients History and Physical, chart, labs and discussed the procedure including the risks, benefits and alternatives for the proposed anesthesia with the patient or authorized representative who has indicated his/her understanding and acceptance.     Dental advisory given  Plan Discussed with: CRNA  Anesthesia Plan Comments:         Anesthesia Quick Evaluation

## 2022-11-17 NOTE — Anesthesia Procedure Notes (Signed)
Procedure Name: Intubation Date/Time: 11/17/2022 8:01 AM  Performed by: Wilder Glade, CRNAPre-anesthesia Checklist: Patient identified, Emergency Drugs available, Suction available, Patient being monitored and Timeout performed Patient Re-evaluated:Patient Re-evaluated prior to induction Oxygen Delivery Method: Circle system utilized Preoxygenation: Pre-oxygenation with 100% oxygen Induction Type: IV induction Ventilation: Mask ventilation without difficulty Laryngoscope Size: Miller and 2 Grade View: Grade I Tube type: Oral Tube size: 6.5 mm Number of attempts: 1 (brief atraumatic) Airway Equipment and Method: Stylet Placement Confirmation: ETT inserted through vocal cords under direct vision, positive ETCO2 and breath sounds checked- equal and bilateral Secured at: 19 cm Tube secured with: Tape Dental Injury: Teeth and Oropharynx as per pre-operative assessment

## 2022-11-17 NOTE — Progress Notes (Signed)
Pt wedding band placed in her handbag

## 2022-11-17 NOTE — Discharge Instructions (Signed)

## 2022-11-17 NOTE — Interval H&P Note (Signed)
History and Physical Interval Note:  11/17/2022 7:28 AM  Sharon Welch  has presented today for surgery, with the diagnosis of left femoral neck fracture.  The various methods of treatment have been discussed with the patient and family. After consideration of risks, benefits and other options for treatment, the patient has consented to  Procedure(s): TOTAL HIP ARTHROPLASTY ANTERIOR APPROACH (Left) as a surgical intervention.  The patient's history has been reviewed, patient examined, no change in status, stable for surgery.  I have reviewed the patient's chart and labs.  Questions were answered to the patient's satisfaction.     Sheral Apley

## 2022-11-17 NOTE — Anesthesia Postprocedure Evaluation (Signed)
Anesthesia Post Note  Patient: Sharon Welch  Procedure(s) Performed: TOTAL HIP ARTHROPLASTY ANTERIOR APPROACH (Left: Hip)     Patient location during evaluation: PACU Anesthesia Type: General Level of consciousness: awake Pain management: pain level controlled Vital Signs Assessment: post-procedure vital signs reviewed and stable Respiratory status: spontaneous breathing, nonlabored ventilation and respiratory function stable Cardiovascular status: blood pressure returned to baseline and stable Postop Assessment: no apparent nausea or vomiting Anesthetic complications: no   No notable events documented.  Last Vitals:  Vitals:   11/17/22 1130 11/17/22 1218  BP: (!) 148/81   Pulse: (!) 114   Resp: 10   Temp: 36.9 C   SpO2: 93% 94%    Last Pain:  Vitals:   11/17/22 1218  TempSrc:   PainSc: 5                  Adisson Deak P Steven Veazie

## 2022-11-17 NOTE — Progress Notes (Signed)
PROGRESS NOTE    Sharon Welch  JYN:[id number] DOB: 22-Nov-1970 DOA: 11/15/2022 PCP: Elfredia Nevins, MD   Chief Complaint  Patient presents with   Fall    Brief Narrative:  Patient 52 year old female history of seizure disorder, fibromyalgia, GERD, anxiety presented to the ED with chief complaints of a fall.  Patient reported she been having seizures which caused her to fall.  Patient noted to have falling down a flight of stairs 1 week prior to admission.  2 days prior to admission patient fell woke up and was concerned she broke her hip.  Patient reported was having a 24-hour EEG monitoring so did not want to miss that by coming to the hospital for broken hip.  EEG monitoring was done and patient subsequently presented to the hospital.  Imaging done concerning for left femoral neck fracture.  Orthopedics consulted.  Neurology also consulted in relation to patient's seizures.   Assessment & Plan:   Principal Problem:   Hip fracture (HCC) Active Problems:   Hypokalemia   Seizure disorder (HCC)   GERD (gastroesophageal reflux disease)   Tobacco use   Transaminitis  #1 left femoral neck fracture -Secondary to mechanical fall from proposed seizure-like activity.  Patient history. -Orthopedics consulted who have assessed the patient and recommending operative fixation per Dr. [name]. -Patient status post total hip arthroplasty on the left per orthopedics, Dr. [name] today 11/17/2022. -PT/OT. -Pain control, postop DVT prophylaxis per orthopedics.  2.  Seizure disorder -Patient reported had been having seizures leading to multiple falls prior to admission. -Patient states no longer takes Depakote only on Lamictal. -Spoke with neurologist/consulted neurology who reviewed chart and reviewed care everywhere which revealed that patient with a history of psychogenic nonepileptic seizures, diagnosed after extensive workup at [facility] including evaluation in ED EMU. -Per neurology patient  supposed to be on Lamictal and Depakote but not taking Depakote and recommended continuation of Lamictal for now, seizure precautions with outpatient follow-up with primary neurologist. -Per neurology if patient has any further seizure episodes inpatient may consider LTM EEG. -Continue home regimen Lamictal for now. -Outpatient follow-up with neurology.  3.  Tobacco abuse -Tobacco cessation.   -Nicotine patch.    4.  GERD -PPI.  5.  Hypokalemia -Repleted. -Magnesium at 1.8. -Repeat labs in the AM.  6.  Transaminitis ??  Etiology -Abdominal ultrasound prior cholecystectomy, dilated common bile duct which may be related to postcholecystectomy state.  Cannot exclude distal obstructing process as the distal duct is obscured by overlying bowel gas.  -Tylenol level noted at < 10. -Acute hepatitis panel ordered negative. -Transaminitis improving with hydration. -Continue IV fluids. -Repeat labs in AM.    DVT prophylaxis: SCDs..  Postop DVT prophylaxis per orthopedics. Code Status: Full Family Communication: Updated patient, no family at bedside.   Disposition: Likely needs SNF.    Status is: Inpatient Remains inpatient appropriate because: Severity of illness   Consultants:  Orthopedics: Dr. [name] 11/16/2022 Neurology  Procedures:  CT head CT C-spine 11/15/2022 Plain films of the left knee 11/15/2022 Plain films of the left hip and pelvis 11/15/2022 Abdominal ultrasound 11/16/2022-- Total left hip arthroplasty: Per Dr. [name] 11/17/2022  Antimicrobials:  Anti-infectives (From admission, onward)    Start     Dose/Rate Route Frequency Ordered Stop   11/17/22 1400  ceFAZolin (ANCEF) IVPB 1 g/50 mL premix        1 g 100 mL/hr over 30 Minutes Intravenous Every 6 hours 11/17/22 1308 11/18/22 0159   11/17/22 0730  ceFAZolin (  ANCEF) IVPB 2g/100 mL premix        2 g 200 mL/hr over 30 Minutes Intravenous On call to O.R. 11/17/22 0715 11/17/22 1610         Subjective: Patient  laying in bed.  Just returned from the OR.  Denies any chest pain or shortness of breath.  No abdominal pain.  States significant improvement with left lower extremity pain postoperatively.  Objective: Vitals:   11/17/22 1100 11/17/22 1115 11/17/22 1130 11/17/22 1218  BP: 136/76 (!) 146/76 (!) 148/81   Pulse: (!) 113 (!) 112 (!) 114   Resp: 14 13 10    Temp:   98.5 F (36.9 C)   TempSrc:      SpO2: 93% 94% 93% 94%  Weight:      Height:        Intake/Output Summary (Last 24 hours) at 11/17/2022 1549 Last data filed at 11/17/2022 1200 Gross per 24 hour  Intake 2928.72 ml  Output 700 ml  Net 2228.72 ml    Filed Weights   11/15/22 1706  Weight: 44.9 kg    Examination:  General exam: NAD Respiratory system: Lungs clear to auscultation bilaterally anterior lung fields.  No wheezes, no crackles, no rhonchi.  Cardiovascular system: Regular rate and rhythm no murmurs rubs or gallops.  No JVD.  No lower extremity edema.  Gastrointestinal system: Abdomen is soft, nontender, nondistended, positive bowel sounds.  No rebound.  No guarding.  Central nervous system: Alert and oriented. No focal neurological deficits. Extremities: No clubbing cyanosis or edema.  Postop bandage noted.  Skin: No rashes, lesions or ulcers Psychiatry: Judgement and insight appear normal. Mood & affect appropriate.     Data Reviewed: I have personally reviewed following labs and imaging studies  CBC: Recent Labs  Lab 11/15/22 1901 11/16/22 0159 11/17/22 0114  WBC 8.6 10.5 7.1  NEUTROABS  --  6.1 3.9  HGB 13.3 12.8 11.8*  HCT 40.1 38.7 36.6  MCV 95.0 96.3 96.6  PLT 387 393 302     Basic Metabolic Panel: Recent Labs  Lab 11/15/22 1901 11/16/22 0159 11/17/22 0114  NA 138 136 135  K 2.9* 5.0 3.7  CL 97* 101 101  CO2 22 20* 23  GLUCOSE 76 90 68*  BUN 19 14 8   CREATININE 0.77 0.89 0.74  CALCIUM 8.9 8.6* 8.1*  MG  --  1.9 1.8     GFR: Estimated Creatinine Clearance: 57.6 mL/min (by C-G  formula based on SCr of 0.74 mg/dL).  Liver Function Tests: Recent Labs  Lab 11/16/22 0159 11/17/22 0114  AST 417* 100*  ALT 141* 81*  ALKPHOS 1,650* 1,228*  BILITOT 2.0* 1.1  PROT 6.3* 5.3*  ALBUMIN 3.0* 2.5*     CBG: Recent Labs  Lab 11/17/22 0530 11/17/22 0840  GLUCAP 70 95     Recent Results (from the past 240 hour(s))  Surgical pcr screen     Status: None   Collection Time: 11/16/22 10:49 PM   Specimen: Nasal Mucosa; Nasal Swab  Result Value Ref Range Status   MRSA, PCR NEGATIVE NEGATIVE Final   Staphylococcus aureus NEGATIVE NEGATIVE Final    Comment: (NOTE) The Xpert SA Assay (FDA approved for NASAL specimens in patients 13 years of age and older), is one component of a comprehensive surveillance program. It is not intended to diagnose infection nor to guide or monitor treatment. Performed at [facility], [address], [place], [place] [address]  Radiology Studies: DG HIP UNILAT WITH PELVIS 1V LEFT  Result Date: 11/17/2022 CLINICAL DATA:  Elective surgery. Intraoperative fluoroscopy for total left hip arthroplasty. EXAM: DG HIP (WITH OR WITHOUT PELVIS) 1V*L* COMPARISON:  Pelvis and left hip radiographs 11/15/2022 FINDINGS: Images were performed intraoperatively without the presence of a radiologist. New total left hip arthroplasty. No hardware complication is seen. Total fluoroscopy images: 2 Total fluoroscopy time: 21 seconds Total dose: Radiation Exposure Index (as provided by the fluoroscopic device): 2.01 mGy air Kerma Please see intraoperative findings for further detail. IMPRESSION: Intraoperative fluoroscopy for total left hip arthroplasty. Electronically Signed   By: Neita Garnet M.D.   On: 11/17/2022 10:35   DG C-Arm 1-60 Min-No Report  Result Date: 11/17/2022 Fluoroscopy was utilized by the requesting physician.  No radiographic interpretation.   US Abdomen Complete  Result Date: 11/16/2022 CLINICAL DATA:  Transaminitis EXAM:  ABDOMEN ULTRASOUND COMPLETE COMPARISON:  None Available. FINDINGS: Gallbladder: Prior cholecystectomy Common bile duct: Diameter: Dilated measuring 17 mm. The distal duct is not visualized due to overlying bowel gas. Liver: No focal lesion identified. Within normal limits in parenchymal echogenicity. Portal vein is patent on color Doppler imaging with normal direction of blood flow towards the liver. IVC: No abnormality visualized. Pancreas: Not visualized due to overlying bowel gas Spleen: Size and appearance within normal limits. Right Kidney: Length: Atrophic, 8.5 cm. Echogenicity within normal limits. No mass or hydronephrosis visualized. Left Kidney: Length: Atrophic, 8.6 cm. Echogenicity within normal limits. No mass or hydronephrosis visualized. Abdominal aorta: No aneurysm visualized. Other findings: None. IMPRESSION: Prior cholecystectomy. Dilated common bile duct which may be related to post cholecystectomy state. Cannot exclude distal obstructing process as the distal duct is obscured by overlying bowel gas. Consider further evaluation with MRCP or ERCP if felt clinically indicated. Electronically Signed   By: Charlett Nose M.D.   On: 11/16/2022 19:59   CT Head Wo Contrast  Result Date: 11/15/2022 CLINICAL DATA:  Multiple falls EXAM: CT HEAD WITHOUT CONTRAST CT CERVICAL SPINE WITHOUT CONTRAST TECHNIQUE: Multidetector CT imaging of the head and cervical spine was performed following the standard protocol without intravenous contrast. Multiplanar CT image reconstructions of the cervical spine were also generated. RADIATION DOSE REDUCTION: This exam was performed according to the departmental dose-optimization program which includes automated exposure control, adjustment of the mA and/or kV according to patient size and/or use of iterative reconstruction technique. COMPARISON:  11/08/2022 FINDINGS: CT HEAD FINDINGS Brain: No evidence of acute infarction, hemorrhage, hydrocephalus, extra-axial collection  or mass lesion/mass effect. Vascular: No hyperdense vessel or unexpected calcification. Skull: Normal. Negative for fracture or focal lesion. Sinuses/Orbits: No acute finding. Other: None. CT CERVICAL SPINE FINDINGS Alignment: Normal. Skull base and vertebrae: No acute fracture. No primary bone lesion or focal pathologic process. Soft tissues and spinal canal: No prevertebral fluid or swelling. No visible canal hematoma. Disc levels: Anterior cervical discectomy and fusion at C6-C7. Otherwise intact disc spaces. Upper chest: Negative. Other: None. IMPRESSION: 1. No acute intracranial pathology. 2. No fracture or static subluxation of the cervical spine. 3. Anterior cervical discectomy and fusion at C6-C7. Electronically Signed   By: Jearld Lesch M.D.   On: 11/15/2022 19:35   CT Cervical Spine Wo Contrast  Result Date: 11/15/2022 CLINICAL DATA:  Multiple falls EXAM: CT HEAD WITHOUT CONTRAST CT CERVICAL SPINE WITHOUT CONTRAST TECHNIQUE: Multidetector CT imaging of the head and cervical spine was performed following the standard protocol without intravenous contrast. Multiplanar CT image reconstructions of the cervical spine were also  generated. RADIATION DOSE REDUCTION: This exam was performed according to the departmental dose-optimization program which includes automated exposure control, adjustment of the mA and/or kV according to patient size and/or use of iterative reconstruction technique. COMPARISON:  11/08/2022 FINDINGS: CT HEAD FINDINGS Brain: No evidence of acute infarction, hemorrhage, hydrocephalus, extra-axial collection or mass lesion/mass effect. Vascular: No hyperdense vessel or unexpected calcification. Skull: Normal. Negative for fracture or focal lesion. Sinuses/Orbits: No acute finding. Other: None. CT CERVICAL SPINE FINDINGS Alignment: Normal. Skull base and vertebrae: No acute fracture. No primary bone lesion or focal pathologic process. Soft tissues and spinal canal: No prevertebral fluid  or swelling. No visible canal hematoma. Disc levels: Anterior cervical discectomy and fusion at C6-C7. Otherwise intact disc spaces. Upper chest: Negative. Other: None. IMPRESSION: 1. No acute intracranial pathology. 2. No fracture or static subluxation of the cervical spine. 3. Anterior cervical discectomy and fusion at C6-C7. Electronically Signed   By: Jearld Lesch M.D.   On: 11/15/2022 19:35   DG Knee 2 Views Left  Result Date: 11/15/2022 CLINICAL DATA:  Fall 2 days ago EXAM: LEFT KNEE - 1-2 VIEW COMPARISON:  None Available. FINDINGS: No evidence of fracture, dislocation, or joint effusion. No evidence of arthropathy or other focal bone abnormality. Soft tissues are unremarkable. IMPRESSION: No fracture or dislocation of the left knee. Electronically Signed   By: Jearld Lesch M.D.   On: 11/15/2022 17:48   DG Hip Unilat With Pelvis 2-3 Views Left  Result Date: 11/15/2022 CLINICAL DATA:  Fall EXAM: DG HIP (WITH OR WITHOUT PELVIS) 2-3V LEFT COMPARISON:  11/08/2022 FINDINGS: Hardware in the lumbar spine. Pubic symphysis and rami appear intact. Acute left femoral neck fracture with cranial displacement of the distal femoral fracture fragment. Femoral head projects in joint. IMPRESSION: Acute displaced left femoral neck fracture. Electronically Signed   By: Jasmine Pang M.D.   On: 11/15/2022 17:48        Scheduled Meds:  acetaminophen  1,000 mg Oral Q6H   amitriptyline  50 mg Oral QHS   aspirin  81 mg Oral BID   [START ON 11/18/2022] dexamethasone (DECADRON) injection  10 mg Intravenous Once   docusate sodium  100 mg Oral BID   fentaNYL       fentaNYL       folic acid  1 mg Oral Daily   HYDROmorphone       lamoTRIgine  100 mg Oral BID   linaclotide  145 mcg Oral QAC breakfast   Milnacipran  50 mg Oral BID   nicotine  14 mg Transdermal Daily   oxyCODONE       pantoprazole  40 mg Oral Daily   Continuous Infusions:  acetaminophen      ceFAZolin (ANCEF) IV     dextrose 5 % and 0.9 %  NaCl     methocarbamol (ROBAXIN) IV     tranexamic acid       LOS: 2 days    Time spent: 35 minutes    Ramiro Harvest, MD [facility]   To contact the attending provider between 7A-7P or the covering provider during after hours 7P-7A, please log into the web site [email or web address] and access using universal [place] password for that web site. If you do not have the password, please call the hospital operator.  11/17/2022, 3:49 PM

## 2022-11-18 ENCOUNTER — Encounter (HOSPITAL_COMMUNITY): Payer: Self-pay | Admitting: Orthopedic Surgery

## 2022-11-18 DIAGNOSIS — K219 Gastro-esophageal reflux disease without esophagitis: Secondary | ICD-10-CM | POA: Diagnosis not present

## 2022-11-18 DIAGNOSIS — S72002P Fracture of unspecified part of neck of left femur, subsequent encounter for closed fracture with malunion: Secondary | ICD-10-CM | POA: Diagnosis not present

## 2022-11-18 DIAGNOSIS — E876 Hypokalemia: Secondary | ICD-10-CM | POA: Diagnosis not present

## 2022-11-18 DIAGNOSIS — G40909 Epilepsy, unspecified, not intractable, without status epilepticus: Secondary | ICD-10-CM | POA: Diagnosis not present

## 2022-11-18 LAB — CBC
HCT: 34.7 % — ABNORMAL LOW (ref 36.0–46.0)
Hemoglobin: 11.5 g/dL — ABNORMAL LOW (ref 12.0–15.0)
MCH: 31.2 pg (ref 26.0–34.0)
MCHC: 33.1 g/dL (ref 30.0–36.0)
MCV: 94 fL (ref 80.0–100.0)
Platelets: 370 10*3/uL (ref 150–400)
RBC: 3.69 MIL/uL — ABNORMAL LOW (ref 3.87–5.11)
RDW: 14.4 % (ref 11.5–15.5)
WBC: 13.8 10*3/uL — ABNORMAL HIGH (ref 4.0–10.5)
nRBC: 0 % (ref 0.0–0.2)

## 2022-11-18 LAB — COMPREHENSIVE METABOLIC PANEL
ALT: 75 U/L — ABNORMAL HIGH (ref 0–44)
AST: 72 U/L — ABNORMAL HIGH (ref 15–41)
Albumin: 3 g/dL — ABNORMAL LOW (ref 3.5–5.0)
Alkaline Phosphatase: 1354 U/L — ABNORMAL HIGH (ref 38–126)
Anion gap: 12 (ref 5–15)
BUN: 7 mg/dL (ref 6–20)
CO2: 27 mmol/L (ref 22–32)
Calcium: 8.6 mg/dL — ABNORMAL LOW (ref 8.9–10.3)
Chloride: 94 mmol/L — ABNORMAL LOW (ref 98–111)
Creatinine, Ser: 0.84 mg/dL (ref 0.44–1.00)
GFR, Estimated: >60 mL/min (ref >60)
Glucose, Bld: 93 mg/dL (ref 70–99)
Potassium: 3.4 mmol/L — ABNORMAL LOW (ref 3.5–5.1)
Sodium: 133 mmol/L — ABNORMAL LOW (ref 135–145)
Total Bilirubin: 0.9 mg/dL (ref 0.3–1.2)
Total Protein: 6.3 g/dL — ABNORMAL LOW (ref 6.5–8.1)

## 2022-11-18 LAB — PHOSPHORUS: Phosphorus: 3.2 mg/dL (ref 2.5–4.6)

## 2022-11-18 LAB — MAGNESIUM: Magnesium: 1.7 mg/dL (ref 1.7–2.4)

## 2022-11-18 MED ORDER — POTASSIUM CHLORIDE CRYS ER 10 MEQ PO TBCR
40.0000 meq | EXTENDED_RELEASE_TABLET | Freq: Once | ORAL | Status: AC
  Administered 2022-11-18: 40 meq via ORAL
  Filled 2022-11-18: qty 4

## 2022-11-18 MED ORDER — MAGNESIUM SULFATE 2 GM/50ML IV SOLN
2.0000 g | Freq: Once | INTRAVENOUS | Status: AC
  Administered 2022-11-18: 2 g via INTRAVENOUS
  Filled 2022-11-18: qty 50

## 2022-11-18 NOTE — Progress Notes (Signed)
PROGRESS NOTE    Sharon Welch  ZOX:[id number] DOB: 1971/04/06 DOA: 11/15/2022 PCP: Elfredia Nevins, MD   Chief Complaint  Patient presents with   Fall    Brief Narrative:  Patient 52 year old female history of seizure disorder, fibromyalgia, GERD, anxiety presented to the ED with chief complaints of a fall.  Patient reported she been having seizures which caused her to fall.  Patient noted to have falling down a flight of stairs 1 week prior to admission.  2 days prior to admission patient fell woke up and was concerned she broke her hip.  Patient reported was having a 24-hour EEG monitoring so did not want to miss that by coming to the hospital for broken hip.  EEG monitoring was done and patient subsequently presented to the hospital.  Imaging done concerning for left femoral neck fracture.  Orthopedics consulted.  Neurology also consulted in relation to patient's seizures.   Assessment & Plan:   Principal Problem:   Hip fracture (HCC) Active Problems:   Hypokalemia   Seizure disorder (HCC)   GERD (gastroesophageal reflux disease)   Tobacco use   Transaminitis  #1 left femoral neck fracture -Secondary to mechanical fall from proposed seizure-like activity.  Patient history. -Orthopedics consulted who have assessed the patient and recommending operative fixation per Dr. [name]. -Patient status post total hip arthroplasty on the left per orthopedics, Dr. [name] today 11/17/2022. -PT/OT has assessed patient and recommended home health therapies.. -Pain control, postop DVT prophylaxis per orthopedics.  2.  Seizure disorder -Patient reported had been having seizures leading to multiple falls prior to admission. -Patient states no longer takes Depakote only on Lamictal. -Spoke with neurologist/consulted neurology who reviewed chart and reviewed care everywhere which revealed that patient with a history of psychogenic nonepileptic seizures, diagnosed after extensive workup at [facility]  including evaluation in ED EMU. -Per neurology patient supposed to be on Lamictal and Depakote but not taking Depakote and recommended continuation of Lamictal for now, seizure precautions with outpatient follow-up with primary neurologist. -Per neurology if patient has any further seizure episodes inpatient may consider LTM EEG. -Continue home regimen Lamictal for now. -Outpatient follow-up with neurology.  3.  Tobacco abuse -Tobacco cessation.   -Nicotine patch.    4.  GERD -PPI.  5.  Hypokalemia -K-Dur 40 mEq p.o. x 1.   -Magnesium at 1.7 will give magnesium sulfate 2 g IV x 1.  -Repeat labs in the AM.  6.  Transaminitis ??  Etiology -Abdominal ultrasound prior cholecystectomy, dilated common bile duct which may be related to postcholecystectomy state.  Cannot exclude distal obstructing process as the distal duct is obscured by overlying bowel gas.  -Tylenol level noted at < 10. -Acute hepatitis panel ordered negative. -Transaminitis improving with hydration. -Continue IV fluids. -Will need outpatient follow-up. -Repeat labs in AM.    DVT prophylaxis: SCDs..  Postop DVT prophylaxis per orthopedics. Code Status: Full Family Communication: Updated patient, no family at bedside.   Disposition: Home with home health  Status is: Inpatient Remains inpatient appropriate because: Severity of illness   Consultants:  Orthopedics: Dr. [name] 11/16/2022 Neurology  Procedures:  CT head CT C-spine 11/15/2022 Plain films of the left knee 11/15/2022 Plain films of the left hip and pelvis 11/15/2022 Abdominal ultrasound 11/16/2022-- Total left hip arthroplasty: Per Dr. [name] 11/17/2022  Antimicrobials:  Anti-infectives (From admission, onward)    Start     Dose/Rate Route Frequency Ordered Stop   11/17/22 1400  ceFAZolin (ANCEF) IVPB 1 g/50 mL premix  1 g 100 mL/hr over 30 Minutes Intravenous Every 6 hours 11/17/22 1308 11/18/22 0159   11/17/22 0730  ceFAZolin (ANCEF) IVPB  2g/100 mL premix        2 g 200 mL/hr over 30 Minutes Intravenous On call to O.R. 11/17/22 0715 11/17/22 8657         Subjective: Sleeping, easily arousable.  States feels worn out after working with PT.  No chest pain.  No shortness of breath.  No abdominal pain.  No seizures noted.    Objective: Vitals:   11/17/22 2350 11/18/22 0441 11/18/22 0709 11/18/22 0807  BP: (!) 156/84 (!) 161/85 (!) 144/85 131/85  Pulse: (!) 101 (!) 103  (!) 102  Resp: 17 16 17 17   Temp:   98.1 F (36.7 C) 98.3 F (36.8 C)  TempSrc:   Oral Oral  SpO2: 100% 95% 99% 98%  Weight:      Height:        Intake/Output Summary (Last 24 hours) at 11/18/2022 1211 Last data filed at 11/18/2022 0932 Gross per 24 hour  Intake 580 ml  Output --  Net 580 ml    Filed Weights   11/15/22 1706  Weight: 44.9 kg    Examination:  General exam: NAD Respiratory system: CTAB.  No wheezes, no crackles, no rhonchi.  Fair air movement.  Speaking in full sentences.  Cardiovascular system: RRR no murmurs rubs or gallops.  No JVD.  No lower extremity edema.  Gastrointestinal system: Abdomen is soft, nontender, nondistended, positive bowel sounds.  No rebound.  No guarding.  Central nervous system: Alert and oriented. No focal neurological deficits. Extremities: No clubbing cyanosis or edema.  Postop bandage noted.  Skin: No rashes, lesions or ulcers Psychiatry: Judgement and insight appear normal. Mood & affect appropriate.     Data Reviewed: I have personally reviewed following labs and imaging studies  CBC: Recent Labs  Lab 11/15/22 1901 11/16/22 0159 11/17/22 0114 11/18/22 0206  WBC 8.6 10.5 7.1 13.8*  NEUTROABS  --  6.1 3.9  --   HGB 13.3 12.8 11.8* 11.5*  HCT 40.1 38.7 36.6 34.7*  MCV 95.0 96.3 96.6 94.0  PLT 387 393 302 370     Basic Metabolic Panel: Recent Labs  Lab 11/15/22 1901 11/16/22 0159 11/17/22 0114 11/18/22 0206  NA 138 136 135 133*  K 2.9* 5.0 3.7 3.4*  CL 97* 101 101 94*  CO2  22 20* 23 27  GLUCOSE 76 90 68* 93  BUN 19 14 8 7   CREATININE 0.77 0.89 0.74 0.84  CALCIUM 8.9 8.6* 8.1* 8.6*  MG  --  1.9 1.8 1.7  PHOS  --   --   --  3.2     GFR: Estimated Creatinine Clearance: 54.9 mL/min (by C-G formula based on SCr of 0.84 mg/dL).  Liver Function Tests: Recent Labs  Lab 11/16/22 0159 11/17/22 0114 11/18/22 0206  AST 417* 100* 72*  ALT 141* 81* 75*  ALKPHOS 1,650* 1,228* 1,354*  BILITOT 2.0* 1.1 0.9  PROT 6.3* 5.3* 6.3*  ALBUMIN 3.0* 2.5* 3.0*     CBG: Recent Labs  Lab 11/17/22 0530 11/17/22 0840  GLUCAP 70 95      Recent Results (from the past 240 hour(s))  Surgical pcr screen     Status: None   Collection Time: 11/16/22 10:49 PM   Specimen: Nasal Mucosa; Nasal Swab  Result Value Ref Range Status   MRSA, PCR NEGATIVE NEGATIVE Final   Staphylococcus aureus NEGATIVE NEGATIVE Final  Comment: (NOTE) The Xpert SA Assay (FDA approved for NASAL specimens in patients 59 years of age and older), is one component of a comprehensive surveillance program. It is not intended to diagnose infection nor to guide or monitor treatment. Performed at [facility], [address] [place], [place] [address]          Radiology Studies: DG HIP UNILAT WITH PELVIS 1V LEFT  Result Date: 11/17/2022 CLINICAL DATA:  Elective surgery. Intraoperative fluoroscopy for total left hip arthroplasty. EXAM: DG HIP (WITH OR WITHOUT PELVIS) 1V*L* COMPARISON:  Pelvis and left hip radiographs 11/15/2022 FINDINGS: Images were performed intraoperatively without the presence of a radiologist. New total left hip arthroplasty. No hardware complication is seen. Total fluoroscopy images: 2 Total fluoroscopy time: 21 seconds Total dose: Radiation Exposure Index (as provided by the fluoroscopic device): 2.01 mGy air Kerma Please see intraoperative findings for further detail. IMPRESSION: Intraoperative fluoroscopy for total left hip arthroplasty. Electronically Signed   By:  Neita Garnet M.D.   On: 11/17/2022 10:35   DG C-Arm 1-60 Min-No Report  Result Date: 11/17/2022 Fluoroscopy was utilized by the requesting physician.  No radiographic interpretation.        Scheduled Meds:  amitriptyline  50 mg Oral QHS   aspirin  81 mg Oral BID   docusate sodium  100 mg Oral BID   folic acid  1 mg Oral Daily   lamoTRIgine  100 mg Oral BID   linaclotide  145 mcg Oral QAC breakfast   Milnacipran  50 mg Oral BID   nicotine  14 mg Transdermal Daily   pantoprazole  40 mg Oral Daily   Continuous Infusions:  dextrose 5 % and 0.9 % NaCl     methocarbamol (ROBAXIN) IV       LOS: 3 days    Time spent: 35 minutes    Ramiro Harvest, MD [facility]   To contact the attending provider between 7A-7P or the covering provider during after hours 7P-7A, please log into the web site [email or web address] and access using universal [place] password for that web site. If you do not have the password, please call the hospital operator.  11/18/2022, 12:11 PM

## 2022-11-18 NOTE — Progress Notes (Signed)
    Subjective: Patient reports pain as mild.  Tolerating diet.  Urinating.   No CP, SOB.  Has mobilized some OOB with PT.  Objective:   VITALS:   Vitals:   11/17/22 2350 11/18/22 0441 11/18/22 0709 11/18/22 0807  BP: (!) 156/84 (!) 161/85 (!) 144/85 131/85  Pulse: (!) 101 (!) 103  (!) 102  Resp: 17 16 17 17   Temp:   98.1 F (36.7 C) 98.3 F (36.8 C)  TempSrc:   Oral Oral  SpO2: 100% 95% 99% 98%  Weight:      Height:          Latest Ref Rng & Units 11/18/2022    2:06 AM 11/17/2022    1:14 AM 11/16/2022    1:59 AM  CBC  WBC 4.0 - 10.5 K/uL 13.8  7.1  10.5   Hemoglobin 12.0 - 15.0 g/dL 46.9  62.9  52.8   Hematocrit 36.0 - 46.0 % 34.7  36.6  38.7   Platelets 150 - 400 K/uL 370  302  393       Latest Ref Rng & Units 11/18/2022    2:06 AM 11/17/2022    1:14 AM 11/16/2022    1:59 AM  BMP  Glucose 70 - 99 mg/dL 93  68  90   BUN 6 - 20 mg/dL 7  8  14    Creatinine 0.44 - 1.00 mg/dL 4.13  2.44  0.10   Sodium 135 - 145 mmol/L 133  135  136   Potassium 3.5 - 5.1 mmol/L 3.4  3.7  5.0   Chloride 98 - 111 mmol/L 94  101  101   CO2 22 - 32 mmol/L 27  23  20    Calcium 8.9 - 10.3 mg/dL 8.6  8.1  8.6    Intake/Output      05/26 0701 05/27 0700 05/27 0701 05/28 0700   P.O. 100 480   I.V. (mL/kg) 800 (17.8)    IV Piggyback 200    Total Intake(mL/kg) 1100 (24.5) 480 (10.7)   Urine (mL/kg/hr) 250 (0.2)    Blood 200    Total Output 450    Net +650 +480        Urine Occurrence 8 x 3 x   Stool Occurrence  1 x      Physical Exam: General: NAD.  Laying in bed, calm, comfortable Resp: No increased wob Cardio: regular rate and rhythm ABD soft Neurologically intact MSK Neurovascularly intact Sensation intact distally Intact pulses distally Dorsiflexion/Plantar flexion intact Incision: dressing C/D/I   Assessment: 1 Day Post-Op  S/P Procedure(s) (LRB): TOTAL HIP ARTHROPLASTY ANTERIOR APPROACH (Left) by Dr. Jewel Baize. [name] on 11/17/22  Principal Problem:   Hip fracture  (HCC) Active Problems:   Hypokalemia   Seizure disorder (HCC)   GERD (gastroesophageal reflux disease)   Tobacco use   Transaminitis   Plan:  Advance diet Up with therapy Incentive Spirometry Elevate and Apply ice  Weightbearing: WBAT LLE Insicional and dressing care: Dressings left intact until follow-up and Reinforce dressings as needed Orthopedic device(s): None Showering: Keep dressing dry VTE prophylaxis: Aspirin 81mg  BID  x 30 days postop , SCDs, ambulation Pain control: PRN Follow - up plan: 2 weeks Contact information:  Margarita Rana MD, Levester Fresh PA-C  Dispo:  Hoping to go home with HHPT     Marzetta Board Office [phone] 11/18/2022, 11:38 AM

## 2022-11-18 NOTE — Evaluation (Signed)
Occupational Therapy Evaluation Patient Details Name: Sharon Welch MRN: [id number] DOB: May 11, 1971 Today's Date: 11/18/2022   History of Present Illness Admitted after fall resulting in L hip fx; s/p THA for surgical repair, WBAT;  history of seizure disorder, fibromyalgia, GERD, anxiety   Clinical Impression   Patient admitted for the diagnosis above.  PTA she lives at home with her spouse, who is able to assist as needed.  Pain to the affected leg is the deficit, currently she is needing Mod A for lower body ADL and Min A for in room mobility at a RW level.  OT is indicated in the acute setting to maximize function prior to returning home.  No post acute OT is anticipated, but can be ordered by [facility] PT is needed.        Recommendations for follow up therapy are one component of a multi-disciplinary discharge planning process, led by the attending physician.  Recommendations may be updated based on patient status, additional functional criteria and insurance authorization.   Assistance Recommended at Discharge Intermittent Supervision/Assistance  Patient can return home with the following Assist for transportation;Assistance with cooking/housework;A little help with bathing/dressing/bathroom;A little help with walking and/or transfers    Functional Status Assessment  Patient has had a recent decline in their functional status and demonstrates the ability to make significant improvements in function in a reasonable and predictable amount of time.  Equipment Recommendations  None recommended by OT    Recommendations for Other Services       Precautions / Restrictions Precautions Precautions: Fall Precaution Comments: fall risk is present, but minimal Restrictions Weight Bearing Restrictions: No LLE Weight Bearing: Weight bearing as tolerated      Mobility Bed Mobility   Bed Mobility: Supine to Sit, Sit to Supine     Supine to sit: Mod assist Sit to supine: Mod assist         Transfers Overall transfer level: Needs assistance Equipment used: Rolling walker (2 wheels) Transfers: Sit to/from Stand Sit to Stand: Min assist                  Balance Overall balance assessment: Needs assistance Sitting-balance support: Feet supported Sitting balance-Leahy Scale: Good     Standing balance support: Reliant on assistive device for balance Standing balance-Leahy Scale: Poor                             ADL either performed or assessed with clinical judgement   ADL       Grooming: Wash/dry hands;Wash/dry face;Set up;Sitting               Lower Body Dressing: Moderate assistance;Sit to/from stand   Toilet Transfer: Minimal assistance;Rolling walker (2 wheels);Regular Toilet;Ambulation                   Vision Patient Visual Report: No change from baseline       Perception     Praxis      Pertinent Vitals/Pain Pain Assessment Pain Assessment: Faces Faces Pain Scale: Hurts even more Pain Location: L hip and back Pain Descriptors / Indicators: Aching, Tender Pain Intervention(s): Monitored during session     Hand Dominance Right   Extremity/Trunk Assessment Upper Extremity Assessment Upper Extremity Assessment: Overall WFL for tasks assessed   Lower Extremity Assessment Lower Extremity Assessment: Defer to PT evaluation LLE Deficits / Details: Grossly decr AROM and strength. limited by pain postop; Able to accept  weight onto LLE in stance       Communication Communication Communication: No difficulties   Cognition Arousal/Alertness: Awake/alert Behavior During Therapy: WFL for tasks assessed/performed Overall Cognitive Status: Within Functional Limits for tasks assessed                                       General Comments  Very motivated   VSS on RA    Exercises     Shoulder Instructions      Home Living Family/patient expects to be discharged to:: Private residence Living  Arrangements: Spouse/significant other Available Help at Discharge: Family;Available 24 hours/day Type of Home: House Home Access: Stairs to enter [employer] of Steps: 4 Entrance Stairs-Rails: Right Home Layout: One level     Bathroom Shower/Tub: Chief Strategy Officer: Standard Bathroom Accessibility: Yes How Accessible: Accessible via walker Home Equipment: None   Additional Comments: MIL has shower chair      Prior Functioning/Environment Prior Level of Function : Independent/Modified Independent                        OT Problem List: Decreased range of motion;Impaired balance (sitting and/or standing);Pain      OT Treatment/Interventions: Self-care/ADL training;Therapeutic exercise;Therapeutic activities;Patient/family education;Balance training;DME and/or AE instruction    OT Goals(Current goals can be found in the care plan section) Acute Rehab OT Goals Patient Stated Goal: Return home OT Goal Formulation: With patient Time For Goal Achievement: 12/02/22 Potential to Achieve Goals: Good ADL Goals Pt Will Perform Grooming: with modified independence;standing Pt Will Perform Lower Body Dressing: with modified independence;sit to/from stand;with adaptive equipment Pt Will Transfer to Toilet: with modified independence;ambulating;regular height toilet  OT Frequency: Min 2X/week    Co-evaluation              AM-PAC OT "6 Clicks" Daily Activity     Outcome Measure Help from another person eating meals?: None Help from another person taking care of personal grooming?: None Help from another person toileting, which includes using toliet, bedpan, or urinal?: A Little Help from another person bathing (including washing, rinsing, drying)?: A Lot Help from another person to put on and taking off regular upper body clothing?: None Help from another person to put on and taking off regular lower body clothing?: A Lot 6 Click Score: 19    End of Session Equipment Utilized During Treatment: Gait belt;Rolling walker (2 wheels) Nurse Communication: Mobility status  Activity Tolerance: Patient tolerated treatment well Patient left: in bed;with call bell/phone within reach;with bed alarm set  OT Visit Diagnosis: Unsteadiness on feet (R26.81);Pain Pain - Right/Left: Right Pain - part of body: Hip                Time: 1200-1217 OT Time Calculation (min): 17 min Charges:  OT General Charges $OT Visit: 1 Visit OT Evaluation $OT Eval Moderate Complexity: 1 Mod  11/18/2022  RP, OTR/L  Acute Rehabilitation Services  Office:  [phone]   Suzanna Obey 11/18/2022, 12:21 PM

## 2022-11-18 NOTE — TOC Initial Note (Signed)
Transition of Care [facility]) - Initial/Assessment Note    Patient Details  Name: Sharon Welch MRN: [id number] Date of Birth: 02-21-1971  Transition of Care [facility]) CM/SW Contact:    Epifanio Lesches, RN Phone Number: 11/18/2022, 1:26 PM  Clinical Narrative:                   - s/p THA, 5/26 From home with husband and son. Spoke with pt regarding d/c planning needs. Pt agreeable to home health services, HHPT. Pt without preference. Referral made with [facility] and accepted pending home health order/ face to face from MD. Referral made with [facility] for RW. Pt declined 3in1/[facility]. Equipment will be delivered to bedside prior to d/c. Pt with hospital f/u noted on AVS. Pt without RX med concerns or transportation issues.  TOC following and will continue assisting with needs...  Expected Discharge Plan: Home w Home Health Services Barriers to Discharge: Continued Medical Work up   Patient Goals and CMS Choice     Choice offered to / list presented to : Patient      Expected Discharge Plan and Services   Discharge Planning Services: CM Consult   Living arrangements for the past 2 months: Single Family Home                 DME Arranged: Walker rolling (declined 3in1/BSC) DME Agency: [employer] Date DME Agency Contacted: 11/18/22 Time DME Agency Contacted: 1318   HH Arranged: PT HH Agency: [facility] Date HH Agency Contacted: 11/18/22 Time HH Agency Contacted: 1317 Representative spoke with at [facility] Agency: Tresa Endo  Prior Living Arrangements/Services Living arrangements for the past 2 months: Single Family Home Lives with:: Spouse Patient language and need for interpreter reviewed:: Yes Do you feel safe going back to the place where you live?: Yes      Need for Family Participation in Patient Care: Yes (Comment) Care giver support system in place?: Yes (comment)   Criminal Activity/Legal Involvement Pertinent to Current  Situation/Hospitalization: No - Comment as needed  Activities of Daily Living Home Assistive Devices/Equipment: None ADL Screening (condition at time of admission) Patient's cognitive ability adequate to safely complete daily activities?: Yes Is the patient deaf or have difficulty hearing?: No Does the patient have difficulty seeing, even when wearing glasses/contacts?: No Does the patient have difficulty concentrating, remembering, or making decisions?: No Patient able to express need for assistance with ADLs?: Yes Does the patient have difficulty dressing or bathing?: No Independently performs ADLs?: Yes (appropriate for developmental age) Does the patient have difficulty walking or climbing stairs?: No Weakness of Legs: Left Weakness of Arms/Hands: None  Permission Sought/Granted   Permission granted to share information with : Yes, Verbal Permission Granted  Share Information with NAME: Sharon Welch  Spouse  [phone]           Emotional Assessment Appearance:: Appears stated age Attitude/Demeanor/Rapport: Gracious Affect (typically observed): Accepting Orientation: : Oriented to Self, Oriented to Place, Oriented to  Time, Oriented to Situation Alcohol / Substance Use: Not Applicable Psych Involvement: No (comment)  Admission diagnosis:  Hip fracture (HCC) [S72.009A] Closed fracture of left hip, initial encounter [facility]) [S72.002A] Patient Active Problem List   Diagnosis Date Noted   Transaminitis 11/16/2022   Hip fracture (HCC) 11/15/2022   GERD (gastroesophageal reflux disease) 11/15/2022   Tobacco use 11/15/2022   Idiopathic hypotension 07/19/2022   Slurred speech 07/19/2022   Underweight 07/19/2022   Hypotension 07/19/2022   Seizure disorder (HCC) 07/18/2022  Chronic migraine w/o aura w/o status migrainosus, not intractable 07/18/2022   Cerebral cavernoma 07/18/2022   Acute GI bleed due to bleeding pyloric/gastric ulcer 05/15/2019   PUD (peptic ulcer  disease)-EGD 05/15/2019 with bleeding pyloric/gastric ulcer, nonbleeding duodenal ulcers and gastritis    Hematemesis with nausea    Migraine headache 09/25/2015   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   Bilateral occipital neuralgia 01/02/2015   Migraine 01/02/2015   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/2014   DDD (degenerative disc disease), cervical 10/27/2014   DDD (degenerative disc disease), 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   DDD (degenerative disc disease), lumbosacral 10/27/2014   Hypokalemia 05/07/2013   Partial tear subscapularis tendon 12/27/2011   PCP:  Elfredia Nevins, MD Pharmacy:   [facility] - [place], [place] - [phone] [place] [address] Phone: [phone] Fax: [phone]     Social Determinants of Health (SDOH) Social History: SDOH Screenings   Food Insecurity: No Food Insecurity (11/16/2022)  Housing: Low Risk  (11/15/2022)  Transportation Needs: No Transportation Needs (11/16/2022)  Utilities: Not At Risk (11/16/2022)  Tobacco Use: High Risk (11/15/2022)   SDOH Interventions:     Readmission Risk Interventions     No data to display

## 2022-11-18 NOTE — Evaluation (Signed)
Physical Therapy Evaluation Patient Details Name: Sharon Welch MRN: [id number] DOB: [birth year] Today's Date: 11/18/2022  History of Present Illness  Admitted after fall resulting in L hip fx; s/p THA for surgical repair, WBAT;  history of seizure disorder, fibromyalgia, GERD, anxiety  Clinical Impression   Pt admitted with above diagnosis. Lives at home with her husband, in a single-level home with 4 steps to enter; Prior to admission, pt was able to manage independently, no assistive device needed; Presents to PT with L hip pain limiting activity tolerance and functional mobility; Currently needs mod assist to get up to EOB, min assist to stand and take steps with RW; Very motivated to be able to get home, and overall performing functional mobility well; Will order OT consult per protocol for ADLs;  Pt currently with functional limitations due to the deficits listed below (see PT Problem List). Pt will benefit from skilled PT to increase their independence and safety with mobility to allow discharge to the venue listed below.          Recommendations for follow up therapy are one component of a multi-disciplinary discharge planning process, led by the attending physician.  Recommendations may be updated based on patient status, additional functional criteria and insurance authorization.  Follow Up Recommendations       Assistance Recommended at Discharge Intermittent Supervision/Assistance  Patient can return home with the following  Assistance with cooking/housework;Help with stairs or ramp for entrance;A little help with bathing/dressing/bathroom;A little help with walking and/or transfers    Equipment Recommendations Rolling walker (2 wheels);BSC/3in1;Other (comment) (Youth-sized)  Recommendations for Other Services  OT consult (Will order per protocol)    Functional Status Assessment Patient has had a recent decline in their functional status and demonstrates the ability to make  significant improvements in function in a reasonable and predictable amount of time.     Precautions / Restrictions Precautions Precautions: Fall Precaution Comments: fall risk is present, but minimal Restrictions Weight Bearing Restrictions: No LLE Weight Bearing: Weight bearing as tolerated      Mobility  Bed Mobility Overal bed mobility: Needs Assistance Bed Mobility: Supine to Sit     Supine to sit: Mod assist     General bed mobility comments: Cues for technqiue; mod assist and use of bed pad to assist in scooting hips to EOB; good use of rails to pull to sit    Transfers Overall transfer level: Needs assistance Equipment used: Rolling walker (2 wheels) Transfers: Sit to/from Stand Sit to Stand: Min assist           General transfer comment: Min assist to rise and steady; cues for hand placement    Ambulation/Gait Ambulation/Gait assistance: Min guard, +2 safety/equipment (chair follow) Gait Distance (Feet): 30 Feet Assistive device: Rolling walker (2 wheels) Gait Pattern/deviations: Step-through pattern (emerging) Gait velocity: slowed     General Gait Details: Cues for sequence and to unweigh painful LLE in stance by pushing down into RW as needed  Stairs            Wheelchair Mobility    Modified Rankin (Stroke Patients Only)       Balance Overall balance assessment: Mild deficits observed, not formally tested                                           Pertinent Vitals/Pain Pain Assessment Pain Assessment: 0-10 Pain  Score: 7  Pain Location: L hip Pain Descriptors / Indicators: Aching Pain Intervention(s): Monitored during session    Home Living Family/patient expects to be discharged to:: Private residence Living Arrangements: Spouse/significant other Available Help at Discharge: Family;Available 24 hours/day Type of Home: House Home Access: Stairs to enter Entrance Stairs-Rails: Right Entrance Stairs-Number of  Steps: 4 total (2+2 to back entrance)   Home Layout: One level Home Equipment: None Additional Comments: states her mother in law has equipment if she were to need it    Prior Function Prior Level of Function : Independent/Modified Independent                     Hand Dominance   Dominant Hand: Right    Extremity/Trunk Assessment   Upper Extremity Assessment Upper Extremity Assessment: Overall WFL for tasks assessed    Lower Extremity Assessment Lower Extremity Assessment: LLE deficits/detail LLE Deficits / Details: Grossly decr AROM and strength. limited by pain postop; Able to accept weight onto LLE in stance       Communication   Communication: No difficulties  Cognition Arousal/Alertness: Awake/alert Behavior During Therapy: WFL for tasks assessed/performed Overall Cognitive Status: Within Functional Limits for tasks assessed                                          General Comments General comments (skin integrity, edema, etc.): Very motivated    Exercises     Assessment/Plan    PT Assessment Patient needs continued PT services  PT Problem List Decreased strength;Decreased range of motion;Decreased activity tolerance;Decreased balance;Decreased mobility;Decreased knowledge of use of DME;Decreased knowledge of precautions;Pain       PT Treatment Interventions DME instruction;Gait training;Stair training;Functional mobility training;Therapeutic activities;Therapeutic exercise;Balance training;Patient/family education    PT Goals (Current goals can be found in the Care Plan section)  Acute Rehab PT Goals Patient Stated Goal: To be able to get home PT Goal Formulation: With patient Time For Goal Achievement: 12/02/22 Potential to Achieve Goals: Good    Frequency Min 6X/week     Co-evaluation               AM-PAC PT "6 Clicks" Mobility  Outcome Measure Help needed turning from your back to your side while in a flat bed  without using bedrails?: A Little Help needed moving from lying on your back to sitting on the side of a flat bed without using bedrails?: A Lot Help needed moving to and from a bed to a chair (including a wheelchair)?: A Little Help needed standing up from a chair using your arms (e.g., wheelchair or bedside chair)?: A Little Help needed to walk in hospital room?: A Little Help needed climbing 3-5 steps with a railing? : A Lot 6 Click Score: 16    End of Session Equipment Utilized During Treatment: Gait belt Activity Tolerance: Patient tolerated treatment well Patient left: in chair;with call bell/phone within reach;with chair alarm set Nurse Communication: Mobility status PT Visit Diagnosis: Unsteadiness on feet (R26.81);Pain Pain - Right/Left: Left Pain - part of body: Hip    Time: 0904-0920 PT Time Calculation (min) (ACUTE ONLY): 16 min   Charges:   PT Evaluation $PT Eval Low Complexity: 1 Low          Van Clines, PT  Acute Rehabilitation Services Office [phone] Secure Chat welcomed   Levi Aland 11/18/2022, 11:11 AM

## 2022-11-19 ENCOUNTER — Encounter (HOSPITAL_COMMUNITY): Payer: Self-pay | Admitting: Orthopedic Surgery

## 2022-11-19 DIAGNOSIS — R7401 Elevation of levels of liver transaminase levels: Secondary | ICD-10-CM | POA: Diagnosis not present

## 2022-11-19 DIAGNOSIS — K219 Gastro-esophageal reflux disease without esophagitis: Secondary | ICD-10-CM | POA: Diagnosis not present

## 2022-11-19 DIAGNOSIS — S72002P Fracture of unspecified part of neck of left femur, subsequent encounter for closed fracture with malunion: Secondary | ICD-10-CM | POA: Diagnosis not present

## 2022-11-19 DIAGNOSIS — Z720 Tobacco use: Secondary | ICD-10-CM | POA: Diagnosis not present

## 2022-11-19 LAB — BASIC METABOLIC PANEL
Anion gap: 9 (ref 5–15)
BUN: 8 mg/dL (ref 6–20)
CO2: 27 mmol/L (ref 22–32)
Calcium: 7.9 mg/dL — ABNORMAL LOW (ref 8.9–10.3)
Chloride: 96 mmol/L — ABNORMAL LOW (ref 98–111)
Creatinine, Ser: 0.68 mg/dL (ref 0.44–1.00)
GFR, Estimated: >60 mL/min (ref >60)
Glucose, Bld: 100 mg/dL — ABNORMAL HIGH (ref 70–99)
Potassium: 3.6 mmol/L (ref 3.5–5.1)
Sodium: 132 mmol/L — ABNORMAL LOW (ref 135–145)

## 2022-11-19 LAB — CBC
HCT: 31.5 % — ABNORMAL LOW (ref 36.0–46.0)
Hemoglobin: 10.4 g/dL — ABNORMAL LOW (ref 12.0–15.0)
MCH: 30.8 pg (ref 26.0–34.0)
MCHC: 33 g/dL (ref 30.0–36.0)
MCV: 93.2 fL (ref 80.0–100.0)
Platelets: 328 10*3/uL (ref 150–400)
RBC: 3.38 MIL/uL — ABNORMAL LOW (ref 3.87–5.11)
RDW: 15 % (ref 11.5–15.5)
WBC: 11.9 10*3/uL — ABNORMAL HIGH (ref 4.0–10.5)
nRBC: 0 % (ref 0.0–0.2)

## 2022-11-19 LAB — MAGNESIUM: Magnesium: 2.2 mg/dL (ref 1.7–2.4)

## 2022-11-19 MED ORDER — ASPIRIN 81 MG PO TBEC: 81.0000 mg | DELAYED_RELEASE_TABLET | Freq: Two times a day (BID) | ORAL | 0 refills | Status: DC

## 2022-11-19 MED ORDER — FOLIC ACID 1 MG PO TABS: 1.0000 mg | ORAL_TABLET | Freq: Every day | ORAL | Status: DC

## 2022-11-19 MED ORDER — OXYCODONE HCL 5 MG PO TABS: 5.0000 mg | ORAL_TABLET | ORAL | 0 refills | Status: DC | PRN

## 2022-11-19 MED ORDER — POLYETHYLENE GLYCOL 3350 17 G PO PACK: 17.0000 g | PACK | Freq: Every day | ORAL | 0 refills | Status: DC | PRN

## 2022-11-19 MED ORDER — NICOTINE 14 MG/24HR TD PT24: 14.0000 mg | MEDICATED_PATCH | Freq: Every day | TRANSDERMAL | 0 refills | Status: DC

## 2022-11-19 NOTE — Progress Notes (Signed)
Physical Therapy Treatment Patient Details Name: Sharon Welch MRN: [id number] DOB: 12/22/1970 Today's Date: 11/19/2022   History of Present Illness Admitted after fall resulting in L hip fx; s/p THA for surgical repair, WBAT;  history of seizure disorder, fibromyalgia, GERD, anxiety    PT Comments    Continuing work on functional mobility and activity tolerance;  Session focused on functional transfers, gait, and activity tolerance; Notably, pt's back pain seemed to affect her activity tolerance more today than yesterday's session, but she was still able to get up and walk a comparable distance as yesterday; Anticipate smoother bed mobility as her pain lessens; Plan for stair training tomorrow   Recommendations for follow up therapy are one component of a multi-disciplinary discharge planning process, led by the attending physician.  Recommendations may be updated based on patient status, additional functional criteria and insurance authorization.  Follow Up Recommendations       Assistance Recommended at Discharge Intermittent Supervision/Assistance  Patient can return home with the following Assistance with cooking/housework;Help with stairs or ramp for entrance;A little help with bathing/dressing/bathroom;A little help with walking and/or transfers   Equipment Recommendations  Rolling walker (2 wheels);BSC/3in1;Other (comment) (Youth-sized, delivered to her room)    Recommendations for Other Services       Precautions / Restrictions Precautions Precautions: Fall Restrictions Weight Bearing Restrictions: No LLE Weight Bearing: Weight bearing as tolerated     Mobility  Bed Mobility Overal bed mobility: Needs Assistance Bed Mobility: Supine to Sit     Supine to sit: Mod assist     General bed mobility comments: Cues for technqiue; Light mod assist and use of bed pad to assist in scooting hips to EOB; good use of rails to pull to sit    Transfers Overall transfer level:  Needs assistance Equipment used: Rolling walker (2 wheels) Transfers: Bed to chair/wheelchair/BSC, Sit to/from Stand Sit to Stand: Min assist   Step pivot transfers: Min assist       General transfer comment: Min assist to rise and steady; cues for hand placement; pivot steps bed to [facility] first; then stood from [facility] for amb    Ambulation/Gait Ambulation/Gait assistance: Min guard Gait Distance (Feet): 35 Feet (to bathroom, to door, and back to recliner) Assistive device: Rolling walker (2 wheels) Gait Pattern/deviations: Step-through pattern (emerging)       General Gait Details: Cues for sequence and to unweigh painful LLE in stance by pushing down into RW as needed   Stairs             Wheelchair Mobility    Modified Rankin (Stroke Patients Only)       Balance     Sitting balance-Leahy Scale: Good       Standing balance-Leahy Scale: Poor                              Cognition Arousal/Alertness: Awake/alert Behavior During Therapy: WFL for tasks assessed/performed Overall Cognitive Status: Within Functional Limits for tasks assessed                                          Exercises      General Comments General comments (skin integrity, edema, etc.): Back pain seems to be effecting mobility more today than yesterday      Pertinent Vitals/Pain Pain Assessment Pain Assessment: Faces Faces  Pain Scale: Hurts even more Pain Location: L hip and back Pain Descriptors / Indicators: Aching, Tender (Trembling) Pain Intervention(s): Premedicated before session, Monitored during session    Home Living                          Prior Function            PT Goals (current goals can now be found in the care plan section) Acute Rehab PT Goals Patient Stated Goal: To be able to get home PT Goal Formulation: With patient Time For Goal Achievement: 12/02/22 Potential to Achieve Goals: Good Progress towards PT goals:  Progressing toward goals (slowly)    Frequency    Min 6X/week      PT Plan Current plan remains appropriate    Co-evaluation              AM-PAC PT "6 Clicks" Mobility   Outcome Measure  Help needed turning from your back to your side while in a flat bed without using bedrails?: A Little Help needed moving from lying on your back to sitting on the side of a flat bed without using bedrails?: A Lot Help needed moving to and from a bed to a chair (including a wheelchair)?: A Little Help needed standing up from a chair using your arms (e.g., wheelchair or bedside chair)?: A Little Help needed to walk in hospital room?: A Little Help needed climbing 3-5 steps with a railing? : A Lot 6 Click Score: 16    End of Session Equipment Utilized During Treatment: Gait belt Activity Tolerance: Patient tolerated treatment well (even with incr back pain) Patient left: in chair;with call bell/phone within reach;with chair alarm set Nurse Communication: Mobility status PT Visit Diagnosis: Unsteadiness on feet (R26.81);Pain Pain - Right/Left: Left Pain - part of body: Hip     Time: 4098-1191 PT Time Calculation (min) (ACUTE ONLY): 27 min  Charges:  $Gait Training: 8-22 mins $Therapeutic Activity: 8-22 mins                     Van Clines, PT  Acute Rehabilitation Services Office [phone] Secure Chat welcomed    Sharon Welch 11/19/2022, 10:29 AM

## 2022-11-19 NOTE — Progress Notes (Signed)
Physical Therapy Treatment Patient Details Name: Sharon Welch MRN: [id number] DOB: 04-07-1971 Today's Date: 11/19/2022   History of Present Illness Admitted after fall resulting in L hip fx; s/p THA for surgical repair, WBAT;  history of seizure disorder, fibromyalgia, GERD, anxiety    PT Comments    Pt received in supine and agreeable to session focused on stair training. Pt motivated to go home with husband and 2 daughters available to assist. Pt continues to require assist and increased time with bed mobility due to pain. Pt able to tolerate increased gait distance this session with one seated rest break due to fatigue. Pt able to perform 2 stair trials with min guard for safety and min A to correct initial buckling on the first step. Pt continues to be limited by decreased activity tolerance and pain, however with assist at home anticipate pt and family will be able to manage mobility needs.    Recommendations for follow up therapy are one component of a multi-disciplinary discharge planning process, led by the attending physician.  Recommendations may be updated based on patient status, additional functional criteria and insurance authorization.     Assistance Recommended at Discharge Intermittent Supervision/Assistance  Patient can return home with the following Assistance with cooking/housework;Help with stairs or ramp for entrance;A little help with bathing/dressing/bathroom;A little help with walking and/or transfers   Equipment Recommendations  Rolling walker (2 wheels);BSC/3in1;Other (comment)    Recommendations for Other Services       Precautions / Restrictions Precautions Precautions: Fall Precaution Comments: fall risk is present, but minimal Restrictions Weight Bearing Restrictions: No LLE Weight Bearing: Weight bearing as tolerated     Mobility  Bed Mobility Overal bed mobility: Needs Assistance Bed Mobility: Supine to Sit, Sit to Supine     Supine to sit: Min  assist Sit to supine: Min guard   General bed mobility comments: Cues for technique and increased time. Min A with HHA for trunk elevation due to not using bedrails to simulate home environment    Transfers Overall transfer level: Needs assistance Equipment used: Rolling walker (2 wheels) Transfers: Sit to/from Stand Sit to Stand: Min guard           General transfer comment: From low EOB and mat table with min guard for safety    Ambulation/Gait Ambulation/Gait assistance: Min guard Gait Distance (Feet): 65 Feet Assistive device: Rolling walker (2 wheels) Gait Pattern/deviations: Step-through pattern, Decreased stance time - right Gait velocity: slowed     General Gait Details: Pt demonstrating step-through pattern with upright posture, but limiting LLE WB with heavy BUE support on RW   Stairs Stairs: Yes Stairs assistance: Min guard Stair Management: One rail Left, Sideways Number of Stairs: 2 (x2) General stair comments: Pt able to perform 2 stair trials with cues for sequence and technique. Pt demonstrated initial LLE buckling due to pain, however was able to correct with min A and it did not occur again.       Balance Overall balance assessment: Needs assistance Sitting-balance support: Feet supported Sitting balance-Leahy Scale: Good Sitting balance - Comments: sitting EOB   Standing balance support: Reliant on assistive device for balance, During functional activity, Bilateral upper extremity supported Standing balance-Leahy Scale: Poor Standing balance comment: with RW support                            Cognition Arousal/Alertness: Awake/alert Behavior During Therapy: WFL for tasks assessed/performed Overall Cognitive Status:  Within Functional Limits for tasks assessed                                          Exercises      General Comments        Pertinent Vitals/Pain Pain Assessment Pain Assessment: Faces Faces  Pain Scale: Hurts even more Pain Location: L hip and back Pain Descriptors / Indicators: Aching, Tender Pain Intervention(s): Monitored during session, Repositioned     PT Goals (current goals can now be found in the care plan section) Acute Rehab PT Goals Patient Stated Goal: To be able to get home PT Goal Formulation: With patient Time For Goal Achievement: 12/02/22 Potential to Achieve Goals: Good Progress towards PT goals: Progressing toward goals    Frequency    Min 6X/week      PT Plan Current plan remains appropriate       AM-PAC PT "6 Clicks" Mobility   Outcome Measure  Help needed turning from your back to your side while in a flat bed without using bedrails?: A Little Help needed moving from lying on your back to sitting on the side of a flat bed without using bedrails?: A Little Help needed moving to and from a bed to a chair (including a wheelchair)?: A Little Help needed standing up from a chair using your arms (e.g., wheelchair or bedside chair)?: A Little Help needed to walk in hospital room?: A Little Help needed climbing 3-5 steps with a railing? : A Little 6 Click Score: 18    End of Session Equipment Utilized During Treatment: Gait belt Activity Tolerance: Patient tolerated treatment well Patient left: in bed;with call bell/phone within reach Nurse Communication: Mobility status PT Visit Diagnosis: Unsteadiness on feet (R26.81);Pain Pain - Right/Left: Left Pain - part of body: Hip     Time: 0981-1914 PT Time Calculation (min) (ACUTE ONLY): 20 min  Charges:  $Gait Training: 8-22 mins                     Johny Shock, PTA Acute Rehabilitation Services Secure Chat Preferred  [phone]    Johny Shock 11/19/2022, 4:13 PM

## 2022-11-19 NOTE — Progress Notes (Signed)
Nsg Discharge Note  Admit Date:  11/15/2022 Discharge date: 11/19/2022   Sharon Welch to be D/C'd Home per MD order.  AVS completed.   Patient/caregiver able to verbalize understanding.  Discharge Medication: Allergies as of 11/19/2022       Reactions   Ultram [tramadol] Other (See Comments)   Possible seizures   Aleve [naproxen] Other (See Comments)   Ulcers   Coconut (cocos Nucifera) Swelling   Depakote [divalproex Sodium] Other (See Comments)   Insomnia . (Changed from divalproex to Lamictal due to side effects)   Nsaids Other (See Comments)   History of bleeding ulcers.   Pineapple Other (See Comments)   Migraine        Medication List     STOP taking these medications    divalproex 500 MG 24 hr tablet Commonly known as: Depakote ER   HYDROcodone-acetaminophen 10-325 MG tablet Commonly known as: NORCO       TAKE these medications    acetaminophen 500 MG tablet Commonly known as: TYLENOL Take 1,000 mg by mouth every 6 (six) hours as needed for moderate pain or headache.   albuterol 108 (90 Base) MCG/ACT inhaler Commonly known as: VENTOLIN HFA Inhale 1-2 puffs into the lungs every 4 (four) hours as needed for wheezing or shortness of breath.   amitriptyline 25 MG tablet Commonly known as: ELAVIL Take 50 mg by mouth at bedtime.   aspirin EC 81 MG tablet Take 1 tablet (81 mg total) by mouth 2 (two) times daily. To prevent blood clots for 30 days after surgery.   dexlansoprazole 60 MG capsule Commonly known as: DEXILANT Take 60 mg by mouth daily.   diclofenac Sodium 1 % Gel Commonly known as: VOLTAREN Apply 2 g topically daily as needed (Pain).   folic acid 1 MG tablet Commonly known as: FOLVITE Take 1 tablet (1 mg total) by mouth daily.   hydrOXYzine 25 MG tablet Commonly known as: ATARAX Take 25 mg by mouth 4 (four) times daily as needed for anxiety.   lamoTRIgine 25 MG tablet Commonly known as: LAMICTAL 1 twice a day x1wk 2 twice a day x 2nd  wk 3 twice a day x3rd wk What changed:  how much to take how to take this when to take this additional instructions   lamoTRIgine 100 MG tablet Commonly known as: LaMICtal Take 1 tablet (100 mg total) by mouth 2 (two) times daily. What changed: Another medication with the same name was changed. Make sure you understand how and when to take each.   Linzess 145 MCG Caps capsule Generic drug: linaclotide Take 145 mcg by mouth daily before breakfast.   naloxone 4 MG/0.1ML Liqd nasal spray kit Commonly known as: NARCAN Place 1 spray into the nose as needed (opioid reversal).   nicotine 14 mg/24hr patch Commonly known as: NICODERM CQ - dosed in mg/24 hours Place 1 patch (14 mg total) onto the skin daily. Start taking on: Nov 20, 2022   oxyCODONE 5 MG immediate release tablet Commonly known as: Roxicodone Take 1 tablet (5 mg total) by mouth every 4 (four) hours as needed for severe pain (after surgery that is not resolved by your normal daily dose of Vicodin).   polyethylene glycol 17 g packet Commonly known as: MIRALAX / GLYCOLAX Take 17 g by mouth daily as needed for mild constipation.   promethazine 25 MG tablet Commonly known as: PHENERGAN Take 25 mg by mouth every 6 (six) hours as needed for nausea or vomiting.  rizatriptan 10 MG tablet Commonly known as: MAXALT Take 10 mg by mouth See admin instructions. 10 mg as needed for migraine, may repeat 10 mg in 2 hours if headache persists or recurs.   Savella 50 MG Tabs tablet Generic drug: Milnacipran Take 50 mg by mouth 2 (two) times daily.   tiZANidine 2 MG tablet Commonly known as: ZANAFLEX Take 2 mg by mouth 2 (two) times daily.               Durable Medical Equipment  (From admission, onward)           Start     Ordered   11/18/22 1342  For home use only DME Walker rolling  Once       Comments: YOUTH  Question Answer Comment  Walker: With 5 Inch Wheels   Patient needs a walker to treat with the  following condition Fx      11/18/22 1342   11/18/22 1124  For home use only DME 3 n 1  Once        11/18/22 1123   11/18/22 1124  For home use only DME Walker rolling  Once       Question Answer Comment  Walker: With 5 Inch Wheels   Patient needs a walker to treat with the following condition Debility      11/18/22 1123            Discharge Assessment: Vitals:   11/19/22 0823 11/19/22 1441  BP: (!) 149/87 137/77  Pulse: 98 98  Resp: 17 17  Temp: 98 F (36.7 C) 98.2 F (36.8 C)  SpO2: 100% 97%   Skin clean, dry and intact without evidence of skin break down, no evidence of skin tears noted. IV catheter discontinued intact. Site without signs and symptoms of complications - no redness or edema noted at insertion site, patient denies c/o pain - only slight tenderness at site.  Dressing with slight pressure applied.  D/c Instructions-Education: Discharge instructions given to patient/family with verbalized understanding. D/c education completed with patient/family including follow up instructions, medication list, d/c activities limitations if indicated, with other d/c instructions as indicated by MD - patient able to verbalize understanding, all questions fully answered. Patient instructed to return to ED, call 911, or call MD for any changes in condition.  Patient escorted via WC, and D/C home via private auto.  Kizzie Bane, RN 11/19/2022 5:05 PM

## 2022-11-19 NOTE — Progress Notes (Signed)
Subjective: Patient reports pain as mild.  Tolerating diet.  Urinating.   No CP, SOB.  Has mobilized some OOB with PT.  Objective:   VITALS:   Vitals:   11/18/22 1358 11/18/22 2009 11/19/22 0511 11/19/22 0823  BP: (!) 145/90 (!) 157/78 (!) 143/85 (!) 149/87  Pulse:  97 94 98  Resp: 15 16 16 17   Temp: 98.3 F (36.8 C) 99.5 F (37.5 C) 98.9 F (37.2 C) 98 F (36.7 C)  TempSrc: Oral     SpO2: 98% 98% 98% 100%  Weight:      Height:          Latest Ref Rng & Units 11/19/2022    1:28 AM 11/18/2022    2:06 AM 11/17/2022    1:14 AM  CBC  WBC 4.0 - 10.5 K/uL 11.9  13.8  7.1   Hemoglobin 12.0 - 15.0 g/dL 16.1  09.6  04.5   Hematocrit 36.0 - 46.0 % 31.5  34.7  36.6   Platelets 150 - 400 K/uL 328  370  302       Latest Ref Rng & Units 11/19/2022    1:28 AM 11/18/2022    2:06 AM 11/17/2022    1:14 AM  BMP  Glucose 70 - 99 mg/dL 409  93  68   BUN 6 - 20 mg/dL 8  7  8    Creatinine 0.44 - 1.00 mg/dL 8.11  9.14  7.82   Sodium 135 - 145 mmol/L 132  133  135   Potassium 3.5 - 5.1 mmol/L 3.6  3.4  3.7   Chloride 98 - 111 mmol/L 96  94  101   CO2 22 - 32 mmol/L 27  27  23    Calcium 8.9 - 10.3 mg/dL 7.9  8.6  8.1    Intake/Output      05/27 0701 05/28 0700 05/28 0701 05/29 0700   P.O. 1070    I.V. (mL/kg) 0 (0)    IV Piggyback 0    Total Intake(mL/kg) 1070 (23.8)    Urine (mL/kg/hr)     Blood     Total Output     Net +1070         Urine Occurrence 6 x    Stool Occurrence 1 x       Physical Exam: General: NAD.  Laying in bed, calm, comfortable Resp: No increased wob Cardio: regular rate and rhythm ABD soft Neurologically intact MSK Neurovascularly intact Sensation intact distally Intact pulses distally Dorsiflexion/Plantar flexion intact Incision: dressing C/D/I   Assessment: 2 Days Post-Op  S/P Procedure(s) (LRB): TOTAL HIP ARTHROPLASTY ANTERIOR APPROACH (Left) by Dr. Jewel Baize. [name] on 11/17/22  Principal Problem:   Hip fracture (HCC) Active  Problems:   Hypokalemia   Seizure disorder (HCC)   GERD (gastroesophageal reflux disease)   Tobacco use   Transaminitis   Plan:  Advance diet Up with therapy Incentive Spirometry Elevate and Apply ice  Weightbearing: WBAT LLE Insicional and dressing care: Dressings left intact until follow-up and Reinforce dressings as needed Orthopedic device(s): None Showering: Keep dressing dry VTE prophylaxis: Aspirin 81mg  BID  x 30 days postop , SCDs, ambulation Pain control: PRN Follow - up plan: 2 weeks Contact information:  Margarita Rana MD, Levester Fresh PA-C  Dispo:  Home with HHPT. Ok to discharge from orthopedic standpoint whenever medically ready. Info for calling to make post-op office f/u in chart. Will send pain med and ASA to pharmacy for discharge meds.  Jenne Pane, PA-C Office [phone] 11/19/2022, 1:00 PM

## 2022-11-19 NOTE — Plan of Care (Signed)

## 2022-11-19 NOTE — TOC Progression Note (Signed)
Transition of Care (TOC) - Progression Note   Tresa Endo with [facility] aware discharge is today. Nurse confirmed walker for home is in room. Patient declined 3 in 1  Patient Details  Name: Sharon Welch MRN: [id number] Date of Birth: [birth year]  Transition of Care [facility]) CM/SW Contact  Wendee Hata, Adria Devon, RN Phone Number: 11/19/2022, 10:07 AM  Clinical Narrative:       Expected Discharge Plan: Home w Home Health Services Barriers to Discharge: Continued Medical Work up  Expected Discharge Plan and Services   Discharge Planning Services: CM Consult   Living arrangements for the past 2 months: Single Family Home                 DME Arranged: Dan Humphreys rolling (declined 3in1/BSC) DME Agency: [employer] Date DME Agency Contacted: 11/18/22 Time DME Agency Contacted: [phone]   HH Arranged: PT HH Agency: [facility] Date HH Agency Contacted: 11/18/22 Time HH Agency Contacted: [phone] Representative spoke with at [facility] Agency: Tresa Endo   Social Determinants of Health (SDOH) Interventions SDOH Screenings   Food Insecurity: No Food Insecurity (11/16/2022)  Housing: Low Risk  (11/15/2022)  Transportation Needs: No Transportation Needs (11/16/2022)  Utilities: Not At Risk (11/16/2022)  Tobacco Use: High Risk (11/19/2022)    Readmission Risk Interventions     No data to display

## 2022-11-19 NOTE — Discharge Summary (Signed)
Physician Discharge Summary  Sharon Welch WUJ:[id number] DOB: 1970-07-07 DOA: 11/15/2022  PCP: Sharon Nevins, MD  Admit date: 11/15/2022 Discharge date: 11/19/2022  Time spent: 60 minutes  Recommendations for Outpatient Follow-up:  Follow-up with Dr. Margarita Welch in 2 weeks. Follow-up with Sharon Nevins, MD in 2 to 3 weeks.  On follow-up patient will need a comprehensive metabolic profile done to follow-up on electrolytes, renal function, LFTs.  Patient will need a CBC done to follow-up on counts. Follow-up with Dr. Terrace Welch, neurology as previously scheduled.   Discharge Diagnoses:  Principal Problem:   Hip fracture (HCC) Active Problems:   Hypokalemia   Seizure disorder (HCC)   GERD (gastroesophageal reflux disease)   Tobacco use   Transaminitis   Discharge Condition: Stable and improved.  Diet recommendation: Regular  Filed Weights   11/15/22 1706  Weight: 44.9 kg    History of present illness:  HPI per SharonZierle-Ghosh  Sharon Welch is a 52 y.o. female with medical history significant of seizure disorder, fibromyalgia, GERD, anxiety, and more presents the ED with a chief complaint of fall.  Patient reports that she has been having seizures which caused her to fall.  She fell down a flight of stairs last week.  2 days ago she fell, and when she woke up she knew she had broken her hip.  Patient reports that every time this happens, she just wakes up on the ground.  She does not have any recollection of the fall itself.  She reports that she was going to have 24-hour EEG monitoring, so she did not want to miss that by coming into the hospital for broken hip.  She did her EEG monitoring and then came into the hospital.  She reports that she has been in so much pain all she has been able to do is lay on the couch.  She has not even been able to ambulate to the bathroom and has been using a depends.  She slept all day Thursday, because it was only when she could deal with the pain.  She  reports very little p.o. intake over Thursday and Friday due to the pain.  She did have a normal meal on Wednesday.  Patient reports little tightness in her chest that is normal for her anxiety symptoms.  She has no other complaints on review of systems.   Patient does smoke half a pack per day.  She does not drink, does not use illicit drugs.  She is vaccinated for COVID.  Patient is full code.  Hospital Course:  #1 left femoral neck fracture -Secondary to mechanical fall from proposed seizure-like activity per patient's history. -Orthopedics consulted whom assessed the patient and recommended operative fixation per Sharon Welch. -Patient status post total hip arthroplasty on the left per orthopedics, Sharon Welch today 11/17/2022. -PT/OT has assessed patient and recommended home health therapies. -Patient was discharged home on aspirin 81 mg twice daily for DVT prophylaxis as well as pain management per orthopedics. -Outpatient follow-up with orthopedics 2 weeks postdischarge.   2.  Seizure disorder -Patient reported had been having seizures leading to multiple falls prior to admission. -Patient states no longer takes Depakote only on Lamictal. -Spoke with neurologist/consulted neurology who reviewed chart and reviewed care everywhere which revealed that patient with a history of psychogenic nonepileptic seizures, diagnosed after extensive workup at [facility] including evaluation in ED EMU. -Per neurology patient supposed to be on Lamictal and Depakote but not taking Depakote and recommended continuation of Lamictal for  now, seizure precautions with outpatient follow-up with primary neurologist. -Per neurology if patient has any further seizure episodes inpatient may consider LTM EEG. -Patient maintained on home regimen Lamictal 100 mg twice daily, no seizures during the hospitalization and remained in stable condition. -Outpatient follow-up with primary neurologist, Dr. Terrace Welch as previously scheduled.     3.  Tobacco abuse -Tobacco cessation.   -Patient placed on a nicotine patch.     4.  GERD -Patient maintained on PPI.   5.  Hypokalemia -Repleted during the hospitalization.   -Outpatient follow-up with PCP.     6.  Transaminitis ??  Etiology -Abdominal ultrasound prior cholecystectomy, dilated common bile duct which may be related to postcholecystectomy state.  Cannot exclude distal obstructing process as the distal duct is obscured by overlying bowel gas.  -Tylenol level noted at < 10. -Acute hepatitis panel ordered negative. -Transaminitis improved with hydration. -Outpatient follow-up with PCP.    Procedures: CT head CT C-spine 11/15/2022 Plain films of the left knee 11/15/2022 Plain films of the left hip and pelvis 11/15/2022 Abdominal ultrasound 11/16/2022-- Total left hip arthroplasty: Per Sharon Welch 11/17/2022  Consultations: Orthopedics: Sharon Welch 11/16/2022 Neurology    Discharge Exam: Vitals:   11/19/22 0823 11/19/22 1441  BP: (!) 149/87 137/77  Pulse: 98 98  Resp: 17 17  Temp: 98 F (36.7 C) 98.2 F (36.8 C)  SpO2: 100% 97%    General: NAD. Cardiovascular: RRR no murmurs rubs or gallops.  No JVD.  No lower extremity edema. Respiratory: Lungs clear to auscultation bilaterally.  No wheezes, no crackles, no rhonchi.  Fair air movement.  Speaking in full sentences.  Discharge Instructions   Discharge Instructions     Diet general   Complete by: As directed    Increase activity slowly   Complete by: As directed       Allergies as of 11/19/2022       Reactions   Ultram [tramadol] Other (See Comments)   Possible seizures   Aleve [naproxen] Other (See Comments)   Ulcers   Coconut (cocos Nucifera) Swelling   Depakote [divalproex Sodium] Other (See Comments)   Insomnia . (Changed from divalproex to Lamictal due to side effects)   Nsaids Other (See Comments)   History of bleeding ulcers.   Pineapple Other (See Comments)   Migraine         Medication List     STOP taking these medications    divalproex 500 MG 24 hr tablet Commonly known as: Depakote ER   HYDROcodone-acetaminophen 10-325 MG tablet Commonly known as: NORCO       TAKE these medications    acetaminophen 500 MG tablet Commonly known as: TYLENOL Take 1,000 mg by mouth every 6 (six) hours as needed for moderate pain or headache.   albuterol 108 (90 Base) MCG/ACT inhaler Commonly known as: VENTOLIN HFA Inhale 1-2 puffs into the lungs every 4 (four) hours as needed for wheezing or shortness of breath.   amitriptyline 25 MG tablet Commonly known as: ELAVIL Take 50 mg by mouth at bedtime.   aspirin EC 81 MG tablet Take 1 tablet (81 mg total) by mouth 2 (two) times daily. To prevent blood clots for 30 days after surgery.   dexlansoprazole 60 MG capsule Commonly known as: DEXILANT Take 60 mg by mouth daily.   diclofenac Sodium 1 % Gel Commonly known as: VOLTAREN Apply 2 g topically daily as needed (Pain).   folic acid 1 MG tablet Commonly known as: FOLVITE Take  1 tablet (1 mg total) by mouth daily.   hydrOXYzine 25 MG tablet Commonly known as: ATARAX Take 25 mg by mouth 4 (four) times daily as needed for anxiety.   lamoTRIgine 25 MG tablet Commonly known as: LAMICTAL 1 twice a day x1wk 2 twice a day x 2nd wk 3 twice a day x3rd wk What changed:  how much to take how to take this when to take this additional instructions   lamoTRIgine 100 MG tablet Commonly known as: LaMICtal Take 1 tablet (100 mg total) by mouth 2 (two) times daily. What changed: Another medication with the same name was changed. Make sure you understand how and when to take each.   Linzess 145 MCG Caps capsule Generic drug: linaclotide Take 145 mcg by mouth daily before breakfast.   naloxone 4 MG/0.1ML Liqd nasal spray kit Commonly known as: NARCAN Place 1 spray into the nose as needed (opioid reversal).   nicotine 14 mg/24hr patch Commonly known as:  NICODERM CQ - dosed in mg/24 hours Place 1 patch (14 mg total) onto the skin daily. Start taking on: Nov 20, 2022   oxyCODONE 5 MG immediate release tablet Commonly known as: Roxicodone Take 1 tablet (5 mg total) by mouth every 4 (four) hours as needed for severe pain (after surgery that is not resolved by your normal daily dose of Vicodin).   polyethylene glycol 17 g packet Commonly known as: MIRALAX / GLYCOLAX Take 17 g by mouth daily as needed for mild constipation.   promethazine 25 MG tablet Commonly known as: PHENERGAN Take 25 mg by mouth every 6 (six) hours as needed for nausea or vomiting.   rizatriptan 10 MG tablet Commonly known as: MAXALT Take 10 mg by mouth See admin instructions. 10 mg as needed for migraine, may repeat 10 mg in 2 hours if headache persists or recurs.   Savella 50 MG Tabs tablet Generic drug: Milnacipran Take 50 mg by mouth 2 (two) times daily.   tiZANidine 2 MG tablet Commonly known as: ZANAFLEX Take 2 mg by mouth 2 (two) times daily.               Durable Medical Equipment  (From admission, onward)           Start     Ordered   11/18/22 1342  For home use only DME Walker rolling  Once       Comments: YOUTH  Question Answer Comment  Walker: With 5 Inch Wheels   Patient needs a walker to treat with the following condition Fx      11/18/22 1342   11/18/22 1124  For home use only DME 3 n 1  Once        11/18/22 1123   11/18/22 1124  For home use only DME Walker rolling  Once       Question Answer Comment  Walker: With 5 Inch Wheels   Patient needs a walker to treat with the following condition Debility      11/18/22 1123           Allergies  Allergen Reactions   Ultram [Tramadol] Other (See Comments)    Possible seizures   Aleve [Naproxen] Other (See Comments)    Ulcers   Coconut (Cocos Nucifera) Swelling   Depakote [Divalproex Sodium] Other (See Comments)    Insomnia . (Changed from divalproex to Lamictal due to  side effects)   Nsaids Other (See Comments)    History of bleeding ulcers.  Pineapple Other (See Comments)    Migraine    Follow-up Information     Sheral Apley, MD. Schedule an appointment as soon as possible for a visit in 2 week(s).   Specialty: Orthopedic Surgery Why: for post-op check up Contact information: [address] [place] [address] [phone]         Sharon Nevins, MD. Schedule an appointment as soon as possible for a visit in 2 week(s).   Specialty: Internal Medicine Why: Follow-up in 2 to 3 weeks Contact information: [address] [place] [address] [phone]         [facility] Follow up.   Specialty: [facility] Contact information: [address] [place] [address] [place] [address] [phone]         Levert Feinstein, MD Follow up.   Specialty: Neurology Why: Follow-up as scheduled. Contact information: [address] [place] [address] [phone]                  The results of significant diagnostics from this hospitalization (including imaging, microbiology, ancillary and laboratory) are listed below for reference.    Significant Diagnostic Studies: DG HIP UNILAT WITH PELVIS 1V LEFT  Result Date: 11/17/2022 CLINICAL DATA:  Elective surgery. Intraoperative fluoroscopy for total left hip arthroplasty. EXAM: DG HIP (WITH OR WITHOUT PELVIS) 1V*L* COMPARISON:  Pelvis and left hip radiographs 11/15/2022 FINDINGS: Images were performed intraoperatively without the presence of a radiologist. New total left hip arthroplasty. No hardware complication is seen. Total fluoroscopy images: 2 Total fluoroscopy time: 21 seconds Total dose: Radiation Exposure Index (as provided by the fluoroscopic device): 2.01 mGy air Kerma Please see intraoperative findings for further detail. IMPRESSION: Intraoperative fluoroscopy for total left hip arthroplasty. Electronically Signed   By:  Neita Garnet M.D.   On: 11/17/2022 10:35   DG C-Arm 1-60 Min-No Report  Result Date: 11/17/2022 Fluoroscopy was utilized by the requesting physician.  No radiographic interpretation.   US Abdomen Complete  Result Date: 11/16/2022 CLINICAL DATA:  Transaminitis EXAM: ABDOMEN ULTRASOUND COMPLETE COMPARISON:  None Available. FINDINGS: Gallbladder: Prior cholecystectomy Common bile duct: Diameter: Dilated measuring 17 mm. The distal duct is not visualized due to overlying bowel gas. Liver: No focal lesion identified. Within normal limits in parenchymal echogenicity. Portal vein is patent on color Doppler imaging with normal direction of blood flow towards the liver. IVC: No abnormality visualized. Pancreas: Not visualized due to overlying bowel gas Spleen: Size and appearance within normal limits. Right Kidney: Length: Atrophic, 8.5 cm. Echogenicity within normal limits. No mass or hydronephrosis visualized. Left Kidney: Length: Atrophic, 8.6 cm. Echogenicity within normal limits. No mass or hydronephrosis visualized. Abdominal aorta: No aneurysm visualized. Other findings: None. IMPRESSION: Prior cholecystectomy. Dilated common bile duct which may be related to post cholecystectomy state. Cannot exclude distal obstructing process as the distal duct is obscured by overlying bowel gas. Consider further evaluation with MRCP or ERCP if felt clinically indicated. Electronically Signed   By: Charlett Nose M.D.   On: 11/16/2022 19:59   CT Head Wo Contrast  Result Date: 11/15/2022 CLINICAL DATA:  Multiple falls EXAM: CT HEAD WITHOUT CONTRAST CT CERVICAL SPINE WITHOUT CONTRAST TECHNIQUE: Multidetector CT imaging of the head and cervical spine was performed following the standard protocol without intravenous contrast. Multiplanar CT image reconstructions of the cervical spine were also generated. RADIATION DOSE REDUCTION: This exam was performed according to the departmental dose-optimization program which includes  automated exposure control,  adjustment of the mA and/or kV according to patient size and/or use of iterative reconstruction technique. COMPARISON:  11/08/2022 FINDINGS: CT HEAD FINDINGS Brain: No evidence of acute infarction, hemorrhage, hydrocephalus, extra-axial collection or mass lesion/mass effect. Vascular: No hyperdense vessel or unexpected calcification. Skull: Normal. Negative for fracture or focal lesion. Sinuses/Orbits: No acute finding. Other: None. CT CERVICAL SPINE FINDINGS Alignment: Normal. Skull base and vertebrae: No acute fracture. No primary bone lesion or focal pathologic process. Soft tissues and spinal canal: No prevertebral fluid or swelling. No visible canal hematoma. Disc levels: Anterior cervical discectomy and fusion at C6-C7. Otherwise intact disc spaces. Upper chest: Negative. Other: None. IMPRESSION: 1. No acute intracranial pathology. 2. No fracture or static subluxation of the cervical spine. 3. Anterior cervical discectomy and fusion at C6-C7. Electronically Signed   By: Jearld Lesch M.D.   On: 11/15/2022 19:35   CT Cervical Spine Wo Contrast  Result Date: 11/15/2022 CLINICAL DATA:  Multiple falls EXAM: CT HEAD WITHOUT CONTRAST CT CERVICAL SPINE WITHOUT CONTRAST TECHNIQUE: Multidetector CT imaging of the head and cervical spine was performed following the standard protocol without intravenous contrast. Multiplanar CT image reconstructions of the cervical spine were also generated. RADIATION DOSE REDUCTION: This exam was performed according to the departmental dose-optimization program which includes automated exposure control, adjustment of the mA and/or kV according to patient size and/or use of iterative reconstruction technique. COMPARISON:  11/08/2022 FINDINGS: CT HEAD FINDINGS Brain: No evidence of acute infarction, hemorrhage, hydrocephalus, extra-axial collection or mass lesion/mass effect. Vascular: No hyperdense vessel or unexpected calcification. Skull: Normal.  Negative for fracture or focal lesion. Sinuses/Orbits: No acute finding. Other: None. CT CERVICAL SPINE FINDINGS Alignment: Normal. Skull base and vertebrae: No acute fracture. No primary bone lesion or focal pathologic process. Soft tissues and spinal canal: No prevertebral fluid or swelling. No visible canal hematoma. Disc levels: Anterior cervical discectomy and fusion at C6-C7. Otherwise intact disc spaces. Upper chest: Negative. Other: None. IMPRESSION: 1. No acute intracranial pathology. 2. No fracture or static subluxation of the cervical spine. 3. Anterior cervical discectomy and fusion at C6-C7. Electronically Signed   By: Jearld Lesch M.D.   On: 11/15/2022 19:35   DG Knee 2 Views Left  Result Date: 11/15/2022 CLINICAL DATA:  Fall 2 days ago EXAM: LEFT KNEE - 1-2 VIEW COMPARISON:  None Available. FINDINGS: No evidence of fracture, dislocation, or joint effusion. No evidence of arthropathy or other focal bone abnormality. Soft tissues are unremarkable. IMPRESSION: No fracture or dislocation of the left knee. Electronically Signed   By: Jearld Lesch M.D.   On: 11/15/2022 17:48   DG Hip Unilat With Pelvis 2-3 Views Left  Result Date: 11/15/2022 CLINICAL DATA:  Fall EXAM: DG HIP (WITH OR WITHOUT PELVIS) 2-3V LEFT COMPARISON:  11/08/2022 FINDINGS: Hardware in the lumbar spine. Pubic symphysis and rami appear intact. Acute left femoral neck fracture with cranial displacement of the distal femoral fracture fragment. Femoral head projects in joint. IMPRESSION: Acute displaced left femoral neck fracture. Electronically Signed   By: Jasmine Pang M.D.   On: 11/15/2022 17:48   DG Chest Portable 1 View  Result Date: 11/08/2022 CLINICAL DATA:  Fall EXAM: PORTABLE CHEST 1 VIEW COMPARISON:  07/18/2022 FINDINGS: The heart size and mediastinal contours are within normal limits. No focal airspace consolidation, pleural effusion, or pneumothorax. Bones appear demineralized. Partially imaged thoracolumbar fusion  hardware as well as lower cervical ACDF hardware. IMPRESSION: No active disease. Electronically Signed   By: Duanne Guess D.O.  On: 11/08/2022 18:07   CT Cervical Spine Wo Contrast  Result Date: 11/08/2022 CLINICAL DATA:  Fall.  Trauma to head. EXAM: CT CERVICAL SPINE WITHOUT CONTRAST TECHNIQUE: Multidetector CT imaging of the cervical spine was performed without intravenous contrast. Multiplanar CT image reconstructions were also generated. RADIATION DOSE REDUCTION: This exam was performed according to the departmental dose-optimization program which includes automated exposure control, adjustment of the mA and/or kV according to patient size and/or use of iterative reconstruction technique. COMPARISON:  CT of the cervical spine 09/17/2018 FINDINGS: Alignment: No significant listhesis is present. Cervical lordosis is preserved. Skull base and vertebrae: The craniocervical junction is normal. Vertebral body heights are normal. No acute fractures are present. Soft tissues and spinal canal: No prevertebral fluid or swelling. No visible canal hematoma. Disc levels: Solid anterior fusion is present at C6-7. No focal stenosis is present otherwise. Upper chest: Centrilobular emphysematous changes are present. Scarring is present at the lung apices without significant interval change. IMPRESSION: 1. No acute fracture or traumatic subluxation. 2. Solid anterior fusion at C6-7. 3.  Emphysema (ICD10-J43.9). Electronically Signed   By: Marin Roberts M.D.   On: 11/08/2022 17:48   CT Head Wo Contrast  Result Date: 11/08/2022 CLINICAL DATA:  Fall. Trauma to head. Possible loss of consciousness. EXAM: CT HEAD WITHOUT CONTRAST TECHNIQUE: Contiguous axial images were obtained from the base of the skull through the vertex without intravenous contrast. RADIATION DOSE REDUCTION: This exam was performed according to the departmental dose-optimization program which includes automated exposure control, adjustment of  the mA and/or kV according to patient size and/or use of iterative reconstruction technique. COMPARISON:  CT head without contrast 09/21/2022. MR head without and with contrast 08/01/2022. FINDINGS: Brain: No acute infarct, hemorrhage, or mass lesion is present. No significant white matter lesions are present. Deep brain nuclei are within normal limits. The ventricles are of normal size. No significant extraaxial fluid collection is present. Vascular: No hyperdense vessel or unexpected calcification. Skull: No significant extracranial soft tissue lesion is present. Calvarium is intact. No focal lytic or blastic lesions are present. Sinuses/Orbits: The paranasal sinuses and mastoid air cells are clear. The globes and orbits are within normal limits. IMPRESSION: Negative CT of the head. Electronically Signed   By: Marin Roberts M.D.   On: 11/08/2022 17:45   DG HIP UNILAT WITH PELVIS 2-3 VIEWS LEFT  Result Date: 11/08/2022 CLINICAL DATA:  Left hip pain EXAM: DG HIP (WITH OR WITHOUT PELVIS) 2-3V LEFT COMPARISON:  12/07/2016 FINDINGS: Hardware in the lumbar spine. SI joints are non widened. Pubic symphysis and rami appear intact. IMPRESSION: Negative. Electronically Signed   By: Jasmine Pang M.D.   On: 11/08/2022 17:34   DG HIP UNILAT WITH PELVIS 2-3 VIEWS LEFT  Result Date: 11/08/2022 CLINICAL DATA:  Fall left hip pain EXAM: DG HIP (WITH OR WITHOUT PELVIS) 2-3V LEFT COMPARISON:  None Available. FINDINGS: Partially visualized lumbar spine hardware. SI joints are non widened. Pubic symphysis and rami appear intact. No fracture or malalignment IMPRESSION: No acute osseous abnormality. Electronically Signed   By: Jasmine Pang M.D.   On: 11/08/2022 17:24    Microbiology: Recent Results (from the past 240 hour(s))  Surgical pcr screen     Status: None   Collection Time: 11/16/22 10:49 PM   Specimen: Nasal Mucosa; Nasal Swab  Result Value Ref Range Status   MRSA, PCR NEGATIVE NEGATIVE Final    Staphylococcus aureus NEGATIVE NEGATIVE Final    Comment: (NOTE) The Xpert SA Assay (FDA  approved for NASAL specimens in patients 5 years of age and older), is one component of a comprehensive surveillance program. It is not intended to diagnose infection nor to guide or monitor treatment. Performed at [facility], [address], [place], [place] [address]      Labs: Basic Metabolic Panel: Recent Labs  Lab 11/15/22 1901 11/16/22 0159 11/17/22 0114 11/18/22 0206 11/19/22 0128  NA 138 136 135 133* 132*  K 2.9* 5.0 3.7 3.4* 3.6  CL 97* 101 101 94* 96*  CO2 22 20* 23 27 27   GLUCOSE 76 90 68* 93 100*  BUN 19 14 8 7 8   CREATININE 0.77 0.89 0.74 0.84 0.68  CALCIUM 8.9 8.6* 8.1* 8.6* 7.9*  MG  --  1.9 1.8 1.7 2.2  PHOS  --   --   --  3.2  --    Liver Function Tests: Recent Labs  Lab 11/16/22 0159 11/17/22 0114 11/18/22 0206  AST 417* 100* 72*  ALT 141* 81* 75*  ALKPHOS 1,650* 1,228* 1,354*  BILITOT 2.0* 1.1 0.9  PROT 6.3* 5.3* 6.3*  ALBUMIN 3.0* 2.5* 3.0*   No results for input(s): "LIPASE", "AMYLASE" in the last 168 hours. No results for input(s): "AMMONIA" in the last 168 hours. CBC: Recent Labs  Lab 11/15/22 1901 11/16/22 0159 11/17/22 0114 11/18/22 0206 11/19/22 0128  WBC 8.6 10.5 7.1 13.8* 11.9*  NEUTROABS  --  6.1 3.9  --   --   HGB 13.3 12.8 11.8* 11.5* 10.4*  HCT 40.1 38.7 36.6 34.7* 31.5*  MCV 95.0 96.3 96.6 94.0 93.2  PLT 387 393 302 370 328   Cardiac Enzymes: Recent Labs  Lab 11/15/22 1901 11/17/22 0930  CKTOTAL 109 32*   BNP: BNP (last 3 results) No results for input(s): "BNP" in the last 8760 hours.  ProBNP (last 3 results) No results for input(s): "PROBNP" in the last 8760 hours.  CBG: Recent Labs  Lab 11/17/22 0530 11/17/22 0840  GLUCAP 70 95       Signed:  Ramiro Harvest MD.  [facility] 11/19/2022, 3:04 PM

## 2022-11-19 NOTE — TOC CAGE-AID Note (Signed)
Transition of Care [facility]) - CAGE-AID Screening   Patient Details  Name: Sharon Welch MRN: [id number] Date of Birth: [birth year]  [facility] CM/SW Contact:    Leota Sauers, RN Phone Number: 11/19/2022, 2:20 AM   Clinical Narrative:  Patient denies use of alcohol and illicit drugs. Education not offered at this time.  CAGE-AID Screening:    Have You Ever Felt You Ought to Cut Down on Your Drinking or Drug Use?: No Have People Annoyed You By Critizing Your Drinking Or Drug Use?: No Have You Felt Bad Or Guilty About Your Drinking Or Drug Use?: No Have You Ever Had a Drink or Used Drugs First Thing In The Morning to Steady Your Nerves or to Get Rid of a Hangover?: No CAGE-AID Score: 0  Substance Abuse Education Offered: No

## 2022-11-26 ENCOUNTER — Other Ambulatory Visit (HOSPITAL_COMMUNITY): Payer: Self-pay | Admitting: Internal Medicine

## 2022-11-26 ENCOUNTER — Ambulatory Visit (HOSPITAL_COMMUNITY)
Admission: RE | Admit: 2022-11-26 | Discharge: 2022-11-26 | Disposition: A | Discharge status: Home / Self Care | Payer: 59 | Source: Ambulatory Visit | Attending: Internal Medicine | Admitting: Internal Medicine

## 2022-11-26 DIAGNOSIS — R079 Chest pain, unspecified: Secondary | ICD-10-CM | POA: Diagnosis present

## 2022-12-04 ENCOUNTER — Other Ambulatory Visit: Payer: Self-pay

## 2022-12-04 ENCOUNTER — Emergency Department (HOSPITAL_COMMUNITY)
Admission: EM | Admit: 2022-12-04 | Discharge: 2022-12-04 | Disposition: A | Discharge status: Home / Self Care | Payer: 59 | Attending: Emergency Medicine | Admitting: Emergency Medicine

## 2022-12-04 ENCOUNTER — Emergency Department (HOSPITAL_COMMUNITY): Payer: 59

## 2022-12-04 ENCOUNTER — Encounter (HOSPITAL_COMMUNITY): Payer: Self-pay

## 2022-12-04 DIAGNOSIS — Z7982 Long term (current) use of aspirin: Secondary | ICD-10-CM | POA: Insufficient documentation

## 2022-12-04 DIAGNOSIS — E876 Hypokalemia: Secondary | ICD-10-CM | POA: Diagnosis not present

## 2022-12-04 DIAGNOSIS — R079 Chest pain, unspecified: Secondary | ICD-10-CM | POA: Diagnosis not present

## 2022-12-04 DIAGNOSIS — R002 Palpitations: Secondary | ICD-10-CM

## 2022-12-04 DIAGNOSIS — R0602 Shortness of breath: Secondary | ICD-10-CM | POA: Diagnosis not present

## 2022-12-04 DIAGNOSIS — R748 Abnormal levels of other serum enzymes: Secondary | ICD-10-CM | POA: Insufficient documentation

## 2022-12-04 DIAGNOSIS — R000 Tachycardia, unspecified: Secondary | ICD-10-CM | POA: Diagnosis not present

## 2022-12-04 LAB — CBC
HCT: 40.6 % (ref 36.0–46.0)
Hemoglobin: 13.4 g/dL (ref 12.0–15.0)
MCH: 31.3 pg (ref 26.0–34.0)
MCHC: 33 g/dL (ref 30.0–36.0)
MCV: 94.9 fL (ref 80.0–100.0)
Platelets: 340 10*3/uL (ref 150–400)
RBC: 4.28 MIL/uL (ref 3.87–5.11)
RDW: 14.3 % (ref 11.5–15.5)
WBC: 6.7 10*3/uL (ref 4.0–10.5)
nRBC: 0 % (ref 0.0–0.2)

## 2022-12-04 LAB — TROPONIN I (HIGH SENSITIVITY)
Troponin I (High Sensitivity): <2 ng/L (ref <18)
Troponin I (High Sensitivity): <2 ng/L (ref <18)

## 2022-12-04 LAB — BASIC METABOLIC PANEL
Anion gap: 11 (ref 5–15)
BUN: 17 mg/dL (ref 6–20)
CO2: 21 mmol/L — ABNORMAL LOW (ref 22–32)
Calcium: 8.7 mg/dL — ABNORMAL LOW (ref 8.9–10.3)
Chloride: 106 mmol/L (ref 98–111)
Creatinine, Ser: 0.82 mg/dL (ref 0.44–1.00)
GFR, Estimated: >60 mL/min (ref >60)
Glucose, Bld: 96 mg/dL (ref 70–99)
Potassium: 2.8 mmol/L — ABNORMAL LOW (ref 3.5–5.1)
Sodium: 138 mmol/L (ref 135–145)

## 2022-12-04 LAB — HEPATIC FUNCTION PANEL
ALT: 11 U/L (ref 0–44)
AST: 16 U/L (ref 15–41)
Albumin: 3.2 g/dL — ABNORMAL LOW (ref 3.5–5.0)
Alkaline Phosphatase: 291 U/L — ABNORMAL HIGH (ref 38–126)
Bilirubin, Direct: 0.1 mg/dL (ref 0.0–0.2)
Indirect Bilirubin: 0.5 mg/dL (ref 0.3–0.9)
Total Bilirubin: 0.6 mg/dL (ref 0.3–1.2)
Total Protein: 6 g/dL — ABNORMAL LOW (ref 6.5–8.1)

## 2022-12-04 MED ORDER — POTASSIUM CHLORIDE CRYS ER 20 MEQ PO TBCR: 20.0000 meq | EXTENDED_RELEASE_TABLET | Freq: Two times a day (BID) | ORAL | 0 refills | Status: DC

## 2022-12-04 MED ORDER — IOHEXOL 350 MG/ML SOLN
75.0000 mL | Freq: Once | INTRAVENOUS | Status: AC | PRN
  Administered 2022-12-04: 75 mL via INTRAVENOUS

## 2022-12-04 MED ORDER — OXYCODONE-ACETAMINOPHEN 5-325 MG PO TABS
1.0000 | ORAL_TABLET | Freq: Once | ORAL | Status: AC
  Administered 2022-12-04: 1 via ORAL
  Filled 2022-12-04: qty 1

## 2022-12-04 MED ORDER — ASPIRIN 81 MG PO CHEW
324.0000 mg | CHEWABLE_TABLET | Freq: Once | ORAL | Status: AC
  Administered 2022-12-04: 324 mg via ORAL
  Filled 2022-12-04: qty 4

## 2022-12-04 MED ORDER — OXYCODONE-ACETAMINOPHEN 5-325 MG PO TABS: 1.0000 | ORAL_TABLET | Freq: Four times a day (QID) | ORAL | 0 refills | Status: DC | PRN

## 2022-12-04 MED ORDER — POTASSIUM CHLORIDE 10 MEQ/100ML IV SOLN
10.0000 meq | INTRAVENOUS | Status: AC
  Administered 2022-12-04 (×2): 10 meq via INTRAVENOUS
  Filled 2022-12-04 (×2): qty 100

## 2022-12-04 MED ORDER — POTASSIUM CHLORIDE CRYS ER 20 MEQ PO TBCR
40.0000 meq | EXTENDED_RELEASE_TABLET | Freq: Once | ORAL | Status: AC
  Administered 2022-12-04: 40 meq via ORAL
  Filled 2022-12-04: qty 2

## 2022-12-04 MED ORDER — ONDANSETRON HCL 4 MG/2ML IJ SOLN
4.0000 mg | Freq: Once | INTRAMUSCULAR | Status: AC
  Administered 2022-12-04: 4 mg via INTRAVENOUS
  Filled 2022-12-04: qty 2

## 2022-12-04 NOTE — ED Triage Notes (Signed)
Pt reports she has been feeling her heart has been racing and her PCP did labs and told her to come to the ER for an elevated troponin that was drawn 2 days ago.  Pt reports doctor told her it may be a false positive but she needed to come to the ER and find out.

## 2022-12-04 NOTE — ED Provider Notes (Signed)
[place] EMERGENCY DEPARTMENT AT [facility] Provider Note   CSN: [id number] Arrival date & time: 12/04/22  1034     History  Chief Complaint  Patient presents with   Abnormal Lab   Chest Pain    Sharon Welch is a 52 y.o. female with history including seizure disorder, fibromyalgia, migraine headaches who was recently hospitalized secondary to a left hip fracture from a seizure induced fall, was discharged on May 28, presenting for evaluation of tachycardia and abnormal blood work.  She was seen by her primary provider 2 days ago where she states her heart rate was in the 140 range.  She states an EKG done that day did not show any significant findings, she was sent for blood test that day.  She was called by her PCP today stating her troponin level was elevated and she needed to come to the ED for further testing.  She denies chest pain, she is not aware of palpitations or arrhythmia.  She has noticed some shortness of breath with exertion, she denies orthopnea.  She has had no lower extremity edema, particularly unilateral, she does have some soreness over her suture site from her left hip surgery, she is still under the care of Dr. [name], first postop visit is scheduled for couple of days from now.   Chest Pain Associated symptoms: palpitations and shortness of breath   Associated symptoms: no abdominal pain, no cough, no dizziness, no fever, no headache, no nausea, no numbness, no vomiting and no weakness        Home Medications Prior to Admission medications   Medication Sig Start Date End Date Taking? Authorizing Provider  oxyCODONE-acetaminophen (PERCOCET/ROXICET) 5-325 MG tablet Take 1 tablet by mouth every 6 (six) hours as needed for severe pain. 12/04/22  Yes Rayven Rettig, Raynelle Fanning, PA-C  potassium chloride SA (KLOR-CON M) 20 MEQ tablet Take 1 tablet (20 mEq total) by mouth 2 (two) times daily. 12/04/22  Yes Jayin Derousse, Raynelle Fanning, PA-C  acetaminophen (TYLENOL) 500 MG tablet Take 1,000 mg  by mouth every 6 (six) hours as needed for moderate pain or headache.    [provider]  albuterol (VENTOLIN HFA) 108 (90 Base) MCG/ACT inhaler Inhale 1-2 puffs into the lungs every 4 (four) hours as needed for wheezing or shortness of breath.    [provider]  amitriptyline (ELAVIL) 25 MG tablet Take 50 mg by mouth at bedtime.    [provider]  aspirin EC 81 MG tablet Take 1 tablet (81 mg total) by mouth 2 (two) times daily. To prevent blood clots for 30 days after surgery. 11/19/22   Jenne Pane, PA-C  dexlansoprazole (DEXILANT) 60 MG capsule Take 60 mg by mouth daily.    [provider]  diclofenac Sodium (VOLTAREN) 1 % GEL Apply 2 g topically daily as needed (Pain). 02/12/22   [provider]  folic acid (FOLVITE) 1 MG tablet Take 1 tablet (1 mg total) by mouth daily. 11/19/22   Rodolph Bong, MD  hydrOXYzine (ATARAX) 25 MG tablet Take 25 mg by mouth 4 (four) times daily as needed for anxiety. 07/16/22   [provider]  lamoTRIgine (LAMICTAL) 100 MG tablet Take 1 tablet (100 mg total) by mouth 2 (two) times daily. Patient not taking: Reported on 11/16/2022 10/29/22   Levert Feinstein, MD  lamoTRIgine (LAMICTAL) 25 MG tablet 1 twice a day x1wk 2 twice a day x 2nd wk 3 twice a day x3rd wk Patient taking differently: Take  25-75 mg by mouth See admin instructions. 25 mg twice daily for 7 days, 50 mg twice daily for 7 days, 75 mg twice daily for 7 days. 10/29/22   Levert Feinstein, MD  linaclotide [facility]) 145 MCG CAPS capsule Take 145 mcg by mouth daily before breakfast.    [provider]  Milnacipran (SAVELLA) 50 MG TABS tablet Take 50 mg by mouth 2 (two) times daily.    [provider]  naloxone [facility]) nasal spray 4 mg/0.1 mL Place 1 spray into the nose as needed (opioid reversal). 01/16/22   [provider]  nicotine (NICODERM CQ - DOSED IN MG/24 HOURS) 14 mg/24hr patch Place 1 patch (14 mg total) onto the skin daily.  11/20/22   Rodolph Bong, MD  oxyCODONE (ROXICODONE) 5 MG immediate release tablet Take 1 tablet (5 mg total) by mouth every 4 (four) hours as needed for severe pain (after surgery that is not resolved by your normal daily dose of Vicodin). 11/19/22   Jenne Pane, PA-C  polyethylene glycol (MIRALAX / GLYCOLAX) 17 g packet Take 17 g by mouth daily as needed for mild constipation. 11/19/22   Rodolph Bong, MD  promethazine (PHENERGAN) 25 MG tablet Take 25 mg by mouth every 6 (six) hours as needed for nausea or vomiting.    [provider]  rizatriptan (MAXALT) 10 MG tablet Take 10 mg by mouth See admin instructions. 10 mg as needed for migraine, may repeat 10 mg in 2 hours if headache persists or recurs.    [provider]  tiZANidine (ZANAFLEX) 2 MG tablet Take 2 mg by mouth 2 (two) times daily.    [provider]      Allergies    Ultram [tramadol], Aleve [naproxen], Coconut (cocos nucifera), Depakote [divalproex sodium], Nsaids, and Pineapple    Review of Systems   Review of Systems  Constitutional:  Negative for chills and fever.  HENT:  Negative for congestion.   Eyes: Negative.   Respiratory:  Positive for shortness of breath. Negative for cough and chest tightness.   Cardiovascular:  Positive for palpitations. Negative for chest pain and leg swelling.  Gastrointestinal:  Negative for abdominal pain, nausea and vomiting.  Genitourinary: Negative.   Musculoskeletal:  Positive for arthralgias. Negative for joint swelling and neck pain.  Skin: Negative.  Negative for rash and wound.  Neurological:  Negative for dizziness, weakness, light-headedness, numbness and headaches.  Psychiatric/Behavioral: Negative.      Physical Exam Updated Vital Signs BP 137/73   Pulse 84   Temp 98.4 F (36.9 C) (Oral)   Resp 17   Ht 5' (1.524 m)   Wt 38.6 kg   SpO2 100%   BMI 16.60 kg/m  Physical Exam Vitals and nursing note reviewed.  Constitutional:       Appearance: She is well-developed.  HENT:     Head: Normocephalic and atraumatic.  Eyes:     Conjunctiva/sclera: Conjunctivae normal.  Cardiovascular:     Rate and Rhythm: Regular rhythm. Tachycardia present.     Heart sounds: Normal heart sounds.  Pulmonary:     Effort: Pulmonary effort is normal.     Breath sounds: Normal breath sounds. No wheezing or rhonchi.  Abdominal:     General: Bowel sounds are normal.     Palpations: Abdomen is soft.     Tenderness: There is no abdominal tenderness. There is no guarding or rebound. Negative signs include Murphy's sign.  Musculoskeletal:  General: Normal range of motion.     Cervical back: Normal range of motion.  Skin:    General: Skin is warm and dry.  Neurological:     Mental Status: She is alert.     ED Results / Procedures / Treatments   Labs (all labs ordered are listed, but only abnormal results are displayed) Labs Reviewed  BASIC METABOLIC PANEL - Abnormal; Notable for the following components:      Result Value   Potassium 2.8 (*)    CO2 21 (*)    Calcium 8.7 (*)    All other components within normal limits  HEPATIC FUNCTION PANEL - Abnormal; Notable for the following components:   Total Protein 6.0 (*)    Albumin 3.2 (*)    Alkaline Phosphatase 291 (*)    All other components within normal limits  CBC  TROPONIN I (HIGH SENSITIVITY)  TROPONIN I (HIGH SENSITIVITY)    EKG EKG Interpretation  Date/Time:  Wednesday December 04 2022 14:02:17 EDT Ventricular Rate:  96 PR Interval:  141 QRS Duration: 98 QT Interval:  373 QTC Calculation: 472 R Axis:   -84 Text Interpretation: Sinus rhythm LAD, consider left anterior fascicular block RSR' in V1 or V2, right VCD or RVH Borderline T abnormalities, anterior leads Confirmed by Vonita Moss [phone]) on 12/04/2022 4:34:06 PM  Radiology CT Angio Chest PE W and/or Wo Contrast  Result Date: 12/04/2022 CLINICAL DATA:  Pulmonary embolism (PE) suspected, high prob Chest  pain and tachycardia. EXAM: CT ANGIOGRAPHY CHEST WITH CONTRAST TECHNIQUE: Multidetector CT imaging of the chest was performed using the standard protocol during bolus administration of intravenous contrast. Multiplanar CT image reconstructions and MIPs were obtained to evaluate the vascular anatomy. RADIATION DOSE REDUCTION: This exam was performed according to the departmental dose-optimization program which includes automated exposure control, adjustment of the mA and/or kV according to patient size and/or use of iterative reconstruction technique. CONTRAST:  75mL OMNIPAQUE IOHEXOL 350 MG/ML SOLN COMPARISON:  Radiograph earlier today. FINDINGS: Cardiovascular: There are no filling defects within the pulmonary arteries to suggest pulmonary embolus. Normal thoracic aorta without dissection or aneurysm. The heart is normal in size. No pericardial effusion Mediastinum/Nodes: No mediastinal adenopathy or mass. No wall thickening of the esophagus. Lungs/Pleura: Moderate to advanced emphysema. Biapical pleuroparenchymal scarring. Left lung base scarring which is likely postoperative. No acute airspace disease, large pleural effusion, features of pulmonary edema or pulmonary mass. Mild central bronchial thickening. Upper Abdomen: Cholecystectomy. Common bile duct is partially included in the field of view, dilated at 16 mm where included. This has increased from MRCP 05/17/2019. There is mild intrahepatic biliary ductal dilatation. Musculoskeletal: Scoliosis with extensive spinal fusion hardware. Surgical changes medial left ribs. There are no acute or suspicious osseous abnormalities. No chest wall soft tissue abnormality. Review of the MIP images confirms the above findings. IMPRESSION: 1. No pulmonary embolus or acute intrathoracic abnormality. 2. Moderate emphysema.  Biapical and left basilar scarring. 3. Cholecystectomy. Common bile duct is partially included in the field of view, dilated at 16 mm where included.  This has increased from MRCP 05/17/2019. Recommend correlation with LFTs. Consider further evaluation with elective MRCP if LFTs are elevated. Emphysema (ICD10-J43.9). Electronically Signed   By: Narda Rutherford M.D.   On: 12/04/2022 15:16   DG Chest Portable 1 View  Result Date: 12/04/2022 CLINICAL DATA:  Chest tightness.  Heart racing. EXAM: PORTABLE CHEST 1 VIEW COMPARISON:  Chest radiographs 11/26/2022 FINDINGS: Cardiac silhouette and mediastinal contours are within normal limits.  The lungs are clear. No pleural effusion pneumothorax. Surgical clips again overlie the posteroinferior medial left chest, previously seen overlying the lower spine on lateral view. There is extensive thoracic and upper lumbar spine hardware including Harrington rods and rod and screw fusion. Lower cervical spine ACDF hardware is again seen. IMPRESSION: 1. No acute cardiopulmonary process. 2. Extensive thoracic and upper lumbar spine hardware. Electronically Signed   By: Neita Garnet M.D.   On: 12/04/2022 12:45    Procedures Procedures    Medications Ordered in ED Medications  oxyCODONE-acetaminophen (PERCOCET/ROXICET) 5-325 MG per tablet 1 tablet (1 tablet Oral Given 12/04/22 1331)  ondansetron (ZOFRAN) injection 4 mg (4 mg Intravenous Given 12/04/22 1350)  aspirin chewable tablet 324 mg (324 mg Oral Given 12/04/22 1330)  potassium chloride 10 mEq in 100 mL IVPB (0 mEq Intravenous Stopped 12/04/22 1700)  iohexol (OMNIPAQUE) 350 MG/ML injection 75 mL (75 mLs Intravenous Contrast Given 12/04/22 1448)  potassium chloride SA (KLOR-CON M) CR tablet 40 mEq (40 mEq Oral Given 12/04/22 1707)    ED Course/ Medical Decision Making/ A&P                             Medical Decision Making Patient presenting with tachycardia, had an elevated troponin with blood work completed 2 days ago by her PCP, I do not know how elevated her troponin was, however she has negative delta troponins today.  She also denies any chest pain but  has endorsed some mild shortness of breath with exertion in association with tachycardia.  Given she was recently discharged for hip fracture, surgery occurred on May 26, she is at risk for PE, this also could indicate pneumonia..  CT imaging was therefore performed, CT angio chest is negative for acute PE or any other cardiopulmonary process.  Does have moderate emphysema.  Amount and/or Complexity of Data Reviewed Labs: ordered.    Details: Significant labs, her be met reflects a hypokalemia with a potassium of 2.8.  Her delta troponins are negative at less than 2 which is reassuring.  Her CT angio chest suggest she has a dilated common bile duct, but unchanged from prior imaging.  LFTs were added and are negative, assuring common bile duct stone unlikely, of note patient is status postcholecystectomy.  Of note, she also has a significant elevation in her alk phos at 291.  However this is trending downward, suspect this is secondary to her recent orthopedic surgery. Radiology: ordered.    Details: Per above. ECG/medicine tests: ordered.    Details: Sinus rhythm, vent rate 96.  Risk Prescription drug management. Decision regarding hospitalization. Risk Details: No indication for admission at this time.           Final Clinical Impression(s) / ED Diagnoses Final diagnoses:  Palpitations  Hypokalemia    Rx / DC Orders ED Discharge Orders          Ordered    potassium chloride SA (KLOR-CON M) 20 MEQ tablet  2 times daily        12/04/22 1710    oxyCODONE-acetaminophen (PERCOCET/ROXICET) 5-325 MG tablet  Every 6 hours PRN        12/04/22 1711              Burgess Amor, PA-C 12/04/22 1714    Rondel Baton, MD 12/05/22 1153

## 2022-12-04 NOTE — ED Notes (Signed)
ED Provider at bedside. 

## 2022-12-04 NOTE — ED Notes (Signed)
Patient transported to CT 

## 2022-12-04 NOTE — Discharge Instructions (Signed)
Your lab test and imaging are reassuring today.  Plan to keep your appointment with the cardiologist next week.  In the interim you are prescribed some potassium tablets as your potassium level is low today.  I also prescribed you a small quantity of oxycodone to help you with your postoperative hip pain.  Do not drive within first of taking this medication as it will make you drowsy.

## 2022-12-05 ENCOUNTER — Ambulatory Visit: Payer: 59 | Attending: Internal Medicine

## 2022-12-05 ENCOUNTER — Telehealth: Payer: Self-pay | Admitting: Internal Medicine

## 2022-12-05 ENCOUNTER — Other Ambulatory Visit: Payer: Self-pay | Admitting: Internal Medicine

## 2022-12-05 ENCOUNTER — Encounter: Payer: Self-pay | Admitting: Internal Medicine

## 2022-12-05 ENCOUNTER — Ambulatory Visit: Payer: 59 | Attending: Internal Medicine | Admitting: Internal Medicine

## 2022-12-05 VITALS — BP 120/80 | HR 100 | Ht 60.0 in | Wt 85.0 lb

## 2022-12-05 DIAGNOSIS — R002 Palpitations: Secondary | ICD-10-CM

## 2022-12-05 DIAGNOSIS — R079 Chest pain, unspecified: Secondary | ICD-10-CM

## 2022-12-05 NOTE — Progress Notes (Signed)
Cardiology Office Note  Date: 12/05/2022   ID: Sharon Welch, DOB Nov 04, 1970, MRN [id number]  PCP:  Elfredia Nevins, MD  Cardiologist:  Marjo Bicker, MD Electrophysiologist:  None   Reason for Office Visit: Evaluation chest pain at the request of Dr. Carlena Sax   History of Present Illness: Sharon Welch is a 52 y.o. female known to have migraines was referred to cardiology clinic for evaluation of chest pains.  Patient has pain in her back and joints for which she is currently on pain medications. She started to have palpitations recently associated with intermittent chest pains. Palpitations last all day long. EKG showed sinus tachycardia.  Has some SOB but no worsening of DOE.  No syncope or leg swelling.  Smokes cigarettes.  Past Medical History:  Diagnosis Date   Bruising, spontaneous 12/08/2013   Cancer (HCC)    cervical cancer - had hysterectomy   Chronic back pain    Chronic back pain    DDD (degenerative disc disease), lumbar    DDD (degenerative disc disease), lumbar    Fibromyalgia    Headache(784.0)    migraines   Ovarian cyst    Partial tear subscapularis tendon 12/27/2011   Scoliosis    Seizures (HCC)    started 9/12-    Past Surgical History:  Procedure Laterality Date   ABDOMINAL HYSTERECTOMY  2012   BACK SURGERY  1989   scoliosis throsic-rods   CESAREAN SECTION     CHOLECYSTECTOMY N/A 11/17/2012   Procedure: LAPAROSCOPIC CHOLECYSTECTOMY WITH INTRAOPERATIVE CHOLANGIOGRAM;  Surgeon: Axel Filler, MD;  Location: [facility] OR;  Service: General;  Laterality: N/A;   COLONOSCOPY WITH PROPOFOL N/A 04/21/2020   Procedure: COLONOSCOPY WITH PROPOFOL;  Surgeon: Jeani Hawking, MD;  Location: [facility] ENDOSCOPY;  Service: Endoscopy;  Laterality: N/A;   ENDOSCOPIC RETROGRADE CHOLANGIOPANCREATOGRAPHY (ERCP) WITH PROPOFOL N/A 06/11/2019   Procedure: ENDOSCOPIC RETROGRADE CHOLANGIOPANCREATOGRAPHY (ERCP) WITH PROPOFOL;  Surgeon: Jeani Hawking, MD;  Location: [facility] ENDOSCOPY;   Service: Endoscopy;  Laterality: N/A;   ERCP N/A 05/07/2013   Procedure: ENDOSCOPIC RETROGRADE CHOLANGIOPANCREATOGRAPHY (ERCP);  Surgeon: Theda Belfast, MD;  Location: [facility];  Service: Endoscopy;  Laterality: N/A;  start ercp first, if canulation fails switch to EUS    ESOPHAGOGASTRODUODENOSCOPY (EGD) WITH PROPOFOL N/A 05/15/2019   Procedure: ESOPHAGOGASTRODUODENOSCOPY (EGD) WITH PROPOFOL;  Surgeon: West Bali, MD;  Location: [facility];  Service: Endoscopy;  Laterality: N/A;   ESOPHAGOGASTRODUODENOSCOPY (EGD) WITH PROPOFOL N/A 03/03/2020   Procedure: ESOPHAGOGASTRODUODENOSCOPY (EGD) WITH PROPOFOL;  Surgeon: Jeani Hawking, MD;  Location: [facility];  Service: Endoscopy;  Laterality: N/A;   ESOPHAGOGASTRODUODENOSCOPY (EGD) WITH PROPOFOL N/A 04/21/2020   Procedure: ESOPHAGOGASTRODUODENOSCOPY (EGD) WITH PROPOFOL;  Surgeon: Jeani Hawking, MD;  Location: [facility];  Service: Endoscopy;  Laterality: N/A;   EUS N/A 11/03/2012   Procedure: UPPER ENDOSCOPIC ULTRASOUND (EUS) LINEAR;  Surgeon: Theda Belfast, MD;  Location: [facility];  Service: Endoscopy;  Laterality: N/A;   left arm     NECK SURGERY     POLYPECTOMY  04/21/2020   Procedure: POLYPECTOMY;  Surgeon: Jeani Hawking, MD;  Location: [facility];  Service: Endoscopy;;   REMOVAL OF STONES  06/11/2019   Procedure: REMOVAL OF STONES;  Surgeon: Jeani Hawking, MD;  Location: [facility];  Service: Endoscopy;;   TOOTH EXTRACTION N/A 05/24/2022   Procedure: DENTAL RESTORATION/EXTRACTIONS;  Surgeon: Ocie Doyne, DMD;  Location: [facility] OR;  Service: Oral Surgery;  Laterality: N/A;   TOTAL HIP ARTHROPLASTY Left 11/17/2022   Procedure: TOTAL HIP  ARTHROPLASTY ANTERIOR APPROACH;  Surgeon: Sheral Apley, MD;  Location: [facility] OR;  Service: Orthopedics;  Laterality: Left;   TUBAL LIGATION     UPPER ESOPHAGEAL ENDOSCOPIC ULTRASOUND (EUS) N/A 06/11/2019   Procedure: UPPER ESOPHAGEAL ENDOSCOPIC ULTRASOUND (EUS);  Surgeon: Jeani Hawking, MD;   Location: [facility];  Service: Endoscopy;  Laterality: N/A;    Current Outpatient Medications  Medication Sig Dispense Refill   acetaminophen (TYLENOL) 500 MG tablet Take 1,000 mg by mouth every 6 (six) hours as needed for moderate pain or headache.     albuterol (VENTOLIN HFA) 108 (90 Base) MCG/ACT inhaler Inhale 1-2 puffs into the lungs every 4 (four) hours as needed for wheezing or shortness of breath.     ALPRAZolam (XANAX) 0.25 MG tablet Take 0.25 mg by mouth 4 (four) times daily as needed.     amitriptyline (ELAVIL) 25 MG tablet Take 50 mg by mouth at bedtime.     aspirin EC 81 MG tablet Take 1 tablet (81 mg total) by mouth 2 (two) times daily. To prevent blood clots for 30 days after surgery. 60 tablet 0   dexlansoprazole (DEXILANT) 60 MG capsule Take 60 mg by mouth daily.     diclofenac Sodium (VOLTAREN) 1 % GEL Apply 2 g topically daily as needed (Pain).     folic acid (FOLVITE) 1 MG tablet Take 1 tablet (1 mg total) by mouth daily.     lamoTRIgine (LAMICTAL) 100 MG tablet Take 1 tablet (100 mg total) by mouth 2 (two) times daily. 180 tablet 3   linaclotide (LINZESS) 145 MCG CAPS capsule Take 145 mcg by mouth daily before breakfast.     methocarbamol (ROBAXIN) 500 MG tablet Take 500 mg by mouth 3 (three) times daily.     Milnacipran (SAVELLA) 50 MG TABS tablet Take 50 mg by mouth 2 (two) times daily.     naloxone (NARCAN) nasal spray 4 mg/0.1 mL Place 1 spray into the nose as needed (opioid reversal).     nicotine (NICODERM CQ - DOSED IN MG/24 HOURS) 14 mg/24hr patch Place 1 patch (14 mg total) onto the skin daily. 28 patch 0   oxyCODONE (ROXICODONE) 5 MG immediate release tablet Take 1 tablet (5 mg total) by mouth every 4 (four) hours as needed for severe pain (after surgery that is not resolved by your normal daily dose of Vicodin). 35 tablet 0   oxyCODONE-acetaminophen (PERCOCET/ROXICET) 5-325 MG tablet Take 1 tablet by mouth every 6 (six) hours as needed for severe pain. 10 tablet  0   polyethylene glycol (MIRALAX / GLYCOLAX) 17 g packet Take 17 g by mouth daily as needed for mild constipation. 14 each 0   potassium chloride SA (KLOR-CON M) 20 MEQ tablet Take 1 tablet (20 mEq total) by mouth 2 (two) times daily. 14 tablet 0   promethazine (PHENERGAN) 25 MG tablet Take 25 mg by mouth every 6 (six) hours as needed for nausea or vomiting.     rizatriptan (MAXALT) 10 MG tablet Take 10 mg by mouth See admin instructions. 10 mg as needed for migraine, may repeat 10 mg in 2 hours if headache persists or recurs.     No current facility-administered medications for this visit.   Allergies:  Ultram [tramadol], Aleve [naproxen], Coconut (cocos nucifera), Depakote [divalproex sodium], Nsaids, and Pineapple   Social History: The patient  reports that she has been smoking cigarettes. She has a 10.00 pack-year smoking history. She has never used smokeless tobacco. She reports that she does  not currently use alcohol. She reports that she does not use drugs.   Family History: The patient's family history includes Cancer in her father; Heart disease in her father; Hypertension in her brother, brother, father, and mother.   ROS:  Please see the history of present illness. Otherwise, complete review of systems is positive for none  All other systems are reviewed and negative.   Physical Exam: VS:  BP 120/80   Pulse 100   Ht 5' (1.524 m)   Wt 85 lb (38.6 kg)   SpO2 100%   BMI 16.60 kg/m , BMI Body mass index is 16.6 kg/m.  Wt Readings from Last 3 Encounters:  12/05/22 85 lb (38.6 kg)  12/04/22 85 lb (38.6 kg)  11/15/22 99 lb (44.9 kg)    General: Patient appears comfortable at rest. HEENT: Conjunctiva and lids normal, oropharynx clear with moist mucosa. Neck: Supple, no elevated JVP or carotid bruits, no thyromegaly. Lungs: Clear to auscultation, nonlabored breathing at rest. Cardiac: Regular rate and rhythm, no S3 or significant systolic murmur, no pericardial rub. Abdomen:  Soft, nontender, no hepatomegaly, bowel sounds present, no guarding or rebound. Extremities: No pitting edema, distal pulses 2+. Skin: Warm and dry. Musculoskeletal: No kyphosis. Neuropsychiatric: Alert and oriented x3, affect grossly appropriate.  Recent Labwork: 10/29/2022: TSH 2.050 11/19/2022: Magnesium 2.2 12/04/2022: ALT 11; AST 16; BUN 17; Creatinine, Ser 0.82; Hemoglobin 13.4; Platelets 340; Potassium 2.8; Sodium 138  No results found for: "CHOL", "TRIG", "HDL", "CHOLHDL", "VLDL", "LDLCALC", "LDLDIRECT"   Assessment and Plan: Patient is a 52 year old F known to have migraines was referred to cardiology clinic for evaluation of chest pains.  # Chest pain -Chest pains occur in association with palpitations. No relation with rest or exercise. It last for few minutes to hours. Obtain Lexiscan.  # Palpitations -EKG shows sinus tachycardia.  Treat underlying pain.  Start metoprolol tartrate 25 mg twice daily for symptomatic relief.  Obtain 1 week event monitor.  # Nicotine abuse -Currently smokes almost half pack per day.  Does not want to quit at this time.  Smoking cessation counseling provided. Smoking cessation instruction/counseling given:  counseled patient on the dangers of tobacco use, advised patient to stop smoking, and reviewed strategies to maximize success   I have spent a total of 45 minutes with patient reviewing chart, EKGs, labs and examining patient as well as establishing an assessment and plan that was discussed with the patient.  > 50% of time was spent in direct patient care.    Medication Adjustments/Labs and Tests Ordered: Current medicines are reviewed at length with the patient today.  Concerns regarding medicines are outlined above.   Tests Ordered: Orders Placed This Encounter  Procedures   NM Myocar Multi W/Spect W/Wall Motion / EF   EKG 12-Lead    Medication Changes: No orders of the defined types were placed in this encounter.   Disposition:   Follow up  6 months  Signed Letasha Kershaw Verne Spurr, MD, 12/05/2022 5:41 PM    [facility] at [facility] [address] [place], [place], [place] [address]

## 2022-12-05 NOTE — Telephone Encounter (Signed)
Checking percert on the following patient for testing scheduled at [facility].   LEXISCAN 12/16/2022  LONG TERM MONITOR

## 2022-12-05 NOTE — Patient Instructions (Addendum)
Medication Instructions:  Your physician has recommended you make the following change in your medication:  Metoprolol tartrate 25 mg twice a day Continue all other medications the same  Labwork: None  Testing/Procedures: Your physician has requested that you have a lexiscan myoview. For further information please visit [email or web address] Please follow instruction sheet, as given.  Your physician has recommended that you wear a Zio monitor.   This monitor is a medical device that records the heart's electrical activity. Doctors most often use these monitors to diagnose arrhythmias. Arrhythmias are problems with the speed or rhythm of the heartbeat. The monitor is a small device applied to your chest. You can wear one while you do your normal daily activities. While wearing this monitor if you have any symptoms to push the button and record what you felt. Once you have worn this monitor for the period of time provider prescribed (for 7 days), you will return the monitor device in the postage paid box. Once it is returned they will download the data collected and provide [place] with a report which the provider will then review and we will call you with those results. Important tips:  Avoid showering during the first 48 hours of wearing the monitor. Avoid excessive sweating to help maximize wear time. Do not submerge the device, no hot tubs, and no swimming pools. Keep any lotions or oils away from the patch. After 48 hours you may shower with the patch on. Take brief showers with your back facing the shower head.  Do not remove patch once it has been placed because that will interrupt data and decrease adhesive wear time. Push the button when you have any symptoms and write down what you were feeling. Once you have completed wearing your monitor, remove and place into box which has postage paid and place in your outgoing mailbox.  If for some reason you have misplaced your box then call our office  and we can provide another box and/or mail it off for you.   Follow-Up: Your physician recommends that you schedule a follow-up appointment in: 6 month follow up  Any Other Special Instructions Will Be Listed Below (If Applicable).  If you need a refill on your cardiac medications before your next appointment, please call your pharmacy. Me

## 2022-12-06 ENCOUNTER — Encounter: Payer: Self-pay | Admitting: Neurology

## 2022-12-06 ENCOUNTER — Telehealth: Payer: Self-pay | Admitting: Internal Medicine

## 2022-12-06 DIAGNOSIS — G40909 Epilepsy, unspecified, not intractable, without status epilepticus: Secondary | ICD-10-CM

## 2022-12-06 DIAGNOSIS — Q283 Other malformations of cerebral vessels: Secondary | ICD-10-CM

## 2022-12-06 DIAGNOSIS — G43709 Chronic migraine without aura, not intractable, without status migrainosus: Secondary | ICD-10-CM

## 2022-12-06 MED ORDER — METOPROLOL TARTRATE 25 MG PO TABS: 25.0000 mg | ORAL_TABLET | Freq: Two times a day (BID) | ORAL | 3 refills | Status: DC

## 2022-12-06 NOTE — Telephone Encounter (Signed)
Called patient to let her know that Metoprolol tartrate has been sent in to her phamarcy.

## 2022-12-06 NOTE — Telephone Encounter (Signed)
Prescription wasn't sent in at visit.

## 2022-12-06 NOTE — Procedures (Signed)
    Clinical History : This is a 52 y/o F who presents for evaluation of seizure. She also has a history of chronic migraine, GERD, insomnia, constipation, fibromyalgia, and cervical cancer.  INTERMITTENT MONITORING with VIDEO TECHNICAL SUMMARY: Normal awake study. No epileptiform discharges or any other paroxysmal activities or focal abnormalities seen. Clinical correlation is recommended.  PATIENT EVENTS: A button press was not made and no notations were included.  TECHNOLOGIST EVENTS: No clear epileptiform activity was detected by the reviewing neurodiagnostic technologist during the recording for further evaluation.  TIME SAMPLES: 10-minutes of every two hours recorded are reviewed as random time samples.  SLEEP SAMPLES: 5-minutes of every 24 hour recorded sleep cycle are reviewed as random sleep samples.  AWAKE: At maximal level of alertness, the posterior dominant background activity was continuous, reactive, low voltage rhythm of 8.5-9 Hz. This was symmetric, well-modulated, and attenuated with eye opening. Diffuse, symmetric, frontocentral beta range activity was present.  SLEEP: N1 Sleep (Stage 1) was observed and characterized by the disappearance of alpha rhythm and the appearance of vertex activity. N2 Sleep (Stage 2) was observed and characterized by vertex waves, K-complexes, and sleep spindles. N3 (Stage 3) sleep was observed and characterized by high amplitude Delta activity of 20%.  EKG: There were no arrhythmias or abnormalities noted during this recording.   Impression: This is a normal 72 hours ambulatory EEG recording. There were no seizures, epileptiform discharges and no events seen during this recording.    Windell Norfolk, MD [facility]

## 2022-12-06 NOTE — Telephone Encounter (Signed)
Pt c/o medication issue:  1. Name of Medication: Metoprolol Tartrate 25 MG  2. How are you currently taking this medication (dosage and times per day)? BID  3. Are you having a reaction (difficulty breathing--STAT)? No  4. What is your medication issue? Pt states that provider was supposed to call medication into pharmacy on file per visit yesterday. Pt states that once sent to pharmacy she would like a 90 day supply. Please advise

## 2022-12-11 NOTE — Therapy (Incomplete)
OUTPATIENT PHYSICAL THERAPY LOWER EXTREMITY EVALUATION   [name] MRN: [id number] DOB:1970-12-18, 52 y.o., female Today's Date: 12/12/2022  END OF SESSION:  PT End of Session - 12/12/22 1122     Visit Number 1    Number of Visits 12    Date for PT Re-Evaluation 01/23/23    Authorization Type [employer]    PT Start Time 1120    PT Stop Time 1200    PT Time Calculation (min) 40 min    Activity Tolerance Patient limited by pain    Behavior During Therapy Anxious             Past Medical History:  Diagnosis Date   Bruising, spontaneous 12/08/2013   Cancer (HCC)    cervical cancer - had hysterectomy   Chronic back pain    Chronic back pain    DDD (degenerative disc disease), lumbar    DDD (degenerative disc disease), lumbar    Fibromyalgia    Headache(784.0)    migraines   Ovarian cyst    Partial tear subscapularis tendon 12/27/2011   Scoliosis    Seizures (HCC)    started 9/12-   Past Surgical History:  Procedure Laterality Date   ABDOMINAL HYSTERECTOMY  2012   BACK SURGERY  1989   scoliosis throsic-rods   CESAREAN SECTION     CHOLECYSTECTOMY N/A 11/17/2012   Procedure: LAPAROSCOPIC CHOLECYSTECTOMY WITH INTRAOPERATIVE CHOLANGIOGRAM;  Surgeon: Axel Filler, MD;  Location: [facility];  Service: General;  Laterality: N/A;   COLONOSCOPY WITH PROPOFOL N/A 04/21/2020   Procedure: COLONOSCOPY WITH PROPOFOL;  Surgeon: Jeani Hawking, MD;  Location: [facility];  Service: Endoscopy;  Laterality: N/A;   ENDOSCOPIC RETROGRADE CHOLANGIOPANCREATOGRAPHY (ERCP) WITH PROPOFOL N/A 06/11/2019   Procedure: ENDOSCOPIC RETROGRADE CHOLANGIOPANCREATOGRAPHY (ERCP) WITH PROPOFOL;  Surgeon: Jeani Hawking, MD;  Location: [facility];  Service: Endoscopy;  Laterality: N/A;   ERCP N/A 05/07/2013   Procedure: ENDOSCOPIC RETROGRADE CHOLANGIOPANCREATOGRAPHY (ERCP);  Surgeon: Theda Belfast, MD;  Location: [facility];  Service: Endoscopy;  Laterality: N/A;  start ercp first, if  canulation fails switch to EUS    ESOPHAGOGASTRODUODENOSCOPY (EGD) WITH PROPOFOL N/A 05/15/2019   Procedure: ESOPHAGOGASTRODUODENOSCOPY (EGD) WITH PROPOFOL;  Surgeon: West Bali, MD;  [facility]: [facility];  Service: Endoscopy;  Laterality: N/A;   ESOPHAGOGASTRODUODENOSCOPY (EGD) WITH PROPOFOL N/A 03/03/2020   Procedure: ESOPHAGOGASTRODUODENOSCOPY (EGD) WITH PROPOFOL;  Surgeon: Jeani Hawking, MD;  Location: [facility];  Service: Endoscopy;  Laterality: N/A;   ESOPHAGOGASTRODUODENOSCOPY (EGD) WITH PROPOFOL N/A 04/21/2020   Procedure: ESOPHAGOGASTRODUODENOSCOPY (EGD) WITH PROPOFOL;  Surgeon: Jeani Hawking, MD;  Location: [facility];  Service: Endoscopy;  Laterality: N/A;   EUS N/A 11/03/2012   Procedure: UPPER ENDOSCOPIC ULTRASOUND (EUS) LINEAR;  Surgeon: Theda Belfast, MD;  Location: [facility];  Service: Endoscopy;  Laterality: N/A;   left arm     NECK SURGERY     POLYPECTOMY  04/21/2020   Procedure: POLYPECTOMY;  Surgeon: Jeani Hawking, MD;  Location: [facility];  Service: Endoscopy;;   REMOVAL OF STONES  06/11/2019   Procedure: REMOVAL OF STONES;  Surgeon: Jeani Hawking, MD;  Location: [facility];  Service: Endoscopy;;   TOOTH EXTRACTION N/A 05/24/2022   Procedure: DENTAL RESTORATION/EXTRACTIONS;  Surgeon: Ocie Doyne, DMD;  Location: [facility];  Service: Oral Surgery;  Laterality: N/A;   TOTAL HIP ARTHROPLASTY Left 11/17/2022   Procedure: TOTAL HIP ARTHROPLASTY ANTERIOR APPROACH;  Surgeon: Sheral Apley, MD;  Location: [facility];  Service: Orthopedics;  Laterality: Left;  TUBAL LIGATION     UPPER ESOPHAGEAL ENDOSCOPIC ULTRASOUND (EUS) N/A 06/11/2019   Procedure: UPPER ESOPHAGEAL ENDOSCOPIC ULTRASOUND (EUS);  Surgeon: Jeani Hawking, MD;  Location: [facility];  Service: Endoscopy;  Laterality: N/A;   Patient Active Problem List   Diagnosis Date Noted   Palpitations 12/05/2022   Transaminitis 11/16/2022   Hip fracture (HCC) 11/15/2022   GERD (gastroesophageal reflux  disease) 11/15/2022   Tobacco use 11/15/2022   Idiopathic hypotension 07/19/2022   Slurred speech 07/19/2022   Underweight 07/19/2022   Hypotension 07/19/2022   Seizure disorder (HCC) 07/18/2022   Chronic migraine w/o aura w/o status migrainosus, not intractable 07/18/2022   Cerebral cavernoma 07/18/2022   Acute GI bleed due to bleeding pyloric/gastric ulcer 05/15/2019   PUD (peptic ulcer disease)-EGD 05/15/2019 with bleeding pyloric/gastric ulcer, nonbleeding duodenal ulcers and gastritis    Hematemesis with nausea    Migraine headache 09/25/2015   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   Bilateral occipital neuralgia 01/02/2015   Migraine 01/02/2015   Chest pain of uncertain etiology 11/29/2014   DDD (degenerative disc disease), cervical 10/27/2014   DDD (degenerative disc disease), 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   DDD (degenerative disc disease), lumbosacral 10/27/2014   Hypokalemia 05/07/2013   Partial tear subscapularis tendon 12/27/2011    PCP: Elfredia Nevins  REFERRING PROVIDER: Sheral Apley, MD  REFERRING DIAG: [phone] (ICD-10-CM) - Status post left hip replacement  THERAPY DIAG:  Pain in left hip - Plan: PT plan of care cert/re-cert  Difficulty in walking, not elsewhere classified - Plan: PT plan of care cert/re-cert  Rationale for Evaluation and Treatment: Rehabilitation  ONSET DATE: 11/17/22  SUBJECTIVE:   SUBJECTIVE STATEMENT: 11/12/22 left hip fracture (did not go to hospital until 11/15/22)  so then 11/17/22 had THA per Dr. [name].  3 days in hospital after surgery; anterior approach.   Prior to surgery did not use AD. Arrives with daughter in [facility];  states she fell this morning just prior to PT appt. ; also states she fell multiple times yesterday.  before surgery had multiple falls.  using a RW since surgery.   Patient is very weepy; anxious from fall this morning.  Bent to pick up a pill on the floor and fell in hallway today.  Dentures are loose and speech  is slurred; daughter states she is slightly more slurred than at baseline.    PERTINENT HISTORY: Chronic pain; takes multiple medications Chronic migraine Seizure fibromyalgia PAIN:  Are you having pain? Yes: NPRS scale: 8/10 Pain location: hip and back Pain description: aches, sore Aggravating factors: fell this morning Relieving factors: heat, meds  PRECAUTIONS: Fall  WEIGHT BEARING RESTRICTIONS: No  FALLS:  Has patient fallen in last 6 months? Yes. Number of falls 10 plus  LIVING ENVIRONMENT: Lives with: lives with their family Lives in: House/apartment Stairs: Yes: External: 5 steps; on left going up Has following equipment at home: Dan Humphreys - 2 wheeled and Wheelchair (manual)  OCCUPATION: retired  PLOF: Independent with household mobility with device  PATIENT GOALS: get rid of the walker  NEXT MD VISIT: 01/01/23  OBJECTIVE:   Steri strips remain left anterior hip; sits in wc with left knee valgus; left hip adduction  DIAGNOSTIC FINDINGS:  CLINICAL DATA:  Elective surgery. Intraoperative fluoroscopy for total left hip arthroplasty.   EXAM: DG HIP (WITH OR WITHOUT PELVIS) 1V*L*   COMPARISON:  Pelvis and left hip radiographs 11/15/2022   FINDINGS: Images were performed intraoperatively without the presence of a radiologist. New total left  hip arthroplasty. No hardware complication is seen.   Total fluoroscopy images: 2   Total fluoroscopy time: 21 seconds   Total dose: Radiation Exposure Index (as provided by the fluoroscopic device): 2.01 mGy air Kerma   Please see intraoperative findings for further detail.   IMPRESSION: Intraoperative fluoroscopy for total left hip arthroplasty.   PATIENT SURVEYS:  FOTO 0.54  COGNITION: Overall cognitive status: Impaired Per daughter's report    SENSATION: Not tested  EDEMA:  No significant swelling noted  POSTURE: rounded shoulders, forward head, flexed trunk , weight shift right, and flexed  knees  PALPATION: General soreness left hip but no severe pain with touch  LOWER EXTREMITY ROM:  Active ROM Right eval Left eval  Hip flexion    Hip extension    Hip abduction    Hip adduction    Hip internal rotation    Hip external rotation    Knee flexion    Knee extension    Ankle dorsiflexion    Ankle plantarflexion    Ankle inversion    Ankle eversion     (Blank rows = not tested)  LOWER EXTREMITY MMT:  MMT Right eval Left eval  Hip flexion  3-  Hip extension    Hip abduction    Hip adduction    Hip internal rotation    Hip external rotation    Knee flexion    Knee extension  2+  Ankle dorsiflexion  4  Ankle plantarflexion    Ankle inversion    Ankle eversion     (Blank rows = not tested)   FUNCTIONAL TESTS:  30 seconds chair stand test x 1 to RW (CGA for safety)  GAIT: Distance walked: 0 Assistive device utilized: Wheelchair (manual) left walker in the car Level of assistance: CGA Comments: unable to fully extend knees   TODAY'S TREATMENT:                                                                                                                              DATE: 12/11/2022 physical therapy evaluation and HEP    PATIENT EDUCATION:  Education details: Patient educated on exam findings, POC, scope of PT, HEP, and further testing next visit; PT recommendation to go to ED per history of frequent falls; notified MD office of falls. Person educated: Patient and daughter Education method: Explanation, Demonstration, and Handouts Education comprehension: verbalized understanding, returned demonstration, verbal cues required, and tactile cues required  HOME EXERCISE PROGRAM: Home health HEP  ASSESSMENT:  CLINICAL IMPRESSION: Patient is a 52 y.o. female who was seen today for physical therapy evaluation and treatment for s/p left THA anterior approach per Dr. [name]. Patient with a fall just prior to PT appt today and had to have 2 people assist  her up out of the floor. Also states she had multiple falls yesterday.   Patient declines ED visit.  PT called Dr. Greig Right office to notify of falls and left a  message with Jewel Baize.  Question possible polypharmacy issue? Patient is quite petite and spoke with patient about the effects of strong medication on a person with small body mass.  We only assessed sit to stand transfer today and will assess further next visit.  Encouraged patient to continue with HEP from home health and to go to ED if she has any more falls.  Also encouraged patient to use walker as she starts crying saying she wants to wean off cane.  PT explained this is not safe for her currently with her fall history.   Patient demonstrates muscle weakness, reduced ROM, and fascial restrictions which are likely contributing to symptoms of pain and are negatively impacting patient ability to perform ADLs and functional mobility tasks. Patient will benefit from skilled physical therapy services to address these deficits to reduce pain and improve level of function with ADLs and functional mobility tasks.   OBJECTIVE IMPAIRMENTS: Abnormal gait, decreased activity tolerance, decreased balance, decreased coordination, decreased endurance, decreased knowledge of use of DME, decreased mobility, difficulty walking, decreased ROM, decreased strength, decreased safety awareness, hypomobility, increased fascial restrictions, impaired perceived functional ability, impaired flexibility, and pain.   ACTIVITY LIMITATIONS: carrying, lifting, bending, sitting, standing, squatting, sleeping, stairs, transfers, bed mobility, bathing, toileting, dressing, hygiene/grooming, and locomotion level  PARTICIPATION LIMITATIONS: meal prep, cleaning, laundry, driving, shopping, community activity, and yard work  [facility] POTENTIAL: Fair due to frequent fall history  CLINICAL DECISION MAKING: Evolving/moderate complexity  EVALUATION COMPLEXITY: Moderate   GOALS: Goals  reviewed with patient? No  SHORT TERM GOALS: Target date: 01/09/2023 patient will be independent with initial HEP Baseline: Goal status: INITIAL  2.  Patient will self report 30% improvement to improve tolerance for functional activity  Baseline:  Goal status: INITIAL    LONG TERM GOALS: Target date: 01/23/2023  Patient will be independent in self management strategies to improve quality of life and functional outcomes.   Baseline:  Goal status: INITIAL  2.  Patient will self report 50% improvement to improve tolerance for functional activity  Baseline:  Goal status: INITIAL  3.  Patient will improve FOTO score to 40  Baseline: 0.53 Goal status: INITIAL  4.  Patient will remain free of falls Baseline:  Goal status: INITIAL  5.  Patient will ambulate with LRAD x 224 ft in 2 min or less to demonstrate improved functional mobility Baseline:  Goal status: INITIAL  6.  Patient will increase left leg MMTs to 4+ to 5/5 without pain to promote return to ambulation community distances with minimal deviation.  Baseline: see above Goal status: INITIAL   PLAN:  PT FREQUENCY: 2x/week  PT DURATION: 4 weeks  PLANNED INTERVENTIONS: Therapeutic exercises, Therapeutic activity, Neuromuscular re-education, Balance training, Gait training, Patient/Family education, Joint manipulation, Joint mobilization, Stair training, Orthotic/Fit training, DME instructions, Aquatic Therapy, Dry Needling, Electrical stimulation, Spinal manipulation, Spinal mobilization, Cryotherapy, Moist heat, Compression bandaging, scar mobilization, Splintting, Taping, Traction, Ultrasound, Ionotophoresis 4mg /ml Dexamethasone, and Manual therapy   PLAN FOR NEXT SESSION: initiate HEP and review goals; try ambulation with RW; AROM left hip and knee; gait training; balance and strengthening as able; high fall risk   1:09 PM, 12/12/22 Jaqualin Serpa Small Oneida Mckamey MPT [place] [facility] [place]  [phone] [phone]

## 2022-12-12 ENCOUNTER — Other Ambulatory Visit: Payer: Self-pay

## 2022-12-12 ENCOUNTER — Ambulatory Visit (HOSPITAL_COMMUNITY): Payer: 59 | Attending: Orthopedic Surgery

## 2022-12-12 DIAGNOSIS — M25552 Pain in left hip: Secondary | ICD-10-CM | POA: Diagnosis present

## 2022-12-12 DIAGNOSIS — R262 Difficulty in walking, not elsewhere classified: Secondary | ICD-10-CM | POA: Insufficient documentation

## 2022-12-15 ENCOUNTER — Emergency Department (HOSPITAL_COMMUNITY): Payer: 59

## 2022-12-15 ENCOUNTER — Other Ambulatory Visit: Payer: Self-pay

## 2022-12-15 ENCOUNTER — Emergency Department (HOSPITAL_COMMUNITY)
Admission: EM | Admit: 2022-12-15 | Discharge: 2022-12-15 | Disposition: A | Discharge status: Home / Self Care | Payer: 59 | Attending: Emergency Medicine | Admitting: Emergency Medicine

## 2022-12-15 ENCOUNTER — Encounter (HOSPITAL_COMMUNITY): Payer: Self-pay | Admitting: Emergency Medicine

## 2022-12-15 DIAGNOSIS — Z79899 Other long term (current) drug therapy: Secondary | ICD-10-CM | POA: Diagnosis not present

## 2022-12-15 DIAGNOSIS — Z7982 Long term (current) use of aspirin: Secondary | ICD-10-CM | POA: Diagnosis not present

## 2022-12-15 DIAGNOSIS — I10 Essential (primary) hypertension: Secondary | ICD-10-CM | POA: Diagnosis not present

## 2022-12-15 DIAGNOSIS — R112 Nausea with vomiting, unspecified: Secondary | ICD-10-CM | POA: Diagnosis not present

## 2022-12-15 DIAGNOSIS — Z9889 Other specified postprocedural states: Secondary | ICD-10-CM

## 2022-12-15 DIAGNOSIS — R531 Weakness: Secondary | ICD-10-CM

## 2022-12-15 LAB — COMPREHENSIVE METABOLIC PANEL
ALT: 11 U/L (ref 0–44)
AST: 13 U/L — ABNORMAL LOW (ref 15–41)
Albumin: 3.1 g/dL — ABNORMAL LOW (ref 3.5–5.0)
Alkaline Phosphatase: 335 U/L — ABNORMAL HIGH (ref 38–126)
Anion gap: 9 (ref 5–15)
BUN: 25 mg/dL — ABNORMAL HIGH (ref 6–20)
CO2: 22 mmol/L (ref 22–32)
Calcium: 8.3 mg/dL — ABNORMAL LOW (ref 8.9–10.3)
Chloride: 105 mmol/L (ref 98–111)
Creatinine, Ser: 0.89 mg/dL (ref 0.44–1.00)
GFR, Estimated: >60 mL/min (ref >60)
Glucose, Bld: 152 mg/dL — ABNORMAL HIGH (ref 70–99)
Potassium: 3.2 mmol/L — ABNORMAL LOW (ref 3.5–5.1)
Sodium: 136 mmol/L (ref 135–145)
Total Bilirubin: 0.6 mg/dL (ref 0.3–1.2)
Total Protein: 6.4 g/dL — ABNORMAL LOW (ref 6.5–8.1)

## 2022-12-15 LAB — CBC
HCT: 36.7 % (ref 36.0–46.0)
Hemoglobin: 11.9 g/dL — ABNORMAL LOW (ref 12.0–15.0)
MCH: 31.8 pg (ref 26.0–34.0)
MCHC: 32.4 g/dL (ref 30.0–36.0)
MCV: 98.1 fL (ref 80.0–100.0)
Platelets: 264 10*3/uL (ref 150–400)
RBC: 3.74 MIL/uL — ABNORMAL LOW (ref 3.87–5.11)
RDW: 13.7 % (ref 11.5–15.5)
WBC: 8 10*3/uL (ref 4.0–10.5)
nRBC: 0 % (ref 0.0–0.2)

## 2022-12-15 LAB — URINALYSIS, ROUTINE W REFLEX MICROSCOPIC
Bacteria, UA: NONE SEEN
Bilirubin Urine: NEGATIVE
Glucose, UA: NEGATIVE mg/dL
Ketones, ur: NEGATIVE mg/dL
Leukocytes,Ua: NEGATIVE
Nitrite: NEGATIVE
Protein, ur: NEGATIVE mg/dL
Specific Gravity, Urine: 1.016 (ref 1.005–1.030)
pH: 5 (ref 5.0–8.0)

## 2022-12-15 LAB — CBG MONITORING, ED: Glucose-Capillary: 167 mg/dL — ABNORMAL HIGH (ref 70–99)

## 2022-12-15 MED ORDER — SODIUM CHLORIDE 0.9 % IV BOLUS
1000.0000 mL | Freq: Once | INTRAVENOUS | Status: AC
  Administered 2022-12-15: 1000 mL via INTRAVENOUS

## 2022-12-15 MED ORDER — ONDANSETRON HCL 4 MG/2ML IJ SOLN
4.0000 mg | Freq: Once | INTRAMUSCULAR | Status: AC
  Administered 2022-12-15: 4 mg via INTRAVENOUS
  Filled 2022-12-15: qty 2

## 2022-12-15 NOTE — ED Provider Notes (Signed)
[place] EMERGENCY DEPARTMENT AT [facility] Provider Note   CSN: [id number] Arrival date & time: 12/15/22  1310     History  Chief Complaint  Patient presents with   Weakness    Sharon Welch is a 52 y.o. female.  HPI Patient presents with her son who provides the history.  Patient is listless, but awake. History is notable for hip replacement 3 weeks ago.  Essentially since that time the patient has had substantial decline in condition, with multiple episodes of vomiting, retching, decreased interactivity.  She was sent home on muscle relaxants, sedatives and pain medication according to the patient's son. It is unclear if she has been doing all of her medication, or taking it with consistency. The patient herself answers questions minimally, briefly, without substantial detail.  Son notes that the patient's had declined since the surgery.    Home Medications Prior to Admission medications   Medication Sig Start Date End Date Taking? Authorizing Provider  acetaminophen (TYLENOL) 500 MG tablet Take 1,000 mg by mouth every 6 (six) hours as needed for moderate pain or headache.    [provider]  albuterol (VENTOLIN HFA) 108 (90 Base) MCG/ACT inhaler Inhale 1-2 puffs into the lungs every 4 (four) hours as needed for wheezing or shortness of breath.    [provider]  amitriptyline (ELAVIL) 25 MG tablet Take 50 mg by mouth at bedtime.    [provider]  aspirin EC 81 MG tablet Take 1 tablet (81 mg total) by mouth 2 (two) times daily. To prevent blood clots for 30 days after surgery. 11/19/22   Jenne Pane, PA-C  dexlansoprazole (DEXILANT) 60 MG capsule Take 60 mg by mouth daily.    [provider]  diclofenac Sodium (VOLTAREN) 1 % GEL Apply 2 g topically daily as needed (Pain). 02/12/22   [provider]  folic acid (FOLVITE) 1 MG tablet Take 1 tablet (1 mg total) by mouth daily. 11/19/22   Rodolph Bong, MD  lamoTRIgine  (LAMICTAL) 100 MG tablet Take 1 tablet (100 mg total) by mouth 2 (two) times daily. 10/29/22   Levert Feinstein, MD  linaclotide [facility]) 145 MCG CAPS capsule Take 145 mcg by mouth daily before breakfast.    [provider]  metoprolol tartrate (LOPRESSOR) 25 MG tablet Take 1 tablet (25 mg total) by mouth 2 (two) times daily. 12/06/22 03/06/23  Mallipeddi, Vishnu P, MD  Milnacipran (SAVELLA) 50 MG TABS tablet Take 50 mg by mouth 2 (two) times daily.    [provider]  naloxone [facility]) nasal spray 4 mg/0.1 mL Place 1 spray into the nose as needed (opioid reversal). 01/16/22   [provider]  nicotine (NICODERM CQ - DOSED IN MG/24 HOURS) 14 mg/24hr patch Place 1 patch (14 mg total) onto the skin daily. 11/20/22   Rodolph Bong, MD  oxyCODONE (ROXICODONE) 5 MG immediate release tablet Take 1 tablet (5 mg total) by mouth every 4 (four) hours as needed for severe pain (after surgery that is not resolved by your normal daily dose of Vicodin). 11/19/22   Jenne Pane, PA-C  polyethylene glycol (MIRALAX / GLYCOLAX) 17 g packet Take 17 g by mouth daily as needed for mild constipation. 11/19/22   Rodolph Bong, MD  potassium chloride SA (KLOR-CON M) 20 MEQ tablet Take 1 tablet (20 mEq total) by mouth 2 (two) times daily. 12/04/22   Burgess Amor, PA-C  promethazine (PHENERGAN) 25 MG tablet Take 25 mg  by mouth every 6 (six) hours as needed for nausea or vomiting.    [provider]  rizatriptan (MAXALT) 10 MG tablet Take 10 mg by mouth See admin instructions. 10 mg as needed for migraine, may repeat 10 mg in 2 hours if headache persists or recurs.    [provider]      Allergies    Ultram [tramadol], Aleve [naproxen], Coconut (cocos nucifera), Depakote [divalproex sodium], Nsaids, and Pineapple    Review of Systems   Review of Systems  Unable to perform ROS: Acuity of condition    Physical Exam Updated Vital Signs BP 135/74   Pulse 73   Temp 97.7 F (36.5  C)   Resp (!) 21   SpO2 97%  Physical Exam Vitals and nursing note reviewed.  Constitutional:      General: She is not in acute distress.    Appearance: She is well-developed. She is ill-appearing.  HENT:     Head: Normocephalic and atraumatic.  Eyes:     Conjunctiva/sclera: Conjunctivae normal.  Cardiovascular:     Rate and Rhythm: Regular rhythm.  Pulmonary:     Effort: Pulmonary effort is normal. No respiratory distress.     Breath sounds: Normal breath sounds. No stridor.  Abdominal:     General: There is no distension.  Skin:    General: Skin is warm and dry.  Neurological:     Mental Status: She is alert.     Cranial Nerves: No cranial nerve deficit.     Comments: Patient is oriented x 3, but has substantial atrophy, listlessness, and muscle wasting.  Psychiatric:     Comments: Withdrawn, slow to respond.     ED Results / Procedures / Treatments   Labs (all labs ordered are listed, but only abnormal results are displayed) Labs Reviewed  CBC - Abnormal; Notable for the following components:      Result Value   RBC 3.74 (*)    Hemoglobin 11.9 (*)    All other components within normal limits  COMPREHENSIVE METABOLIC PANEL - Abnormal; Notable for the following components:   Potassium 3.2 (*)    Glucose, Bld 152 (*)    BUN 25 (*)    Calcium 8.3 (*)    Total Protein 6.4 (*)    Albumin 3.1 (*)    AST 13 (*)    Alkaline Phosphatase 335 (*)    All other components within normal limits  CBG MONITORING, ED - Abnormal; Notable for the following components:   Glucose-Capillary 167 (*)    All other components within normal limits  URINALYSIS, ROUTINE W REFLEX MICROSCOPIC    EKG EKG Interpretation  Date/Time:  Sunday December 15 2022 13:29:20 EDT Ventricular Rate:  72 PR Interval:  169 QRS Duration: 104 QT Interval:  423 QTC Calculation: 463 R Axis:   10 Text Interpretation: Sinus rhythm ST-t wave abnormality Artifact Abnormal ECG Confirmed by Gerhard Munch  [phone]) on 12/15/2022 2:05:52 PM  Radiology DG Chest Portable 1 View  Result Date: 12/15/2022 CLINICAL DATA:  Weakness, lethargy EXAM: PORTABLE CHEST 1 VIEW COMPARISON:  12/04/2022 FINDINGS: Single frontal view of the chest demonstrates an unremarkable cardiac silhouette. No airspace disease, effusion, or pneumothorax. Stable postsurgical changes throughout the spine. No acute fracture. IMPRESSION: 1. No acute intrathoracic process. Electronically Signed   By: Sharlet Salina M.D.   On: 12/15/2022 14:44    Procedures Procedures    Medications Ordered in ED Medications  sodium chloride 0.9 % bolus 1,000 mL (  1,000 mLs Intravenous New Bag/Given 12/15/22 1424)  ondansetron (ZOFRAN) injection 4 mg (4 mg Intravenous Given 12/15/22 1426)    ED Course/ Medical Decision Making/ A&P                             Medical Decision Making Patient with multiple medical issues including seizures, hypertension, recent hip surgery presents with listlessness, nausea, vomiting. Concern for polypharmacy versus dehydration, absent fever, lower suspicion for bacteremia, sepsis. Patient had IV fluid resuscitation, labs, Zofran. Cardiac 75 sinus normal Pulse ox 100% room air normal   Amount and/or Complexity of Data Reviewed Independent Historian:     Details: Patient's son provides history External Data Reviewed: notes. Labs: ordered. Decision-making details documented in ED Course. Radiology: ordered. ECG/medicine tests: ordered and independent interpretation performed. Decision-making details documented in ED Course.  Risk Prescription drug management. Decision regarding hospitalization. Diagnosis or treatment significantly limited by social determinants of health.   3:50 PM Repeat exam patient is awake, alert, pleasantly interactive, much more awake, no vomiting. Patient's hip reviewed, no notable changes, little evidence for intra-articular infection. We reviewed labs thus far, no evidence for  substantial abnormalities, trivial electrolyte disturbance, mild dehydration.  Patient is awaiting urinalysis, but without evidence of bacteremia, sepsis without any ongoing mental status changes, without distress, patient appropriate for additional outpatient management.  Given her decline, according to family emergency I did discuss nursing home placement with her, she declines this.  On signout patient is awaiting UA.        Final Clinical Impression(s) / ED Diagnoses Final diagnoses:  Weakness  Post-operative nausea and vomiting    Rx / DC Orders ED Discharge Orders     None         Gerhard Munch, MD 12/15/22 1550

## 2022-12-15 NOTE — Discharge Instructions (Addendum)
Today's evaluation has been generally reassuring.  Your medication regimen has been adjusted, and this should help your recovery.  If you do decide you need additional assistance, with nursing home or otherwise, do not hesitate to return here.

## 2022-12-15 NOTE — ED Provider Notes (Signed)
Urinalysis returned and does not have any signs consistent with urinary tract infection.  Patient discharged.  Will have her follow-up with her primary doctor in several days   Rondel Baton, MD 12/15/22 1859

## 2022-12-15 NOTE — ED Triage Notes (Signed)
Pt reports generalized weakness and lethargy. Per the son pt had left hip replacement 3 weeks ago. Since that time pt was prescribed xanax and opiates. Pt reports taking hydrocodone this morning.  Pt oriented x 4 at this time. Denies pain.

## 2022-12-16 ENCOUNTER — Encounter (HOSPITAL_COMMUNITY)
Admission: RE | Admit: 2022-12-16 | Discharge: 2022-12-16 | Disposition: A | Discharge status: Home / Self Care | Payer: 59 | Source: Ambulatory Visit | Attending: Internal Medicine | Admitting: Internal Medicine

## 2022-12-16 ENCOUNTER — Ambulatory Visit (HOSPITAL_COMMUNITY)
Admission: RE | Admit: 2022-12-16 | Discharge: 2022-12-16 | Disposition: A | Discharge status: Home / Self Care | Payer: 59 | Source: Ambulatory Visit | Attending: Internal Medicine | Admitting: Internal Medicine

## 2022-12-16 DIAGNOSIS — R079 Chest pain, unspecified: Secondary | ICD-10-CM | POA: Diagnosis not present

## 2022-12-16 LAB — NM MYOCAR MULTI W/SPECT W/WALL MOTION / EF
Base ST Depression (mm): 0 mm
LV dias vol: 46 mL (ref 46–106)
LV sys vol: 14 mL
Nuc Stress EF: 70 %
Peak HR: 88 {beats}/min
RATE: 0.1
Rest HR: 70 {beats}/min
Rest Nuclear Isotope Dose: 9.4 mCi
SDS: 2
SRS: 0
SSS: 2
ST Depression (mm): 0 mm
Stress Nuclear Isotope Dose: 30 mCi
TID: 1.88

## 2022-12-16 MED ORDER — SODIUM CHLORIDE FLUSH 0.9 % IV SOLN
INTRAVENOUS | Status: AC
  Administered 2022-12-16: 10 mL via INTRAVENOUS
  Filled 2022-12-16: qty 10

## 2022-12-16 MED ORDER — TECHNETIUM TC 99M TETROFOSMIN IV KIT
10.0000 | PACK | Freq: Once | INTRAVENOUS | Status: AC | PRN
  Administered 2022-12-16: 9.4 via INTRAVENOUS

## 2022-12-16 MED ORDER — REGADENOSON 0.4 MG/5ML IV SOLN
INTRAVENOUS | Status: AC
  Administered 2022-12-16: 0.4 mg via INTRAVENOUS
  Filled 2022-12-16: qty 5

## 2022-12-16 MED ORDER — TECHNETIUM TC 99M TETROFOSMIN IV KIT
30.0000 | PACK | Freq: Once | INTRAVENOUS | Status: AC | PRN
  Administered 2022-12-16: 30 via INTRAVENOUS

## 2022-12-24 ENCOUNTER — Encounter (HOSPITAL_COMMUNITY): Payer: 59 | Admitting: Physical Therapy

## 2022-12-27 ENCOUNTER — Encounter (HOSPITAL_COMMUNITY): Payer: Self-pay | Admitting: Physical Therapy

## 2022-12-27 ENCOUNTER — Ambulatory Visit (HOSPITAL_COMMUNITY): Payer: 59 | Attending: Orthopedic Surgery | Admitting: Physical Therapy

## 2022-12-27 DIAGNOSIS — R262 Difficulty in walking, not elsewhere classified: Secondary | ICD-10-CM | POA: Insufficient documentation

## 2022-12-27 DIAGNOSIS — M25552 Pain in left hip: Secondary | ICD-10-CM | POA: Insufficient documentation

## 2022-12-27 NOTE — Therapy (Signed)
OUTPATIENT PHYSICAL THERAPY LOWER EXTREMITY EVALUATION   [name] MRN: [id number] DOB:11-Sep-1970, 52 y.o., female Today's Date: 12/27/2022  END OF SESSION:  PT End of Session - 12/27/22 1344     Visit Number 2    Number of Visits 12    Date for PT Re-Evaluation 01/23/23    Authorization Type [employer]    PT Start Time 1345    PT Stop Time 1425    PT Time Calculation (min) 40 min    Activity Tolerance Patient limited by pain    Behavior During Therapy Anxious             Past Medical History:  Diagnosis Date   Bruising, spontaneous 12/08/2013   Cancer (HCC)    cervical cancer - had hysterectomy   Chronic back pain    Chronic back pain    DDD (degenerative disc disease), lumbar    DDD (degenerative disc disease), lumbar    Fibromyalgia    Headache(784.0)    migraines   Ovarian cyst    Partial tear subscapularis tendon 12/27/2011   Scoliosis    Seizures (HCC)    started 9/12-   Past Surgical History:  Procedure Laterality Date   ABDOMINAL HYSTERECTOMY  2012   BACK SURGERY  1989   scoliosis throsic-rods   CESAREAN SECTION     CHOLECYSTECTOMY N/A 11/17/2012   Procedure: LAPAROSCOPIC CHOLECYSTECTOMY WITH INTRAOPERATIVE CHOLANGIOGRAM;  Surgeon: Axel Filler, MD;  Location: [facility] OR;  Service: General;  Laterality: N/A;   COLONOSCOPY WITH PROPOFOL N/A 04/21/2020   Procedure: COLONOSCOPY WITH PROPOFOL;  Surgeon: Jeani Hawking, MD;  Location: [facility] ENDOSCOPY;  Service: Endoscopy;  Laterality: N/A;   ENDOSCOPIC RETROGRADE CHOLANGIOPANCREATOGRAPHY (ERCP) WITH PROPOFOL N/A 06/11/2019   Procedure: ENDOSCOPIC RETROGRADE CHOLANGIOPANCREATOGRAPHY (ERCP) WITH PROPOFOL;  Surgeon: Jeani Hawking, MD;  Location: [facility];  Service: Endoscopy;  Laterality: N/A;   ERCP N/A 05/07/2013   Procedure: ENDOSCOPIC RETROGRADE CHOLANGIOPANCREATOGRAPHY (ERCP);  Surgeon: Theda Belfast, MD;  Location: [facility];  Service: Endoscopy;  Laterality: N/A;  start ercp first, if canulation  fails switch to EUS    ESOPHAGOGASTRODUODENOSCOPY (EGD) WITH PROPOFOL N/A 05/15/2019   Procedure: ESOPHAGOGASTRODUODENOSCOPY (EGD) WITH PROPOFOL;  Surgeon: West Bali, MD;  [facility]: [facility];  Service: Endoscopy;  Laterality: N/A;   ESOPHAGOGASTRODUODENOSCOPY (EGD) WITH PROPOFOL N/A 03/03/2020   Procedure: ESOPHAGOGASTRODUODENOSCOPY (EGD) WITH PROPOFOL;  Surgeon: Jeani Hawking, MD;  Location: [facility];  Service: Endoscopy;  Laterality: N/A;   ESOPHAGOGASTRODUODENOSCOPY (EGD) WITH PROPOFOL N/A 04/21/2020   Procedure: ESOPHAGOGASTRODUODENOSCOPY (EGD) WITH PROPOFOL;  Surgeon: Jeani Hawking, MD;  Location: [facility];  Service: Endoscopy;  Laterality: N/A;   EUS N/A 11/03/2012   Procedure: UPPER ENDOSCOPIC ULTRASOUND (EUS) LINEAR;  Surgeon: Theda Belfast, MD;  Location: [facility];  Service: Endoscopy;  Laterality: N/A;   left arm     NECK SURGERY     POLYPECTOMY  04/21/2020   Procedure: POLYPECTOMY;  Surgeon: Jeani Hawking, MD;  Location: [facility];  Service: Endoscopy;;   REMOVAL OF STONES  06/11/2019   Procedure: REMOVAL OF STONES;  Surgeon: Jeani Hawking, MD;  Location: [facility];  Service: Endoscopy;;   TOOTH EXTRACTION N/A 05/24/2022   Procedure: DENTAL RESTORATION/EXTRACTIONS;  Surgeon: Ocie Doyne, DMD;  Location: [facility] OR;  Service: Oral Surgery;  Laterality: N/A;   TOTAL HIP ARTHROPLASTY Left 11/17/2022   Procedure: TOTAL HIP ARTHROPLASTY ANTERIOR APPROACH;  Surgeon: Sheral Apley, MD;  Location: [facility] OR;  Service: Orthopedics;  Laterality: Left;  TUBAL LIGATION     UPPER ESOPHAGEAL ENDOSCOPIC ULTRASOUND (EUS) N/A 06/11/2019   Procedure: UPPER ESOPHAGEAL ENDOSCOPIC ULTRASOUND (EUS);  Surgeon: Jeani Hawking, MD;  Location: [facility];  Service: Endoscopy;  Laterality: N/A;   Patient Active Problem List   Diagnosis Date Noted   Palpitations 12/05/2022   Transaminitis 11/16/2022   Hip fracture (HCC) 11/15/2022   GERD (gastroesophageal reflux disease)  11/15/2022   Tobacco use 11/15/2022   Idiopathic hypotension 07/19/2022   Slurred speech 07/19/2022   Underweight 07/19/2022   Hypotension 07/19/2022   Seizure disorder (HCC) 07/18/2022   Chronic migraine w/o aura w/o status migrainosus, not intractable 07/18/2022   Cerebral cavernoma 07/18/2022   Acute GI bleed due to bleeding pyloric/gastric ulcer 05/15/2019   PUD (peptic ulcer disease)-EGD 05/15/2019 with bleeding pyloric/gastric ulcer, nonbleeding duodenal ulcers and gastritis    Hematemesis with nausea    Migraine headache 09/25/2015   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   Bilateral occipital neuralgia 01/02/2015   Migraine 01/02/2015   Chest pain of uncertain etiology 11/29/2014   DDD (degenerative disc disease), cervical 10/27/2014   DDD (degenerative disc disease), 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   DDD (degenerative disc disease), lumbosacral 10/27/2014   Hypokalemia 05/07/2013   Partial tear subscapularis tendon 12/27/2011    PCP: Elfredia Nevins  REFERRING PROVIDER: Sheral Apley, MD  REFERRING DIAG: [phone] (ICD-10-CM) - Status post left hip replacement  THERAPY DIAG:  Pain in left hip  Difficulty in walking, not elsewhere classified  Rationale for Evaluation and Treatment: Rehabilitation  ONSET DATE: 11/17/22  SUBJECTIVE:   SUBJECTIVE STATEMENT: Patient states some dizzy spells. Has not fallen since initial evaluation day. She does fall back on couch after getting up. Walking with cane today.  EVAL: 11/12/22 left hip fracture (did not go to hospital until 11/15/22)  so then 11/17/22 had THA per Dr. [name].  3 days in hospital after surgery; anterior approach.   Prior to surgery did not use AD. Arrives with daughter in [facility];  states she fell this morning just prior to PT appt. ; also states she fell multiple times yesterday.  before surgery had multiple falls.  using a RW since surgery.   Patient is very weepy; anxious from fall this morning.  Bent to pick up a  pill on the floor and fell in hallway today.  Dentures are loose and speech is slurred; daughter states she is slightly more slurred than at baseline.    PERTINENT HISTORY: Chronic pain; takes multiple medications Chronic migraine Seizure fibromyalgia PAIN:  Are you having pain? Yes: NPRS scale: 5/10 Pain location: hip and back Pain description: aches, sore Aggravating factors: fell this morning Relieving factors: heat, meds  PRECAUTIONS: Fall  WEIGHT BEARING RESTRICTIONS: No  FALLS:  Has patient fallen in last 6 months? Yes. Number of falls 10 plus  LIVING ENVIRONMENT: Lives with: lives with their family Lives in: House/apartment Stairs: Yes: External: 5 steps; on left going up Has following equipment at home: Dan Humphreys - 2 wheeled and Wheelchair (manual)  OCCUPATION: retired  PLOF: Independent with household mobility with device  PATIENT GOALS: get rid of the walker  NEXT MD VISIT: 01/01/23  OBJECTIVE:   Steri strips remain left anterior hip; sits in wc with left knee valgus; left hip adduction  DIAGNOSTIC FINDINGS:  CLINICAL DATA:  Elective surgery. Intraoperative fluoroscopy for total left hip arthroplasty.   EXAM: DG HIP (WITH OR WITHOUT PELVIS) 1V*L*   COMPARISON:  Pelvis and left hip radiographs 11/15/2022   FINDINGS:  Images were performed intraoperatively without the presence of a radiologist. New total left hip arthroplasty. No hardware complication is seen.   Total fluoroscopy images: 2   Total fluoroscopy time: 21 seconds   Total dose: Radiation Exposure Index (as provided by the fluoroscopic device): 2.01 mGy air Kerma   Please see intraoperative findings for further detail.   IMPRESSION: Intraoperative fluoroscopy for total left hip arthroplasty.   PATIENT SURVEYS:  FOTO 0.54  COGNITION: Overall cognitive status: Impaired Per daughter's report    SENSATION: Not tested  EDEMA:  No significant swelling noted  POSTURE: rounded  shoulders, forward head, flexed trunk , weight shift right, and flexed knees  PALPATION: General soreness left hip but no severe pain with touch  LOWER EXTREMITY ROM:  Active ROM Right eval Left eval  Hip flexion    Hip extension    Hip abduction    Hip adduction    Hip internal rotation    Hip external rotation    Knee flexion    Knee extension    Ankle dorsiflexion    Ankle plantarflexion    Ankle inversion    Ankle eversion     (Blank rows = not tested)  LOWER EXTREMITY MMT:  MMT Right eval Left eval  Hip flexion  3-  Hip extension    Hip abduction    Hip adduction    Hip internal rotation    Hip external rotation    Knee flexion    Knee extension  2+  Ankle dorsiflexion  4  Ankle plantarflexion    Ankle inversion    Ankle eversion     (Blank rows = not tested)   FUNCTIONAL TESTS:  30 seconds chair stand test x 1 to RW (CGA for safety)  GAIT: Distance walked: 0 Assistive device utilized: Wheelchair (manual) left walker in the car Level of assistance: CGA Comments: unable to fully extend knees   TODAY'S TREATMENT:                                                                                                                              DATE:  12/27/22 Bridge 2 x 10  SLR 2 x 10  Sidelying clam 2 x 15 Sidelying hip abduction 2 x 10  Gait with SPC 226 feet with min G LAQ 10 x 5 second holds  12/11/2022 physical therapy evaluation and HEP    PATIENT EDUCATION:  Education details: 12/27/22: HEP;   EVAL:Patient educated on exam findings, POC, scope of PT, HEP, and further testing next visit; PT recommendation to go to ED per history of frequent falls; notified MD office of falls. Person educated: Patient and daughter Education method: Explanation, Demonstration, and Handouts Education comprehension: verbalized understanding, returned demonstration, verbal cues required, and tactile cues required  HOME EXERCISE PROGRAM: Access Code: [email or web address] URL:  [email or web address]  Date: 12/27/2022 - Supine Bridge  - 1 x daily - 7 x weekly - 3 sets - 10 reps -  Active Straight Leg Raise with Quad Set (Mirrored)  - 1 x daily - 7 x weekly - 3 sets - 10 reps - Clamshell  - 1 x daily - 7 x weekly - 2 sets - 15 reps - Sidelying Hip Abduction (Mirrored)  - 1 x daily - 7 x weekly - 2 sets - 10 reps - Seated Long Arc Quad  - 1 x daily - 7 x weekly - 2 sets - 10 reps - 5 second  hold  ASSESSMENT:  CLINICAL IMPRESSION: Began table strengthening exercises which are tolerated well and gave updated HEP print outs. Patient demonstrating improving gait with RW. Mild to moderate unsteadiness with ambulation with SPC but no physical assist required to maintain balance. Patient will continue to benefit from physical therapy in order to improve function and reduce impairment.    EVAL: Patient is a 52 y.o. female who was seen today for physical therapy evaluation and treatment for s/p left THA anterior approach per Dr. [name]. Patient with a fall just prior to PT appt today and had to have 2 people assist her up out of the floor. Also states she had multiple falls yesterday.   Patient declines ED visit.  PT called Dr. Greig Right office to notify of falls and left a message with Jewel Baize.  Question possible polypharmacy issue? Patient is quite petite and spoke with patient about the effects of strong medication on a person with small body mass.  We only assessed sit to stand transfer today and will assess further next visit.  Encouraged patient to continue with HEP from home health and to go to ED if she has any more falls.  Also encouraged patient to use walker as she starts crying saying she wants to wean off cane.  PT explained this is not safe for her currently with her fall history.   Patient demonstrates muscle weakness, reduced ROM, and fascial restrictions which are likely contributing to symptoms of pain and are negatively impacting patient ability to perform  ADLs and functional mobility tasks. Patient will benefit from skilled physical therapy services to address these deficits to reduce pain and improve level of function with ADLs and functional mobility tasks.   OBJECTIVE IMPAIRMENTS: Abnormal gait, decreased activity tolerance, decreased balance, decreased coordination, decreased endurance, decreased knowledge of use of DME, decreased mobility, difficulty walking, decreased ROM, decreased strength, decreased safety awareness, hypomobility, increased fascial restrictions, impaired perceived functional ability, impaired flexibility, and pain.   ACTIVITY LIMITATIONS: carrying, lifting, bending, sitting, standing, squatting, sleeping, stairs, transfers, bed mobility, bathing, toileting, dressing, hygiene/grooming, and locomotion level  PARTICIPATION LIMITATIONS: meal prep, cleaning, laundry, driving, shopping, community activity, and yard work  [facility] POTENTIAL: Fair due to frequent fall history  CLINICAL DECISION MAKING: Evolving/moderate complexity  EVALUATION COMPLEXITY: Moderate   GOALS: Goals reviewed with patient? No  SHORT TERM GOALS: Target date: 01/09/2023 patient will be independent with initial HEP Baseline: Goal status: INITIAL  2.  Patient will self report 30% improvement to improve tolerance for functional activity  Baseline:  Goal status: INITIAL    LONG TERM GOALS: Target date: 01/23/2023  Patient will be independent in self management strategies to improve quality of life and functional outcomes.   Baseline:  Goal status: INITIAL  2.  Patient will self report 50% improvement to improve tolerance for functional activity  Baseline:  Goal status: INITIAL  3.  Patient will improve FOTO score to 40  Baseline: 0.53 Goal status: INITIAL  4.  Patient will remain free of  falls Baseline:  Goal status: INITIAL  5.  Patient will ambulate with LRAD x 224 ft in 2 min or less to demonstrate improved functional  mobility Baseline:  Goal status: INITIAL  6.  Patient will increase left leg MMTs to 4+ to 5/5 without pain to promote return to ambulation community distances with minimal deviation.  Baseline: see above Goal status: INITIAL   PLAN:  PT FREQUENCY: 2x/week  PT DURATION: 4 weeks  PLANNED INTERVENTIONS: Therapeutic exercises, Therapeutic activity, Neuromuscular re-education, Balance training, Gait training, Patient/Family education, Joint manipulation, Joint mobilization, Stair training, Orthotic/Fit training, DME instructions, Aquatic Therapy, Dry Needling, Electrical stimulation, Spinal manipulation, Spinal mobilization, Cryotherapy, Moist heat, Compression bandaging, scar mobilization, Splintting, Taping, Traction, Ultrasound, Ionotophoresis 4mg /ml Dexamethasone, and Manual therapy   PLAN FOR NEXT SESSION: initiate HEP and review goals; try ambulation with RW; AROM left hip and knee; gait training; balance and strengthening as able; high fall risk   2:30 PM, 12/27/22 Wyman Songster PT, DPT Physical Therapist at [facility]

## 2023-01-01 ENCOUNTER — Ambulatory Visit (HOSPITAL_COMMUNITY): Payer: 59 | Admitting: Physical Therapy

## 2023-01-01 DIAGNOSIS — R262 Difficulty in walking, not elsewhere classified: Secondary | ICD-10-CM

## 2023-01-01 DIAGNOSIS — M25552 Pain in left hip: Secondary | ICD-10-CM | POA: Diagnosis not present

## 2023-01-01 NOTE — Therapy (Signed)
OUTPATIENT PHYSICAL THERAPY TREATMENT   [name] MRN: [id number] DOB:1970-11-16, 52 y.o., female Today's Date: 01/01/2023  END OF SESSION:  PT End of Session - 01/01/23 1551     Visit Number 3    Number of Visits 12    Date for PT Re-Evaluation 01/23/23    Authorization Type [employer]    PT Start Time 1600    PT Stop Time 1643    PT Time Calculation (min) 43 min    Activity Tolerance Patient limited by pain    Behavior During Therapy Anxious             Past Medical History:  Diagnosis Date   Bruising, spontaneous 12/08/2013   Cancer (HCC)    cervical cancer - had hysterectomy   Chronic back pain    Chronic back pain    DDD (degenerative disc disease), lumbar    DDD (degenerative disc disease), lumbar    Fibromyalgia    Headache(784.0)    migraines   Ovarian cyst    Partial tear subscapularis tendon 12/27/2011   Scoliosis    Seizures (HCC)    started 9/12-   Past Surgical History:  Procedure Laterality Date   ABDOMINAL HYSTERECTOMY  2012   BACK SURGERY  1989   scoliosis throsic-rods   CESAREAN SECTION     CHOLECYSTECTOMY N/A 11/17/2012   Procedure: LAPAROSCOPIC CHOLECYSTECTOMY WITH INTRAOPERATIVE CHOLANGIOGRAM;  Surgeon: Axel Filler, MD;  Location: [facility];  Service: General;  Laterality: N/A;   COLONOSCOPY WITH PROPOFOL N/A 04/21/2020   Procedure: COLONOSCOPY WITH PROPOFOL;  Surgeon: Jeani Hawking, MD;  Location: [facility];  Service: Endoscopy;  Laterality: N/A;   ENDOSCOPIC RETROGRADE CHOLANGIOPANCREATOGRAPHY (ERCP) WITH PROPOFOL N/A 06/11/2019   Procedure: ENDOSCOPIC RETROGRADE CHOLANGIOPANCREATOGRAPHY (ERCP) WITH PROPOFOL;  Surgeon: Jeani Hawking, MD;  Location: [facility];  Service: Endoscopy;  Laterality: N/A;   ERCP N/A 05/07/2013   Procedure: ENDOSCOPIC RETROGRADE CHOLANGIOPANCREATOGRAPHY (ERCP);  Surgeon: Theda Belfast, MD;  Location: [facility];  Service: Endoscopy;  Laterality: N/A;  start ercp first, if canulation fails switch to  EUS    ESOPHAGOGASTRODUODENOSCOPY (EGD) WITH PROPOFOL N/A 05/15/2019   Procedure: ESOPHAGOGASTRODUODENOSCOPY (EGD) WITH PROPOFOL;  Surgeon: West Bali, MD;  Location: [facility];  Service: Endoscopy;  Laterality: N/A;   ESOPHAGOGASTRODUODENOSCOPY (EGD) WITH PROPOFOL N/A 03/03/2020   Procedure: ESOPHAGOGASTRODUODENOSCOPY (EGD) WITH PROPOFOL;  Surgeon: Jeani Hawking, MD;  Location: [facility];  Service: Endoscopy;  Laterality: N/A;   ESOPHAGOGASTRODUODENOSCOPY (EGD) WITH PROPOFOL N/A 04/21/2020   Procedure: ESOPHAGOGASTRODUODENOSCOPY (EGD) WITH PROPOFOL;  Surgeon: Jeani Hawking, MD;  Location: [facility];  Service: Endoscopy;  Laterality: N/A;   EUS N/A 11/03/2012   Procedure: UPPER ENDOSCOPIC ULTRASOUND (EUS) LINEAR;  Surgeon: Theda Belfast, MD;  Location: [facility];  Service: Endoscopy;  Laterality: N/A;   left arm     NECK SURGERY     POLYPECTOMY  04/21/2020   Procedure: POLYPECTOMY;  Surgeon: Jeani Hawking, MD;  Location: [facility];  Service: Endoscopy;;   REMOVAL OF STONES  06/11/2019   Procedure: REMOVAL OF STONES;  Surgeon: Jeani Hawking, MD;  Location: [facility];  Service: Endoscopy;;   TOOTH EXTRACTION N/A 05/24/2022   Procedure: DENTAL RESTORATION/EXTRACTIONS;  Surgeon: Ocie Doyne, DMD;  Location: [facility];  Service: Oral Surgery;  Laterality: N/A;   TOTAL HIP ARTHROPLASTY Left 11/17/2022   Procedure: TOTAL HIP ARTHROPLASTY ANTERIOR APPROACH;  Surgeon: Sheral Apley, MD;  Location: [facility];  Service: Orthopedics;  Laterality: Left;   TUBAL LIGATION  UPPER ESOPHAGEAL ENDOSCOPIC ULTRASOUND (EUS) N/A 06/11/2019   Procedure: UPPER ESOPHAGEAL ENDOSCOPIC ULTRASOUND (EUS);  Surgeon: Jeani Hawking, MD;  Location: [facility];  Service: Endoscopy;  Laterality: N/A;   Patient Active Problem List   Diagnosis Date Noted   Palpitations 12/05/2022   Transaminitis 11/16/2022   Hip fracture (HCC) 11/15/2022   GERD (gastroesophageal reflux disease) 11/15/2022   Tobacco use  11/15/2022   Idiopathic hypotension 07/19/2022   Slurred speech 07/19/2022   Underweight 07/19/2022   Hypotension 07/19/2022   Seizure disorder (HCC) 07/18/2022   Chronic migraine w/o aura w/o status migrainosus, not intractable 07/18/2022   Cerebral cavernoma 07/18/2022   Acute GI bleed due to bleeding pyloric/gastric ulcer 05/15/2019   PUD (peptic ulcer disease)-EGD 05/15/2019 with bleeding pyloric/gastric ulcer, nonbleeding duodenal ulcers and gastritis    Hematemesis with nausea    Migraine headache 09/25/2015   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   Bilateral occipital neuralgia 01/02/2015   Migraine 01/02/2015   Chest pain of uncertain etiology 11/29/2014   DDD (degenerative disc disease), cervical 10/27/2014   DDD (degenerative disc disease), 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   DDD (degenerative disc disease), lumbosacral 10/27/2014   Hypokalemia 05/07/2013   Partial tear subscapularis tendon 12/27/2011    PCP: Elfredia Nevins  REFERRING PROVIDER: Sheral Apley, MD  REFERRING DIAG: [phone] (ICD-10-CM) - Status post left hip replacement  THERAPY DIAG:  Pain in left hip  Difficulty in walking, not elsewhere classified  Rationale for Evaluation and Treatment: Rehabilitation  ONSET DATE: 11/17/22  SUBJECTIVE:   SUBJECTIVE STATEMENT: Patient states she just left the Dr. Isidore Moos and he wants her to start using her cane.  States she's been practicing at home and feels so much safer;  ready to get rid of the walker.  No dizzy spells since  her last visit. Marland Kitchen  EVAL: 11/12/22 left hip fracture (did not go to hospital until 11/15/22)  so then 11/17/22 had THA per Dr. [name].  3 days in hospital after surgery; anterior approach.   Prior to surgery did not use AD. Arrives with daughter in [facility];  states she fell this morning just prior to PT appt. ; also states she fell multiple times yesterday.  before surgery had multiple falls.  using a RW since surgery.   Patient is very weepy;  anxious from fall this morning.  Bent to pick up a pill on the floor and fell in hallway today.  Dentures are loose and speech is slurred; daughter states she is slightly more slurred than at baseline.    PERTINENT HISTORY: Chronic pain; takes multiple medications Chronic migraine Seizure fibromyalgia PAIN:  Are you having pain? Yes: NPRS scale: 5/10 Pain location: hip and back Pain description: aches, sore Aggravating factors: fell this morning Relieving factors: heat, meds  PRECAUTIONS: Fall  WEIGHT BEARING RESTRICTIONS: No  FALLS:  Has patient fallen in last 6 months? Yes. Number of falls 10 plus  LIVING ENVIRONMENT: Lives with: lives with their family Lives in: House/apartment Stairs: Yes: External: 5 steps; on left going up Has following equipment at home: Dan Humphreys - 2 wheeled and Wheelchair (manual)  OCCUPATION: retired  PLOF: Independent with household mobility with device  PATIENT GOALS: get rid of the walker  NEXT MD VISIT: 01/01/23  OBJECTIVE:   Steri strips remain left anterior hip; sits in wc with left knee valgus; left hip adduction  DIAGNOSTIC FINDINGS:  CLINICAL DATA:  Elective surgery. Intraoperative fluoroscopy for total left hip arthroplasty.   EXAM: DG HIP (WITH OR  WITHOUT PELVIS) 1V*L*   COMPARISON:  Pelvis and left hip radiographs 11/15/2022   FINDINGS: Images were performed intraoperatively without the presence of a radiologist. New total left hip arthroplasty. No hardware complication is seen.   Total fluoroscopy images: 2   Total fluoroscopy time: 21 seconds   Total dose: Radiation Exposure Index (as provided by the fluoroscopic device): 2.01 mGy air Kerma   Please see intraoperative findings for further detail.   IMPRESSION: Intraoperative fluoroscopy for total left hip arthroplasty.   PATIENT SURVEYS:  FOTO 0.54  COGNITION: Overall cognitive status: Impaired Per daughter's report    SENSATION: Not tested  EDEMA:  No  significant swelling noted  POSTURE: rounded shoulders, forward head, flexed trunk , weight shift right, and flexed knees  PALPATION: General soreness left hip but no severe pain with touch  LOWER EXTREMITY ROM:  Active ROM Right eval Left eval  Hip flexion    Hip extension    Hip abduction    Hip adduction    Hip internal rotation    Hip external rotation    Knee flexion    Knee extension    Ankle dorsiflexion    Ankle plantarflexion    Ankle inversion    Ankle eversion     (Blank rows = not tested)  LOWER EXTREMITY MMT:  MMT Right eval Left eval  Hip flexion  3-  Hip extension    Hip abduction    Hip adduction    Hip internal rotation    Hip external rotation    Knee flexion    Knee extension  2+  Ankle dorsiflexion  4  Ankle plantarflexion    Ankle inversion    Ankle eversion     (Blank rows = not tested)   FUNCTIONAL TESTS:  30 seconds chair stand test x 1 to RW (CGA for safety)  GAIT: Distance walked: 0 Assistive device utilized: Wheelchair (manual) left walker in the car Level of assistance: CGA Comments: unable to fully extend knees   TODAY'S TREATMENT:                                                                                                                              DATE:  01/01/23 Gait with SPC 226 feet with Supervision, no LOB Standing: tandem 2X30" each LE lead  Alternating high hold march with 1 UE assist 10X  12/27/22 Bridge 2 x 10  SLR 2 x 10  Sidelying clam 2 x 15 Sidelying hip abduction 2 x 10  Gait with SPC 226 feet with min G LAQ 10 x 5 second holds  12/11/2022 physical therapy evaluation and HEP    PATIENT EDUCATION:  Education details: 12/27/22: HEP;   EVAL:Patient educated on exam findings, POC, scope of PT, HEP, and further testing next visit; PT recommendation to go to ED per history of frequent falls; notified MD office of falls. Person educated: Patient and daughter Education method: Explanation, Demonstration,  and Handouts  Education comprehension: verbalized understanding, returned demonstration, verbal cues required, and tactile cues required  HOME EXERCISE PROGRAM: Access Code: [email or web address] URL: [email or web address]  Date: 12/27/2022 - Supine Bridge  - 1 x daily - 7 x weekly - 3 sets - 10 reps - Active Straight Leg Raise with Quad Set (Mirrored)  - 1 x daily - 7 x weekly - 3 sets - 10 reps - Clamshell  - 1 x daily - 7 x weekly - 2 sets - 15 reps - Sidelying Hip Abduction (Mirrored)  - 1 x daily - 7 x weekly - 2 sets - 10 reps - Seated Long Arc Quad  - 1 x daily - 7 x weekly - 2 sets - 10 reps - 5 second  hold  Date: 01/01/2023 Prepared by: Emeline Gins Exercises - Sit to Stand Without Arm Support  - 2 x daily - 7 x weekly - 1 sets - 10 reps - Standing 3-Way Kick  - 2 x daily - 7 x weekly - 1 sets - 10 reps - 5 sec hold  ASSESSMENT:  CLINICAL IMPRESSION: Pt comes today with wooden cane from home which is approx 1 foot too tall her her.  Demonstrated how she could have someone shorten the cane or purchase a new one.   Began with ambulation without LOB and presented with normal sequencing, no antalgia.  Progressed to static balance challenges with ability to complete full 30" in tandem with either LE lead, max of 10" Lt, 30" Rt  with SLS.  Pt able to negotiate 7" stairs with 1 HR reciprocally ascending but step to descending.   Added vectors and sit to stands to HEP.  Pt able to complete all exercises without instability, pain or issues.  Patient will continue to benefit from physical therapy in order to improve function and reduce impairment.   EVAL: Patient is a 52 y.o. female who was seen today for physical therapy evaluation and treatment for s/p left THA anterior approach per Dr. [name]. Patient with a fall just prior to PT appt today and had to have 2 people assist her up out of the floor. Also states she had multiple falls yesterday.   Patient declines ED visit.  PT called Dr.  Greig Right office to notify of falls and left a message with Jewel Baize.  Question possible polypharmacy issue? Patient is quite petite and spoke with patient about the effects of strong medication on a person with small body mass.  We only assessed sit to stand transfer today and will assess further next visit.  Encouraged patient to continue with HEP from home health and to go to ED if she has any more falls.  Also encouraged patient to use walker as she starts crying saying she wants to wean off cane.  PT explained this is not safe for her currently with her fall history.   Patient demonstrates muscle weakness, reduced ROM, and fascial restrictions which are likely contributing to symptoms of pain and are negatively impacting patient ability to perform ADLs and functional mobility tasks. Patient will benefit from skilled physical therapy services to address these deficits to reduce pain and improve level of function with ADLs and functional mobility tasks.   OBJECTIVE IMPAIRMENTS: Abnormal gait, decreased activity tolerance, decreased balance, decreased coordination, decreased endurance, decreased knowledge of use of DME, decreased mobility, difficulty walking, decreased ROM, decreased strength, decreased safety awareness, hypomobility, increased fascial restrictions, impaired perceived functional ability, impaired flexibility, and pain.   ACTIVITY LIMITATIONS: carrying, lifting, bending, sitting,  standing, squatting, sleeping, stairs, transfers, bed mobility, bathing, toileting, dressing, hygiene/grooming, and locomotion level  PARTICIPATION LIMITATIONS: meal prep, cleaning, laundry, driving, shopping, community activity, and yard work  [facility] POTENTIAL: Fair due to frequent fall history  CLINICAL DECISION MAKING: Evolving/moderate complexity  EVALUATION COMPLEXITY: Moderate   GOALS: Goals reviewed with patient? Yes  SHORT TERM GOALS: Target date: 01/09/2023 patient will be independent with initial  HEP Baseline: Goal status: IN PROGRESS  2.  Patient will self report 30% improvement to improve tolerance for functional activity  Baseline:  Goal status: IN PROGRESS    LONG TERM GOALS: Target date: 01/23/2023  Patient will be independent in self management strategies to improve quality of life and functional outcomes.   Baseline:  Goal status: IN PROGRESS  2.  Patient will self report 50% improvement to improve tolerance for functional activity  Baseline:  Goal status: IN PROGRESS  3.  Patient will improve FOTO score to 40  Baseline: 0.53 Goal status: IN PROGRESS  4.  Patient will remain free of falls Baseline:  Goal status: IN PROGRESS  5.  Patient will ambulate with LRAD x 224 ft in 2 min or less to demonstrate improved functional mobility Baseline:  Goal status: IN PROGRESS  6.  Patient will increase left leg MMTs to 4+ to 5/5 without pain to promote return to ambulation community distances with minimal deviation.  Baseline: see above Goal status: IN PROGRESS   PLAN:  PT FREQUENCY: 2x/week  PT DURATION: 4 weeks  PLANNED INTERVENTIONS: Therapeutic exercises, Therapeutic activity, Neuromuscular re-education, Balance training, Gait training, Patient/Family education, Joint manipulation, Joint mobilization, Stair training, Orthotic/Fit training, DME instructions, Aquatic Therapy, Dry Needling, Electrical stimulation, Spinal manipulation, Spinal mobilization, Cryotherapy, Moist heat, Compression bandaging, scar mobilization, Splintting, Taping, Traction, Ultrasound, Ionotophoresis 4mg /ml Dexamethasone, and Manual therapy   PLAN FOR NEXT SESSION: Continue to progress ambulation with LRAD;  Progress standing stability and strength.   Ambulate without AD next session.   4:51 PM, 01/01/23 Lurena Nida, PTA/CLT [facility] Ph: [phone]

## 2023-01-08 ENCOUNTER — Telehealth: Payer: Self-pay | Admitting: Internal Medicine

## 2023-01-08 ENCOUNTER — Ambulatory Visit (HOSPITAL_COMMUNITY): Payer: 59 | Admitting: Physical Therapy

## 2023-01-08 DIAGNOSIS — M25552 Pain in left hip: Secondary | ICD-10-CM

## 2023-01-08 DIAGNOSIS — I5043 Acute on chronic combined systolic (congestive) and diastolic (congestive) heart failure: Secondary | ICD-10-CM

## 2023-01-08 DIAGNOSIS — R262 Difficulty in walking, not elsewhere classified: Secondary | ICD-10-CM

## 2023-01-08 NOTE — Therapy (Signed)
OUTPATIENT PHYSICAL THERAPY TREATMENT   [name] MRN: [id number] DOB:1970/10/23, 52 y.o., female Today's Date: 01/08/2023  END OF SESSION:  PT End of Session - 01/08/23 1518     Visit Number 4    Number of Visits 12    Date for PT Re-Evaluation 01/23/23    Authorization Type [employer]    PT Start Time 1438    PT Stop Time 1518    PT Time Calculation (min) 40 min    Activity Tolerance Patient limited by pain    Behavior During Therapy Anxious             Past Medical History:  Diagnosis Date   Bruising, spontaneous 12/08/2013   Cancer (HCC)    cervical cancer - had hysterectomy   Chronic back pain    Chronic back pain    DDD (degenerative disc disease), lumbar    DDD (degenerative disc disease), lumbar    Fibromyalgia    Headache(784.0)    migraines   Ovarian cyst    Partial tear subscapularis tendon 12/27/2011   Scoliosis    Seizures (HCC)    started 9/12-   Past Surgical History:  Procedure Laterality Date   ABDOMINAL HYSTERECTOMY  2012   BACK SURGERY  1989   scoliosis throsic-rods   CESAREAN SECTION     CHOLECYSTECTOMY N/A 11/17/2012   Procedure: LAPAROSCOPIC CHOLECYSTECTOMY WITH INTRAOPERATIVE CHOLANGIOGRAM;  Surgeon: Axel Filler, MD;  Location: [facility];  Service: General;  Laterality: N/A;   COLONOSCOPY WITH PROPOFOL N/A 04/21/2020   Procedure: COLONOSCOPY WITH PROPOFOL;  Surgeon: Jeani Hawking, MD;  Location: [facility];  Service: Endoscopy;  Laterality: N/A;   ENDOSCOPIC RETROGRADE CHOLANGIOPANCREATOGRAPHY (ERCP) WITH PROPOFOL N/A 06/11/2019   Procedure: ENDOSCOPIC RETROGRADE CHOLANGIOPANCREATOGRAPHY (ERCP) WITH PROPOFOL;  Surgeon: Jeani Hawking, MD;  Location: [facility];  Service: Endoscopy;  Laterality: N/A;   ERCP N/A 05/07/2013   Procedure: ENDOSCOPIC RETROGRADE CHOLANGIOPANCREATOGRAPHY (ERCP);  Surgeon: Theda Belfast, MD;  Location: [facility];  Service: Endoscopy;  Laterality: N/A;  start ercp first, if canulation fails switch to  EUS    ESOPHAGOGASTRODUODENOSCOPY (EGD) WITH PROPOFOL N/A 05/15/2019   Procedure: ESOPHAGOGASTRODUODENOSCOPY (EGD) WITH PROPOFOL;  Surgeon: West Bali, MD;  Location: [facility];  Service: Endoscopy;  Laterality: N/A;   ESOPHAGOGASTRODUODENOSCOPY (EGD) WITH PROPOFOL N/A 03/03/2020   Procedure: ESOPHAGOGASTRODUODENOSCOPY (EGD) WITH PROPOFOL;  Surgeon: Jeani Hawking, MD;  Location: [facility];  Service: Endoscopy;  Laterality: N/A;   ESOPHAGOGASTRODUODENOSCOPY (EGD) WITH PROPOFOL N/A 04/21/2020   Procedure: ESOPHAGOGASTRODUODENOSCOPY (EGD) WITH PROPOFOL;  Surgeon: Jeani Hawking, MD;  Location: [facility];  Service: Endoscopy;  Laterality: N/A;   EUS N/A 11/03/2012   Procedure: UPPER ENDOSCOPIC ULTRASOUND (EUS) LINEAR;  Surgeon: Theda Belfast, MD;  Location: [facility];  Service: Endoscopy;  Laterality: N/A;   left arm     NECK SURGERY     POLYPECTOMY  04/21/2020   Procedure: POLYPECTOMY;  Surgeon: Jeani Hawking, MD;  Location: [facility];  Service: Endoscopy;;   REMOVAL OF STONES  06/11/2019   Procedure: REMOVAL OF STONES;  Surgeon: Jeani Hawking, MD;  Location: [facility];  Service: Endoscopy;;   TOOTH EXTRACTION N/A 05/24/2022   Procedure: DENTAL RESTORATION/EXTRACTIONS;  Surgeon: Ocie Doyne, DMD;  Location: [facility];  Service: Oral Surgery;  Laterality: N/A;   TOTAL HIP ARTHROPLASTY Left 11/17/2022   Procedure: TOTAL HIP ARTHROPLASTY ANTERIOR APPROACH;  Surgeon: Sheral Apley, MD;  Location: [facility];  Service: Orthopedics;  Laterality: Left;   TUBAL LIGATION  UPPER ESOPHAGEAL ENDOSCOPIC ULTRASOUND (EUS) N/A 06/11/2019   Procedure: UPPER ESOPHAGEAL ENDOSCOPIC ULTRASOUND (EUS);  Surgeon: Jeani Hawking, MD;  Location: [facility];  Service: Endoscopy;  Laterality: N/A;   Patient Active Problem List   Diagnosis Date Noted   Palpitations 12/05/2022   Transaminitis 11/16/2022   Hip fracture (HCC) 11/15/2022   GERD (gastroesophageal reflux disease) 11/15/2022   Tobacco use  11/15/2022   Idiopathic hypotension 07/19/2022   Slurred speech 07/19/2022   Underweight 07/19/2022   Hypotension 07/19/2022   Seizure disorder (HCC) 07/18/2022   Chronic migraine w/o aura w/o status migrainosus, not intractable 07/18/2022   Cerebral cavernoma 07/18/2022   Acute GI bleed due to bleeding pyloric/gastric ulcer 05/15/2019   PUD (peptic ulcer disease)-EGD 05/15/2019 with bleeding pyloric/gastric ulcer, nonbleeding duodenal ulcers and gastritis    Hematemesis with nausea    Migraine headache 09/25/2015   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   Bilateral occipital neuralgia 01/02/2015   Migraine 01/02/2015   Chest pain of uncertain etiology 11/29/2014   DDD (degenerative disc disease), cervical 10/27/2014   DDD (degenerative disc disease), 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   DDD (degenerative disc disease), lumbosacral 10/27/2014   Hypokalemia 05/07/2013   Partial tear subscapularis tendon 12/27/2011    PCP: Elfredia Nevins  REFERRING PROVIDER: Sheral Apley, MD  REFERRING DIAG: [phone] (ICD-10-CM) - Status post left hip replacement  THERAPY DIAG:  Pain in left hip  Difficulty in walking, not elsewhere classified  Rationale for Evaluation and Treatment: Rehabilitation  ONSET DATE: 11/17/22  SUBJECTIVE:   SUBJECTIVE STATEMENT: Pt states that she was doing well but woke up with swelling in both of her ankles, she has called her MD and her cardiologist.  Pt comes to the department walking with a cane.     EVAL: 11/12/22 left hip fracture (did not go to hospital until 11/15/22)  so then 11/17/22 had THA per Dr. [name].  3 days in hospital after surgery; anterior approach.   Prior to surgery did not use AD. Arrives with daughter in [facility];  states she fell this morning just prior to PT appt. ; also states she fell multiple times yesterday.  before surgery had multiple falls.  using a RW since surgery.   Patient is very weepy; anxious from fall this morning.  Bent to pick  up a pill on the floor and fell in hallway today.  Dentures are loose and speech is slurred; daughter states she is slightly more slurred than at baseline.    PERTINENT HISTORY: Chronic pain; takes multiple medications Chronic migraine Seizure fibromyalgia PAIN:  Are you having pain? Yes: NPRS scale: 7/10 Pain location: hip and back Pain description: aches, sore Aggravating factors: fell this morning Relieving factors: heat, meds  PRECAUTIONS: Fall  WEIGHT BEARING RESTRICTIONS: No  FALLS:  Has patient fallen in last 6 months? Yes. Number of falls 10 plus  LIVING ENVIRONMENT: Lives with: lives with their family Lives in: House/apartment Stairs: Yes: External: 5 steps; on left going up Has following equipment at home: Dan Humphreys - 2 wheeled and Wheelchair (manual)  OCCUPATION: retired  PLOF: Independent with household mobility with device  PATIENT GOALS: get rid of the walker  NEXT MD VISIT: 01/01/23  OBJECTIVE:   Steri strips remain left anterior hip; sits in wc with left knee valgus; left hip adduction  DIAGNOSTIC FINDINGS:  CLINICAL DATA:  Elective surgery. Intraoperative fluoroscopy for total left hip arthroplasty.   EXAM: DG HIP (WITH OR WITHOUT PELVIS) 1V*L*   COMPARISON:  Pelvis and  left hip radiographs 11/15/2022   FINDINGS: Images were performed intraoperatively without the presence of a radiologist. New total left hip arthroplasty. No hardware complication is seen.   Total fluoroscopy images: 2   Total fluoroscopy time: 21 seconds   Total dose: Radiation Exposure Index (as provided by the fluoroscopic device): 2.01 mGy air Kerma   Please see intraoperative findings for further detail.   IMPRESSION: Intraoperative fluoroscopy for total left hip arthroplasty.   PATIENT SURVEYS:  FOTO 0.54  COGNITION: Overall cognitive status: Impaired Per daughter's report    SENSATION: Not tested  EDEMA:  No significant swelling noted  POSTURE: rounded  shoulders, forward head, flexed trunk , weight shift right, and flexed knees  PALPATION: General soreness left hip but no severe pain with touch  LLOWER EXTREMITY MMT:  MMT Right eval Left eval  Hip flexion  3-  Hip extension    Hip abduction    Hip adduction    Hip internal rotation    Hip external rotation    Knee flexion    Knee extension  2+  Ankle dorsiflexion  4  Ankle plantarflexion    Ankle inversion    Ankle eversion     (Blank rows = not tested)   FUNCTIONAL TESTS:  30 seconds chair stand test x 1 to RW (CGA for safety)  GAIT: Distance walked: 0 Assistive device utilized: Wheelchair (manual) left walker in the car Level of assistance: CGA Comments: unable to fully extend knees   TODAY'S TREATMENT:                                                                                                                              DATE:  01/08/23 Standing Heel raise x 10 Toe raise x 10  Functional squat x 10 Sit to stand x 10  Marching x 10 Tandem stance  x 5  Step up 4" step x 10 B  Standing hamstring stretch with foot on 12" stool x 1 minute B x 2  Side step at // x 2 RT  Gait with no AD  01/01/23 Gait with SPC 226 feet with Supervision, no LOB Standing: tandem 2X30" each LE lead  Alternating high hold march with 1 UE assist 10X  12/27/22 Bridge 2 x 10  SLR 2 x 10  Sidelying clam 2 x 15 Sidelying hip abduction 2 x 10  Gait with SPC 226 feet with min G LAQ 10 x 5 second holds  12/11/2022 physical therapy evaluation and HEP    PATIENT EDUCATION:  Education details: 12/27/22: HEP;   EVAL:Patient educated on exam findings, POC, scope of PT, HEP, and further testing next visit; PT recommendation to go to ED per history of frequent falls; notified MD office of falls. Person educated: Patient and daughter Education method: Explanation, Demonstration, and Handouts Education comprehension: verbalized understanding, returned demonstration, verbal cues required,  and tactile cues required  HOME EXERCISE PROGRAM: Access Code: [email or web address] URL: [email or web address]  Date: 12/27/2022 - Supine Bridge  - 1 x daily - 7 x weekly - 3 sets - 10 reps - Active Straight Leg Raise with Quad Set (Mirrored)  - 1 x daily - 7 x weekly - 3 sets - 10 reps - Clamshell  - 1 x daily - 7 x weekly - 2 sets - 15 reps - Sidelying Hip Abduction (Mirrored)  - 1 x daily - 7 x weekly - 2 sets - 10 reps - Seated Long Arc Quad  - 1 x daily - 7 x weekly - 2 sets - 10 reps - 5 second  hold  Date: 01/01/2023 Prepared by: Emeline Gins Exercises - Sit to Stand Without Arm Support  - 2 x daily - 7 x weekly - 1 sets - 10 reps - Standing 3-Way Kick  - 2 x daily - 7 x weekly - 1 sets - 10 reps - 5 sec hold 01/08/23 - Standing Heel Raises  - 2 x daily - 7 x weekly - 1 sets - 10 reps - 5" hold - Mini Squat with Counter Support  - 2 x daily - 7 x weekly - 1 sets - 10 reps - 5" hold  ASSESSMENT:  CLINICAL IMPRESSION: Therapist progressed pt to standing/sitting exercises only with pt complaining that she felt off balance although pt never lost her balance throughout treatment.  Pt HEP updated.  Patient will continue to benefit from physical therapy in order to improve function and reduce impairment.   EVAL: Patient is a 52 y.o. female who was seen today for physical therapy evaluation and treatment for s/p left THA anterior approach per Dr. [name]. Patient with a fall just prior to PT appt today and had to have 2 people assist her up out of the floor. Also states she had multiple falls yesterday.   Patient declines ED visit.  PT called Dr. Greig Right office to notify of falls and left a message with Jewel Baize.  Question possible polypharmacy issue? Patient is quite petite and spoke with patient about the effects of strong medication on a person with small body mass.  We only assessed sit to stand transfer today and will assess further next visit.  Encouraged patient to continue with HEP from  home health and to go to ED if she has any more falls.  Also encouraged patient to use walker as she starts crying saying she wants to wean off cane.  PT explained this is not safe for her currently with her fall history.   Patient demonstrates muscle weakness, reduced ROM, and fascial restrictions which are likely contributing to symptoms of pain and are negatively impacting patient ability to perform ADLs and functional mobility tasks. Patient will benefit from skilled physical therapy services to address these deficits to reduce pain and improve level of function with ADLs and functional mobility tasks.   OBJECTIVE IMPAIRMENTS: Abnormal gait, decreased activity tolerance, decreased balance, decreased coordination, decreased endurance, decreased knowledge of use of DME, decreased mobility, difficulty walking, decreased ROM, decreased strength, decreased safety awareness, hypomobility, increased fascial restrictions, impaired perceived functional ability, impaired flexibility, and pain.   ACTIVITY LIMITATIONS: carrying, lifting, bending, sitting, standing, squatting, sleeping, stairs, transfers, bed mobility, bathing, toileting, dressing, hygiene/grooming, and locomotion level  PARTICIPATION LIMITATIONS: meal prep, cleaning, laundry, driving, shopping, community activity, and yard work  [facility] POTENTIAL: Fair due to frequent fall history  CLINICAL DECISION MAKING: Evolving/moderate complexity  EVALUATION COMPLEXITY: Moderate   GOALS: Goals reviewed with patient? Yes  SHORT TERM GOALS: Target  date: 01/09/2023 patient will be independent with initial HEP Baseline: Goal status: IN PROGRESS  2.  Patient will self report 30% improvement to improve tolerance for functional activity  Baseline:  Goal status: IN PROGRESS    LONG TERM GOALS: Target date: 01/23/2023  Patient will be independent in self management strategies to improve quality of life and functional outcomes.   Baseline:   Goal status: IN PROGRESS  2.  Patient will self report 50% improvement to improve tolerance for functional activity  Baseline:  Goal status: IN PROGRESS  3.  Patient will improve FOTO score to 40  Baseline: 0.53 Goal status: IN PROGRESS  4.  Patient will remain free of falls Baseline:  Goal status: IN PROGRESS  5.  Patient will ambulate with LRAD x 224 ft in 2 min or less to demonstrate improved functional mobility Baseline:  Goal status: IN PROGRESS  6.  Patient will increase left leg MMTs to 4+ to 5/5 without pain to promote return to ambulation community distances with minimal deviation.  Baseline: see above Goal status: IN PROGRESS   PLAN:  PT FREQUENCY: 2x/week  PT DURATION: 4 weeks  PLANNED INTERVENTIONS: Therapeutic exercises, Therapeutic activity, Neuromuscular re-education, Balance training, Gait training, Patient/Family education, Joint manipulation, Joint mobilization, Stair training, Orthotic/Fit training, DME instructions, Aquatic Therapy, Dry Needling, Electrical stimulation, Spinal manipulation, Spinal mobilization, Cryotherapy, Moist heat, Compression bandaging, scar mobilization, Splintting, Taping, Traction, Ultrasound, Ionotophoresis 4mg /ml Dexamethasone, and Manual therapy   PLAN FOR NEXT SESSION: Continue to progress ambulation with LRAD;  Progress standing stability and strength.    3:18 PM, 01/08/23 Virgina Organ, PT CLT [phone]  Ph: [phone]

## 2023-01-08 NOTE — Telephone Encounter (Signed)
Patient states ankle are very swollen and she is tired patient is currently not on fluid pills but is concerned due to her father passing away from CHF  Patient states left leg is swollen from knee to ankle  Right leg just swollen around the ankle  No other symptoms.

## 2023-01-08 NOTE — Telephone Encounter (Signed)
Pt c/o swelling: STAT is pt has developed SOB within 24 hours  How much weight have you gained and in what time span? Not sure  If swelling, where is the swelling located? Last night it was just the left ankle and calf swollen (which has been the case for a few days) but this morning both was swollen around right foot and ankle  Are you currently taking a fluid pill? No  Are you currently SOB? Some but nothing major  Do you have a log of your daily weights (if so, list)? No  Have you gained 3 pounds in a day or 5 pounds in a week? 5 lbs in a week   Have you traveled recently? No

## 2023-01-09 MED ORDER — FUROSEMIDE 20 MG PO TABS: 20.0000 mg | ORAL_TABLET | Freq: Every day | ORAL | 3 refills | Status: DC

## 2023-01-09 NOTE — Addendum Note (Signed)
Addended by: Sharen Hones on: 01/09/2023 03:37 PM   Modules accepted: Orders

## 2023-01-14 ENCOUNTER — Encounter (HOSPITAL_COMMUNITY): Payer: Self-pay | Admitting: Physical Therapy

## 2023-01-14 ENCOUNTER — Ambulatory Visit (HOSPITAL_COMMUNITY): Payer: 59 | Admitting: Physical Therapy

## 2023-01-14 DIAGNOSIS — M25552 Pain in left hip: Secondary | ICD-10-CM | POA: Diagnosis not present

## 2023-01-14 DIAGNOSIS — R262 Difficulty in walking, not elsewhere classified: Secondary | ICD-10-CM

## 2023-01-14 NOTE — Therapy (Signed)
OUTPATIENT PHYSICAL THERAPY TREATMENT   [name] MRN: [id number] DOB:15-Dec-1970, 52 y.o., female Today's Date: 01/14/2023  END OF SESSION:  PT End of Session - 01/08/23 1518     Visit Number 4    Number of Visits 12    Date for PT Re-Evaluation 01/23/23    Authorization Type [employer]    PT Start Time 1438    PT Stop Time 1518    PT Time Calculation (min) 40 min    Activity Tolerance Patient limited by pain    Behavior During Therapy Anxious             Past Medical History:  Diagnosis Date   Bruising, spontaneous 12/08/2013   Cancer (HCC)    cervical cancer - had hysterectomy   Chronic back pain    Chronic back pain    DDD (degenerative disc disease), lumbar    DDD (degenerative disc disease), lumbar    Fibromyalgia    Headache(784.0)    migraines   Ovarian cyst    Partial tear subscapularis tendon 12/27/2011   Scoliosis    Seizures (HCC)    started 9/12-   Past Surgical History:  Procedure Laterality Date   ABDOMINAL HYSTERECTOMY  2012   BACK SURGERY  1989   scoliosis throsic-rods   CESAREAN SECTION     CHOLECYSTECTOMY N/A 11/17/2012   Procedure: LAPAROSCOPIC CHOLECYSTECTOMY WITH INTRAOPERATIVE CHOLANGIOGRAM;  Surgeon: Axel Filler, MD;  Location: [facility];  Service: General;  Laterality: N/A;   COLONOSCOPY WITH PROPOFOL N/A 04/21/2020   Procedure: COLONOSCOPY WITH PROPOFOL;  Surgeon: Jeani Hawking, MD;  Location: [facility];  Service: Endoscopy;  Laterality: N/A;   ENDOSCOPIC RETROGRADE CHOLANGIOPANCREATOGRAPHY (ERCP) WITH PROPOFOL N/A 06/11/2019   Procedure: ENDOSCOPIC RETROGRADE CHOLANGIOPANCREATOGRAPHY (ERCP) WITH PROPOFOL;  Surgeon: Jeani Hawking, MD;  Location: [facility];  Service: Endoscopy;  Laterality: N/A;   ERCP N/A 05/07/2013   Procedure: ENDOSCOPIC RETROGRADE CHOLANGIOPANCREATOGRAPHY (ERCP);  Surgeon: Theda Belfast, MD;  Location: [facility];  Service: Endoscopy;  Laterality: N/A;  start ercp first, if canulation fails switch to  EUS    ESOPHAGOGASTRODUODENOSCOPY (EGD) WITH PROPOFOL N/A 05/15/2019   Procedure: ESOPHAGOGASTRODUODENOSCOPY (EGD) WITH PROPOFOL;  Surgeon: West Bali, MD;  Location: [facility];  Service: Endoscopy;  Laterality: N/A;   ESOPHAGOGASTRODUODENOSCOPY (EGD) WITH PROPOFOL N/A 03/03/2020   Procedure: ESOPHAGOGASTRODUODENOSCOPY (EGD) WITH PROPOFOL;  Surgeon: Jeani Hawking, MD;  Location: [facility];  Service: Endoscopy;  Laterality: N/A;   ESOPHAGOGASTRODUODENOSCOPY (EGD) WITH PROPOFOL N/A 04/21/2020   Procedure: ESOPHAGOGASTRODUODENOSCOPY (EGD) WITH PROPOFOL;  Surgeon: Jeani Hawking, MD;  Location: [facility];  Service: Endoscopy;  Laterality: N/A;   EUS N/A 11/03/2012   Procedure: UPPER ENDOSCOPIC ULTRASOUND (EUS) LINEAR;  Surgeon: Theda Belfast, MD;  Location: [facility];  Service: Endoscopy;  Laterality: N/A;   left arm     NECK SURGERY     POLYPECTOMY  04/21/2020   Procedure: POLYPECTOMY;  Surgeon: Jeani Hawking, MD;  Location: [facility];  Service: Endoscopy;;   REMOVAL OF STONES  06/11/2019   Procedure: REMOVAL OF STONES;  Surgeon: Jeani Hawking, MD;  Location: [facility];  Service: Endoscopy;;   TOOTH EXTRACTION N/A 05/24/2022   Procedure: DENTAL RESTORATION/EXTRACTIONS;  Surgeon: Ocie Doyne, DMD;  Location: [facility];  Service: Oral Surgery;  Laterality: N/A;   TOTAL HIP ARTHROPLASTY Left 11/17/2022   Procedure: TOTAL HIP ARTHROPLASTY ANTERIOR APPROACH;  Surgeon: Sheral Apley, MD;  Location: [facility];  Service: Orthopedics;  Laterality: Left;   TUBAL LIGATION  UPPER ESOPHAGEAL ENDOSCOPIC ULTRASOUND (EUS) N/A 06/11/2019   Procedure: UPPER ESOPHAGEAL ENDOSCOPIC ULTRASOUND (EUS);  Surgeon: Jeani Hawking, MD;  Location: [facility];  Service: Endoscopy;  Laterality: N/A;   Patient Active Problem List   Diagnosis Date Noted   Palpitations 12/05/2022   Transaminitis 11/16/2022   Hip fracture (HCC) 11/15/2022   GERD (gastroesophageal reflux disease) 11/15/2022   Tobacco use  11/15/2022   Idiopathic hypotension 07/19/2022   Slurred speech 07/19/2022   Underweight 07/19/2022   Hypotension 07/19/2022   Seizure disorder (HCC) 07/18/2022   Chronic migraine w/o aura w/o status migrainosus, not intractable 07/18/2022   Cerebral cavernoma 07/18/2022   Acute GI bleed due to bleeding pyloric/gastric ulcer 05/15/2019   PUD (peptic ulcer disease)-EGD 05/15/2019 with bleeding pyloric/gastric ulcer, nonbleeding duodenal ulcers and gastritis    Hematemesis with nausea    Migraine headache 09/25/2015   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   Bilateral occipital neuralgia 01/02/2015   Migraine 01/02/2015   Chest pain of uncertain etiology 11/29/2014   DDD (degenerative disc disease), cervical 10/27/2014   DDD (degenerative disc disease), 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   DDD (degenerative disc disease), lumbosacral 10/27/2014   Hypokalemia 05/07/2013   Partial tear subscapularis tendon 12/27/2011    PCP: Elfredia Nevins  REFERRING PROVIDER: Sheral Apley, MD  REFERRING DIAG: [phone] (ICD-10-CM) - Status post left hip replacement  THERAPY DIAG:  Pain in left hip  Difficulty in walking, not elsewhere classified  Rationale for Evaluation and Treatment: Rehabilitation  ONSET DATE: 11/17/22  SUBJECTIVE:   SUBJECTIVE STATEMENT: Pt states that she was doing well but woke up with swelling in both of her ankles, she has called her MD and her cardiologist.  Pt comes to the department walking with a cane.     EVAL: 11/12/22 left hip fracture (did not go to hospital until 11/15/22)  so then 11/17/22 had THA per Dr. [name].  3 days in hospital after surgery; anterior approach.   Prior to surgery did not use AD. Arrives with daughter in [facility];  states she fell this morning just prior to PT appt. ; also states she fell multiple times yesterday.  before surgery had multiple falls.  using a RW since surgery.   Patient is very weepy; anxious from fall this morning.  Bent to pick  up a pill on the floor and fell in hallway today.  Dentures are loose and speech is slurred; daughter states she is slightly more slurred than at baseline.    PERTINENT HISTORY: Chronic pain; takes multiple medications Chronic migraine Seizure fibromyalgia PAIN:  Are you having pain? Yes: NPRS scale: 7/10 Pain location: hip and back Pain description: aches, sore Aggravating factors: fell this morning Relieving factors: heat, meds  PRECAUTIONS: Fall  WEIGHT BEARING RESTRICTIONS: No  FALLS:  Has patient fallen in last 6 months? Yes. Number of falls 10 plus  LIVING ENVIRONMENT: Lives with: lives with their family Lives in: House/apartment Stairs: Yes: External: 5 steps; on left going up Has following equipment at home: Dan Humphreys - 2 wheeled and Wheelchair (manual)  OCCUPATION: retired  PLOF: Independent with household mobility with device  PATIENT GOALS: get rid of the walker  NEXT MD VISIT: 01/01/23  OBJECTIVE:   Steri strips remain left anterior hip; sits in wc with left knee valgus; left hip adduction  DIAGNOSTIC FINDINGS:  CLINICAL DATA:  Elective surgery. Intraoperative fluoroscopy for total left hip arthroplasty.   EXAM: DG HIP (WITH OR WITHOUT PELVIS) 1V*L*   COMPARISON:  Pelvis and  left hip radiographs 11/15/2022   FINDINGS: Images were performed intraoperatively without the presence of a radiologist. New total left hip arthroplasty. No hardware complication is seen.   Total fluoroscopy images: 2   Total fluoroscopy time: 21 seconds   Total dose: Radiation Exposure Index (as provided by the fluoroscopic device): 2.01 mGy air Kerma   Please see intraoperative findings for further detail.   IMPRESSION: Intraoperative fluoroscopy for total left hip arthroplasty.   PATIENT SURVEYS:  FOTO 0.54  COGNITION: Overall cognitive status: Impaired Per daughter's report    SENSATION: Not tested  EDEMA:  No significant swelling noted  POSTURE: rounded  shoulders, forward head, flexed trunk , weight shift right, and flexed knees  PALPATION: General soreness left hip but no severe pain with touch  LLOWER EXTREMITY MMT:  MMT Right eval Left eval  Hip flexion  3-  Hip extension    Hip abduction    Hip adduction    Hip internal rotation    Hip external rotation    Knee flexion    Knee extension  2+  Ankle dorsiflexion  4  Ankle plantarflexion    Ankle inversion    Ankle eversion     (Blank rows = not tested)   FUNCTIONAL TESTS:  30 seconds chair stand test x 1 to RW (CGA for safety)  GAIT: Distance walked: 0 Assistive device utilized: Wheelchair (manual) left walker in the car Level of assistance: CGA Comments: unable to fully extend knees   TODAY'S TREATMENT:                                                                                                                              DATE:  01/14/23 Standing HR 1 x 10 Standing TR 1 x 10 Standing alternating march 2 x 10 bilateral  Step up 6 inch 2 x 10 LLE Lateral step up 6 inch 2 x 10 LLE Rockerboard lateral 2 x 1 minute, A/P 2 x 1 minute Resisted lateral stepping 2 plates 1 x 5 bilateral  STS  1x10  01/08/23 Standing Heel raise x 10 Toe raise x 10  Functional squat x 10 Sit to stand x 10  Marching x 10 Tandem stance  x 5  Step up 4" step x 10 B  Standing hamstring stretch with foot on 12" stool x 1 minute B x 2  Side step at // x 2 RT  Gait with no AD   01/01/23 Gait with SPC 226 feet with Supervision, no LOB Standing: tandem 2X30" each LE lead  Alternating high hold march with 1 UE assist 10X  12/27/22 Bridge 2 x 10  SLR 2 x 10  Sidelying clam 2 x 15 Sidelying hip abduction 2 x 10  Gait with SPC 226 feet with min G LAQ 10 x 5 second holds  12/11/2022 physical therapy evaluation and HEP    PATIENT EDUCATION:  Education details: 12/27/22: HEP;   EVAL:Patient educated on exam  findings, POC, scope of PT, HEP, and further testing next visit; PT  recommendation to go to ED per history of frequent falls; notified MD office of falls. Person educated: Patient and daughter Education method: Explanation, Demonstration, and Handouts Education comprehension: verbalized understanding, returned demonstration, verbal cues required, and tactile cues required  HOME EXERCISE PROGRAM: Access Code: [email or web address] URL: [email or web address]  Date: 12/27/2022 - Supine Bridge  - 1 x daily - 7 x weekly - 3 sets - 10 reps - Active Straight Leg Raise with Quad Set (Mirrored)  - 1 x daily - 7 x weekly - 3 sets - 10 reps - Clamshell  - 1 x daily - 7 x weekly - 2 sets - 15 reps - Sidelying Hip Abduction (Mirrored)  - 1 x daily - 7 x weekly - 2 sets - 10 reps - Seated Long Arc Quad  - 1 x daily - 7 x weekly - 2 sets - 10 reps - 5 second  hold  Date: 01/01/2023 Prepared by: Emeline Gins Exercises - Sit to Stand Without Arm Support  - 2 x daily - 7 x weekly - 1 sets - 10 reps - Standing 3-Way Kick  - 2 x daily - 7 x weekly - 1 sets - 10 reps - 5 sec hold 01/08/23 - Standing Heel Raises  - 2 x daily - 7 x weekly - 1 sets - 10 reps - 5" hold - Mini Squat with Counter Support  - 2 x daily - 7 x weekly - 1 sets - 10 reps - 5" hold  ASSESSMENT:  CLINICAL IMPRESSION: Continued with LE strengthening which is tolerated well. Patient demonstrating improving gait mechanics and balance with stance on L. Tolerates additional step height but some dynamic knee valgus due to glute weakness, improves some with cueing. Mild unsteadiness with 1 LOB during lateral stepping exercise. Overall, tolerates session well and progressing well. Patient will continue to benefit from physical therapy in order to improve function and reduce impairment.   EVAL: Patient is a 52 y.o. female who was seen today for physical therapy evaluation and treatment for s/p left THA anterior approach per Dr. [name]. Patient with a fall just prior to PT appt today and had to have 2 people assist  her up out of the floor. Also states she had multiple falls yesterday.   Patient declines ED visit.  PT called Dr. Greig Right office to notify of falls and left a message with Jewel Baize.  Question possible polypharmacy issue? Patient is quite petite and spoke with patient about the effects of strong medication on a person with small body mass.  We only assessed sit to stand transfer today and will assess further next visit.  Encouraged patient to continue with HEP from home health and to go to ED if she has any more falls.  Also encouraged patient to use walker as she starts crying saying she wants to wean off cane.  PT explained this is not safe for her currently with her fall history.   Patient demonstrates muscle weakness, reduced ROM, and fascial restrictions which are likely contributing to symptoms of pain and are negatively impacting patient ability to perform ADLs and functional mobility tasks. Patient will benefit from skilled physical therapy services to address these deficits to reduce pain and improve level of function with ADLs and functional mobility tasks.   OBJECTIVE IMPAIRMENTS: Abnormal gait, decreased activity tolerance, decreased balance, decreased coordination, decreased endurance, decreased knowledge of use of DME, decreased mobility, difficulty walking, decreased  ROM, decreased strength, decreased safety awareness, hypomobility, increased fascial restrictions, impaired perceived functional ability, impaired flexibility, and pain.   ACTIVITY LIMITATIONS: carrying, lifting, bending, sitting, standing, squatting, sleeping, stairs, transfers, bed mobility, bathing, toileting, dressing, hygiene/grooming, and locomotion level  PARTICIPATION LIMITATIONS: meal prep, cleaning, laundry, driving, shopping, community activity, and yard work  [facility] POTENTIAL: Fair due to frequent fall history  CLINICAL DECISION MAKING: Evolving/moderate complexity  EVALUATION COMPLEXITY: Moderate   GOALS: Goals  reviewed with patient? Yes  SHORT TERM GOALS: Target date: 01/09/2023 patient will be independent with initial HEP Baseline: Goal status: IN PROGRESS  2.  Patient will self report 30% improvement to improve tolerance for functional activity  Baseline:  Goal status: IN PROGRESS    LONG TERM GOALS: Target date: 01/23/2023  Patient will be independent in self management strategies to improve quality of life and functional outcomes.   Baseline:  Goal status: IN PROGRESS  2.  Patient will self report 50% improvement to improve tolerance for functional activity  Baseline:  Goal status: IN PROGRESS  3.  Patient will improve FOTO score to 40  Baseline: 0.53 Goal status: IN PROGRESS  4.  Patient will remain free of falls Baseline:  Goal status: IN PROGRESS  5.  Patient will ambulate with LRAD x 224 ft in 2 min or less to demonstrate improved functional mobility Baseline:  Goal status: IN PROGRESS  6.  Patient will increase left leg MMTs to 4+ to 5/5 without pain to promote return to ambulation community distances with minimal deviation.  Baseline: see above Goal status: IN PROGRESS   PLAN:  PT FREQUENCY: 2x/week  PT DURATION: 4 weeks  PLANNED INTERVENTIONS: Therapeutic exercises, Therapeutic activity, Neuromuscular re-education, Balance training, Gait training, Patient/Family education, Joint manipulation, Joint mobilization, Stair training, Orthotic/Fit training, DME instructions, Aquatic Therapy, Dry Needling, Electrical stimulation, Spinal manipulation, Spinal mobilization, Cryotherapy, Moist heat, Compression bandaging, scar mobilization, Splintting, Taping, Traction, Ultrasound, Ionotophoresis 4mg /ml Dexamethasone, and Manual therapy   PLAN FOR NEXT SESSION: Continue to progress ambulation with LRAD;  Progress standing stability and strength.    11:22 AM, 01/14/23 Wyman Songster PT, DPT Physical Therapist at [facility]

## 2023-01-16 ENCOUNTER — Ambulatory Visit: Payer: 59 | Admitting: Adult Health

## 2023-01-16 ENCOUNTER — Ambulatory Visit (HOSPITAL_COMMUNITY): Payer: 59

## 2023-01-16 DIAGNOSIS — R262 Difficulty in walking, not elsewhere classified: Secondary | ICD-10-CM

## 2023-01-16 DIAGNOSIS — M25552 Pain in left hip: Secondary | ICD-10-CM

## 2023-01-16 NOTE — Therapy (Signed)
OUTPATIENT PHYSICAL THERAPY TREATMENT   [name] MRN: [id number] DOB:1971-06-17, 52 y.o., female Today's Date: 01/16/2023  END OF SESSION:  PT End of Session - 01/08/23 1518     Visit Number 4    Number of Visits 12    Date for PT Re-Evaluation 01/23/23    Authorization Type [employer]    PT Start Time 1438    PT Stop Time 1518    PT Time Calculation (min) 40 min    Activity Tolerance Patient limited by pain    Behavior During Therapy Anxious             Past Medical History:  Diagnosis Date   Bruising, spontaneous 12/08/2013   Cancer (HCC)    cervical cancer - had hysterectomy   Chronic back pain    Chronic back pain    DDD (degenerative disc disease), lumbar    DDD (degenerative disc disease), lumbar    Fibromyalgia    Headache(784.0)    migraines   Ovarian cyst    Partial tear subscapularis tendon 12/27/2011   Scoliosis    Seizures (HCC)    started 9/12-   Past Surgical History:  Procedure Laterality Date   ABDOMINAL HYSTERECTOMY  2012   BACK SURGERY  1989   scoliosis throsic-rods   CESAREAN SECTION     CHOLECYSTECTOMY N/A 11/17/2012   Procedure: LAPAROSCOPIC CHOLECYSTECTOMY WITH INTRAOPERATIVE CHOLANGIOGRAM;  Surgeon: Axel Filler, MD;  Location: [facility];  Service: General;  Laterality: N/A;   COLONOSCOPY WITH PROPOFOL N/A 04/21/2020   Procedure: COLONOSCOPY WITH PROPOFOL;  Surgeon: Jeani Hawking, MD;  Location: [facility];  Service: Endoscopy;  Laterality: N/A;   ENDOSCOPIC RETROGRADE CHOLANGIOPANCREATOGRAPHY (ERCP) WITH PROPOFOL N/A 06/11/2019   Procedure: ENDOSCOPIC RETROGRADE CHOLANGIOPANCREATOGRAPHY (ERCP) WITH PROPOFOL;  Surgeon: Jeani Hawking, MD;  Location: [facility];  Service: Endoscopy;  Laterality: N/A;   ERCP N/A 05/07/2013   Procedure: ENDOSCOPIC RETROGRADE CHOLANGIOPANCREATOGRAPHY (ERCP);  Surgeon: Theda Belfast, MD;  Location: [facility];  Service: Endoscopy;  Laterality: N/A;  start ercp first, if canulation fails switch to  EUS    ESOPHAGOGASTRODUODENOSCOPY (EGD) WITH PROPOFOL N/A 05/15/2019   Procedure: ESOPHAGOGASTRODUODENOSCOPY (EGD) WITH PROPOFOL;  Surgeon: West Bali, MD;  Location: [facility];  Service: Endoscopy;  Laterality: N/A;   ESOPHAGOGASTRODUODENOSCOPY (EGD) WITH PROPOFOL N/A 03/03/2020   Procedure: ESOPHAGOGASTRODUODENOSCOPY (EGD) WITH PROPOFOL;  Surgeon: Jeani Hawking, MD;  Location: [facility];  Service: Endoscopy;  Laterality: N/A;   ESOPHAGOGASTRODUODENOSCOPY (EGD) WITH PROPOFOL N/A 04/21/2020   Procedure: ESOPHAGOGASTRODUODENOSCOPY (EGD) WITH PROPOFOL;  Surgeon: Jeani Hawking, MD;  Location: [facility];  Service: Endoscopy;  Laterality: N/A;   EUS N/A 11/03/2012   Procedure: UPPER ENDOSCOPIC ULTRASOUND (EUS) LINEAR;  Surgeon: Theda Belfast, MD;  Location: [facility];  Service: Endoscopy;  Laterality: N/A;   left arm     NECK SURGERY     POLYPECTOMY  04/21/2020   Procedure: POLYPECTOMY;  Surgeon: Jeani Hawking, MD;  Location: [facility];  Service: Endoscopy;;   REMOVAL OF STONES  06/11/2019   Procedure: REMOVAL OF STONES;  Surgeon: Jeani Hawking, MD;  Location: [facility];  Service: Endoscopy;;   TOOTH EXTRACTION N/A 05/24/2022   Procedure: DENTAL RESTORATION/EXTRACTIONS;  Surgeon: Ocie Doyne, DMD;  Location: [facility];  Service: Oral Surgery;  Laterality: N/A;   TOTAL HIP ARTHROPLASTY Left 11/17/2022   Procedure: TOTAL HIP ARTHROPLASTY ANTERIOR APPROACH;  Surgeon: Sheral Apley, MD;  Location: [facility];  Service: Orthopedics;  Laterality: Left;   TUBAL LIGATION  UPPER ESOPHAGEAL ENDOSCOPIC ULTRASOUND (EUS) N/A 06/11/2019   Procedure: UPPER ESOPHAGEAL ENDOSCOPIC ULTRASOUND (EUS);  Surgeon: Jeani Hawking, MD;  Location: [facility];  Service: Endoscopy;  Laterality: N/A;   Patient Active Problem List   Diagnosis Date Noted   Palpitations 12/05/2022   Transaminitis 11/16/2022   Hip fracture (HCC) 11/15/2022   GERD (gastroesophageal reflux disease) 11/15/2022   Tobacco use  11/15/2022   Idiopathic hypotension 07/19/2022   Slurred speech 07/19/2022   Underweight 07/19/2022   Hypotension 07/19/2022   Seizure disorder (HCC) 07/18/2022   Chronic migraine w/o aura w/o status migrainosus, not intractable 07/18/2022   Cerebral cavernoma 07/18/2022   Acute GI bleed due to bleeding pyloric/gastric ulcer 05/15/2019   PUD (peptic ulcer disease)-EGD 05/15/2019 with bleeding pyloric/gastric ulcer, nonbleeding duodenal ulcers and gastritis    Hematemesis with nausea    Migraine headache 09/25/2015   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   Bilateral occipital neuralgia 01/02/2015   Migraine 01/02/2015   Chest pain of uncertain etiology 11/29/2014   DDD (degenerative disc disease), cervical 10/27/2014   DDD (degenerative disc disease), 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   DDD (degenerative disc disease), lumbosacral 10/27/2014   Hypokalemia 05/07/2013   Partial tear subscapularis tendon 12/27/2011    PCP: Elfredia Nevins  REFERRING PROVIDER: Sheral Apley, MD  REFERRING DIAG: [phone] (ICD-10-CM) - Status post left hip replacement  THERAPY DIAG:  Pain in left hip  Difficulty in walking, not elsewhere classified  Rationale for Evaluation and Treatment: Rehabilitation  ONSET DATE: 11/17/22  SUBJECTIVE:   SUBJECTIVE STATEMENT: Patient sees her neurologist later today for dizziness; had a migraine yesterday but that is gone today.  She is trying to walk some at home without her cane.    EVAL: 11/12/22 left hip fracture (did not go to hospital until 11/15/22)  so then 11/17/22 had THA per Dr. [name].  3 days in hospital after surgery; anterior approach.   Prior to surgery did not use AD. Arrives with daughter in [facility];  states she fell this morning just prior to PT appt. ; also states she fell multiple times yesterday.  before surgery had multiple falls.  using a RW since surgery.   Patient is very weepy; anxious from fall this morning.  Bent to pick up a pill on the  floor and fell in hallway today.  Dentures are loose and speech is slurred; daughter states she is slightly more slurred than at baseline.    PERTINENT HISTORY: Chronic pain; takes multiple medications Chronic migraine Seizure fibromyalgia PAIN:  Are you having pain? Yes: NPRS scale: 7/10 Pain location: hip and back Pain description: aches, sore Aggravating factors: fell this morning Relieving factors: heat, meds  PRECAUTIONS: Fall  WEIGHT BEARING RESTRICTIONS: No  FALLS:  Has patient fallen in last 6 months? Yes. Number of falls 10 plus  LIVING ENVIRONMENT: Lives with: lives with their family Lives in: House/apartment Stairs: Yes: External: 5 steps; on left going up Has following equipment at home: Dan Humphreys - 2 wheeled and Wheelchair (manual)  OCCUPATION: retired  PLOF: Independent with household mobility with device  PATIENT GOALS: get rid of the walker  NEXT MD VISIT: 01/01/23  OBJECTIVE:   Steri strips remain left anterior hip; sits in wc with left knee valgus; left hip adduction  DIAGNOSTIC FINDINGS:  CLINICAL DATA:  Elective surgery. Intraoperative fluoroscopy for total left hip arthroplasty.   EXAM: DG HIP (WITH OR WITHOUT PELVIS) 1V*L*   COMPARISON:  Pelvis and left hip radiographs 11/15/2022   FINDINGS:  Images were performed intraoperatively without the presence of a radiologist. New total left hip arthroplasty. No hardware complication is seen.   Total fluoroscopy images: 2   Total fluoroscopy time: 21 seconds   Total dose: Radiation Exposure Index (as provided by the fluoroscopic device): 2.01 mGy air Kerma   Please see intraoperative findings for further detail.   IMPRESSION: Intraoperative fluoroscopy for total left hip arthroplasty.   PATIENT SURVEYS:  FOTO 0.54  COGNITION: Overall cognitive status: Impaired Per daughter's report    SENSATION: Not tested  EDEMA:  No significant swelling noted  POSTURE: rounded shoulders, forward  head, flexed trunk , weight shift right, and flexed knees  PALPATION: General soreness left hip but no severe pain with touch  LLOWER EXTREMITY MMT:  MMT Right eval Left eval  Hip flexion  3-  Hip extension    Hip abduction    Hip adduction    Hip internal rotation    Hip external rotation    Knee flexion    Knee extension  2+  Ankle dorsiflexion  4  Ankle plantarflexion    Ankle inversion    Ankle eversion     (Blank rows = not tested)   FUNCTIONAL TESTS:  30 seconds chair stand test x 1 to RW (CGA for safety)  GAIT: Distance walked: 0 Assistive device utilized: Wheelchair (manual) left walker in the car Level of assistance: CGA Comments: unable to fully extend knees   TODAY'S TREATMENT:                                                                                                                              DATE:  01/16/23 Nustep seat 5 x 5' dynamic warm up  STS 2 x 10 no UE assist from mat table Standing: Sidestepping along side mat table with RTB around knees down and back x 5 Heel raises x 20 Toe raises x 20 7" step ups 2 x 10 7" lateral step ups  Tandem stance no UE assist x 30" each way  Seated 2# LAQ's 3 x 10   01/14/23 Standing HR 1 x 10 Standing TR 1 x 10 Standing alternating march 2 x 10 bilateral  Step up 6 inch 2 x 10 LLE Lateral step up 6 inch 2 x 10 LLE Rockerboard lateral 2 x 1 minute, A/P 2 x 1 minute Resisted lateral stepping 2 plates 1 x 5 bilateral  STS  1x10  01/08/23 Standing Heel raise x 10 Toe raise x 10  Functional squat x 10 Sit to stand x 10  Marching x 10 Tandem stance  x 5  Step up 4" step x 10 B  Standing hamstring stretch with foot on 12" stool x 1 minute B x 2  Side step at // x 2 RT  Gait with no AD   01/01/23 Gait with SPC 226 feet with Supervision, no LOB Standing: tandem 2X30" each LE lead  Alternating high hold march with  1 UE assist 10X  12/27/22 Bridge 2 x 10  SLR 2 x 10  Sidelying clam 2 x  15 Sidelying hip abduction 2 x 10  Gait with SPC 226 feet with min G LAQ 10 x 5 second holds  12/11/2022 physical therapy evaluation and HEP    PATIENT EDUCATION:  Education details: 12/27/22: HEP;   EVAL:Patient educated on exam findings, POC, scope of PT, HEP, and further testing next visit; PT recommendation to go to ED per history of frequent falls; notified MD office of falls. Person educated: Patient and daughter Education method: Explanation, Demonstration, and Handouts Education comprehension: verbalized understanding, returned demonstration, verbal cues required, and tactile cues required  HOME EXERCISE PROGRAM: Access Code: [email or web address] URL: [email or web address]  Date: 12/27/2022 - Supine Bridge  - 1 x daily - 7 x weekly - 3 sets - 10 reps - Active Straight Leg Raise with Quad Set (Mirrored)  - 1 x daily - 7 x weekly - 3 sets - 10 reps - Clamshell  - 1 x daily - 7 x weekly - 2 sets - 15 reps - Sidelying Hip Abduction (Mirrored)  - 1 x daily - 7 x weekly - 2 sets - 10 reps - Seated Long Arc Quad  - 1 x daily - 7 x weekly - 2 sets - 10 reps - 5 second  hold  Date: 01/01/2023 Prepared by: Emeline Gins Exercises - Sit to Stand Without Arm Support  - 2 x daily - 7 x weekly - 1 sets - 10 reps - Standing 3-Way Kick  - 2 x daily - 7 x weekly - 1 sets - 10 reps - 5 sec hold 01/08/23 - Standing Heel Raises  - 2 x daily - 7 x weekly - 1 sets - 10 reps - 5" hold - Mini Squat with Counter Support  - 2 x daily - 7 x weekly - 1 sets - 10 reps - 5" hold  ASSESSMENT:  CLINICAL IMPRESSION: began Nustep x 5' for dynamic warm up today. Sit to stand with no need to use UE to assist.   Noted slight decreased left knee extension with LAQ's and with step ups.  Increased step height to 7" today.  Able to sidestep without any upper extremity assist.  Max challenge with tandem stance with left foot behind.  Overall, tolerates session well and progressing well. Patient will continue to  benefit from physical therapy in order to improve function and reduce impairment.   EVAL: Patient is a 52 y.o. female who was seen today for physical therapy evaluation and treatment for s/p left THA anterior approach per Dr. [name]. Patient with a fall just prior to PT appt today and had to have 2 people assist her up out of the floor. Also states she had multiple falls yesterday.   Patient declines ED visit.  PT called Dr. Greig Right office to notify of falls and left a message with Jewel Baize.  Question possible polypharmacy issue? Patient is quite petite and spoke with patient about the effects of strong medication on a person with small body mass.  We only assessed sit to stand transfer today and will assess further next visit.  Encouraged patient to continue with HEP from home health and to go to ED if she has any more falls.  Also encouraged patient to use walker as she starts crying saying she wants to wean off cane.  PT explained this is not safe for her currently with her fall history.   Patient  demonstrates muscle weakness, reduced ROM, and fascial restrictions which are likely contributing to symptoms of pain and are negatively impacting patient ability to perform ADLs and functional mobility tasks. Patient will benefit from skilled physical therapy services to address these deficits to reduce pain and improve level of function with ADLs and functional mobility tasks.   OBJECTIVE IMPAIRMENTS: Abnormal gait, decreased activity tolerance, decreased balance, decreased coordination, decreased endurance, decreased knowledge of use of DME, decreased mobility, difficulty walking, decreased ROM, decreased strength, decreased safety awareness, hypomobility, increased fascial restrictions, impaired perceived functional ability, impaired flexibility, and pain.   ACTIVITY LIMITATIONS: carrying, lifting, bending, sitting, standing, squatting, sleeping, stairs, transfers, bed mobility, bathing, toileting, dressing,  hygiene/grooming, and locomotion level  PARTICIPATION LIMITATIONS: meal prep, cleaning, laundry, driving, shopping, community activity, and yard work  [facility] POTENTIAL: Fair due to frequent fall history  CLINICAL DECISION MAKING: Evolving/moderate complexity  EVALUATION COMPLEXITY: Moderate   GOALS: Goals reviewed with patient? Yes  SHORT TERM GOALS: Target date: 01/09/2023 patient will be independent with initial HEP Baseline: Goal status: IN PROGRESS  2.  Patient will self report 30% improvement to improve tolerance for functional activity  Baseline:  Goal status: IN PROGRESS    LONG TERM GOALS: Target date: 01/23/2023  Patient will be independent in self management strategies to improve quality of life and functional outcomes.   Baseline:  Goal status: IN PROGRESS  2.  Patient will self report 50% improvement to improve tolerance for functional activity  Baseline:  Goal status: IN PROGRESS  3.  Patient will improve FOTO score to 40  Baseline: 0.53 Goal status: IN PROGRESS  4.  Patient will remain free of falls Baseline:  Goal status: IN PROGRESS  5.  Patient will ambulate with LRAD x 224 ft in 2 min or less to demonstrate improved functional mobility Baseline:  Goal status: IN PROGRESS  6.  Patient will increase left leg MMTs to 4+ to 5/5 without pain to promote return to ambulation community distances with minimal deviation.  Baseline: see above Goal status: IN PROGRESS   PLAN:  PT FREQUENCY: 2x/week  PT DURATION: 4 weeks  PLANNED INTERVENTIONS: Therapeutic exercises, Therapeutic activity, Neuromuscular re-education, Balance training, Gait training, Patient/Family education, Joint manipulation, Joint mobilization, Stair training, Orthotic/Fit training, DME instructions, Aquatic Therapy, Dry Needling, Electrical stimulation, Spinal manipulation, Spinal mobilization, Cryotherapy, Moist heat, Compression bandaging, scar mobilization, Splintting, Taping,  Traction, Ultrasound, Ionotophoresis 4mg /ml Dexamethasone, and Manual therapy   PLAN FOR NEXT SESSION: Continue to progress ambulation with LRAD;  Progress standing stability and strength.      11:54 AM, 01/16/23 Fumie Fiallo Small Kenni Newton MPT [facility] [place] [phone]

## 2023-01-20 ENCOUNTER — Ambulatory Visit (HOSPITAL_COMMUNITY): Payer: 59 | Admitting: Physical Therapy

## 2023-01-20 DIAGNOSIS — M25552 Pain in left hip: Secondary | ICD-10-CM

## 2023-01-20 DIAGNOSIS — R262 Difficulty in walking, not elsewhere classified: Secondary | ICD-10-CM

## 2023-01-20 NOTE — Therapy (Signed)
OUTPATIENT PHYSICAL THERAPY TREATMENT   [name] MRN: [id number] DOB:[birth year], 52 y.o., female Today's Date: 01/20/2023  END OF SESSION:   PT End of Session - 01/20/23 1434     Visit Number 7    Number of Visits 12    Date for PT Re-Evaluation 01/23/23    Authorization Type [employer]    PT Start Time 1434    PT Stop Time 1515    PT Time Calculation (min) 41 min    Activity Tolerance Patient tolerated treatment well    Behavior During Therapy [facility] for tasks assessed/performed                 Past Medical History:  Diagnosis Date   Bruising, spontaneous 12/08/2013   Cancer (HCC)    cervical cancer - had hysterectomy   Chronic back pain    Chronic back pain    DDD (degenerative disc disease), lumbar    DDD (degenerative disc disease), lumbar    Fibromyalgia    Headache(784.0)    migraines   Ovarian cyst    Partial tear subscapularis tendon 12/27/2011   Scoliosis    Seizures (HCC)    started 9/12-   Past Surgical History:  Procedure Laterality Date   ABDOMINAL HYSTERECTOMY  2012   BACK SURGERY  1989   scoliosis throsic-rods   CESAREAN SECTION     CHOLECYSTECTOMY N/A 11/17/2012   Procedure: LAPAROSCOPIC CHOLECYSTECTOMY WITH INTRAOPERATIVE CHOLANGIOGRAM;  Surgeon: Axel Filler, MD;  Location: [facility] OR;  Service: General;  Laterality: N/A;   COLONOSCOPY WITH PROPOFOL N/A 04/21/2020   Procedure: COLONOSCOPY WITH PROPOFOL;  Surgeon: Jeani Hawking, MD;  Location: [facility];  Service: Endoscopy;  Laterality: N/A;   ENDOSCOPIC RETROGRADE CHOLANGIOPANCREATOGRAPHY (ERCP) WITH PROPOFOL N/A 06/11/2019   Procedure: ENDOSCOPIC RETROGRADE CHOLANGIOPANCREATOGRAPHY (ERCP) WITH PROPOFOL;  Surgeon: Jeani Hawking, MD;  Location: [facility];  Service: Endoscopy;  Laterality: N/A;   ERCP N/A 05/07/2013   Procedure: ENDOSCOPIC RETROGRADE CHOLANGIOPANCREATOGRAPHY (ERCP);  Surgeon: Theda Belfast, MD;  Location: [facility];  Service: Endoscopy;  Laterality: N/A;  start  ercp first, if canulation fails switch to EUS    ESOPHAGOGASTRODUODENOSCOPY (EGD) WITH PROPOFOL N/A 05/15/2019   Procedure: ESOPHAGOGASTRODUODENOSCOPY (EGD) WITH PROPOFOL;  Surgeon: West Bali, MD;  [facility]: [facility];  Service: Endoscopy;  Laterality: N/A;   ESOPHAGOGASTRODUODENOSCOPY (EGD) WITH PROPOFOL N/A 03/03/2020   Procedure: ESOPHAGOGASTRODUODENOSCOPY (EGD) WITH PROPOFOL;  Surgeon: Jeani Hawking, MD;  Location: [facility];  Service: Endoscopy;  Laterality: N/A;   ESOPHAGOGASTRODUODENOSCOPY (EGD) WITH PROPOFOL N/A 04/21/2020   Procedure: ESOPHAGOGASTRODUODENOSCOPY (EGD) WITH PROPOFOL;  Surgeon: Jeani Hawking, MD;  Location: [facility];  Service: Endoscopy;  Laterality: N/A;   EUS N/A 11/03/2012   Procedure: UPPER ENDOSCOPIC ULTRASOUND (EUS) LINEAR;  Surgeon: Theda Belfast, MD;  Location: [facility];  Service: Endoscopy;  Laterality: N/A;   left arm     NECK SURGERY     POLYPECTOMY  04/21/2020   Procedure: POLYPECTOMY;  Surgeon: Jeani Hawking, MD;  Location: [facility];  Service: Endoscopy;;   REMOVAL OF STONES  06/11/2019   Procedure: REMOVAL OF STONES;  Surgeon: Jeani Hawking, MD;  Location: [facility];  Service: Endoscopy;;   TOOTH EXTRACTION N/A 05/24/2022   Procedure: DENTAL RESTORATION/EXTRACTIONS;  Surgeon: Ocie Doyne, DMD;  Location: [facility];  Service: Oral Surgery;  Laterality: N/A;   TOTAL HIP ARTHROPLASTY Left 11/17/2022   Procedure: TOTAL HIP ARTHROPLASTY ANTERIOR APPROACH;  Surgeon: Sheral Apley, MD;  Location: [facility] OR;  Service:  Orthopedics;  Laterality: Left;   TUBAL LIGATION     UPPER ESOPHAGEAL ENDOSCOPIC ULTRASOUND (EUS) N/A 06/11/2019   Procedure: UPPER ESOPHAGEAL ENDOSCOPIC ULTRASOUND (EUS);  Surgeon: Jeani Hawking, MD;  Location: [facility];  Service: Endoscopy;  Laterality: N/A;   Patient Active Problem List   Diagnosis Date Noted   Palpitations 12/05/2022   Transaminitis 11/16/2022   Hip fracture (HCC) 11/15/2022   GERD (gastroesophageal  reflux disease) 11/15/2022   Tobacco use 11/15/2022   Idiopathic hypotension 07/19/2022   Slurred speech 07/19/2022   Underweight 07/19/2022   Hypotension 07/19/2022   Seizure disorder (HCC) 07/18/2022   Chronic migraine w/o aura w/o status migrainosus, not intractable 07/18/2022   Cerebral cavernoma 07/18/2022   Acute GI bleed due to bleeding pyloric/gastric ulcer 05/15/2019   PUD (peptic ulcer disease)-EGD 05/15/2019 with bleeding pyloric/gastric ulcer, nonbleeding duodenal ulcers and gastritis    Hematemesis with nausea    Migraine headache 09/25/2015   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   Bilateral occipital neuralgia 01/02/2015   Migraine 01/02/2015   Chest pain of uncertain etiology 11/29/2014   DDD (degenerative disc disease), cervical 10/27/2014   DDD (degenerative disc disease), 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   DDD (degenerative disc disease), lumbosacral 10/27/2014   Hypokalemia 05/07/2013   Partial tear subscapularis tendon 12/27/2011    PCP: Elfredia Nevins  REFERRING PROVIDER: Sheral Apley, MD  REFERRING DIAG: [phone] (ICD-10-CM) - Status post left hip replacement  THERAPY DIAG:  Pain in left hip  Difficulty in walking, not elsewhere classified  Rationale for Evaluation and Treatment: Rehabilitation  ONSET DATE: 11/17/22  SUBJECTIVE:   SUBJECTIVE STATEMENT: Patient states she's been walking around the house some at home without her AD. States her knees ache sometimes.    EVAL: 11/12/22 left hip fracture (did not go to hospital until 11/15/22)  so then 11/17/22 had THA per Dr. [name].  3 days in hospital after surgery; anterior approach.   Prior to surgery did not use AD. Arrives with daughter in [facility];  states she fell this morning just prior to PT appt. ; also states she fell multiple times yesterday.  before surgery had multiple falls.  using a RW since surgery.   Patient is very weepy; anxious from fall this morning.  Bent to pick up a pill on the floor  and fell in hallway today.  Dentures are loose and speech is slurred; daughter states she is slightly more slurred than at baseline.    PERTINENT HISTORY: Chronic pain; takes multiple medications Chronic migraine Seizure fibromyalgia PAIN:  Are you having pain? Yes: NPRS scale: 7/10 Pain location: hip and back Pain description: aches, sore Aggravating factors: fell this morning Relieving factors: heat, meds  PRECAUTIONS: Fall  WEIGHT BEARING RESTRICTIONS: No  FALLS:  Has patient fallen in last 6 months? Yes. Number of falls 10 plus  LIVING ENVIRONMENT: Lives with: lives with their family Lives in: House/apartment Stairs: Yes: External: 5 steps; on left going up Has following equipment at home: Dan Humphreys - 2 wheeled and Wheelchair (manual)  OCCUPATION: retired  PLOF: Independent with household mobility with device  PATIENT GOALS: get rid of the walker  NEXT MD VISIT: 01/01/23  OBJECTIVE:   Steri strips remain left anterior hip; sits in wc with left knee valgus; left hip adduction  DIAGNOSTIC FINDINGS:  CLINICAL DATA:  Elective surgery. Intraoperative fluoroscopy for total left hip arthroplasty.   EXAM: DG HIP (WITH OR WITHOUT PELVIS) 1V*L*   COMPARISON:  Pelvis and left hip radiographs 11/15/2022  FINDINGS: Images were performed intraoperatively without the presence of a radiologist. New total left hip arthroplasty. No hardware complication is seen.   Total fluoroscopy images: 2   Total fluoroscopy time: 21 seconds   Total dose: Radiation Exposure Index (as provided by the fluoroscopic device): 2.01 mGy air Kerma   Please see intraoperative findings for further detail.   IMPRESSION: Intraoperative fluoroscopy for total left hip arthroplasty.   PATIENT SURVEYS:  FOTO 0.54  COGNITION: Overall cognitive status: Impaired Per daughter's report    SENSATION: Not tested  EDEMA:  No significant swelling noted  POSTURE: rounded shoulders, forward head,  flexed trunk , weight shift right, and flexed knees  PALPATION: General soreness left hip but no severe pain with touch  LLOWER EXTREMITY MMT:  MMT Right eval Left eval  Hip flexion  3-  Hip extension    Hip abduction    Hip adduction    Hip internal rotation    Hip external rotation    Knee flexion    Knee extension  2+  Ankle dorsiflexion  4  Ankle plantarflexion    Ankle inversion    Ankle eversion     (Blank rows = not tested)   FUNCTIONAL TESTS:  30 seconds chair stand test x 1 to RW (CGA for safety)  GAIT: Distance walked: 0 Assistive device utilized: Wheelchair (manual) left walker in the car Level of assistance: CGA Comments: unable to fully extend knees   TODAY'S TREATMENT:                                                                                                                              DATE:  01/20/23 Nustep seat 5 x 5' dynamic warm up Standing: Heel raises x 20 Toe raises x 20 7" step ups 2 x 10 1 UE 7" lateral step ups 2X10 1 UE SLS no UE max of 14"Rt, 12"Lt 3X Tandem stance no UE assist x 30" each way,  Sidestepping along side mat table with RTB around knees down and back x 5  Vectors, 3 way kick 2X5 reps with 5" holds each 1 UE each  01/16/23 Nustep seat 5 x 5' dynamic warm up  STS 2 x 10 no UE assist from mat table Standing: Sidestepping along side mat table with RTB around knees down and back x 5 Heel raises x 20 Toe raises x 20 7" step ups 2 x 10 7" lateral step ups  Tandem stance no UE assist x 30" each way  Seated 2# LAQ's 3 x 10   01/14/23 Standing HR 1 x 10 Standing TR 1 x 10 Standing alternating march 2 x 10 bilateral  Step up 6 inch 2 x 10 LLE Lateral step up 6 inch 2 x 10 LLE Rockerboard lateral 2 x 1 minute, A/P 2 x 1 minute Resisted lateral stepping 2 plates 1 x 5 bilateral  STS  1x10  01/08/23 Standing Heel raise x 10 Toe raise  x 10  Functional squat x 10 Sit to stand x 10  Marching x 10 Tandem stance  x  5  Step up 4" step x 10 B  Standing hamstring stretch with foot on 12" stool x 1 minute B x 2  Side step at // x 2 RT  Gait with no AD   01/01/23 Gait with SPC 226 feet with Supervision, no LOB Standing: tandem 2X30" each LE lead  Alternating high hold march with 1 UE assist 10X  12/27/22 Bridge 2 x 10  SLR 2 x 10  Sidelying clam 2 x 15 Sidelying hip abduction 2 x 10  Gait with SPC 226 feet with min G LAQ 10 x 5 second holds  12/11/2022 physical therapy evaluation and HEP    PATIENT EDUCATION:  Education details: 12/27/22: HEP;   EVAL:Patient educated on exam findings, POC, scope of PT, HEP, and further testing next visit; PT recommendation to go to ED per history of frequent falls; notified MD office of falls. Person educated: Patient and daughter Education method: Explanation, Demonstration, and Handouts Education comprehension: verbalized understanding, returned demonstration, verbal cues required, and tactile cues required  HOME EXERCISE PROGRAM: Access Code: [email or web address] URL: [email or web address]  Date: 12/27/2022 - Supine Bridge  - 1 x daily - 7 x weekly - 3 sets - 10 reps - Active Straight Leg Raise with Quad Set (Mirrored)  - 1 x daily - 7 x weekly - 3 sets - 10 reps - Clamshell  - 1 x daily - 7 x weekly - 2 sets - 15 reps - Sidelying Hip Abduction (Mirrored)  - 1 x daily - 7 x weekly - 2 sets - 10 reps - Seated Long Arc Quad  - 1 x daily - 7 x weekly - 2 sets - 10 reps - 5 second  hold  Date: 01/01/2023 Prepared by: Emeline Gins Exercises - Sit to Stand Without Arm Support  - 2 x daily - 7 x weekly - 1 sets - 10 reps - Standing 3-Way Kick  - 2 x daily - 7 x weekly - 1 sets - 10 reps - 5 sec hold 01/08/23 - Standing Heel Raises  - 2 x daily - 7 x weekly - 1 sets - 10 reps - 5" hold - Mini Squat with Counter Support  - 2 x daily - 7 x weekly - 1 sets - 10 reps - 5" hold  ASSESSMENT:  CLINICAL IMPRESSION:  Continued focus on improving LE functional  strength and stability.  Pt able to complete full 30" hold without UE with Lt leading vs max of 15" initially with Rt leading.  Reduced UE assist with most exercises today.  Resumed 3 way kick to increase glute strength for balance/stability. Increased step height to 7" today.  Overall, tolerates session well and progressing well. Patient will continue to benefit from physical therapy in order to improve function and reduce impairment.   EVAL: Patient is a 51 y.o. female who was seen today for physical therapy evaluation and treatment for s/p left THA anterior approach per Dr. [name]. Patient with a fall just prior to PT appt today and had to have 2 people assist her up out of the floor. Also states she had multiple falls yesterday.   Patient declines ED visit.  PT called Dr. Greig Right office to notify of falls and left a message with Jewel Baize.  Question possible polypharmacy issue? Patient is quite petite and spoke with patient about the effects of strong medication on  a person with small body mass.  We only assessed sit to stand transfer today and will assess further next visit.  Encouraged patient to continue with HEP from home health and to go to ED if she has any more falls.  Also encouraged patient to use walker as she starts crying saying she wants to wean off cane.  PT explained this is not safe for her currently with her fall history.   Patient demonstrates muscle weakness, reduced ROM, and fascial restrictions which are likely contributing to symptoms of pain and are negatively impacting patient ability to perform ADLs and functional mobility tasks. Patient will benefit from skilled physical therapy services to address these deficits to reduce pain and improve level of function with ADLs and functional mobility tasks.   OBJECTIVE IMPAIRMENTS: Abnormal gait, decreased activity tolerance, decreased balance, decreased coordination, decreased endurance, decreased knowledge of use of DME, decreased mobility,  difficulty walking, decreased ROM, decreased strength, decreased safety awareness, hypomobility, increased fascial restrictions, impaired perceived functional ability, impaired flexibility, and pain.   ACTIVITY LIMITATIONS: carrying, lifting, bending, sitting, standing, squatting, sleeping, stairs, transfers, bed mobility, bathing, toileting, dressing, hygiene/grooming, and locomotion level  PARTICIPATION LIMITATIONS: meal prep, cleaning, laundry, driving, shopping, community activity, and yard work  [facility] POTENTIAL: Fair due to frequent fall history  CLINICAL DECISION MAKING: Evolving/moderate complexity  EVALUATION COMPLEXITY: Moderate   GOALS: Goals reviewed with patient? Yes  SHORT TERM GOALS: Target date: 01/09/2023 patient will be independent with initial HEP Baseline: Goal status: IN PROGRESS  2.  Patient will self report 30% improvement to improve tolerance for functional activity  Baseline:  Goal status: IN PROGRESS    LONG TERM GOALS: Target date: 01/23/2023  Patient will be independent in self management strategies to improve quality of life and functional outcomes.   Baseline:  Goal status: IN PROGRESS  2.  Patient will self report 50% improvement to improve tolerance for functional activity  Baseline:  Goal status: IN PROGRESS  3.  Patient will improve FOTO score to 40  Baseline: 0.53 Goal status: IN PROGRESS  4.  Patient will remain free of falls Baseline:  Goal status: IN PROGRESS  5.  Patient will ambulate with LRAD x 224 ft in 2 min or less to demonstrate improved functional mobility Baseline:  Goal status: IN PROGRESS  6.  Patient will increase left leg MMTs to 4+ to 5/5 without pain to promote return to ambulation community distances with minimal deviation.  Baseline: see above Goal status: IN PROGRESS   PLAN:  PT FREQUENCY: 2x/week  PT DURATION: 4 weeks  PLANNED INTERVENTIONS: Therapeutic exercises, Therapeutic activity,  Neuromuscular re-education, Balance training, Gait training, Patient/Family education, Joint manipulation, Joint mobilization, Stair training, Orthotic/Fit training, DME instructions, Aquatic Therapy, Dry Needling, Electrical stimulation, Spinal manipulation, Spinal mobilization, Cryotherapy, Moist heat, Compression bandaging, scar mobilization, Splintting, Taping, Traction, Ultrasound, Ionotophoresis 4mg /ml Dexamethasone, and Manual therapy   PLAN FOR NEXT SESSION: Continue to progress ambulation with LRAD;  Progress standing stability and strength.      3:16 PM, 01/20/23 Lurena Nida, PTA/CLT [facility] Ph: [phone]

## 2023-01-21 ENCOUNTER — Ambulatory Visit: Payer: 59 | Attending: Internal Medicine

## 2023-01-21 DIAGNOSIS — I5043 Acute on chronic combined systolic (congestive) and diastolic (congestive) heart failure: Secondary | ICD-10-CM | POA: Diagnosis not present

## 2023-01-22 ENCOUNTER — Encounter (HOSPITAL_COMMUNITY): Payer: Self-pay | Admitting: Physical Therapy

## 2023-01-22 ENCOUNTER — Ambulatory Visit (HOSPITAL_COMMUNITY): Payer: 59 | Admitting: Physical Therapy

## 2023-01-22 DIAGNOSIS — R262 Difficulty in walking, not elsewhere classified: Secondary | ICD-10-CM

## 2023-01-22 DIAGNOSIS — M25552 Pain in left hip: Secondary | ICD-10-CM | POA: Diagnosis not present

## 2023-01-22 NOTE — Therapy (Signed)
OUTPATIENT PHYSICAL THERAPY TREATMENT   Patient Name: MILIAH WEILER MRN: [id number] DOB:Jul 01, 1970, 52 y.o., female Today's Date: 01/22/2023  PHYSICAL THERAPY DISCHARGE SUMMARY  Visits from Start of Care: 8  Current functional level related to goals / functional outcomes: See below   Remaining deficits: See below   Education / Equipment: See below   Patient agrees to discharge. Patient goals were met. Patient is being discharged due to meeting the stated rehab goals.   END OF SESSION:   PT End of Session - 01/22/23 1115     Visit Number 8    Number of Visits 12    Date for PT Re-Evaluation 01/23/23    Authorization Type [employer]    PT Start Time 1116    PT Stop Time 1152    PT Time Calculation (min) 36 min    Activity Tolerance Patient tolerated treatment well    Behavior During Therapy WFL for tasks assessed/performed                 Past Medical History:  Diagnosis Date   Bruising, spontaneous 12/08/2013   Cancer (HCC)    cervical cancer - had hysterectomy   Chronic back pain    Chronic back pain    DDD (degenerative disc disease), lumbar    DDD (degenerative disc disease), lumbar    Fibromyalgia    Headache(784.0)    migraines   Ovarian cyst    Partial tear subscapularis tendon 12/27/2011   Scoliosis    Seizures (HCC)    started 9/12-   Past Surgical History:  Procedure Laterality Date   ABDOMINAL HYSTERECTOMY  2012   BACK SURGERY  1989   scoliosis throsic-rods   CESAREAN SECTION     CHOLECYSTECTOMY N/A 11/17/2012   Procedure: LAPAROSCOPIC CHOLECYSTECTOMY WITH INTRAOPERATIVE CHOLANGIOGRAM;  Surgeon: Axel Filler, MD;  Location: [facility];  Service: General;  Laterality: N/A;   COLONOSCOPY WITH PROPOFOL N/A 04/21/2020   Procedure: COLONOSCOPY WITH PROPOFOL;  Surgeon: Jeani Hawking, MD;  Location: [facility];  Service: Endoscopy;  Laterality: N/A;   ENDOSCOPIC RETROGRADE CHOLANGIOPANCREATOGRAPHY (ERCP) WITH PROPOFOL N/A 06/11/2019   Procedure:  ENDOSCOPIC RETROGRADE CHOLANGIOPANCREATOGRAPHY (ERCP) WITH PROPOFOL;  Surgeon: Jeani Hawking, MD;  Location: [facility];  Service: Endoscopy;  Laterality: N/A;   ERCP N/A 05/07/2013   Procedure: ENDOSCOPIC RETROGRADE CHOLANGIOPANCREATOGRAPHY (ERCP);  Surgeon: Theda Belfast, MD;  Location: [facility];  Service: Endoscopy;  Laterality: N/A;  start ercp first, if canulation fails switch to EUS    ESOPHAGOGASTRODUODENOSCOPY (EGD) WITH PROPOFOL N/A 05/15/2019   Procedure: ESOPHAGOGASTRODUODENOSCOPY (EGD) WITH PROPOFOL;  Surgeon: West Bali, MD;  Location: [facility];  Service: Endoscopy;  Laterality: N/A;   ESOPHAGOGASTRODUODENOSCOPY (EGD) WITH PROPOFOL N/A 03/03/2020   Procedure: ESOPHAGOGASTRODUODENOSCOPY (EGD) WITH PROPOFOL;  Surgeon: Jeani Hawking, MD;  Location: [facility];  Service: Endoscopy;  Laterality: N/A;   ESOPHAGOGASTRODUODENOSCOPY (EGD) WITH PROPOFOL N/A 04/21/2020   Procedure: ESOPHAGOGASTRODUODENOSCOPY (EGD) WITH PROPOFOL;  Surgeon: Jeani Hawking, MD;  Location: [facility];  Service: Endoscopy;  Laterality: N/A;   EUS N/A 11/03/2012   Procedure: UPPER ENDOSCOPIC ULTRASOUND (EUS) LINEAR;  Surgeon: Theda Belfast, MD;  Location: [facility];  Service: Endoscopy;  Laterality: N/A;   left arm     NECK SURGERY     POLYPECTOMY  04/21/2020   Procedure: POLYPECTOMY;  Surgeon: Jeani Hawking, MD;  Location: [facility];  Service: Endoscopy;;   REMOVAL OF STONES  06/11/2019   Procedure: REMOVAL OF STONES;  Surgeon: Jeani Hawking, MD;  Location:  [facility];  Service: Endoscopy;;   TOOTH EXTRACTION N/A 05/24/2022   Procedure: DENTAL RESTORATION/EXTRACTIONS;  Surgeon: Ocie Doyne, DMD;  Location: [facility];  Service: Oral Surgery;  Laterality: N/A;   TOTAL HIP ARTHROPLASTY Left 11/17/2022   Procedure: TOTAL HIP ARTHROPLASTY ANTERIOR APPROACH;  Surgeon: Sheral Apley, MD;  Location: [facility];  Service: Orthopedics;  Laterality: Left;   TUBAL LIGATION     UPPER ESOPHAGEAL ENDOSCOPIC  ULTRASOUND (EUS) N/A 06/11/2019   Procedure: UPPER ESOPHAGEAL ENDOSCOPIC ULTRASOUND (EUS);  Surgeon: Jeani Hawking, MD;  Location: [facility];  Service: Endoscopy;  Laterality: N/A;   Patient Active Problem List   Diagnosis Date Noted   Palpitations 12/05/2022   Transaminitis 11/16/2022   Hip fracture (HCC) 11/15/2022   GERD (gastroesophageal reflux disease) 11/15/2022   Tobacco use 11/15/2022   Idiopathic hypotension 07/19/2022   Slurred speech 07/19/2022   Underweight 07/19/2022   Hypotension 07/19/2022   Seizure disorder (HCC) 07/18/2022   Chronic migraine w/o aura w/o status migrainosus, not intractable 07/18/2022   Cerebral cavernoma 07/18/2022   Acute GI bleed due to bleeding pyloric/gastric ulcer 05/15/2019   PUD (peptic ulcer disease)-EGD 05/15/2019 with bleeding pyloric/gastric ulcer, nonbleeding duodenal ulcers and gastritis    Hematemesis with nausea    Migraine headache 09/25/2015   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   Bilateral occipital neuralgia 01/02/2015   Migraine 01/02/2015   Chest pain of uncertain etiology 11/29/2014   DDD (degenerative disc disease), cervical 10/27/2014   DDD (degenerative disc disease), 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   DDD (degenerative disc disease), lumbosacral 10/27/2014   Hypokalemia 05/07/2013   Partial tear subscapularis tendon 12/27/2011    PCP: Elfredia Nevins  REFERRING PROVIDER: Sheral Apley, MD  REFERRING DIAG: [phone] (ICD-10-CM) - Status post left hip replacement  THERAPY DIAG:  No diagnosis found.  Rationale for Evaluation and Treatment: Rehabilitation  ONSET DATE: 11/17/22  SUBJECTIVE:   SUBJECTIVE STATEMENT: Patient states HEP going well. Doing exercises in the pool. Tried doing the stairs at home. Patient states 95% improvement with PT intervention. Feels that it is more fear rather than ability limiting her. Afraid of falling. Feels that her strength is building up.    EVAL: 11/12/22 left hip  fracture (did not go to hospital until 11/15/22)  so then 11/17/22 had THA per Dr. [name].  3 days in hospital after surgery; anterior approach.   Prior to surgery did not use AD. Arrives with daughter in [facility];  states she fell this morning just prior to PT appt. ; also states she fell multiple times yesterday.  before surgery had multiple falls.  using a RW since surgery.   Patient is very weepy; anxious from fall this morning.  Bent to pick up a pill on the floor and fell in hallway today.  Dentures are loose and speech is slurred; daughter states she is slightly more slurred than at baseline.    PERTINENT HISTORY: Chronic pain; takes multiple medications Chronic migraine Seizure fibromyalgia PAIN:  Are you having pain? Yes: NPRS scale: 4/10 Pain location: hip and back Pain description: aches, sore Aggravating factors: fell this morning Relieving factors: heat, meds  PRECAUTIONS: Fall  WEIGHT BEARING RESTRICTIONS: No  FALLS:  Has patient fallen in last 6 months? Yes. Number of falls 10 plus  LIVING ENVIRONMENT: Lives with: lives with their family Lives in: House/apartment Stairs: Yes: External: 5 steps; on left going up Has following equipment at home: Dan Humphreys - 2 wheeled and Wheelchair (manual)  OCCUPATION: retired  PLOF:  Independent with household mobility with device  PATIENT GOALS: get rid of the walker  NEXT MD VISIT: 01/01/23  OBJECTIVE:   Steri strips remain left anterior hip; sits in wc with left knee valgus; left hip adduction  DIAGNOSTIC FINDINGS:  CLINICAL DATA:  Elective surgery. Intraoperative fluoroscopy for total left hip arthroplasty.   EXAM: DG HIP (WITH OR WITHOUT PELVIS) 1V*L*   COMPARISON:  Pelvis and left hip radiographs 11/15/2022   FINDINGS: Images were performed intraoperatively without the presence of a radiologist. New total left hip arthroplasty. No hardware complication is seen.   Total fluoroscopy images: 2   Total fluoroscopy time: 21  seconds   Total dose: Radiation Exposure Index (as provided by the fluoroscopic device): 2.01 mGy air Kerma   Please see intraoperative findings for further detail.   IMPRESSION: Intraoperative fluoroscopy for total left hip arthroplasty.   PATIENT SURVEYS:  FOTO 0.54   01/22/23 59% function  COGNITION: Overall cognitive status: Impaired Per daughter's report    SENSATION: Not tested  EDEMA:  No significant swelling noted  POSTURE: rounded shoulders, forward head, flexed trunk , weight shift right, and flexed knees  PALPATION: General soreness left hip but no severe pain with touch  LLOWER EXTREMITY MMT:  MMT Right eval Left eval Right 01/22/23 Left 01/22/23  Hip flexion  3- 4+ 4+  Hip extension      Hip abduction      Hip adduction      Hip internal rotation      Hip external rotation      Knee flexion      Knee extension  2+ 4+ 4+  Ankle dorsiflexion  4 4+ 4+  Ankle plantarflexion      Ankle inversion      Ankle eversion       (Blank rows = not tested)   FUNCTIONAL TESTS:  30 seconds chair stand test x 1 to RW (CGA for safety)  GAIT: Distance walked: 0 Assistive device utilized: Wheelchair (manual) left walker in the car Level of assistance: CGA Comments: unable to fully extend knees   TODAY'S TREATMENT:                                                                                                                              DATE:  01/22/23 Nustep seat 5 x 5' dynamic warm up  Reassessment : 305 feet without AD Stairs 7 inch alternating pattern, decreased eccentric strength and motor control on L with descending  01/20/23 Nustep seat 5 x 5' dynamic warm up Standing: Heel raises x 20 Toe raises x 20 7" step ups 2 x 10 1 UE 7" lateral step ups 2X10 1 UE SLS no UE max of 14"Rt, 12"Lt 3X Tandem stance no UE assist x 30" each way,  Sidestepping along side mat table with RTB around knees down and back x 5  Vectors, 3 way kick 2X5 reps with 5"  holds each 1 UE  each  01/16/23 Nustep seat 5 x 5' dynamic warm up  STS 2 x 10 no UE assist from mat table Standing: Sidestepping along side mat table with RTB around knees down and back x 5 Heel raises x 20 Toe raises x 20 7" step ups 2 x 10 7" lateral step ups  Tandem stance no UE assist x 30" each way  Seated 2# LAQ's 3 x 10   01/14/23 Standing HR 1 x 10 Standing TR 1 x 10 Standing alternating march 2 x 10 bilateral  Step up 6 inch 2 x 10 LLE Lateral step up 6 inch 2 x 10 LLE Rockerboard lateral 2 x 1 minute, A/P 2 x 1 minute Resisted lateral stepping 2 plates 1 x 5 bilateral  STS  1x10  01/08/23 Standing Heel raise x 10 Toe raise x 10  Functional squat x 10 Sit to stand x 10  Marching x 10 Tandem stance  x 5  Step up 4" step x 10 B  Standing hamstring stretch with foot on 12" stool x 1 minute B x 2  Side step at // x 2 RT  Gait with no AD   01/01/23 Gait with SPC 226 feet with Supervision, no LOB Standing: tandem 2X30" each LE lead  Alternating high hold march with 1 UE assist 10X  12/27/22 Bridge 2 x 10  SLR 2 x 10  Sidelying clam 2 x 15 Sidelying hip abduction 2 x 10  Gait with SPC 226 feet with min G LAQ 10 x 5 second holds  12/11/2022 physical therapy evaluation and HEP    PATIENT EDUCATION:  Education details: 01/22/23: reassessment findings, HEP, returning to PT if needed; 12/27/22: HEP;   EVAL:Patient educated on exam findings, POC, scope of PT, HEP, and further testing next visit; PT recommendation to go to ED per history of frequent falls; notified MD office of falls. Person educated: Patient and daughter Education method: Explanation, Demonstration, and Handouts Education comprehension: verbalized understanding, returned demonstration, verbal cues required, and tactile cues required  HOME EXERCISE PROGRAM: Access Code: [email or web address] URL: [email or web address]  Date: 12/27/2022 - Supine Bridge  - 1 x daily - 7 x weekly - 3 sets - 10  reps - Active Straight Leg Raise with Quad Set (Mirrored)  - 1 x daily - 7 x weekly - 3 sets - 10 reps - Clamshell  - 1 x daily - 7 x weekly - 2 sets - 15 reps - Sidelying Hip Abduction (Mirrored)  - 1 x daily - 7 x weekly - 2 sets - 10 reps - Seated Long Arc Quad  - 1 x daily - 7 x weekly - 2 sets - 10 reps - 5 second  hold  Date: 01/01/2023 Prepared by: Emeline Gins Exercises - Sit to Stand Without Arm Support  - 2 x daily - 7 x weekly - 1 sets - 10 reps - Standing 3-Way Kick  - 2 x daily - 7 x weekly - 1 sets - 10 reps - 5 sec hold 01/08/23 - Standing Heel Raises  - 2 x daily - 7 x weekly - 1 sets - 10 reps - 5" hold - Mini Squat with Counter Support  - 2 x daily - 7 x weekly - 1 sets - 10 reps - 5" hold  01/22/23 - Step Up  - 1 x daily - 7 x weekly - 2 sets - 10 reps - Lateral Step Up  - 1 x daily - 7 x weekly -  2 sets - 10 reps  ASSESSMENT:  CLINICAL IMPRESSION:  Patient has met 2/2 short term goals and 6/6 long term goals with ability to complete HEP and improvement in symptoms, strength, ROM, activity tolerance, gait, balance, and functional mobility. Patient ambulates with mild unsteadiness without AD with BLE in valgus alignment but without loss of balance. Patient feels ready to d/c to HEP as her mobility is better now compared to pre hip fracture. Patient educated on reassessment findings and returning to PT if needed. Patient discharged from PT at this time.    EVAL: Patient is a 52 y.o. female who was seen today for physical therapy evaluation and treatment for s/p left THA anterior approach per Dr. [name]. Patient with a fall just prior to PT appt today and had to have 2 people assist her up out of the floor. Also states she had multiple falls yesterday.   Patient declines ED visit.  PT called Dr. Greig Right office to notify of falls and left a message with Jewel Baize.  Question possible polypharmacy issue? Patient is quite petite and spoke with patient about the effects of strong  medication on a person with small body mass.  We only assessed sit to stand transfer today and will assess further next visit.  Encouraged patient to continue with HEP from home health and to go to ED if she has any more falls.  Also encouraged patient to use walker as she starts crying saying she wants to wean off cane.  PT explained this is not safe for her currently with her fall history.   Patient demonstrates muscle weakness, reduced ROM, and fascial restrictions which are likely contributing to symptoms of pain and are negatively impacting patient ability to perform ADLs and functional mobility tasks. Patient will benefit from skilled physical therapy services to address these deficits to reduce pain and improve level of function with ADLs and functional mobility tasks.   OBJECTIVE IMPAIRMENTS: Abnormal gait, decreased activity tolerance, decreased balance, decreased coordination, decreased endurance, decreased knowledge of use of DME, decreased mobility, difficulty walking, decreased ROM, decreased strength, decreased safety awareness, hypomobility, increased fascial restrictions, impaired perceived functional ability, impaired flexibility, and pain.   ACTIVITY LIMITATIONS: carrying, lifting, bending, sitting, standing, squatting, sleeping, stairs, transfers, bed mobility, bathing, toileting, dressing, hygiene/grooming, and locomotion level  PARTICIPATION LIMITATIONS: meal prep, cleaning, laundry, driving, shopping, community activity, and yard work  [facility] POTENTIAL: Fair due to frequent fall history  CLINICAL DECISION MAKING: Evolving/moderate complexity  EVALUATION COMPLEXITY: Moderate   GOALS: Goals reviewed with patient? Yes  SHORT TERM GOALS: Target date: 01/09/2023 patient will be independent with initial HEP Baseline: Goal status: MET  2.  Patient will self report 30% improvement to improve tolerance for functional activity  Baseline:  Goal status: MET    LONG TERM GOALS:  Target date: 01/23/2023  Patient will be independent in self management strategies to improve quality of life and functional outcomes.   Baseline:  Goal status: MET  2.  Patient will self report 50% improvement to improve tolerance for functional activity  Baseline:  Goal status: MET  3.  Patient will improve FOTO score to 40  Baseline: 0.53 01/22/23 59 %function Goal status: MET  4.  Patient will remain free of falls Baseline:  Goal status: MET  5.  Patient will ambulate with LRAD x 224 ft in 2 min or less to demonstrate improved functional mobility Baseline:  305 feet in Goal status: MET  6.  Patient will increase left leg  MMTs to 4+ to 5/5 without pain to promote return to ambulation community distances with minimal deviation.  Baseline: see above Goal status: MET   PLAN:  PT FREQUENCY: 2x/week  PT DURATION: 4 weeks  PLANNED INTERVENTIONS: Therapeutic exercises, Therapeutic activity, Neuromuscular re-education, Balance training, Gait training, Patient/Family education, Joint manipulation, Joint mobilization, Stair training, Orthotic/Fit training, DME instructions, Aquatic Therapy, Dry Needling, Electrical stimulation, Spinal manipulation, Spinal mobilization, Cryotherapy, Moist heat, Compression bandaging, scar mobilization, Splintting, Taping, Traction, Ultrasound, Ionotophoresis 4mg /ml Dexamethasone, and Manual therapy   PLAN FOR NEXT SESSION: n/a      11:55 AM, 01/22/23 Wyman Songster PT, DPT Physical Therapist at [facility]

## 2023-03-14 ENCOUNTER — Encounter: Payer: Self-pay | Admitting: Internal Medicine

## 2023-03-14 DIAGNOSIS — R600 Localized edema: Secondary | ICD-10-CM

## 2023-03-14 DIAGNOSIS — Z79899 Other long term (current) drug therapy: Secondary | ICD-10-CM

## 2023-03-14 DIAGNOSIS — I5043 Acute on chronic combined systolic (congestive) and diastolic (congestive) heart failure: Secondary | ICD-10-CM

## 2023-03-19 ENCOUNTER — Encounter: Payer: Self-pay | Admitting: Adult Health

## 2023-03-19 ENCOUNTER — Ambulatory Visit (INDEPENDENT_AMBULATORY_CARE_PROVIDER_SITE_OTHER): Payer: 59 | Admitting: Adult Health

## 2023-03-19 VITALS — BP 171/88 | HR 71 | Ht 60.0 in | Wt 101.4 lb

## 2023-03-19 DIAGNOSIS — Z5181 Encounter for therapeutic drug level monitoring: Secondary | ICD-10-CM

## 2023-03-19 DIAGNOSIS — G40909 Epilepsy, unspecified, not intractable, without status epilepticus: Secondary | ICD-10-CM | POA: Diagnosis not present

## 2023-03-19 DIAGNOSIS — G43709 Chronic migraine without aura, not intractable, without status migrainosus: Secondary | ICD-10-CM | POA: Diagnosis not present

## 2023-03-19 MED ORDER — LAMOTRIGINE 150 MG PO TABS: 150.0000 mg | ORAL_TABLET | Freq: Two times a day (BID) | ORAL | 5 refills | Status: DC

## 2023-03-19 NOTE — Progress Notes (Signed)
Chief Complaint  Patient presents with   Follow-up    Patient in room #3 and alone. Patient states she been having headaches and had two seizure since the last visit. Patient she keeps falling and broke her hip last month.      ASSESSMENT AND PLAN  Sharon Welch is a 52 y.o. 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 Chronic migraine headaches  Recurrent confusion, staring spells, with falls; has improved on lamotrigine, differentiation diagnosis include complex partial seizure versus opiates overuse, medication side effect  MRI of the brain showed stable left frontal cavernous angioma  EEG was normal, 72 hours EEG normal  Increase lamotrigine to 150mg  BID for recurrent seizures  No longer on Depakote  Advised to call with any recurrent seizure activity  Migraines may improve on higher lamotrigine dosage, suspect recent worsening due to weather changes, continue rizatriptan as needed for rescue  Multiple electrolytes abnormality, low potassium, low calcium, low albumin and total protein at recent emergency visit, can contributed to her complaints as well, repeat laboratory evaluations     Follow-up in 6 months or call earlier if needed     DIAGNOSTIC DATA (LABS, IMAGING, TESTING) - I reviewed patient records, labs, notes, testing and imaging myself where available. Personally reviewed MRI of the brain with and without contrast from 2012, left temporoparietal region chronic hemorrhage, likely related to chronic hemorrhage from cavernoma  MRI of the brain showed no significant abnormality  Laboratory evaluation in December 2023, CBC showed normal hemoglobin of 13.7, mild elevation of WBC 11, normal BMP  72-hour EEG normal     MEDICAL HISTORY:  Update 03/19/2023 JM: Returns for follow-up visit unaccompanied (husband waiting in lobby).  Hospitalization in May after a fall down a flight of stairs secondary  to seizure with left femoral neck fracture s/p total hip arthroplasty. Per note, no longer taking Depakote, only taking Lamictal although she was supposed to be taking both.  Continued on lamotrigine 100 mg twice daily, no further seizures during hospitalization. D/cd home after 4 day stay with [facility] therapies.   ED eval 6/23 for functional decline with vomiting and decreased activity. Sent home from recent admission on muscle relaxants, sedated some pain medications.  Concern for polypharmacy vs dehydration.  Provided IV fluids and Zofran.  Lab work showed electrolyte disturbance and mild dehydration.  Discussed nursing home placement but patient declined.  Reports 1 additional seizure 1 week ago, thankfully without injury.  Reports compliance on lamotrigine 100 mg twice daily, denies side effects. 72 hr EEG normal, no seizures or epileptiform discharges and no events seen during this recording.  She no longer drives.    Has been having migraines more frequently over the past week, use of rizatriptan with benefit at times     History provided for reference purposes only UPDATE Oct 29 2022 Dr. Terrace Arabia: She is with her daughter at visit, her seizure-like spell overall has much improved, taking Depakote ER 500 mg 2 tablets every night, level was 39  She complains of bilateral hands tremor, intermittent dizziness, weakness, legs gave out under her  She still report seizure activity 2 to 3 times a month, blank stares, sometimes came out of it body shaking, confused for a while afterwards.  Personally reviewed MRI of the brain with without contrast August 02, 2022, chronic chemo product in the patent consistent with cerebral cavernous angioma in the posterior left subcortical frontal lobe, stable compared to  previous MRI in November 2015.  Hospital admission from January 25 to 27, 2024, for confusion, slurred speech, dizziness, headaches, was diagnosed with idiopathic hypotension, history of peptic ulcer  disease,  [place] presentation September 20, 2022, not feeling well, episode of feeling shaky, body heaviness, especially both legs   Laboratory evaluations in March 2024: Normal inactive magnesium, HIV, B12, CMP, showed low total protein and albumin, calcium was 7.6,  UDS was positive for opiates,    Consult visit 07/18/2022 Dr. Terrace Arabia: Sharon Welch, is a 52 year old female, accompanied by her daughter seen in request by her primary care from [facility] medical doctor Elfredia Nevins, for evaluation of seizure, initial evaluation was on July 18, 2022  I reviewed and summarized the referring note. PMHX Chronic migraine,  GERD, history of gastric ulcer. Chronic insomnia Chronic constipation Fibromyogia Chronic migraine Cervical Cancer, s/p hysterectomy Smoke   She was under the care of by Dr. Gerilyn Pilgrim, who has closed his practice, she was given the diagnosis of seizure, chronic migraine headaches  Patient reported history of migraine since 52 years old, lateralized severe pounding headache with light noise sensitivity  Following her gastric ulcer procedure in 2010, she also developed seizure, sometimes has aura of strange smell, followed by loss of consciousness, but sometimes she went into generalized tonic-clonic seizure  Last generalized seizure without warning signs was in December 2023, at nighttime she was sitting watching TV, woke up had a significant left side retro-orbital area headache, more intense than her usual migraine headaches, also has brain fog, confusion, lasting for few hours  Yesterday July 17, 2022, she also had an episode of sudden onset vertigo, confusion, time laps,  Daughter also reported she has zoning out spells, couple times each months, occasionally with finger fidgety, blinking  Personally reviewed MRI of the brain in 2012, evidence of left parietotemporal region cavernoma, hemorrhage, no mass effect,  She has been treated with Topamax 100 mg twice a day,  worried about her weight loss, continue to have migraine headache once or twice each week,  Has been on disability for many years due to history of scoliosis, Harrington rod placement, quit driving around 1610,       PHYSICAL EXAM:   Vitals:   03/19/23 1456  BP: (!) 171/88  Pulse: 71  Weight: 101 lb 6.4 oz (46 kg)  Height: 5' (1.524 m)   Body mass index is 19.8 kg/m.  PHYSICAL EXAMNIATION:  Gen: NAD, conversant, frail appearing older than stated age, very pleasant middle-age Caucasian female          Cardiovascular: Regular rate rhythm, no peripheral edema, warm, nontender. Eyes: Conjunctivae clear without exudates or hemorrhage Neck: Supple, no carotid bruits. Pulmonary: Clear to auscultation bilaterally   NEUROLOGICAL EXAM:  MENTAL STATUS: Speech/cognition:  Fragile, thin lady, awake, alert, oriented to history taking and casual conversation CRANIAL NERVES: CN II: Visual fields are full to confrontation. Pupils are round equal and briskly reactive to light. CN III, IV, VI: extraocular movement are normal. No ptosis. CN V: Facial sensation is intact to light touch CN VII: Face is symmetric with normal eye closure  CN VIII: Hearing is normal to causal conversation. CN IX, X: Phonation is normal. CN XI: Head turning and shoulder shrug are intact  MOTOR: Full strength in all tested extremities, limited LLE testing due to left hip pain s/p replacement   REFLEXES: Reflexes are 2 and symmetric at the biceps, triceps, knees, and ankles. Plantar responses are flexor.  SENSORY: Intact to light  touch, pinprick and vibratory sensation are intact in fingers and toes.  COORDINATION: There is no trunk or limb dysmetria noted.  GAIT/STANCE: Able to get up from seated position arm crossed, cautious gait   REVIEW OF SYSTEMS:  Full 14 system review of systems performed and notable only for as above All other review of systems were negative.   ALLERGIES: Allergies   Allergen Reactions   Ultram [Tramadol] Other (See Comments)    Possible seizures   Aleve [Naproxen] Other (See Comments)    Ulcers   Coconut (Cocos Nucifera) Swelling   Depakote [Divalproex Sodium] Other (See Comments)    Insomnia . (Changed from divalproex to Lamictal due to side effects)   Nsaids Other (See Comments)    History of bleeding ulcers.   Pineapple Other (See Comments)    Migraine    HOME MEDICATIONS: Current Outpatient Medications  Medication Sig Dispense Refill   acetaminophen (TYLENOL) 500 MG tablet Take 1,000 mg by mouth every 6 (six) hours as needed for moderate pain or headache.     albuterol (VENTOLIN HFA) 108 (90 Base) MCG/ACT inhaler Inhale 1-2 puffs into the lungs every 4 (four) hours as needed for wheezing or shortness of breath.     ALPRAZolam (XANAX) 0.25 MG tablet Take 0.25 mg by mouth 4 (four) times daily as needed.     amitriptyline (ELAVIL) 25 MG tablet Take 50 mg by mouth at bedtime.     aspirin EC 81 MG tablet Take 1 tablet (81 mg total) by mouth 2 (two) times daily. To prevent blood clots for 30 days after surgery. 60 tablet 0   dexlansoprazole (DEXILANT) 60 MG capsule Take 60 mg by mouth daily.     diclofenac Sodium (VOLTAREN) 1 % GEL Apply 2 g topically daily as needed (Pain).     furosemide (LASIX) 20 MG tablet Take 1 tablet (20 mg total) by mouth daily. 90 tablet 3   lamoTRIgine (LAMICTAL) 100 MG tablet Take 1 tablet (100 mg total) by mouth 2 (two) times daily. 180 tablet 3   linaclotide (LINZESS) 145 MCG CAPS capsule Take 145 mcg by mouth daily before breakfast.     Milnacipran (SAVELLA) 50 MG TABS tablet Take 50 mg by mouth 2 (two) times daily.     oxyCODONE (ROXICODONE) 5 MG immediate release tablet Take 1 tablet (5 mg total) by mouth every 4 (four) hours as needed for severe pain (after surgery that is not resolved by your normal daily dose of Vicodin). (Patient taking differently: Take 10 mg by mouth every 4 (four) hours as needed for severe  pain (after surgery that is not resolved by your normal daily dose of Vicodin).) 35 tablet 0   promethazine (PHENERGAN) 25 MG tablet Take 25 mg by mouth every 6 (six) hours as needed for nausea or vomiting.     rizatriptan (MAXALT) 10 MG tablet Take 10 mg by mouth See admin instructions. 10 mg as needed for migraine, may repeat 10 mg in 2 hours if headache persists or recurs.     metoprolol tartrate (LOPRESSOR) 25 MG tablet Take 1 tablet (25 mg total) by mouth 2 (two) times daily. (Patient taking differently: Take 25 mg by mouth 3 (three) times a week.) 180 tablet 3   No current facility-administered medications for this visit.    PAST MEDICAL HISTORY: Past Medical History:  Diagnosis Date   Bruising, spontaneous 12/08/2013   Cancer (HCC)    cervical cancer - had hysterectomy   Chronic back pain  Chronic back pain    DDD (degenerative disc disease), lumbar    DDD (degenerative disc disease), lumbar    Fibromyalgia    Headache(784.0)    migraines   Ovarian cyst    Partial tear subscapularis tendon 12/27/2011   Scoliosis    Seizures (HCC)    started 9/12-    PAST SURGICAL HISTORY: Past Surgical History:  Procedure Laterality Date   ABDOMINAL HYSTERECTOMY  2012   BACK SURGERY  1989   scoliosis throsic-rods   CESAREAN SECTION     CHOLECYSTECTOMY N/A 11/17/2012   Procedure: LAPAROSCOPIC CHOLECYSTECTOMY WITH INTRAOPERATIVE CHOLANGIOGRAM;  Surgeon: Axel Filler, MD;  Location: [facility] OR;  Service: General;  Laterality: N/A;   COLONOSCOPY WITH PROPOFOL N/A 04/21/2020   Procedure: COLONOSCOPY WITH PROPOFOL;  Surgeon: Jeani Hawking, MD;  Location: [facility];  Service: Endoscopy;  Laterality: N/A;   ENDOSCOPIC RETROGRADE CHOLANGIOPANCREATOGRAPHY (ERCP) WITH PROPOFOL N/A 06/11/2019   Procedure: ENDOSCOPIC RETROGRADE CHOLANGIOPANCREATOGRAPHY (ERCP) WITH PROPOFOL;  Surgeon: Jeani Hawking, MD;  Location: [facility];  Service: Endoscopy;  Laterality: N/A;   ERCP N/A 05/07/2013    Procedure: ENDOSCOPIC RETROGRADE CHOLANGIOPANCREATOGRAPHY (ERCP);  Surgeon: Theda Belfast, MD;  Location: [facility];  Service: Endoscopy;  Laterality: N/A;  start ercp first, if canulation fails switch to EUS    ESOPHAGOGASTRODUODENOSCOPY (EGD) WITH PROPOFOL N/A 05/15/2019   Procedure: ESOPHAGOGASTRODUODENOSCOPY (EGD) WITH PROPOFOL;  Surgeon: West Bali, MD;  [facility];  Service: Endoscopy;  Laterality: N/A;   ESOPHAGOGASTRODUODENOSCOPY (EGD) WITH PROPOFOL N/A 03/03/2020   Procedure: ESOPHAGOGASTRODUODENOSCOPY (EGD) WITH PROPOFOL;  Surgeon: Jeani Hawking, MD;  [facility];  Service: Endoscopy;  Laterality: N/A;   ESOPHAGOGASTRODUODENOSCOPY (EGD) WITH PROPOFOL N/A 04/21/2020   Procedure: ESOPHAGOGASTRODUODENOSCOPY (EGD) WITH PROPOFOL;  Surgeon: Jeani Hawking, MD;  [facility];  Service: Endoscopy;  Laterality: N/A;   EUS N/A 11/03/2012   Procedure: UPPER ENDOSCOPIC ULTRASOUND (EUS) LINEAR;  Surgeon: Theda Belfast, MD;  [facility]: [facility];  Service: Endoscopy;  Laterality: N/A;   left arm     NECK SURGERY     POLYPECTOMY  04/21/2020   Procedure: POLYPECTOMY;  Surgeon: Jeani Hawking, MD;  [facility];  Service: Endoscopy;;   REMOVAL OF STONES  06/11/2019   Procedure: REMOVAL OF STONES;  Surgeon: Jeani Hawking, MD;  [facility]: [facility];  Service: Endoscopy;;   TOOTH EXTRACTION N/A 05/24/2022   Procedure: DENTAL RESTORATION/EXTRACTIONS;  Surgeon: Ocie Doyne, DMD;  Location: [facility];  Service: Oral Surgery;  Laterality: N/A;   TOTAL HIP ARTHROPLASTY Left 11/17/2022   Procedure: TOTAL HIP ARTHROPLASTY ANTERIOR APPROACH;  Surgeon: Sheral Apley, MD;  Location: [facility];  Service: Orthopedics;  Laterality: Left;   TUBAL LIGATION     UPPER ESOPHAGEAL ENDOSCOPIC ULTRASOUND (EUS) N/A 06/11/2019   Procedure: UPPER ESOPHAGEAL ENDOSCOPIC ULTRASOUND (EUS);  Surgeon: Jeani Hawking, MD;  Location: [facility];  Service: Endoscopy;  Laterality: N/A;     FAMILY HISTORY: Family History  Problem Relation Age of Onset   Hypertension Mother    Hypertension Father    Cancer Father        prostate   Heart disease Father    Hypertension Brother    Hypertension Brother     SOCIAL HISTORY: Social History   Socioeconomic History   Marital status: Married    Spouse name: Not on file   Number of children: Not on file   Years of education: Not on file   Highest education level: Not on file  Occupational History  Not on file  Tobacco Use   Smoking status: Every Day    Current packs/day: 0.50    Average packs/day: 0.5 packs/day for 20.0 years (10.0 ttl pk-yrs)    Types: Cigarettes   Smokeless tobacco: Never  Vaping Use   Vaping status: Never Used  Substance and Sexual Activity   Alcohol use: Not Currently   Drug use: No   Sexual activity: Not on file  Other Topics Concern   Not on file  Social History Narrative   Live with husband 2 dogs and 4 cats   Right handed   Caffeine- 24oz-36oz daily   Social Determinants of Health   Financial Resource Strain: Not on file  Food Insecurity: No Food Insecurity (11/16/2022)   Hunger Vital Sign    Worried About Running Out of Food in the Last Year: Never true    Ran Out of Food in the Last Year: Never true  Transportation Needs: No Transportation Needs (11/16/2022)   PRAPARE - Administrator, Civil Service (Medical): No    Lack of Transportation (Non-Medical): No  Physical Activity: Not on file  Stress: Not on file  Social Connections: Not on file  Intimate Partner Violence: Not At Risk (11/16/2022)   Humiliation, Afraid, Rape, and Kick questionnaire    Fear of Current or Ex-Partner: No    Emotionally Abused: No    Physically Abused: No    Sexually Abused: No      I spent 40 minutes of face-to-face and non-face-to-face time with patient.  This included previsit chart review including review of hospitalizations, lab review, study review, order entry, electronic  health record documentation, patient education and discussion regarding above diagnoses and treatment plan and answered all other questions to patient's satisfaction  Ihor Austin, [facility]  [facility] [address] [place], [place] [address]  Phone [phone] Fax [phone] Note: This document was prepared with digital dictation and possible smart phrase technology. Any transcriptional errors that result from this process are unintentional.

## 2023-03-19 NOTE — Patient Instructions (Addendum)
Your Plan:  Increase lamotrigine to 150mg  twice daily for seizure prevention  Please call with any additional seizure activity  Will check lab work today  Hopefully with increasing lamotrigine dose, your headaches will improve. More recent worsening likely due to storms and changes in barometric pressure. Continue rizatriptan for now. If headaches persist or worsen, please let us know     Follow up in 6 months or call earlier if needed      Thank you for coming to see [place] at [facility]. I hope we have been able to provide you high quality care today.  You may receive a patient satisfaction survey over the next few weeks. We would appreciate your feedback and comments so that we may continue to improve ourselves and the health of our patients.

## 2023-03-20 ENCOUNTER — Telehealth: Payer: Self-pay

## 2023-03-20 LAB — COMPREHENSIVE METABOLIC PANEL
ALT: 45 IU/L — ABNORMAL HIGH (ref 0–32)
AST: 73 IU/L — ABNORMAL HIGH (ref 0–40)
Albumin: 4.5 g/dL (ref 3.8–4.9)
Alkaline Phosphatase: 652 IU/L — ABNORMAL HIGH (ref 44–121)
BUN/Creatinine Ratio: 22 (ref 9–23)
BUN: 14 mg/dL (ref 6–24)
Bilirubin Total: 0.3 mg/dL (ref 0.0–1.2)
CO2: 25 mmol/L (ref 20–29)
Calcium: 9.4 mg/dL (ref 8.7–10.2)
Chloride: 101 mmol/L (ref 96–106)
Creatinine, Ser: 0.65 mg/dL (ref 0.57–1.00)
Globulin, Total: 2.2 g/dL (ref 1.5–4.5)
Glucose: 73 mg/dL (ref 70–99)
Potassium: 4.2 mmol/L (ref 3.5–5.2)
Sodium: 141 mmol/L (ref 134–144)
Total Protein: 6.7 g/dL (ref 6.0–8.5)
eGFR: 105 mL/min/{1.73_m2} (ref >59)

## 2023-03-20 LAB — LAMOTRIGINE LEVEL: Lamotrigine Lvl: 4.3 ug/mL (ref 2.0–20.0)

## 2023-03-20 NOTE — Telephone Encounter (Signed)
-----   Message from Ihor Austin sent at 03/20/2023 12:39 PM EDT ----- Please call patient regarding results - lab work showed improvement of prior abnormal electrolytes although showed elevated LFT's. Was previously elevated in May while hospitalized 2/2 unknown cause. Please have patient f/u with PCP for further evaluation. Still waiting on lamotrigine level, she will be notified if abnormal otherwise continue current regimen. Thank you.

## 2023-03-20 NOTE — Telephone Encounter (Signed)
Contacted pt, informed her lab work showed improvement of prior abnormal electrolytes although showed elevated LFT's. Was previously elevated in May while hospitalized 2/2 unknown cause. She did state this was the 3rd lab she has done with LFTs elevated. I advised patient f/u with PCP for further evaluation. Also informed we were still waiting on lamotrigine level, she will be notified if abnormal otherwise continue current regimen.  Patient verbally understood results and was appreciative.  Advised to call the office back with any questions or concerns as she had none at this time.

## 2023-03-24 ENCOUNTER — Telehealth: Payer: Self-pay | Admitting: *Deleted

## 2023-03-24 NOTE — Telephone Encounter (Signed)
Called pt not able to leave message.

## 2023-03-25 ENCOUNTER — Encounter: Payer: Self-pay | Admitting: Adult Health

## 2023-03-26 MED ORDER — QULIPTA 60 MG PO TABS: 60.0000 mg | ORAL_TABLET | Freq: Every day | ORAL | 5 refills | Status: DC

## 2023-03-26 NOTE — Telephone Encounter (Signed)
Can try CGRP injectable or Qulipta based on failed/tried medications if patient interested.  Please advise that she can take second dose of rizatriptan after 2 hours if needed but not more than 2 tablets in a 24 hr period, would not recommend use more than 2-3x per week due to potential rebound headache. Limit use of tylenol especially if it isn't helping as frequent use can also cause rebound headache. Please let me know if she wishes to try injectable medications or Qulipta

## 2023-03-26 NOTE — Addendum Note (Signed)
Addended by: Judi Cong on: 03/26/2023 05:33 PM   Modules accepted: Orders

## 2023-04-01 ENCOUNTER — Other Ambulatory Visit (HOSPITAL_COMMUNITY): Payer: Self-pay | Admitting: Family Medicine

## 2023-04-01 DIAGNOSIS — R748 Abnormal levels of other serum enzymes: Secondary | ICD-10-CM

## 2023-04-03 ENCOUNTER — Other Ambulatory Visit (HOSPITAL_COMMUNITY): Payer: Self-pay

## 2023-04-03 ENCOUNTER — Telehealth: Payer: Self-pay

## 2023-04-03 NOTE — Telephone Encounter (Signed)
Pharmacy Patient Advocate Encounter   Received notification from Patient Advice Request messages that prior authorization for Qulipta 60MG  tablets is required/requested.   Insurance verification completed.   The patient is insured through [facility] .   Per test claim: PA required; PA submitted to [facility] via CoverMyMeds Key/confirmation #/EOC [facility] Status is pending

## 2023-04-04 ENCOUNTER — Other Ambulatory Visit (HOSPITAL_COMMUNITY): Payer: Self-pay

## 2023-04-04 NOTE — Telephone Encounter (Signed)
Pharmacy Patient Advocate Encounter  Received notification from [facility] that Prior Authorization for Qulipta 60MG  tablets has been APPROVED from 04/03/2023 to 06/23/2024. Ran test claim, Copay is $Unable to obtain-Refill Too Soon Rejection: LFD:04/03/2023, NFD:04/26/2023. This test claim was processed through [facility]- copay amounts may vary at other pharmacies due to pharmacy/plan contracts, or as the patient moves through the different stages of their insurance plan.   PA #/Case ID/Reference #: PA Case ID #: [id number]

## 2023-04-08 ENCOUNTER — Ambulatory Visit (HOSPITAL_COMMUNITY): Payer: 59

## 2023-04-10 ENCOUNTER — Encounter (HOSPITAL_COMMUNITY): Payer: Self-pay

## 2023-04-10 ENCOUNTER — Ambulatory Visit (HOSPITAL_COMMUNITY): Payer: 59

## 2023-04-11 ENCOUNTER — Other Ambulatory Visit (HOSPITAL_COMMUNITY)
Admission: RE | Admit: 2023-04-11 | Discharge: 2023-04-11 | Disposition: A | Discharge status: Home / Self Care | Payer: 59 | Source: Ambulatory Visit | Attending: Internal Medicine | Admitting: Internal Medicine

## 2023-04-11 DIAGNOSIS — Z79899 Other long term (current) drug therapy: Secondary | ICD-10-CM | POA: Insufficient documentation

## 2023-04-11 DIAGNOSIS — I5043 Acute on chronic combined systolic (congestive) and diastolic (congestive) heart failure: Secondary | ICD-10-CM | POA: Diagnosis present

## 2023-04-11 DIAGNOSIS — R600 Localized edema: Secondary | ICD-10-CM | POA: Diagnosis present

## 2023-04-11 LAB — BRAIN NATRIURETIC PEPTIDE: B Natriuretic Peptide: 12 pg/mL (ref 0.0–100.0)

## 2023-04-11 NOTE — Telephone Encounter (Signed)
Patient notified and verbalized understanding.   OV scheduled for Monday, 04/14/2023 at 9:30 am with Sharlene Dory, NP here in [place] office.    She will go to [facility] lab for BNP today.    Will also send her some heart failure info via mychart.

## 2023-04-14 ENCOUNTER — Other Ambulatory Visit (INDEPENDENT_AMBULATORY_CARE_PROVIDER_SITE_OTHER): Payer: 59

## 2023-04-14 ENCOUNTER — Telehealth: Payer: Self-pay | Admitting: Nurse Practitioner

## 2023-04-14 ENCOUNTER — Other Ambulatory Visit: Payer: Self-pay | Admitting: Nurse Practitioner

## 2023-04-14 ENCOUNTER — Encounter: Payer: Self-pay | Admitting: Nurse Practitioner

## 2023-04-14 ENCOUNTER — Ambulatory Visit: Payer: 59 | Attending: Nurse Practitioner | Admitting: Nurse Practitioner

## 2023-04-14 VITALS — BP 118/84 | HR 121 | Ht 60.0 in | Wt 97.6 lb

## 2023-04-14 DIAGNOSIS — I479 Paroxysmal tachycardia, unspecified: Secondary | ICD-10-CM

## 2023-04-14 DIAGNOSIS — R55 Syncope and collapse: Secondary | ICD-10-CM | POA: Diagnosis not present

## 2023-04-14 DIAGNOSIS — R296 Repeated falls: Secondary | ICD-10-CM

## 2023-04-14 DIAGNOSIS — R5383 Other fatigue: Secondary | ICD-10-CM

## 2023-04-14 DIAGNOSIS — G909 Disorder of the autonomic nervous system, unspecified: Secondary | ICD-10-CM | POA: Diagnosis not present

## 2023-04-14 DIAGNOSIS — R000 Tachycardia, unspecified: Secondary | ICD-10-CM

## 2023-04-14 DIAGNOSIS — R42 Dizziness and giddiness: Secondary | ICD-10-CM

## 2023-04-14 DIAGNOSIS — R0789 Other chest pain: Secondary | ICD-10-CM

## 2023-04-14 DIAGNOSIS — R531 Weakness: Secondary | ICD-10-CM

## 2023-04-14 DIAGNOSIS — R600 Localized edema: Secondary | ICD-10-CM

## 2023-04-14 DIAGNOSIS — R002 Palpitations: Secondary | ICD-10-CM

## 2023-04-14 MED ORDER — COMPRESSION BANDAGES KIT: 1.0000 | PACK | Freq: Every day | 0 refills | Status: DC

## 2023-04-14 NOTE — Telephone Encounter (Signed)
Checking percert on the following patient    LONG TERM MONITOR-LIVE TELEMETRY (3-14 DAYS)

## 2023-04-14 NOTE — Progress Notes (Unsigned)
Cardiology Office Note:  .   Date:  04/14/2023 ID:  Elby Showers Dibella, DOB 03/31/1971, MRN [id number] PCP: Elfredia Nevins, MD  [facility] Providers Cardiologist:  Marjo Bicker, MD    History of Present Illness: .   Celsey Nakamoto Halling is a 52 y.o. female with a PMH of chest pain, shortness of breath, history of seizures, migraines, palpitations, fibromyalgia, chronic back pain, history of cervical cancer, s/p hysterectomy, scoliosis, and leg swelling, who presents today for leg edema evaluation.  Last seen by Dr. Jenene Slicker on December 05, 2022.  Was referred to cardiology clinic for chest pain evaluation.  Patient noted to have constant palpitations lasting all day and intermittent chest pains lasting few minutes to hours, not related to exertion or rest, patient also noted associated shortness of breath.  Denied leg swelling, syncope, worsening DOE.  Admitted to tobacco use.  Lexiscan obtained and was normal.  Event monitor was overall unremarkable-see full report below.  Echocardiogram report was benign-see full report below.  ED visit on 12/15/2022 at [facility] for weakness.  Was noted that patient had substantial decline in condition after hip replacement surgery 3 weeks prior.  Had noted multiple episodes of vomiting, retching, decreased interactivity.  It was noted that she was ill-appearing on exam.  Was given IV fluids and Zofran.  Was in normal sinus rhythm.  UA did not show evidence of UTI.  Was discharged later that day.  Today she presents for leg edema evaluation.  She recently contacted our office noting leg edema, see telephone note from 01/08/2023 and MyChart notes from September 2024. Patient presents today for leg edema evaluation, but also has multiple chief concerns.  Leg edema started a while ago after she broke her hip earlier this year and noticed by both daughters who work in [employer], has been overall stable, and lasix has seemed to help.   Heart rate on arrival today is  121 with vital signs.  She says this has been going on for quite some time and says she is able to do breathing techniques that works most of the time to calm her heart rate.  Does drink caffeine, around 2 bottles of Coke and a cup of coffee daily.  Says her heart rate goes fast about once or twice per day.  Says she can feel her chest beating fast at the current moment during office visit.  Along with her symptoms, she admits to syncopal episodes that have happened prior to her hip surgery and also after her hip surgery earlier this year in 27-Nov-2022.  She states she has passed out twice since her hip surgery. 1 time she stood up after using the bathroom and passed out.  Second time occurred also while standing up. Only 2 falls since her hips surgery, denies any acute injuries. She also mentions fatigue and weakness to me and refers to her ED visits related to this, believes her past ED visit was due to sepsis (12/15/2022).   She also notes dizziness about 2 days/week, says typically anything makes her dizzy specifically head movements or when standing up too quickly.  Has also had chronic intermittent chest pain for the last year.  While talking with the patient during the interview, she grabs her chest and has chest pain during the office visit, she says this may be related to her history of chronic pain-sees pain management specialist, also notes arm pain during the visit.  See EKG below that was obtained STAT.  After EKG was obtained, chest pain was relieved and only noted arm pain. Denies any shortness of breath, orthopnea, PND, significant weight changes, acute bleeding, or claudication.     ROS: Negative. See HPI.   Studies Reviewed: Marland Kitchen    EKG: EKG Interpretation Date/Time:  Monday April 14 2023 09:56:00 EDT Ventricular Rate:  125 PR Interval:  148 QRS Duration:  80 QT Interval:  320 QTC Calculation: 461 R Axis:   -83  Text Interpretation: Sinus tachycardia with Premature atrial complexes  Biatrial enlargement Left axis deviation Pulmonary disease pattern When compared with ECG of 15-Dec-2022 13:29, PREVIOUS ECG IS PRESENT Confirmed by Sharlene Dory [phone]) on 04/14/2023 10:09:20 AM   Echo 12/2022:  1. Left ventricular ejection fraction, by estimation, is 60 to 65%. The  left ventricle has normal function. The left ventricle has no regional  wall motion abnormalities. Left ventricular diastolic parameters are  consistent with Grade I diastolic  dysfunction (impaired relaxation). Elevated left ventricular end-diastolic  pressure. The E/e' is 18.   2. Right ventricular systolic function is normal. The right ventricular  size is normal. There is normal pulmonary artery systolic pressure.   3. The mitral valve is normal in structure. Mild mitral valve  regurgitation. No evidence of mitral stenosis.   4. The aortic valve has an indeterminant number of cusps. Aortic valve  regurgitation is not visualized. No aortic stenosis is present.   5. The inferior vena cava is normal in size with greater than 50%  respiratory variability, suggesting right atrial pressure of 3 mmHg.   Comparison(s): No prior Echocardiogram.  Monitor 11/2022:    Patch wear time was 7 days and 19 hours.   Normal sinus rhythm predominantly ranging from 60 to 174 bpm with average HR 79 bpm.   2 runs of SVT, fastest lasting 4 beats/1.4 seconds and longest lasting 7 beats/3 seconds. No ventricular arrhythmias.   No AV block or pauses.   <1% PAC and <1% PVC burden.   Patient triggered events correlate with NSR (77-90 bpm) and SVE.  Lexiscan 11/2022:    Stress ECG is negative for ischemia and arrhythmias.   LV perfusion is normal. There is no evidence of ischemia. There is no evidence of infarction.   Left ventricular function is normal. Nuclear stress EF: 70 %.   Findings are consistent with no ischemia. The study is low risk.  Physical Exam:   VS:  BP 118/84   Pulse (!) 121   Ht 5' (1.524 m)   Wt 97 lb 9.6  oz (44.3 kg)   SpO2 100%   BMI 19.06 kg/m    Wt Readings from Last 3 Encounters:  04/14/23 97 lb 9.6 oz (44.3 kg)  03/19/23 101 lb 6.4 oz (46 kg)  12/05/22 85 lb (38.6 kg)    Orthostatics: Lying: 120/79, heart rate 118 bpm Sitting: 116/75, heart rate 124 bpm Standing: 113/74, 132 bpm Standing at 3 minutes: 116/81, 137 bpm  GEN: Thin, 52 y.o. female in no acute distress NECK: No JVD; No carotid bruits CARDIAC: S1/S2, fast rate and regular rhythm, no murmurs, rubs, or gallops RESPIRATORY:  Clear to auscultation without rales, wheezing or rhonchi  EXTREMITIES:  Trace nonpitting edema bilaterally; No deformity   ASSESSMENT AND PLAN: .    Atypical chest pain Occurred during office visit, brief in duration, stat EKG did not reveal any acute ischemic changes, has been ongoing intermittently for the past year and related to her history of chronic pain and fibromyalgia.  She  sees a pain management specialist. Provided reassurance that past Lexiscan was normal.  Care and ED precautions discussed. She verbalized understanding.  Continue current medication regimen continue follow-up with PCP.  Autonomic dysfunction, syncope/orthostatic dizziness, palpitations/tachycardia Heart rate today on arrival in the 120s, has been ongoing for a while per her report.  Past monitor was overall benign.  She has had 2 episodes of syncope, possibly orthostatic in etiology, since hip replacement surgery and also admits to orthostatic dizziness. Orthostatics were obtained today that revealed a component of autonomic dysfunction, no hypotension, denies any caffeine intake this morning.  Will arrange 14-day live ZIO monitor to evaluate for any arrhythmias due to history of syncope.  Encouraged her to wean off caffeine, increase water intake and salt intake. Care and ED precautions discussed.   Leg edema Trace leg edema bilaterally. Continue Lasix. Will write Rx for compression stockings. Recent BNP normal.    Fatigue, weakness, falls Etiology multifactorial. Multiple medications could be contributing to this and could be d/t her hip surgery. Continue to follow-up with PCP, may benefit from [facility] therapy. Care and ED precautions discussed. Arranging monitor as mentioned above.   Dispo: Follow-up with me/APP in 6-8 weeks or sooner if anything changes.  Signed, Sharlene Dory, NP

## 2023-04-14 NOTE — Patient Instructions (Addendum)
Medication Instructions:  Your physician recommends that you continue on your current medications as directed. Please refer to the Current Medication list given to you today.  Labwork: None   Testing/Procedures: Your physician has recommended that you wear a Zio monitor.   This monitor is a medical device that records the heart's electrical activity. Doctors most often use these monitors to diagnose arrhythmias. Arrhythmias are problems with the speed or rhythm of the heartbeat. The monitor is a small device applied to your chest. You can wear one while you do your normal daily activities. While wearing this monitor if you have any symptoms to push the button and record what you felt. Once you have worn this monitor for the period of time provider prescribed (for 14 days), you will return the monitor device in the postage paid box. Once it is returned they will download the data collected and provide [place] with a report which the provider will then review and we will call you with those results. Important tips:  Avoid showering during the first 24 hours of wearing the monitor. Avoid excessive sweating to help maximize wear time. Do not submerge the device, no hot tubs, and no swimming pools. Keep any lotions or oils away from the patch. After 24 hours you may shower with the patch on. Take brief showers with your back facing the shower head.  Do not remove patch once it has been placed because that will interrupt data and decrease adhesive wear time. Push the button when you have any symptoms and write down what you were feeling. Once you have completed wearing your monitor, remove and place into box which has postage paid and place in your outgoing mailbox.  If for some reason you have misplaced your box then call our office and we can provide another box and/or mail it off for you.  Follow-Up: Your physician recommends that you schedule a follow-up appointment in: 6-8 weeks   Any Other Special  Instructions Will Be Listed Below (If Applicable).  If you need a refill on your cardiac medications before your next appointment, please call your pharmacy.

## 2023-04-15 ENCOUNTER — Telehealth: Payer: Self-pay | Admitting: Nurse Practitioner

## 2023-04-15 NOTE — Telephone Encounter (Signed)
Tammy with [employer] called in stating they need the correct diagnosis code for prescription sent in for Compression socks. She states she needs it in order to run it through insurance.

## 2023-04-15 NOTE — Telephone Encounter (Signed)
Informed Sharon Welch with pharm - LE edema is dx.

## 2023-04-17 ENCOUNTER — Other Ambulatory Visit (HOSPITAL_COMMUNITY): Payer: Self-pay | Admitting: Family Medicine

## 2023-04-17 DIAGNOSIS — R748 Abnormal levels of other serum enzymes: Secondary | ICD-10-CM

## 2023-04-24 ENCOUNTER — Other Ambulatory Visit (HOSPITAL_COMMUNITY): Payer: 59

## 2023-04-29 ENCOUNTER — Ambulatory Visit (HOSPITAL_COMMUNITY)
Admission: RE | Admit: 2023-04-29 | Discharge: 2023-04-29 | Disposition: A | Discharge status: Home / Self Care | Payer: 59 | Source: Ambulatory Visit | Attending: Family Medicine | Admitting: Family Medicine

## 2023-04-29 ENCOUNTER — Other Ambulatory Visit (HOSPITAL_COMMUNITY): Payer: Self-pay | Admitting: Family Medicine

## 2023-04-29 DIAGNOSIS — R748 Abnormal levels of other serum enzymes: Secondary | ICD-10-CM | POA: Diagnosis present

## 2023-04-29 MED ORDER — GADOBUTROL 1 MMOL/ML IV SOLN
5.0000 mL | Freq: Once | INTRAVENOUS | Status: AC | PRN
  Administered 2023-04-29: 5 mL via INTRAVENOUS

## 2023-05-10 DIAGNOSIS — Z7982 Long term (current) use of aspirin: Secondary | ICD-10-CM | POA: Diagnosis not present

## 2023-05-10 DIAGNOSIS — W1839XA Other fall on same level, initial encounter: Secondary | ICD-10-CM | POA: Insufficient documentation

## 2023-05-10 DIAGNOSIS — S0083XA Contusion of other part of head, initial encounter: Secondary | ICD-10-CM | POA: Insufficient documentation

## 2023-05-10 DIAGNOSIS — M25552 Pain in left hip: Secondary | ICD-10-CM | POA: Insufficient documentation

## 2023-05-10 DIAGNOSIS — F17210 Nicotine dependence, cigarettes, uncomplicated: Secondary | ICD-10-CM | POA: Insufficient documentation

## 2023-05-10 DIAGNOSIS — Z8541 Personal history of malignant neoplasm of cervix uteri: Secondary | ICD-10-CM | POA: Diagnosis not present

## 2023-05-10 DIAGNOSIS — M25572 Pain in left ankle and joints of left foot: Secondary | ICD-10-CM | POA: Insufficient documentation

## 2023-05-10 DIAGNOSIS — M79602 Pain in left arm: Secondary | ICD-10-CM | POA: Insufficient documentation

## 2023-05-10 DIAGNOSIS — S0993XA Unspecified injury of face, initial encounter: Secondary | ICD-10-CM | POA: Diagnosis present

## 2023-05-11 ENCOUNTER — Emergency Department (HOSPITAL_COMMUNITY): Payer: 59

## 2023-05-11 ENCOUNTER — Other Ambulatory Visit: Payer: Self-pay

## 2023-05-11 ENCOUNTER — Encounter (HOSPITAL_COMMUNITY): Payer: Self-pay

## 2023-05-11 ENCOUNTER — Emergency Department (HOSPITAL_COMMUNITY)
Admission: EM | Admit: 2023-05-11 | Discharge: 2023-05-11 | Disposition: A | Discharge status: Home / Self Care | Payer: 59 | Attending: Student | Admitting: Student

## 2023-05-11 DIAGNOSIS — M25572 Pain in left ankle and joints of left foot: Secondary | ICD-10-CM

## 2023-05-11 DIAGNOSIS — M25552 Pain in left hip: Secondary | ICD-10-CM

## 2023-05-11 DIAGNOSIS — S0083XA Contusion of other part of head, initial encounter: Secondary | ICD-10-CM | POA: Diagnosis not present

## 2023-05-11 DIAGNOSIS — M25512 Pain in left shoulder: Secondary | ICD-10-CM

## 2023-05-11 DIAGNOSIS — R55 Syncope and collapse: Secondary | ICD-10-CM

## 2023-05-11 DIAGNOSIS — W19XXXA Unspecified fall, initial encounter: Secondary | ICD-10-CM

## 2023-05-11 LAB — RAPID URINE DRUG SCREEN, HOSP PERFORMED
Amphetamines: NOT DETECTED
Barbiturates: NOT DETECTED
Benzodiazepines: POSITIVE — AB
Cocaine: NOT DETECTED
Opiates: POSITIVE — AB
Tetrahydrocannabinol: NOT DETECTED

## 2023-05-11 LAB — ETHANOL: Alcohol, Ethyl (B): <10 mg/dL (ref <10)

## 2023-05-11 LAB — COMPREHENSIVE METABOLIC PANEL
ALT: 25 U/L (ref 0–44)
AST: 22 U/L (ref 15–41)
Albumin: 3.5 g/dL (ref 3.5–5.0)
Alkaline Phosphatase: 241 U/L — ABNORMAL HIGH (ref 38–126)
Anion gap: 7 (ref 5–15)
BUN: 13 mg/dL (ref 6–20)
CO2: 25 mmol/L (ref 22–32)
Calcium: 8.2 mg/dL — ABNORMAL LOW (ref 8.9–10.3)
Chloride: 106 mmol/L (ref 98–111)
Creatinine, Ser: 0.7 mg/dL (ref 0.44–1.00)
GFR, Estimated: >60 mL/min (ref >60)
Glucose, Bld: 92 mg/dL (ref 70–99)
Potassium: 3.1 mmol/L — ABNORMAL LOW (ref 3.5–5.1)
Sodium: 138 mmol/L (ref 135–145)
Total Bilirubin: 0.7 mg/dL (ref <1.2)
Total Protein: 6.2 g/dL — ABNORMAL LOW (ref 6.5–8.1)

## 2023-05-11 LAB — URINALYSIS, ROUTINE W REFLEX MICROSCOPIC
Glucose, UA: NEGATIVE mg/dL
Hgb urine dipstick: NEGATIVE
Ketones, ur: NEGATIVE mg/dL
Leukocytes,Ua: NEGATIVE
Nitrite: NEGATIVE
Protein, ur: NEGATIVE mg/dL
Specific Gravity, Urine: 1.028 (ref 1.005–1.030)
pH: 5 (ref 5.0–8.0)

## 2023-05-11 LAB — CBC WITH DIFFERENTIAL/PLATELET
Abs Immature Granulocytes: 0.03 10*3/uL (ref 0.00–0.07)
Basophils Absolute: 0.1 10*3/uL (ref 0.0–0.1)
Basophils Relative: 1 %
Eosinophils Absolute: 0.1 10*3/uL (ref 0.0–0.5)
Eosinophils Relative: 1 %
HCT: 36.2 % (ref 36.0–46.0)
Hemoglobin: 12.1 g/dL (ref 12.0–15.0)
Immature Granulocytes: 0 %
Lymphocytes Relative: 26 %
Lymphs Abs: 1.9 10*3/uL (ref 0.7–4.0)
MCH: 30.6 pg (ref 26.0–34.0)
MCHC: 33.4 g/dL (ref 30.0–36.0)
MCV: 91.4 fL (ref 80.0–100.0)
Monocytes Absolute: 0.8 10*3/uL (ref 0.1–1.0)
Monocytes Relative: 12 %
Neutro Abs: 4.2 10*3/uL (ref 1.7–7.7)
Neutrophils Relative %: 60 %
Platelets: 224 10*3/uL (ref 150–400)
RBC: 3.96 MIL/uL (ref 3.87–5.11)
RDW: 13.2 % (ref 11.5–15.5)
WBC: 7 10*3/uL (ref 4.0–10.5)
nRBC: 0 % (ref 0.0–0.2)

## 2023-05-11 LAB — TROPONIN I (HIGH SENSITIVITY)
Troponin I (High Sensitivity): <2 ng/L (ref <18)
Troponin I (High Sensitivity): <2 ng/L (ref <18)

## 2023-05-11 MED ORDER — FENTANYL CITRATE PF 50 MCG/ML IJ SOSY
50.0000 ug | PREFILLED_SYRINGE | Freq: Once | INTRAMUSCULAR | Status: AC
  Administered 2023-05-11: 50 ug via INTRAVENOUS
  Filled 2023-05-11: qty 1

## 2023-05-11 MED ORDER — LACTATED RINGERS IV BOLUS
1000.0000 mL | Freq: Once | INTRAVENOUS | Status: AC
Start: 2023-05-11 — End: 2023-05-11
  Administered 2023-05-11: 1000 mL via INTRAVENOUS

## 2023-05-11 NOTE — ED Notes (Signed)
Pt sleeping at this time.

## 2023-05-11 NOTE — ED Triage Notes (Signed)
Pt did not want WC when offered, pt ambulatory from [facility] to room 18, pt from home with c/o falling twice this evening. Reports dizziness prior to each fall. C/o left groin/hip pain, left ankle pain, and left upper arm pain. Pt also has raised knot to right cheek area from one of reported falls. Pt A & O x4 with slow steady gait.

## 2023-05-11 NOTE — ED Provider Notes (Signed)
[place] EMERGENCY DEPARTMENT AT [facility] Provider Note  CSN: [id number] Arrival date & time: 05/10/23 2356  Chief Complaint(s) Fall  HPI Sharon Welch is a 52 y.o. female with PMH cervical cancer status post hysterectomy, degenerative disc disease, left-sided hip replacement who presents Emergency Department for evaluation of a syncopal event.  Patient states that she was walking in her house today, got very dizzy and suffered a syncopal event falling onto her left hip and ankle.  She was able to get up afterwards but felt some pain in the hip at that time.  She then went to sleep and awoke in [place] to use the bathroom and suffered an additional syncopal event striking her arm against the patient furniture and her face on the ground.  Patient arrives with complaints today of left hip pain, left ankle pain, facial pain and left arm pain.  Patient arrives with a hematoma over the cheek on the right.  No blood thinner use.  Denies chest pain, shortness of breath, abdominal pain, nausea, vomiting, numbness, tingling, weakness or other systemic complaints.   Past Medical History Past Medical History:  Diagnosis Date   Bruising, spontaneous 12/08/2013   Cancer (HCC)    cervical cancer - had hysterectomy   Chronic back pain    Chronic back pain    DDD (degenerative disc disease), lumbar    DDD (degenerative disc disease), lumbar    Fibromyalgia    Headache(784.0)    migraines   Ovarian cyst    Partial tear subscapularis tendon 12/27/2011   Scoliosis    Seizures (HCC)    started 9/12-   Patient Active Problem List   Diagnosis Date Noted   Palpitations 12/05/2022   Transaminitis 11/16/2022   Hip fracture (HCC) 11/15/2022   GERD (gastroesophageal reflux disease) 11/15/2022   Tobacco use 11/15/2022   Idiopathic hypotension 07/19/2022   Slurred speech 07/19/2022   Underweight 07/19/2022   Hypotension 07/19/2022   Seizure disorder (HCC) 07/18/2022   Chronic migraine w/o  aura w/o status migrainosus, not intractable 07/18/2022   Cerebral cavernoma 07/18/2022   Acute GI bleed due to bleeding pyloric/gastric ulcer 05/15/2019   PUD (peptic ulcer disease)-EGD 05/15/2019 with bleeding pyloric/gastric ulcer, nonbleeding duodenal ulcers and gastritis    Hematemesis with nausea    Migraine headache 09/25/2015   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 Authorizing Provider  albuterol (VENTOLIN HFA) 108 (90 Base) MCG/ACT inhaler Inhale 1-2 puffs into the lungs every 4 (four) hours as needed for wheezing or shortness of breath.    [provider]  ALPRAZolam Prudy Feeler) 0.25 MG tablet Take 0.25 mg by mouth 4 (four) times daily as needed. 01/23/23   [provider]  amitriptyline (ELAVIL) 25 MG tablet Take 50 mg by mouth at bedtime.    [provider]  aspirin EC 81 MG tablet Take 1 tablet (81 mg total) by mouth 2 (two) times daily. To prevent blood clots for 30 days after surgery. 11/19/22   Jenne Pane, PA-C  Compression Bandages KIT 1 each by Does not apply route daily. 04/14/23  Sharlene Dory, NP  dexlansoprazole (DEXILANT) 60 MG capsule Take 60 mg by mouth daily.    [provider]  furosemide (LASIX) 20 MG tablet Take 1 tablet (20 mg total) by mouth daily. 01/09/23 04/14/23  Mallipeddi, Vishnu P, MD  HYDROcodone-acetaminophen (NORCO) 10-325 MG tablet Take 1 tablet by mouth daily. 03/10/23   [provider]  lamoTRIgine (LAMICTAL) 150 MG tablet Take 1 tablet (150 mg total)  by mouth 2 (two) times daily. 03/19/23   Ihor Austin, NP  linaclotide (LINZESS) 145 MCG CAPS capsule Take 145 mcg by mouth daily before breakfast.    [provider]  meloxicam (MOBIC) 15 MG tablet Take 15 mg by mouth daily. 02/25/23   [provider]  methocarbamol (ROBAXIN) 500 MG tablet Take 500 mg by mouth 3 (three) times daily. 03/28/23   [provider]  metoprolol tartrate (LOPRESSOR) 25 MG tablet Take 25 mg by mouth 2 (two) times daily.    [provider]  Milnacipran (SAVELLA) 50 MG TABS tablet Take 50 mg by mouth 2 (two) times daily.    [provider]  promethazine (PHENERGAN) 25 MG tablet Take 25 mg by mouth every 6 (six) hours as needed for nausea or vomiting.    [provider]  rizatriptan (MAXALT) 10 MG tablet Take 10 mg by mouth See admin instructions. 10 mg as needed for migraine, may repeat 10 mg in 2 hours if headache persists or recurs.    [provider]                                                                                                                                    Past Surgical History Past Surgical History:  Procedure Laterality Date   ABDOMINAL HYSTERECTOMY  2012   BACK SURGERY  1989   scoliosis throsic-rods   CESAREAN SECTION     CHOLECYSTECTOMY N/A 11/17/2012   Procedure: LAPAROSCOPIC CHOLECYSTECTOMY WITH INTRAOPERATIVE CHOLANGIOGRAM;  Surgeon: Axel Filler, MD;  Location: [facility] OR;  Service: General;  Laterality: N/A;   COLONOSCOPY WITH PROPOFOL N/A 04/21/2020   Procedure: COLONOSCOPY WITH PROPOFOL;  Surgeon: Jeani Hawking, MD;  Location: [facility];  Service: Endoscopy;  Laterality: N/A;   ENDOSCOPIC RETROGRADE CHOLANGIOPANCREATOGRAPHY (ERCP) WITH PROPOFOL N/A 06/11/2019   Procedure: ENDOSCOPIC RETROGRADE CHOLANGIOPANCREATOGRAPHY (ERCP) WITH PROPOFOL;  Surgeon: Jeani Hawking, MD;  Location: [facility];  Service: Endoscopy;  Laterality: N/A;   ERCP N/A 05/07/2013   Procedure:  ENDOSCOPIC RETROGRADE CHOLANGIOPANCREATOGRAPHY (ERCP);  Surgeon: Theda Belfast, MD;  Location: [facility];  Service: Endoscopy;  Laterality: N/A;  start ercp first, if canulation fails switch to EUS    ESOPHAGOGASTRODUODENOSCOPY (EGD) WITH PROPOFOL N/A 05/15/2019   Procedure: ESOPHAGOGASTRODUODENOSCOPY (EGD) WITH PROPOFOL;  Surgeon: West Bali, MD;  Location: [facility];  Service: Endoscopy;  Laterality: N/A;   ESOPHAGOGASTRODUODENOSCOPY (EGD) WITH PROPOFOL N/A 03/03/2020   Procedure: ESOPHAGOGASTRODUODENOSCOPY (EGD) WITH PROPOFOL;  Surgeon: Jeani Hawking, MD;  Location: [facility];  Service: Endoscopy;  Laterality: N/A;   ESOPHAGOGASTRODUODENOSCOPY (EGD) WITH PROPOFOL N/A 04/21/2020   Procedure: ESOPHAGOGASTRODUODENOSCOPY (EGD) WITH PROPOFOL;  Surgeon: Jeani Hawking, MD;  Location: [facility];  Service: Endoscopy;  Laterality: N/A;   EUS N/A 11/03/2012   Procedure: UPPER ENDOSCOPIC ULTRASOUND (EUS) LINEAR;  Surgeon: Theda Belfast, MD;  Location: [facility];  Service: Endoscopy;  Laterality: N/A;   left arm     NECK SURGERY     POLYPECTOMY  04/21/2020   Procedure: POLYPECTOMY;  Surgeon: Jeani Hawking, MD;  Location: [facility];  Service: Endoscopy;;   REMOVAL OF STONES  06/11/2019   Procedure: REMOVAL OF STONES;  Surgeon: Jeani Hawking, MD;  Location: [facility];  Service: Endoscopy;;   TOOTH EXTRACTION N/A 05/24/2022   Procedure: DENTAL RESTORATION/EXTRACTIONS;  Surgeon: Ocie Doyne, DMD;  Location: [facility] OR;  Service: Oral Surgery;  Laterality: N/A;   TOTAL HIP ARTHROPLASTY Left 11/17/2022   Procedure: TOTAL HIP ARTHROPLASTY ANTERIOR APPROACH;  Surgeon: Sheral Apley, MD;  Location: [facility] OR;  Service: Orthopedics;  Laterality: Left;   TUBAL LIGATION     UPPER ESOPHAGEAL ENDOSCOPIC ULTRASOUND (EUS) N/A 06/11/2019   Procedure: UPPER ESOPHAGEAL ENDOSCOPIC ULTRASOUND (EUS);  Surgeon: Jeani Hawking, MD;  Location: [facility];  Service: Endoscopy;  Laterality: N/A;   Family  History Family History  Problem Relation Age of Onset   Hypertension Mother    Hypertension Father    Cancer Father        prostate   Heart disease Father    Hypertension Brother    Hypertension Brother     Social History Social History   Tobacco Use   Smoking status: Every Day    Current packs/day: 0.50    Average packs/day: 0.5 packs/day for 20.0 years (10.0 ttl pk-yrs)    Types: Cigarettes   Smokeless tobacco: Never  Vaping Use   Vaping status: Never Used  Substance Use Topics   Alcohol use: Not Currently   Drug use: No   Allergies Ultram [tramadol], Aleve [naproxen], Coconut (cocos nucifera), Depakote [divalproex sodium], Nsaids, and Pineapple  Review of Systems Review of Systems  Musculoskeletal:  Positive for arthralgias and myalgias.  Neurological:  Positive for syncope.    Physical Exam Vital Signs  I have reviewed the triage vital signs BP 104/65   Pulse 61   Temp 98.3 F (36.8 C)   Resp 12   Ht 5' (1.524 m)   Wt 44.3 kg   SpO2 95%   BMI 19.07 kg/m   Physical Exam Vitals and nursing note reviewed.  Constitutional:      General: She is not in acute distress.    Appearance: She is well-developed.  HENT:     Head: Normocephalic.     Comments: Right-sided cheek hematoma Eyes:     Conjunctiva/sclera: Conjunctivae normal.  Cardiovascular:     Rate and Rhythm: Normal rate and regular rhythm.     Heart sounds: No murmur heard. Pulmonary:     Effort: Pulmonary effort is normal. No respiratory distress.     Breath sounds: Normal breath sounds.  Abdominal:     Palpations: Abdomen is soft.     Tenderness: There is no abdominal tenderness.  Musculoskeletal:        General: Tenderness present. No swelling.     Cervical back: Neck supple.  Skin:    General: Skin is warm and dry.     Capillary Refill: Capillary refill takes less than 2 seconds.  Neurological:  Mental Status: She is alert.  Psychiatric:        Mood and Affect: Mood normal.      ED Results and Treatments Labs (all labs ordered are listed, but only abnormal results are displayed) Labs Reviewed  COMPREHENSIVE METABOLIC PANEL - Abnormal; Notable for the following components:      Result Value   Potassium 3.1 (*)    Calcium 8.2 (*)    Total Protein 6.2 (*)    Alkaline Phosphatase 241 (*)    All other components within normal limits  CBC WITH DIFFERENTIAL/PLATELET  URINALYSIS, ROUTINE W REFLEX MICROSCOPIC  RAPID URINE DRUG SCREEN, HOSP PERFORMED  ETHANOL  TROPONIN I (HIGH SENSITIVITY)                                                                                                                          Radiology DG Ankle Complete Left  Result Date: 05/11/2023 CLINICAL DATA:  Recent fall with left ankle pain, initial encounter EXAM: LEFT ANKLE COMPLETE - 3+ VIEW COMPARISON:  None Available. FINDINGS: There is no evidence of fracture, dislocation, or joint effusion. There is no evidence of arthropathy or other focal bone abnormality. Soft tissues are unremarkable. IMPRESSION: No acute abnormality noted. Electronically Signed   By: Alcide Clever M.D.   On: 05/11/2023 01:28   DG Hip Unilat W or Wo Pelvis 2-3 Views Left  Result Date: 05/11/2023 CLINICAL DATA:  Recent fall with left hip pain, initial encounter EXAM: DG HIP (WITH OR WITHOUT PELVIS) 3V LEFT COMPARISON:  None Available. FINDINGS: Pelvic ring is intact. Left hip prosthesis is seen in satisfactory position. No acute fracture or dislocation is noted. No soft tissue changes are seen. IMPRESSION: No acute abnormality noted. Electronically Signed   By: Alcide Clever M.D.   On: 05/11/2023 01:26   DG Humerus Left  Result Date: 05/11/2023 CLINICAL DATA:  Recent fall with arm pain, initial encounter EXAM: LEFT HUMERUS - 2+ VIEW COMPARISON:  None Available. FINDINGS: There is no evidence of fracture or other focal bone lesions. Soft tissues are unremarkable. IMPRESSION: No acute abnormality noted.  Electronically Signed   By: Alcide Clever M.D.   On: 05/11/2023 01:25   CT Head Wo Contrast  Result Date: 05/11/2023 CLINICAL DATA:  Recent fall with headaches and facial pain, initial encounter EXAM: CT HEAD WITHOUT CONTRAST CT MAXILLOFACIAL WITHOUT CONTRAST TECHNIQUE: Multidetector CT imaging of the head and maxillofacial structures were performed using the standard protocol without intravenous contrast. Multiplanar CT image reconstructions of the maxillofacial structures were also generated. RADIATION DOSE REDUCTION: This exam was performed according to the departmental dose-optimization program which includes automated exposure control, adjustment of the mA and/or kV according to patient size and/or use of iterative reconstruction technique. COMPARISON:  None Available. FINDINGS: CT HEAD FINDINGS Brain: No evidence of acute infarction, hemorrhage, hydrocephalus, extra-axial collection or mass lesion/mass effect. Vascular: No hyperdense vessel or unexpected calcification. Skull: Normal. Negative for fracture or focal lesion. Other: None. CT  MAXILLOFACIAL FINDINGS Osseous: No acute bony abnormality is noted. Orbits: Orbits and their contents are within normal limits. Sinuses: Paranasal sinuses are well aerated. Soft tissues: Mild soft tissue swelling is noted along the right cheek with a focal 2.3 x 1.3 cm hematoma identified. IMPRESSION: CT of the head: No acute intracranial abnormality noted. CT of the maxillofacial bones: No acute bony abnormality noted. Right facial soft tissue swelling with small hematoma. Electronically Signed   By: Alcide Clever M.D.   On: 05/11/2023 01:14   CT Maxillofacial Wo Contrast  Result Date: 05/11/2023 CLINICAL DATA:  Recent fall with headaches and facial pain, initial encounter EXAM: CT HEAD WITHOUT CONTRAST CT MAXILLOFACIAL WITHOUT CONTRAST TECHNIQUE: Multidetector CT imaging of the head and maxillofacial structures were performed using the standard protocol without  intravenous contrast. Multiplanar CT image reconstructions of the maxillofacial structures were also generated. RADIATION DOSE REDUCTION: This exam was performed according to the departmental dose-optimization program which includes automated exposure control, adjustment of the mA and/or kV according to patient size and/or use of iterative reconstruction technique. COMPARISON:  None Available. FINDINGS: CT HEAD FINDINGS Brain: No evidence of acute infarction, hemorrhage, hydrocephalus, extra-axial collection or mass lesion/mass effect. Vascular: No hyperdense vessel or unexpected calcification. Skull: Normal. Negative for fracture or focal lesion. Other: None. CT MAXILLOFACIAL FINDINGS Osseous: No acute bony abnormality is noted. Orbits: Orbits and their contents are within normal limits. Sinuses: Paranasal sinuses are well aerated. Soft tissues: Mild soft tissue swelling is noted along the right cheek with a focal 2.3 x 1.3 cm hematoma identified. IMPRESSION: CT of the head: No acute intracranial abnormality noted. CT of the maxillofacial bones: No acute bony abnormality noted. Right facial soft tissue swelling with small hematoma. Electronically Signed   By: Alcide Clever M.D.   On: 05/11/2023 01:14    Pertinent labs & imaging results that were available during my care of the patient were reviewed by me and considered in my medical decision making (see MDM for details).  Medications Ordered in ED Medications  fentaNYL (SUBLIMAZE) injection 50 mcg (50 mcg Intravenous Given 05/11/23 0115)                                                                                                                                     Procedures Procedures  (including critical care time)  Medical Decision Making / ED Course   This patient presents to the ED for concern of syncope, shoulder pain, hip pain, ankle pain, this involves an extensive number of treatment options, and is a complaint that carries with it a  high risk of complications and morbidity.  The differential diagnosis includes orthostatic syncope, cardiogenic syncope, vasovagal syncope, electrolyte abnormality, dehydration, dysrhythmia, vasovagal, Hypoglycemia, Seizure, Autonomic Insufficiency, fracture, hematoma, contusion, rotator cuff injury  MDM: Patient seen emergency room for evaluation of multiple complaints described above.  Physical exam with a right-sided facial hematoma over the cheek, tenderness at  the shoulder on the left, hip on the left and left ankle.  Laboratory evaluation with some mild hypokalemia to 3.1 but is otherwise unremarkable including negative high-sensitivity troponin.  UDS positive for opiates and benzodiazepines of which the patient takes daily as her home medications.  Trauma imaging including CT head, max face, x-ray humerus, hip, ankle largely unremarkable outside of a small facial hematoma on the right.  Suspect rotator cuff injury of the shoulder as patient does have a history of rotator cuff tear in the shoulder and patient placed in a sling for comfort.  Regards to the patient's syncopal episode today, suspect patient likely with orthostatic syncope as they appear to have happened with changes in position.  Concomitant use of both opioids and benzodiazepines may have exacerbated this.  Low suspicion for cardiogenic syncope.  ECG without evidence of Brugada or WPW.  No arrhythmias seen during her stay in the ER today.  Blood pressures little soft when the patient was asleep in the 90s and patient received 1 L lactated Ringer's with improvement.  At this time she does not meet inpatient criteria for admission and will be discharged with outpatient follow-up.  Return precautions given which she voiced understanding  Additional history obtained:  -External records from outside source obtained and reviewed including: Chart review including previous notes, labs, imaging, consultation notes   Lab Tests: -I ordered,  reviewed, and interpreted labs.   The pertinent results include:   Labs Reviewed  COMPREHENSIVE METABOLIC PANEL - Abnormal; Notable for the following components:      Result Value   Potassium 3.1 (*)    Calcium 8.2 (*)    Total Protein 6.2 (*)    Alkaline Phosphatase 241 (*)    All other components within normal limits  CBC WITH DIFFERENTIAL/PLATELET  URINALYSIS, ROUTINE W REFLEX MICROSCOPIC  RAPID URINE DRUG SCREEN, HOSP PERFORMED  ETHANOL  TROPONIN I (HIGH SENSITIVITY)       Imaging Studies ordered: I ordered imaging studies including CT head, max face, x-ray shoulder, hip, ankle I independently visualized and interpreted imaging. I agree with the radiologist interpretation   Medicines ordered and prescription drug management: Meds ordered this encounter  Medications   fentaNYL (SUBLIMAZE) injection 50 mcg    -I have reviewed the patients home medicines and have made adjustments as needed  Critical interventions none    Cardiac Monitoring: The patient was maintained on a cardiac monitor.  I personally viewed and interpreted the cardiac monitored which showed an underlying rhythm of: NSR  Social Determinants of Health:  Factors impacting patients care include: none   Reevaluation: After the interventions noted above, I reevaluated the patient and found that they have :improved  Co morbidities that complicate the patient evaluation  Past Medical History:  Diagnosis Date   Bruising, spontaneous 12/08/2013   Cancer (HCC)    cervical cancer - had hysterectomy   Chronic back pain    Chronic back pain    DDD (degenerative disc disease), lumbar    DDD (degenerative disc disease), lumbar    Fibromyalgia    Headache(784.0)    migraines   Ovarian cyst    Partial tear subscapularis tendon 12/27/2011   Scoliosis    Seizures (HCC)    started 9/12-      Dispostion: I considered admission for this patient, but at this time she does not meet inpatient criteria  for admission and will be discharged with outpatient follow-up     Final Clinical Impression(s) / ED Diagnoses  Final diagnoses:  None     @PCDICTATION @    Glendora Score, MD 05/11/23 [phone]

## 2023-05-11 NOTE — ED Notes (Signed)
Called Jamie in lab to inform ethanol has been added to bloodwork already in lab

## 2023-05-28 ENCOUNTER — Ambulatory Visit: Payer: 59 | Attending: Internal Medicine | Admitting: Internal Medicine

## 2023-05-28 ENCOUNTER — Encounter: Payer: Self-pay | Admitting: Internal Medicine

## 2023-05-28 VITALS — BP 110/72 | HR 72 | Ht 60.0 in | Wt 99.6 lb

## 2023-05-28 DIAGNOSIS — R42 Dizziness and giddiness: Secondary | ICD-10-CM | POA: Diagnosis not present

## 2023-05-28 MED ORDER — MECLIZINE HCL 25 MG PO TABS: 25.0000 mg | ORAL_TABLET | Freq: Three times a day (TID) | ORAL | 0 refills | Status: AC

## 2023-05-28 MED ORDER — DILTIAZEM HCL ER COATED BEADS 180 MG PO CP24: 180.0000 mg | ORAL_CAPSULE | Freq: Every day | ORAL | 3 refills | Status: DC

## 2023-05-28 NOTE — Progress Notes (Signed)
Cardiology Office Note  Date: 05/28/2023   ID: Elby Showers Gist, DOB 01/04/1971, MRN [id number]  PCP:  Elfredia Nevins, MD  Cardiologist:  Marjo Bicker, MD Electrophysiologist:  None    History of Present Illness: Sharon Welch is a 52 y.o. female known to have migraines, anxiety is here for follow-up visit.  Ongoing palpitations associated with chest pain, NM stress test and echocardiogram in 2024 within normal limits.  No relief of palpitations with metoprolol.  She underwent event monitor twice in June and normal 2024 that showed no evidence of malignant arrhythmias or conduction system abnormalities.  Her symptoms correlated with normal sinus rhythm and sinus tachycardia.  She continues to have palpitations, but no chest pain.  She is currently on p.o. Lasix 20 mg once daily, reported improvement in her leg swelling after starting the medication.  She reported having dizziness for a long time, occurs several times per week, describes the sensation as the room spinning feeling.  She even had a fall recently injuring her rotator cuff due to the dizzy spell.  No syncope.  No leg swelling.  Past Medical History:  Diagnosis Date   Bruising, spontaneous 12/08/2013   Cancer (HCC)    cervical cancer - had hysterectomy   Chronic back pain    Chronic back pain    DDD (degenerative disc disease), lumbar    DDD (degenerative disc disease), lumbar    Fibromyalgia    Headache(784.0)    migraines   Ovarian cyst    Partial tear subscapularis tendon 12/27/2011   Scoliosis    Seizures (HCC)    started 9/12-    Past Surgical History:  Procedure Laterality Date   ABDOMINAL HYSTERECTOMY  2012   BACK SURGERY  1989   scoliosis throsic-rods   CESAREAN SECTION     CHOLECYSTECTOMY N/A 11/17/2012   Procedure: LAPAROSCOPIC CHOLECYSTECTOMY WITH INTRAOPERATIVE CHOLANGIOGRAM;  Surgeon: Axel Filler, MD;  Location: [facility];  Service: General;  Laterality: N/A;   COLONOSCOPY WITH PROPOFOL N/A  04/21/2020   Procedure: COLONOSCOPY WITH PROPOFOL;  Surgeon: Jeani Hawking, MD;  Location: [facility];  Service: Endoscopy;  Laterality: N/A;   ENDOSCOPIC RETROGRADE CHOLANGIOPANCREATOGRAPHY (ERCP) WITH PROPOFOL N/A 06/11/2019   Procedure: ENDOSCOPIC RETROGRADE CHOLANGIOPANCREATOGRAPHY (ERCP) WITH PROPOFOL;  Surgeon: Jeani Hawking, MD;  Location: [facility];  Service: Endoscopy;  Laterality: N/A;   ERCP N/A 05/07/2013   Procedure: ENDOSCOPIC RETROGRADE CHOLANGIOPANCREATOGRAPHY (ERCP);  Surgeon: Theda Belfast, MD;  Location: [facility];  Service: Endoscopy;  Laterality: N/A;  start ercp first, if canulation fails switch to EUS    ESOPHAGOGASTRODUODENOSCOPY (EGD) WITH PROPOFOL N/A 05/15/2019   Procedure: ESOPHAGOGASTRODUODENOSCOPY (EGD) WITH PROPOFOL;  Surgeon: West Bali, MD;  [facility];  Service: Endoscopy;  Laterality: N/A;   ESOPHAGOGASTRODUODENOSCOPY (EGD) WITH PROPOFOL N/A 03/03/2020   Procedure: ESOPHAGOGASTRODUODENOSCOPY (EGD) WITH PROPOFOL;  Surgeon: Jeani Hawking, MD;  [facility]: [facility];  Service: Endoscopy;  Laterality: N/A;   ESOPHAGOGASTRODUODENOSCOPY (EGD) WITH PROPOFOL N/A 04/21/2020   Procedure: ESOPHAGOGASTRODUODENOSCOPY (EGD) WITH PROPOFOL;  Surgeon: Jeani Hawking, MD;  [facility]: [facility];  Service: Endoscopy;  Laterality: N/A;   EUS N/A 11/03/2012   Procedure: UPPER ENDOSCOPIC ULTRASOUND (EUS) LINEAR;  Surgeon: Theda Belfast, MD;  Location: [facility];  Service: Endoscopy;  Laterality: N/A;   left arm     NECK SURGERY     POLYPECTOMY  04/21/2020   Procedure: POLYPECTOMY;  Surgeon: Jeani Hawking, MD;  Location: [facility];  Service: Endoscopy;;   REMOVAL OF  STONES  06/11/2019   Procedure: REMOVAL OF STONES;  Surgeon: Jeani Hawking, MD;  Location: [facility];  Service: Endoscopy;;   TOOTH EXTRACTION N/A 05/24/2022   Procedure: DENTAL RESTORATION/EXTRACTIONS;  Surgeon: Ocie Doyne, DMD;  Location: [facility];  Service: Oral Surgery;  Laterality:  N/A;   TOTAL HIP ARTHROPLASTY Left 11/17/2022   Procedure: TOTAL HIP ARTHROPLASTY ANTERIOR APPROACH;  Surgeon: Sheral Apley, MD;  Location: [facility];  Service: Orthopedics;  Laterality: Left;   TUBAL LIGATION     UPPER ESOPHAGEAL ENDOSCOPIC ULTRASOUND (EUS) N/A 06/11/2019   Procedure: UPPER ESOPHAGEAL ENDOSCOPIC ULTRASOUND (EUS);  Surgeon: Jeani Hawking, MD;  Location: [facility];  Service: Endoscopy;  Laterality: N/A;    Current Outpatient Medications  Medication Sig Dispense Refill   albuterol (VENTOLIN HFA) 108 (90 Base) MCG/ACT inhaler Inhale 1-2 puffs into the lungs every 4 (four) hours as needed for wheezing or shortness of breath.     ALPRAZolam (XANAX) 0.25 MG tablet Take 0.25 mg by mouth 4 (four) times daily as needed.     amitriptyline (ELAVIL) 25 MG tablet Take 50 mg by mouth at bedtime.     aspirin EC 81 MG tablet Take 1 tablet (81 mg total) by mouth 2 (two) times daily. To prevent blood clots for 30 days after surgery. 60 tablet 0   Compression Bandages KIT 1 each by Does not apply route daily. 1 kit 0   dexlansoprazole (DEXILANT) 60 MG capsule Take 60 mg by mouth daily.     folic acid (FOLVITE) 1 MG tablet Take 1 mg by mouth daily.     furosemide (LASIX) 20 MG tablet Take 1 tablet (20 mg total) by mouth daily. 90 tablet 3   HYDROcodone-acetaminophen (NORCO) 10-325 MG tablet Take 1 tablet by mouth daily.     lamoTRIgine (LAMICTAL) 150 MG tablet Take 1 tablet (150 mg total) by mouth 2 (two) times daily. 60 tablet 5   linaclotide (LINZESS) 145 MCG CAPS capsule Take 145 mcg by mouth daily before breakfast.     methocarbamol (ROBAXIN) 500 MG tablet Take 500 mg by mouth 3 (three) times daily.     metoprolol tartrate (LOPRESSOR) 25 MG tablet Take 25 mg by mouth 2 (two) times daily.     Milnacipran (SAVELLA) 50 MG TABS tablet Take 50 mg by mouth 2 (two) times daily.     promethazine (PHENERGAN) 25 MG tablet Take 25 mg by mouth every 6 (six) hours as needed for nausea or vomiting.      QULIPTA 60 MG TABS Take 1 tablet by mouth daily.     rizatriptan (MAXALT) 10 MG tablet Take 10 mg by mouth See admin instructions. 10 mg as needed for migraine, may repeat 10 mg in 2 hours if headache persists or recurs.     Vitamin D, Ergocalciferol, (DRISDOL) 1.25 MG (50000 UNIT) CAPS capsule Take 50,000 Units by mouth once a week.     No current facility-administered medications for this visit.   Allergies:  Ultram [tramadol], Aleve [naproxen], Blueberry [vaccinium angustifolium], Coconut (cocos nucifera), Depakote [divalproex sodium], Nsaids, and Pineapple   Social History: The patient  reports that she has been smoking cigarettes. She has a 10 pack-year smoking history. She has never used smokeless tobacco. She reports that she does not currently use alcohol. She reports that she does not use drugs.   Family History: The patient's family history includes Cancer in her father; Heart disease in her father; Hypertension in her brother, brother, father, and mother.  ROS:  Please see the history of present illness. Otherwise, complete review of systems is positive for none  All other systems are reviewed and negative.   Physical Exam: VS:  BP 110/72   Pulse 72   Ht 5' (1.524 m)   Wt 99 lb 9.6 oz (45.2 kg)   SpO2 98%   BMI 19.45 kg/m , BMI Body mass index is 19.45 kg/m.  Wt Readings from Last 3 Encounters:  05/28/23 99 lb 9.6 oz (45.2 kg)  05/11/23 97 lb 10.6 oz (44.3 kg)  04/14/23 97 lb 9.6 oz (44.3 kg)    General: Patient appears comfortable at rest. HEENT: Conjunctiva and lids normal, oropharynx clear with moist mucosa. Neck: Supple, no elevated JVP or carotid bruits, no thyromegaly. Lungs: Clear to auscultation, nonlabored breathing at rest. Cardiac: Regular rate and rhythm, no S3 or significant systolic murmur, no pericardial rub. Abdomen: Soft, nontender, no hepatomegaly, bowel sounds present, no guarding or rebound. Extremities: No pitting edema, distal pulses 2+. Skin:  Warm and dry. Musculoskeletal: No kyphosis. Neuropsychiatric: Alert and oriented x3, affect grossly appropriate.  Recent Labwork: 10/29/2022: TSH 2.050 11/19/2022: Magnesium 2.2 04/11/2023: B Natriuretic Peptide 12.0 05/11/2023: ALT 25; AST 22; BUN 13; Creatinine, Ser 0.70; Hemoglobin 12.1; Platelets 224; Potassium 3.1; Sodium 138  No results found for: "CHOL", "TRIG", "HDL", "CHOLHDL", "VLDL", "LDLCALC", "LDLDIRECT"   Assessment and Plan:  Dizziness likely secondary to vertigo Palpitations Nicotine abuse   -Ongoing chronic dizziness (feels like room spinning around) associated with tinnitus. Will start meclizine 25 mg TID, instructed her to follow-up with PCP for evaluation of inner ear disease.  She underwent event monitor in 12/12/2022 and 05/14/2023 that showed no evidence of arrhythmias or conduction system abnormalities. Patient triggered events correlated with normal sinus rhythm (70 to 161 bpm).  Did not notice any relief with metoprolol, will switch to diltiazem 180 mg once daily. Unclear why she is on p.o. Lasix 20 mg but reported improvement in her leg swelling after starting Lasix, will continue for now.  Cardiac workup within normal limits (echocardiogram and NM stress test from 2024 normal, no ischemia).  She said she cut back on smoking cigarettes, currently smoking half a pack a day (but she reported smoking half a pack a day even in the last clinic visit).  Counseling provided and she is motivated to quit.   Disposition:  Follow up 1 year  Signed Edward Guthmiller Verne Spurr, MD, 05/28/2023 1:01 PM    [facility] at [facility] [address] [place], [place], [place] [address]

## 2023-05-28 NOTE — Patient Instructions (Signed)
Medication Instructions:  Your physician has recommended you make the following change in your medication:  Start taking Meclizine 25 mg three times daily Stop taking Metoprolol Tartrate Start taking Diltiazem 180 mg once daily Continue taking all other medications as prescribed  Labwork: None  Testing/Procedures: None  Follow-Up: Your physician recommends that you schedule a follow-up appointment in: 1 year. You will receive a reminder call in about 8 months reminding you to schedule your appointment. If you don't receive this call, please contact our office.   Any Other Special Instructions Will Be Listed Below (If Applicable).  Thank you for choosing [facility]!      If you need a refill on your cardiac medications before your next appointment, please call your pharmacy.

## 2023-05-29 ENCOUNTER — Encounter: Payer: Self-pay | Admitting: Internal Medicine

## 2023-05-30 ENCOUNTER — Other Ambulatory Visit: Payer: Self-pay

## 2023-05-30 ENCOUNTER — Emergency Department (HOSPITAL_COMMUNITY)
Admission: EM | Admit: 2023-05-30 | Discharge: 2023-05-30 | Disposition: A | Discharge status: Home / Self Care | Payer: 59 | Attending: Emergency Medicine | Admitting: Emergency Medicine

## 2023-05-30 ENCOUNTER — Encounter (HOSPITAL_COMMUNITY): Payer: Self-pay

## 2023-05-30 ENCOUNTER — Emergency Department (HOSPITAL_COMMUNITY): Payer: 59

## 2023-05-30 DIAGNOSIS — J449 Chronic obstructive pulmonary disease, unspecified: Secondary | ICD-10-CM | POA: Insufficient documentation

## 2023-05-30 DIAGNOSIS — Z7982 Long term (current) use of aspirin: Secondary | ICD-10-CM | POA: Diagnosis not present

## 2023-05-30 DIAGNOSIS — R0789 Other chest pain: Secondary | ICD-10-CM | POA: Insufficient documentation

## 2023-05-30 DIAGNOSIS — Z7951 Long term (current) use of inhaled steroids: Secondary | ICD-10-CM | POA: Insufficient documentation

## 2023-05-30 DIAGNOSIS — R000 Tachycardia, unspecified: Secondary | ICD-10-CM | POA: Diagnosis not present

## 2023-05-30 LAB — CBC
HCT: 43.8 % (ref 36.0–46.0)
Hemoglobin: 14.8 g/dL (ref 12.0–15.0)
MCH: 30.7 pg (ref 26.0–34.0)
MCHC: 33.8 g/dL (ref 30.0–36.0)
MCV: 90.9 fL (ref 80.0–100.0)
Platelets: 308 10*3/uL (ref 150–400)
RBC: 4.82 MIL/uL (ref 3.87–5.11)
RDW: 13.1 % (ref 11.5–15.5)
WBC: 12.8 10*3/uL — ABNORMAL HIGH (ref 4.0–10.5)
nRBC: 0 % (ref 0.0–0.2)

## 2023-05-30 LAB — BASIC METABOLIC PANEL
Anion gap: 14 (ref 5–15)
BUN: 10 mg/dL (ref 6–20)
CO2: 24 mmol/L (ref 22–32)
Calcium: 9.9 mg/dL (ref 8.9–10.3)
Chloride: 99 mmol/L (ref 98–111)
Creatinine, Ser: 0.72 mg/dL (ref 0.44–1.00)
GFR, Estimated: >60 mL/min (ref >60)
Glucose, Bld: 104 mg/dL — ABNORMAL HIGH (ref 70–99)
Potassium: 4.3 mmol/L (ref 3.5–5.1)
Sodium: 137 mmol/L (ref 135–145)

## 2023-05-30 LAB — TROPONIN I (HIGH SENSITIVITY)
Troponin I (High Sensitivity): 6 ng/L (ref <18)
Troponin I (High Sensitivity): 7 ng/L (ref <18)

## 2023-05-30 MED ORDER — METOPROLOL TARTRATE 5 MG/5ML IV SOLN
5.0000 mg | Freq: Once | INTRAVENOUS | Status: AC
  Administered 2023-05-30: 5 mg via INTRAVENOUS
  Filled 2023-05-30: qty 5

## 2023-05-30 MED ORDER — LORAZEPAM 2 MG/ML IJ SOLN
0.5000 mg | Freq: Once | INTRAMUSCULAR | Status: AC
  Administered 2023-05-30: 0.5 mg via INTRAVENOUS
  Filled 2023-05-30: qty 1

## 2023-05-30 MED ORDER — OXYCODONE-ACETAMINOPHEN 5-325 MG PO TABS: 1.0000 | ORAL_TABLET | Freq: Four times a day (QID) | ORAL | 0 refills | Status: DC | PRN

## 2023-05-30 MED ORDER — SODIUM CHLORIDE 0.9 % IV BOLUS
1000.0000 mL | Freq: Once | INTRAVENOUS | Status: AC
Start: 2023-05-30 — End: 2023-05-30
  Administered 2023-05-30: 1000 mL via INTRAVENOUS

## 2023-05-30 NOTE — ED Provider Notes (Signed)
[facility] AT [facility] Provider Note   CSN: [id number] Arrival date & time: 05/30/23  1824     History {Add pertinent medical, surgical, social history, OB history to HPI:1} Chief Complaint  Patient presents with   Chest Pain   Shortness of Breath   Shoulder Pain   Tachycardia    Sharon Welch is a 52 y.o. female.  Patient complains of left-sided chest pain worse with movement.  Patient has a COPD   Chest Pain Associated symptoms: shortness of breath   Shortness of Breath Associated symptoms: chest pain   Shoulder Pain      Home Medications Prior to Admission medications   Medication Sig Start Date End Date Taking? Authorizing Provider  oxyCODONE-acetaminophen (PERCOCET/ROXICET) 5-325 MG tablet Take 1 tablet by mouth every 6 (six) hours as needed for severe pain (pain score 7-10). 05/30/23  Yes Bethann Berkshire, MD  albuterol (VENTOLIN HFA) 108 (90 Base) MCG/ACT inhaler Inhale 1-2 puffs into the lungs every 4 (four) hours as needed for wheezing or shortness of breath.    [provider]  ALPRAZolam Prudy Feeler) 0.25 MG tablet Take 0.25 mg by mouth 4 (four) times daily as needed. 01/23/23   [provider]  amitriptyline (ELAVIL) 25 MG tablet Take 50 mg by mouth at bedtime.    [provider]  aspirin EC 81 MG tablet Take 1 tablet (81 mg total) by mouth 2 (two) times daily. To prevent blood clots for 30 days after surgery. 11/19/22   Jenne Pane, PA-C  Compression Bandages KIT 1 each by Does not apply route daily. 04/14/23   Sharlene Dory, NP  dexlansoprazole (DEXILANT) 60 MG capsule Take 60 mg by mouth daily.    [provider]  diltiazem (CARDIZEM CD) 180 MG 24 hr capsule Take 1 capsule (180 mg total) by mouth daily. 05/28/23   Mallipeddi, Vishnu P, MD  folic acid (FOLVITE) 1 MG tablet Take 1 mg by mouth daily. 04/24/23   [provider]  furosemide (LASIX) 20 MG tablet Take 1 tablet (20 mg total) by  mouth daily. 01/09/23 05/27/97  Mallipeddi, Vishnu P, MD  HYDROcodone-acetaminophen (NORCO) 10-325 MG tablet Take 1 tablet by mouth daily. 03/10/23   [provider]  lamoTRIgine (LAMICTAL) 150 MG tablet Take 1 tablet (150 mg total) by mouth 2 (two) times daily. 03/19/23   Ihor Austin, NP  linaclotide (LINZESS) 145 MCG CAPS capsule Take 145 mcg by mouth daily before breakfast.    [provider]  meclizine (ANTIVERT) 25 MG tablet Take 1 tablet (25 mg total) by mouth 3 (three) times daily. 05/28/23   Mallipeddi, Vishnu P, MD  methocarbamol (ROBAXIN) 500 MG tablet Take 500 mg by mouth 3 (three) times daily. 03/28/23   [provider]  Milnacipran (SAVELLA) 50 MG TABS tablet Take 50 mg by mouth 2 (two) times daily.    [provider]  promethazine (PHENERGAN) 25 MG tablet Take 25 mg by mouth every 6 (six) hours as needed for nausea or vomiting.    [provider]  QULIPTA 60 MG TABS Take 1 tablet by mouth daily. 04/28/23   [provider]  rizatriptan (MAXALT) 10 MG tablet Take 10 mg by mouth See admin instructions. 10 mg as needed for migraine, may repeat 10 mg in 2 hours if headache persists or recurs.    [provider]  Vitamin D, Ergocalciferol, (DRISDOL) 1.25 MG (50000 UNIT) CAPS capsule Take 50,000 Units by mouth once a  week. 04/24/23   [provider]      Allergies    Ultram [tramadol], Aleve [naproxen], Blueberry [vaccinium angustifolium], Coconut (cocos nucifera), Depakote [divalproex sodium], Nsaids, and Pineapple    Review of Systems   Review of Systems  Respiratory:  Positive for shortness of breath.   Cardiovascular:  Positive for chest pain.    Physical Exam Updated Vital Signs BP 124/73   Pulse 92   Temp 98.2 F (36.8 C) (Oral)   Resp (!) 22   Ht 5' (1.524 m)   Wt 45.7 kg   SpO2 99%   BMI 19.69 kg/m  Physical Exam  ED Results / Procedures / Treatments   Labs (all labs ordered are listed, but only  abnormal results are displayed) Labs Reviewed  BASIC METABOLIC PANEL - Abnormal; Notable for the following components:      Result Value   Glucose, Bld 104 (*)    All other components within normal limits  CBC - Abnormal; Notable for the following components:   WBC 12.8 (*)    All other components within normal limits  TROPONIN I (HIGH SENSITIVITY)  TROPONIN I (HIGH SENSITIVITY)    EKG EKG Interpretation Date/Time:  Friday May 30 2023 19:22:29 EST Ventricular Rate:  123 PR Interval:  140 QRS Duration:  72 QT Interval:  330 QTC Calculation: 472 R Axis:   -87  Text Interpretation: Sinus tachycardia with Fusion complexes Left anterior fascicular block Cannot rule out Anterior infarct , age undetermined Abnormal ECG When compared with ECG of 11-May-2023 01:18, PREVIOUS ECG IS PRESENT Confirmed by Bethann Berkshire [phone]) on 05/30/2023 10:58:55 PM  Radiology DG Chest 2 View  Result Date: 05/30/2023 CLINICAL DATA:  SOB chronic cough EXAM: CHEST - 2 VIEW COMPARISON:  12/15/2022. FINDINGS: The heart size and mediastinal contours are within normal limits. Lungs are hyperinflated suggesting COPD. There is no focal consolidation. No pneumothorax or pleural effusion. There is spine fixation hardware from midthoracic spine to the lumbar spine below the confines of the x-ray. IMPRESSION: Findings suggest COPD.  Otherwise no acute cardiopulmonary disease. Electronically Signed   By: Layla Maw M.D.   On: 05/30/2023 19:58    Procedures Procedures  {Document cardiac monitor, telemetry assessment procedure when appropriate:1}  Medications Ordered in ED Medications  sodium chloride 0.9 % bolus 1,000 mL (0 mLs Intravenous Stopped 05/30/23 2120)  LORazepam (ATIVAN) injection 0.5 mg (0.5 mg Intravenous Given 05/30/23 2106)  metoprolol tartrate (LOPRESSOR) injection 5 mg (5 mg Intravenous Given 05/30/23 2109)    ED Course/ Medical Decision Making/ A&P   {   Click here for ABCD2, HEART and  other calculatorsREFRESH Note before signing :1}                              Medical Decision Making Amount and/or Complexity of Data Reviewed Labs: ordered. Radiology: ordered.  Risk Prescription drug management.   Patient with left-sided chest pain worse with movement.  Troponins are normal.  Patient with chest wall pain.  She will follow-up with her doctor next week  {Document critical care time when appropriate:1} {Document review of labs and clinical decision tools ie heart score, Chads2Vasc2 etc:1}  {Document your independent review of radiology images, and any outside records:1} {Document your discussion with family members, caretakers, and with consultants:1} {Document social determinants of health affecting pt's care:1} {Document your decision making why or why not admission, treatments were needed:1} Final Clinical Impression(s) / ED  Diagnoses Final diagnoses:  Chest wall pain    Rx / DC Orders ED Discharge Orders          Ordered    oxyCODONE-acetaminophen (PERCOCET/ROXICET) 5-325 MG tablet  Every 6 hours PRN        05/30/23 2310

## 2023-05-30 NOTE — Discharge Instructions (Signed)
Follow-up with your family doctor next week for recheck. 

## 2023-05-30 NOTE — ED Triage Notes (Signed)
Pt reports:  Elevated Heart Rate 120-130 Chest Pain Started today SOB Started today Bilateral Arm Weakness Started today Shoulder Pain Started today

## 2023-05-31 ENCOUNTER — Other Ambulatory Visit: Payer: Self-pay | Admitting: Internal Medicine

## 2023-06-09 ENCOUNTER — Other Ambulatory Visit: Payer: Self-pay | Admitting: Gastroenterology

## 2023-06-09 DIAGNOSIS — R748 Abnormal levels of other serum enzymes: Secondary | ICD-10-CM

## 2023-07-02 ENCOUNTER — Other Ambulatory Visit: Payer: 59

## 2023-07-07 ENCOUNTER — Ambulatory Visit
Admission: RE | Admit: 2023-07-07 | Discharge: 2023-07-07 | Disposition: A | Discharge status: Home / Self Care | Payer: 59 | Source: Ambulatory Visit | Attending: Gastroenterology | Admitting: Gastroenterology

## 2023-07-07 DIAGNOSIS — R748 Abnormal levels of other serum enzymes: Secondary | ICD-10-CM

## 2023-07-07 MED ORDER — GADOPICLENOL 0.5 MMOL/ML IV SOLN
5.0000 mL | Freq: Once | INTRAVENOUS | Status: AC | PRN
  Administered 2023-07-07: 5 mL via INTRAVENOUS

## 2023-07-28 ENCOUNTER — Other Ambulatory Visit: Payer: Self-pay | Admitting: Orthopedic Surgery

## 2023-07-28 DIAGNOSIS — G8929 Other chronic pain: Secondary | ICD-10-CM

## 2023-07-30 ENCOUNTER — Ambulatory Visit
Admission: RE | Admit: 2023-07-30 | Discharge: 2023-07-30 | Disposition: A | Discharge status: Home / Self Care | Payer: 59 | Source: Ambulatory Visit | Attending: Orthopedic Surgery | Admitting: Orthopedic Surgery

## 2023-07-30 DIAGNOSIS — G8929 Other chronic pain: Secondary | ICD-10-CM

## 2023-08-02 ENCOUNTER — Emergency Department (HOSPITAL_COMMUNITY): Payer: 59

## 2023-08-02 ENCOUNTER — Emergency Department (HOSPITAL_COMMUNITY)
Admission: EM | Admit: 2023-08-02 | Discharge: 2023-08-02 | Disposition: A | Discharge status: Home / Self Care | Payer: 59 | Attending: Emergency Medicine | Admitting: Emergency Medicine

## 2023-08-02 DIAGNOSIS — K529 Noninfective gastroenteritis and colitis, unspecified: Secondary | ICD-10-CM | POA: Insufficient documentation

## 2023-08-02 DIAGNOSIS — Z7982 Long term (current) use of aspirin: Secondary | ICD-10-CM | POA: Insufficient documentation

## 2023-08-02 DIAGNOSIS — R1032 Left lower quadrant pain: Secondary | ICD-10-CM | POA: Diagnosis present

## 2023-08-02 LAB — COMPREHENSIVE METABOLIC PANEL
ALT: 52 U/L — ABNORMAL HIGH (ref 0–44)
AST: 49 U/L — ABNORMAL HIGH (ref 15–41)
Albumin: 4.6 g/dL (ref 3.5–5.0)
Alkaline Phosphatase: 387 U/L — ABNORMAL HIGH (ref 38–126)
Anion gap: 13 (ref 5–15)
BUN: 11 mg/dL (ref 6–20)
CO2: 28 mmol/L (ref 22–32)
Calcium: 10 mg/dL (ref 8.9–10.3)
Chloride: 98 mmol/L (ref 98–111)
Creatinine, Ser: 0.8 mg/dL (ref 0.44–1.00)
GFR, Estimated: >60 mL/min (ref >60)
Glucose, Bld: 138 mg/dL — ABNORMAL HIGH (ref 70–99)
Potassium: 4.9 mmol/L (ref 3.5–5.1)
Sodium: 139 mmol/L (ref 135–145)
Total Bilirubin: 1.1 mg/dL (ref 0.0–1.2)
Total Protein: 8.3 g/dL — ABNORMAL HIGH (ref 6.5–8.1)

## 2023-08-02 LAB — URINALYSIS, ROUTINE W REFLEX MICROSCOPIC
Bacteria, UA: NONE SEEN
Bilirubin Urine: NEGATIVE
Glucose, UA: NEGATIVE mg/dL
Ketones, ur: NEGATIVE mg/dL
Leukocytes,Ua: NEGATIVE
Nitrite: NEGATIVE
Protein, ur: NEGATIVE mg/dL
Specific Gravity, Urine: 1.018 (ref 1.005–1.030)
pH: 6 (ref 5.0–8.0)

## 2023-08-02 LAB — CBC
HCT: 50.1 % — ABNORMAL HIGH (ref 36.0–46.0)
Hemoglobin: 16.7 g/dL — ABNORMAL HIGH (ref 12.0–15.0)
MCH: 31.2 pg (ref 26.0–34.0)
MCHC: 33.3 g/dL (ref 30.0–36.0)
MCV: 93.6 fL (ref 80.0–100.0)
Platelets: 370 10*3/uL (ref 150–400)
RBC: 5.35 MIL/uL — ABNORMAL HIGH (ref 3.87–5.11)
RDW: 12.3 % (ref 11.5–15.5)
WBC: 16.6 10*3/uL — ABNORMAL HIGH (ref 4.0–10.5)
nRBC: 0 % (ref 0.0–0.2)

## 2023-08-02 LAB — LIPASE, BLOOD: Lipase: 29 U/L (ref 11–51)

## 2023-08-02 MED ORDER — LIDOCAINE VISCOUS HCL 2 % MT SOLN
15.0000 mL | Freq: Once | OROMUCOSAL | Status: AC
  Administered 2023-08-02: 15 mL via ORAL
  Filled 2023-08-02: qty 15

## 2023-08-02 MED ORDER — AMOXICILLIN-POT CLAVULANATE 875-125 MG PO TABS: 1.0000 | ORAL_TABLET | Freq: Two times a day (BID) | ORAL | 0 refills | Status: DC

## 2023-08-02 MED ORDER — MORPHINE SULFATE (PF) 4 MG/ML IV SOLN
2.0000 mg | Freq: Once | INTRAVENOUS | Status: AC
  Administered 2023-08-02: 2 mg via INTRAVENOUS
  Filled 2023-08-02: qty 1

## 2023-08-02 MED ORDER — ALUM & MAG HYDROXIDE-SIMETH 200-200-20 MG/5ML PO SUSP
30.0000 mL | Freq: Once | ORAL | Status: AC
  Administered 2023-08-02: 30 mL via ORAL
  Filled 2023-08-02: qty 30

## 2023-08-02 MED ORDER — DICYCLOMINE HCL 20 MG PO TABS: 20.0000 mg | ORAL_TABLET | Freq: Two times a day (BID) | ORAL | 0 refills | Status: DC

## 2023-08-02 MED ORDER — LOPERAMIDE HCL 2 MG PO CAPS
4.0000 mg | ORAL_CAPSULE | Freq: Once | ORAL | Status: AC
  Administered 2023-08-02: 4 mg via ORAL
  Filled 2023-08-02: qty 2

## 2023-08-02 MED ORDER — IOHEXOL 300 MG/ML  SOLN: 30.0000 mL | Freq: Once | INTRAMUSCULAR | Status: DC | PRN

## 2023-08-02 MED ORDER — IOHEXOL 300 MG/ML  SOLN
80.0000 mL | Freq: Once | INTRAMUSCULAR | Status: AC | PRN
  Administered 2023-08-02: 80 mL via INTRAVENOUS

## 2023-08-02 MED ORDER — ONDANSETRON HCL 4 MG/2ML IJ SOLN
4.0000 mg | Freq: Once | INTRAMUSCULAR | Status: AC
  Administered 2023-08-02: 4 mg via INTRAVENOUS
  Filled 2023-08-02: qty 2

## 2023-08-02 MED ORDER — AMOXICILLIN-POT CLAVULANATE 875-125 MG PO TABS
1.0000 | ORAL_TABLET | Freq: Once | ORAL | Status: AC
  Administered 2023-08-02: 1 via ORAL
  Filled 2023-08-02: qty 1

## 2023-08-02 NOTE — ED Provider Notes (Signed)
 [place] [facility] AT [facility] Provider Note   CSN: [id number] Arrival date & time: 08/02/23  1342     History  Chief Complaint  Patient presents with   Abdominal Pain    Sharon Welch is a 53 y.o. female past medical history of peptic ulcer disease, degenerative disc disease, seizure presenting to emergency room with left lower abdominal pain started yesterday.  Patient reports that 2 days ago she had a normal bowel movement and then started having diarrhea today.  Reports she has had 2 total episodes of loose stool.  Reports she has not noticed any blood in the stool or dark tarry stools.  Reports associated nausea but denies vomiting.  Reports antibiotic use for sinus infection over a month ago. Denies any headache, weakness, numbness or tingling.  Denies any chest pain, cough, shortness of breath.  No recent travel.  No sign of systemic illness.   Abdominal Pain      Home Medications Prior to Admission medications   Medication Sig Start Date End Date Taking? Authorizing Provider  albuterol  (VENTOLIN  HFA) 108 (90 Base) MCG/ACT inhaler Inhale 1-2 puffs into the lungs every 4 (four) hours as needed for wheezing or shortness of breath.    [provider]  ALPRAZolam  (XANAX ) 0.25 MG tablet Take 0.25 mg by mouth 4 (four) times daily as needed. 01/23/23   [provider]  amitriptyline  (ELAVIL ) 25 MG tablet Take 50 mg by mouth at bedtime.    [provider]  aspirin  EC 81 MG tablet Take 1 tablet (81 mg total) by mouth 2 (two) times daily. To prevent blood clots for 30 days after surgery. 11/19/22   Ted Gerard HERO, PA-C  Compression Bandages KIT 1 each by Does not apply route daily. 04/14/23   Miriam Norris, NP  dexlansoprazole (DEXILANT) 60 MG capsule Take 60 mg by mouth daily.    [provider]  diltiazem  (CARDIZEM  CD) 180 MG 24 hr capsule Take 1 capsule (180 mg total) by mouth daily. 05/28/23   Mallipeddi, Vishnu P, MD   folic acid  (FOLVITE ) 1 MG tablet Take 1 mg by mouth daily. 04/24/23   [provider]  furosemide  (LASIX ) 20 MG tablet Take 1 tablet (20 mg total) by mouth daily. 01/09/23 05/27/97  Mallipeddi, Vishnu P, MD  HYDROcodone -acetaminophen  (NORCO) 10-325 MG tablet Take 1 tablet by mouth daily. 03/10/23   [provider]  lamoTRIgine  (LAMICTAL ) 150 MG tablet Take 1 tablet (150 mg total) by mouth 2 (two) times daily. 03/19/23   Whitfield Raisin, NP  linaclotide  (LINZESS ) 145 MCG CAPS capsule Take 145 mcg by mouth daily before breakfast.    [provider]  meclizine  (ANTIVERT ) 25 MG tablet Take 1 tablet (25 mg total) by mouth 3 (three) times daily. 05/28/23   Mallipeddi, Vishnu P, MD  methocarbamol  (ROBAXIN ) 500 MG tablet Take 500 mg by mouth 3 (three) times daily. 03/28/23   [provider]  Milnacipran  (SAVELLA ) 50 MG TABS tablet Take 50 mg by mouth 2 (two) times daily.    [provider]  oxyCODONE -acetaminophen  (PERCOCET/ROXICET) 5-325 MG tablet Take 1 tablet by mouth every 6 (six) hours as needed for severe pain (pain score 7-10). 05/30/23   Suzette Pac, MD  promethazine  (PHENERGAN ) 25 MG tablet Take 25 mg by mouth every 6 (six) hours as needed for nausea or vomiting.    [provider]  QULIPTA  60 MG TABS Take 1 tablet by mouth daily. 04/28/23   [provider]  rizatriptan (MAXALT) 10 MG tablet Take 10 mg by mouth See admin instructions. 10 mg as needed for migraine, may repeat 10 mg in 2 hours if headache persists or recurs.    [provider]  Vitamin D , Ergocalciferol , (DRISDOL ) 1.25 MG (50000 UNIT) CAPS capsule Take 50,000 Units by mouth once a week. 04/24/23   [provider]      Allergies    Ultram [tramadol], Aleve [naproxen], Blueberry [vaccinium angustifolium], Coconut (cocos nucifera), Depakote  [divalproex  sodium], Nsaids, and Pineapple    Review of Systems   Review of Systems  Gastrointestinal:  Positive for  abdominal pain.    Physical Exam Updated Vital Signs BP (!) 115/95   Pulse (!) 118   Temp 97.8 F (36.6 C) (Oral)   Resp 16   SpO2 99%  Physical Exam Vitals and nursing note reviewed.  Constitutional:      General: She is not in acute distress.    Appearance: She is not toxic-appearing.  HENT:     Head: Normocephalic and atraumatic.  Eyes:     General: No scleral icterus.    Conjunctiva/sclera: Conjunctivae normal.  Cardiovascular:     Rate and Rhythm: Normal rate and regular rhythm.     Pulses: Normal pulses.     Heart sounds: Normal heart sounds.  Pulmonary:     Effort: Pulmonary effort is normal. No respiratory distress.     Breath sounds: Normal breath sounds.  Abdominal:     General: Abdomen is flat. Bowel sounds are normal. There is no distension.     Palpations: Abdomen is soft. There is no mass.     Tenderness: There is abdominal tenderness. There is no right CVA tenderness or left CVA tenderness.  Musculoskeletal:     Right lower leg: No edema.     Left lower leg: No edema.  Skin:    General: Skin is warm and dry.     Findings: No lesion.  Neurological:     General: No focal deficit present.     Mental Status: She is alert and oriented to person, place, and time. Mental status is at baseline.     ED Results / Procedures / Treatments   Labs (all labs ordered are listed, but only abnormal results are displayed) Labs Reviewed  COMPREHENSIVE METABOLIC PANEL - Abnormal; Notable for the following components:      Result Value   Glucose, Bld 138 (*)    Total Protein 8.3 (*)    AST 49 (*)    ALT 52 (*)    Alkaline Phosphatase 387 (*)    All other components within normal limits  CBC - Abnormal; Notable for the following components:   WBC 16.6 (*)    RBC 5.35 (*)    Hemoglobin 16.7 (*)    HCT 50.1 (*)    All other components within normal limits  LIPASE, BLOOD  URINALYSIS, ROUTINE W REFLEX MICROSCOPIC    EKG None  Radiology CT ABDOMEN PELVIS W  CONTRAST Result Date: 08/02/2023 CLINICAL DATA:  Left lower quadrant abdominal pain, nausea EXAM: CT ABDOMEN AND PELVIS WITH CONTRAST TECHNIQUE: Multidetector CT imaging of the abdomen and pelvis was performed using the standard protocol following bolus administration of intravenous contrast. RADIATION DOSE REDUCTION: This exam was performed according to the departmental dose-optimization program which includes automated exposure control, adjustment of the mA and/or kV according to patient size and/or use of iterative reconstruction technique. CONTRAST:  80mL OMNIPAQUE  IOHEXOL  300 MG/ML  SOLN COMPARISON:  05/15/2019 CT abdomen/pelvis FINDINGS: Lower chest: Moderate centrilobular emphysema at the lung bases. No acute abnormality at the lung bases. Oral contrast in the lower thoracic esophagus. Hepatobiliary: Normal liver size. No liver mass. Cholecystectomy. Mild diffuse intrahepatic biliary ductal dilatation, increased from prior CT. Dilated common bile duct (18 mm diameter, previously 11 mm), increased. Pancreas: Chronic mild diffuse main pancreatic duct dilation (3 mm diameter), not appreciably changed. No discrete pancreatic mass. Spleen: Normal size. No mass. Adrenals/Urinary Tract: Normal adrenals. No hydronephrosis. Hypodense 1.0 cm interpolar left renal cortical lesion is unchanged and considered benign. No new renal lesions. Normal bladder. Stomach/Bowel: Normal non-distended stomach. Normal caliber small bowel with no small bowel wall thickening. Oral contrast transits to the mid small bowel. Normal appendix. Fluid levels throughout the colon. Mild colonic wall thickening throughout the nondistended transverse and left colon. No diverticulosis or significant pericolonic fat stranding. Vascular/Lymphatic: Normal caliber abdominal aorta. Patent portal, splenic, hepatic and renal veins. No pathologically enlarged lymph nodes in the abdomen or pelvis. Reproductive: Status post hysterectomy, with no abnormal  findings at the vaginal cuff. No adnexal mass. Other: No pneumoperitoneum, ascites or focal fluid collection. Musculoskeletal: No aggressive appearing focal osseous lesions. Left total hip arthroplasty. Bilateral posterior spinal fusion hardware extending inferiorly to the L4 level. Partially visualized long segment levocurvature of the thoracolumbar spine. IMPRESSION: 1. CT findings suggestive of mild colitis involving the transverse and left colon, with fluid levels throughout the colon. No bowel obstruction or free air. 2. Diffuse intrahepatic and extrahepatic biliary ductal dilatation, increased from 2020 comparison CT. Findings may be due to chronic post-cholecystectomy reservoir effect, however suggest correlation with serum bilirubin levels. MRI abdomen/MRCP may be obtained as clinically warranted. 3. Oral contrast in the lower thoracic esophagus, suggesting esophageal dysmotility and/or gastroesophageal reflux. 4.  Emphysema (ICD10-J43.9). Electronically Signed   By: Selinda DELENA Blue M.D.   On: 08/02/2023 18:00   DG Chest 2 View Result Date: 08/02/2023 CLINICAL DATA:  Cough upper abdominal pain EXAM: CHEST - 2 VIEW COMPARISON:  05/30/2019 FINDINGS: The heart size and mediastinal contours are within normal limits. Emphysema and pulmonary hyperinflation. Lumbar and thoracic fusion. IMPRESSION: Emphysema and pulmonary hyperinflation. No acute abnormality of the lungs. Electronically Signed   By: Marolyn JONETTA Jaksch M.D.   On: 08/02/2023 16:27    Procedures Procedures    Medications Ordered in ED Medications - No data to display  ED Course/ Medical Decision Making/ A&P                                 Medical Decision Making Amount and/or Complexity of Data Reviewed Labs: ordered. Radiology: ordered.  Risk OTC drugs. Prescription drug management.   Jenkins Vannie Lesches 53 y.o. presented today for abd pain. Working DDx includes, but not limited to, gastroenteritis, colitis, SBO, appendicitis,  cholecystitis, hepatobiliary pathology, gastritis, PUD, ACS, dissection, pancreatitis, nephrolithiasis, AAA, UTI, pyelonephritis.  R/o DDx: These are considered less likely than current impression due to history of present illness, physical exam, labs/imaging findings.  Review of prior external notes: Reviewed 07/07/2023 imaging  Pmhx: Cholecystectomy, transaminitis  Unique Tests and My Interpretation:  CBC with leukocytosis, hemoglobin is 16.7.  Lipase within normal limits.  CMP without significant electrolyte abnormality.  No anion gap.  No AKI.  AST and ALT are slightly elevated.  Alk phos elevated.  Both of these are similar to prior recent labs and being monitored by GI.  Ordered stool samples however  unable to obtain.  UA without any urinary tract infection.  Imaging:  CT Abd/Pelvis with contrast: evaluate for structural/surgical etiology of patients' severe abdominal pain -showing mild colitis as well as biliary tract dilation.  Patient is aware of biliary tract dilation and has already had MRI and MRCP with GI doctor. Patient reporting follow up is scheduled in regards to this finding.  Chest x-ray without acute findings.   Problem List / ED Course / Critical interventions / Medication management  Patient reporting to emergency room with left lower quadrant abdominal pain.  Patient reports associated diarrhea consistent with colitis.  She is hemodynamically stable with no fever and no tachycardia.  She is tolerating oral intake.  Patient is well-appearing.  Her labs show mild leukocytosis and given colitis findings will opt to treat with empiric antibiotics.  Patient was unable to leave a stool sample during stay.  Patient also has chronic elevation of AST, ALT and alkaline phos as well as biliary dilation which are being monitored closely by GI doctor. Patient does not have RUQ pain at this time, but was advised on findings.  Patient feeling better after morphine , Zofran  and GI cocktail.   Patient passed p.o. challenge and is requesting to be discharged.  Will give first dose of antibiotics in the emergency room prior to discharge. I ordered medication including morphine , Zofran , GI cocktail Reevaluation of the patient after these medicines showed that the patient improved Patients vitals assessed. Upon arrival patient is hemodynamically stable.  I have reviewed the patients home medicines and have made adjustments as needed   Consult: None   Plan:  Symptomatic management.  Antibiotics for colitis.  Bland and liquid diet.  Given strict return precautions.         Final Clinical Impression(s) / ED Diagnoses Final diagnoses:  Colitis    Rx / DC Orders ED Discharge Orders     None         Shermon Warren SAILOR, PA-C 08/02/23 2027    Elnor Savant A, DO 08/03/23 1541

## 2023-08-02 NOTE — Discharge Instructions (Addendum)
 You are seen in the emergency room today for abdominal pain.  Your imaging shows biliary tract dilation as discussed.  This is consistent with findings on MRI that you had earlier with your GI doctor.  Imaging also shows signs of colitis. I have prescribed antibiotics. I have also prescribed bentyl  to take as needed for stomach pain. You can take Imodium  as needed for diarrhea, but as discussed try to limit use. I recommend bland diet for the next 2 to 3 days.  Please follow-up with your primary care doctor.  Return to emergency room if you have any new or worsening symptoms.

## 2023-08-02 NOTE — ED Notes (Signed)
 Pt assisted to bathroom x2. Pt currently in bathroom.

## 2023-08-02 NOTE — ED Triage Notes (Signed)
 Pt c/o LLQ abd pain that started earlier today. Last BM 1-2 days ago and pt states it was diarrhea. No hx of diverticulitis per pt. Pt endorses nausea without vomiting.

## 2023-08-25 ENCOUNTER — Telehealth: Payer: Self-pay | Admitting: Adult Health

## 2023-08-25 NOTE — Telephone Encounter (Signed)
 Pt called stating that her Orthopedic Surgeon,  Dr. Shelba Flake office sent over clearance forms for the pt in February but they have not heard back. Pt is wondering if it is because they may have sent it under her now legal name Sharon Welch. Please advise.

## 2023-08-26 NOTE — Telephone Encounter (Signed)
 Contacted pt back, LVM per DPR, informing her we currently do not have a clearance request for her.  It could be because they sent it with a different name.  Advised her to request they resend it to [place] with both her last names on it and date of birth and we will get it taken care of for her.  Advised to call the office back with any additional questions or concerns.

## 2023-08-27 NOTE — Telephone Encounter (Signed)
 Patient is being followed for chronic migraines and seizure disorder.  She was previously seen in the office 02/2023.  As long as these conditions have been stable, okay to proceed with surgery as requested. Would recommend patient still take medications for both seizures and migraines the morning of procedure with small sip of water. Thank you.

## 2023-08-27 NOTE — Telephone Encounter (Signed)
 Placed surgery clearance in Jessica,NP box for review

## 2023-09-02 ENCOUNTER — Telehealth: Payer: Self-pay

## 2023-09-02 NOTE — Telephone Encounter (Signed)
 Surgcal clearance received, patient name on request wrong. Call Delbert Harness and advised new clearance request needed with correct patient name. Verified patient MRN, full name, and address. Asked Tresa Endo to discard previous form sent and refax. She is in agreement .Patient last saw Ihor Austin

## 2023-09-04 NOTE — Telephone Encounter (Signed)
 Signed and placed in outbox.  Thank you. ?

## 2023-09-04 NOTE — Telephone Encounter (Signed)
 Received another clearance form where they have corrected the patient's name. Will place in Jessica's inbox to sign again and sent to them

## 2023-09-04 NOTE — Telephone Encounter (Signed)
 Faxed the form to the MD office and received confirmation

## 2023-09-14 ENCOUNTER — Other Ambulatory Visit: Payer: Self-pay | Admitting: Internal Medicine

## 2023-09-21 ENCOUNTER — Other Ambulatory Visit: Payer: Self-pay | Admitting: Adult Health

## 2023-09-23 ENCOUNTER — Telehealth: Payer: Self-pay | Admitting: Adult Health

## 2023-09-23 ENCOUNTER — Other Ambulatory Visit: Payer: Self-pay

## 2023-09-23 MED ORDER — QULIPTA 60 MG PO TABS: 1.0000 | ORAL_TABLET | Freq: Every day | ORAL | 0 refills | Status: DC

## 2023-09-23 NOTE — Telephone Encounter (Signed)
Rx refill request has been sent

## 2023-09-23 NOTE — Telephone Encounter (Signed)
 Pt request refill for QULIPTA 60 MG TABS  send to [facility]

## 2023-10-02 ENCOUNTER — Ambulatory Visit: Payer: 59 | Admitting: Adult Health

## 2023-10-02 ENCOUNTER — Encounter: Payer: Self-pay | Admitting: Adult Health

## 2023-10-02 VITALS — BP 129/81 | HR 67 | Ht 60.0 in | Wt 104.0 lb

## 2023-10-02 DIAGNOSIS — G40909 Epilepsy, unspecified, not intractable, without status epilepticus: Secondary | ICD-10-CM

## 2023-10-02 DIAGNOSIS — G43709 Chronic migraine without aura, not intractable, without status migrainosus: Secondary | ICD-10-CM | POA: Diagnosis not present

## 2023-10-02 DIAGNOSIS — Z5181 Encounter for therapeutic drug level monitoring: Secondary | ICD-10-CM | POA: Diagnosis not present

## 2023-10-02 MED ORDER — QULIPTA 60 MG PO TABS: 1.0000 | ORAL_TABLET | Freq: Every day | ORAL | 11 refills | Status: AC

## 2023-10-02 MED ORDER — LEVETIRACETAM 500 MG PO TABS: 500.0000 mg | ORAL_TABLET | Freq: Two times a day (BID) | ORAL | 5 refills | Status: DC

## 2023-10-02 MED ORDER — LAMOTRIGINE 150 MG PO TABS: 150.0000 mg | ORAL_TABLET | Freq: Two times a day (BID) | ORAL | 11 refills | Status: DC

## 2023-10-02 NOTE — Patient Instructions (Addendum)
 Your Plan:  Recommend starting Keppra 500mg  twice daily for seizure prevention  Continue lamotrigine 150mg  twice daily for seizure prevention   Continue Qulipta 60mg  nightly for migraine prevention  Continue Maxalt (rizatriptan) as needed for migraine rescue     Follow up in with Dr. Terrace Arabia in 4-6 months or call earlier if needed      Thank you for coming to see [place] at [facility]. I hope we have been able to provide you high quality care today.  You may receive a patient satisfaction survey over the next few weeks. We would appreciate your feedback and comments so that we may continue to improve ourselves and the health of our patients.

## 2023-10-02 NOTE — Progress Notes (Signed)
 Chief Complaint  Patient presents with   Seizures    Rm 8 alone Pt is well, reports she has had 3 absent seizures and sterring spells since last visit. Memory has worsen.  She also reports she has only had 3 migraines since starting Qulitpa       ASSESSMENT AND PLAN  Sharon Welch is a 53 y.o. 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  Recurrent confusion, staring spells, with falls; has improved on lamotrigine, differentiation diagnosis include complex partial seizure versus opiates overuse, medication side effect  MRI of the brain showed stable left frontal cavernous angioma  EEG was normal, 72 hours EEG normal  3 additional staring events over the past 6 months  Recommend initiating Keppra 500 mg twice daily Continue lamotrigine 150 mg twice daily Check lamotrigine level today  Previously on Depakote  Advised to call with any recurrent seizure activity   Chronic migraine headaches  Greatly improved on Qulipta  Continue Qulipta 60 mg nightly  Continue rizatriptan as needed for rescue per PCP    Follow-up in 4-6 months with Dr. Terrace Arabia for further recommendations or call earlier if needed     DIAGNOSTIC DATA (LABS, IMAGING, TESTING) - I reviewed patient records, labs, notes, testing and imaging myself where available. Personally reviewed MRI of the brain with and without contrast from 2012, left temporoparietal region chronic hemorrhage, likely related to chronic hemorrhage from cavernoma  MRI of the brain showed no significant abnormality  Laboratory evaluation in 12-28-23CBC showed normal hemoglobin of 13.7, mild elevation of WBC 11, normal BMP  72-hour EEG normal     MEDICAL HISTORY:  Update 10/02/2023 JM: Patient returns for follow-up visit.  She was started on Qulipta back in October for persistent headaches. Has only had 3-4 headaches since initiating, previously  experiencing 2-3/week. Using rizatriptan as needed with benefit.   She reports 3 staring episodes since prior visit with most recent one occurring last week.  She does admit to occasionally missing dosage of lamotrigine and has started using pillboxes to ensure compliance.  She may have missed a dosage of lamotrigine prior to episode but not completely sure, she does endorse increased generalized stress after her mother-in-law passed away in 06-20-2024 but is unable to say exactly when her seizures occurred except for the one last week.  She reports previously being on Keppra over 10 years ago which controlled her seizures well but self discontinued to reduce pill burden.  She does complain of some mild cognitive difficulties more so with short-term such as forgetting recent conversations.  Able to maintain ADLs and IADLs independently. MOCA 29/30     History provided for reference purposes only Update 03/19/2023 JM: Returns for follow-up visit unaccompanied (husband waiting in lobby).  Hospitalization in May after a fall down a flight of stairs secondary to seizure with left femoral neck fracture s/p total hip arthroplasty. Per note, no longer taking Depakote, only taking Lamictal although she was supposed to be taking both.  Continued on lamotrigine 100 mg twice daily, no further seizures during hospitalization. D/cd home after 4 day stay with [facility] therapies.   ED eval 6/23 for functional decline with vomiting and decreased activity. Sent home from recent admission on muscle relaxants, sedated some pain medications.  Concern for polypharmacy vs dehydration.  Provided IV fluids and Zofran.  Lab work showed electrolyte disturbance and mild dehydration.  Discussed nursing home placement but patient declined.  Reports 1 additional seizure 1 week ago, thankfully without injury.  Reports compliance on lamotrigine 100 mg twice daily, denies side effects. 72 hr EEG normal, no seizures or epileptiform discharges  and no events seen during this recording.  She no longer drives.    Has been having migraines more frequently over the past week, use of rizatriptan with benefit at times  UPDATE Oct 29 2022 Dr. Terrace Arabia: She is with her daughter at visit, her seizure-like spell overall has much improved, taking Depakote ER 500 mg 2 tablets every night, level was 39  She complains of bilateral hands tremor, intermittent dizziness, weakness, legs gave out under her  She still report seizure activity 2 to 3 times a month, blank stares, sometimes came out of it body shaking, confused for a while afterwards.  Personally reviewed MRI of the brain with without contrast August 02, 2022, chronic chemo product in the patent consistent with cerebral cavernous angioma in the posterior left subcortical frontal lobe, stable compared to previous MRI in November 2015.  Hospital admission from January 25 to 27, 2024, for confusion, slurred speech, dizziness, headaches, was diagnosed with idiopathic hypotension, history of peptic ulcer disease,  [place] presentation September 20, 2022, not feeling well, episode of feeling shaky, body heaviness, especially both legs   Laboratory evaluations in March 2024: Normal inactive magnesium, HIV, B12, CMP, showed low total protein and albumin, calcium was 7.6,  UDS was positive for opiates,    Consult visit 07/18/2022 Dr. Terrace Arabia: Sharon Welch, is a 53 year old female, accompanied by her daughter seen in request by her primary care from [facility] medical doctor Elfredia Nevins, for evaluation of seizure, initial evaluation was on July 18, 2022  I reviewed and summarized the referring note. PMHX Chronic migraine,  GERD, history of gastric ulcer. Chronic insomnia Chronic constipation Fibromyogia Chronic migraine Cervical Cancer, s/p hysterectomy Smoke   She was under the care of by Dr. Gerilyn Pilgrim, who has closed his practice, she was given the diagnosis of seizure, chronic migraine  headaches  Patient reported history of migraine since 53 years old, lateralized severe pounding headache with light noise sensitivity  Following her gastric ulcer procedure in 2010, she also developed seizure, sometimes has aura of strange smell, followed by loss of consciousness, but sometimes she went into generalized tonic-clonic seizure  Last generalized seizure without warning signs was in December 2023, at nighttime she was sitting watching TV, woke up had a significant left side retro-orbital area headache, more intense than her usual migraine headaches, also has brain fog, confusion, lasting for few hours  Yesterday July 17, 2022, she also had an episode of sudden onset vertigo, confusion, time laps,  Daughter also reported she has zoning out spells, couple times each months, occasionally with finger fidgety, blinking  Personally reviewed MRI of the brain in 2012, evidence of left parietotemporal region cavernoma, hemorrhage, no mass effect,  She has been treated with Topamax 100 mg twice a day, worried about her weight loss, continue to have migraine headache once or twice each week,  Has been on disability for many years due to history of scoliosis, Harrington rod placement, quit driving around 0981,       PHYSICAL EXAM:   Vitals:   10/02/23 1457  BP: 129/81  Pulse: 67  Weight: 104 lb (47.2 kg)  Height: 5' (1.524 m)    Body mass index is 20.31 kg/m.  PHYSICAL EXAMNIATION:  Gen: NAD, conversant, frail appearing older than stated age, very pleasant middle-age Caucasian female  Cardiovascular: Regular rate rhythm, no peripheral edema, warm, nontender. Eyes: Conjunctivae clear without exudates or hemorrhage Neck: Supple, no carotid bruits. Pulmonary: Clear to auscultation bilaterally   NEUROLOGICAL EXAM:  MENTAL STATUS: Speech/cognition:  Fragile, thin lady, awake, alert, oriented to history taking and casual conversation CRANIAL NERVES: CN II: Visual  fields are full to confrontation. Pupils are round equal and briskly reactive to light. CN III, IV, VI: extraocular movement are normal. No ptosis. CN V: Facial sensation is intact to light touch CN VII: Face is symmetric with normal eye closure  CN VIII: Hearing is normal to causal conversation. CN IX, X: Phonation is normal. CN XI: Head turning and shoulder shrug are intact  MOTOR: Full strength in all tested extremities  REFLEXES: Reflexes are 2 and symmetric at the biceps, triceps, knees, and ankles. Plantar responses are flexor.  SENSORY: Intact to light touch, pinprick and vibratory sensation are intact in fingers and toes.  COORDINATION: There is no trunk or limb dysmetria noted.  GAIT/STANCE: Able to get up from seated position arm crossed, cautious gait   REVIEW OF SYSTEMS:  Full 14 system review of systems performed and notable only for as above All other review of systems were negative.   ALLERGIES: Allergies  Allergen Reactions   Ultram [Tramadol] Other (See Comments)    Possible seizures   Aleve [Naproxen] Other (See Comments)    Ulcers   Blueberry [Vaccinium Angustifolium] Swelling   Coconut (Cocos Nucifera) Swelling   Depakote [Divalproex Sodium] Other (See Comments)    Insomnia . (Changed from divalproex to Lamictal due to side effects)   Nsaids Other (See Comments)    History of bleeding ulcers.   Pineapple Other (See Comments)    Migraine    HOME MEDICATIONS: Current Outpatient Medications  Medication Sig Dispense Refill   albuterol (VENTOLIN HFA) 108 (90 Base) MCG/ACT inhaler Inhale 1-2 puffs into the lungs every 4 (four) hours as needed for wheezing or shortness of breath.     ALPRAZolam (XANAX) 0.25 MG tablet Take 0.25 mg by mouth 4 (four) times daily as needed.     amitriptyline (ELAVIL) 25 MG tablet Take 50 mg by mouth at bedtime.     amoxicillin-clavulanate (AUGMENTIN) 875-125 MG tablet Take 1 tablet by mouth every 12 (twelve) hours. 10 tablet  0   aspirin EC 81 MG tablet Take 1 tablet (81 mg total) by mouth 2 (two) times daily. To prevent blood clots for 30 days after surgery. 60 tablet 0   Compression Bandages KIT 1 each by Does not apply route daily. 1 kit 0   dexlansoprazole (DEXILANT) 60 MG capsule Take 60 mg by mouth daily.     diltiazem (CARDIZEM CD) 180 MG 24 hr capsule TAKE ONE CAPSULE BY MOUTH EVERY DAY 30 capsule 5   folic acid (FOLVITE) 1 MG tablet Take 1 mg by mouth daily.     furosemide (LASIX) 20 MG tablet Take 1 tablet (20 mg total) by mouth daily. 90 tablet 3   HYDROcodone-acetaminophen (NORCO) 10-325 MG tablet Take 1 tablet by mouth in the morning, at noon, in the evening, and at bedtime.     levETIRAcetam (KEPPRA) 500 MG tablet Take 1 tablet (500 mg total) by mouth 2 (two) times daily. 60 tablet 5   linaclotide (LINZESS) 145 MCG CAPS capsule Take 145 mcg by mouth daily before breakfast.     meclizine (ANTIVERT) 25 MG tablet Take 1 tablet (25 mg total) by mouth 3 (three) times daily. 30 tablet 0  methocarbamol (ROBAXIN) 500 MG tablet Take 500 mg by mouth 3 (three) times daily.     Milnacipran (SAVELLA) 50 MG TABS tablet Take 50 mg by mouth in the morning, at noon, in the evening, and at bedtime.     promethazine (PHENERGAN) 25 MG tablet Take 25 mg by mouth every 6 (six) hours as needed for nausea or vomiting.     rizatriptan (MAXALT) 10 MG tablet Take 10 mg by mouth See admin instructions. 10 mg as needed for migraine, may repeat 10 mg in 2 hours if headache persists or recurs.     lamoTRIgine (LAMICTAL) 150 MG tablet Take 1 tablet (150 mg total) by mouth 2 (two) times daily. 60 tablet 11   QULIPTA 60 MG TABS Take 1 tablet (60 mg total) by mouth daily. 30 tablet 11   No current facility-administered medications for this visit.    PAST MEDICAL HISTORY: Past Medical History:  Diagnosis Date   Bruising, spontaneous 12/08/2013   Cancer (HCC)    cervical cancer - had hysterectomy   Chronic back pain    Chronic back  pain    DDD (degenerative disc disease), lumbar    DDD (degenerative disc disease), lumbar    Fibromyalgia    Headache(784.0)    migraines   Ovarian cyst    Partial tear subscapularis tendon 12/27/2011   Scoliosis    Seizures (HCC)    started 9/12-    PAST SURGICAL HISTORY: Past Surgical History:  Procedure Laterality Date   ABDOMINAL HYSTERECTOMY  2012   BACK SURGERY  1989   scoliosis throsic-rods   CESAREAN SECTION     CHOLECYSTECTOMY N/A 11/17/2012   Procedure: LAPAROSCOPIC CHOLECYSTECTOMY WITH INTRAOPERATIVE CHOLANGIOGRAM;  Surgeon: Axel Filler, MD;  Location: [facility] OR;  Service: General;  Laterality: N/A;   COLONOSCOPY WITH PROPOFOL N/A 04/21/2020   Procedure: COLONOSCOPY WITH PROPOFOL;  Surgeon: Jeani Hawking, MD;  Location: [facility];  Service: Endoscopy;  Laterality: N/A;   ENDOSCOPIC RETROGRADE CHOLANGIOPANCREATOGRAPHY (ERCP) WITH PROPOFOL N/A 06/11/2019   Procedure: ENDOSCOPIC RETROGRADE CHOLANGIOPANCREATOGRAPHY (ERCP) WITH PROPOFOL;  Surgeon: Jeani Hawking, MD;  Location: [facility];  Service: Endoscopy;  Laterality: N/A;   ERCP N/A 05/07/2013   Procedure: ENDOSCOPIC RETROGRADE CHOLANGIOPANCREATOGRAPHY (ERCP);  Surgeon: Theda Belfast, MD;  Location: [facility];  Service: Endoscopy;  Laterality: N/A;  start ercp first, if canulation fails switch to EUS    ESOPHAGOGASTRODUODENOSCOPY (EGD) WITH PROPOFOL N/A 05/15/2019   Procedure: ESOPHAGOGASTRODUODENOSCOPY (EGD) WITH PROPOFOL;  Surgeon: West Bali, MD;  Location: [facility];  Service: Endoscopy;  Laterality: N/A;   ESOPHAGOGASTRODUODENOSCOPY (EGD) WITH PROPOFOL N/A 03/03/2020   Procedure: ESOPHAGOGASTRODUODENOSCOPY (EGD) WITH PROPOFOL;  Surgeon: Jeani Hawking, MD;  Location: [facility];  Service: Endoscopy;  Laterality: N/A;   ESOPHAGOGASTRODUODENOSCOPY (EGD) WITH PROPOFOL N/A 04/21/2020   Procedure: ESOPHAGOGASTRODUODENOSCOPY (EGD) WITH PROPOFOL;  Surgeon: Jeani Hawking, MD;  Location: [facility];   Service: Endoscopy;  Laterality: N/A;   EUS N/A 11/03/2012   Procedure: UPPER ENDOSCOPIC ULTRASOUND (EUS) LINEAR;  Surgeon: Theda Belfast, MD;  Location: [facility];  Service: Endoscopy;  Laterality: N/A;   left arm     NECK SURGERY     POLYPECTOMY  04/21/2020   Procedure: POLYPECTOMY;  Surgeon: Jeani Hawking, MD;  Location: [facility];  Service: Endoscopy;;   REMOVAL OF STONES  06/11/2019   Procedure: REMOVAL OF STONES;  Surgeon: Jeani Hawking, MD;  Location: [facility];  Service: Endoscopy;;   TOOTH EXTRACTION N/A 05/24/2022   Procedure: DENTAL RESTORATION/EXTRACTIONS;  Surgeon: Ocie Doyne, DMD;  Location: [facility] OR;  Service: Oral Surgery;  Laterality: N/A;   TOTAL HIP ARTHROPLASTY Left 11/17/2022   Procedure: TOTAL HIP ARTHROPLASTY ANTERIOR APPROACH;  Surgeon: Sheral Apley, MD;  Location: [facility] OR;  Service: Orthopedics;  Laterality: Left;   TUBAL LIGATION     UPPER ESOPHAGEAL ENDOSCOPIC ULTRASOUND (EUS) N/A 06/11/2019   Procedure: UPPER ESOPHAGEAL ENDOSCOPIC ULTRASOUND (EUS);  Surgeon: Jeani Hawking, MD;  Location: [facility];  Service: Endoscopy;  Laterality: N/A;    FAMILY HISTORY: Family History  Problem Relation Age of Onset   Hypertension Mother    Hypertension Father    Cancer Father        prostate   Heart disease Father    Hypertension Brother    Hypertension Brother     SOCIAL HISTORY: Social History   Socioeconomic History   Marital status: Married    Spouse name: Not on file   Number of children: Not on file   Years of education: Not on file   Highest education level: Not on file  Occupational History   Not on file  Tobacco Use   Smoking status: Every Day    Current packs/day: 0.50    Average packs/day: 0.5 packs/day for 20.0 years (10.0 ttl pk-yrs)    Types: Cigarettes   Smokeless tobacco: Never  Vaping Use   Vaping status: Never Used  Substance and Sexual Activity   Alcohol use: Not Currently   Drug use: No   Sexual activity: Not on file   Other Topics Concern   Not on file  Social History Narrative   Live with husband 2 dogs and 4 cats   Right handed   Caffeine- 24oz-36oz daily   Social Drivers of Health   Financial Resource Strain: Not on file  Food Insecurity: No Food Insecurity (11/16/2022)   Hunger Vital Sign    Worried About Running Out of Food in the Last Year: Never true    Ran Out of Food in the Last Year: Never true  Transportation Needs: No Transportation Needs (11/16/2022)   PRAPARE - Administrator, Civil Service (Medical): No    Lack of Transportation (Non-Medical): No  Physical Activity: Not on file  Stress: Not on file  Social Connections: Unknown (03/28/2023)   Received from [facility]   Social Network    Social Network: Not on file  Intimate Partner Violence: Unknown (03/28/2023)   Received from [facility]   HITS    Physically Hurt: Not on file    Insult or Talk Down To: Not on file    Threaten Physical Harm: Not on file    Scream or Curse: Not on file      I spent 30 minutes of face-to-face and non-face-to-face time with patient.  This included previsit chart review including review of hospitalizations, lab review, study review, order entry, electronic health record documentation, patient education and discussion regarding above diagnoses and treatment plan and answered all other questions to patient's satisfaction  Ihor Austin, [facility]  [facility] [address] [place], [place] [address]  Phone [phone] Fax [phone] Note: This document was prepared with digital dictation and possible smart phrase technology. Any transcriptional errors that result from this process are unintentional.

## 2023-10-03 LAB — LAMOTRIGINE LEVEL: Lamotrigine Lvl: 6.8 ug/mL (ref 2.0–20.0)

## 2023-10-06 ENCOUNTER — Encounter: Payer: Self-pay | Admitting: Adult Health

## 2023-10-16 NOTE — Progress Notes (Signed)
 Surgery orders requested via Epic inbox.

## 2023-10-22 NOTE — Progress Notes (Addendum)
 COVID Vaccine Completed:  Date of COVID positive in last 90 days: no  PCP - Ulysess Gang, MD Cardiologist - Kelly Patient, MD LOV 05/28/23 Neurology- Johny Nap, NP  Chest x-ray - 08/02/23 Epic EKG - 06/02/23 Epic Stress Test - 12/16/22 Epic ECHO - 01/21/23 Epic Cardiac Cath - n/a Pacemaker/ICD device last checked: n/a Spinal Cord Stimulator:n/a  Bowel Prep - no  Sleep Study - n/a CPAP -   Fasting Blood Sugar - n/a Checks Blood Sugar _____ times a day  Last dose of GLP1 agonist-  N/A GLP1 instructions:  Hold 7 days before surgery    Last dose of SGLT-2 inhibitors-  N/A SGLT-2 instructions:  Hold 3 days before surgery    Blood Thinner Instructions:  Last dose:   Time: Aspirin  Instructions: ASA 81, hold 7 days Last Dose:  Activity level: Can go up a flight of stairs and perform activities of daily living without stopping and without symptoms of chest pain or shortness of breath.   Anesthesia review: syncope, seizures, palpitations, hypotension  Patient denies shortness of breath, fever, cough and chest pain at PAT appointment  Patient verbalized understanding of instructions that were given to them at the PAT appointment. Patient was also instructed that they will need to review over the PAT instructions again at home before surgery.

## 2023-10-23 ENCOUNTER — Encounter (HOSPITAL_COMMUNITY)
Admission: RE | Admit: 2023-10-23 | Discharge: 2023-10-23 | Disposition: A | Discharge status: Home / Self Care | Source: Ambulatory Visit | Attending: Orthopedic Surgery | Admitting: Orthopedic Surgery

## 2023-10-23 ENCOUNTER — Encounter (HOSPITAL_COMMUNITY): Payer: Self-pay

## 2023-10-23 ENCOUNTER — Telehealth: Payer: Self-pay | Admitting: Internal Medicine

## 2023-10-23 ENCOUNTER — Other Ambulatory Visit: Payer: Self-pay

## 2023-10-23 VITALS — BP 142/94 | HR 100 | Temp 98.4°F | Resp 18 | Ht 60.0 in | Wt 100.0 lb

## 2023-10-23 DIAGNOSIS — K920 Hematemesis: Secondary | ICD-10-CM

## 2023-10-23 DIAGNOSIS — Z01818 Encounter for other preprocedural examination: Secondary | ICD-10-CM

## 2023-10-23 DIAGNOSIS — Z01812 Encounter for preprocedural laboratory examination: Secondary | ICD-10-CM | POA: Insufficient documentation

## 2023-10-23 DIAGNOSIS — G40909 Epilepsy, unspecified, not intractable, without status epilepticus: Secondary | ICD-10-CM | POA: Insufficient documentation

## 2023-10-23 HISTORY — DX: Personal history of urinary calculi: Z87.442

## 2023-10-23 HISTORY — DX: Unspecified asthma, uncomplicated: J45.909

## 2023-10-23 HISTORY — DX: Cardiac arrhythmia, unspecified: I49.9

## 2023-10-23 LAB — CBC
HCT: 43.6 % (ref 36.0–46.0)
Hemoglobin: 14.3 g/dL (ref 12.0–15.0)
MCH: 31.5 pg (ref 26.0–34.0)
MCHC: 32.8 g/dL (ref 30.0–36.0)
MCV: 96 fL (ref 80.0–100.0)
Platelets: 334 10*3/uL (ref 150–400)
RBC: 4.54 MIL/uL (ref 3.87–5.11)
RDW: 12.4 % (ref 11.5–15.5)
WBC: 7.3 10*3/uL (ref 4.0–10.5)
nRBC: 0 % (ref 0.0–0.2)

## 2023-10-23 LAB — BASIC METABOLIC PANEL WITH GFR
Anion gap: 8 (ref 5–15)
BUN: 14 mg/dL (ref 6–20)
CO2: 27 mmol/L (ref 22–32)
Calcium: 9.5 mg/dL (ref 8.9–10.3)
Chloride: 103 mmol/L (ref 98–111)
Creatinine, Ser: 0.73 mg/dL (ref 0.44–1.00)
GFR, Estimated: >60 mL/min (ref >60)
Glucose, Bld: 97 mg/dL (ref 70–99)
Potassium: 4.5 mmol/L (ref 3.5–5.1)
Sodium: 138 mmol/L (ref 135–145)

## 2023-10-23 LAB — SURGICAL PCR SCREEN
MRSA, PCR: NEGATIVE
Staphylococcus aureus: NEGATIVE

## 2023-10-23 NOTE — Telephone Encounter (Signed)
   Pre-operative Risk Assessment    [name]  DOB: 12-12-1970 MRN: [id number]      Request for Surgical Clearance    Procedure:   Left Reverse, Total Shoulder Arthroplasty   Date of Surgery:  Clearance TBD                                 Surgeon:  Osa Blase, MD Surgeon's Group or Practice Name:  [facility]  Phone number:  [phone] Fax number:  [phone]   Type of Clearance Requested:   - Medical    Type of Anesthesia:  General  interscalene Block   Additional requests/questions:  Please fax a copy of clearance to the surgeon's office.  Signed, Georgia Kipper   10/23/2023, 1:41 PM

## 2023-10-23 NOTE — Patient Instructions (Addendum)
 SURGICAL WAITING ROOM VISITATION  Patients having surgery or a procedure may have no more than 2 support people in the waiting area - these visitors may rotate.    Children under the age of 44 must have an adult with them who is not the patient.  Due to an increase in RSV and influenza rates and associated hospitalizations, children ages 44 and under may not visit patients in [facility].  Visitors with respiratory illnesses are discouraged from visiting and should remain at home.  If the patient needs to stay at the [facility] during part of their recovery, the visitor guidelines for inpatient rooms apply. Pre-op nurse will coordinate an appropriate time for 1 support person to accompany patient in pre-op.  This support person may not rotate.    Please refer to the [facility] website for the visitor guidelines for Inpatients (after your surgery is over and you are in a regular room).    Your procedure is scheduled on: 11/04/23   Report to [facility]    Report to admitting at 11:30 AM   Call this number if you have problems the morning of surgery [phone]   Do not eat food :After Midnight.   After Midnight you may have the following liquids until 11:00 AM DAY OF SURGERY  Water  Non-Citrus Juices (without pulp, NO RED-Apple, White grape, White cranberry) Black Coffee (NO MILK/CREAM OR CREAMERS, sugar ok)  Clear Tea (NO MILK/CREAM OR CREAMERS, sugar ok) regular and decaf                             Plain Jell-O (NO RED)                                           Fruit ices (not with fruit pulp, NO RED)                                     Popsicles (NO RED)                                                               Sports drinks like Gatorade (NO RED)                 The day of surgery:  Drink ONE (1) Pre-Surgery Clear Ensure at 11:15 AM the morning of surgery. Drink in one sitting. Do not sip.  This drink was given to you during your [facility]   pre-op appointment visit. Nothing else to drink after completing the  Pre-Surgery Clear Ensure          If you have questions, please contact your surgeon's office.   FOLLOW BOWEL PREP AND ANY ADDITIONAL PRE OP INSTRUCTIONS YOU RECEIVED FROM YOUR SURGEON'S OFFICE!!!     Oral Hygiene is also important to reduce your risk of infection.                                    Remember -  BRUSH YOUR TEETH THE MORNING OF SURGERY WITH YOUR REGULAR TOOTHPASTE  DENTURES WILL BE REMOVED PRIOR TO SURGERY PLEASE DO NOT APPLY "Poly grip" OR ADHESIVES!!!   Do NOT smoke after Midnight   Stop all vitamins and herbal supplements 7 days before surgery.   Take these medicines the morning of surgery with A SIP OF WATER : Inhalers, Alprazolam , Dexilant, Diltiazem , Norco, Keppra , Lamictal , Meclizine                                You may not have any metal on your body including hair pins, jewelry, and body piercing             Do not wear make-up, lotions, powders, perfumes, or deodorant  Do not wear nail polish including gel and S&S, artificial/acrylic nails, or any other type of covering on natural nails including finger and toenails. If you have artificial nails, gel coating, etc. that needs to be removed by a nail salon please have this removed prior to surgery or surgery may need to be canceled/ delayed if the surgeon/ anesthesia feels like they are unable to be safely monitored.   Do not shave  48 hours prior to surgery.    Do not bring valuables to the [facility]. [facility] IS NOT             RESPONSIBLE   FOR VALUABLES.   Contacts, glasses, dentures or bridgework may not be worn into surgery.  DO NOT BRING YOUR HOME MEDICATIONS TO THE [facility]. PHARMACY WILL DISPENSE MEDICATIONS LISTED ON YOUR MEDICATION LIST TO YOU DURING YOUR ADMISSION IN THE [facility]!    Patients discharged on the day of surgery will not be allowed to drive home.  Someone NEEDS to stay with you for the first 24 hours after  anesthesia.              Please read over the following fact sheets you were given: IF YOU HAVE QUESTIONS ABOUT YOUR PRE-OP INSTRUCTIONS PLEASE CALL [name]    If you received a COVID test during your pre-op visit  it is requested that you wear a mask when out in public, stay away from anyone that may not be feeling well and notify your surgeon if you develop symptoms. If you test positive for Covid or have been in contact with anyone that has tested positive in the last 10 days please notify you surgeon.      Pre-operative 5 CHG Bath Instructions   You can play a key role in reducing the risk of infection after surgery. Your skin needs to be as free of germs as possible. You can reduce the number of germs on your skin by washing with CHG (chlorhexidine  gluconate) soap before surgery. CHG is an antiseptic soap that kills germs and continues to kill germs even after washing.   DO NOT use if you have an allergy to chlorhexidine /CHG or antibacterial soaps. If your skin becomes reddened or irritated, stop using the CHG and notify one of our RNs at [phone].   Please shower with the CHG soap starting 4 days before surgery using the following schedule:     Please keep in mind the following:  DO NOT shave, including legs and underarms, starting the day of your first shower.   You may shave your face at any point before/day of surgery.  Place clean sheets on your bed the day you start using CHG soap. Use  a clean washcloth (not used since being washed) for each shower. DO NOT sleep with pets once you start using the CHG.   CHG Shower Instructions:  If you choose to wash your hair and private area, wash first with your normal shampoo/soap.  After you use shampoo/soap, rinse your hair and body thoroughly to remove shampoo/soap residue.  Turn the water  OFF and apply about 3 tablespoons (45 ml) of CHG soap to a CLEAN washcloth.  Apply CHG soap ONLY FROM YOUR NECK DOWN TO YOUR TOES  (washing for 3-5 minutes)  DO NOT use CHG soap on face, private areas, open wounds, or sores.  Pay special attention to the area where your surgery is being performed.  If you are having back surgery, having someone wash your back for you may be helpful. Wait 2 minutes after CHG soap is applied, then you may rinse off the CHG soap.  Pat dry with a clean towel  Put on clean clothes/pajamas   If you choose to wear lotion, please use ONLY the CHG-compatible lotions on the back of this paper.     Additional instructions for the day of surgery: DO NOT APPLY any lotions, deodorants, cologne, or perfumes.   Put on clean/comfortable clothes.  Brush your teeth.  Ask your nurse before applying any prescription medications to the skin.      CHG Compatible Lotions   Aveeno Moisturizing lotion  Cetaphil Moisturizing Cream  Cetaphil Moisturizing Lotion  Clairol Herbal Essence Moisturizing Lotion, Dry Skin  Clairol Herbal Essence Moisturizing Lotion, Extra Dry Skin  Clairol Herbal Essence Moisturizing Lotion, Normal Skin  Curel Age Defying Therapeutic Moisturizing Lotion with Alpha Hydroxy  Curel Extreme Care Body Lotion  Curel Soothing Hands Moisturizing Hand Lotion  Curel Therapeutic Moisturizing Cream, Fragrance-Free  Curel Therapeutic Moisturizing Lotion, Fragrance-Free  Curel Therapeutic Moisturizing Lotion, Original Formula  Eucerin Daily Replenishing Lotion  Eucerin Dry Skin Therapy Plus Alpha Hydroxy Crme  Eucerin Dry Skin Therapy Plus Alpha Hydroxy Lotion  Eucerin Original Crme  Eucerin Original Lotion  Eucerin Plus Crme Eucerin Plus Lotion  Eucerin TriLipid Replenishing Lotion  Keri Anti-Bacterial Hand Lotion  Keri Deep Conditioning Original Lotion Dry Skin Formula Softly Scented  Keri Deep Conditioning Original Lotion, Fragrance Free Sensitive Skin Formula  Keri Lotion Fast Absorbing Fragrance Free Sensitive Skin Formula  Keri Lotion Fast Absorbing Softly Scented Dry Skin  Formula  Keri Original Lotion  Keri Skin Renewal Lotion Keri Silky Smooth Lotion  Keri Silky Smooth Sensitive Skin Lotion  Nivea Body Creamy Conditioning Oil  Nivea Body Extra Enriched Lotion  Nivea Body Original Lotion  Nivea Body Sheer Moisturizing Lotion Nivea Crme  Nivea Skin Firming Lotion  NutraDerm 30 Skin Lotion  NutraDerm Skin Lotion  NutraDerm Therapeutic Skin Cream  NutraDerm Therapeutic Skin Lotion  ProShield Protective Hand Cream  Provon moisturizing lotion View Pre-Surgery Education Videos:  [email or web address]    [facility]- Preparing for Total Shoulder Arthroplasty    Before surgery, you can play an important role. Because skin is not sterile, your skin needs to be as free of germs as possible. You can reduce the number of germs on your skin by using the following products. Benzoyl Peroxide Gel Reduces the number of germs present on the skin Applied twice a day to shoulder area starting two days before surgery    ==================================================================  Please follow these instructions carefully:  BENZOYL PEROXIDE 5% GEL  Please do not use if you have an allergy to benzoyl peroxide.   If your  skin becomes reddened/irritated stop using the benzoyl peroxide.  Starting two days before surgery, apply as follows: Apply benzoyl peroxide in the morning and at night. Apply after taking a shower. If you are not taking a shower clean entire shoulder front, back, and side along with the armpit with a clean wet washcloth.  Place a quarter-sized dollop on your shoulder and rub in thoroughly, making sure to cover the front, back, and side of your shoulder, along with the armpit.   2 days before ____ AM   ____ PM              1 day before ____ AM   ____ PM                         Do this twice a day for two days.  (Last application is the night before surgery, AFTER using the CHG soap as  described below).  Do NOT apply benzoyl peroxide gel on the day of surgery.

## 2023-10-24 ENCOUNTER — Telehealth: Payer: Self-pay

## 2023-10-24 NOTE — Telephone Encounter (Signed)
 Pt scheduled for Tele Preop appt 10/31/23. Med Rec and consent done.     Patient Consent for Virtual Visit        [facility] has provided verbal consent on 10/24/2023 for a virtual visit (video or telephone).   CONSENT FOR VIRTUAL VISIT FOR:  [facility]  By participating in this virtual visit I agree to the following:  I hereby voluntarily request, consent and authorize [facility] and its employed or contracted physicians, physician assistants, nurse practitioners or other licensed health care professionals (the Practitioner), to provide me with telemedicine health care services (the "Services") as deemed necessary by the treating Practitioner. I acknowledge and consent to receive the Services by the Practitioner via telemedicine. I understand that the telemedicine visit will involve communicating with the Practitioner through live audiovisual communication technology and the disclosure of certain medical information by electronic transmission. I acknowledge that I have been given the opportunity to request an in-person assessment or other available alternative prior to the telemedicine visit and am voluntarily participating in the telemedicine visit.  I understand that I have the right to withhold or withdraw my consent to the use of telemedicine in the course of my care at any time, without affecting my right to future care or treatment, and that the Practitioner or I may terminate the telemedicine visit at any time. I understand that I have the right to inspect all information obtained and/or recorded in the course of the telemedicine visit and may receive copies of available information for a reasonable fee.  I understand that some of the potential risks of receiving the Services via telemedicine include:  Delay or interruption in medical evaluation due to technological equipment failure or disruption; Information transmitted may not be sufficient (e.g. poor resolution of  images) to allow for appropriate medical decision making by the Practitioner; and/or  In rare instances, security protocols could fail, causing a breach of personal health information.  Furthermore, I acknowledge that it is my responsibility to provide information about my medical history, conditions and care that is complete and accurate to the best of my ability. I acknowledge that Practitioner's advice, recommendations, and/or decision may be based on factors not within their control, such as incomplete or inaccurate data provided by me or distortions of diagnostic images or specimens that may result from electronic transmissions. I understand that the practice of medicine is not an exact science and that Practitioner makes no warranties or guarantees regarding treatment outcomes. I acknowledge that a copy of this consent can be made available to me via my patient portal [facility] MyChart), or I can request a printed copy by calling the office of [facility].    I understand that my insurance will be billed for this visit.   I have read or had this consent read to me. I understand the contents of this consent, which adequately explains the benefits and risks of the Services being provided via telemedicine.  I have been provided ample opportunity to ask questions regarding this consent and the Services and have had my questions answered to my satisfaction. I give my informed consent for the services to be provided through the use of telemedicine in my medical care

## 2023-10-24 NOTE — Telephone Encounter (Signed)
   Name: [facility]  DOB: Sep 13, 1970  MRN: [id number]  Primary Cardiologist: Lasalle Pointer, MD   Preoperative team, please contact this patient and set up a phone call appointment for further preoperative risk assessment. Please obtain consent and complete medication review. Thank you for your help.  I confirm that guidance regarding antiplatelet and oral anticoagulation therapy has been completed and, if necessary, noted below.  No medications to be held   I also confirmed the patient resides in the state of [place] . As per [facility] Medical Board telemedicine laws, the patient must reside in the state in which the provider is licensed.   Ava Boatman, NP 10/24/2023, 12:10 PM [facility]

## 2023-10-24 NOTE — Telephone Encounter (Signed)
 Pt scheduled for Tele Preop appt 10/31/23. Med Rec and consent done.

## 2023-10-27 NOTE — H&P (Cosign Needed Addendum)
 SHOULDER ARTHROPLASTY ADMISSION H&P  Patient ID: Sharon Welch MRN: [id number] DOB/AGE: Apr 09, 1971 53 y.o.  Chief Complaint: left shoulder pain.  Planned Procedure Date: 11/04/23 Medical Clearance by Dr. Nickey Barn   Cardiac Clearance by Dr. Mallipeddi Additional clearance by Dr. Niels Barry (migraines/seizures), Dr. Gracie Lav (neurology), Dr. Lewayne Records (chronic pain)   HPI: Sharon Welch is a 53 y.o. female who presents for evaluation of LEFT SHOULDER OA. The patient has a history of pain and functional disability in the left shoulder due to trauma and has failed non-surgical conservative treatments for greater than 12 weeks to include NSAID's and/or analgesics and activity modification.  Onset of symptoms was abrupt, starting after a fall in october with rapidlly worsening course since that time. The patient noted history of left shoulder subscapularis repair.  Patient currently rates pain at 10 out of 10 with activity. Patient has worsening of pain with activity and weight bearing and pain that interferes with activities of daily living.  Patient has evidence of full thickness cuff tear by imaging studies.  There is no active infection.  Past Medical History:  Diagnosis Date   Asthma    seasonal   Bruising, spontaneous 12/08/2013   Cancer (HCC)    cervical cancer - had hysterectomy   Chronic back pain    Chronic back pain    DDD (degenerative disc disease), lumbar    DDD (degenerative disc disease), lumbar    Dysrhythmia    tachycardia   Fibromyalgia    Headache(784.0)    migraines   History of kidney stones    Ovarian cyst    Partial tear subscapularis tendon 12/27/2011   Scoliosis    Seizures (HCC)    started 9/12-   Past Surgical History:  Procedure Laterality Date   ABDOMINAL HYSTERECTOMY  2012   BACK SURGERY  1989   scoliosis throsic-rods   CESAREAN SECTION     CHOLECYSTECTOMY N/A 11/17/2012   Procedure: LAPAROSCOPIC CHOLECYSTECTOMY WITH INTRAOPERATIVE CHOLANGIOGRAM;  Surgeon:  Shela Derby, MD;  [facility];  Service: General;  Laterality: N/A;   COLONOSCOPY WITH PROPOFOL  N/A 04/21/2020   Procedure: COLONOSCOPY WITH PROPOFOL ;  Surgeon: Alvis Jourdain, MD;  [facility]: [facility];  Service: Endoscopy;  Laterality: N/A;   ENDOSCOPIC RETROGRADE CHOLANGIOPANCREATOGRAPHY (ERCP) WITH PROPOFOL  N/A 06/11/2019   Procedure: ENDOSCOPIC RETROGRADE CHOLANGIOPANCREATOGRAPHY (ERCP) WITH PROPOFOL ;  Surgeon: Alvis Jourdain, MD;  [facility];  Service: Endoscopy;  Laterality: N/A;   ERCP N/A 05/07/2013   Procedure: ENDOSCOPIC RETROGRADE CHOLANGIOPANCREATOGRAPHY (ERCP);  Surgeon: Almeda Aris, MD;  Location: [facility];  Service: Endoscopy;  Laterality: N/A;  start ercp first, if canulation fails switch to EUS    ESOPHAGOGASTRODUODENOSCOPY (EGD) WITH PROPOFOL  N/A 05/15/2019   Procedure: ESOPHAGOGASTRODUODENOSCOPY (EGD) WITH PROPOFOL ;  Surgeon: Alyce Jubilee, MD;  [facility];  Service: Endoscopy;  Laterality: N/A;   ESOPHAGOGASTRODUODENOSCOPY (EGD) WITH PROPOFOL  N/A 03/03/2020   Procedure: ESOPHAGOGASTRODUODENOSCOPY (EGD) WITH PROPOFOL ;  Surgeon: Alvis Jourdain, MD;  [facility];  Service: Endoscopy;  Laterality: N/A;   ESOPHAGOGASTRODUODENOSCOPY (EGD) WITH PROPOFOL  N/A 04/21/2020   Procedure: ESOPHAGOGASTRODUODENOSCOPY (EGD) WITH PROPOFOL ;  Surgeon: Alvis Jourdain, MD;  [facility];  Service: Endoscopy;  Laterality: N/A;   EUS N/A 11/03/2012   Procedure: UPPER ENDOSCOPIC ULTRASOUND (EUS) LINEAR;  Surgeon: Almeda Aris, MD;  [facility];  Service: Endoscopy;  Laterality: N/A;   left arm     NECK SURGERY     POLYPECTOMY  04/21/2020   Procedure: POLYPECTOMY;  Surgeon: Alvis Jourdain,  MD;  [facility];  Service: Endoscopy;;   REMOVAL OF STONES  06/11/2019   Procedure: REMOVAL OF STONES;  Surgeon: Alvis Jourdain, MD;  [facility];  Service: Endoscopy;;   TOOTH EXTRACTION N/A 05/24/2022   Procedure: DENTAL  RESTORATION/EXTRACTIONS;  Surgeon: Ascencion Lava, DMD;  [facility];  Service: Oral Surgery;  Laterality: N/A;   TOTAL HIP ARTHROPLASTY Left 11/17/2022   Procedure: TOTAL HIP ARTHROPLASTY ANTERIOR APPROACH;  Surgeon: Saundra Curl, MD;  [facility];  Service: Orthopedics;  Laterality: Left;   TUBAL LIGATION     UPPER ESOPHAGEAL ENDOSCOPIC ULTRASOUND (EUS) N/A 06/11/2019   Procedure: UPPER ESOPHAGEAL ENDOSCOPIC ULTRASOUND (EUS);  Surgeon: Alvis Jourdain, MD;  Location: [facility];  Service: Endoscopy;  Laterality: N/A;   Allergies  Allergen Reactions   Ultram [Tramadol] Other (See Comments)    Possible seizures   Aleve [Naproxen] Other (See Comments)    Ulcers   Blueberry [Vaccinium Angustifolium] Swelling   Coconut (Cocos Nucifera) Swelling   Depakote  [Divalproex  Sodium] Other (See Comments)    Insomnia . (Changed from divalproex  to Lamictal  due to side effects)   Nsaids Other (See Comments)    History of bleeding ulcers.   Pineapple Other (See Comments)    Migraine   Prior to Admission medications   Medication Sig Start Date End Date Taking? Authorizing Provider  albuterol  (VENTOLIN  HFA) 108 (90 Base) MCG/ACT inhaler Inhale 1-2 puffs into the lungs every 4 (four) hours as needed for wheezing or shortness of breath.   Yes [provider]  ALPRAZolam  (XANAX ) 0.25 MG tablet Take 0.25-0.5 mg by mouth 4 (four) times daily as needed for anxiety or sleep. 01/23/23  Yes [provider]  amitriptyline  (ELAVIL ) 25 MG tablet Take 50 mg by mouth at bedtime.   Yes [provider]  aspirin  EC 81 MG tablet Take 1 tablet (81 mg total) by mouth 2 (two) times daily. To prevent blood clots for 30 days after surgery. Patient taking differently: Take 81 mg by mouth daily. To prevent blood clots for 30 days after surgery. 11/19/22  Yes Gawne, Meghan M, PA-C  dexlansoprazole (DEXILANT) 60 MG capsule Take 60 mg by mouth daily.   Yes [provider]  diltiazem   (CARDIZEM  CD) 180 MG 24 hr capsule TAKE ONE CAPSULE BY MOUTH EVERY DAY 09/15/23  Yes Mallipeddi, Vishnu P, MD  folic acid  (FOLVITE ) 1 MG tablet Take 1 mg by mouth daily. 04/24/23  Yes [provider]  furosemide  (LASIX ) 20 MG tablet Take 1 tablet (20 mg total) by mouth daily. 01/09/23 05/27/97 Yes Mallipeddi, Vishnu P, MD  HYDROcodone -acetaminophen  (NORCO) 10-325 MG tablet Take 1 tablet by mouth 4 (four) times daily as needed for moderate pain (pain score 4-6) or severe pain (pain score 7-10). Can take to five times a day 03/10/23  Yes [provider]  lamoTRIgine  (LAMICTAL ) 150 MG tablet Take 1 tablet (150 mg total) by mouth 2 (two) times daily. 10/02/23  Yes McCue, Camilo Cella, NP  levETIRAcetam  (KEPPRA ) 500 MG tablet Take 1 tablet (500 mg total) by mouth 2 (two) times daily. 10/02/23  Yes McCue, Camilo Cella, NP  linaclotide  (LINZESS ) 145 MCG CAPS capsule Take 145 mcg by mouth daily before breakfast.   Yes [provider]  meclizine  (ANTIVERT ) 25 MG tablet Take 1 tablet (25 mg total) by mouth 3 (three) times daily. 05/28/23  Yes Mallipeddi, Vishnu P, MD  methocarbamol  (ROBAXIN ) 500 MG tablet Take 500 mg by mouth 3 (three) times daily. 03/28/23  Yes [provider]  Milnacipran  (SAVELLA ) 50 MG TABS tablet Take 50 mg by mouth 2 (two) times daily.   Yes [provider]  promethazine  (PHENERGAN ) 25 MG tablet Take 25 mg by mouth every 6 (six) hours as needed for nausea or vomiting.   Yes [provider]  QULIPTA  60 MG TABS Take 1 tablet (60 mg total) by mouth daily. 10/02/23  Yes McCue, Camilo Cella, NP  rizatriptan (MAXALT) 10 MG tablet Take 10 mg by mouth See admin instructions. 10 mg as needed for migraine, may repeat 10 mg in 2 hours if headache persists or recurs.   Yes [provider]  Compression Bandages KIT 1 each by Does not apply route daily. 04/14/23   Lasalle Pointer, NP   Social History   Socioeconomic History   Marital status: Married    Spouse  name: Not on file   Number of children: Not on file   Years of education: Not on file   Highest education level: Not on file  Occupational History   Not on file  Tobacco Use   Smoking status: Every Day    Current packs/day: 0.50    Average packs/day: 0.5 packs/day for 20.0 years (10.0 ttl pk-yrs)    Types: Cigarettes   Smokeless tobacco: Never  Vaping Use   Vaping status: Never Used  Substance and Sexual Activity   Alcohol use: Not Currently   Drug use: No   Sexual activity: Not on file  Other Topics Concern   Not on file  Social History Narrative   Live with husband 2 dogs and 4 cats   Right handed   Caffeine- 24oz-36oz daily   Social Drivers of Health   Financial Resource Strain: Not on file  Food Insecurity: No Food Insecurity (11/16/2022)   Hunger Vital Sign    Worried About Running Out of Food in the Last Year: Never true    Ran Out of Food in the Last Year: Never true  Transportation Needs: No Transportation Needs (11/16/2022)   PRAPARE - Administrator, Civil Service (Medical): No    Lack of Transportation (Non-Medical): No  Physical Activity: Not on file  Stress: Not on file  Social Connections: Unknown (03/28/2023)   Received [facility]   Social Network    Social Network: Not on file   Family History  Problem Relation Age of Onset   Hypertension Mother    Hypertension Father    Cancer Father        prostate   Heart disease Father    Hypertension Brother    Hypertension Brother     ROS: Currently denies lightheadedness, dizziness, Fever, chills, CP. No personal history of DVT, PE, MI, or CVA. No loose teeth + dentures All other systems have been reviewed and were otherwise currently negative with the exception of those mentioned in the HPI and as above.  BMI: There is no height or weight on file to calculate BMI.  Lab Results  Component Value Date   ALBUMIN 4.6 08/02/2023   Diabetes: Patient does not have a diagnosis of  diabetes.     Smoking Status: Social History   Tobacco Use  Smoking Status Every Day   Current packs/day: 0.50   Average packs/day: 0.5 packs/day for 20.0 years (10.0 ttl pk-yrs)   Types: Cigarettes  Smokeless Tobacco Never   Ready to quit: Not Answered Counseling given: Not Answered  Counseled to cut back or quit  Objective: Vitals: Ht: 5'  Wt: 105.1 lbs Temp: 98.5 BP: 116/81 Pulse: 102 O2 97% on room air.   Physical Exam: General: Alert, NAD.   HEENT: EOMI, Good Neck Extension  Pulm: No increased work of breathing.  Clear B/L A/P w/o crackle or wheeze.  CV: RRR, No m/g/r appreciated  GI: soft, NT, ND Neuro: Neuro without gross focal deficit.  Sensation intact distally Skin: No lesions in the area of chief complaint MSK/Surgical Site: left shoulder pain with range of motion.  Forward flexion/abduction approximately 80.  Internal rotation to lateral thigh.  External rotation to 40.  No AC pain.  No Biceps pain.  Diminished Rotator cuff strength.  NVI distally.  Imaging Review MRI demonstrates evidence for massive subscapularis tear with retraction and atrophy.   Assessment: Left shoulder recurrent massive subscapularis tear.    Plan: Plan for Procedure(s): ARTHROPLASTY, SHOULDER, TOTAL  The patient history, physical exam, clinical judgement of the provider and imaging are consistent with end stage degenerative joint disease and reverse total joint arthroplasty is deemed medically necessary. The treatment options including medical management, injection therapy, and arthroplasty were discussed at length. The risks and benefits of Procedure(s): ARTHROPLASTY, SHOULDER, TOTAL were presented and reviewed.  The risks of nonoperative treatment, versus surgical intervention including but not limited to continued pain, aseptic loosening, stiffness, dislocation/subluxation, infection, bleeding, nerve injury, blood clots, cardiopulmonary complications, morbidity,  mortality, among others were discussed. The patient verbalizes understanding and wishes to proceed with the plan.  Patient is being admitted for surgery, OT, pain control, prophylactic antibiotics, VTE prophylaxis, progressive ambulation, ADL's and discharge planning.   The patient does meet the criteria for TXA which will be used perioperatively.   The patient is planning to be discharged home care of husband.   Zayden Maffei K Xitlaly Ault, PA-C 10/27/2023 4:10 PM

## 2023-10-31 ENCOUNTER — Ambulatory Visit: Attending: Cardiology | Admitting: Student

## 2023-10-31 DIAGNOSIS — Z01810 Encounter for preprocedural cardiovascular examination: Secondary | ICD-10-CM

## 2023-10-31 NOTE — Progress Notes (Signed)
 Virtual Visit via Telephone Note   Because of Sharon Welch's co-morbid illnesses, she is at least at moderate risk for complications without adequate follow up.  This format is felt to be most appropriate for this patient at this time.  The patient did not have access to video technology/had technical difficulties with video requiring transitioning to audio format only (telephone).  All issues noted in this document were discussed and addressed.  No physical exam could be performed with this format.  Please refer to the patient's chart for her consent to telehealth for [facility].  Evaluation Performed:  Preoperative cardiovascular risk assessment _____________   Date:  10/31/2023   Patient ID:  Sharon Welch, DOB 01-20-1971, MRN [id number] Patient Location:  Home Provider location:   Office  Primary Care Provider:  Kathyleen Parkins, MD Primary Cardiologist:  Lasalle Pointer, MD  Chief Complaint / Patient Profile   53 y.o. y/o female with a h/o chest pain with normal low risk stress test June 2024, palpitations/PSVT, lower extremity edema, migraines, anxiety, tobacco abuse who is pending left reverse total shoulder arthroplasty by Dr. Agatha Horsfall on 11/04/2023 and presents today for telephonic preoperative cardiovascular risk assessment.  History of Present Illness    Sharon Welch is a 53 y.o. female who presents via audio/video conferencing for a telehealth visit today.  Pt was last seen in cardiology clinic on 05/28/2023 by Dr. Mallipeddi.  At that time Sharon Welch complained of chronic dizziness associated with tinnitus and was started on meclizine  and instructed to follow-up with PCP.  She was otherwise stable from cardiac standpoint.  The patient is now pending procedure as outlined above. Since her last visit, she is doing well. Patient denies shortness of breath, dyspnea on exertion, lower extremity edema, orthopnea or PND. No chest pain, pressure, or  tightness. No palpitations.  She does not follow a regular exercise routine. She is active around her home performing light to moderate household chores.   Past Medical History    Past Medical History:  Diagnosis Date   Asthma    seasonal   Bruising, spontaneous 12/08/2013   Cancer (HCC)    cervical cancer - had hysterectomy   Chronic back pain    Chronic back pain    DDD (degenerative disc disease), lumbar    DDD (degenerative disc disease), lumbar    Dysrhythmia    tachycardia   Fibromyalgia    Headache(784.0)    migraines   History of kidney stones    Ovarian cyst    Partial tear subscapularis tendon 12/27/2011   Scoliosis    Seizures (HCC)    started 9/12-   Past Surgical History:  Procedure Laterality Date   ABDOMINAL HYSTERECTOMY  2012   BACK SURGERY  1989   scoliosis throsic-rods   CESAREAN SECTION     CHOLECYSTECTOMY N/A 11/17/2012   Procedure: LAPAROSCOPIC CHOLECYSTECTOMY WITH INTRAOPERATIVE CHOLANGIOGRAM;  Surgeon: Shela Derby, MD;  Location: [facility];  Service: General;  Laterality: N/A;   COLONOSCOPY WITH PROPOFOL  N/A 04/21/2020   Procedure: COLONOSCOPY WITH PROPOFOL ;  Surgeon: Alvis Jourdain, MD;  [facility]: [facility];  Service: Endoscopy;  Laterality: N/A;   ENDOSCOPIC RETROGRADE CHOLANGIOPANCREATOGRAPHY (ERCP) WITH PROPOFOL  N/A 06/11/2019   Procedure: ENDOSCOPIC RETROGRADE CHOLANGIOPANCREATOGRAPHY (ERCP) WITH PROPOFOL ;  Surgeon: Alvis Jourdain, MD;  [facility]: [facility];  Service: Endoscopy;  Laterality: N/A;   ERCP N/A 05/07/2013   Procedure: ENDOSCOPIC RETROGRADE CHOLANGIOPANCREATOGRAPHY (ERCP);  Surgeon: Almeda Aris, MD;  [facility]: [facility];  Service: Endoscopy;  Laterality: N/A;  start ercp first, if canulation fails switch to EUS    ESOPHAGOGASTRODUODENOSCOPY (EGD) WITH PROPOFOL  N/A 05/15/2019   Procedure: ESOPHAGOGASTRODUODENOSCOPY (EGD) WITH PROPOFOL ;  Surgeon: Alyce Jubilee, MD;  [facility];  Service: Endoscopy;  Laterality:  N/A;   ESOPHAGOGASTRODUODENOSCOPY (EGD) WITH PROPOFOL  N/A 03/03/2020   Procedure: ESOPHAGOGASTRODUODENOSCOPY (EGD) WITH PROPOFOL ;  Surgeon: Alvis Jourdain, MD;  [facility];  Service: Endoscopy;  Laterality: N/A;   ESOPHAGOGASTRODUODENOSCOPY (EGD) WITH PROPOFOL  N/A 04/21/2020   Procedure: ESOPHAGOGASTRODUODENOSCOPY (EGD) WITH PROPOFOL ;  Surgeon: Alvis Jourdain, MD;  [facility]: [facility];  Service: Endoscopy;  Laterality: N/A;   EUS N/A 11/03/2012   Procedure: UPPER ENDOSCOPIC ULTRASOUND (EUS) LINEAR;  Surgeon: Almeda Aris, MD;  [facility]: [facility];  Service: Endoscopy;  Laterality: N/A;   left arm     NECK SURGERY     POLYPECTOMY  04/21/2020   Procedure: POLYPECTOMY;  Surgeon: Alvis Jourdain, MD;  [facility]: [facility];  Service: Endoscopy;;   REMOVAL OF STONES  06/11/2019   Procedure: REMOVAL OF STONES;  Surgeon: Alvis Jourdain, MD;  [facility]: [facility];  Service: Endoscopy;;   TOOTH EXTRACTION N/A 05/24/2022   Procedure: DENTAL RESTORATION/EXTRACTIONS;  Surgeon: Ascencion Lava, DMD;  Location: [facility];  Service: Oral Surgery;  Laterality: N/A;   TOTAL HIP ARTHROPLASTY Left 11/17/2022   Procedure: TOTAL HIP ARTHROPLASTY ANTERIOR APPROACH;  Surgeon: Saundra Curl, MD;  Location: [facility];  Service: Orthopedics;  Laterality: Left;   TUBAL LIGATION     UPPER ESOPHAGEAL ENDOSCOPIC ULTRASOUND (EUS) N/A 06/11/2019   Procedure: UPPER ESOPHAGEAL ENDOSCOPIC ULTRASOUND (EUS);  Surgeon: Alvis Jourdain, MD;  Location: [facility];  Service: Endoscopy;  Laterality: N/A;    Allergies  Allergies  Allergen Reactions   Ultram [Tramadol] Other (See Comments)    Possible seizures   Aleve [Naproxen] Other (See Comments)    Ulcers   Blueberry [Vaccinium Angustifolium] Swelling   Coconut (Cocos Nucifera) Swelling   Depakote  [Divalproex  Sodium] Other (See Comments)    Insomnia . (Changed from divalproex  to Lamictal  due to side effects)   Nsaids Other (See Comments)    History of bleeding  ulcers.   Pineapple Other (See Comments)    Migraine    Home Medications    Prior to Admission medications   Medication Sig Start Date End Date Taking? Authorizing Provider  albuterol  (VENTOLIN  HFA) 108 (90 Base) MCG/ACT inhaler Inhale 1-2 puffs into the lungs every 4 (four) hours as needed for wheezing or shortness of breath.    [provider]  ALPRAZolam  (XANAX ) 0.25 MG tablet Take 0.25-0.5 mg by mouth 4 (four) times daily as needed for anxiety or sleep. 01/23/23   [provider]  amitriptyline  (ELAVIL ) 25 MG tablet Take 50 mg by mouth at bedtime.    [provider]  aspirin  EC 81 MG tablet Take 1 tablet (81 mg total) by mouth 2 (two) times daily. To prevent blood clots for 30 days after surgery. Patient taking differently: Take 81 mg by mouth daily. To prevent blood clots for 30 days after surgery. 11/19/22   Lore Rode, PA-C  Compression Bandages KIT 1 each by Does not apply route daily. 04/14/23   Lasalle Pointer, NP  dexlansoprazole (DEXILANT) 60 MG capsule Take 60 mg by mouth daily.    [provider]  diltiazem  (CARDIZEM  CD) 180 MG 24 hr capsule TAKE ONE CAPSULE BY MOUTH EVERY DAY 09/15/23   Mallipeddi, Vishnu P, MD  folic acid  (FOLVITE ) 1  MG tablet Take 1 mg by mouth daily. 04/24/23   [provider]  furosemide  (LASIX ) 20 MG tablet Take 1 tablet (20 mg total) by mouth daily. 01/09/23 05/27/97  Mallipeddi, Kennyth Pean, MD  HYDROcodone -acetaminophen  (NORCO) 10-325 MG tablet Take 1 tablet by mouth 4 (four) times daily as needed for moderate pain (pain score 4-6) or severe pain (pain score 7-10). Can take to five times a day 03/10/23   [provider]  lamoTRIgine  (LAMICTAL ) 150 MG tablet Take 1 tablet (150 mg total) by mouth 2 (two) times daily. 10/02/23   Johny Nap, NP  levETIRAcetam  (KEPPRA ) 500 MG tablet Take 1 tablet (500 mg total) by mouth 2 (two) times daily. 10/02/23   Johny Nap, NP  linaclotide  (LINZESS ) 145 MCG CAPS  capsule Take 145 mcg by mouth daily before breakfast.    [provider]  meclizine  (ANTIVERT ) 25 MG tablet Take 1 tablet (25 mg total) by mouth 3 (three) times daily. 05/28/23   Mallipeddi, Vishnu P, MD  methocarbamol  (ROBAXIN ) 500 MG tablet Take 500 mg by mouth 3 (three) times daily. 03/28/23   [provider]  Milnacipran  (SAVELLA ) 50 MG TABS tablet Take 50 mg by mouth 2 (two) times daily.    [provider]  promethazine  (PHENERGAN ) 25 MG tablet Take 25 mg by mouth every 6 (six) hours as needed for nausea or vomiting.    [provider]  QULIPTA  60 MG TABS Take 1 tablet (60 mg total) by mouth daily. 10/02/23   Johny Nap, NP  rizatriptan (MAXALT) 10 MG tablet Take 10 mg by mouth See admin instructions. 10 mg as needed for migraine, may repeat 10 mg in 2 hours if headache persists or recurs.    [provider]    Physical Exam    Vital Signs:  Sharon Welch does not have vital signs available for review today.  Given telephonic nature of communication, physical exam is limited. AAOx3. NAD. Normal affect.  Speech and respirations are unlabored.   Assessment & Plan    Primary Cardiologist: Vishnu P Mallipeddi, MD  Preoperative cardiovascular risk assessment.  Left reverse total shoulder arthroplasty by Dr. Agatha Horsfall on 11/04/2023.  Chart reviewed as part of pre-operative protocol coverage. According to the RCRI, patient has a 0.4% risk of MACE. Patient reports activity equivalent to 6.27 METS (per [facility]).   Given past medical history and time since last visit, based on ACC/AHA guidelines, Sheridyn Satkowski would be at acceptable risk for the planned procedure without further cardiovascular testing.   Patient was advised that if she develops new symptoms prior to surgery to contact our office to arrange a follow-up appointment.  she verbalized understanding.  I will route this recommendation to the requesting party via Epic fax  function.  Please call with questions.  Time:   Today, I have spent 5 minutes with the patient with telehealth technology discussing medical history, symptoms, and management plan.     Morey Ar, NP  10/31/2023, 7:23 AM

## 2023-10-31 NOTE — Progress Notes (Signed)
 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 smoker with h/o seizures, asthma, left shoulder OA scheduled for above procedure 11/04/2023 with Dr. Osa Blase.   Per cardiology preoperative evaluation 10/31/2023, "Preoperative cardiovascular risk assessment.  Left reverse total shoulder arthroplasty by Dr. Agatha Horsfall on 11/04/2023.   Chart reviewed as part of pre-operative protocol coverage. According to the RCRI, patient has a 0.4% risk of MACE. Patient reports activity equivalent to 6.27 METS (per [facility]).    Given past medical history and time since last visit, based on ACC/AHA guidelines, Sharon Welch would be at acceptable risk for the planned procedure without further cardiovascular testing."  VS: BP (!) 142/94   Pulse 100   Temp 36.9 C (Oral)   Resp 18   Ht 5' (1.524 m)   Wt 45.4 kg   SpO2 100%   BMI 19.53 kg/m   PROVIDERS: Kathyleen Parkins, MD is PCP   Primary Cardiologist:  Lasalle Pointer, MD  LABS: Labs reviewed: Acceptable for surgery. (all labs ordered are listed, but only abnormal results are displayed)  Labs Reviewed  SURGICAL PCR SCREEN  BASIC METABOLIC PANEL WITH GFR  CBC     IMAGES:   EKG:   CV: Echo 01/21/2023 1. Left ventricular ejection fraction, by estimation, is 60 to 65%. The  left ventricle has normal function. The left ventricle has no regional  wall motion abnormalities. Left ventricular diastolic parameters are  consistent with Grade I diastolic  dysfunction (impaired relaxation). Elevated left ventricular end-diastolic  pressure. The E/e' is 18.   2. Right ventricular systolic function is normal. The right ventricular  size is normal.  There is normal pulmonary artery systolic pressure.   3. The mitral valve is normal in structure. Mild mitral valve  regurgitation. No evidence of mitral stenosis.   4. The aortic valve has an indeterminant number of cusps. Aortic valve  regurgitation is not visualized. No aortic stenosis is present.   5. The inferior vena cava is normal in size with greater than 50%  respiratory variability, suggesting right atrial pressure of 3 mmHg.   Myocardial Perfusion 12/16/2022    Stress ECG is negative for ischemia and arrhythmias.   LV perfusion is normal. There is no evidence of ischemia. There is no evidence of infarction.   Left ventricular function is normal. Nuclear stress EF: 70 %.   Findings are consistent with no ischemia. The study is low risk. Past Medical History:  Diagnosis Date   Asthma    seasonal   Bruising, spontaneous 12/08/2013   Cancer (HCC)    cervical cancer - had hysterectomy   Chronic back pain    Chronic back pain    DDD (degenerative disc disease), lumbar    DDD (degenerative disc disease), lumbar    Dysrhythmia    tachycardia   Fibromyalgia    Headache(784.0)    migraines   History of kidney stones    Ovarian cyst    Partial tear subscapularis tendon 12/27/2011   Scoliosis    Seizures (HCC)    started 9/12-    Past Surgical History:  Procedure Laterality Date   ABDOMINAL HYSTERECTOMY  2012   BACK SURGERY  1989  scoliosis throsic-rods   CESAREAN SECTION     CHOLECYSTECTOMY N/A 11/17/2012   Procedure: LAPAROSCOPIC CHOLECYSTECTOMY WITH INTRAOPERATIVE CHOLANGIOGRAM;  Surgeon: Shela Derby, MD;  Location: [facility];  Service: General;  Laterality: N/A;   COLONOSCOPY WITH PROPOFOL  N/A 04/21/2020   Procedure: COLONOSCOPY WITH PROPOFOL ;  Surgeon: Alvis Jourdain, MD;  Location: [facility];  Service: Endoscopy;  Laterality: N/A;   ENDOSCOPIC RETROGRADE CHOLANGIOPANCREATOGRAPHY (ERCP) WITH PROPOFOL  N/A 06/11/2019   Procedure: ENDOSCOPIC RETROGRADE  CHOLANGIOPANCREATOGRAPHY (ERCP) WITH PROPOFOL ;  Surgeon: Alvis Jourdain, MD;  [facility];  Service: Endoscopy;  Laterality: N/A;   ERCP N/A 05/07/2013   Procedure: ENDOSCOPIC RETROGRADE CHOLANGIOPANCREATOGRAPHY (ERCP);  Surgeon: Almeda Aris, MD;  Location: [facility];  Service: Endoscopy;  Laterality: N/A;  start ercp first, if canulation fails switch to EUS    ESOPHAGOGASTRODUODENOSCOPY (EGD) WITH PROPOFOL  N/A 05/15/2019   Procedure: ESOPHAGOGASTRODUODENOSCOPY (EGD) WITH PROPOFOL ;  Surgeon: Alyce Jubilee, MD;  [facility];  Service: Endoscopy;  Laterality: N/A;   ESOPHAGOGASTRODUODENOSCOPY (EGD) WITH PROPOFOL  N/A 03/03/2020   Procedure: ESOPHAGOGASTRODUODENOSCOPY (EGD) WITH PROPOFOL ;  Surgeon: Alvis Jourdain, MD;  [facility];  Service: Endoscopy;  Laterality: N/A;   ESOPHAGOGASTRODUODENOSCOPY (EGD) WITH PROPOFOL  N/A 04/21/2020   Procedure: ESOPHAGOGASTRODUODENOSCOPY (EGD) WITH PROPOFOL ;  Surgeon: Alvis Jourdain, MD;  [facility];  Service: Endoscopy;  Laterality: N/A;   EUS N/A 11/03/2012   Procedure: UPPER ENDOSCOPIC ULTRASOUND (EUS) LINEAR;  Surgeon: Almeda Aris, MD;  [facility];  Service: Endoscopy;  Laterality: N/A;   left arm     NECK SURGERY     POLYPECTOMY  04/21/2020   Procedure: POLYPECTOMY;  Surgeon: Alvis Jourdain, MD;  [facility];  Service: Endoscopy;;   REMOVAL OF STONES  06/11/2019   Procedure: REMOVAL OF STONES;  Surgeon: Alvis Jourdain, MD;  [facility];  Service: Endoscopy;;   TOOTH EXTRACTION N/A 05/24/2022   Procedure: DENTAL RESTORATION/EXTRACTIONS;  Surgeon: Ascencion Lava, DMD;  Location[facility];  Service: Oral Surgery;  Laterality: N/A;   TOTAL HIP ARTHROPLASTY Left 11/17/2022   Procedure: TOTAL HIP ARTHROPLASTY ANTERIOR APPROACH;  Surgeon: Saundra Curl, MD;  Location[facility];  Service: Orthopedics;  Laterality: Left;   TUBAL LIGATION     UPPER ESOPHAGEAL ENDOSCOPIC ULTRASOUND (EUS) N/A  06/11/2019   Procedure: UPPER ESOPHAGEAL ENDOSCOPIC ULTRASOUND (EUS);  Surgeon: Alvis Jourdain, MD;  Location: [facility];  Service: Endoscopy;  Laterality: N/A;    MEDICATIONS:  albuterol  (VENTOLIN  HFA) 108 (90 Base) MCG/ACT inhaler   ALPRAZolam  (XANAX ) 0.25 MG tablet   amitriptyline  (ELAVIL ) 25 MG tablet   aspirin  EC 81 MG tablet   Compression Bandages KIT   dexlansoprazole (DEXILANT) 60 MG capsule   diltiazem  (CARDIZEM  CD) 180 MG 24 hr capsule   folic acid  (FOLVITE ) 1 MG tablet   furosemide  (LASIX ) 20 MG tablet   HYDROcodone -acetaminophen  (NORCO) 10-325 MG tablet   lamoTRIgine  (LAMICTAL ) 150 MG tablet   levETIRAcetam  (KEPPRA ) 500 MG tablet   linaclotide  (LINZESS ) 145 MCG CAPS capsule   meclizine  (ANTIVERT ) 25 MG tablet   methocarbamol  (ROBAXIN ) 500 MG tablet   Milnacipran  (SAVELLA ) 50 MG TABS tablet   promethazine  (PHENERGAN ) 25 MG tablet   QULIPTA  60 MG TABS   rizatriptan (MAXALT) 10 MG tablet   No current facility-administered medications for this encounter.     Chick Cotton Ward, PA-C [facility] [phone]

## 2023-11-03 NOTE — Anesthesia Preprocedure Evaluation (Addendum)
 Anesthesia Evaluation  Patient identified by MRN, date of birth, ID band Patient awake    Reviewed: Allergy & Precautions, NPO status , Patient's Chart, lab work & pertinent test results  Airway Mallampati: II  TM Distance: >3 FB Neck ROM: Full    Dental no notable dental hx. (+) Edentulous Upper, Edentulous Lower   Pulmonary asthma , Current Smoker and Patient abstained from smoking.   Pulmonary exam normal breath sounds clear to auscultation       Cardiovascular (-) hypertension(-) CAD Normal cardiovascular exam Rhythm:Regular Rate:Normal     Neuro/Psych  Headaches, Seizures - (last 2months ago),     GI/Hepatic PUD,GERD  Medicated,,  Endo/Other    Renal/GU Lab Results      Component                Value               Date                         K                        4.5                 10/23/2023                CO2                      27                  10/23/2023                BUN                      14                  10/23/2023                CREATININE               0.73                10/23/2023                GFRNONAA                 >60                 10/23/2023                     Musculoskeletal  (+) Arthritis ,  Fibromyalgia -  Abdominal   Peds  Hematology Lab Results      Component                Value               Date                      WBC                      7.3                 10/23/2023                HGB  14.3                10/23/2023                HCT                      43.6                10/23/2023                MCV                      96.0                10/23/2023                PLT                      334                 10/23/2023              Anesthesia Other Findings All: Ultram, Aleve, Nsaids  Reproductive/Obstetrics                             Anesthesia Physical Anesthesia Plan  ASA: 2  Anesthesia Plan: General  and Regional   Post-op Pain Management: Regional block*   Induction: Intravenous  PONV Risk Score and Plan: 3 and Ondansetron , Treatment may vary due to age or medical condition, Dexamethasone  and Midazolam   Airway Management Planned: Oral ETT  Additional Equipment: None  Intra-op Plan:   Post-operative Plan: Extubation in OR  Informed Consent: I have reviewed the patients History and Physical, chart, labs and discussed the procedure including the risks, benefits and alternatives for the proposed anesthesia with the patient or authorized representative who has indicated his/her understanding and acceptance.     Dental advisory given  Plan Discussed with: Surgeon and CRNA  Anesthesia Plan Comments: (GA  + L ISB)       Anesthesia Quick Evaluation

## 2023-11-04 ENCOUNTER — Other Ambulatory Visit: Payer: Self-pay

## 2023-11-04 ENCOUNTER — Observation Stay (HOSPITAL_COMMUNITY)

## 2023-11-04 ENCOUNTER — Ambulatory Visit (HOSPITAL_BASED_OUTPATIENT_CLINIC_OR_DEPARTMENT_OTHER): Admitting: Anesthesiology

## 2023-11-04 ENCOUNTER — Encounter (HOSPITAL_COMMUNITY): Payer: Self-pay | Admitting: Orthopedic Surgery

## 2023-11-04 ENCOUNTER — Ambulatory Visit (HOSPITAL_COMMUNITY): Payer: Self-pay | Admitting: Physician Assistant

## 2023-11-04 ENCOUNTER — Observation Stay (HOSPITAL_COMMUNITY)
Admission: RE | Admit: 2023-11-04 | Discharge: 2023-11-05 | Disposition: A | Discharge status: Home / Self Care | Source: Ambulatory Visit | Attending: Orthopedic Surgery | Admitting: Orthopedic Surgery

## 2023-11-04 ENCOUNTER — Encounter (HOSPITAL_COMMUNITY)
Admission: RE | Disposition: A | Discharge status: Home / Self Care | Payer: Self-pay | Source: Ambulatory Visit | Attending: Orthopedic Surgery

## 2023-11-04 DIAGNOSIS — Z96612 Presence of left artificial shoulder joint: Principal | ICD-10-CM

## 2023-11-04 DIAGNOSIS — Z8541 Personal history of malignant neoplasm of cervix uteri: Secondary | ICD-10-CM | POA: Diagnosis not present

## 2023-11-04 DIAGNOSIS — M75102 Unspecified rotator cuff tear or rupture of left shoulder, not specified as traumatic: Secondary | ICD-10-CM | POA: Diagnosis not present

## 2023-11-04 DIAGNOSIS — M19012 Primary osteoarthritis, left shoulder: Secondary | ICD-10-CM | POA: Diagnosis not present

## 2023-11-04 DIAGNOSIS — Z7982 Long term (current) use of aspirin: Secondary | ICD-10-CM | POA: Diagnosis not present

## 2023-11-04 DIAGNOSIS — Z79899 Other long term (current) drug therapy: Secondary | ICD-10-CM | POA: Insufficient documentation

## 2023-11-04 DIAGNOSIS — Z96642 Presence of left artificial hip joint: Secondary | ICD-10-CM | POA: Diagnosis not present

## 2023-11-04 DIAGNOSIS — J45909 Unspecified asthma, uncomplicated: Secondary | ICD-10-CM | POA: Insufficient documentation

## 2023-11-04 DIAGNOSIS — F17210 Nicotine dependence, cigarettes, uncomplicated: Secondary | ICD-10-CM | POA: Insufficient documentation

## 2023-11-04 HISTORY — PX: TOTAL SHOULDER ARTHROPLASTY: SHX126

## 2023-11-04 SURGERY — ARTHROPLASTY, SHOULDER, TOTAL
Anesthesia: Regional | Site: Shoulder | Laterality: Left

## 2023-11-04 MED ORDER — DOCUSATE SODIUM 100 MG PO CAPS
100.0000 mg | ORAL_CAPSULE | Freq: Two times a day (BID) | ORAL | Status: DC
  Administered 2023-11-04 – 2023-11-05 (×2): 100 mg via ORAL
  Filled 2023-11-04 (×2): qty 1

## 2023-11-04 MED ORDER — METOCLOPRAMIDE HCL 5 MG PO TABS: 5.0000 mg | ORAL_TABLET | Freq: Three times a day (TID) | ORAL | Status: DC | PRN

## 2023-11-04 MED ORDER — DILTIAZEM HCL ER COATED BEADS 180 MG PO CP24
180.0000 mg | ORAL_CAPSULE | Freq: Every day | ORAL | Status: DC
  Administered 2023-11-05: 180 mg via ORAL
  Filled 2023-11-04: qty 1

## 2023-11-04 MED ORDER — MENTHOL 3 MG MT LOZG: 1.0000 | LOZENGE | OROMUCOSAL | Status: DC | PRN

## 2023-11-04 MED ORDER — BUPIVACAINE HCL (PF) 0.5 % IJ SOLN
INTRAMUSCULAR | Status: DC | PRN
Start: 2023-11-04 — End: 2023-11-04
  Administered 2023-11-04: 12 mL via PERINEURAL

## 2023-11-04 MED ORDER — PHENYLEPHRINE HCL-NACL 20-0.9 MG/250ML-% IV SOLN
INTRAVENOUS | Status: DC | PRN
  Administered 2023-11-04: 25 ug/min via INTRAVENOUS

## 2023-11-04 MED ORDER — OXYCODONE HCL 5 MG PO TABS
5.0000 mg | ORAL_TABLET | ORAL | Status: DC | PRN
  Administered 2023-11-04 – 2023-11-05 (×2): 5 mg via ORAL
  Filled 2023-11-04 (×2): qty 1

## 2023-11-04 MED ORDER — EPHEDRINE 5 MG/ML INJ
INTRAVENOUS | Status: AC
  Filled 2023-11-04: qty 5

## 2023-11-04 MED ORDER — BUPIVACAINE LIPOSOME 1.3 % IJ SUSP
INTRAMUSCULAR | Status: DC | PRN
  Administered 2023-11-04: 10 mL via PERINEURAL

## 2023-11-04 MED ORDER — FENTANYL CITRATE (PF) 100 MCG/2ML IJ SOLN
INTRAMUSCULAR | Status: DC | PRN
Start: 2023-11-04 — End: 2023-11-04
  Administered 2023-11-04: 50 ug via INTRAVENOUS
  Administered 2023-11-04 (×2): 25 ug via INTRAVENOUS

## 2023-11-04 MED ORDER — AMITRIPTYLINE HCL 50 MG PO TABS
50.0000 mg | ORAL_TABLET | Freq: Every day | ORAL | Status: DC
  Administered 2023-11-04: 50 mg via ORAL
  Filled 2023-11-04: qty 1

## 2023-11-04 MED ORDER — PHENOL 1.4 % MT LIQD: 1.0000 | OROMUCOSAL | Status: DC | PRN

## 2023-11-04 MED ORDER — TRANEXAMIC ACID-NACL 1000-0.7 MG/100ML-% IV SOLN
1000.0000 mg | INTRAVENOUS | Status: AC
  Administered 2023-11-04: 1000 mg via INTRAVENOUS
  Filled 2023-11-04: qty 100

## 2023-11-04 MED ORDER — MAGNESIUM CITRATE PO SOLN: 1.0000 | Freq: Once | ORAL | Status: DC | PRN

## 2023-11-04 MED ORDER — MILNACIPRAN HCL 50 MG PO TABS
50.0000 mg | ORAL_TABLET | Freq: Two times a day (BID) | ORAL | Status: DC
  Administered 2023-11-04 – 2023-11-05 (×2): 50 mg via ORAL
  Filled 2023-11-04 (×2): qty 1

## 2023-11-04 MED ORDER — LIDOCAINE HCL (PF) 2 % IJ SOLN
INTRAMUSCULAR | Status: AC
Start: 2023-11-04 — End: ?
  Filled 2023-11-04: qty 10

## 2023-11-04 MED ORDER — PROMETHAZINE HCL 25 MG PO TABS: 25.0000 mg | ORAL_TABLET | Freq: Four times a day (QID) | ORAL | Status: DC | PRN

## 2023-11-04 MED ORDER — CEFAZOLIN SODIUM-DEXTROSE 2-4 GM/100ML-% IV SOLN
2.0000 g | INTRAVENOUS | Status: AC
  Administered 2023-11-04: 2 g via INTRAVENOUS
  Filled 2023-11-04: qty 100

## 2023-11-04 MED ORDER — LINACLOTIDE 145 MCG PO CAPS
145.0000 ug | ORAL_CAPSULE | Freq: Every day | ORAL | Status: DC
  Administered 2023-11-05: 145 ug via ORAL
  Filled 2023-11-04: qty 1

## 2023-11-04 MED ORDER — LEVETIRACETAM 500 MG PO TABS
500.0000 mg | ORAL_TABLET | Freq: Two times a day (BID) | ORAL | Status: DC
Start: 2023-11-04 — End: 2023-11-05
  Administered 2023-11-04 – 2023-11-05 (×2): 500 mg via ORAL
  Filled 2023-11-04 (×2): qty 1

## 2023-11-04 MED ORDER — HYDROMORPHONE HCL 1 MG/ML IJ SOLN: 0.5000 mg | INTRAMUSCULAR | Status: DC | PRN

## 2023-11-04 MED ORDER — MIDAZOLAM HCL 2 MG/2ML IJ SOLN
1.0000 mg | INTRAMUSCULAR | Status: DC
  Administered 2023-11-04: 1 mg via INTRAVENOUS
  Filled 2023-11-04: qty 2

## 2023-11-04 MED ORDER — PHENYLEPHRINE 80 MCG/ML (10ML) SYRINGE FOR IV PUSH (FOR BLOOD PRESSURE SUPPORT)
PREFILLED_SYRINGE | INTRAVENOUS | Status: AC
  Filled 2023-11-04: qty 10

## 2023-11-04 MED ORDER — CHLORHEXIDINE GLUCONATE 0.12 % MT SOLN
15.0000 mL | Freq: Once | OROMUCOSAL | Status: AC
  Administered 2023-11-04: 15 mL via OROMUCOSAL

## 2023-11-04 MED ORDER — ATOGEPANT 60 MG PO TABS: 1.0000 | ORAL_TABLET | Freq: Every day | ORAL | Status: DC

## 2023-11-04 MED ORDER — FENTANYL CITRATE PF 50 MCG/ML IJ SOSY
50.0000 ug | PREFILLED_SYRINGE | INTRAMUSCULAR | Status: DC
  Administered 2023-11-04: 50 ug via INTRAVENOUS
  Filled 2023-11-04: qty 2

## 2023-11-04 MED ORDER — POLYETHYLENE GLYCOL 3350 17 G PO PACK: 17.0000 g | PACK | Freq: Every day | ORAL | Status: DC | PRN

## 2023-11-04 MED ORDER — ROCURONIUM BROMIDE 100 MG/10ML IV SOLN
INTRAVENOUS | Status: DC | PRN
  Administered 2023-11-04: 30 mg via INTRAVENOUS

## 2023-11-04 MED ORDER — METOCLOPRAMIDE HCL 5 MG/ML IJ SOLN: 5.0000 mg | Freq: Three times a day (TID) | INTRAMUSCULAR | Status: DC | PRN

## 2023-11-04 MED ORDER — ORAL CARE MOUTH RINSE: 15.0000 mL | OROMUCOSAL | Status: DC | PRN

## 2023-11-04 MED ORDER — PANTOPRAZOLE SODIUM 40 MG PO TBEC
40.0000 mg | DELAYED_RELEASE_TABLET | Freq: Every day | ORAL | Status: DC
  Administered 2023-11-05: 40 mg via ORAL
  Filled 2023-11-04: qty 1

## 2023-11-04 MED ORDER — POTASSIUM CHLORIDE IN NACL 20-0.9 MEQ/L-% IV SOLN
INTRAVENOUS | Status: DC
  Filled 2023-11-04 (×2): qty 1000

## 2023-11-04 MED ORDER — MECLIZINE HCL 25 MG PO TABS
25.0000 mg | ORAL_TABLET | Freq: Three times a day (TID) | ORAL | Status: DC
  Administered 2023-11-04 – 2023-11-05 (×3): 25 mg via ORAL
  Filled 2023-11-04 (×3): qty 1

## 2023-11-04 MED ORDER — POVIDONE-IODINE 10 % EX SWAB: 2.0000 | Freq: Once | CUTANEOUS | Status: DC

## 2023-11-04 MED ORDER — ROCURONIUM BROMIDE 10 MG/ML (PF) SYRINGE
PREFILLED_SYRINGE | INTRAVENOUS | Status: AC
Start: 2023-11-04 — End: ?
  Filled 2023-11-04: qty 10

## 2023-11-04 MED ORDER — EPHEDRINE 5 MG/ML INJ
INTRAVENOUS | Status: AC
  Filled 2023-11-04: qty 15

## 2023-11-04 MED ORDER — LACTATED RINGERS IV SOLN: INTRAVENOUS | Status: DC

## 2023-11-04 MED ORDER — OXYCODONE HCL 5 MG PO TABS
10.0000 mg | ORAL_TABLET | ORAL | Status: DC | PRN
  Administered 2023-11-05: 10 mg via ORAL
  Filled 2023-11-04: qty 2

## 2023-11-04 MED ORDER — DIPHENHYDRAMINE HCL 12.5 MG/5ML PO ELIX: 12.5000 mg | ORAL_SOLUTION | ORAL | Status: DC | PRN

## 2023-11-04 MED ORDER — ACETAMINOPHEN 500 MG PO TABS
1000.0000 mg | ORAL_TABLET | Freq: Once | ORAL | Status: AC
  Administered 2023-11-04: 1000 mg via ORAL
  Filled 2023-11-04: qty 2

## 2023-11-04 MED ORDER — ACETAMINOPHEN 325 MG PO TABS: 325.0000 mg | ORAL_TABLET | Freq: Four times a day (QID) | ORAL | Status: DC | PRN

## 2023-11-04 MED ORDER — SUMATRIPTAN SUCCINATE 50 MG PO TABS
100.0000 mg | ORAL_TABLET | ORAL | Status: DC | PRN
  Administered 2023-11-04: 100 mg via ORAL
  Filled 2023-11-04 (×2): qty 2

## 2023-11-04 MED ORDER — ACETAMINOPHEN 500 MG PO TABS
1000.0000 mg | ORAL_TABLET | Freq: Four times a day (QID) | ORAL | Status: DC
  Administered 2023-11-04 – 2023-11-05 (×3): 1000 mg via ORAL
  Filled 2023-11-04 (×3): qty 2

## 2023-11-04 MED ORDER — ALBUTEROL SULFATE (2.5 MG/3ML) 0.083% IN NEBU
2.5000 mg | INHALATION_SOLUTION | RESPIRATORY_TRACT | Status: DC | PRN
  Administered 2023-11-04: 2.5 mg via RESPIRATORY_TRACT
  Filled 2023-11-04: qty 3

## 2023-11-04 MED ORDER — CEFAZOLIN SODIUM-DEXTROSE 2-4 GM/100ML-% IV SOLN
2.0000 g | Freq: Four times a day (QID) | INTRAVENOUS | Status: AC
  Administered 2023-11-04 (×2): 2 g via INTRAVENOUS
  Filled 2023-11-04 (×3): qty 100

## 2023-11-04 MED ORDER — ORAL CARE MOUTH RINSE: 15.0000 mL | Freq: Once | OROMUCOSAL | Status: AC

## 2023-11-04 MED ORDER — SUGAMMADEX SODIUM 200 MG/2ML IV SOLN
INTRAVENOUS | Status: DC | PRN
  Administered 2023-11-04: 150 mg via INTRAVENOUS

## 2023-11-04 MED ORDER — FUROSEMIDE 20 MG PO TABS
20.0000 mg | ORAL_TABLET | Freq: Every day | ORAL | Status: DC
  Administered 2023-11-05: 20 mg via ORAL
  Filled 2023-11-04: qty 1

## 2023-11-04 MED ORDER — METHOCARBAMOL 500 MG PO TABS
500.0000 mg | ORAL_TABLET | Freq: Three times a day (TID) | ORAL | Status: DC
  Administered 2023-11-04 – 2023-11-05 (×3): 500 mg via ORAL
  Filled 2023-11-04 (×3): qty 1

## 2023-11-04 MED ORDER — BISACODYL 10 MG RE SUPP: 10.0000 mg | Freq: Every day | RECTAL | Status: DC | PRN

## 2023-11-04 MED ORDER — LACTATED RINGERS IV SOLN: INTRAVENOUS | Status: DC | PRN

## 2023-11-04 MED ORDER — ALPRAZOLAM 0.25 MG PO TABS
0.2500 mg | ORAL_TABLET | Freq: Four times a day (QID) | ORAL | Status: DC | PRN
Start: 2023-11-04 — End: 2023-11-05

## 2023-11-04 MED ORDER — DEXAMETHASONE SODIUM PHOSPHATE 10 MG/ML IJ SOLN
INTRAMUSCULAR | Status: DC | PRN
  Administered 2023-11-04: 8 mg via INTRAVENOUS

## 2023-11-04 MED ORDER — LAMOTRIGINE 25 MG PO TABS
150.0000 mg | ORAL_TABLET | Freq: Two times a day (BID) | ORAL | Status: DC
  Administered 2023-11-04 – 2023-11-05 (×2): 150 mg via ORAL
  Filled 2023-11-04 (×2): qty 2

## 2023-11-04 MED ORDER — 0.9 % SODIUM CHLORIDE (POUR BTL) OPTIME
TOPICAL | Status: DC | PRN
  Administered 2023-11-04: 1000 mL

## 2023-11-04 MED ORDER — TRANEXAMIC ACID-NACL 1000-0.7 MG/100ML-% IV SOLN
1000.0000 mg | Freq: Once | INTRAVENOUS | Status: AC
  Administered 2023-11-04: 1000 mg via INTRAVENOUS
  Filled 2023-11-04: qty 100

## 2023-11-04 MED ORDER — SUMATRIPTAN SUCCINATE 50 MG PO TABS: 100.0000 mg | ORAL_TABLET | ORAL | Status: DC | PRN

## 2023-11-04 MED ORDER — ALPRAZOLAM 0.25 MG PO TABS: 0.2500 mg | ORAL_TABLET | Freq: Four times a day (QID) | ORAL | Status: DC | PRN

## 2023-11-04 MED ORDER — FENTANYL CITRATE (PF) 100 MCG/2ML IJ SOLN
INTRAMUSCULAR | Status: AC
  Filled 2023-11-04: qty 2

## 2023-11-04 MED ORDER — LIDOCAINE HCL (CARDIAC) PF 100 MG/5ML IV SOSY
PREFILLED_SYRINGE | INTRAVENOUS | Status: DC | PRN
  Administered 2023-11-04: 60 mg via INTRAVENOUS

## 2023-11-04 MED ORDER — PROPOFOL 10 MG/ML IV BOLUS
INTRAVENOUS | Status: DC | PRN
  Administered 2023-11-04: 90 mg via INTRAVENOUS

## 2023-11-04 MED ORDER — EPHEDRINE SULFATE (PRESSORS) 50 MG/ML IJ SOLN
INTRAMUSCULAR | Status: DC | PRN
  Administered 2023-11-04: 10 mg via INTRAVENOUS

## 2023-11-04 MED ORDER — DEXMEDETOMIDINE HCL IN NACL 80 MCG/20ML IV SOLN
INTRAVENOUS | Status: DC | PRN
  Administered 2023-11-04: 8 ug via INTRAVENOUS

## 2023-11-04 MED ORDER — ALUM & MAG HYDROXIDE-SIMETH 200-200-20 MG/5ML PO SUSP: 30.0000 mL | ORAL | Status: DC | PRN

## 2023-11-04 MED ORDER — ONDANSETRON HCL 4 MG/2ML IJ SOLN
INTRAMUSCULAR | Status: DC | PRN
  Administered 2023-11-04: 4 mg via INTRAVENOUS

## 2023-11-04 SURGICAL SUPPLY — 62 items
BAG COUNTER SPONGE SURGICOUNT (BAG) IMPLANT
BAG ZIPLOCK 12X15 (MISCELLANEOUS) ×1 IMPLANT
BASEPLATE GLENOSPHERE 25 (Plate) IMPLANT
BEARING HUMERAL SHLDER 36M STD (Shoulder) IMPLANT
BIT DRILL TWIST 2.7 (BIT) IMPLANT
BLADE SAW SGTL 73X25 THK (BLADE) ×1 IMPLANT
CEMENT BONE R 1X40 (Cement) IMPLANT
CLSR STERI-STRIP ANTIMIC 1/2X4 (GAUZE/BANDAGES/DRESSINGS) ×1 IMPLANT
COOLER ICEMAN CLASSIC (MISCELLANEOUS) ×1 IMPLANT
COVER BACK TABLE 60X90IN (DRAPES) ×1 IMPLANT
COVER MAYO STAND STRL (DRAPES) ×1 IMPLANT
COVER SURGICAL LIGHT HANDLE (MISCELLANEOUS) ×1 IMPLANT
DRAPE IMP U-DRAPE 54X76 (DRAPES) ×2 IMPLANT
DRAPE POUCH INSTRU U-SHP 10X18 (DRAPES) ×1 IMPLANT
DRAPE SHEET LG 3/4 BI-LAMINATE (DRAPES) ×2 IMPLANT
DRAPE SURG 17X11 SM STRL (DRAPES) ×1 IMPLANT
DRAPE TOP 10253 STERILE (DRAPES) IMPLANT
DRAPE U-SHAPE 47X51 STRL (DRAPES) ×1 IMPLANT
DRESSING AQUACEL AG SP 3.5X6 (GAUZE/BANDAGES/DRESSINGS) IMPLANT
DRSG MEPILEX POST OP 4X8 (GAUZE/BANDAGES/DRESSINGS) ×1 IMPLANT
DURAPREP 26ML APPLICATOR (WOUND CARE) ×2 IMPLANT
ELECT REM PT RETURN 15FT ADLT (MISCELLANEOUS) ×1 IMPLANT
FACESHIELD WRAPAROUND (MASK) ×1 IMPLANT
FACESHIELD WRAPAROUND OR TEAM (MASK) ×1 IMPLANT
GLENOID SPHERE STD STRL 36MM (Orthopedic Implant) IMPLANT
GLOVE BIO SURGEON STRL SZ 6.5 (GLOVE) ×1 IMPLANT
GLOVE BIOGEL PI IND STRL 7.0 (GLOVE) ×1 IMPLANT
GLOVE BIOGEL PI IND STRL 8 (GLOVE) ×1 IMPLANT
GLOVE ORTHO TXT STRL SZ7.5 (GLOVE) ×1 IMPLANT
GOWN STRL SURGICAL XL XLNG (GOWN DISPOSABLE) ×2 IMPLANT
GUIDE GLENOID SHLD MODEL LT (ORTHOPEDIC DISPOSABLE SUPPLIES) IMPLANT
HOOD PEEL AWAY T7 (MISCELLANEOUS) ×2 IMPLANT
KIT BASIN OR (CUSTOM PROCEDURE TRAY) ×1 IMPLANT
KIT TURNOVER KIT A (KITS) IMPLANT
NS IRRIG 1000ML POUR BTL (IV SOLUTION) ×1 IMPLANT
PACK SHOULDER (CUSTOM PROCEDURE TRAY) ×1 IMPLANT
PAD ARMBOARD POSITIONER FOAM (MISCELLANEOUS) ×1 IMPLANT
PAD COLD SHLDR WRAP-ON (PAD) ×1 IMPLANT
PIN HUMERAL STMN 3.2MMX9IN (INSTRUMENTS) IMPLANT
PIN STEINMANN THREADED TIP (PIN) IMPLANT
RESTRAINT HEAD UNIVERSAL NS (MISCELLANEOUS) ×1 IMPLANT
SCREW BONE STRL 6.5MMX30MM (Screw) IMPLANT
SCREW LOCKING 4.75MMX15MM (Screw) IMPLANT
SCREW LOCKING NS 4.75MMX20MM (Screw) IMPLANT
SET HNDPC FAN SPRY TIP SCT (DISPOSABLE) ×1 IMPLANT
SLING ARM IMMOBILIZER LRG (SOFTGOODS) ×1 IMPLANT
SLING ARM IMMOBILIZER MED (SOFTGOODS) IMPLANT
SPIKE FLUID TRANSFER (MISCELLANEOUS) IMPLANT
STEM HUMERAL STRL 5MMX55MM (Stem) IMPLANT
SUCTION TUBE FRAZIER 12FR DISP (SUCTIONS) ×1 IMPLANT
SUPPORT WRAP ARM LG (MISCELLANEOUS) ×1 IMPLANT
SUT MAXBRAID #2 CVD NDL (SUTURE) ×1 IMPLANT
SUT MAXBRAID #5 CCS-NDL 2PK (SUTURE) ×2 IMPLANT
SUT MNCRL AB 3-0 PS2 18 (SUTURE) IMPLANT
SUT VIC AB 0 CT1 36 (SUTURE) ×1 IMPLANT
SUT VIC AB 2-0 CT1 TAPERPNT 27 (SUTURE) ×1 IMPLANT
SUT VIC AB 3-0 SH 27X BRD (SUTURE) IMPLANT
TOWEL OR 17X26 10 PK STRL BLUE (TOWEL DISPOSABLE) ×1 IMPLANT
TOWER SMARTMIX MINI (MISCELLANEOUS) IMPLANT
TRAY HUM MINI SHOULDER +3 40 (Joint) IMPLANT
TUBE SUCTION HIGH CAP CLEAR NV (SUCTIONS) IMPLANT
WATER STERILE IRR 1000ML POUR (IV SOLUTION) ×2 IMPLANT

## 2023-11-04 NOTE — Plan of Care (Signed)
  Problem: Education: Goal: Knowledge of General Education information will improve Description: Including pain rating scale, medication(s)/side effects and non-pharmacologic comfort measures Outcome: Progressing   Problem: Health Behavior/Discharge Planning: Goal: Ability to manage health-related needs will improve Outcome: Adequate for Discharge   Problem: Clinical Measurements: Goal: Ability to maintain clinical measurements within normal limits will improve Outcome: Progressing Goal: Will remain free from infection Outcome: Progressing Goal: Diagnostic test results will improve Outcome: Progressing Goal: Respiratory complications will improve Outcome: Progressing Goal: Cardiovascular complication will be avoided Outcome: Progressing   Problem: Activity: Goal: Risk for activity intolerance will decrease Outcome: Progressing   Problem: Nutrition: Goal: Adequate nutrition will be maintained Outcome: Completed/Met   Problem: Coping: Goal: Level of anxiety will decrease Outcome: Progressing   Problem: Elimination: Goal: Will not experience complications related to bowel motility Outcome: Progressing Goal: Will not experience complications related to urinary retention Outcome: Completed/Met   Problem: Pain Managment: Goal: General experience of comfort will improve and/or be controlled Outcome: Progressing   Problem: Safety: Goal: Ability to remain free from injury will improve Outcome: Progressing   Problem: Skin Integrity: Goal: Risk for impaired skin integrity will decrease Outcome: Progressing   Problem: Education: Goal: Knowledge of the prescribed therapeutic regimen will improve Outcome: Progressing Goal: Understanding of activity limitations/precautions following surgery will improve Outcome: Progressing Goal: Individualized Educational Video(s) Outcome: Completed/Met   Problem: Activity: Goal: Ability to tolerate increased activity will  improve Outcome: Adequate for Discharge   Problem: Pain Management: Goal: Pain level will decrease with appropriate interventions Outcome: Progressing

## 2023-11-04 NOTE — Anesthesia Procedure Notes (Signed)
 Anesthesia Regional Block: Interscalene brachial plexus block   Pre-Anesthetic Checklist: , timeout performed,  Correct Patient, Correct Site, Correct Laterality,  Correct Procedure, Correct Position, site marked,  Risks and benefits discussed,  Surgical consent,  Pre-op evaluation,  At surgeon's request and post-op pain management  Laterality: Upper and Left  Prep: Maximum Sterile Barrier Precautions used, chloraprep       Needles:  Injection technique: Single-shot  Needle Type: Echogenic Needle     Needle Length: 5cm  Needle Gauge: 21     Additional Needles:   Procedures:,,,, ultrasound used (permanent image in chart),,    Narrative:  Start time: 11/04/2023 10:22 AM End time: 11/04/2023 10:28 AM Injection made incrementally with aspirations every 5 mL.  Performed by: Personally  Anesthesiologist: [name], MD  Additional Notes: Block assessed prior to procedure. Patient tolerated procedure well.

## 2023-11-04 NOTE — Transfer of Care (Signed)
 Immediate Anesthesia Transfer of Care Note  Patient: Sharon Welch  Procedure(s) Performed: ARTHROPLASTY, SHOULDER, TOTAL (Left: Shoulder)  Patient Location: PACU  Anesthesia Type:general  Level of Consciousness: awake and alert   Airway & Oxygen  Therapy: Patient Spontanous Breathing and Patient connected to nasal cannula oxygen   Post-op Assessment: Report given to RN and Post -op Vital signs reviewed and stable  Post vital signs: Reviewed and stable  Last Vitals:  Vitals Value Taken Time  BP 109/74 11/04/23 1353  Temp    Pulse 75 11/04/23 1353  Resp    SpO2 100 % 11/04/23 1353  Vitals shown include unfiled device data.  Last Pain:  Vitals:   11/04/23 0951  TempSrc:   PainSc: 3          Complications: No notable events documented.

## 2023-11-04 NOTE — Op Note (Signed)
 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  ANESTHESIA:   General with interscalene block using Exparel   ESTIMATED BLOOD LOSS: 150 mL  UNIQUE ASPECTS OF THE CASE: As predicted by the preoperative CT scan, the anatomy was exceedingly small.  The mini baseplate nearly overhung both anteriorly and posteriorly, and for this reason I only used the superior and inferior screws.  Remarkably, I had nearly a 35 mm central screw, I think I must of been directly down the [place], I only used a 30 however.  I had excellent fixation with that central screw, the superior screw was only 15 mm, and the inferior screw was just shy of 20, so I used a 20 mm for bicortical fixation.  The baseplate felt extremely stable despite how small the bone was.  I did make a moderately aggressive cut on the humeral head, in order to ensure adequate access to the glenoid.  In trialing, the +3 height and +3 offset felt good, but the 4 broach was a little rotationally unstable, so I went with the 5, which did not sit down as deep as the 4, and so I ended up using a +0 height, +3 offset, and had excellent stability with reduction.  The subscapularis tissue seemed to be extremely poor quality, and not really amenable to repair.  The biceps tendon was also not really visualized.  The subscapularis seemed intact.  PREOPERATIVE INDICATIONS:  Sharon Welch is a  53 y.o. female with a diagnosis of LEFT SHOULDER OA who failed conservative measures and elected for surgical management.    The risks benefits and alternatives were discussed  with the patient preoperatively including but not limited to the risks of infection, bleeding, nerve injury, cardiopulmonary complications, the need for revision surgery, dislocation, brachial plexus palsy, incomplete relief of pain, among others, and the patient was willing to proceed.  OPERATIVE IMPLANTS:   Implant Name: BASEPLATE GLENOSPHERE 25 - [id number] Type: Plate Inv. Item: BASEPLATE GLENOSPHERE 25 Serial No.:  Manufacturer: [employer](ORTH,TRAU,BIO,SG) Lot No.: [id number] LRB: Left No. Used: 1 Action: Implanted   Implant Name: SCREW BONE STRL 6.5MMX30MM - [id number] Type: Screw Inv. Item: SCREW BONE STRL 6.5MMX30MM Serial No.:  Manufacturer: [employer](ORTH,TRAU,BIO,SG) Lot No.: [id number] LRB: Left No. Used: 1 Action: Implanted   Implant Name: Saddie Crane STD STRL - [id number] Type: Orthopedic Implant Inv. Item: GLENOID SPHERE STD STRL Serial No.:  Manufacturer: [employer](ORTH,TRAU,BIO,SG) Lot No.: [id number] LRB: Left No. Used: 1 Action: Implanted   Implant NameVela Gerhard 4.75MMX15MM - [id number] Type: Screw Inv. Item: SCREW LOCKING 4.75MMX15MM Serial No.:  Manufacturer: [employer](ORTH,TRAU,BIO,SG) Lot No.: [id number] LRB: Left No. Used: 1 Action: Implanted   Implant Name: SCREW LOCKING NS 4.15VVO16WV - [id number] Type: Screw Inv. Item: SCREW LOCKING NS [id number] Serial No.:  Manufacturer: [employer](ORTH,TRAU,BIO,SG) Lot No.: [id number] LRB: Left No. Used: 1 Action: Implanted   Implant Name: STEM HUMERAL STRL 5MMX55MM - [id number] Type: Stem Inv. Item: STEM HUMERAL STRL [id number] Serial No.:  Manufacturer: [employer](ORTH,TRAU,BIO,SG) Lot No.: [id number] LRB: Left No. Used: 1 Action: Implanted   Implant Name: TRAY HUM MINI SHOULDER +3 40 - [id number] Type: Joint Inv.  Item: TRAY HUM MINI SHOULDER +3 40 Serial No.:  Manufacturer: [employer](ORTH,TRAU,BIO,SG) Lot No.: [id number] LRB: Left No. Used: 1 Action:  Implanted   Implant Name: BEARING HUMERAL SHLDER 68M STD - [id number] Type: Shoulder Inv. Item: BEARING HUMERAL SHLDER 68M STD Serial No.:  Manufacturer: [employer](ORTH,TRAU,BIO,SG) Lot No.:  [id number]: Left No. Used: 1 Action: Implanted   OPERATIVE FINDINGS: Irreparable subscapularis tear with retraction and atrophy  OPERATIVE PROCEDURE: The patient was brought to the operating room and placed in the supine position. General anesthesia was administered. IV antibiotics were given.  Time out was performed. The upper extremity was prepped and draped in usual sterile fashion. The patient was in a beachchair position. Deltopectoral approach was carried out.  There was really no biceps to repair, and the subscapularis was released inferiorly off of the bone, although it was questionable if this was actually tendinous tissue versus simply scar.   I then performed circumferential releases of the humerus, and then dislocated the head, and then reamed with the reamer to the above named size.  I then applied the jig, and cut the humeral head in 30 of retroversion, and then turned my attention to the glenoid.  Deep retractors were placed, and I resected the labrum, and then placed a guidepin into the center position on the glenoid, with slight inferior inclination. I then reamed over the guidepin, and this created a small metaphyseal cancellus blush inferiorly, removing just the cartilage to the subchondral bone superiorly. The base plate was selected and impacted place, and then I secured it centrally with a nonlocking screw, and I had excellent purchase both inferiorly and superiorly.  The anterior and posterior aspects of the baseplate were at the margins of the bone, so I did not place screws in those locations.  I did have 100% contact of the baseplate.  I then turned my attention to the glenosphere, and impacted this into place, placing slight inferior offset (set on B).   The glenosphere was  completely seated, and had engagement of the [facility] taper. I then turned my attention back to the humerus.  I sequentially broached, and then trialed, and was found to restore soft tissue tension, and it had 2 finger tightness. Therefore the above named components were selected. The shoulder felt stable throughout functional motion.  I then impacted the real prosthesis into place, as well as the real humeral tray, and reduced the shoulder. The shoulder had excellent motion, and was stable, and I irrigated the wounds copiously.   I then irrigated the shoulder copiously once more, repaired the deltopectoral interval with Vicryl followed by subcutaneous Vicryl with Steri-Strips and sterile gauze for the skin. The patient was awakened and returned back in stable and satisfactory condition. There were no complications and She tolerated the procedure well.

## 2023-11-04 NOTE — Plan of Care (Signed)

## 2023-11-04 NOTE — Anesthesia Procedure Notes (Signed)
 Procedure Name: Intubation Date/Time: 11/04/2023 12:12 PM  Performed by: Hunter Maha, CRNAPre-anesthesia Checklist: Patient identified, Emergency Drugs available, Suction available and Patient being monitored Patient Re-evaluated:Patient Re-evaluated prior to induction Oxygen  Delivery Method: Circle system utilized Preoxygenation: Pre-oxygenation with 100% oxygen  Induction Type: IV induction Ventilation: Mask ventilation without difficulty Grade View: Grade I Tube type: Oral Tube size: 7.0 mm Number of attempts: 1 Airway Equipment and Method: Stylet and Oral airway Placement Confirmation: ETT inserted through vocal cords under direct vision, positive ETCO2 and breath sounds checked- equal and bilateral Secured at: 19 cm Tube secured with: Tape Dental Injury: Teeth and Oropharynx as per pre-operative assessment

## 2023-11-04 NOTE — Interval H&P Note (Signed)
 History and Physical Interval Note:  11/04/2023 11:32 AM  Sharon Welch  has presented today for surgery, with the diagnosis of LEFT SHOULDER OA.  The various methods of treatment have been discussed with the patient and family. After consideration of risks, benefits and other options for treatment, the patient has consented to  Procedure(s): ARTHROPLASTY, SHOULDER, TOTAL (Left) reverse as a surgical intervention.  The patient's history has been reviewed, patient examined, no change in status, stable for surgery.  I have reviewed the patient's chart and labs.  Questions were answered to the patient's satisfaction.     Neville Barbone

## 2023-11-04 NOTE — Discharge Instructions (Signed)
 POST-OPERATIVE INSTRUCTIONS - TOTAL SHOULDER REPLACEMENT    WOUND CARE You may leave the operative dressing in place until your follow-up appointment. KEEP THE INCISIONS CLEAN AND DRY. There may be a small amount of fluid/bleeding leaking at the surgical site. This is normal after surgery.  If it fills with liquid or blood please call us  immediately to change it for you. Use the provided ice machine or Ice packs as often as possible for the first 3-4 days, then as needed for pain relief.   Keep a layer of cloth or a shirt between your skin and the cooling unit to prevent frost bite as it can get very cold.  SHOWERING: - You may shower on Post-Op Day #3.  - The dressing is water  resistant but do not scrub it as it may start to peel up.   - You may remove the sling for showering - Gently pat the area dry.  - Do not soak the shoulder in water .  - Do not go swimming in the pool or ocean until your incision has completely healed  - KEEP THE INCISIONS CLEAN AND DRY.  EXERCISES Wear the sling at all times  You may remove the sling for showering, but keep the arm across the chest or in a secondary sling.    It is ok to come out of your sling if your are sitting and have assistance for eating.   Do not lift anything heavier than 1 pound until we discuss it further in clinic. It is normal for your fingers/hand to become more swollen after surgery and discolored from bruising.   This will resolve over the first few weeks usually after surgery. Please continue to ambulate and do not stay sitting or lying for too long.  Perform foot and wrist pumps to assist in circulation.  REGIONAL ANESTHESIA (NERVE BLOCKS) The anesthesia team may have performed a nerve block for you this is a great tool used to minimize pain.   The block may start wearing off overnight (between 8-24 hours postop) When the block wears off, your pain may go from nearly zero to the pain you would have had postop without the  block. This is an abrupt transition but nothing dangerous is happening.   This can be a challenging period but utilize your as needed pain medications to try and manage this period. We suggest you use the pain medication the first night prior to going to bed, to ease this transition.  You may take an extra dose of narcotic when this happens if needed  FOLLOW-UP If you develop a Fever (>101.5), Redness or Drainage from the surgical incision site, please call our office to arrange for an evaluation. Please call the office to schedule a follow-up appointment for a wound check, 7-10 days post-operatively.  IF YOU HAVE ANY QUESTIONS, PLEASE FEEL FREE TO CALL OUR OFFICE.  HELPFUL INFORMATION  Your arm will be in a sling following surgery.   You may be more comfortable sleeping in a semi-seated position the first few nights following surgery.  Keep a pillow propped under the elbow and forearm for comfort.  If you have a recliner type of chair it might be beneficial.  If not that is fine too, but it would be helpful to sleep propped up with pillows behind your operated shoulder as well under your elbow and forearm.  This will reduce pulling on the suture lines.  When dressing, put your operative arm in the sleeve first.  When getting  undressed, take your operative arm out last.  Loose fitting, button-down shirts are recommended.  In most states it is against the law to drive while your arm is in a sling. And certainly against the law to drive while taking narcotics.  You may return to work/school in the next couple of days when you feel up to it. Desk work and typing in the sling is fine.  We suggest you use the pain medication the first night prior to going to bed, in order to ease any pain when the anesthesia wears off. You should avoid taking pain medications on an empty stomach as it will make you nauseous.  You should wean off your narcotic medicines as soon as you are able.     Most patients  will be off narcotics before their first postop appointment.   Do not drink alcoholic beverages or take illicit drugs when taking pain medications.  Pain medication may make you constipated.  Below are a few solutions to try in this order: Decrease the amount of pain medication if you aren't having pain. Drink lots of decaffeinated fluids. Drink prune juice and/or each dried prunes  If the first 3 don't work start with additional solutions Take Colace - an over-the-counter stool softener Take Senokot - an over-the-counter laxative Take Miralax  - a stronger over-the-counter laxative

## 2023-11-04 NOTE — Anesthesia Postprocedure Evaluation (Signed)
 Anesthesia Post Note  Patient: Sharon Welch  Procedure(s) Performed: ARTHROPLASTY, SHOULDER, TOTAL (Left: Shoulder)     Patient location during evaluation: PACU Anesthesia Type: Regional and General Level of consciousness: awake and alert Pain management: pain level controlled Vital Signs Assessment: post-procedure vital signs reviewed and stable Respiratory status: spontaneous breathing, nonlabored ventilation, respiratory function stable and patient connected to nasal cannula oxygen  Cardiovascular status: blood pressure returned to baseline and stable Postop Assessment: no apparent nausea or vomiting Anesthetic complications: no  No notable events documented.  Last Vitals:  Vitals:   11/04/23 1445 11/04/23 1532  BP: 103/66 127/73  Pulse: 79 80  Resp: 18 18  Temp:  36.6 C  SpO2: 96% 100%    Last Pain:  Vitals:   11/04/23 1532  TempSrc: Oral  PainSc:                  [name]

## 2023-11-05 ENCOUNTER — Encounter (HOSPITAL_COMMUNITY): Payer: Self-pay | Admitting: Orthopedic Surgery

## 2023-11-05 DIAGNOSIS — M75102 Unspecified rotator cuff tear or rupture of left shoulder, not specified as traumatic: Secondary | ICD-10-CM | POA: Diagnosis not present

## 2023-11-05 LAB — BASIC METABOLIC PANEL WITH GFR
Anion gap: 9 (ref 5–15)
BUN: 8 mg/dL (ref 6–20)
CO2: 25 mmol/L (ref 22–32)
Calcium: 8.4 mg/dL — ABNORMAL LOW (ref 8.9–10.3)
Chloride: 105 mmol/L (ref 98–111)
Creatinine, Ser: 0.7 mg/dL (ref 0.44–1.00)
GFR, Estimated: >60 mL/min (ref >60)
Glucose, Bld: 112 mg/dL — ABNORMAL HIGH (ref 70–99)
Potassium: 3.3 mmol/L — ABNORMAL LOW (ref 3.5–5.1)
Sodium: 139 mmol/L (ref 135–145)

## 2023-11-05 LAB — CBC
HCT: 36.3 % (ref 36.0–46.0)
Hemoglobin: 12.5 g/dL (ref 12.0–15.0)
MCH: 31.6 pg (ref 26.0–34.0)
MCHC: 34.4 g/dL (ref 30.0–36.0)
MCV: 91.7 fL (ref 80.0–100.0)
Platelets: 241 10*3/uL (ref 150–400)
RBC: 3.96 MIL/uL (ref 3.87–5.11)
RDW: 11.9 % (ref 11.5–15.5)
WBC: 12.7 10*3/uL — ABNORMAL HIGH (ref 4.0–10.5)
nRBC: 0 % (ref 0.0–0.2)

## 2023-11-05 MED ORDER — OXYCODONE HCL 5 MG PO TABS: 5.0000 mg | ORAL_TABLET | ORAL | 0 refills | Status: DC | PRN

## 2023-11-05 NOTE — Progress Notes (Signed)
     Subjective: 1 Day Post-Op s/p Procedure(s): ARTHROPLASTY, SHOULDER, TOTAL   Patient is alert, oriented. Patient reports pain as so far well controlled, block has not yet worn off. Denies chest pain, SOB, Calf pain. No nausea/vomiting. No other complaints.    Objective:  PE: VITALS:   Vitals:   11/04/23 1750 11/04/23 2046 11/05/23 0106 11/05/23 0442  BP: 138/78 121/69 112/72 129/72  Pulse: 79 75 86 99  Resp: 16 18 18 18   Temp:  97.6 F (36.4 C) 98 F (36.7 C) 97.7 F (36.5 C)  TempSrc:  Oral Oral Oral  SpO2: 100% 100% 97% 100%  Weight:      Height:       General: sitting up in bed in no acute distress Resp: normal respiratory effort, dry cough Arm in sling Sensation intact distally Intact pulses distally Incision: dressing C/D/I  LABS  Results for orders placed or performed during the hospital encounter of 11/04/23 (from the past 24 hours)  CBC     Status: Abnormal   Collection Time: 11/05/23  3:30 AM  Result Value Ref Range   WBC 12.7 (H) 4.0 - 10.5 K/uL   RBC 3.96 3.87 - 5.11 MIL/uL   Hemoglobin 12.5 12.0 - 15.0 g/dL   HCT 09.8 11.9 - 14.7 %   MCV 91.7 80.0 - 100.0 fL   MCH 31.6 26.0 - 34.0 pg   MCHC 34.4 30.0 - 36.0 g/dL   RDW 82.9 56.2 - 13.0 %   Platelets 241 150 - 400 K/uL   nRBC 0.0 0.0 - 0.2 %  Basic metabolic panel     Status: Abnormal   Collection Time: 11/05/23  3:30 AM  Result Value Ref Range   Sodium 139 135 - 145 mmol/L   Potassium 3.3 (L) 3.5 - 5.1 mmol/L   Chloride 105 98 - 111 mmol/L   CO2 25 22 - 32 mmol/L   Glucose, Bld 112 (H) 70 - 99 mg/dL   BUN 8 6 - 20 mg/dL   Creatinine, Ser 8.65 0.44 - 1.00 mg/dL   Calcium  8.4 (L) 8.9 - 10.3 mg/dL   GFR, Estimated >78 >46 mL/min   Anion gap 9 5 - 15    DG Shoulder Left Port Result Date: 11/04/2023 CLINICAL DATA:  Status post reverse total left shoulder arthroplasty. EXAM: LEFT SHOULDER COMPARISON:  Left humerus radiographs 05/11/2023, CT left shoulder 07/30/2023 FINDINGS: Interval  reverse total left shoulder arthroplasty. No perihardware lucency is seen to indicate hardware failure or loosening. Expected postoperative subacromial/subdeltoid and lateral left shoulder subcutaneous air. No acute fracture or dislocation. Partial visualization of mid and lower thoracic spine posterior rod fusion hardware. IMPRESSION: Interval reverse total left shoulder arthroplasty without evidence of hardware failure. An Electronically Signed   By: Bertina Broccoli M.D.   On: 11/04/2023 16:06    Assessment/Plan: Principal Problem:   S/P reverse total shoulder arthroplasty, left   1 Day Post-Op s/p Procedure(s): ARTHROPLASTY, SHOULDER, TOTAL  Weightbearing: NWB LUE Insicional and dressing care: Reinforce dressings as needed VTE prophylaxis: SCDs, ambulation Pain control: continue oxycodone  Follow - up plan: 2 weeks with Agatha Horsfall Dispo: discharge home today after OT   Contact information:   Hurshel Maidens, PA-C Weekdays 8-5  After hours and holidays please check [email or web address] for group call information for [facility]  Abraham Hoffmann 11/05/2023, 8:31 AM

## 2023-11-05 NOTE — Evaluation (Signed)
 Occupational Therapy Evaluation Patient Details Name: Sharon Welch MRN: [id number] DOB: 10/01/1970 Today's Date: 11/05/2023   History of Present Illness   Patient is a 53 year old female who presented on 5/13 with Massive irreparable left subscapularis tear with atrophy. Patient underwent left reverse total shoulder arthroplasty. PMH: OA, cervical cancer, DDD, dysrhythmia, fibromyalgia, scoliosis, seizures, L THA,     Clinical Impressions s/p shoulder replacement without functional use of LUE secondary to effects of surgery and interscalene block and shoulder precautions. Therapist provided education and instruction to patient  in regards to exercises, precautions, positioning, donning upper extremity clothing and bathing while maintaining shoulder precautions, ice and edema management, use of ice machine and donning/doffing sling. Patient verbalized understanding and demonstrated as needed. Patient needed assistance to donn shirt, underwear, pants, socks and shoes and provided with instruction on compensatory strategies to perform ADLs. Patient to follow up with MD for further therapy needs.       If plan is discharge home, recommend the following:   Assistance with cooking/housework;Direct supervision/assist for medications management;Assist for transportation;Help with stairs or ramp for entrance;A little help with bathing/dressing/bathroom     Functional Status Assessment   Patient has had a recent decline in their functional status and demonstrates the ability to make significant improvements in function in a reasonable and predictable amount of time.     Equipment Recommendations   None recommended by OT      Precautions/Restrictions   Precautions Precautions: Shoulder Type of Shoulder Precautions: no ROM shoulder, ok for hand wrist and elbow. Shoulder Interventions: Shoulder sling/immobilizer;Off for dressing/bathing/exercises;At all times Precaution Booklet  Issued: Yes (comment) (handouts) Recall of Precautions/Restrictions: Intact Restrictions Weight Bearing Restrictions Per Provider Order: Yes LUE Weight Bearing Per Provider Order: Non weight bearing     Mobility Bed Mobility Overal bed mobility: Needs Assistance Bed Mobility: Supine to Sit     Supine to sit: Modified independent (Device/Increase time)                Balance Overall balance assessment: No apparent balance deficits (not formally assessed)         ADL either performed or assessed with clinical judgement   ADL             General ADL Comments: patient was able to complete dressing tasks with supervision in room after education on proper sequencing for shoulder precautions. patient demonstrated understanding. patient reported having husband and daughter (who is nurse) support at home if she needed anything.     Vision Patient Visual Report: No change from baseline              Pertinent Vitals/Pain Pain Assessment Pain Assessment: No/denies pain (block still in place.)     Extremity/Trunk Assessment Upper Extremity Assessment Upper Extremity Assessment: LUE deficits/detail LUE Deficits / Details: shoulder surgery with no ROM per recommendations at shoulder level. able to wiggle digits, not able to control wrist or forearm movements yet.           Communication     Cognition Arousal: Alert Behavior During Therapy: WFL for tasks assessed/performed Cognition: No apparent impairments             OT - Cognition Comments: plesant and cooperative.                 Following commands: Intact             Shoulder Instructions Shoulder Instructions Donning/doffing shirt without moving shoulder: Patient able to independently direct  caregiver Method for sponge bathing under operated UE: Patient able to independently direct caregiver Donning/doffing sling/immobilizer: Modified independent Correct positioning of sling/immobilizer:  Modified independent ROM for elbow, wrist and digits of operated UE: Modified independent Sling wearing schedule (on at all times/off for ADL's): Modified independent Proper positioning of operated UE when showering: Modified independent Positioning of UE while sleeping: Modified independent;Patient able to independently direct caregiver    Home Living Family/patient expects to be discharged to:: Private residence Living Arrangements: Spouse/significant other Available Help at Discharge: Family;Available 24 hours/day Type of Home: House Home Access: Stairs to enter     Home Layout: One level     Bathroom Shower/Tub: Tub/shower unit                    Prior Functioning/Environment Prior Level of Function : Independent/Modified Independent                    OT Problem List: Decreased knowledge of precautions;Decreased safety awareness   OT Treatment/Interventions: Self-care/ADL training;Therapeutic activities;Patient/family education      OT Goals(Current goals can be found in the care plan section)   Acute Rehab OT Goals Patient Stated Goal: to get home OT Goal Formulation: All assessment and education complete, DC therapy   OT Frequency:  7X/week       AM-PAC OT "6 Clicks" Daily Activity     Outcome Measure Help from another person eating meals?: None Help from another person taking care of personal grooming?: A Little Help from another person toileting, which includes using toliet, bedpan, or urinal?: A Little Help from another person bathing (including washing, rinsing, drying)?: A Little Help from another person to put on and taking off regular upper body clothing?: A Little Help from another person to put on and taking off regular lower body clothing?: A Little 6 Click Score: 19   End of Session Equipment Utilized During Treatment: Other (comment) (sling) Nurse Communication: Other (comment) (good from OT standpoint)  Activity Tolerance:  Patient tolerated treatment well Patient left: in bed;with call bell/phone within reach  OT Visit Diagnosis: Unsteadiness on feet (R26.81);Other abnormalities of gait and mobility (R26.89)                Time: 1914-7829 OT Time Calculation (min): 27 min Charges:  OT General Charges $OT Visit: 1 Visit OT Evaluation $OT Eval Low Complexity: 1 Low OT Treatments $Self Care/Home Management : 8-22 mins  Wynette Heckler, MS Acute Rehabilitation Department Office# [phone]   Jame Maze 11/05/2023, 8:16 AM

## 2023-11-05 NOTE — Discharge Summary (Signed)
 Discharge Summary  Patient ID: Sharon Welch MRN: [id number] DOB/AGE: 1970-10-07 53 y.o.  Admit date: 11/04/2023 Discharge date: 11/05/2023  Admission Diagnoses:  S/P reverse total shoulder arthroplasty, left  Discharge Diagnoses:  Principal Problem:   S/P reverse total shoulder arthroplasty, left   Past Medical History:  Diagnosis Date   Asthma    seasonal   Bruising, spontaneous 12/08/2013   Cancer (HCC)    cervical cancer - had hysterectomy   Chronic back pain    Chronic back pain    DDD (degenerative disc disease), lumbar    DDD (degenerative disc disease), lumbar    Dysrhythmia    tachycardia   Fibromyalgia    Headache(784.0)    migraines   History of kidney stones    Ovarian cyst    Partial tear subscapularis tendon 12/27/2011   Scoliosis    Seizures (HCC)    started 9/12-    Surgeries: Procedure(s): ARTHROPLASTY, SHOULDER, TOTAL on 11/04/2023   Consultants (if any):   Discharged Condition: Improved  Hospital Course: Sharon Welch is an 53 y.o. female who was admitted 11/04/2023 with a diagnosis of S/P reverse total shoulder arthroplasty, left and went to the operating room on 11/04/2023 and underwent the above named procedures.    She was given perioperative antibiotics:  Anti-infectives (From admission, onward)    Start     Dose/Rate Route Frequency Ordered Stop   11/04/23 1800  ceFAZolin  (ANCEF ) IVPB 2g/100 mL premix        2 g 200 mL/hr over 30 Minutes Intravenous Every 6 hours 11/04/23 1531 11/05/23 0015   11/04/23 0945  ceFAZolin  (ANCEF ) IVPB 2g/100 mL premix        2 g 200 mL/hr over 30 Minutes Intravenous On call to O.R. 11/04/23 4098 11/04/23 1208     .  She was given sequential compression devices, early ambulation for DVT prophylaxis.  She benefited maximally from the hospital stay and there were no complications.    Recent vital signs:  Vitals:   11/05/23 0106 11/05/23 0442  BP: 112/72 129/72  Pulse: 86 99  Resp: 18 18   Temp: 98 F (36.7 C) 97.7 F (36.5 C)  SpO2: 97% 100%    Recent laboratory studies:  Lab Results  Component Value Date   HGB 12.5 11/05/2023   HGB 14.3 10/23/2023   HGB 16.7 (H) 08/02/2023   Lab Results  Component Value Date   WBC 12.7 (H) 11/05/2023   PLT 241 11/05/2023   Lab Results  Component Value Date   INR 1.1 07/18/2022   Lab Results  Component Value Date   NA 139 11/05/2023   K 3.3 (L) 11/05/2023   CL 105 11/05/2023   CO2 25 11/05/2023   BUN 8 11/05/2023   CREATININE 0.70 11/05/2023   GLUCOSE 112 (H) 11/05/2023    Discharge Medications:   Allergies as of 11/05/2023       Reactions   Ultram [tramadol] Other (See Comments)   Possible seizures   Aleve [naproxen] Other (See Comments)   Ulcers   Blueberry [vaccinium Angustifolium] Swelling   Coconut (cocos Nucifera) Swelling   Depakote  [divalproex  Sodium] Other (See Comments)   Insomnia . (Changed from divalproex  to Lamictal  due to side effects)   Nsaids Other (See Comments)   History of bleeding ulcers.   Pineapple Other (See Comments)   Migraine        Medication List     STOP taking these medications    aspirin  EC 81  MG tablet   HYDROcodone -acetaminophen  10-325 MG tablet Commonly known as: NORCO       TAKE these medications    albuterol  108 (90 Base) MCG/ACT inhaler Commonly known as: VENTOLIN  HFA Inhale 1-2 puffs into the lungs every 4 (four) hours as needed for wheezing or shortness of breath.   ALPRAZolam  0.25 MG tablet Commonly known as: XANAX  Take 0.25-0.5 mg by mouth 4 (four) times daily as needed for anxiety or sleep.   amitriptyline  25 MG tablet Commonly known as: ELAVIL  Take 50 mg by mouth at bedtime.   Compression Bandages Kit 1 each by Does not apply route daily.   dexlansoprazole 60 MG capsule Commonly known as: DEXILANT Take 60 mg by mouth daily.   diltiazem  180 MG 24 hr capsule Commonly known as: CARDIZEM  CD TAKE ONE CAPSULE BY MOUTH EVERY DAY   folic acid   1 MG tablet Commonly known as: FOLVITE  Take 1 mg by mouth daily.   furosemide  20 MG tablet Commonly known as: LASIX  Take 1 tablet (20 mg total) by mouth daily.   lamoTRIgine  150 MG tablet Commonly known as: LaMICtal  Take 1 tablet (150 mg total) by mouth 2 (two) times daily.   levETIRAcetam  500 MG tablet Commonly known as: Keppra  Take 1 tablet (500 mg total) by mouth 2 (two) times daily.   Linzess  145 MCG Caps capsule Generic drug: linaclotide  Take 145 mcg by mouth daily before breakfast.   meclizine  25 MG tablet Commonly known as: ANTIVERT  Take 1 tablet (25 mg total) by mouth 3 (three) times daily.   methocarbamol  500 MG tablet Commonly known as: ROBAXIN  Take 500 mg by mouth 3 (three) times daily.   oxyCODONE  5 MG immediate release tablet Commonly known as: Roxicodone  Take 1 tablet (5 mg total) by mouth every 4 (four) hours as needed for breakthrough pain.   promethazine  25 MG tablet Commonly known as: PHENERGAN  Take 25 mg by mouth every 6 (six) hours as needed for nausea or vomiting.   Qulipta  60 MG Tabs Generic drug: Atogepant  Take 1 tablet (60 mg total) by mouth daily.   rizatriptan 10 MG tablet Commonly known as: MAXALT Take 10 mg by mouth See admin instructions. 10 mg as needed for migraine, may repeat 10 mg in 2 hours if headache persists or recurs.   Savella  50 MG Tabs tablet Generic drug: Milnacipran  Take 50 mg by mouth 2 (two) times daily.        Diagnostic Studies: DG Shoulder Left Port Result Date: 11/04/2023 CLINICAL DATA:  Status post reverse total left shoulder arthroplasty. EXAM: LEFT SHOULDER COMPARISON:  Left humerus radiographs 05/11/2023, CT left shoulder 07/30/2023 FINDINGS: Interval reverse total left shoulder arthroplasty. No perihardware lucency is seen to indicate hardware failure or loosening. Expected postoperative subacromial/subdeltoid and lateral left shoulder subcutaneous air. No acute fracture or dislocation. Partial visualization of  mid and lower thoracic spine posterior rod fusion hardware. IMPRESSION: Interval reverse total left shoulder arthroplasty without evidence of hardware failure. An Electronically Signed   By: Bertina Broccoli M.D.   On: 11/04/2023 16:06    Disposition: Discharge disposition: 01-Home or Self Care          Follow-up Information     Osa Blase, MD. Schedule an appointment as soon as possible for a visit in 2 week(s).   Specialty: Orthopedic Surgery Contact information: [address] [place] [address] [phone]                  Signed: Terrence Ferron  Bevin Bucks PA-C 11/05/2023, 3:32 PM

## 2023-11-12 ENCOUNTER — Encounter (HOSPITAL_COMMUNITY): Payer: Self-pay | Admitting: Occupational Therapy

## 2023-11-12 ENCOUNTER — Other Ambulatory Visit: Payer: Self-pay

## 2023-11-12 ENCOUNTER — Ambulatory Visit (HOSPITAL_COMMUNITY): Attending: Orthopedic Surgery | Admitting: Occupational Therapy

## 2023-11-12 DIAGNOSIS — M25512 Pain in left shoulder: Secondary | ICD-10-CM | POA: Diagnosis present

## 2023-11-12 DIAGNOSIS — M25612 Stiffness of left shoulder, not elsewhere classified: Secondary | ICD-10-CM | POA: Diagnosis present

## 2023-11-12 DIAGNOSIS — R29898 Other symptoms and signs involving the musculoskeletal system: Secondary | ICD-10-CM | POA: Diagnosis present

## 2023-11-12 NOTE — Patient Instructions (Signed)
 TOWEL SLIDES COMPLETE FOR 1-3 MINUTES, 3-5 TIMES PER DAY  1) SHOULDER: Flexion On Table   Place hands on table, elbows straight. Move hips away from body. Press hands down into table. 10 reps per set, 3x per day  2) Abduction (Passive)   With arm out to side, resting on table, lower head toward arm, keeping trunk away from table.  Repeat 10 times. Do 3 sessions per day.  Copyright  VHI. All rights reserved.     3) Internal Rotation (Assistive)   Seated with elbow bent at right angle and held against side, slide arm on table surface in an inward arc. Repeat 10 times. Do 3 sessions per day. Activity: Use this motion to brush crumbs off the table.  Copyright  VHI. All rights reserved.    COMPLETE PENDULUM EXERCISES FOR 30 SECONDS TO 1 MINUTE EACH, 3-5 TIMES PER DAY. 4) ROM: Pendulum (Side-to-Side)   Bend forward 90 at waist, using table for support. Rock body side to side to swing arm.Complete for 1 minute.  Do 3 sessions per day.    http://orth.exer.us /792   Copyright  VHI. All rights reserved.  5) Pendulum Forward/Back   Bend forward 90 at waist, using table for support. Rock body forward and back to swing arm. Complete for 1 minute.  Do 3 sessions per day.  Copyright  VHI. All rights reserved.  6) Pendulum Circular   Bend forward 90 at waist, leaning on table for support. Rock body in a circular pattern to move arm clockwise then reverse and complete counterclockwise. Complete for 1 minute.  Do 3 sessions per day.    Copyright  VHI. All rights reserved.  7) AROM: Wrist Extension   With right palm down, bend wrist up. Repeat 10____ times per set. Do ____ sets per session. Do __3__ sessions per day.  Copyright  VHI. All rights reserved.   8) AROM: Wrist Flexion   With right palm up, bend wrist up. Repeat ___10_ times per set. Do ____ sets per session. Do __3__ sessions per day.  Copyright  VHI. All rights reserved.   9) AROM: Forearm  Pronation / Supination   With right arm in handshake position, slowly rotate palm down until stretch is felt. Relax. Then rotate palm up until stretch is felt. Repeat __10__ times per set. Do ____ sets per session. Do __3__ sessions per day.  Copyright  VHI. All rights reserved.   10) Flexion (Passive)   Use other hand to bend elbow, with thumb toward same shoulder. Do NOT force this motion.  Repeat 10 times. Do 3 sessions per day.

## 2023-11-12 NOTE — Therapy (Signed)
 OUTPATIENT OCCUPATIONAL THERAPY ORTHO EVALUATION  [name] MRN: [id number] DOB:Dec 13, 1970, 53 y.o., female Today's Date: 11/12/2023   END OF SESSION:  OT End of Session - 11/12/23 1009     Visit Number 1    Number of Visits 16    Date for OT Re-Evaluation 01/11/24   progress note 12/12/23   Authorization Type 1) UHC Dual Complete Medicare 2) [place] Medicaid    Authorization Time Period no auth required    Progress Note Due on Visit 10    OT Start Time 0935    OT Stop Time 1001    OT Time Calculation (min) 26 min    Activity Tolerance Patient tolerated treatment well    Behavior During Therapy WFL for tasks assessed/performed             Past Medical History:  Diagnosis Date   Asthma    seasonal   Bruising, spontaneous 12/08/2013   Cancer (HCC)    cervical cancer - had hysterectomy   Chronic back pain    Chronic back pain    DDD (degenerative disc disease), lumbar    DDD (degenerative disc disease), lumbar    Dysrhythmia    tachycardia   Fibromyalgia    Headache(784.0)    migraines   History of kidney stones    Ovarian cyst    Partial tear subscapularis tendon 12/27/2011   Scoliosis    Seizures (HCC)    started 9/12-   Past Surgical History:  Procedure Laterality Date   ABDOMINAL HYSTERECTOMY  2012   BACK SURGERY  1989   scoliosis throsic-rods   CESAREAN SECTION     CHOLECYSTECTOMY N/A 11/17/2012   Procedure: LAPAROSCOPIC CHOLECYSTECTOMY WITH INTRAOPERATIVE CHOLANGIOGRAM;  Surgeon: Shela Derby, MD;  Location: [facility];  Service: General;  Laterality: N/A;   COLONOSCOPY WITH PROPOFOL  N/A 04/21/2020   Procedure: COLONOSCOPY WITH PROPOFOL ;  Surgeon: Alvis Jourdain, MD;  Location: [facility];  Service: Endoscopy;  Laterality: N/A;   ENDOSCOPIC RETROGRADE CHOLANGIOPANCREATOGRAPHY (ERCP) WITH PROPOFOL  N/A 06/11/2019   Procedure: ENDOSCOPIC RETROGRADE CHOLANGIOPANCREATOGRAPHY (ERCP) WITH PROPOFOL ;  Surgeon: Alvis Jourdain, MD;  [facility]: [facility];  Service: Endoscopy;  Laterality: N/A;   ERCP N/A 05/07/2013   Procedure: ENDOSCOPIC RETROGRADE CHOLANGIOPANCREATOGRAPHY (ERCP);  Surgeon: Almeda Aris, MD;  Location: [facility];  Service: Endoscopy;  Laterality: N/A;  start ercp first, if canulation fails switch to EUS    ESOPHAGOGASTRODUODENOSCOPY (EGD) WITH PROPOFOL  N/A 05/15/2019   Procedure: ESOPHAGOGASTRODUODENOSCOPY (EGD) WITH PROPOFOL ;  Surgeon: Alyce Jubilee, MD;  Location: [facility];  Service: Endoscopy;  Laterality: N/A;   ESOPHAGOGASTRODUODENOSCOPY (EGD) WITH PROPOFOL  N/A 03/03/2020   Procedure: ESOPHAGOGASTRODUODENOSCOPY (EGD) WITH PROPOFOL ;  Surgeon: Alvis Jourdain, MD;  [facility]: [facility];  Service: Endoscopy;  Laterality: N/A;   ESOPHAGOGASTRODUODENOSCOPY (EGD) WITH PROPOFOL  N/A 04/21/2020   Procedure: ESOPHAGOGASTRODUODENOSCOPY (EGD) WITH PROPOFOL ;  Surgeon: Alvis Jourdain, MD;  [facility]: [facility];  Service: Endoscopy;  Laterality: N/A;   EUS N/A 11/03/2012   Procedure: UPPER ENDOSCOPIC ULTRASOUND (EUS) LINEAR;  Surgeon: Almeda Aris, MD;  Location: [facility];  Service: Endoscopy;  Laterality: N/A;   left arm     NECK SURGERY     POLYPECTOMY  04/21/2020   Procedure: POLYPECTOMY;  Surgeon: Alvis Jourdain, MD;  [facility];  Service: Endoscopy;;   REMOVAL OF STONES  06/11/2019   Procedure: REMOVAL OF STONES;  Surgeon: Alvis Jourdain, MD;  [facility]: [facility];  Service: Endoscopy;;   TOOTH EXTRACTION N/A 05/24/2022  Procedure: DENTAL RESTORATION/EXTRACTIONS;  Surgeon: Ascencion Lava, DMD;  Location: [facility];  Service: Oral Surgery;  Laterality: N/A;   TOTAL HIP ARTHROPLASTY Left 11/17/2022   Procedure: TOTAL HIP ARTHROPLASTY ANTERIOR APPROACH;  Surgeon: Saundra Curl, MD;  Location: [facility];  Service: Orthopedics;  Laterality: Left;   TOTAL SHOULDER ARTHROPLASTY Left 11/04/2023   Procedure: ARTHROPLASTY, SHOULDER, TOTAL;  Surgeon: Osa Blase, MD;  Location: [facility];  Service: Orthopedics;   Laterality: Left;   TUBAL LIGATION     UPPER ESOPHAGEAL ENDOSCOPIC ULTRASOUND (EUS) N/A 06/11/2019   Procedure: UPPER ESOPHAGEAL ENDOSCOPIC ULTRASOUND (EUS);  Surgeon: Alvis Jourdain, MD;  Location: [facility];  Service: Endoscopy;  Laterality: N/A;   Patient Active Problem List   Diagnosis Date Noted   S/P reverse total shoulder arthroplasty, left 11/04/2023   Vertigo 05/28/2023   Palpitations 12/05/2022   Transaminitis 11/16/2022   Hip fracture (HCC) 11/15/2022   GERD (gastroesophageal reflux disease) 11/15/2022   Tobacco use 11/15/2022   Idiopathic hypotension 07/19/2022   Slurred speech 07/19/2022   Underweight 07/19/2022   Hypotension 07/19/2022   Seizure disorder (HCC) 07/18/2022   Chronic migraine w/o aura w/o status migrainosus, not intractable 07/18/2022   Cerebral cavernoma 07/18/2022   Acute GI bleed due to bleeding pyloric/gastric ulcer 05/15/2019   PUD (peptic ulcer disease)-EGD 05/15/2019 with bleeding pyloric/gastric ulcer, nonbleeding duodenal ulcers and gastritis    Hematemesis with nausea    Migraine headache 09/25/2015   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   Bilateral occipital neuralgia 01/02/2015   Migraine 01/02/2015   Chest pain of uncertain etiology 11/29/2014   DDD (degenerative disc disease), cervical 10/27/2014   DDD (degenerative disc disease), 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   DDD (degenerative disc disease), lumbosacral 10/27/2014   Hypokalemia 05/07/2013   Partial tear subscapularis tendon 12/27/2011    PCP: Dr. Kathyleen Parkins REFERRING PROVIDER: Dr. Osa Blase  ONSET DATE: 11/04/23  REFERRING DIAG: s/p left reverse TSA  THERAPY DIAG:  Acute pain of left shoulder  Stiffness of left shoulder, not elsewhere classified  Other symptoms and signs involving the musculoskeletal system  Rationale for Evaluation and Treatment: Rehabilitation  SUBJECTIVE:   SUBJECTIVE STATEMENT: S: "I'm a little nervous" Pt accompanied by:  self  PERTINENT HISTORY: Pt is a 53 y/o female s/p left reverse TSA on 11/04/23. Pt presents in standard sling, reports   PRECAUTIONS: Shoulder-see protocol  WEIGHT BEARING RESTRICTIONS: Yes NWB  PAIN:  Are you having pain? Yes: NPRS scale: 4/10 Pain location: left shoulder Pain description: constant ache, some sharp pains Aggravating factors: movement Relieving factors: ice  FALLS: Has patient fallen in last 6 months? No  PLOF: Independent  PATIENT GOALS: To have less pain and more mobility.  NEXT MD VISIT: 11/19/23  OBJECTIVE:   HAND DOMINANCE: Right  ADLs: Overall ADLs: Pt is unable to use the LUE for any ADLs at this time. Pt reports difficulty with sleeping, waking up a lot, sleeping in the recliner, tried the bed last night with a little better sleep. Pt is unable to reach overhead or behind back, cannot lift items.   FUNCTIONAL OUTCOME MEASURES: Upper Extremity Functional Scale (UEFS): 8/80=10%  UPPER EXTREMITY ROM:       Assessed in supine, er/IR adducted  Passive ROM Left eval  Shoulder flexion 61  Shoulder abduction 62  Shoulder internal rotation 90  Shoulder external rotation 0  (Blank rows = not tested)    UPPER EXTREMITY MMT:     Not assessed due to protocol & pain  MMT Left eval  Shoulder flexion   Shoulder abduction   Shoulder internal rotation   Shoulder external rotation   (Blank rows = not tested)   SENSATION: Pt reports intermittent numbness in the elbow  EDEMA: None  COGNITION: Overall cognitive status: Within functional limits for tasks assessed  OBSERVATIONS: pt with mod fascial restrictions along upper arm, anterior shoulder, trapezius and scapular regions   TODAY'S TREATMENT:                                                                                                                              DATE:     PATIENT EDUCATION: Education details: pendulums, table slides (no abduction) wrist A/ROM, elbow self-ROM Person  educated: Patient Education method: Programmer, multimedia, Demonstration, and Handouts Education comprehension: verbalized understanding and returned demonstration  HOME EXERCISE PROGRAM: Eval: pendulums, table slides (no abduction) wrist A/ROM, elbow self-ROM  GOALS: Goals reviewed with patient? Yes   SHORT TERM GOALS: Target date: 12/12/23  Pt will be provided with and educated on HEP to improve mobility in LUE required for use during ADL completion.   Goal status: INITIAL  2.  Pt will increase LUE P/ROM by 50+ degrees to improve ability to use LUE during dressing tasks with minimal compensatory techniques.   Goal status: INITIAL  3.  Pt will increase LUE strength to 3+/5 to improve ability to reach for items at waist to chest height during bathing and grooming tasks.   Goal status: INITIAL   LONG TERM GOALS: Target date: 01/11/24  Pt will decrease pain in LUE to 3/10 or less to improve ability to sleep for 2+ consecutive hours without waking due to pain.   Goal status: INITIAL  2.  Pt will decrease LUE fascial restrictions to min amounts or less to improve mobility required for functional reaching tasks.   Goal status: INITIAL  3.  Pt will increase LUE A/ROM by 75+ degrees to improve ability to use LUE when reaching overhead or behind back during dressing and bathing tasks.   Goal status: INITIAL  4.  Pt will increase LUE strength to 4+/5 or greater to improve ability to use LUE when lifting or carrying items during meal preparation/housework/yardwork tasks.   Goal status: INITIAL  5.  Pt will return to highest level of function using LUE as non-dominant during functional task completion.   Goal status: INITIAL   ASSESSMENT:  CLINICAL IMPRESSION: Patient is a 53 y.o. female who was seen today for occupational therapy evaluation s/p left reverse TSA on 11/04/23. Pt presents with increased pain and fascial restrictions, decreased ROM, strength, and functional use of the LUE.  Pt with max guarding noted during evaluation, verbal cuing to relax LUE for measuring.   PERFORMANCE DEFICITS: in functional skills including in functional skills including ADLs, IADLs, coordination, tone, ROM, strength, pain, fascial restrictions, muscle spasms, and UE functional use  IMPAIRMENTS: are limiting patient from ADLs, IADLs, rest and sleep, work, and leisure.  COMORBIDITIES: has no other co-morbidities that affects occupational performance. Patient will benefit from skilled OT to address above impairments and improve overall function.  MODIFICATION OR ASSISTANCE TO COMPLETE EVALUATION: No modification of tasks or assist necessary to complete an evaluation.  OT OCCUPATIONAL PROFILE AND HISTORY: Problem focused assessment: Including review of records relating to presenting problem.  CLINICAL DECISION MAKING: LOW - limited treatment options, no task modification necessary  REHAB POTENTIAL: Good  EVALUATION COMPLEXITY: Low      PLAN:  OT FREQUENCY: 2x/week  OT DURATION: 8 weeks  PLANNED INTERVENTIONS: 97168 OT Re-evaluation, 97535 self care/ADL training, 16109 therapeutic exercise, 97530 therapeutic activity, 97112 neuromuscular re-education, 97140 manual therapy, 97014 electrical stimulation unattended, patient/family education, and DME and/or AE instructions  RECOMMENDED OTHER SERVICES: None at this time  CONSULTED AND AGREED WITH PLAN OF CARE: Patient  PLAN FOR NEXT SESSION: Follow up on HEP, initiate manual techniques, scapular A/ROM, P/ROM   Lafonda Piety, OTR/L  [phone] 11/12/2023, 10:10 AM

## 2023-11-14 ENCOUNTER — Encounter (HOSPITAL_COMMUNITY): Payer: Self-pay | Admitting: Occupational Therapy

## 2023-11-14 ENCOUNTER — Ambulatory Visit (HOSPITAL_COMMUNITY): Admitting: Occupational Therapy

## 2023-11-14 DIAGNOSIS — M25612 Stiffness of left shoulder, not elsewhere classified: Secondary | ICD-10-CM

## 2023-11-14 DIAGNOSIS — R29898 Other symptoms and signs involving the musculoskeletal system: Secondary | ICD-10-CM

## 2023-11-14 DIAGNOSIS — M25512 Pain in left shoulder: Secondary | ICD-10-CM | POA: Diagnosis not present

## 2023-11-14 NOTE — Therapy (Signed)
 OUTPATIENT OCCUPATIONAL THERAPY ORTHO EVALUATION  [name] MRN: [id number] DOB:19-Jun-1971, 53 y.o., female Today's Date: 11/14/2023   END OF SESSION:  OT End of Session - 11/14/23 1100     Visit Number 2    Number of Visits 16    Date for OT Re-Evaluation 01/11/24    Authorization Type 1) UHC Dual Complete Medicare 2) [place] Medicaid    Authorization Time Period no auth required    Progress Note Due on Visit 10    OT Start Time 1027    OT Stop Time 1101    OT Time Calculation (min) 34 min    Activity Tolerance Patient tolerated treatment well    Behavior During Therapy WFL for tasks assessed/performed              Past Medical History:  Diagnosis Date   Asthma    seasonal   Bruising, spontaneous 12/08/2013   Cancer (HCC)    cervical cancer - had hysterectomy   Chronic back pain    Chronic back pain    DDD (degenerative disc disease), lumbar    DDD (degenerative disc disease), lumbar    Dysrhythmia    tachycardia   Fibromyalgia    Headache(784.0)    migraines   History of kidney stones    Ovarian cyst    Partial tear subscapularis tendon 12/27/2011   Scoliosis    Seizures (HCC)    started 9/12-   Past Surgical History:  Procedure Laterality Date   ABDOMINAL HYSTERECTOMY  2012   BACK SURGERY  1989   scoliosis throsic-rods   CESAREAN SECTION     CHOLECYSTECTOMY N/A 11/17/2012   Procedure: LAPAROSCOPIC CHOLECYSTECTOMY WITH INTRAOPERATIVE CHOLANGIOGRAM;  Surgeon: Shela Derby, MD;  Location: [facility];  Service: General;  Laterality: N/A;   COLONOSCOPY WITH PROPOFOL  N/A 04/21/2020   Procedure: COLONOSCOPY WITH PROPOFOL ;  Surgeon: Alvis Jourdain, MD;  Location: [facility];  Service: Endoscopy;  Laterality: N/A;   ENDOSCOPIC RETROGRADE CHOLANGIOPANCREATOGRAPHY (ERCP) WITH PROPOFOL  N/A 06/11/2019   Procedure: ENDOSCOPIC RETROGRADE CHOLANGIOPANCREATOGRAPHY (ERCP) WITH PROPOFOL ;  Surgeon: Alvis Jourdain, MD;  Location: [facility];  Service:  Endoscopy;  Laterality: N/A;   ERCP N/A 05/07/2013   Procedure: ENDOSCOPIC RETROGRADE CHOLANGIOPANCREATOGRAPHY (ERCP);  Surgeon: Almeda Aris, MD;  Location: [facility];  Service: Endoscopy;  Laterality: N/A;  start ercp first, if canulation fails switch to EUS    ESOPHAGOGASTRODUODENOSCOPY (EGD) WITH PROPOFOL  N/A 05/15/2019   Procedure: ESOPHAGOGASTRODUODENOSCOPY (EGD) WITH PROPOFOL ;  Surgeon: Alyce Jubilee, MD;  Location: [facility];  Service: Endoscopy;  Laterality: N/A;   ESOPHAGOGASTRODUODENOSCOPY (EGD) WITH PROPOFOL  N/A 03/03/2020   Procedure: ESOPHAGOGASTRODUODENOSCOPY (EGD) WITH PROPOFOL ;  Surgeon: Alvis Jourdain, MD;  Location: [facility];  Service: Endoscopy;  Laterality: N/A;   ESOPHAGOGASTRODUODENOSCOPY (EGD) WITH PROPOFOL  N/A 04/21/2020   Procedure: ESOPHAGOGASTRODUODENOSCOPY (EGD) WITH PROPOFOL ;  Surgeon: Alvis Jourdain, MD;  Location: [facility];  Service: Endoscopy;  Laterality: N/A;   EUS N/A 11/03/2012   Procedure: UPPER ENDOSCOPIC ULTRASOUND (EUS) LINEAR;  Surgeon: Almeda Aris, MD;  Location: [facility];  Service: Endoscopy;  Laterality: N/A;   left arm     NECK SURGERY     POLYPECTOMY  04/21/2020   Procedure: POLYPECTOMY;  Surgeon: Alvis Jourdain, MD;  Location: [facility];  Service: Endoscopy;;   REMOVAL OF STONES  06/11/2019   Procedure: REMOVAL OF STONES;  Surgeon: Alvis Jourdain, MD;  Location: [facility];  Service: Endoscopy;;   TOOTH EXTRACTION N/A 05/24/2022   Procedure: DENTAL RESTORATION/EXTRACTIONS;  Surgeon: Ascencion Lava, DMD;  Location: [facility] OR;  Service: Oral Surgery;  Laterality: N/A;   TOTAL HIP ARTHROPLASTY Left 11/17/2022   Procedure: TOTAL HIP ARTHROPLASTY ANTERIOR APPROACH;  Surgeon: Saundra Curl, MD;  Location: [facility];  Service: Orthopedics;  Laterality: Left;   TOTAL SHOULDER ARTHROPLASTY Left 11/04/2023   Procedure: ARTHROPLASTY, SHOULDER, TOTAL;  Surgeon: Osa Blase, MD;  Location: [facility];  Service: Orthopedics;  Laterality: Left;    TUBAL LIGATION     UPPER ESOPHAGEAL ENDOSCOPIC ULTRASOUND (EUS) N/A 06/11/2019   Procedure: UPPER ESOPHAGEAL ENDOSCOPIC ULTRASOUND (EUS);  Surgeon: Alvis Jourdain, MD;  Location: [facility];  Service: Endoscopy;  Laterality: N/A;   Patient Active Problem List   Diagnosis Date Noted   S/P reverse total shoulder arthroplasty, left 11/04/2023   Vertigo 05/28/2023   Palpitations 12/05/2022   Transaminitis 11/16/2022   Hip fracture (HCC) 11/15/2022   GERD (gastroesophageal reflux disease) 11/15/2022   Tobacco use 11/15/2022   Idiopathic hypotension 07/19/2022   Slurred speech 07/19/2022   Underweight 07/19/2022   Hypotension 07/19/2022   Seizure disorder (HCC) 07/18/2022   Chronic migraine w/o aura w/o status migrainosus, not intractable 07/18/2022   Cerebral cavernoma 07/18/2022   Acute GI bleed due to bleeding pyloric/gastric ulcer 05/15/2019   PUD (peptic ulcer disease)-EGD 05/15/2019 with bleeding pyloric/gastric ulcer, nonbleeding duodenal ulcers and gastritis    Hematemesis with nausea    Migraine headache 09/25/2015   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   Bilateral occipital neuralgia 01/02/2015   Migraine 01/02/2015   Chest pain of uncertain etiology 11/29/2014   DDD (degenerative disc disease), cervical 10/27/2014   DDD (degenerative disc disease), 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   DDD (degenerative disc disease), lumbosacral 10/27/2014   Hypokalemia 05/07/2013   Partial tear subscapularis tendon 12/27/2011    PCP: Dr. Kathyleen Parkins REFERRING PROVIDER: Dr. Osa Blase  ONSET DATE: 11/04/23  REFERRING DIAG: s/p left reverse TSA  THERAPY DIAG:  Acute pain of left shoulder  Stiffness of left shoulder, not elsewhere classified  Other symptoms and signs involving the musculoskeletal system  Rationale for Evaluation and Treatment: Rehabilitation  SUBJECTIVE:   SUBJECTIVE STATEMENT: S: "I' did not sleep at all last night" Pt accompanied by: self presents  in standard sling, reports   PRECAUTIONS: Shoulder-see protocol  WEIGHT BEARING RESTRICTIONS: Yes NWB  PAIN:  Are you having pain? Yes: NPRS scale: 4/10 Pain location: left shoulder Pain description: constant ache, some sharp pains Aggravating factors: movement Relieving factors: ice  FALLS: Has patient fallen in last 6 months? No  PLOF: Independent  PATIENT GOALS: To have less pain and more mobility.  NEXT MD VISIT: 11/19/23  OBJECTIVE:   HAND DOMINANCE: Right  ADLs: Overall ADLs: Pt is unable to use the LUE for any ADLs at this time. Pt reports difficulty with sleeping, waking up a lot, sleeping in the recliner, tried the bed last night with a little better sleep. Pt is unable to reach overhead or behind back, cannot lift items.   FUNCTIONAL OUTCOME MEASURES: Upper Extremity Functional Scale (UEFS): 8/80=10%  UPPER EXTREMITY ROM:       Assessed in supine, er/IR adducted  Passive ROM Left eval  Shoulder flexion 61  Shoulder abduction 62  Shoulder internal rotation 90  Shoulder external rotation 0  (Blank rows = not tested)    UPPER EXTREMITY MMT:     Not assessed due to protocol & pain  MMT Left eval  Shoulder flexion   Shoulder abduction   Shoulder internal rotation  Shoulder external rotation   (Blank rows = not tested)   SENSATION: Pt reports intermittent numbness in the elbow  EDEMA: None  COGNITION: Overall cognitive status: Within functional limits for tasks assessed  OBSERVATIONS: pt with mod fascial restrictions along upper arm, anterior shoulder, trapezius and scapular regions   TODAY'S TREATMENT:                                                                                                                              DATE:   11/14/23  -Manual techniques: to upper arm, pectoralis region, and trapezius; mod fascial restrictions- manual completed to reduce fascial restrictions, decrease pain, and improve ROM  -P/ROM: supine, shoulder  flexion, abduction, internal/external rotation 10x -Scapular A/ROM: sitting, elevation, depression, protraction, retraction 10x  PATIENT EDUCATION: Education details: pendulums, table slides (no abduction) wrist A/ROM, elbow self-ROM Person educated: Patient Education method: Programmer, multimedia, Demonstration, and Handouts Education comprehension: verbalized understanding and returned demonstration  HOME EXERCISE PROGRAM: Eval: pendulums, table slides (no abduction) wrist A/ROM, elbow self-ROM  GOALS: Goals reviewed with patient? Yes   SHORT TERM GOALS: Target date: 12/12/23  Pt will be provided with and educated on HEP to improve mobility in LUE required for use during ADL completion.   Goal status: INITIAL  2.  Pt will increase LUE P/ROM by 50+ degrees to improve ability to use LUE during dressing tasks with minimal compensatory techniques.   Goal status: INITIAL  3.  Pt will increase LUE strength to 3+/5 to improve ability to reach for items at waist to chest height during bathing and grooming tasks.   Goal status: INITIAL   LONG TERM GOALS: Target date: 01/11/24  Pt will decrease pain in LUE to 3/10 or less to improve ability to sleep for 2+ consecutive hours without waking due to pain.   Goal status: INITIAL  2.  Pt will decrease LUE fascial restrictions to min amounts or less to improve mobility required for functional reaching tasks.   Goal status: INITIAL  3.  Pt will increase LUE A/ROM by 75+ degrees to improve ability to use LUE when reaching overhead or behind back during dressing and bathing tasks.   Goal status: INITIAL  4.  Pt will increase LUE strength to 4+/5 or greater to improve ability to use LUE when lifting or carrying items during meal preparation/housework/yardwork tasks.   Goal status: INITIAL  5.  Pt will return to highest level of function using LUE as non-dominant during functional task completion.   Goal status:  INITIAL   ASSESSMENT:  CLINICAL IMPRESSION: Patient reports that she is completing HEP at home and the exercises are going well. She stated that she began sleeping in her bed last night, propped up, with pillows under arm- she reports that she did not sleep well. Initiated manual techniques; mod restrictions noted in trapezius, pectoralis region, and upper arm. Began P/ROM in supine, reached 75% shoulder flexion. She stated that she has a follow  up MD appointment next week. Added scapular ROM to HEP.    PERFORMANCE DEFICITS: in functional skills including in functional skills including ADLs, IADLs, coordination, tone, ROM, strength, pain, fascial restrictions, muscle spasms, and UE functional use  IMPAIRMENTS: are limiting patient from ADLs, IADLs, rest and sleep, work, and leisure.   COMORBIDITIES: has no other co-morbidities that affects occupational performance. Patient will benefit from skilled OT to address above impairments and improve overall function.  MODIFICATION OR ASSISTANCE TO COMPLETE EVALUATION: No modification of tasks or assist necessary to complete an evaluation.  OT OCCUPATIONAL PROFILE AND HISTORY: Problem focused assessment: Including review of records relating to presenting problem.  CLINICAL DECISION MAKING: LOW - limited treatment options, no task modification necessary  REHAB POTENTIAL: Good  EVALUATION COMPLEXITY: Low      PLAN:  OT FREQUENCY: 2x/week  OT DURATION: 8 weeks  PLANNED INTERVENTIONS: 97168 OT Re-evaluation, 97535 self care/ADL training, 16109 therapeutic exercise, 97530 therapeutic activity, 97112 neuromuscular re-education, 97140 manual therapy, 97014 electrical stimulation unattended, patient/family education, and DME and/or AE instructions  RECOMMENDED OTHER SERVICES: None at this time  CONSULTED AND AGREED WITH PLAN OF CARE: Patient  PLAN FOR NEXT SESSION: Follow up on HEP, initiate manual techniques, scapular A/ROM, P/ROM   Azell Boll, OTR/L  [phone] 11/14/2023, 11:04 AM

## 2023-11-19 ENCOUNTER — Ambulatory Visit (HOSPITAL_COMMUNITY): Admitting: Occupational Therapy

## 2023-11-19 ENCOUNTER — Encounter (HOSPITAL_COMMUNITY): Payer: Self-pay | Admitting: Occupational Therapy

## 2023-11-19 DIAGNOSIS — M25612 Stiffness of left shoulder, not elsewhere classified: Secondary | ICD-10-CM

## 2023-11-19 DIAGNOSIS — M25512 Pain in left shoulder: Secondary | ICD-10-CM | POA: Diagnosis not present

## 2023-11-19 DIAGNOSIS — R29898 Other symptoms and signs involving the musculoskeletal system: Secondary | ICD-10-CM

## 2023-11-19 NOTE — Therapy (Signed)
 OUTPATIENT OCCUPATIONAL THERAPY ORTHO TREATMENT  [name] MRN: [id number] DOB:Jan 16, 1971, 53 y.o., female Today's Date: 11/19/2023   END OF SESSION:  OT End of Session - 11/19/23 1054     Visit Number 3    Number of Visits 16    Date for OT Re-Evaluation 01/11/24   progress note 12/12/23   Authorization Type 1) UHC Dual Complete Medicare 2) [place] Medicaid    Authorization Time Period no auth required    Progress Note Due on Visit 10    OT Start Time 1023   pt arrived late   OT Stop Time 1058    OT Time Calculation (min) 35 min    Activity Tolerance Patient tolerated treatment well    Behavior During Therapy WFL for tasks assessed/performed               Past Medical History:  Diagnosis Date   Asthma    seasonal   Bruising, spontaneous 12/08/2013   Cancer (HCC)    cervical cancer - had hysterectomy   Chronic back pain    Chronic back pain    DDD (degenerative disc disease), lumbar    DDD (degenerative disc disease), lumbar    Dysrhythmia    tachycardia   Fibromyalgia    Headache(784.0)    migraines   History of kidney stones    Ovarian cyst    Partial tear subscapularis tendon 12/27/2011   Scoliosis    Seizures (HCC)    started 9/12-   Past Surgical History:  Procedure Laterality Date   ABDOMINAL HYSTERECTOMY  2012   BACK SURGERY  1989   scoliosis throsic-rods   CESAREAN SECTION     CHOLECYSTECTOMY N/A 11/17/2012   Procedure: LAPAROSCOPIC CHOLECYSTECTOMY WITH INTRAOPERATIVE CHOLANGIOGRAM;  Surgeon: Shela Derby, MD;  [facility];  Service: General;  Laterality: N/A;   COLONOSCOPY WITH PROPOFOL  N/A 04/21/2020   Procedure: COLONOSCOPY WITH PROPOFOL ;  Surgeon: Alvis Jourdain, MD;  [facility]: [facility];  Service: Endoscopy;  Laterality: N/A;   ENDOSCOPIC RETROGRADE CHOLANGIOPANCREATOGRAPHY (ERCP) WITH PROPOFOL  N/A 06/11/2019   Procedure: ENDOSCOPIC RETROGRADE CHOLANGIOPANCREATOGRAPHY (ERCP) WITH PROPOFOL ;  Surgeon: Alvis Jourdain,  MD;  [facility];  Service: Endoscopy;  Laterality: N/A;   ERCP N/A 05/07/2013   Procedure: ENDOSCOPIC RETROGRADE CHOLANGIOPANCREATOGRAPHY (ERCP);  Surgeon: Almeda Aris, MD;  Location: [facility];  Service: Endoscopy;  Laterality: N/A;  start ercp first, if canulation fails switch to EUS    ESOPHAGOGASTRODUODENOSCOPY (EGD) WITH PROPOFOL  N/A 05/15/2019   Procedure: ESOPHAGOGASTRODUODENOSCOPY (EGD) WITH PROPOFOL ;  Surgeon: Alyce Jubilee, MD;  [facility]: [facility];  Service: Endoscopy;  Laterality: N/A;   ESOPHAGOGASTRODUODENOSCOPY (EGD) WITH PROPOFOL  N/A 03/03/2020   Procedure: ESOPHAGOGASTRODUODENOSCOPY (EGD) WITH PROPOFOL ;  Surgeon: Alvis Jourdain, MD;  [facility];  Service: Endoscopy;  Laterality: N/A;   ESOPHAGOGASTRODUODENOSCOPY (EGD) WITH PROPOFOL  N/A 04/21/2020   Procedure: ESOPHAGOGASTRODUODENOSCOPY (EGD) WITH PROPOFOL ;  Surgeon: Alvis Jourdain, MD;  [facility];  Service: Endoscopy;  Laterality: N/A;   EUS N/A 11/03/2012   Procedure: UPPER ENDOSCOPIC ULTRASOUND (EUS) LINEAR;  Surgeon: Almeda Aris, MD;  [facility]: [facility];  Service: Endoscopy;  Laterality: N/A;   left arm     NECK SURGERY     POLYPECTOMY  04/21/2020   Procedure: POLYPECTOMY;  Surgeon: Alvis Jourdain, MD;  [facility];  Service: Endoscopy;;   REMOVAL OF STONES  06/11/2019   Procedure: REMOVAL OF STONES;  Surgeon: Alvis Jourdain, MD;  [facility];  Service: Endoscopy;;  TOOTH EXTRACTION N/A 05/24/2022   Procedure: DENTAL RESTORATION/EXTRACTIONS;  Surgeon: Ascencion Lava, DMD;  [facility];  Service: Oral Surgery;  Laterality: N/A;   TOTAL HIP ARTHROPLASTY Left 11/17/2022   Procedure: TOTAL HIP ARTHROPLASTY ANTERIOR APPROACH;  Surgeon: Saundra Curl, MD;  [facility];  Service: Orthopedics;  Laterality: Left;   TOTAL SHOULDER ARTHROPLASTY Left 11/04/2023   Procedure: ARTHROPLASTY, SHOULDER, TOTAL;  Surgeon: Osa Blase, MD;  [facility]: [facility];   Service: Orthopedics;  Laterality: Left;   TUBAL LIGATION     UPPER ESOPHAGEAL ENDOSCOPIC ULTRASOUND (EUS) N/A 06/11/2019   Procedure: UPPER ESOPHAGEAL ENDOSCOPIC ULTRASOUND (EUS);  Surgeon: Alvis Jourdain, MD;  Location: [facility];  Service: Endoscopy;  Laterality: N/A;   Patient Active Problem List   Diagnosis Date Noted   S/P reverse total shoulder arthroplasty, left 11/04/2023   Vertigo 05/28/2023   Palpitations 12/05/2022   Transaminitis 11/16/2022   Hip fracture (HCC) 11/15/2022   GERD (gastroesophageal reflux disease) 11/15/2022   Tobacco use 11/15/2022   Idiopathic hypotension 07/19/2022   Slurred speech 07/19/2022   Underweight 07/19/2022   Hypotension 07/19/2022   Seizure disorder (HCC) 07/18/2022   Chronic migraine w/o aura w/o status migrainosus, not intractable 07/18/2022   Cerebral cavernoma 07/18/2022   Acute GI bleed due to bleeding pyloric/gastric ulcer 05/15/2019   PUD (peptic ulcer disease)-EGD 05/15/2019 with bleeding pyloric/gastric ulcer, nonbleeding duodenal ulcers and gastritis    Hematemesis with nausea    Migraine headache 09/25/2015   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   Bilateral occipital neuralgia 01/02/2015   Migraine 01/02/2015   Chest pain of uncertain etiology 11/29/2014   DDD (degenerative disc disease), cervical 10/27/2014   DDD (degenerative disc disease), 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   DDD (degenerative disc disease), lumbosacral 10/27/2014   Hypokalemia 05/07/2013   Partial tear subscapularis tendon 12/27/2011    PCP: Dr. Kathyleen Parkins REFERRING PROVIDER: Dr. Osa Blase  ONSET DATE: 11/04/23  REFERRING DIAG: s/p left reverse TSA  THERAPY DIAG:  Acute pain of left shoulder  Stiffness of left shoulder, not elsewhere classified  Other symptoms and signs involving the musculoskeletal system  Rationale for Evaluation and Treatment: Rehabilitation  SUBJECTIVE:   SUBJECTIVE STATEMENT: S: "I woke up at 4am in so much  pain."  PRECAUTIONS: Shoulder-see protocol  WEIGHT BEARING RESTRICTIONS: Yes NWB  PAIN:  Are you having pain? Yes: NPRS scale: 5/10 Pain location: left shoulder Pain description: constant ache Aggravating factors: movement Relieving factors: ice  FALLS: Has patient fallen in last 6 months? No  PLOF: Independent  PATIENT GOALS: To have less pain and more mobility.  NEXT MD VISIT: 11/19/23  OBJECTIVE:   HAND DOMINANCE: Right  ADLs: Overall ADLs: Pt is unable to use the LUE for any ADLs at this time. Pt reports difficulty with sleeping, waking up a lot, sleeping in the recliner, tried the bed last night with a little better sleep. Pt is unable to reach overhead or behind back, cannot lift items.   FUNCTIONAL OUTCOME MEASURES: Upper Extremity Functional Scale (UEFS): 8/80=10%  UPPER EXTREMITY ROM:       Assessed in supine, er/IR adducted  Passive ROM Left eval  Shoulder flexion 61  Shoulder abduction 62  Shoulder internal rotation 90  Shoulder external rotation 0  (Blank rows = not tested)    UPPER EXTREMITY MMT:     Not assessed due to protocol & pain  MMT Left eval  Shoulder flexion   Shoulder abduction   Shoulder internal rotation  Shoulder external rotation   (Blank rows = not tested)   SENSATION: Pt reports intermittent numbness in the elbow  OBSERVATIONS: pt with mod fascial restrictions along upper arm, anterior shoulder, trapezius and scapular regions   TODAY'S TREATMENT:                                                                                                                              DATE:   11/19/23 -Manual techniques: to upper arm, pectoralis region, and trapezius; mod fascial restrictions- manual completed to reduce fascial restrictions, decrease pain, and improve ROM  -P/ROM: supine, shoulder flexion, abduction, internal/external rotation 10 reps -Scapular A/ROM: sitting, elevation, depression, protraction, retraction 10  reps -Pendulums: side to side, forward and backward, circles, 1' each -Therapy ball rolls: flexion, 10 reps  11/14/23 -Manual techniques: to upper arm, pectoralis region, and trapezius; mod fascial restrictions- manual completed to reduce fascial restrictions, decrease pain, and improve ROM  -P/ROM: supine, shoulder flexion, abduction, internal/external rotation 10x -Scapular A/ROM: sitting, elevation, depression, protraction, retraction 10x  PATIENT EDUCATION: Education details: reviewed HEP Person educated: Patient Education method: Programmer, multimedia, Demonstration, and Handouts Education comprehension: verbalized understanding and returned demonstration  HOME EXERCISE PROGRAM: Eval: pendulums, table slides (no abduction) wrist A/ROM, elbow self-ROM  GOALS: Goals reviewed with patient? Yes   SHORT TERM GOALS: Target date: 12/12/23  Pt will be provided with and educated on HEP to improve mobility in LUE required for use during ADL completion.   Goal status: IN PROGRESS  2.  Pt will increase LUE P/ROM by 50+ degrees to improve ability to use LUE during dressing tasks with minimal compensatory techniques.   Goal status: IN PROGRESS  3.  Pt will increase LUE strength to 3+/5 to improve ability to reach for items at waist to chest height during bathing and grooming tasks.   Goal status: IN PROGRESS   LONG TERM GOALS: Target date: 01/11/24  Pt will decrease pain in LUE to 3/10 or less to improve ability to sleep for 2+ consecutive hours without waking due to pain.   Goal status: IN PROGRESS  2.  Pt will decrease LUE fascial restrictions to min amounts or less to improve mobility required for functional reaching tasks.   Goal status: IN PROGRESS  3.  Pt will increase LUE A/ROM by 75+ degrees to improve ability to use LUE when reaching overhead or behind back during dressing and bathing tasks.   Goal status: IN PROGRESS  4.  Pt will increase LUE strength to 4+/5 or greater to  improve ability to use LUE when lifting or carrying items during meal preparation/housework/yardwork tasks.   Goal status: IN PROGRESS  5.  Pt will return to highest level of function using LUE as non-dominant during functional task completion.   Goal status: IN PROGRESS   ASSESSMENT:  CLINICAL IMPRESSION: Patient reports high pain this morning due to the weather, heat and ice help. Pt reports she hs been completing her HEP  diligently. Pt with improved ROM and decreased guarding during passive stretching today. Improved tolerance to manual techniques, ROM at approximately 50% passively. Added therapy ball roll in flexion, ROM to ~75 degrees. Verbal cuing for form and technique.     PERFORMANCE DEFICITS: in functional skills including in functional skills including ADLs, IADLs, coordination, tone, ROM, strength, pain, fascial restrictions, muscle spasms, and UE functional use     PLAN:  OT FREQUENCY: 2x/week  OT DURATION: 8 weeks  PLANNED INTERVENTIONS: 97168 OT Re-evaluation, 97535 self care/ADL training, 40981 therapeutic exercise, 97530 therapeutic activity, 97112 neuromuscular re-education, 97140 manual therapy, 97014 electrical stimulation unattended, patient/family education, and DME and/or AE instructions  CONSULTED AND AGREED WITH PLAN OF CARE: Patient  PLAN FOR NEXT SESSION: Follow up on MD appt,  manual techniques, scapular A/ROM, P/ROM   Lafonda Piety, OTR/L  [phone] 11/19/2023, 11:05 AM

## 2023-11-25 ENCOUNTER — Ambulatory Visit (HOSPITAL_COMMUNITY): Attending: Orthopedic Surgery | Admitting: Occupational Therapy

## 2023-11-25 ENCOUNTER — Encounter (HOSPITAL_COMMUNITY): Payer: Self-pay | Admitting: Occupational Therapy

## 2023-11-25 DIAGNOSIS — M25612 Stiffness of left shoulder, not elsewhere classified: Secondary | ICD-10-CM | POA: Diagnosis present

## 2023-11-25 DIAGNOSIS — R29898 Other symptoms and signs involving the musculoskeletal system: Secondary | ICD-10-CM

## 2023-11-25 DIAGNOSIS — M25512 Pain in left shoulder: Secondary | ICD-10-CM

## 2023-11-25 NOTE — Therapy (Signed)
 OUTPATIENT OCCUPATIONAL THERAPY ORTHO TREATMENT  [name] MRN: [id number] DOB:20-Apr-1971, 53 y.o., female Today's Date: 11/25/2023   END OF SESSION:  OT End of Session - 11/25/23 0931     Visit Number 4    Number of Visits 16    Date for OT Re-Evaluation 01/11/24   progress note 12/12/23   Authorization Type 1) UHC Dual Complete Medicare 2) [place] Medicaid    Authorization Time Period no auth required    Progress Note Due on Visit 10    OT Start Time [phone]   pt arrived late   OT Stop Time 0928    OT Time Calculation (min) 36 min    Activity Tolerance Patient tolerated treatment well    Behavior During Therapy [facility] for tasks assessed/performed                Past Medical History:  Diagnosis Date   Asthma    seasonal   Bruising, spontaneous 12/08/2013   Cancer (HCC)    cervical cancer - had hysterectomy   Chronic back pain    Chronic back pain    DDD (degenerative disc disease), lumbar    DDD (degenerative disc disease), lumbar    Dysrhythmia    tachycardia   Fibromyalgia    Headache(784.0)    migraines   History of kidney stones    Ovarian cyst    Partial tear subscapularis tendon 12/27/2011   Scoliosis    Seizures (HCC)    started 9/12-   Past Surgical History:  Procedure Laterality Date   ABDOMINAL HYSTERECTOMY  2012   BACK SURGERY  1989   scoliosis throsic-rods   CESAREAN SECTION     CHOLECYSTECTOMY N/A 11/17/2012   Procedure: LAPAROSCOPIC CHOLECYSTECTOMY WITH INTRAOPERATIVE CHOLANGIOGRAM;  Surgeon: Shela Derby, MD;  [facility]: [facility];  Service: General;  Laterality: N/A;   COLONOSCOPY WITH PROPOFOL  N/A 04/21/2020   Procedure: COLONOSCOPY WITH PROPOFOL ;  Surgeon: Alvis Jourdain, MD;  [facility]: [facility];  Service: Endoscopy;  Laterality: N/A;   ENDOSCOPIC RETROGRADE CHOLANGIOPANCREATOGRAPHY (ERCP) WITH PROPOFOL  N/A 06/11/2019   Procedure: ENDOSCOPIC RETROGRADE CHOLANGIOPANCREATOGRAPHY (ERCP) WITH PROPOFOL ;  Surgeon: Alvis Jourdain,  MD;  Location: [facility];  Service: Endoscopy;  Laterality: N/A;   ERCP N/A 05/07/2013   Procedure: ENDOSCOPIC RETROGRADE CHOLANGIOPANCREATOGRAPHY (ERCP);  Surgeon: Almeda Aris, MD;  Location: [facility];  Service: Endoscopy;  Laterality: N/A;  start ercp first, if canulation fails switch to EUS    ESOPHAGOGASTRODUODENOSCOPY (EGD) WITH PROPOFOL  N/A 05/15/2019   Procedure: ESOPHAGOGASTRODUODENOSCOPY (EGD) WITH PROPOFOL ;  Surgeon: Alyce Jubilee, MD;  [facility]: [facility];  Service: Endoscopy;  Laterality: N/A;   ESOPHAGOGASTRODUODENOSCOPY (EGD) WITH PROPOFOL  N/A 03/03/2020   Procedure: ESOPHAGOGASTRODUODENOSCOPY (EGD) WITH PROPOFOL ;  Surgeon: Alvis Jourdain, MD;  [facility]: [facility];  Service: Endoscopy;  Laterality: N/A;   ESOPHAGOGASTRODUODENOSCOPY (EGD) WITH PROPOFOL  N/A 04/21/2020   Procedure: ESOPHAGOGASTRODUODENOSCOPY (EGD) WITH PROPOFOL ;  Surgeon: Alvis Jourdain, MD;  Location: [facility];  Service: Endoscopy;  Laterality: N/A;   EUS N/A 11/03/2012   Procedure: UPPER ENDOSCOPIC ULTRASOUND (EUS) LINEAR;  Surgeon: Almeda Aris, MD;  Location: [facility];  Service: Endoscopy;  Laterality: N/A;   left arm     NECK SURGERY     POLYPECTOMY  04/21/2020   Procedure: POLYPECTOMY;  Surgeon: Alvis Jourdain, MD;  Location: [facility];  Service: Endoscopy;;   REMOVAL OF STONES  06/11/2019   Procedure: REMOVAL OF STONES;  Surgeon: Alvis Jourdain, MD;  Location: [facility];  Service: Endoscopy;;  TOOTH EXTRACTION N/A 05/24/2022   Procedure: DENTAL RESTORATION/EXTRACTIONS;  Surgeon: Ascencion Lava, DMD;  Location: [facility];  Service: Oral Surgery;  Laterality: N/A;   TOTAL HIP ARTHROPLASTY Left 11/17/2022   Procedure: TOTAL HIP ARTHROPLASTY ANTERIOR APPROACH;  Surgeon: Saundra Curl, MD;  Location: [facility];  Service: Orthopedics;  Laterality: Left;   TOTAL SHOULDER ARTHROPLASTY Left 11/04/2023   Procedure: ARTHROPLASTY, SHOULDER, TOTAL;  Surgeon: Osa Blase, MD;  Location: [facility];   Service: Orthopedics;  Laterality: Left;   TUBAL LIGATION     UPPER ESOPHAGEAL ENDOSCOPIC ULTRASOUND (EUS) N/A 06/11/2019   Procedure: UPPER ESOPHAGEAL ENDOSCOPIC ULTRASOUND (EUS);  Surgeon: Alvis Jourdain, MD;  Location: [facility];  Service: Endoscopy;  Laterality: N/A;   Patient Active Problem List   Diagnosis Date Noted   S/P reverse total shoulder arthroplasty, left 11/04/2023   Vertigo 05/28/2023   Palpitations 12/05/2022   Transaminitis 11/16/2022   Hip fracture (HCC) 11/15/2022   GERD (gastroesophageal reflux disease) 11/15/2022   Tobacco use 11/15/2022   Idiopathic hypotension 07/19/2022   Slurred speech 07/19/2022   Underweight 07/19/2022   Hypotension 07/19/2022   Seizure disorder (HCC) 07/18/2022   Chronic migraine w/o aura w/o status migrainosus, not intractable 07/18/2022   Cerebral cavernoma 07/18/2022   Acute GI bleed due to bleeding pyloric/gastric ulcer 05/15/2019   PUD (peptic ulcer disease)-EGD 05/15/2019 with bleeding pyloric/gastric ulcer, nonbleeding duodenal ulcers and gastritis    Hematemesis with nausea    Migraine headache 09/25/2015   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   Bilateral occipital neuralgia 01/02/2015   Migraine 01/02/2015   Chest pain of uncertain etiology 11/29/2014   DDD (degenerative disc disease), cervical 10/27/2014   DDD (degenerative disc disease), 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   DDD (degenerative disc disease), lumbosacral 10/27/2014   Hypokalemia 05/07/2013   Partial tear subscapularis tendon 12/27/2011    PCP: Dr. Kathyleen Parkins REFERRING PROVIDER: Dr. Osa Blase  ONSET DATE: 11/04/23  REFERRING DIAG: s/p left reverse TSA  THERAPY DIAG:  Acute pain of left shoulder  Stiffness of left shoulder, not elsewhere classified  Other symptoms and signs involving the musculoskeletal system  Rationale for Evaluation and Treatment: Rehabilitation  SUBJECTIVE:   SUBJECTIVE STATEMENT: S: "I slept without the sling last  night."   PRECAUTIONS: Shoulder-see protocol  WEIGHT BEARING RESTRICTIONS: Yes NWB  PAIN:  Are you having pain? Yes: NPRS scale: 3/10 Pain location: left shoulder Pain description: constant ache Aggravating factors: movement Relieving factors: ice  FALLS: Has patient fallen in last 6 months? No  PLOF: Independent  PATIENT GOALS: To have less pain and more mobility.  NEXT MD VISIT: 12/19/23  OBJECTIVE:   HAND DOMINANCE: Right  ADLs: Overall ADLs: Pt is unable to use the LUE for any ADLs at this time. Pt reports difficulty with sleeping, waking up a lot, sleeping in the recliner, tried the bed last night with a little better sleep. Pt is unable to reach overhead or behind back, cannot lift items.   FUNCTIONAL OUTCOME MEASURES: Upper Extremity Functional Scale (UEFS): 8/80=10%  UPPER EXTREMITY ROM:       Assessed in supine, er/IR adducted  Passive ROM Left eval  Shoulder flexion 61  Shoulder abduction 62  Shoulder internal rotation 90  Shoulder external rotation 0  (Blank rows = not tested)    UPPER EXTREMITY MMT:     Not assessed due to protocol & pain  MMT Left eval  Shoulder flexion   Shoulder abduction   Shoulder internal rotation   Shoulder  external rotation   (Blank rows = not tested)   SENSATION: Pt reports intermittent numbness in the elbow  OBSERVATIONS: pt with mod fascial restrictions along upper arm, anterior shoulder, trapezius and scapular regions   TODAY'S TREATMENT:                                                                                                                              DATE:   11/25/23 -Manual techniques: to upper arm, pectoralis region, and trapezius; mod fascial restrictions- manual completed to reduce fascial restrictions, decrease pain, and improve ROM  -P/ROM: supine, shoulder flexion, abduction, internal/external rotation 10 reps -Scapular A/ROM: sitting, elevation, depression, protraction, retraction 10  reps -Pendulums: side to side, forward and backward, circles, 1' each -Therapy ball rolls: flexion, abduction, 10 reps  11/19/23 -Manual techniques: to upper arm, pectoralis region, and trapezius; mod fascial restrictions- manual completed to reduce fascial restrictions, decrease pain, and improve ROM  -P/ROM: supine, shoulder flexion, abduction, internal/external rotation 10 reps -Scapular A/ROM: sitting, elevation, depression, protraction, retraction 10 reps -Pendulums: side to side, forward and backward, circles, 1' each -Therapy ball rolls: flexion, 10 reps  11/14/23 -Manual techniques: to upper arm, pectoralis region, and trapezius; mod fascial restrictions- manual completed to reduce fascial restrictions, decrease pain, and improve ROM  -P/ROM: supine, shoulder flexion, abduction, internal/external rotation 10x -Scapular A/ROM: sitting, elevation, depression, protraction, retraction 10x  PATIENT EDUCATION: Education details: reviewed HEP Person educated: Patient Education method: Programmer, multimedia, Demonstration, and Handouts Education comprehension: verbalized understanding and returned demonstration  HOME EXERCISE PROGRAM: Eval: pendulums, table slides (no abduction) wrist A/ROM, elbow self-ROM  GOALS: Goals reviewed with patient? Yes   SHORT TERM GOALS: Target date: 12/12/23  Pt will be provided with and educated on HEP to improve mobility in LUE required for use during ADL completion.   Goal status: IN PROGRESS  2.  Pt will increase LUE P/ROM by 50+ degrees to improve ability to use LUE during dressing tasks with minimal compensatory techniques.   Goal status: IN PROGRESS  3.  Pt will increase LUE strength to 3+/5 to improve ability to reach for items at waist to chest height during bathing and grooming tasks.   Goal status: IN PROGRESS   LONG TERM GOALS: Target date: 01/11/24  Pt will decrease pain in LUE to 3/10 or less to improve ability to sleep for 2+ consecutive  hours without waking due to pain.   Goal status: IN PROGRESS  2.  Pt will decrease LUE fascial restrictions to min amounts or less to improve mobility required for functional reaching tasks.   Goal status: IN PROGRESS  3.  Pt will increase LUE A/ROM by 75+ degrees to improve ability to use LUE when reaching overhead or behind back during dressing and bathing tasks.   Goal status: IN PROGRESS  4.  Pt will increase LUE strength to 4+/5 or greater to improve ability to use LUE when lifting or carrying items during meal preparation/housework/yardwork  tasks.   Goal status: IN PROGRESS  5.  Pt will return to highest level of function using LUE as non-dominant during functional task completion.   Goal status: IN PROGRESS   ASSESSMENT:  CLINICAL IMPRESSION: Patient reports MD removed bandage and place steri-strips. Pt removed sling for a lot of the day yesterday, has been completing elbow A/ROM. Continued with manual techniques, placed moist heat at elbow while working on shoulder with improvement in elbow mobility and guarding. Pt tolerating passive stretching to approximately 50% for flexion and abduction, er limited to <20 degrees. Pt pain limited during session, verbal cuing for form and technique.     PERFORMANCE DEFICITS: in functional skills including in functional skills including ADLs, IADLs, coordination, tone, ROM, strength, pain, fascial restrictions, muscle spasms, and UE functional use     PLAN:  OT FREQUENCY: 2x/week  OT DURATION: 8 weeks  PLANNED INTERVENTIONS: 97168 OT Re-evaluation, 97535 self care/ADL training, 14782 therapeutic exercise, 97530 therapeutic activity, 97112 neuromuscular re-education, 97140 manual therapy, 97014 electrical stimulation unattended, patient/family education, and DME and/or AE instructions  CONSULTED AND AGREED WITH PLAN OF CARE: Patient  PLAN FOR NEXT SESSION: manual techniques, scapular A/ROM, P/ROM    Lafonda Piety, OTR/L   [phone] 11/25/2023, 9:31 AM

## 2023-11-27 ENCOUNTER — Ambulatory Visit (HOSPITAL_COMMUNITY): Admitting: Occupational Therapy

## 2023-11-27 DIAGNOSIS — M25512 Pain in left shoulder: Secondary | ICD-10-CM | POA: Diagnosis not present

## 2023-11-27 DIAGNOSIS — R29898 Other symptoms and signs involving the musculoskeletal system: Secondary | ICD-10-CM

## 2023-11-27 DIAGNOSIS — M25612 Stiffness of left shoulder, not elsewhere classified: Secondary | ICD-10-CM

## 2023-11-27 NOTE — Therapy (Signed)
 OUTPATIENT OCCUPATIONAL THERAPY ORTHO TREATMENT  [name] MRN: [id number] DOB:Oct 21, 1970, 53 y.o., female Today's Date: 11/27/2023   END OF SESSION:  OT End of Session - 11/27/23 1019     Visit Number 5    Number of Visits 16    Date for OT Re-Evaluation 01/11/24   progress note 12/12/23   Authorization Type 1) UHC Dual Complete Medicare 2)  Medicaid    Authorization Time Period no auth required    Progress Note Due on Visit 10    OT Start Time [phone]    OT Stop Time 1015    OT Time Calculation (min) 38 min    Activity Tolerance Patient tolerated treatment well    Behavior During Therapy WFL for tasks assessed/performed                 Past Medical History:  Diagnosis Date   Asthma    seasonal   Bruising, spontaneous 12/08/2013   Cancer (HCC)    cervical cancer - had hysterectomy   Chronic back pain    Chronic back pain    DDD (degenerative disc disease), lumbar    DDD (degenerative disc disease), lumbar    Dysrhythmia    tachycardia   Fibromyalgia    Headache(784.0)    migraines   History of kidney stones    Ovarian cyst    Partial tear subscapularis tendon 12/27/2011   Scoliosis    Seizures (HCC)    started 9/12-   Past Surgical History:  Procedure Laterality Date   ABDOMINAL HYSTERECTOMY  2012   BACK SURGERY  1989   scoliosis throsic-rods   CESAREAN SECTION     CHOLECYSTECTOMY N/A 11/17/2012   Procedure: LAPAROSCOPIC CHOLECYSTECTOMY WITH INTRAOPERATIVE CHOLANGIOGRAM;  Surgeon: Shela Derby, MD;  Location: [facility];  Service: General;  Laterality: N/A;   COLONOSCOPY WITH PROPOFOL  N/A 04/21/2020   Procedure: COLONOSCOPY WITH PROPOFOL ;  Surgeon: Alvis Jourdain, MD;  Location: [facility];  Service: Endoscopy;  Laterality: N/A;   ENDOSCOPIC RETROGRADE CHOLANGIOPANCREATOGRAPHY (ERCP) WITH PROPOFOL  N/A 06/11/2019   Procedure: ENDOSCOPIC RETROGRADE CHOLANGIOPANCREATOGRAPHY (ERCP) WITH PROPOFOL ;  Surgeon: Alvis Jourdain, MD;  Location:  [facility];  Service: Endoscopy;  Laterality: N/A;   ERCP N/A 05/07/2013   Procedure: ENDOSCOPIC RETROGRADE CHOLANGIOPANCREATOGRAPHY (ERCP);  Surgeon: Almeda Aris, MD;  Location: [facility];  Service: Endoscopy;  Laterality: N/A;  start ercp first, if canulation fails switch to EUS    ESOPHAGOGASTRODUODENOSCOPY (EGD) WITH PROPOFOL  N/A 05/15/2019   Procedure: ESOPHAGOGASTRODUODENOSCOPY (EGD) WITH PROPOFOL ;  Surgeon: Alyce Jubilee, MD;  Location: [facility];  Service: Endoscopy;  Laterality: N/A;   ESOPHAGOGASTRODUODENOSCOPY (EGD) WITH PROPOFOL  N/A 03/03/2020   Procedure: ESOPHAGOGASTRODUODENOSCOPY (EGD) WITH PROPOFOL ;  Surgeon: Alvis Jourdain, MD;  [facility]: [facility];  Service: Endoscopy;  Laterality: N/A;   ESOPHAGOGASTRODUODENOSCOPY (EGD) WITH PROPOFOL  N/A 04/21/2020   Procedure: ESOPHAGOGASTRODUODENOSCOPY (EGD) WITH PROPOFOL ;  Surgeon: Alvis Jourdain, MD;  [facility]: [facility];  Service: Endoscopy;  Laterality: N/A;   EUS N/A 11/03/2012   Procedure: UPPER ENDOSCOPIC ULTRASOUND (EUS) LINEAR;  Surgeon: Almeda Aris, MD;  Location: [facility];  Service: Endoscopy;  Laterality: N/A;   left arm     NECK SURGERY     POLYPECTOMY  04/21/2020   Procedure: POLYPECTOMY;  Surgeon: Alvis Jourdain, MD;  Location: [facility];  Service: Endoscopy;;   REMOVAL OF STONES  06/11/2019   Procedure: REMOVAL OF STONES;  Surgeon: Alvis Jourdain, MD;  Location: [facility];  Service: Endoscopy;;   TOOTH EXTRACTION  N/A 05/24/2022   Procedure: DENTAL RESTORATION/EXTRACTIONS;  Surgeon: Ascencion Lava, DMD;  Location: [facility];  Service: Oral Surgery;  Laterality: N/A;   TOTAL HIP ARTHROPLASTY Left 11/17/2022   Procedure: TOTAL HIP ARTHROPLASTY ANTERIOR APPROACH;  Surgeon: Saundra Curl, MD;  Location: [facility];  Service: Orthopedics;  Laterality: Left;   TOTAL SHOULDER ARTHROPLASTY Left 11/04/2023   Procedure: ARTHROPLASTY, SHOULDER, TOTAL;  Surgeon: Osa Blase, MD;  Location: [facility];  Service:  Orthopedics;  Laterality: Left;   TUBAL LIGATION     UPPER ESOPHAGEAL ENDOSCOPIC ULTRASOUND (EUS) N/A 06/11/2019   Procedure: UPPER ESOPHAGEAL ENDOSCOPIC ULTRASOUND (EUS);  Surgeon: Alvis Jourdain, MD;  Location: [facility];  Service: Endoscopy;  Laterality: N/A;   Patient Active Problem List   Diagnosis Date Noted   S/P reverse total shoulder arthroplasty, left 11/04/2023   Vertigo 05/28/2023   Palpitations 12/05/2022   Transaminitis 11/16/2022   Hip fracture (HCC) 11/15/2022   GERD (gastroesophageal reflux disease) 11/15/2022   Tobacco use 11/15/2022   Idiopathic hypotension 07/19/2022   Slurred speech 07/19/2022   Underweight 07/19/2022   Hypotension 07/19/2022   Seizure disorder (HCC) 07/18/2022   Chronic migraine w/o aura w/o status migrainosus, not intractable 07/18/2022   Cerebral cavernoma 07/18/2022   Acute GI bleed due to bleeding pyloric/gastric ulcer 05/15/2019   PUD (peptic ulcer disease)-EGD 05/15/2019 with bleeding pyloric/gastric ulcer, nonbleeding duodenal ulcers and gastritis    Hematemesis with nausea    Migraine headache 09/25/2015   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   Bilateral occipital neuralgia 01/02/2015   Migraine 01/02/2015   Chest pain of uncertain etiology 11/29/2014   DDD (degenerative disc disease), cervical 10/27/2014   DDD (degenerative disc disease), 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   DDD (degenerative disc disease), lumbosacral 10/27/2014   Hypokalemia 05/07/2013   Partial tear subscapularis tendon 12/27/2011    PCP: Dr. Kathyleen Parkins REFERRING PROVIDER: Dr. Osa Blase  ONSET DATE: 11/04/23  REFERRING DIAG: s/p left reverse TSA  THERAPY DIAG:  Acute pain of left shoulder  Stiffness of left shoulder, not elsewhere classified  Other symptoms and signs involving the musculoskeletal system  Rationale for Evaluation and Treatment: Rehabilitation  SUBJECTIVE:   SUBJECTIVE STATEMENT: S: "It was sore after I left the other  day."   PRECAUTIONS: Shoulder-see protocol  WEIGHT BEARING RESTRICTIONS: Yes NWB  PAIN:  Are you having pain? Yes: NPRS scale: 5/10 Pain location: left shoulder Pain description: constant ache Aggravating factors: movement Relieving factors: ice  FALLS: Has patient fallen in last 6 months? No  PLOF: Independent  PATIENT GOALS: To have less pain and more mobility.  NEXT MD VISIT: 12/19/23  OBJECTIVE:   HAND DOMINANCE: Right  ADLs: Overall ADLs: Pt is unable to use the LUE for any ADLs at this time. Pt reports difficulty with sleeping, waking up a lot, sleeping in the recliner, tried the bed last night with a little better sleep. Pt is unable to reach overhead or behind back, cannot lift items.   FUNCTIONAL OUTCOME MEASURES: Upper Extremity Functional Scale (UEFS): 8/80=10%  UPPER EXTREMITY ROM:       Assessed in supine, er/IR adducted  Passive ROM Left eval  Shoulder flexion 61  Shoulder abduction 62  Shoulder internal rotation 90  Shoulder external rotation 0  (Blank rows = not tested)    UPPER EXTREMITY MMT:     Not assessed due to protocol & pain  MMT Left eval  Shoulder flexion   Shoulder abduction   Shoulder internal rotation   Shoulder  external rotation   (Blank rows = not tested)   SENSATION: Pt reports intermittent numbness in the elbow  OBSERVATIONS: pt with mod fascial restrictions along upper arm, anterior shoulder, trapezius and scapular regions   TODAY'S TREATMENT:                                                                                                                              DATE:   11/27/23 -Manual techniques: to upper arm, pectoralis region, and trapezius; mod fascial restrictions- manual completed to reduce fascial restrictions, decrease pain, and improve ROM  -P/ROM: supine, shoulder flexion, abduction, internal/external rotation 10 reps -Scapular A/ROM: sitting- elevation, depression, protraction, retraction 10  reps -Anterior glide, caudle glide, 3x10" holds -Therapy ball rolls: flexion, abduction, 10 reps -Pulleys: flexion, 1'   11/25/23 -Manual techniques: to upper arm, pectoralis region, and trapezius; mod fascial restrictions- manual completed to reduce fascial restrictions, decrease pain, and improve ROM  -P/ROM: supine, shoulder flexion, abduction, internal/external rotation 10 reps -Scapular A/ROM: sitting, elevation, depression, protraction, retraction 10 reps -Pendulums: side to side, forward and backward, circles, 1' each -Therapy ball rolls: flexion, abduction, 10 reps  11/19/23 -Manual techniques: to upper arm, pectoralis region, and trapezius; mod fascial restrictions- manual completed to reduce fascial restrictions, decrease pain, and improve ROM  -P/ROM: supine, shoulder flexion, abduction, internal/external rotation 10 reps -Scapular A/ROM: sitting, elevation, depression, protraction, retraction 10 reps -Pendulums: side to side, forward and backward, circles, 1' each -Therapy ball rolls: flexion, 10 reps    PATIENT EDUCATION: Education details: reviewed HEP Person educated: Patient Education method: Explanation, Demonstration, and Handouts Education comprehension: verbalized understanding and returned demonstration  HOME EXERCISE PROGRAM: Eval: pendulums, table slides (no abduction) wrist A/ROM, elbow self-ROM  GOALS: Goals reviewed with patient? Yes   SHORT TERM GOALS: Target date: 12/12/23  Pt will be provided with and educated on HEP to improve mobility in LUE required for use during ADL completion.   Goal status: IN PROGRESS  2.  Pt will increase LUE P/ROM by 50+ degrees to improve ability to use LUE during dressing tasks with minimal compensatory techniques.   Goal status: IN PROGRESS  3.  Pt will increase LUE strength to 3+/5 to improve ability to reach for items at waist to chest height during bathing and grooming tasks.   Goal status: IN PROGRESS   LONG  TERM GOALS: Target date: 01/11/24  Pt will decrease pain in LUE to 3/10 or less to improve ability to sleep for 2+ consecutive hours without waking due to pain.   Goal status: IN PROGRESS  2.  Pt will decrease LUE fascial restrictions to min amounts or less to improve mobility required for functional reaching tasks.   Goal status: IN PROGRESS  3.  Pt will increase LUE A/ROM by 75+ degrees to improve ability to use LUE when reaching overhead or behind back during dressing and bathing tasks.   Goal status: IN PROGRESS  4.  Pt will  increase LUE strength to 4+/5 or greater to improve ability to use LUE when lifting or carrying items during meal preparation/housework/yardwork tasks.   Goal status: IN PROGRESS  5.  Pt will return to highest level of function using LUE as non-dominant during functional task completion.   Goal status: IN PROGRESS   ASSESSMENT:  CLINICAL IMPRESSION: Patient reports she has been trying to sleep without the sling, min improvement. Pt reports she did take pain medication prior to session. OT noting significant improvement in tolerance of passive stretching and exercises. Pt tolerating passive stretch to 90 degrees for flexion and abduction, ~20 degrees for er. Pt with improved scapular mobility and was able to complete ball rolls in abduction today. Added pulleys for flexion with pt achieving 90 degrees. Verbal cuing for form and technique.     PERFORMANCE DEFICITS: in functional skills including in functional skills including ADLs, IADLs, coordination, tone, ROM, strength, pain, fascial restrictions, muscle spasms, and UE functional use     PLAN:  OT FREQUENCY: 2x/week  OT DURATION: 8 weeks  PLANNED INTERVENTIONS: 97168 OT Re-evaluation, 97535 self care/ADL training, 86578 therapeutic exercise, 97530 therapeutic activity, 97112 neuromuscular re-education, 97140 manual therapy, 97014 electrical stimulation unattended, patient/family education, and DME  and/or AE instructions  CONSULTED AND AGREED WITH PLAN OF CARE: Patient  PLAN FOR NEXT SESSION: manual techniques, scapular A/ROM, P/ROM    Lafonda Piety, OTR/L  [phone] 11/27/2023, 10:20 AM

## 2023-12-02 ENCOUNTER — Ambulatory Visit (HOSPITAL_COMMUNITY): Admitting: Occupational Therapy

## 2023-12-02 ENCOUNTER — Encounter (HOSPITAL_COMMUNITY): Payer: Self-pay | Admitting: Occupational Therapy

## 2023-12-02 DIAGNOSIS — M25612 Stiffness of left shoulder, not elsewhere classified: Secondary | ICD-10-CM

## 2023-12-02 DIAGNOSIS — M25512 Pain in left shoulder: Secondary | ICD-10-CM

## 2023-12-02 DIAGNOSIS — R29898 Other symptoms and signs involving the musculoskeletal system: Secondary | ICD-10-CM

## 2023-12-02 NOTE — Therapy (Signed)
 OUTPATIENT OCCUPATIONAL THERAPY ORTHO TREATMENT  [name] MRN: [id number] DOB:[birth year], 53 y.o., female Today's Date: 12/02/2023   END OF SESSION:  OT End of Session - 12/02/23 1000     Visit Number 6    Number of Visits 16    Date for OT Re-Evaluation 01/11/24   progress note 12/12/23   Authorization Type 1) UHC Dual Complete Medicare 2) [place] Medicaid    Authorization Time Period no auth required    Progress Note Due on Visit 10    OT Start Time [phone]    OT Stop Time 1014    OT Time Calculation (min) 40 min    Activity Tolerance Patient tolerated treatment well    Behavior During Therapy WFL for tasks assessed/performed                  Past Medical History:  Diagnosis Date   Asthma    seasonal   Bruising, spontaneous 12/08/2013   Cancer (HCC)    cervical cancer - had hysterectomy   Chronic back pain    Chronic back pain    DDD (degenerative disc disease), lumbar    DDD (degenerative disc disease), lumbar    Dysrhythmia    tachycardia   Fibromyalgia    Headache(784.0)    migraines   History of kidney stones    Ovarian cyst    Partial tear subscapularis tendon 12/27/2011   Scoliosis    Seizures (HCC)    started 9/12-   Past Surgical History:  Procedure Laterality Date   ABDOMINAL HYSTERECTOMY  2012   BACK SURGERY  1989   scoliosis throsic-rods   CESAREAN SECTION     CHOLECYSTECTOMY N/A 11/17/2012   Procedure: LAPAROSCOPIC CHOLECYSTECTOMY WITH INTRAOPERATIVE CHOLANGIOGRAM;  Surgeon: Shela Derby, MD;  Location: [facility];  Service: General;  Laterality: N/A;   COLONOSCOPY WITH PROPOFOL  N/A 04/21/2020   Procedure: COLONOSCOPY WITH PROPOFOL ;  Surgeon: Alvis Jourdain, MD;  Location: [facility];  Service: Endoscopy;  Laterality: N/A;   ENDOSCOPIC RETROGRADE CHOLANGIOPANCREATOGRAPHY (ERCP) WITH PROPOFOL  N/A 06/11/2019   Procedure: ENDOSCOPIC RETROGRADE CHOLANGIOPANCREATOGRAPHY (ERCP) WITH PROPOFOL ;  Surgeon: Alvis Jourdain, MD;   Location: [facility];  Service: Endoscopy;  Laterality: N/A;   ERCP N/A 05/07/2013   Procedure: ENDOSCOPIC RETROGRADE CHOLANGIOPANCREATOGRAPHY (ERCP);  Surgeon: Almeda Aris, MD;  Location: [facility];  Service: Endoscopy;  Laterality: N/A;  start ercp first, if canulation fails switch to EUS    ESOPHAGOGASTRODUODENOSCOPY (EGD) WITH PROPOFOL  N/A 05/15/2019   Procedure: ESOPHAGOGASTRODUODENOSCOPY (EGD) WITH PROPOFOL ;  Surgeon: Alyce Jubilee, MD;  Location: [facility];  Service: Endoscopy;  Laterality: N/A;   ESOPHAGOGASTRODUODENOSCOPY (EGD) WITH PROPOFOL  N/A 03/03/2020   Procedure: ESOPHAGOGASTRODUODENOSCOPY (EGD) WITH PROPOFOL ;  Surgeon: Alvis Jourdain, MD;  Location: [facility];  Service: Endoscopy;  Laterality: N/A;   ESOPHAGOGASTRODUODENOSCOPY (EGD) WITH PROPOFOL  N/A 04/21/2020   Procedure: ESOPHAGOGASTRODUODENOSCOPY (EGD) WITH PROPOFOL ;  Surgeon: Alvis Jourdain, MD;  Location: [facility];  Service: Endoscopy;  Laterality: N/A;   EUS N/A 11/03/2012   Procedure: UPPER ENDOSCOPIC ULTRASOUND (EUS) LINEAR;  Surgeon: Almeda Aris, MD;  Location: [facility];  Service: Endoscopy;  Laterality: N/A;   left arm     NECK SURGERY     POLYPECTOMY  04/21/2020   Procedure: POLYPECTOMY;  Surgeon: Alvis Jourdain, MD;  Location: [facility];  Service: Endoscopy;;   REMOVAL OF STONES  06/11/2019   Procedure: REMOVAL OF STONES;  Surgeon: Alvis Jourdain, MD;  Location: [facility];  Service: Endoscopy;;   TOOTH  EXTRACTION N/A 05/24/2022   Procedure: DENTAL RESTORATION/EXTRACTIONS;  Surgeon: Ascencion Lava, DMD;  Location: [facility];  Service: Oral Surgery;  Laterality: N/A;   TOTAL HIP ARTHROPLASTY Left 11/17/2022   Procedure: TOTAL HIP ARTHROPLASTY ANTERIOR APPROACH;  Surgeon: Saundra Curl, MD;  Location: [facility];  Service: Orthopedics;  Laterality: Left;   TOTAL SHOULDER ARTHROPLASTY Left 11/04/2023   Procedure: ARTHROPLASTY, SHOULDER, TOTAL;  Surgeon: Osa Blase, MD;  Location: [facility];  Service:  Orthopedics;  Laterality: Left;   TUBAL LIGATION     UPPER ESOPHAGEAL ENDOSCOPIC ULTRASOUND (EUS) N/A 06/11/2019   Procedure: UPPER ESOPHAGEAL ENDOSCOPIC ULTRASOUND (EUS);  Surgeon: Alvis Jourdain, MD;  Location: [facility];  Service: Endoscopy;  Laterality: N/A;   Patient Active Problem List   Diagnosis Date Noted   S/P reverse total shoulder arthroplasty, left 11/04/2023   Vertigo 05/28/2023   Palpitations 12/05/2022   Transaminitis 11/16/2022   Hip fracture (HCC) 11/15/2022   GERD (gastroesophageal reflux disease) 11/15/2022   Tobacco use 11/15/2022   Idiopathic hypotension 07/19/2022   Slurred speech 07/19/2022   Underweight 07/19/2022   Hypotension 07/19/2022   Seizure disorder (HCC) 07/18/2022   Chronic migraine w/o aura w/o status migrainosus, not intractable 07/18/2022   Cerebral cavernoma 07/18/2022   Acute GI bleed due to bleeding pyloric/gastric ulcer 05/15/2019   PUD (peptic ulcer disease)-EGD 05/15/2019 with bleeding pyloric/gastric ulcer, nonbleeding duodenal ulcers and gastritis    Hematemesis with nausea    Migraine headache 09/25/2015   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   Bilateral occipital neuralgia 01/02/2015   Migraine 01/02/2015   Chest pain of uncertain etiology 11/29/2014   DDD (degenerative disc disease), cervical 10/27/2014   DDD (degenerative disc disease), 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   DDD (degenerative disc disease), lumbosacral 10/27/2014   Hypokalemia 05/07/2013   Partial tear subscapularis tendon 12/27/2011    PCP: Dr. Kathyleen Parkins REFERRING PROVIDER: Dr. Osa Blase  ONSET DATE: 11/04/23  REFERRING DIAG: s/p left reverse TSA  THERAPY DIAG:  Acute pain of left shoulder  Stiffness of left shoulder, not elsewhere classified  Other symptoms and signs involving the musculoskeletal system  Rationale for Evaluation and Treatment: Rehabilitation  SUBJECTIVE:   SUBJECTIVE STATEMENT: S: "I used a pulley to the side and it  hurt."  PRECAUTIONS: Shoulder-see protocol  WEIGHT BEARING RESTRICTIONS: Yes NWB  PAIN:  Are you having pain? Yes: NPRS scale: 4/10 Pain location: left shoulder Pain description: constant ache Aggravating factors: movement Relieving factors: ice  FALLS: Has patient fallen in last 6 months? No  PLOF: Independent  PATIENT GOALS: To have less pain and more mobility.  NEXT MD VISIT: 12/19/23  OBJECTIVE:   HAND DOMINANCE: Right  ADLs: Overall ADLs: Pt is unable to use the LUE for any ADLs at this time. Pt reports difficulty with sleeping, waking up a lot, sleeping in the recliner, tried the bed last night with a little better sleep. Pt is unable to reach overhead or behind back, cannot lift items.   FUNCTIONAL OUTCOME MEASURES: Upper Extremity Functional Scale (UEFS): 8/80=10%  UPPER EXTREMITY ROM:       Assessed in supine, er/IR adducted  Passive ROM Left eval  Shoulder flexion 61  Shoulder abduction 62  Shoulder internal rotation 90  Shoulder external rotation 0  (Blank rows = not tested)    UPPER EXTREMITY MMT:     Not assessed due to protocol & pain  MMT Left eval  Shoulder flexion   Shoulder abduction   Shoulder internal rotation  Shoulder external rotation   (Blank rows = not tested)   SENSATION: Pt reports intermittent numbness in the elbow  OBSERVATIONS: pt with mod fascial restrictions along upper arm, anterior shoulder, trapezius and scapular regions   TODAY'S TREATMENT:                                                                                                                              DATE:   12/02/23 -Manual techniques: to upper arm, pectoralis region, and trapezius; mod fascial restrictions- manual completed to reduce fascial restrictions, decrease pain, and improve ROM  -P/ROM: supine, shoulder flexion, abduction, internal/external rotation 10 reps -AA/ROM: supine-protraction, flexion, er, horizontal abduction, 10 reps -Scapular  A/ROM: sitting- elevation, depression, protraction, retraction 10 reps -Pulleys: 1' flexion, 1' abduction  11/27/23 -Manual techniques: to upper arm, pectoralis region, and trapezius; mod fascial restrictions- manual completed to reduce fascial restrictions, decrease pain, and improve ROM  -P/ROM: supine, shoulder flexion, abduction, internal/external rotation 10 reps -Scapular A/ROM: sitting- elevation, depression, protraction, retraction 10 reps -Anterior glide, caudle glide, 3x10" holds -Therapy ball rolls: flexion, abduction, 10 reps -Pulleys: flexion, 1'   11/25/23 -Manual techniques: to upper arm, pectoralis region, and trapezius; mod fascial restrictions- manual completed to reduce fascial restrictions, decrease pain, and improve ROM  -P/ROM: supine, shoulder flexion, abduction, internal/external rotation 10 reps -Scapular A/ROM: sitting, elevation, depression, protraction, retraction 10 reps -Pendulums: side to side, forward and backward, circles, 1' each -Therapy ball rolls: flexion, abduction, 10 reps    PATIENT EDUCATION: Education details: reviewed HEP Person educated: Patient Education method: Programmer, multimedia, Demonstration, and Handouts Education comprehension: verbalized understanding and returned demonstration  HOME EXERCISE PROGRAM: Eval: pendulums, table slides (no abduction) wrist A/ROM, elbow self-ROM  GOALS: Goals reviewed with patient? Yes   SHORT TERM GOALS: Target date: 12/12/23  Pt will be provided with and educated on HEP to improve mobility in LUE required for use during ADL completion.   Goal status: IN PROGRESS  2.  Pt will increase LUE P/ROM by 50+ degrees to improve ability to use LUE during dressing tasks with minimal compensatory techniques.   Goal status: IN PROGRESS  3.  Pt will increase LUE strength to 3+/5 to improve ability to reach for items at waist to chest height during bathing and grooming tasks.   Goal status: IN PROGRESS   LONG TERM  GOALS: Target date: 01/11/24  Pt will decrease pain in LUE to 3/10 or less to improve ability to sleep for 2+ consecutive hours without waking due to pain.   Goal status: IN PROGRESS  2.  Pt will decrease LUE fascial restrictions to min amounts or less to improve mobility required for functional reaching tasks.   Goal status: IN PROGRESS  3.  Pt will increase LUE A/ROM by 75+ degrees to improve ability to use LUE when reaching overhead or behind back during dressing and bathing tasks.   Goal status: IN PROGRESS  4.  Pt will increase LUE  strength to 4+/5 or greater to improve ability to use LUE when lifting or carrying items during meal preparation/housework/yardwork tasks.   Goal status: IN PROGRESS  5.  Pt will return to highest level of function using LUE as non-dominant during functional task completion.   Goal status: IN PROGRESS   ASSESSMENT:  CLINICAL IMPRESSION: Pt reports her family built her a pulley system at home, she has been using for flexion, trialed abduction but then had a lot of pain and could not complete HEP for 2 days afterwards. Continued with manual techniques and passive stretching, pt able to tolerate ROM to approximately 65%. Progressed to AA/ROM per protocol, pt completing in supine and achieving ROM to 65%. No increased pain during passive stretching or AA/ROM. Completed pulleys in both flexion and abduction without increased pain today. Verbal cuing for form and technique throughout session.     PERFORMANCE DEFICITS: in functional skills including in functional skills including ADLs, IADLs, coordination, tone, ROM, strength, pain, fascial restrictions, muscle spasms, and UE functional use     PLAN:  OT FREQUENCY: 2x/week  OT DURATION: 8 weeks  PLANNED INTERVENTIONS: 97168 OT Re-evaluation, 97535 self care/ADL training, 16109 therapeutic exercise, 97530 therapeutic activity, 97112 neuromuscular re-education, 97140 manual therapy, 97014 electrical  stimulation unattended, patient/family education, and DME and/or AE instructions  CONSULTED AND AGREED WITH PLAN OF CARE: Patient  PLAN FOR NEXT SESSION: manual techniques, scapular A/ROM, P/ROM    Lafonda Piety, OTR/L  [phone] 12/02/2023, 11:13 AM

## 2023-12-04 ENCOUNTER — Encounter (HOSPITAL_COMMUNITY): Payer: Self-pay | Admitting: Occupational Therapy

## 2023-12-04 ENCOUNTER — Ambulatory Visit (HOSPITAL_COMMUNITY): Admitting: Occupational Therapy

## 2023-12-04 DIAGNOSIS — M25612 Stiffness of left shoulder, not elsewhere classified: Secondary | ICD-10-CM

## 2023-12-04 DIAGNOSIS — M25512 Pain in left shoulder: Secondary | ICD-10-CM | POA: Diagnosis not present

## 2023-12-04 DIAGNOSIS — R29898 Other symptoms and signs involving the musculoskeletal system: Secondary | ICD-10-CM

## 2023-12-04 NOTE — Therapy (Signed)
 OUTPATIENT OCCUPATIONAL THERAPY ORTHO TREATMENT  [name] MRN: [id number] DOB:Jun 29, 1970, 53 y.o., female Today's Date: 12/04/2023   END OF SESSION:  OT End of Session - 12/04/23 1014     Visit Number 7    Number of Visits 16    Date for OT Re-Evaluation 01/11/24   progress note 12/12/23   Authorization Type 1) UHC Dual Complete Medicare 2)  Medicaid    Authorization Time Period no auth required    Progress Note Due on Visit 10    OT Start Time 0933    OT Stop Time 1013    OT Time Calculation (min) 40 min    Activity Tolerance Patient tolerated treatment well    Behavior During Therapy WFL for tasks assessed/performed                Past Medical History:  Diagnosis Date   Asthma    seasonal   Bruising, spontaneous 12/08/2013   Cancer (HCC)    cervical cancer - had hysterectomy   Chronic back pain    Chronic back pain    DDD (degenerative disc disease), lumbar    DDD (degenerative disc disease), lumbar    Dysrhythmia    tachycardia   Fibromyalgia    Headache(784.0)    migraines   History of kidney stones    Ovarian cyst    Partial tear subscapularis tendon 12/27/2011   Scoliosis    Seizures (HCC)    started 9/12-   Past Surgical History:  Procedure Laterality Date   ABDOMINAL HYSTERECTOMY  2012   BACK SURGERY  1989   scoliosis throsic-rods   CESAREAN SECTION     CHOLECYSTECTOMY N/A 11/17/2012   Procedure: LAPAROSCOPIC CHOLECYSTECTOMY WITH INTRAOPERATIVE CHOLANGIOGRAM;  Surgeon: Shela Derby, MD;  Location: [facility];  Service: General;  Laterality: N/A;   COLONOSCOPY WITH PROPOFOL  N/A 04/21/2020   Procedure: COLONOSCOPY WITH PROPOFOL ;  Surgeon: Alvis Jourdain, MD;  Location: [facility];  Service: Endoscopy;  Laterality: N/A;   ENDOSCOPIC RETROGRADE CHOLANGIOPANCREATOGRAPHY (ERCP) WITH PROPOFOL  N/A 06/11/2019   Procedure: ENDOSCOPIC RETROGRADE CHOLANGIOPANCREATOGRAPHY (ERCP) WITH PROPOFOL ;  Surgeon: Alvis Jourdain, MD;  Location:  [facility];  Service: Endoscopy;  Laterality: N/A;   ERCP N/A 05/07/2013   Procedure: ENDOSCOPIC RETROGRADE CHOLANGIOPANCREATOGRAPHY (ERCP);  Surgeon: Almeda Aris, MD;  Location: [facility];  Service: Endoscopy;  Laterality: N/A;  start ercp first, if canulation fails switch to EUS    ESOPHAGOGASTRODUODENOSCOPY (EGD) WITH PROPOFOL  N/A 05/15/2019   Procedure: ESOPHAGOGASTRODUODENOSCOPY (EGD) WITH PROPOFOL ;  Surgeon: Alyce Jubilee, MD;  Location: [facility];  Service: Endoscopy;  Laterality: N/A;   ESOPHAGOGASTRODUODENOSCOPY (EGD) WITH PROPOFOL  N/A 03/03/2020   Procedure: ESOPHAGOGASTRODUODENOSCOPY (EGD) WITH PROPOFOL ;  Surgeon: Alvis Jourdain, MD;  [facility]: [facility];  Service: Endoscopy;  Laterality: N/A;   ESOPHAGOGASTRODUODENOSCOPY (EGD) WITH PROPOFOL  N/A 04/21/2020   Procedure: ESOPHAGOGASTRODUODENOSCOPY (EGD) WITH PROPOFOL ;  Surgeon: Alvis Jourdain, MD;  [facility];  Service: Endoscopy;  Laterality: N/A;   EUS N/A 11/03/2012   Procedure: UPPER ENDOSCOPIC ULTRASOUND (EUS) LINEAR;  Surgeon: Almeda Aris, MD;  [facility]: [facility];  Service: Endoscopy;  Laterality: N/A;   left arm     NECK SURGERY     POLYPECTOMY  04/21/2020   Procedure: POLYPECTOMY;  Surgeon: Alvis Jourdain, MD;  [facility]: [facility];  Service: Endoscopy;;   REMOVAL OF STONES  06/11/2019   Procedure: REMOVAL OF STONES;  Surgeon: Alvis Jourdain, MD;  [facility]: [facility];  Service: Endoscopy;;   TOOTH EXTRACTION N/A  05/24/2022   Procedure: DENTAL RESTORATION/EXTRACTIONS;  Surgeon: Ascencion Lava, DMD;  Location: [facility];  Service: Oral Surgery;  Laterality: N/A;   TOTAL HIP ARTHROPLASTY Left 11/17/2022   Procedure: TOTAL HIP ARTHROPLASTY ANTERIOR APPROACH;  Surgeon: Saundra Curl, MD;  Location: [facility];  Service: Orthopedics;  Laterality: Left;   TOTAL SHOULDER ARTHROPLASTY Left 11/04/2023   Procedure: ARTHROPLASTY, SHOULDER, TOTAL;  Surgeon: Osa Blase, MD;  Location: [facility];  Service:  Orthopedics;  Laterality: Left;   TUBAL LIGATION     UPPER ESOPHAGEAL ENDOSCOPIC ULTRASOUND (EUS) N/A 06/11/2019   Procedure: UPPER ESOPHAGEAL ENDOSCOPIC ULTRASOUND (EUS);  Surgeon: Alvis Jourdain, MD;  Location: [facility];  Service: Endoscopy;  Laterality: N/A;   Patient Active Problem List   Diagnosis Date Noted   S/P reverse total shoulder arthroplasty, left 11/04/2023   Vertigo 05/28/2023   Palpitations 12/05/2022   Transaminitis 11/16/2022   Hip fracture (HCC) 11/15/2022   GERD (gastroesophageal reflux disease) 11/15/2022   Tobacco use 11/15/2022   Idiopathic hypotension 07/19/2022   Slurred speech 07/19/2022   Underweight 07/19/2022   Hypotension 07/19/2022   Seizure disorder (HCC) 07/18/2022   Chronic migraine w/o aura w/o status migrainosus, not intractable 07/18/2022   Cerebral cavernoma 07/18/2022   Acute GI bleed due to bleeding pyloric/gastric ulcer 05/15/2019   PUD (peptic ulcer disease)-EGD 05/15/2019 with bleeding pyloric/gastric ulcer, nonbleeding duodenal ulcers and gastritis    Hematemesis with nausea    Migraine headache 09/25/2015   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   Bilateral occipital neuralgia 01/02/2015   Migraine 01/02/2015   Chest pain of uncertain etiology 11/29/2014   DDD (degenerative disc disease), cervical 10/27/2014   DDD (degenerative disc disease), 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   DDD (degenerative disc disease), lumbosacral 10/27/2014   Hypokalemia 05/07/2013   Partial tear subscapularis tendon 12/27/2011    PCP: Dr. Kathyleen Parkins REFERRING PROVIDER: Dr. Osa Blase  ONSET DATE: 11/04/23  REFERRING DIAG: s/p left reverse TSA  THERAPY DIAG:  Acute pain of left shoulder  Stiffness of left shoulder, not elsewhere classified  Other symptoms and signs involving the musculoskeletal system  Rationale for Evaluation and Treatment: Rehabilitation  SUBJECTIVE:   SUBJECTIVE STATEMENT: S: It's a little more sore  today.  PRECAUTIONS: Shoulder-see protocol  WEIGHT BEARING RESTRICTIONS: Yes NWB  PAIN:  Are you having pain? Yes: NPRS scale: 5/10 Pain location: left shoulder Pain description: constant ache Aggravating factors: movement Relieving factors: ice  FALLS: Has patient fallen in last 6 months? No  PLOF: Independent  PATIENT GOALS: To have less pain and more mobility.  NEXT MD VISIT: 12/19/23  OBJECTIVE:   HAND DOMINANCE: Right  ADLs: Overall ADLs: Pt is unable to use the LUE for any ADLs at this time. Pt reports difficulty with sleeping, waking up a lot, sleeping in the recliner, tried the bed last night with a little better sleep. Pt is unable to reach overhead or behind back, cannot lift items.   FUNCTIONAL OUTCOME MEASURES: Upper Extremity Functional Scale (UEFS): 8/80=10%  UPPER EXTREMITY ROM:       Assessed in supine, er/IR adducted  Passive ROM Left eval  Shoulder flexion 61  Shoulder abduction 62  Shoulder internal rotation 90  Shoulder external rotation 0  (Blank rows = not tested)    UPPER EXTREMITY MMT:     Not assessed due to protocol & pain  MMT Left eval  Shoulder flexion   Shoulder abduction   Shoulder internal rotation   Shoulder external rotation   (Blank  rows = not tested)   SENSATION: Pt reports intermittent numbness in the elbow  OBSERVATIONS: pt with mod fascial restrictions along upper arm, anterior shoulder, trapezius and scapular regions   TODAY'S TREATMENT:                                                                                                                              DATE:   12/04/23 -Manual techniques: to upper arm, pectoralis region, and trapezius; mod fascial restrictions- manual completed to reduce fascial restrictions, decrease pain, and improve ROM  -P/ROM: supine, shoulder flexion, abduction, internal/external rotation 10 reps -AA/ROM: supine-protraction, flexion, er, horizontal abduction, 10 reps -AA/ROM  sitting-protraction, flexion, er, 10 reps -Pulleys: 1' flexion, 1' abduction  12/02/23 -Manual techniques: to upper arm, pectoralis region, and trapezius; mod fascial restrictions- manual completed to reduce fascial restrictions, decrease pain, and improve ROM  -P/ROM: supine, shoulder flexion, abduction, internal/external rotation 10 reps -AA/ROM: supine-protraction, flexion, er, horizontal abduction, 10 reps -Scapular A/ROM: sitting- elevation, depression, protraction, retraction 10 reps -Pulleys: 1' flexion, 1' abduction  11/27/23 -Manual techniques: to upper arm, pectoralis region, and trapezius; mod fascial restrictions- manual completed to reduce fascial restrictions, decrease pain, and improve ROM  -P/ROM: supine, shoulder flexion, abduction, internal/external rotation 10 reps -Scapular A/ROM: sitting- elevation, depression, protraction, retraction 10 reps -Anterior glide, caudle glide, 3x10 holds -Therapy ball rolls: flexion, abduction, 10 reps -Pulleys: flexion, 1'      PATIENT EDUCATION: Education details: pulleys 3x/day, for 1-2' each Person educated: Patient Education method: Explanation, Demonstration, and Handouts Education comprehension: verbalized understanding and returned demonstration  HOME EXERCISE PROGRAM: Eval: pendulums, table slides (no abduction) wrist A/ROM, elbow self-ROM 12/04/23: pulleys 3x/day, for 1-2' each  GOALS: Goals reviewed with patient? Yes   SHORT TERM GOALS: Target date: 12/12/23  Pt will be provided with and educated on HEP to improve mobility in LUE required for use during ADL completion.   Goal status: IN PROGRESS  2.  Pt will increase LUE P/ROM by 50+ degrees to improve ability to use LUE during dressing tasks with minimal compensatory techniques.   Goal status: IN PROGRESS  3.  Pt will increase LUE strength to 3+/5 to improve ability to reach for items at waist to chest height during bathing and grooming tasks.   Goal status: IN  PROGRESS   LONG TERM GOALS: Target date: 01/11/24  Pt will decrease pain in LUE to 3/10 or less to improve ability to sleep for 2+ consecutive hours without waking due to pain.   Goal status: IN PROGRESS  2.  Pt will decrease LUE fascial restrictions to min amounts or less to improve mobility required for functional reaching tasks.   Goal status: IN PROGRESS  3.  Pt will increase LUE A/ROM by 75+ degrees to improve ability to use LUE when reaching overhead or behind back during dressing and bathing tasks.   Goal status: IN PROGRESS  4.  Pt will increase LUE strength to 4+/5  or greater to improve ability to use LUE when lifting or carrying items during meal preparation/housework/yardwork tasks.   Goal status: IN PROGRESS  5.  Pt will return to highest level of function using LUE as non-dominant during functional task completion.   Goal status: IN PROGRESS   ASSESSMENT:  CLINICAL IMPRESSION: Pt reports mildly increased soreness this morning, did take pain medication and muscle relaxer prior to session today. Continued with manual techniques and passive stretching, pt with ROM to approximately 60% passively. Continued with AA/ROM in supine, added a few planes of motion in sitting with ROM to approximately 65%. Educated on pulley reps/frequency at home. Focus on posture and form to promote appropriate scapular mobility required for functional reaching. Verbal cuing for form and technique. Pt asking if she is ready for weights yet, OT informing pt of protocol and need to wait on any active lifting or weight lifting at this time.      PERFORMANCE DEFICITS: in functional skills including in functional skills including ADLs, IADLs, coordination, tone, ROM, strength, pain, fascial restrictions, muscle spasms, and UE functional use     PLAN:  OT FREQUENCY: 2x/week  OT DURATION: 8 weeks  PLANNED INTERVENTIONS: 97168 OT Re-evaluation, 97535 self care/ADL training, 40981 therapeutic  exercise, 97530 therapeutic activity, 97112 neuromuscular re-education, 97140 manual therapy, 97014 electrical stimulation unattended, patient/family education, and DME and/or AE instructions  CONSULTED AND AGREED WITH PLAN OF CARE: Patient  PLAN FOR NEXT SESSION: manual techniques, scapular A/ROM, P/ROM    Lafonda Piety, OTR/L  [phone] 12/04/2023, 10:16 AM

## 2023-12-09 ENCOUNTER — Encounter (HOSPITAL_COMMUNITY): Admitting: Occupational Therapy

## 2023-12-11 ENCOUNTER — Ambulatory Visit (HOSPITAL_COMMUNITY): Admitting: Occupational Therapy

## 2023-12-11 ENCOUNTER — Encounter (HOSPITAL_COMMUNITY): Payer: Self-pay | Admitting: Occupational Therapy

## 2023-12-11 DIAGNOSIS — M25512 Pain in left shoulder: Secondary | ICD-10-CM | POA: Diagnosis not present

## 2023-12-11 DIAGNOSIS — R29898 Other symptoms and signs involving the musculoskeletal system: Secondary | ICD-10-CM

## 2023-12-11 DIAGNOSIS — M25612 Stiffness of left shoulder, not elsewhere classified: Secondary | ICD-10-CM

## 2023-12-11 NOTE — Patient Instructions (Signed)

## 2023-12-11 NOTE — Therapy (Signed)
 OUTPATIENT OCCUPATIONAL THERAPY ORTHO TREATMENT  [name] MRN: [id number] DOB:[birth year], 53 y.o., female Today's Date: 12/11/2023   END OF SESSION:  OT End of Session - 12/11/23 1322     Visit Number 8    Number of Visits 16    Date for OT Re-Evaluation 01/11/24    Authorization Type 1) UHC Dual Complete Medicare 2) [place] Medicaid    Authorization Time Period no auth required    Progress Note Due on Visit 10    OT Start Time 1300    OT Stop Time 1338    OT Time Calculation (min) 38 min    Activity Tolerance Patient tolerated treatment well    Behavior During Therapy WFL for tasks assessed/performed                 Past Medical History:  Diagnosis Date   Asthma    seasonal   Bruising, spontaneous 12/08/2013   Cancer (HCC)    cervical cancer - had hysterectomy   Chronic back pain    Chronic back pain    DDD (degenerative disc disease), lumbar    DDD (degenerative disc disease), lumbar    Dysrhythmia    tachycardia   Fibromyalgia    Headache(784.0)    migraines   History of kidney stones    Ovarian cyst    Partial tear subscapularis tendon 12/27/2011   Scoliosis    Seizures (HCC)    started 9/12-   Past Surgical History:  Procedure Laterality Date   ABDOMINAL HYSTERECTOMY  2012   BACK SURGERY  1989   scoliosis throsic-rods   CESAREAN SECTION     CHOLECYSTECTOMY N/A 11/17/2012   Procedure: LAPAROSCOPIC CHOLECYSTECTOMY WITH INTRAOPERATIVE CHOLANGIOGRAM;  Surgeon: Shela Derby, MD;  Location: [facility];  Service: General;  Laterality: N/A;   COLONOSCOPY WITH PROPOFOL  N/A 04/21/2020   Procedure: COLONOSCOPY WITH PROPOFOL ;  Surgeon: Alvis Jourdain, MD;  Location: [facility];  Service: Endoscopy;  Laterality: N/A;   ENDOSCOPIC RETROGRADE CHOLANGIOPANCREATOGRAPHY (ERCP) WITH PROPOFOL  N/A 06/11/2019   Procedure: ENDOSCOPIC RETROGRADE CHOLANGIOPANCREATOGRAPHY (ERCP) WITH PROPOFOL ;  Surgeon: Alvis Jourdain, MD;  Location: [facility];   Service: Endoscopy;  Laterality: N/A;   ERCP N/A 05/07/2013   Procedure: ENDOSCOPIC RETROGRADE CHOLANGIOPANCREATOGRAPHY (ERCP);  Surgeon: Almeda Aris, MD;  Location: [facility];  Service: Endoscopy;  Laterality: N/A;  start ercp first, if canulation fails switch to EUS    ESOPHAGOGASTRODUODENOSCOPY (EGD) WITH PROPOFOL  N/A 05/15/2019   Procedure: ESOPHAGOGASTRODUODENOSCOPY (EGD) WITH PROPOFOL ;  Surgeon: Alyce Jubilee, MD;  Location: [facility];  Service: Endoscopy;  Laterality: N/A;   ESOPHAGOGASTRODUODENOSCOPY (EGD) WITH PROPOFOL  N/A 03/03/2020   Procedure: ESOPHAGOGASTRODUODENOSCOPY (EGD) WITH PROPOFOL ;  Surgeon: Alvis Jourdain, MD;  Location: [facility];  Service: Endoscopy;  Laterality: N/A;   ESOPHAGOGASTRODUODENOSCOPY (EGD) WITH PROPOFOL  N/A 04/21/2020   Procedure: ESOPHAGOGASTRODUODENOSCOPY (EGD) WITH PROPOFOL ;  Surgeon: Alvis Jourdain, MD;  Location: [facility];  Service: Endoscopy;  Laterality: N/A;   EUS N/A 11/03/2012   Procedure: UPPER ENDOSCOPIC ULTRASOUND (EUS) LINEAR;  Surgeon: Almeda Aris, MD;  Location: [facility];  Service: Endoscopy;  Laterality: N/A;   left arm     NECK SURGERY     POLYPECTOMY  04/21/2020   Procedure: POLYPECTOMY;  Surgeon: Alvis Jourdain, MD;  Location: [facility];  Service: Endoscopy;;   REMOVAL OF STONES  06/11/2019   Procedure: REMOVAL OF STONES;  Surgeon: Alvis Jourdain, MD;  Location: [facility];  Service: Endoscopy;;   TOOTH EXTRACTION N/A 05/24/2022  Procedure: DENTAL RESTORATION/EXTRACTIONS;  Surgeon: Ascencion Lava, DMD;  Location: [facility];  Service: Oral Surgery;  Laterality: N/A;   TOTAL HIP ARTHROPLASTY Left 11/17/2022   Procedure: TOTAL HIP ARTHROPLASTY ANTERIOR APPROACH;  Surgeon: Saundra Curl, MD;  Location: [facility];  Service: Orthopedics;  Laterality: Left;   TOTAL SHOULDER ARTHROPLASTY Left 11/04/2023   Procedure: ARTHROPLASTY, SHOULDER, TOTAL;  Surgeon: Osa Blase, MD;  Location: [facility];  Service: Orthopedics;   Laterality: Left;   TUBAL LIGATION     UPPER ESOPHAGEAL ENDOSCOPIC ULTRASOUND (EUS) N/A 06/11/2019   Procedure: UPPER ESOPHAGEAL ENDOSCOPIC ULTRASOUND (EUS);  Surgeon: Alvis Jourdain, MD;  Location: [facility];  Service: Endoscopy;  Laterality: N/A;   Patient Active Problem List   Diagnosis Date Noted   S/P reverse total shoulder arthroplasty, left 11/04/2023   Vertigo 05/28/2023   Palpitations 12/05/2022   Transaminitis 11/16/2022   Hip fracture (HCC) 11/15/2022   GERD (gastroesophageal reflux disease) 11/15/2022   Tobacco use 11/15/2022   Idiopathic hypotension 07/19/2022   Slurred speech 07/19/2022   Underweight 07/19/2022   Hypotension 07/19/2022   Seizure disorder (HCC) 07/18/2022   Chronic migraine w/o aura w/o status migrainosus, not intractable 07/18/2022   Cerebral cavernoma 07/18/2022   Acute GI bleed due to bleeding pyloric/gastric ulcer 05/15/2019   PUD (peptic ulcer disease)-EGD 05/15/2019 with bleeding pyloric/gastric ulcer, nonbleeding duodenal ulcers and gastritis    Hematemesis with nausea    Migraine headache 09/25/2015   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   Bilateral occipital neuralgia 01/02/2015   Migraine 01/02/2015   Chest pain of uncertain etiology 11/29/2014   DDD (degenerative disc disease), cervical 10/27/2014   DDD (degenerative disc disease), 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   DDD (degenerative disc disease), lumbosacral 10/27/2014   Hypokalemia 05/07/2013   Partial tear subscapularis tendon 12/27/2011    PCP: Dr. Kathyleen Parkins REFERRING PROVIDER: Dr. Osa Blase  ONSET DATE: 11/04/23  REFERRING DIAG: s/p left reverse TSA  THERAPY DIAG:  Acute pain of left shoulder  Stiffness of left shoulder, not elsewhere classified  Other symptoms and signs involving the musculoskeletal system  Rationale for Evaluation and Treatment: Rehabilitation  SUBJECTIVE:   SUBJECTIVE STATEMENT: S: I'm only wearing the sling in public.    PRECAUTIONS: Shoulder-see protocol  WEIGHT BEARING RESTRICTIONS: Yes NWB  PAIN:  Are you having pain? Yes: NPRS scale: 2/10 Pain location: left shoulder Pain description: constant ache Aggravating factors: movement Relieving factors: ice  FALLS: Has patient fallen in last 6 months? No  PLOF: Independent  PATIENT GOALS: To have less pain and more mobility.  NEXT MD VISIT: 12/19/23  OBJECTIVE:   HAND DOMINANCE: Right  ADLs: Overall ADLs: Pt is unable to use the LUE for any ADLs at this time. Pt reports difficulty with sleeping, waking up a lot, sleeping in the recliner, tried the bed last night with a little better sleep. Pt is unable to reach overhead or behind back, cannot lift items.   FUNCTIONAL OUTCOME MEASURES: Upper Extremity Functional Scale (UEFS): 8/80=10%  UPPER EXTREMITY ROM:       Assessed in supine, er/IR adducted  Passive ROM Left eval Left 12/11/23  Shoulder flexion 61 129  Shoulder abduction 62 131  Shoulder internal rotation 90 90  Shoulder external rotation 0 50  (Blank rows = not tested)    UPPER EXTREMITY MMT:     Not assessed due to protocol & pain  MMT Left eval  Shoulder flexion   Shoulder abduction   Shoulder internal rotation   Shoulder  external rotation   (Blank rows = not tested)   SENSATION: Pt reports intermittent numbness in the elbow  OBSERVATIONS: pt with mod fascial restrictions along upper arm, anterior shoulder, trapezius and scapular regions   TODAY'S TREATMENT:                                                                                                                              DATE:   12/11/23 -Manual techniques: to upper arm, pectoralis region, and trapezius; mod fascial restrictions- manual completed to reduce fascial restrictions, decrease pain, and improve ROM  -P/ROM: supine, shoulder flexion, abduction, internal/external rotation, abduction, 10 reps -AA/ROM: supine-protraction, flexion, er,  horizontal abduction, abduction, 10 reps -Thumb tacks: 1' low level -[place]: 1' low level  12/04/23 -Manual techniques: to upper arm, pectoralis region, and trapezius; mod fascial restrictions- manual completed to reduce fascial restrictions, decrease pain, and improve ROM  -P/ROM: supine, shoulder flexion, abduction, internal/external rotation 10 reps -AA/ROM: supine-protraction, flexion, er, horizontal abduction, 10 reps -AA/ROM sitting-protraction, flexion, er, 10 reps -Pulleys: 1' flexion, 1' abduction  12/02/23 -Manual techniques: to upper arm, pectoralis region, and trapezius; mod fascial restrictions- manual completed to reduce fascial restrictions, decrease pain, and improve ROM  -P/ROM: supine, shoulder flexion, abduction, internal/external rotation 10 reps -AA/ROM: supine-protraction, flexion, er, horizontal abduction, 10 reps -Scapular A/ROM: sitting- elevation, depression, protraction, retraction 10 reps -Pulleys: 1' flexion, 1' abduction      PATIENT EDUCATION: Education details: AA/ROM Person educated: Patient Education method: Programmer, multimedia, Demonstration, and Handouts Education comprehension: verbalized understanding and returned demonstration  HOME EXERCISE PROGRAM: Eval: pendulums, table slides (no abduction) wrist A/ROM, elbow self-ROM 12/04/23: pulleys 3x/day, for 1-2' each 12/11/23: AA/ROM  GOALS: Goals reviewed with patient? Yes   SHORT TERM GOALS: Target date: 12/12/23  Pt will be provided with and educated on HEP to improve mobility in LUE required for use during ADL completion.   Goal status: IN PROGRESS  2.  Pt will increase LUE P/ROM by 50+ degrees to improve ability to use LUE during dressing tasks with minimal compensatory techniques.   Goal status: MET  3.  Pt will increase LUE strength to 3+/5 to improve ability to reach for items at waist to chest height during bathing and grooming tasks.   Goal status: IN PROGRESS   LONG TERM GOALS:  Target date: 01/11/24  Pt will decrease pain in LUE to 3/10 or less to improve ability to sleep for 2+ consecutive hours without waking due to pain.   Goal status: IN PROGRESS  2.  Pt will decrease LUE fascial restrictions to min amounts or less to improve mobility required for functional reaching tasks.   Goal status: IN PROGRESS  3.  Pt will increase LUE A/ROM by 75+ degrees to improve ability to use LUE when reaching overhead or behind back during dressing and bathing tasks.   Goal status: IN PROGRESS  4.  Pt will increase LUE strength to 4+/5 or greater to  improve ability to use LUE when lifting or carrying items during meal preparation/housework/yardwork tasks.   Goal status: IN PROGRESS  5.  Pt will return to highest level of function using LUE as non-dominant during functional task completion.   Goal status: IN PROGRESS   ASSESSMENT:  CLINICAL IMPRESSION: Pt reports she has been doing well, HEP is going well. Measurements taken for progress note today, pt is making great progress towards goals and has met her STG for P/ROM. Added AA/ROM in supine and standing, pt completing with verbal cuing for form and technique. Updated HEP for AA/ROM today. Also adding low level AA/ROM tasks at door. Mod fatigue at end of session.     PERFORMANCE DEFICITS: in functional skills including in functional skills including ADLs, IADLs, coordination, tone, ROM, strength, pain, fascial restrictions, muscle spasms, and UE functional use     PLAN:  OT FREQUENCY: 2x/week  OT DURATION: 8 weeks  PLANNED INTERVENTIONS: 97168 OT Re-evaluation, 97535 self care/ADL training, 18841 therapeutic exercise, 97530 therapeutic activity, 97112 neuromuscular re-education, 97140 manual therapy, 97014 electrical stimulation unattended, patient/family education, and DME and/or AE instructions  CONSULTED AND AGREED WITH PLAN OF CARE: Patient  PLAN FOR NEXT SESSION: manual techniques, scapular A/ROM, P/ROM,  AA/ROM    Lafonda Piety, OTR/L  [phone] 12/11/2023, 1:39 PM

## 2023-12-16 ENCOUNTER — Encounter (HOSPITAL_COMMUNITY): Admitting: Occupational Therapy

## 2023-12-18 ENCOUNTER — Encounter (HOSPITAL_COMMUNITY): Admitting: Occupational Therapy

## 2023-12-18 ENCOUNTER — Encounter (HOSPITAL_COMMUNITY): Payer: Self-pay | Admitting: Occupational Therapy

## 2023-12-18 ENCOUNTER — Ambulatory Visit (HOSPITAL_COMMUNITY): Admitting: Occupational Therapy

## 2023-12-18 DIAGNOSIS — R29898 Other symptoms and signs involving the musculoskeletal system: Secondary | ICD-10-CM

## 2023-12-18 DIAGNOSIS — M25512 Pain in left shoulder: Secondary | ICD-10-CM

## 2023-12-18 DIAGNOSIS — M25612 Stiffness of left shoulder, not elsewhere classified: Secondary | ICD-10-CM

## 2023-12-18 NOTE — Therapy (Signed)
 OUTPATIENT OCCUPATIONAL THERAPY ORTHO TREATMENT  [name] MRN: [id number] DOB:1970-10-27, 53 y.o., female Today's Date: 12/18/2023   END OF SESSION:  OT End of Session - 12/18/23 1342     Visit Number 9    Number of Visits 16    Date for OT Re-Evaluation 01/11/24    Authorization Type 1) UHC Dual Complete Medicare 2) [place] Medicaid    Authorization Time Period no auth required    Progress Note Due on Visit 10    OT Start Time 1304    OT Stop Time 1342    OT Time Calculation (min) 38 min    Activity Tolerance Patient tolerated treatment well    Behavior During Therapy WFL for tasks assessed/performed                  Past Medical History:  Diagnosis Date   Asthma    seasonal   Bruising, spontaneous 12/08/2013   Cancer (HCC)    cervical cancer - had hysterectomy   Chronic back pain    Chronic back pain    DDD (degenerative disc disease), lumbar    DDD (degenerative disc disease), lumbar    Dysrhythmia    tachycardia   Fibromyalgia    Headache(784.0)    migraines   History of kidney stones    Ovarian cyst    Partial tear subscapularis tendon 12/27/2011   Scoliosis    Seizures (HCC)    started 9/12-   Past Surgical History:  Procedure Laterality Date   ABDOMINAL HYSTERECTOMY  2012   BACK SURGERY  1989   scoliosis throsic-rods   CESAREAN SECTION     CHOLECYSTECTOMY N/A 11/17/2012   Procedure: LAPAROSCOPIC CHOLECYSTECTOMY WITH INTRAOPERATIVE CHOLANGIOGRAM;  Surgeon: Lynda Leos, MD;  Location: [facility];  Service: General;  Laterality: N/A;   COLONOSCOPY WITH PROPOFOL  N/A 04/21/2020   Procedure: COLONOSCOPY WITH PROPOFOL ;  Surgeon: Rollin Dover, MD;  [facility]: [facility];  Service: Endoscopy;  Laterality: N/A;   ENDOSCOPIC RETROGRADE CHOLANGIOPANCREATOGRAPHY (ERCP) WITH PROPOFOL  N/A 06/11/2019   Procedure: ENDOSCOPIC RETROGRADE CHOLANGIOPANCREATOGRAPHY (ERCP) WITH PROPOFOL ;  Surgeon: Rollin Dover, MD;  [facility];   Service: Endoscopy;  Laterality: N/A;   ERCP N/A 05/07/2013   Procedure: ENDOSCOPIC RETROGRADE CHOLANGIOPANCREATOGRAPHY (ERCP);  Surgeon: Dover JONETTA Rollin, MD;  Location: [facility];  Service: Endoscopy;  Laterality: N/A;  start ercp first, if canulation fails switch to EUS    ESOPHAGOGASTRODUODENOSCOPY (EGD) WITH PROPOFOL  N/A 05/15/2019   Procedure: ESOPHAGOGASTRODUODENOSCOPY (EGD) WITH PROPOFOL ;  Surgeon: Harvey Margo CROME, MD;  [facility];  Service: Endoscopy;  Laterality: N/A;   ESOPHAGOGASTRODUODENOSCOPY (EGD) WITH PROPOFOL  N/A 03/03/2020   Procedure: ESOPHAGOGASTRODUODENOSCOPY (EGD) WITH PROPOFOL ;  Surgeon: Rollin Dover, MD;  [facility];  Service: Endoscopy;  Laterality: N/A;   ESOPHAGOGASTRODUODENOSCOPY (EGD) WITH PROPOFOL  N/A 04/21/2020   Procedure: ESOPHAGOGASTRODUODENOSCOPY (EGD) WITH PROPOFOL ;  Surgeon: Rollin Dover, MD;  [facility];  Service: Endoscopy;  Laterality: N/A;   EUS N/A 11/03/2012   Procedure: UPPER ENDOSCOPIC ULTRASOUND (EUS) LINEAR;  Surgeon: Dover JONETTA Rollin, MD;  [facility];  Service: Endoscopy;  Laterality: N/A;   left arm     NECK SURGERY     POLYPECTOMY  04/21/2020   Procedure: POLYPECTOMY;  Surgeon: Rollin Dover, MD;  [facility];  Service: Endoscopy;;   REMOVAL OF STONES  06/11/2019   Procedure: REMOVAL OF STONES;  Surgeon: Rollin Dover, MD;  [facility];  Service: Endoscopy;;   TOOTH EXTRACTION N/A 05/24/2022  Procedure: DENTAL RESTORATION/EXTRACTIONS;  Surgeon: Sheryle Hamilton, DMD;  Location: [facility];  Service: Oral Surgery;  Laterality: N/A;   TOTAL HIP ARTHROPLASTY Left 11/17/2022   Procedure: TOTAL HIP ARTHROPLASTY ANTERIOR APPROACH;  Surgeon: Beverley Evalene BIRCH, MD;  Location: [facility];  Service: Orthopedics;  Laterality: Left;   TOTAL SHOULDER ARTHROPLASTY Left 11/04/2023   Procedure: ARTHROPLASTY, SHOULDER, TOTAL;  Surgeon: Josefina Chew, MD;  Location: [facility];  Service: Orthopedics;   Laterality: Left;   TUBAL LIGATION     UPPER ESOPHAGEAL ENDOSCOPIC ULTRASOUND (EUS) N/A 06/11/2019   Procedure: UPPER ESOPHAGEAL ENDOSCOPIC ULTRASOUND (EUS);  Surgeon: Rollin Dover, MD;  Location: [facility];  Service: Endoscopy;  Laterality: N/A;   Patient Active Problem List   Diagnosis Date Noted   S/P reverse total shoulder arthroplasty, left 11/04/2023   Vertigo 05/28/2023   Palpitations 12/05/2022   Transaminitis 11/16/2022   Hip fracture (HCC) 11/15/2022   GERD (gastroesophageal reflux disease) 11/15/2022   Tobacco use 11/15/2022   Idiopathic hypotension 07/19/2022   Slurred speech 07/19/2022   Underweight 07/19/2022   Hypotension 07/19/2022   Seizure disorder (HCC) 07/18/2022   Chronic migraine w/o aura w/o status migrainosus, not intractable 07/18/2022   Cerebral cavernoma 07/18/2022   Acute GI bleed due to bleeding pyloric/gastric ulcer 05/15/2019   PUD (peptic ulcer disease)-EGD 05/15/2019 with bleeding pyloric/gastric ulcer, nonbleeding duodenal ulcers and gastritis    Hematemesis with nausea    Migraine headache 09/25/2015   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   Bilateral occipital neuralgia 01/02/2015   Migraine 01/02/2015   Chest pain of uncertain etiology 11/29/2014   DDD (degenerative disc disease), cervical 10/27/2014   DDD (degenerative disc disease), 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   DDD (degenerative disc disease), lumbosacral 10/27/2014   Hypokalemia 05/07/2013   Partial tear subscapularis tendon 12/27/2011    PCP: Dr. Jerilynn Carnes REFERRING PROVIDER: Dr. Chew Josefina  ONSET DATE: 11/04/23  REFERRING DIAG: s/p left reverse TSA  THERAPY DIAG:  Acute pain of left shoulder  Stiffness of left shoulder, not elsewhere classified  Other symptoms and signs involving the musculoskeletal system  Rationale for Evaluation and Treatment: Rehabilitation  SUBJECTIVE:   SUBJECTIVE STATEMENT: S: It's my first day out without my  sling.  PRECAUTIONS: Shoulder-see protocol  WEIGHT BEARING RESTRICTIONS: Yes NWB  PAIN:  Are you having pain? Yes: NPRS scale: 4/10 Pain location: left shoulder Pain description: constant ache Aggravating factors: movement Relieving factors: ice  FALLS: Has patient fallen in last 6 months? No  PLOF: Independent  PATIENT GOALS: To have less pain and more mobility.  NEXT MD VISIT: 12/18/23  OBJECTIVE:   HAND DOMINANCE: Right  ADLs: Overall ADLs: Pt is unable to use the LUE for any ADLs at this time. Pt reports difficulty with sleeping, waking up a lot, sleeping in the recliner, tried the bed last night with a little better sleep. Pt is unable to reach overhead or behind back, cannot lift items.   FUNCTIONAL OUTCOME MEASURES: Upper Extremity Functional Scale (UEFS): 8/80=10%  UPPER EXTREMITY ROM:       Assessed in supine, er/IR adducted  Passive ROM Left eval Left 12/11/23  Shoulder flexion 61 129  Shoulder abduction 62 131  Shoulder internal rotation 90 90  Shoulder external rotation 0 50  (Blank rows = not tested)    UPPER EXTREMITY MMT:     Not assessed due to protocol & pain  MMT Left eval  Shoulder flexion   Shoulder abduction   Shoulder internal rotation   Shoulder  external rotation   (Blank rows = not tested)   SENSATION: Pt reports intermittent numbness in the elbow  OBSERVATIONS: pt with mod fascial restrictions along upper arm, anterior shoulder, trapezius and scapular regions   TODAY'S TREATMENT:                                                                                                                              DATE:   12/18/23 -Manual techniques: to upper arm, pectoralis region, and trapezius; mod fascial restrictions- manual completed to reduce fascial restrictions, decrease pain, and improve ROM  -P/ROM: supine, shoulder flexion, abduction, internal/external rotation, abduction, 12 reps -AA/ROM: standing-protraction, flexion, er,  horizontal abduction, abduction, 10 reps -Wall wash: 1'  -Thumb tacks: shoulder height, 1' -Scapular A/ROM: extension, row, 10 reps -Pulleys: 1' flexion, 1' abduction  12/11/23 -Manual techniques: to upper arm, pectoralis region, and trapezius; mod fascial restrictions- manual completed to reduce fascial restrictions, decrease pain, and improve ROM  -P/ROM: supine, shoulder flexion, abduction, internal/external rotation, abduction, 10 reps -AA/ROM: supine-protraction, flexion, er, horizontal abduction, abduction, 10 reps -Thumb tacks: 1' low level -Wall Wash: 1' low level  12/04/23 -Manual techniques: to upper arm, pectoralis region, and trapezius; mod fascial restrictions- manual completed to reduce fascial restrictions, decrease pain, and improve ROM  -P/ROM: supine, shoulder flexion, abduction, internal/external rotation 10 reps -AA/ROM: supine-protraction, flexion, er, horizontal abduction, 10 reps -AA/ROM sitting-protraction, flexion, er, 10 reps -Pulleys: 1' flexion, 1' abduction      PATIENT EDUCATION: Education details: reviewed HEP Person educated: Patient Education method: Explanation, Demonstration, and Handouts Education comprehension: verbalized understanding and returned demonstration  HOME EXERCISE PROGRAM: Eval: pendulums, table slides (no abduction) wrist A/ROM, elbow self-ROM 12/04/23: pulleys 3x/day, for 1-2' each 12/11/23: AA/ROM  GOALS: Goals reviewed with patient? Yes   SHORT TERM GOALS: Target date: 12/12/23  Pt will be provided with and educated on HEP to improve mobility in LUE required for use during ADL completion.   Goal status: IN PROGRESS  2.  Pt will increase LUE P/ROM by 50+ degrees to improve ability to use LUE during dressing tasks with minimal compensatory techniques.   Goal status: MET  3.  Pt will increase LUE strength to 3+/5 to improve ability to reach for items at waist to chest height during bathing and grooming tasks.   Goal  status: IN PROGRESS   LONG TERM GOALS: Target date: 01/11/24  Pt will decrease pain in LUE to 3/10 or less to improve ability to sleep for 2+ consecutive hours without waking due to pain.   Goal status: IN PROGRESS  2.  Pt will decrease LUE fascial restrictions to min amounts or less to improve mobility required for functional reaching tasks.   Goal status: IN PROGRESS  3.  Pt will increase LUE A/ROM by 75+ degrees to improve ability to use LUE when reaching overhead or behind back during dressing and bathing tasks.   Goal status: IN PROGRESS  4.  Pt  will increase LUE strength to 4+/5 or greater to improve ability to use LUE when lifting or carrying items during meal preparation/housework/yardwork tasks.   Goal status: IN PROGRESS  5.  Pt will return to highest level of function using LUE as non-dominant during functional task completion.   Goal status: IN PROGRESS   ASSESSMENT:  CLINICAL IMPRESSION: Pt reports she has been trying to wear her sling less, feels tired though. Continued with manual techniques and AA/ROM in supine, pt with ROM to approximately 75% range.  Pt with mod fatigue during exercises, added AA/ROM in standing and progressed wall wash and thumb tacks to shoulder height. Verbal cuing for form and technique.     PERFORMANCE DEFICITS: in functional skills including in functional skills including ADLs, IADLs, coordination, tone, ROM, strength, pain, fascial restrictions, muscle spasms, and UE functional use     PLAN:  OT FREQUENCY: 2x/week  OT DURATION: 8 weeks  PLANNED INTERVENTIONS: 97168 OT Re-evaluation, 97535 self care/ADL training, 02889 therapeutic exercise, 97530 therapeutic activity, 97112 neuromuscular re-education, 97140 manual therapy, 97014 electrical stimulation unattended, patient/family education, and DME and/or AE instructions  CONSULTED AND AGREED WITH PLAN OF CARE: Patient  PLAN FOR NEXT SESSION: manual techniques, scapular A/ROM,  P/ROM, AA/ROM    Sonny Cory, OTR/L  [phone] 12/18/2023, 1:43 PM

## 2023-12-23 ENCOUNTER — Ambulatory Visit (HOSPITAL_COMMUNITY): Attending: Orthopedic Surgery | Admitting: Occupational Therapy

## 2023-12-23 ENCOUNTER — Encounter (HOSPITAL_COMMUNITY): Payer: Self-pay | Admitting: Occupational Therapy

## 2023-12-23 DIAGNOSIS — M25612 Stiffness of left shoulder, not elsewhere classified: Secondary | ICD-10-CM | POA: Insufficient documentation

## 2023-12-23 DIAGNOSIS — M25512 Pain in left shoulder: Secondary | ICD-10-CM | POA: Diagnosis present

## 2023-12-23 DIAGNOSIS — R29898 Other symptoms and signs involving the musculoskeletal system: Secondary | ICD-10-CM | POA: Diagnosis present

## 2023-12-23 NOTE — Therapy (Signed)
 OUTPATIENT OCCUPATIONAL THERAPY ORTHO TREATMENT  [name] MRN: [id number] DOB:05-31-1971, 53 y.o., female Today's Date: 12/23/2023   Progress Note Reporting Period 11/12/23 to 12/23/23  See note below for Objective Data and Assessment of Progress/Goals.      END OF SESSION:  OT End of Session - 12/23/23 1513     Visit Number 10    Number of Visits 16    Date for OT Re-Evaluation 01/11/24    Authorization Type 1) [employer]HC Dual Complete Medicare 2) [place] Medicaid    Authorization Time Period no auth required    Progress Note Due on Visit 20    OT Start Time 1433    OT Stop Time 1512    OT Time Calculation (min) 39 min    Activity Tolerance Patient tolerated treatment well    Behavior During Therapy WFL for tasks assessed/performed                   Past Medical History:  Diagnosis Date   Asthma    seasonal   Bruising, spontaneous 12/08/2013   Cancer (HCC)    cervical cancer - had hysterectomy   Chronic back pain    Chronic back pain    DDD (degenerative disc disease), lumbar    DDD (degenerative disc disease), lumbar    Dysrhythmia    tachycardia   Fibromyalgia    Headache(784.0)    migraines   History of kidney stones    Ovarian cyst    Partial tear subscapularis tendon 12/27/2011   Scoliosis    Seizures (HCC)    started 9/12-   Past Surgical History:  Procedure Laterality Date   ABDOMINAL HYSTERECTOMY  2012   BACK SURGERY  1989   scoliosis throsic-rods   CESAREAN SECTION     CHOLECYSTECTOMY N/A 11/17/2012   Procedure: LAPAROSCOPIC CHOLECYSTECTOMY WITH INTRAOPERATIVE CHOLANGIOGRAM;  Surgeon: Lynda Leos, MD;  Location: [facility];  Service: General;  Laterality: N/A;   COLONOSCOPY WITH PROPOFOL  N/A 04/21/2020   Procedure: COLONOSCOPY WITH PROPOFOL ;  Surgeon: Rollin Dover, MD;  [facility];  Service: Endoscopy;  Laterality: N/A;   ENDOSCOPIC RETROGRADE CHOLANGIOPANCREATOGRAPHY (ERCP) WITH PROPOFOL  N/A 06/11/2019    Procedure: ENDOSCOPIC RETROGRADE CHOLANGIOPANCREATOGRAPHY (ERCP) WITH PROPOFOL ;  Surgeon: Rollin Dover, MD;  [facility];  Service: Endoscopy;  Laterality: N/A;   ERCP N/A 05/07/2013   Procedure: ENDOSCOPIC RETROGRADE CHOLANGIOPANCREATOGRAPHY (ERCP);  Surgeon: Dover JONETTA Rollin, MD;  Location: [facility];  Service: Endoscopy;  Laterality: N/A;  start ercp first, if canulation fails switch to EUS    ESOPHAGOGASTRODUODENOSCOPY (EGD) WITH PROPOFOL  N/A 05/15/2019   Procedure: ESOPHAGOGASTRODUODENOSCOPY (EGD) WITH PROPOFOL ;  Surgeon: Harvey Margo CROME, MD;  [facility]: [facility];  Service: Endoscopy;  Laterality: N/A;   ESOPHAGOGASTRODUODENOSCOPY (EGD) WITH PROPOFOL  N/A 03/03/2020   Procedure: ESOPHAGOGASTRODUODENOSCOPY (EGD) WITH PROPOFOL ;  Surgeon: Rollin Dover, MD;  [facility];  Service: Endoscopy;  Laterality: N/A;   ESOPHAGOGASTRODUODENOSCOPY (EGD) WITH PROPOFOL  N/A 04/21/2020   Procedure: ESOPHAGOGASTRODUODENOSCOPY (EGD) WITH PROPOFOL ;  Surgeon: Rollin Dover, MD;  [facility]: [facility];  Service: Endoscopy;  Laterality: N/A;   EUS N/A 11/03/2012   Procedure: UPPER ENDOSCOPIC ULTRASOUND (EUS) LINEAR;  Surgeon: Dover JONETTA Rollin, MD;  [facility]: [facility];  Service: Endoscopy;  Laterality: N/A;   left arm     NECK SURGERY     POLYPECTOMY  04/21/2020   Procedure: POLYPECTOMY;  Surgeon: Rollin Dover, MD;  [facility];  Service: Endoscopy;;   REMOVAL OF STONES  06/11/2019  Procedure: REMOVAL OF STONES;  Surgeon: Rollin Dover, MD;  Location: [facility];  Service: Endoscopy;;   TOOTH EXTRACTION N/A 05/24/2022   Procedure: DENTAL RESTORATION/EXTRACTIONS;  Surgeon: Sheryle Hamilton, DMD;  Location: [facility];  Service: Oral Surgery;  Laterality: N/A;   TOTAL HIP ARTHROPLASTY Left 11/17/2022   Procedure: TOTAL HIP ARTHROPLASTY ANTERIOR APPROACH;  Surgeon: Beverley Evalene BIRCH, MD;  Location: [facility];  Service: Orthopedics;  Laterality: Left;   TOTAL SHOULDER ARTHROPLASTY Left  11/04/2023   Procedure: ARTHROPLASTY, SHOULDER, TOTAL;  Surgeon: Josefina Chew, MD;  Location: [facility];  Service: Orthopedics;  Laterality: Left;   TUBAL LIGATION     UPPER ESOPHAGEAL ENDOSCOPIC ULTRASOUND (EUS) N/A 06/11/2019   Procedure: UPPER ESOPHAGEAL ENDOSCOPIC ULTRASOUND (EUS);  Surgeon: Rollin Dover, MD;  Location: [facility];  Service: Endoscopy;  Laterality: N/A;   Patient Active Problem List   Diagnosis Date Noted   S/P reverse total shoulder arthroplasty, left 11/04/2023   Vertigo 05/28/2023   Palpitations 12/05/2022   Transaminitis 11/16/2022   Hip fracture (HCC) 11/15/2022   GERD (gastroesophageal reflux disease) 11/15/2022   Tobacco use 11/15/2022   Idiopathic hypotension 07/19/2022   Slurred speech 07/19/2022   Underweight 07/19/2022   Hypotension 07/19/2022   Seizure disorder (HCC) 07/18/2022   Chronic migraine w/o aura w/o status migrainosus, not intractable 07/18/2022   Cerebral cavernoma 07/18/2022   Acute GI bleed due to bleeding pyloric/gastric ulcer 05/15/2019   PUD (peptic ulcer disease)-EGD 05/15/2019 with bleeding pyloric/gastric ulcer, nonbleeding duodenal ulcers and gastritis    Hematemesis with nausea    Migraine headache 09/25/2015   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   Bilateral occipital neuralgia 01/02/2015   Migraine 01/02/2015   Chest pain of uncertain etiology 11/29/2014   DDD (degenerative disc disease), cervical 10/27/2014   DDD (degenerative disc disease), 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   DDD (degenerative disc disease), lumbosacral 10/27/2014   Hypokalemia 05/07/2013   Partial tear subscapularis tendon 12/27/2011    PCP: Dr. Jerilynn Carnes REFERRING PROVIDER: Dr. Chew Josefina  ONSET DATE: 11/04/23  REFERRING DIAG: s/p left reverse TSA  THERAPY DIAG:  Acute pain of left shoulder  Stiffness of left shoulder, not elsewhere classified  Other symptoms and signs involving the musculoskeletal system  Rationale for Evaluation  and Treatment: Rehabilitation  SUBJECTIVE:   SUBJECTIVE STATEMENT: S: Sunday night the whole arm was aching so bad I couldn't sleep  PRECAUTIONS: Shoulder-see protocol  WEIGHT BEARING RESTRICTIONS: Yes NWB  PAIN:  Are you having pain? Yes: NPRS scale: 3/10 Pain location: left shoulder Pain description: constant ache Aggravating factors: movement Relieving factors: ice  FALLS: Has patient fallen in last 6 months? No  PLOF: Independent  PATIENT GOALS: To have less pain and more mobility.  NEXT MD VISIT: 01/16/24  OBJECTIVE:   HAND DOMINANCE: Right  ADLs: Overall ADLs: Pt is unable to use the LUE for any ADLs at this time. Pt reports difficulty with sleeping, waking up a lot, sleeping in the recliner, tried the bed last night with a little better sleep. Pt is unable to reach overhead or behind back, cannot lift items.   FUNCTIONAL OUTCOME MEASURES: Upper Extremity Functional Scale (UEFS): 8/80=10%  UPPER EXTREMITY ROM:       Assessed in supine, er/IR adducted  Passive ROM Left eval Left 12/11/23  Shoulder flexion 61 129  Shoulder abduction 62 131  Shoulder internal rotation 90 90  Shoulder external rotation 0 50  (Blank rows = not tested)    UPPER EXTREMITY MMT:  Not assessed due to protocol & pain  MMT Left eval  Shoulder flexion   Shoulder abduction   Shoulder internal rotation   Shoulder external rotation   (Blank rows = not tested)   SENSATION: Pt reports intermittent numbness in the elbow  OBSERVATIONS: pt with mod fascial restrictions along upper arm, anterior shoulder, trapezius and scapular regions   TODAY'S TREATMENT:                                                                                                                              DATE:   12/23/23 -Manual techniques: to upper arm, pectoralis region, and trapezius; mod fascial restrictions- manual completed to reduce fascial restrictions, decrease pain, and improve ROM  -P/ROM:  supine, shoulder flexion, abduction, internal/external rotation, abduction, 5 reps -AA/ROM: supine-protraction, flexion, er, horizontal abduction, abduction, 10 reps -AA/ROM: sitting-protraction, flexion, er, horizontal abduction, abduction, 10 reps -Wall wash: 1'  -Pulleys: 1' flexion, 1' abduction -Theraband: yellow-row, extension, 10 reps  12/18/23 -Manual techniques: to upper arm, pectoralis region, and trapezius; mod fascial restrictions- manual completed to reduce fascial restrictions, decrease pain, and improve ROM  -P/ROM: supine, shoulder flexion, abduction, internal/external rotation, abduction, 12 reps -AA/ROM: standing-protraction, flexion, er, horizontal abduction, abduction, 10 reps -Wall wash: 1'  -Thumb tacks: shoulder height, 1' -Scapular A/ROM: extension, row, 10 reps -Pulleys: 1' flexion, 1' abduction  12/11/23 -Manual techniques: to upper arm, pectoralis region, and trapezius; mod fascial restrictions- manual completed to reduce fascial restrictions, decrease pain, and improve ROM  -P/ROM: supine, shoulder flexion, abduction, internal/external rotation, abduction, 10 reps -AA/ROM: supine-protraction, flexion, er, horizontal abduction, abduction, 10 reps -Thumb tacks: 1' low level -Wall Wash: 1' low level      PATIENT EDUCATION: Education details: reviewed HEP Person educated: Patient Education method: Explanation, Demonstration, and Handouts Education comprehension: verbalized understanding and returned demonstration  HOME EXERCISE PROGRAM: Eval: pendulums, table slides (no abduction) wrist A/ROM, elbow self-ROM 12/04/23: pulleys 3x/day, for 1-2' each 12/11/23: AA/ROM  GOALS: Goals reviewed with patient? Yes   SHORT TERM GOALS: Target date: 12/12/23  Pt will be provided with and educated on HEP to improve mobility in LUE required for use during ADL completion.   Goal status: IN PROGRESS  2.  Pt will increase LUE P/ROM by 50+ degrees to improve ability to  use LUE during dressing tasks with minimal compensatory techniques.   Goal status: MET  3.  Pt will increase LUE strength to 3+/5 to improve ability to reach for items at waist to chest height during bathing and grooming tasks.   Goal status: IN PROGRESS   LONG TERM GOALS: Target date: 01/11/24  Pt will decrease pain in LUE to 3/10 or less to improve ability to sleep for 2+ consecutive hours without waking due to pain.   Goal status: IN PROGRESS  2.  Pt will decrease LUE fascial restrictions to min amounts or less to improve mobility required for functional reaching tasks.   Goal status:  IN PROGRESS  3.  Pt will increase LUE A/ROM by 75+ degrees to improve ability to use LUE when reaching overhead or behind back during dressing and bathing tasks.   Goal status: IN PROGRESS  4.  Pt will increase LUE strength to 4+/5 or greater to improve ability to use LUE when lifting or carrying items during meal preparation/housework/yardwork tasks.   Goal status: IN PROGRESS  5.  Pt will return to highest level of function using LUE as non-dominant during functional task completion.   Goal status: IN PROGRESS   ASSESSMENT:  CLINICAL IMPRESSION: Pt reports MD went well. Pt had high pain over the weekend, has improved and is lower today. Continued with manual techniques and passive stretching with pt tolerating 75% ROM. Completed AA/ROM in supine and sitting, pt with ROM to 75% or greater. Progressed to scapular theraband today at low level, pt with no increased pain. Pt reports she is completing her HEP and is working to incorporate the LUE into her daily tasks. Continues to have pain at times, manages with medication, ice, and heat.     PERFORMANCE DEFICITS: in functional skills including in functional skills including ADLs, IADLs, coordination, tone, ROM, strength, pain, fascial restrictions, muscle spasms, and UE functional use     PLAN:  OT FREQUENCY: 2x/week  OT DURATION: 8  weeks  PLANNED INTERVENTIONS: 97168 OT Re-evaluation, 97535 self care/ADL training, 02889 therapeutic exercise, 97530 therapeutic activity, 97112 neuromuscular re-education, 97140 manual therapy, 97014 electrical stimulation unattended, patient/family education, and DME and/or AE instructions  CONSULTED AND AGREED WITH PLAN OF CARE: Patient  PLAN FOR NEXT SESSION: manual techniques, scapular A/ROM, P/ROM, AA/ROM    Sonny Cory, OTR/L  [phone] 12/23/2023, 3:13 PM

## 2023-12-25 ENCOUNTER — Ambulatory Visit (HOSPITAL_COMMUNITY): Admitting: Occupational Therapy

## 2023-12-25 ENCOUNTER — Encounter (HOSPITAL_COMMUNITY): Payer: Self-pay | Admitting: Occupational Therapy

## 2023-12-25 DIAGNOSIS — M25512 Pain in left shoulder: Secondary | ICD-10-CM | POA: Diagnosis not present

## 2023-12-25 DIAGNOSIS — R29898 Other symptoms and signs involving the musculoskeletal system: Secondary | ICD-10-CM

## 2023-12-25 DIAGNOSIS — M25612 Stiffness of left shoulder, not elsewhere classified: Secondary | ICD-10-CM

## 2023-12-25 NOTE — Therapy (Signed)
 OUTPATIENT OCCUPATIONAL THERAPY ORTHO TREATMENT  [name] MRN: [id number] DOB:April 15, 1971, 53 y.o., female Today's Date: 12/25/2023      END OF SESSION:  OT End of Session - 12/25/23 1337     Visit Number 11    Number of Visits 16    Date for OT Re-Evaluation 01/11/24    Authorization Type 1) [employer] Dual Complete Medicare 2) [place] Medicaid    Authorization Time Period no auth required    Progress Note Due on Visit 20    OT Start Time 1300    OT Stop Time 1339    OT Time Calculation (min) 39 min    Activity Tolerance Patient tolerated treatment well    Behavior During Therapy WFL for tasks assessed/performed                    Past Medical History:  Diagnosis Date   Asthma    seasonal   Bruising, spontaneous 12/08/2013   Cancer (HCC)    cervical cancer - had hysterectomy   Chronic back pain    Chronic back pain    DDD (degenerative disc disease), lumbar    DDD (degenerative disc disease), lumbar    Dysrhythmia    tachycardia   Fibromyalgia    Headache(784.0)    migraines   History of kidney stones    Ovarian cyst    Partial tear subscapularis tendon 12/27/2011   Scoliosis    Seizures (HCC)    started 9/12-   Past Surgical History:  Procedure Laterality Date   ABDOMINAL HYSTERECTOMY  2012   BACK SURGERY  1989   scoliosis throsic-rods   CESAREAN SECTION     CHOLECYSTECTOMY N/A 11/17/2012   Procedure: LAPAROSCOPIC CHOLECYSTECTOMY WITH INTRAOPERATIVE CHOLANGIOGRAM;  Surgeon: Lynda Leos, MD;  Location: [facility];  Service: General;  Laterality: N/A;   COLONOSCOPY WITH PROPOFOL  N/A 04/21/2020   Procedure: COLONOSCOPY WITH PROPOFOL ;  Surgeon: Rollin Dover, MD;  Location: [facility];  Service: Endoscopy;  Laterality: N/A;   ENDOSCOPIC RETROGRADE CHOLANGIOPANCREATOGRAPHY (ERCP) WITH PROPOFOL  N/A 06/11/2019   Procedure: ENDOSCOPIC RETROGRADE CHOLANGIOPANCREATOGRAPHY (ERCP) WITH PROPOFOL ;  Surgeon: Rollin Dover, MD;  Location: [facility];  Service: Endoscopy;  Laterality: N/A;   ERCP N/A 05/07/2013   Procedure: ENDOSCOPIC RETROGRADE CHOLANGIOPANCREATOGRAPHY (ERCP);  Surgeon: Dover JONETTA Rollin, MD;  Location: [facility];  Service: Endoscopy;  Laterality: N/A;  start ercp first, if canulation fails switch to EUS    ESOPHAGOGASTRODUODENOSCOPY (EGD) WITH PROPOFOL  N/A 05/15/2019   Procedure: ESOPHAGOGASTRODUODENOSCOPY (EGD) WITH PROPOFOL ;  Surgeon: Harvey Margo CROME, MD;  Location: [facility];  Service: Endoscopy;  Laterality: N/A;   ESOPHAGOGASTRODUODENOSCOPY (EGD) WITH PROPOFOL  N/A 03/03/2020   Procedure: ESOPHAGOGASTRODUODENOSCOPY (EGD) WITH PROPOFOL ;  Surgeon: Rollin Dover, MD;  Location: [facility];  Service: Endoscopy;  Laterality: N/A;   ESOPHAGOGASTRODUODENOSCOPY (EGD) WITH PROPOFOL  N/A 04/21/2020   Procedure: ESOPHAGOGASTRODUODENOSCOPY (EGD) WITH PROPOFOL ;  Surgeon: Rollin Dover, MD;  Location: [facility];  Service: Endoscopy;  Laterality: N/A;   EUS N/A 11/03/2012   Procedure: UPPER ENDOSCOPIC ULTRASOUND (EUS) LINEAR;  Surgeon: Dover JONETTA Rollin, MD;  Location: [facility];  Service: Endoscopy;  Laterality: N/A;   left arm     NECK SURGERY     POLYPECTOMY  04/21/2020   Procedure: POLYPECTOMY;  Surgeon: Rollin Dover, MD;  Location: [facility];  Service: Endoscopy;;   REMOVAL OF STONES  06/11/2019   Procedure: REMOVAL OF STONES;  Surgeon: Rollin Dover, MD;  Location: [facility];  Service: Endoscopy;;  TOOTH EXTRACTION N/A 05/24/2022   Procedure: DENTAL RESTORATION/EXTRACTIONS;  Surgeon: Sheryle Hamilton, DMD;  Location: [facility];  Service: Oral Surgery;  Laterality: N/A;   TOTAL HIP ARTHROPLASTY Left 11/17/2022   Procedure: TOTAL HIP ARTHROPLASTY ANTERIOR APPROACH;  Surgeon: Beverley Evalene BIRCH, MD;  Location: [facility];  Service: Orthopedics;  Laterality: Left;   TOTAL SHOULDER ARTHROPLASTY Left 11/04/2023   Procedure: ARTHROPLASTY, SHOULDER, TOTAL;  Surgeon: Josefina Chew, MD;  Location: [facility];  Service: Orthopedics;   Laterality: Left;   TUBAL LIGATION     UPPER ESOPHAGEAL ENDOSCOPIC ULTRASOUND (EUS) N/A 06/11/2019   Procedure: UPPER ESOPHAGEAL ENDOSCOPIC ULTRASOUND (EUS);  Surgeon: Rollin Dover, MD;  Location: [facility];  Service: Endoscopy;  Laterality: N/A;   Patient Active Problem List   Diagnosis Date Noted   S/P reverse total shoulder arthroplasty, left 11/04/2023   Vertigo 05/28/2023   Palpitations 12/05/2022   Transaminitis 11/16/2022   Hip fracture (HCC) 11/15/2022   GERD (gastroesophageal reflux disease) 11/15/2022   Tobacco use 11/15/2022   Idiopathic hypotension 07/19/2022   Slurred speech 07/19/2022   Underweight 07/19/2022   Hypotension 07/19/2022   Seizure disorder (HCC) 07/18/2022   Chronic migraine w/o aura w/o status migrainosus, not intractable 07/18/2022   Cerebral cavernoma 07/18/2022   Acute GI bleed due to bleeding pyloric/gastric ulcer 05/15/2019   PUD (peptic ulcer disease)-EGD 05/15/2019 with bleeding pyloric/gastric ulcer, nonbleeding duodenal ulcers and gastritis    Hematemesis with nausea    Migraine headache 09/25/2015   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   Bilateral occipital neuralgia 01/02/2015   Migraine 01/02/2015   Chest pain of uncertain etiology 11/29/2014   DDD (degenerative disc disease), cervical 10/27/2014   DDD (degenerative disc disease), 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   DDD (degenerative disc disease), lumbosacral 10/27/2014   Hypokalemia 05/07/2013   Partial tear subscapularis tendon 12/27/2011    PCP: Dr. Jerilynn Carnes REFERRING PROVIDER: Dr. Chew Josefina  ONSET DATE: 11/04/23  REFERRING DIAG: s/p left reverse TSA  THERAPY DIAG:  Acute pain of left shoulder  Stiffness of left shoulder, not elsewhere classified  Other symptoms and signs involving the musculoskeletal system  Rationale for Evaluation and Treatment: Rehabilitation  SUBJECTIVE:   SUBJECTIVE STATEMENT: S: It's doing pretty good.   PRECAUTIONS:  Shoulder-see protocol  WEIGHT BEARING RESTRICTIONS: Yes NWB  PAIN:  Are you having pain? No  FALLS: Has patient fallen in last 6 months? No  PLOF: Independent  PATIENT GOALS: To have less pain and more mobility.  NEXT MD VISIT: 01/16/24  OBJECTIVE:   HAND DOMINANCE: Right  ADLs: Overall ADLs: Pt is unable to use the LUE for any ADLs at this time. Pt reports difficulty with sleeping, waking up a lot, sleeping in the recliner, tried the bed last night with a little better sleep. Pt is unable to reach overhead or behind back, cannot lift items.   FUNCTIONAL OUTCOME MEASURES: Upper Extremity Functional Scale (UEFS): 8/80=10%  UPPER EXTREMITY ROM:       Assessed in supine, er/IR adducted  Passive ROM Left eval Left 12/11/23  Shoulder flexion 61 129  Shoulder abduction 62 131  Shoulder internal rotation 90 90  Shoulder external rotation 0 50  (Blank rows = not tested)    UPPER EXTREMITY MMT:     Not assessed due to protocol & pain  MMT Left eval  Shoulder flexion   Shoulder abduction   Shoulder internal rotation   Shoulder external rotation   (Blank rows = not tested)   SENSATION: Pt reports  intermittent numbness in the elbow  OBSERVATIONS: pt with mod fascial restrictions along upper arm, anterior shoulder, trapezius and scapular regions   TODAY'S TREATMENT:                                                                                                                              DATE:   12/25/23 -Manual techniques: to upper arm, pectoralis region, and trapezius; mod fascial restrictions- manual completed to reduce fascial restrictions, decrease pain, and improve ROM  -P/ROM: supine, shoulder flexion, abduction, internal/external rotation, abduction, 5 reps -AA/ROM: supine-protraction, flexion, er, horizontal abduction, abduction, 12 reps -Proximal shoulder strengthening: supine-paddles, criss cross, circles each direction, 10 reps -AA/ROM:  sitting-protraction, flexion, er, horizontal abduction, abduction, 10 reps -Wall wash: 1'  -Theraband: red-row, extension, 10 reps -Pulleys: 1' flexion, 1' abduction -UBE: level 1, 3' forward, 3' reverse, pace: 4.5  12/23/23 -Manual techniques: to upper arm, pectoralis region, and trapezius; mod fascial restrictions- manual completed to reduce fascial restrictions, decrease pain, and improve ROM  -P/ROM: supine, shoulder flexion, abduction, internal/external rotation, abduction, 5 reps -AA/ROM: supine-protraction, flexion, er, horizontal abduction, abduction, 10 reps -AA/ROM: sitting-protraction, flexion, er, horizontal abduction, abduction, 10 reps -Wall wash: 1'  -Pulleys: 1' flexion, 1' abduction -Theraband: yellow-row, extension, 10 reps  12/18/23 -Manual techniques: to upper arm, pectoralis region, and trapezius; mod fascial restrictions- manual completed to reduce fascial restrictions, decrease pain, and improve ROM  -P/ROM: supine, shoulder flexion, abduction, internal/external rotation, abduction, 12 reps -AA/ROM: standing-protraction, flexion, er, horizontal abduction, abduction, 10 reps -Wall wash: 1'  -Thumb tacks: shoulder height, 1' -Scapular A/ROM: extension, row, 10 reps -Pulleys: 1' flexion, 1' abduction      PATIENT EDUCATION: Education details: reviewed HEP Person educated: Patient Education method: Explanation, Demonstration, and Handouts Education comprehension: verbalized understanding and returned demonstration  HOME EXERCISE PROGRAM: Eval: pendulums, table slides (no abduction) wrist A/ROM, elbow self-ROM 12/04/23: pulleys 3x/day, for 1-2' each 12/11/23: AA/ROM  GOALS: Goals reviewed with patient? Yes   SHORT TERM GOALS: Target date: 12/12/23  Pt will be provided with and educated on HEP to improve mobility in LUE required for use during ADL completion.   Goal status: IN PROGRESS  2.  Pt will increase LUE P/ROM by 50+ degrees to improve ability to use  LUE during dressing tasks with minimal compensatory techniques.   Goal status: MET  3.  Pt will increase LUE strength to 3+/5 to improve ability to reach for items at waist to chest height during bathing and grooming tasks.   Goal status: IN PROGRESS   LONG TERM GOALS: Target date: 01/11/24  Pt will decrease pain in LUE to 3/10 or less to improve ability to sleep for 2+ consecutive hours without waking due to pain.   Goal status: IN PROGRESS  2.  Pt will decrease LUE fascial restrictions to min amounts or less to improve mobility required for functional reaching tasks.   Goal status: IN PROGRESS  3.  Pt will  increase LUE A/ROM by 75+ degrees to improve ability to use LUE when reaching overhead or behind back during dressing and bathing tasks.   Goal status: IN PROGRESS  4.  Pt will increase LUE strength to 4+/5 or greater to improve ability to use LUE when lifting or carrying items during meal preparation/housework/yardwork tasks.   Goal status: IN PROGRESS  5.  Pt will return to highest level of function using LUE as non-dominant during functional task completion.   Goal status: IN PROGRESS   ASSESSMENT:  CLINICAL IMPRESSION: Pt reports HEP is going well, she is sleeping better now too. Continued with manual techniques noting improvement in trigger point in trapezius. Continued with AA/ROM, pt achieving ROM to approximately 65% range. Added proximal shoulder strengthening and upgraded theraband to red. Added UBE without difficulty. Verbal cuing for form and technique. Tasks focusing on posture and correct form to facilitate appropriate mobility during functional tasks at home.     PERFORMANCE DEFICITS: in functional skills including in functional skills including ADLs, IADLs, coordination, tone, ROM, strength, pain, fascial restrictions, muscle spasms, and UE functional use     PLAN:  OT FREQUENCY: 2x/week  OT DURATION: 8 weeks  PLANNED INTERVENTIONS: 97168 OT  Re-evaluation, 97535 self care/ADL training, 02889 therapeutic exercise, 97530 therapeutic activity, 97112 neuromuscular re-education, 97140 manual therapy, 97014 electrical stimulation unattended, patient/family education, and DME and/or AE instructions  CONSULTED AND AGREED WITH PLAN OF CARE: Patient  PLAN FOR NEXT SESSION: manual techniques, scapular A/ROM, P/ROM, AA/ROM    Sonny Cory, OTR/L  [phone] 12/25/2023, 1:40 PM

## 2023-12-30 ENCOUNTER — Ambulatory Visit (HOSPITAL_COMMUNITY): Admitting: Occupational Therapy

## 2023-12-30 ENCOUNTER — Encounter (HOSPITAL_COMMUNITY): Payer: Self-pay | Admitting: Occupational Therapy

## 2023-12-30 DIAGNOSIS — M25612 Stiffness of left shoulder, not elsewhere classified: Secondary | ICD-10-CM

## 2023-12-30 DIAGNOSIS — M25512 Pain in left shoulder: Secondary | ICD-10-CM | POA: Diagnosis not present

## 2023-12-30 DIAGNOSIS — R29898 Other symptoms and signs involving the musculoskeletal system: Secondary | ICD-10-CM

## 2023-12-30 NOTE — Therapy (Signed)
 OUTPATIENT OCCUPATIONAL THERAPY ORTHO TREATMENT  [name] MRN: [id number] DOB:1971-01-24, 53 y.o., female Today's Date: 12/30/2023      END OF SESSION:  OT End of Session - 12/30/23 1457     Visit Number 12    Number of Visits 16    Date for OT Re-Evaluation 01/11/24    Authorization Type 1) [employer] Dual Complete Medicare 2) [place] [employer]    Authorization Time Period no auth required    Progress Note Due on Visit 20    OT Start Time 1432    OT Stop Time 1513    OT Time Calculation (min) 41 min    Activity Tolerance Patient tolerated treatment well    Behavior During Therapy WFL for tasks assessed/performed                     Past Medical History:  Diagnosis Date   Asthma    seasonal   Bruising, spontaneous 12/08/2013   Cancer (HCC)    cervical cancer - had hysterectomy   Chronic back pain    Chronic back pain    DDD (degenerative disc disease), lumbar    DDD (degenerative disc disease), lumbar    Dysrhythmia    tachycardia   Fibromyalgia    Headache(784.0)    migraines   History of kidney stones    Ovarian cyst    Partial tear subscapularis tendon 12/27/2011   Scoliosis    Seizures (HCC)    started 9/12-   Past Surgical History:  Procedure Laterality Date   ABDOMINAL HYSTERECTOMY  2012   BACK SURGERY  1989   scoliosis throsic-rods   CESAREAN SECTION     CHOLECYSTECTOMY N/A 11/17/2012   Procedure: LAPAROSCOPIC CHOLECYSTECTOMY WITH INTRAOPERATIVE CHOLANGIOGRAM;  Surgeon: Lynda Leos, MD;  Location: [facility];  Service: General;  Laterality: N/A;   COLONOSCOPY WITH PROPOFOL  N/A 04/21/2020   Procedure: COLONOSCOPY WITH PROPOFOL ;  Surgeon: Rollin Dover, MD;  Location: [facility];  Service: Endoscopy;  Laterality: N/A;   ENDOSCOPIC RETROGRADE CHOLANGIOPANCREATOGRAPHY (ERCP) WITH PROPOFOL  N/A 06/11/2019   Procedure: ENDOSCOPIC RETROGRADE CHOLANGIOPANCREATOGRAPHY (ERCP) WITH PROPOFOL ;  Surgeon: Rollin Dover, MD;  Location: [facility];  Service: Endoscopy;  Laterality: N/A;   ERCP N/A 05/07/2013   Procedure: ENDOSCOPIC RETROGRADE CHOLANGIOPANCREATOGRAPHY (ERCP);  Surgeon: Dover JONETTA Rollin, MD;  Location: [facility];  Service: Endoscopy;  Laterality: N/A;  start ercp first, if canulation fails switch to EUS    ESOPHAGOGASTRODUODENOSCOPY (EGD) WITH PROPOFOL  N/A 05/15/2019   Procedure: ESOPHAGOGASTRODUODENOSCOPY (EGD) WITH PROPOFOL ;  Surgeon: Harvey Margo CROME, MD;  Location: [facility];  Service: Endoscopy;  Laterality: N/A;   ESOPHAGOGASTRODUODENOSCOPY (EGD) WITH PROPOFOL  N/A 03/03/2020   Procedure: ESOPHAGOGASTRODUODENOSCOPY (EGD) WITH PROPOFOL ;  Surgeon: Rollin Dover, MD;  Location: [facility];  Service: Endoscopy;  Laterality: N/A;   ESOPHAGOGASTRODUODENOSCOPY (EGD) WITH PROPOFOL  N/A 04/21/2020   Procedure: ESOPHAGOGASTRODUODENOSCOPY (EGD) WITH PROPOFOL ;  Surgeon: Rollin Dover, MD;  Location: [facility];  Service: Endoscopy;  Laterality: N/A;   EUS N/A 11/03/2012   Procedure: UPPER ENDOSCOPIC ULTRASOUND (EUS) LINEAR;  Surgeon: Dover JONETTA Rollin, MD;  Location: [facility];  Service: Endoscopy;  Laterality: N/A;   left arm     NECK SURGERY     POLYPECTOMY  04/21/2020   Procedure: POLYPECTOMY;  Surgeon: Rollin Dover, MD;  Location: [facility];  Service: Endoscopy;;   REMOVAL OF STONES  06/11/2019   Procedure: REMOVAL OF STONES;  Surgeon: Rollin Dover, MD;  Location: [facility];  Service: Endoscopy;;  TOOTH EXTRACTION N/A 05/24/2022   Procedure: DENTAL RESTORATION/EXTRACTIONS;  Surgeon: Sheryle Hamilton, DMD;  Location: [facility];  Service: Oral Surgery;  Laterality: N/A;   TOTAL HIP ARTHROPLASTY Left 11/17/2022   Procedure: TOTAL HIP ARTHROPLASTY ANTERIOR APPROACH;  Surgeon: Beverley Evalene BIRCH, MD;  Location: [facility];  Service: Orthopedics;  Laterality: Left;   TOTAL SHOULDER ARTHROPLASTY Left 11/04/2023   Procedure: ARTHROPLASTY, SHOULDER, TOTAL;  Surgeon: Josefina Chew, MD;  Location: [facility];  Service: Orthopedics;   Laterality: Left;   TUBAL LIGATION     UPPER ESOPHAGEAL ENDOSCOPIC ULTRASOUND (EUS) N/A 06/11/2019   Procedure: UPPER ESOPHAGEAL ENDOSCOPIC ULTRASOUND (EUS);  Surgeon: Rollin Dover, MD;  Location: [facility];  Service: Endoscopy;  Laterality: N/A;   Patient Active Problem List   Diagnosis Date Noted   S/P reverse total shoulder arthroplasty, left 11/04/2023   Vertigo 05/28/2023   Palpitations 12/05/2022   Transaminitis 11/16/2022   Hip fracture (HCC) 11/15/2022   GERD (gastroesophageal reflux disease) 11/15/2022   Tobacco use 11/15/2022   Idiopathic hypotension 07/19/2022   Slurred speech 07/19/2022   Underweight 07/19/2022   Hypotension 07/19/2022   Seizure disorder (HCC) 07/18/2022   Chronic migraine w/o aura w/o status migrainosus, not intractable 07/18/2022   Cerebral cavernoma 07/18/2022   Acute GI bleed due to bleeding pyloric/gastric ulcer 05/15/2019   PUD (peptic ulcer disease)-EGD 05/15/2019 with bleeding pyloric/gastric ulcer, nonbleeding duodenal ulcers and gastritis    Hematemesis with nausea    Migraine headache 09/25/2015   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   Bilateral occipital neuralgia 01/02/2015   Migraine 01/02/2015   Chest pain of uncertain etiology 11/29/2014   DDD (degenerative disc disease), cervical 10/27/2014   DDD (degenerative disc disease), 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   DDD (degenerative disc disease), lumbosacral 10/27/2014   Hypokalemia 05/07/2013   Partial tear subscapularis tendon 12/27/2011    PCP: Dr. Jerilynn Carnes REFERRING PROVIDER: Dr. Chew Josefina  ONSET DATE: 11/04/23  REFERRING DIAG: s/p left reverse TSA  THERAPY DIAG:  Acute pain of left shoulder  Stiffness of left shoulder, not elsewhere classified  Other symptoms and signs involving the musculoskeletal system  Rationale for Evaluation and Treatment: Rehabilitation  SUBJECTIVE:   SUBJECTIVE STATEMENT: S: Yesterday it hurt all day long.   PRECAUTIONS:  Shoulder-see protocol  WEIGHT BEARING RESTRICTIONS: Yes NWB  PAIN:  Are you having pain? Yes: NPRS scale: 3/10 Pain location: left distal humerus Pain description: throbbing Aggravating factors: weather Relieving factors: rest, heat  FALLS: Has patient fallen in last 6 months? No  PLOF: Independent  PATIENT GOALS: To have less pain and more mobility.  NEXT MD VISIT: 01/16/24  OBJECTIVE:   HAND DOMINANCE: Right  ADLs: Overall ADLs: Pt is unable to use the LUE for any ADLs at this time. Pt reports difficulty with sleeping, waking up a lot, sleeping in the recliner, tried the bed last night with a little better sleep. Pt is unable to reach overhead or behind back, cannot lift items.   FUNCTIONAL OUTCOME MEASURES: Upper Extremity Functional Scale (UEFS): 8/80=10%  UPPER EXTREMITY ROM:       Assessed in supine, er/IR adducted  Passive ROM Left eval Left 12/11/23  Shoulder flexion 61 129  Shoulder abduction 62 131  Shoulder internal rotation 90 90  Shoulder external rotation 0 50  (Blank rows = not tested)    UPPER EXTREMITY MMT:     Not assessed due to protocol & pain  MMT Left eval  Shoulder flexion   Shoulder abduction  Shoulder internal rotation   Shoulder external rotation   (Blank rows = not tested)   SENSATION: Pt reports intermittent numbness in the elbow  OBSERVATIONS: pt with mod fascial restrictions along upper arm, anterior shoulder, trapezius and scapular regions   TODAY'S TREATMENT:                                                                                                                              DATE:   12/30/23 -Manual techniques: to upper arm, pectoralis region, and trapezius; mod fascial restrictions- manual completed to reduce fascial restrictions, decrease pain, and improve ROM  -P/ROM: supine, shoulder flexion, abduction, internal/external rotation, abduction, 5 reps -A/ROM: supine-protraction, er, horizontal abduction,  abduction, 10 reps -AA/ROM: supine-flexion, 10 reps -Proximal shoulder strengthening: supine-paddles, criss cross, circles each direction, 10 reps -AA/ROM: standing-protraction, flexion, er, horizontal abduction, abduction, 12 reps -Wall wash: 1'  -Thumb tacks: shoulder height, 1'  -Functional reaching: pt placing 10 cones on middle shelf of overhead cabinet in flexion, removing in abduction -Theraband: red-row, extension, 10 reps -UBE: level 1, 3' forward, 3' reverse, pace: 4.0  12/25/23 -Manual techniques: to upper arm, pectoralis region, and trapezius; mod fascial restrictions- manual completed to reduce fascial restrictions, decrease pain, and improve ROM  -P/ROM: supine, shoulder flexion, abduction, internal/external rotation, abduction, 5 reps -AA/ROM: supine-protraction, flexion, er, horizontal abduction, abduction, 12 reps -Proximal shoulder strengthening: supine-paddles, criss cross, circles each direction, 10 reps -AA/ROM: sitting-protraction, flexion, er, horizontal abduction, abduction, 10 reps -Wall wash: 1'  -Theraband: red-row, extension, 10 reps -Pulleys: 1' flexion, 1' abduction -UBE: level 1, 3' forward, 3' reverse, pace: 4.5  12/23/23 -Manual techniques: to upper arm, pectoralis region, and trapezius; mod fascial restrictions- manual completed to reduce fascial restrictions, decrease pain, and improve ROM  -P/ROM: supine, shoulder flexion, abduction, internal/external rotation, abduction, 5 reps -AA/ROM: supine-protraction, flexion, er, horizontal abduction, abduction, 10 reps -AA/ROM: sitting-protraction, flexion, er, horizontal abduction, abduction, 10 reps -Wall wash: 1'  -Pulleys: 1' flexion, 1' abduction -Theraband: yellow-row, extension, 10 reps     PATIENT EDUCATION: Education details: reviewed HEP Person educated: Patient Education method: Programmer, multimedia, Demonstration, and Handouts Education comprehension: verbalized understanding and returned  demonstration  HOME EXERCISE PROGRAM: Eval: pendulums, table slides (no abduction) wrist A/ROM, elbow self-ROM 12/04/23: pulleys 3x/day, for 1-2' each 12/11/23: AA/ROM  GOALS: Goals reviewed with patient? Yes   SHORT TERM GOALS: Target date: 12/12/23  Pt will be provided with and educated on HEP to improve mobility in LUE required for use during ADL completion.   Goal status: IN PROGRESS  2.  Pt will increase LUE P/ROM by 50+ degrees to improve ability to use LUE during dressing tasks with minimal compensatory techniques.   Goal status: MET  3.  Pt will increase LUE strength to 3+/5 to improve ability to reach for items at waist to chest height during bathing and grooming tasks.   Goal status: IN PROGRESS   LONG TERM GOALS: Target date: 01/11/24  Pt will decrease pain in LUE to 3/10 or less to improve ability to sleep for 2+ consecutive hours without waking due to pain.   Goal status: IN PROGRESS  2.  Pt will decrease LUE fascial restrictions to min amounts or less to improve mobility required for functional reaching tasks.   Goal status: IN PROGRESS  3.  Pt will increase LUE A/ROM by 75+ degrees to improve ability to use LUE when reaching overhead or behind back during dressing and bathing tasks.   Goal status: IN PROGRESS  4.  Pt will increase LUE strength to 4+/5 or greater to improve ability to use LUE when lifting or carrying items during meal preparation/housework/yardwork tasks.   Goal status: IN PROGRESS  5.  Pt will return to highest level of function using LUE as non-dominant during functional task completion.   Goal status: IN PROGRESS   ASSESSMENT:  CLINICAL IMPRESSION: Pt reports more soreness over the weekend, continues to complete HEP. Pt with soreness during horizontal abduction with P/ROM. Began progressing to A/ROM in supine, only completing flexion using dowel rod to gain increased end ROM. Continued with proximal shoulder strengthening and scapular  strengthening. Added functional reaching tasks to progress towards LUE use during functional tasks at home. Verbal cuing for form and technique, min fatigue at end of session.     PERFORMANCE DEFICITS: in functional skills including in functional skills including ADLs, IADLs, coordination, tone, ROM, strength, pain, fascial restrictions, muscle spasms, and UE functional use     PLAN:  OT FREQUENCY: 2x/week  OT DURATION: 8 weeks  PLANNED INTERVENTIONS: 97168 OT Re-evaluation, 97535 self care/ADL training, 02889 therapeutic exercise, 97530 therapeutic activity, 97112 neuromuscular re-education, 97140 manual therapy, 97014 electrical stimulation unattended, patient/family education, and DME and/or AE instructions  CONSULTED AND AGREED WITH PLAN OF CARE: Patient  PLAN FOR NEXT SESSION: manual techniques, scapular A/ROM, P/ROM, AA/ROM; update HEP for A/ROM    Sonny Cory, OTR/L  [phone] 12/30/2023, 3:13 PM

## 2024-01-01 ENCOUNTER — Encounter (HOSPITAL_COMMUNITY): Payer: Self-pay | Admitting: Occupational Therapy

## 2024-01-01 ENCOUNTER — Ambulatory Visit (HOSPITAL_COMMUNITY): Admitting: Occupational Therapy

## 2024-01-01 DIAGNOSIS — M25512 Pain in left shoulder: Secondary | ICD-10-CM | POA: Diagnosis not present

## 2024-01-01 DIAGNOSIS — R29898 Other symptoms and signs involving the musculoskeletal system: Secondary | ICD-10-CM

## 2024-01-01 DIAGNOSIS — M25612 Stiffness of left shoulder, not elsewhere classified: Secondary | ICD-10-CM

## 2024-01-01 NOTE — Therapy (Signed)
 OUTPATIENT OCCUPATIONAL THERAPY ORTHO TREATMENT  [name] MRN: [id number] DOB:05-03-1971, 53 y.o., female Today's Date: 01/01/2024      END OF SESSION:  OT End of Session - 01/01/24 1336     Visit Number 13    Number of Visits 16    Date for OT Re-Evaluation 01/11/24    Authorization Type 1) [employer] Dual Complete Medicare 2) [place] Medicaid    Authorization Time Period no auth required    Progress Note Due on Visit 20    OT Start Time 1300    OT Stop Time 1339    OT Time Calculation (min) 39 min    Activity Tolerance Patient tolerated treatment well    Behavior During Therapy WFL for tasks assessed/performed                      Past Medical History:  Diagnosis Date   Asthma    seasonal   Bruising, spontaneous 12/08/2013   Cancer (HCC)    cervical cancer - had hysterectomy   Chronic back pain    Chronic back pain    DDD (degenerative disc disease), lumbar    DDD (degenerative disc disease), lumbar    Dysrhythmia    tachycardia   Fibromyalgia    Headache(784.0)    migraines   History of kidney stones    Ovarian cyst    Partial tear subscapularis tendon 12/27/2011   Scoliosis    Seizures (HCC)    started 9/12-   Past Surgical History:  Procedure Laterality Date   ABDOMINAL HYSTERECTOMY  2012   BACK SURGERY  1989   scoliosis throsic-rods   CESAREAN SECTION     CHOLECYSTECTOMY N/A 11/17/2012   Procedure: LAPAROSCOPIC CHOLECYSTECTOMY WITH INTRAOPERATIVE CHOLANGIOGRAM;  Surgeon: Lynda Leos, MD;  Location: [facility];  Service: General;  Laterality: N/A;   COLONOSCOPY WITH PROPOFOL  N/A 04/21/2020   Procedure: COLONOSCOPY WITH PROPOFOL ;  Surgeon: Rollin Dover, MD;  Location: [facility];  Service: Endoscopy;  Laterality: N/A;   ENDOSCOPIC RETROGRADE CHOLANGIOPANCREATOGRAPHY (ERCP) WITH PROPOFOL  N/A 06/11/2019   Procedure: ENDOSCOPIC RETROGRADE CHOLANGIOPANCREATOGRAPHY (ERCP) WITH PROPOFOL ;  Surgeon: Rollin Dover, MD;  Location: [facility];  Service: Endoscopy;  Laterality: N/A;   ERCP N/A 05/07/2013   Procedure: ENDOSCOPIC RETROGRADE CHOLANGIOPANCREATOGRAPHY (ERCP);  Surgeon: Dover JONETTA Rollin, MD;  Location: [facility];  Service: Endoscopy;  Laterality: N/A;  start ercp first, if canulation fails switch to EUS    ESOPHAGOGASTRODUODENOSCOPY (EGD) WITH PROPOFOL  N/A 05/15/2019   Procedure: ESOPHAGOGASTRODUODENOSCOPY (EGD) WITH PROPOFOL ;  Surgeon: Harvey Margo CROME, MD;  Location: [facility];  Service: Endoscopy;  Laterality: N/A;   ESOPHAGOGASTRODUODENOSCOPY (EGD) WITH PROPOFOL  N/A 03/03/2020   Procedure: ESOPHAGOGASTRODUODENOSCOPY (EGD) WITH PROPOFOL ;  Surgeon: Rollin Dover, MD;  Location: [facility];  Service: Endoscopy;  Laterality: N/A;   ESOPHAGOGASTRODUODENOSCOPY (EGD) WITH PROPOFOL  N/A 04/21/2020   Procedure: ESOPHAGOGASTRODUODENOSCOPY (EGD) WITH PROPOFOL ;  Surgeon: Rollin Dover, MD;  Location: [facility];  Service: Endoscopy;  Laterality: N/A;   EUS N/A 11/03/2012   Procedure: UPPER ENDOSCOPIC ULTRASOUND (EUS) LINEAR;  Surgeon: Dover JONETTA Rollin, MD;  Location: [facility];  Service: Endoscopy;  Laterality: N/A;   left arm     NECK SURGERY     POLYPECTOMY  04/21/2020   Procedure: POLYPECTOMY;  Surgeon: Rollin Dover, MD;  Location: [facility];  Service: Endoscopy;;   REMOVAL OF STONES  06/11/2019   Procedure: REMOVAL OF STONES;  Surgeon: Rollin Dover, MD;  Location: [facility];  Service: Endoscopy;;  TOOTH EXTRACTION N/A 05/24/2022   Procedure: DENTAL RESTORATION/EXTRACTIONS;  Surgeon: Sheryle Hamilton, DMD;  Location: [facility];  Service: Oral Surgery;  Laterality: N/A;   TOTAL HIP ARTHROPLASTY Left 11/17/2022   Procedure: TOTAL HIP ARTHROPLASTY ANTERIOR APPROACH;  Surgeon: Beverley Evalene BIRCH, MD;  Location: [facility];  Service: Orthopedics;  Laterality: Left;   TOTAL SHOULDER ARTHROPLASTY Left 11/04/2023   Procedure: ARTHROPLASTY, SHOULDER, TOTAL;  Surgeon: Josefina Chew, MD;  Location: [facility];  Service: Orthopedics;   Laterality: Left;   TUBAL LIGATION     UPPER ESOPHAGEAL ENDOSCOPIC ULTRASOUND (EUS) N/A 06/11/2019   Procedure: UPPER ESOPHAGEAL ENDOSCOPIC ULTRASOUND (EUS);  Surgeon: Rollin Dover, MD;  Location: [facility];  Service: Endoscopy;  Laterality: N/A;   Patient Active Problem List   Diagnosis Date Noted   S/P reverse total shoulder arthroplasty, left 11/04/2023   Vertigo 05/28/2023   Palpitations 12/05/2022   Transaminitis 11/16/2022   Hip fracture (HCC) 11/15/2022   GERD (gastroesophageal reflux disease) 11/15/2022   Tobacco use 11/15/2022   Idiopathic hypotension 07/19/2022   Slurred speech 07/19/2022   Underweight 07/19/2022   Hypotension 07/19/2022   Seizure disorder (HCC) 07/18/2022   Chronic migraine w/o aura w/o status migrainosus, not intractable 07/18/2022   Cerebral cavernoma 07/18/2022   Acute GI bleed due to bleeding pyloric/gastric ulcer 05/15/2019   PUD (peptic ulcer disease)-EGD 05/15/2019 with bleeding pyloric/gastric ulcer, nonbleeding duodenal ulcers and gastritis    Hematemesis with nausea    Migraine headache 09/25/2015   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   Bilateral occipital neuralgia 01/02/2015   Migraine 01/02/2015   Chest pain of uncertain etiology 11/29/2014   DDD (degenerative disc disease), cervical 10/27/2014   DDD (degenerative disc disease), 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   DDD (degenerative disc disease), lumbosacral 10/27/2014   Hypokalemia 05/07/2013   Partial tear subscapularis tendon 12/27/2011    PCP: Dr. Jerilynn Carnes REFERRING PROVIDER: Dr. Chew Josefina  ONSET DATE: 11/04/23  REFERRING DIAG: s/p left reverse TSA  THERAPY DIAG:  Acute pain of left shoulder  Stiffness of left shoulder, not elsewhere classified  Other symptoms and signs involving the musculoskeletal system  Rationale for Evaluation and Treatment: Rehabilitation  SUBJECTIVE:   SUBJECTIVE STATEMENT: S: It was a little sore but it's ok now.    PRECAUTIONS: Shoulder-see protocol  WEIGHT BEARING RESTRICTIONS: Yes NWB  PAIN:  Are you having pain? Yes: NPRS scale: 1/10 Pain location: left distal humerus Pain description: throbbing Aggravating factors: weather Relieving factors: rest, heat  FALLS: Has patient fallen in last 6 months? No  PLOF: Independent  PATIENT GOALS: To have less pain and more mobility.  NEXT MD VISIT: 01/16/24  OBJECTIVE:   HAND DOMINANCE: Right  ADLs: Overall ADLs: Pt is unable to use the LUE for any ADLs at this time. Pt reports difficulty with sleeping, waking up a lot, sleeping in the recliner, tried the bed last night with a little better sleep. Pt is unable to reach overhead or behind back, cannot lift items.   FUNCTIONAL OUTCOME MEASURES: Upper Extremity Functional Scale (UEFS): 8/80=10%  UPPER EXTREMITY ROM:       Assessed in supine, er/IR adducted  Passive ROM Left eval Left 12/11/23  Shoulder flexion 61 129  Shoulder abduction 62 131  Shoulder internal rotation 90 90  Shoulder external rotation 0 50  (Blank rows = not tested)    UPPER EXTREMITY MMT:     Not assessed due to protocol & pain  MMT Left eval  Shoulder flexion   Shoulder  abduction   Shoulder internal rotation   Shoulder external rotation   (Blank rows = not tested)   SENSATION: Pt reports intermittent numbness in the elbow  OBSERVATIONS: pt with mod fascial restrictions along upper arm, anterior shoulder, trapezius and scapular regions   TODAY'S TREATMENT:                                                                                                                              DATE:    01/01/24 -Manual techniques: to upper arm, pectoralis region, and trapezius; mod fascial restrictions- manual completed to reduce fascial restrictions, decrease pain, and improve ROM  -P/ROM: supine, shoulder flexion, abduction, internal/external rotation, abduction, 5 reps -A/ROM: supine-protraction, flexion, er,  horizontal abduction, abduction, 12 reps -Proximal shoulder strengthening: supine-paddles, criss cross, circles each direction, 12 reps -A/ROM: standing-protraction, flexion, er, horizontal abduction, abduction, 10 reps -Functional reaching: pt placing 10 cones on middle shelf of overhead cabinet in flexion, removing in abduction -Scapular Theraband: red-row, extension, retraction, 10 reps -Overhead lacing: seated, lacing from top down then reversing -UBE: level 1, 3' forward, 3' reverse, pace: 4.0  12/30/23 -Manual techniques: to upper arm, pectoralis region, and trapezius; mod fascial restrictions- manual completed to reduce fascial restrictions, decrease pain, and improve ROM  -P/ROM: supine, shoulder flexion, abduction, internal/external rotation, abduction, 5 reps -A/ROM: supine-protraction, er, horizontal abduction, abduction, 10 reps -AA/ROM: supine-flexion, 10 reps -Proximal shoulder strengthening: supine-paddles, criss cross, circles each direction, 10 reps -AA/ROM: standing-protraction, flexion, er, horizontal abduction, abduction, 12 reps -Wall wash: 1'  -Thumb tacks: shoulder height, 1'  -Functional reaching: pt placing 10 cones on middle shelf of overhead cabinet in flexion, removing in abduction -Theraband: red-row, extension, 10 reps -UBE: level 1, 3' forward, 3' reverse, pace: 4.0  12/25/23 -Manual techniques: to upper arm, pectoralis region, and trapezius; mod fascial restrictions- manual completed to reduce fascial restrictions, decrease pain, and improve ROM  -P/ROM: supine, shoulder flexion, abduction, internal/external rotation, abduction, 5 reps -AA/ROM: supine-protraction, flexion, er, horizontal abduction, abduction, 12 reps -Proximal shoulder strengthening: supine-paddles, criss cross, circles each direction, 10 reps -AA/ROM: sitting-protraction, flexion, er, horizontal abduction, abduction, 10 reps -Wall wash: 1'  -Theraband: red-row, extension, 10 reps -Pulleys: 1'  flexion, 1' abduction -UBE: level 1, 3' forward, 3' reverse, pace: 4.5     PATIENT EDUCATION: Education details: A/ROM Person educated: Patient Education method: Programmer, multimedia, Demonstration, and Handouts Education comprehension: verbalized understanding and returned demonstration  HOME EXERCISE PROGRAM: Eval: pendulums, table slides (no abduction) wrist A/ROM, elbow self-ROM 12/04/23: pulleys 3x/day, for 1-2' each 12/11/23: AA/ROM 01/01/24: A/ROM  GOALS: Goals reviewed with patient? Yes   SHORT TERM GOALS: Target date: 12/12/23  Pt will be provided with and educated on HEP to improve mobility in LUE required for use during ADL completion.   Goal status: IN PROGRESS  2.  Pt will increase LUE P/ROM by 50+ degrees to improve ability to use LUE during dressing tasks with minimal compensatory techniques.   Goal  status: MET  3.  Pt will increase LUE strength to 3+/5 to improve ability to reach for items at waist to chest height during bathing and grooming tasks.   Goal status: IN PROGRESS   LONG TERM GOALS: Target date: 01/11/24  Pt will decrease pain in LUE to 3/10 or less to improve ability to sleep for 2+ consecutive hours without waking due to pain.   Goal status: IN PROGRESS  2.  Pt will decrease LUE fascial restrictions to min amounts or less to improve mobility required for functional reaching tasks.   Goal status: IN PROGRESS  3.  Pt will increase LUE A/ROM by 75+ degrees to improve ability to use LUE when reaching overhead or behind back during dressing and bathing tasks.   Goal status: IN PROGRESS  4.  Pt will increase LUE strength to 4+/5 or greater to improve ability to use LUE when lifting or carrying items during meal preparation/housework/yardwork tasks.   Goal status: IN PROGRESS  5.  Pt will return to highest level of function using LUE as non-dominant during functional task completion.   Goal status: IN PROGRESS   ASSESSMENT:  CLINICAL  IMPRESSION: Pt reports feeling well today. Min fascial restrictions palpated during manual techniques. Pt progressing to all A/ROM today, working towards improved functional reach. Completing reaching tasks to progress towards LUE use during functional tasks at home. Added scapular theraband retraction and overhead lacing today. Pt with mod fatigue, rest breaks offered as needed. Verbal cuing for form and technique, min fatigue at end of session. HEP updated today.     PERFORMANCE DEFICITS: in functional skills including in functional skills including ADLs, IADLs, coordination, tone, ROM, strength, pain, fascial restrictions, muscle spasms, and UE functional use     PLAN:  OT FREQUENCY: 2x/week  OT DURATION: 8 weeks  PLANNED INTERVENTIONS: 97168 OT Re-evaluation, 97535 self care/ADL training, 02889 therapeutic exercise, 97530 therapeutic activity, 97112 neuromuscular re-education, 97140 manual therapy, 97014 electrical stimulation unattended, patient/family education, and DME and/or AE instructions  CONSULTED AND AGREED WITH PLAN OF CARE: Patient  PLAN FOR NEXT SESSION: manual techniques prn, A/ROM, shoulder stretches, functional reaching, follow up on HEP    [employer], OTR/L  [phone] 01/01/2024, 1:41 PM

## 2024-01-01 NOTE — Patient Instructions (Signed)

## 2024-01-06 ENCOUNTER — Ambulatory Visit (HOSPITAL_COMMUNITY): Admitting: Occupational Therapy

## 2024-01-06 ENCOUNTER — Encounter (HOSPITAL_COMMUNITY): Payer: Self-pay | Admitting: Occupational Therapy

## 2024-01-06 DIAGNOSIS — R29898 Other symptoms and signs involving the musculoskeletal system: Secondary | ICD-10-CM

## 2024-01-06 DIAGNOSIS — M25512 Pain in left shoulder: Secondary | ICD-10-CM | POA: Diagnosis not present

## 2024-01-06 DIAGNOSIS — M25612 Stiffness of left shoulder, not elsewhere classified: Secondary | ICD-10-CM

## 2024-01-06 NOTE — Therapy (Signed)
 OUTPATIENT OCCUPATIONAL THERAPY ORTHO TREATMENT  [name] MRN: [id number] DOB:1970/09/20, 53 y.o., female Today's Date: 01/06/2024      END OF SESSION:  OT End of Session - 01/06/24 1304     Visit Number 14    Number of Visits 16    Date for OT Re-Evaluation 01/11/24    Authorization Type 1) [employer] Dual Complete Medicare 2) [place] [employer]    Authorization Time Period no auth required    Progress Note Due on Visit 20    OT Start Time 1256    OT Stop Time 1335    OT Time Calculation (min) 39 min    Activity Tolerance Patient tolerated treatment well    Behavior During Therapy WFL for tasks assessed/performed                       Past Medical History:  Diagnosis Date   Asthma    seasonal   Bruising, spontaneous 12/08/2013   Cancer (HCC)    cervical cancer - had hysterectomy   Chronic back pain    Chronic back pain    DDD (degenerative disc disease), lumbar    DDD (degenerative disc disease), lumbar    Dysrhythmia    tachycardia   Fibromyalgia    Headache(784.0)    migraines   History of kidney stones    Ovarian cyst    Partial tear subscapularis tendon 12/27/2011   Scoliosis    Seizures (HCC)    started 9/12-   Past Surgical History:  Procedure Laterality Date   ABDOMINAL HYSTERECTOMY  2012   BACK SURGERY  1989   scoliosis throsic-rods   CESAREAN SECTION     CHOLECYSTECTOMY N/A 11/17/2012   Procedure: LAPAROSCOPIC CHOLECYSTECTOMY WITH INTRAOPERATIVE CHOLANGIOGRAM;  Surgeon: Lynda Leos, MD;  Location: [facility];  Service: General;  Laterality: N/A;   COLONOSCOPY WITH PROPOFOL  N/A 04/21/2020   Procedure: COLONOSCOPY WITH PROPOFOL ;  Surgeon: Rollin Dover, MD;  Location: [facility];  Service: Endoscopy;  Laterality: N/A;   ENDOSCOPIC RETROGRADE CHOLANGIOPANCREATOGRAPHY (ERCP) WITH PROPOFOL  N/A 06/11/2019   Procedure: ENDOSCOPIC RETROGRADE CHOLANGIOPANCREATOGRAPHY (ERCP) WITH PROPOFOL ;  Surgeon: Rollin Dover, MD;  Location: [facility];  Service: Endoscopy;  Laterality: N/A;   ERCP N/A 05/07/2013   Procedure: ENDOSCOPIC RETROGRADE CHOLANGIOPANCREATOGRAPHY (ERCP);  Surgeon: Dover JONETTA Rollin, MD;  Location: [facility];  Service: Endoscopy;  Laterality: N/A;  start ercp first, if canulation fails switch to EUS    ESOPHAGOGASTRODUODENOSCOPY (EGD) WITH PROPOFOL  N/A 05/15/2019   Procedure: ESOPHAGOGASTRODUODENOSCOPY (EGD) WITH PROPOFOL ;  Surgeon: Harvey Margo CROME, MD;  Location: [facility];  Service: Endoscopy;  Laterality: N/A;   ESOPHAGOGASTRODUODENOSCOPY (EGD) WITH PROPOFOL  N/A 03/03/2020   Procedure: ESOPHAGOGASTRODUODENOSCOPY (EGD) WITH PROPOFOL ;  Surgeon: Rollin Dover, MD;  Location: [facility];  Service: Endoscopy;  Laterality: N/A;   ESOPHAGOGASTRODUODENOSCOPY (EGD) WITH PROPOFOL  N/A 04/21/2020   Procedure: ESOPHAGOGASTRODUODENOSCOPY (EGD) WITH PROPOFOL ;  Surgeon: Rollin Dover, MD;  Location: [facility];  Service: Endoscopy;  Laterality: N/A;   EUS N/A 11/03/2012   Procedure: UPPER ENDOSCOPIC ULTRASOUND (EUS) LINEAR;  Surgeon: Dover JONETTA Rollin, MD;  Location: [facility];  Service: Endoscopy;  Laterality: N/A;   left arm     NECK SURGERY     POLYPECTOMY  04/21/2020   Procedure: POLYPECTOMY;  Surgeon: Rollin Dover, MD;  Location: [facility];  Service: Endoscopy;;   REMOVAL OF STONES  06/11/2019   Procedure: REMOVAL OF STONES;  Surgeon: Rollin Dover, MD;  Location: [facility];  Service:  Endoscopy;;   TOOTH EXTRACTION N/A 05/24/2022   Procedure: DENTAL RESTORATION/EXTRACTIONS;  Surgeon: Sheryle Hamilton, DMD;  Location: [facility];  Service: Oral Surgery;  Laterality: N/A;   TOTAL HIP ARTHROPLASTY Left 11/17/2022   Procedure: TOTAL HIP ARTHROPLASTY ANTERIOR APPROACH;  Surgeon: Beverley Evalene BIRCH, MD;  Location: [facility];  Service: Orthopedics;  Laterality: Left;   TOTAL SHOULDER ARTHROPLASTY Left 11/04/2023   Procedure: ARTHROPLASTY, SHOULDER, TOTAL;  Surgeon: Josefina Chew, MD;  Location: [facility];  Service: Orthopedics;   Laterality: Left;   TUBAL LIGATION     UPPER ESOPHAGEAL ENDOSCOPIC ULTRASOUND (EUS) N/A 06/11/2019   Procedure: UPPER ESOPHAGEAL ENDOSCOPIC ULTRASOUND (EUS);  Surgeon: Rollin Dover, MD;  Location: [facility];  Service: Endoscopy;  Laterality: N/A;   Patient Active Problem List   Diagnosis Date Noted   S/P reverse total shoulder arthroplasty, left 11/04/2023   Vertigo 05/28/2023   Palpitations 12/05/2022   Transaminitis 11/16/2022   Hip fracture (HCC) 11/15/2022   GERD (gastroesophageal reflux disease) 11/15/2022   Tobacco use 11/15/2022   Idiopathic hypotension 07/19/2022   Slurred speech 07/19/2022   Underweight 07/19/2022   Hypotension 07/19/2022   Seizure disorder (HCC) 07/18/2022   Chronic migraine w/o aura w/o status migrainosus, not intractable 07/18/2022   Cerebral cavernoma 07/18/2022   Acute GI bleed due to bleeding pyloric/gastric ulcer 05/15/2019   PUD (peptic ulcer disease)-EGD 05/15/2019 with bleeding pyloric/gastric ulcer, nonbleeding duodenal ulcers and gastritis    Hematemesis with nausea    Migraine headache 09/25/2015   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   Bilateral occipital neuralgia 01/02/2015   Migraine 01/02/2015   Chest pain of uncertain etiology 11/29/2014   DDD (degenerative disc disease), cervical 10/27/2014   DDD (degenerative disc disease), 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   DDD (degenerative disc disease), lumbosacral 10/27/2014   Hypokalemia 05/07/2013   Partial tear subscapularis tendon 12/27/2011    PCP: Dr. Jerilynn Carnes REFERRING PROVIDER: Dr. Chew Josefina  ONSET DATE: 11/04/23  REFERRING DIAG: s/p left reverse TSA  THERAPY DIAG:  Acute pain of left shoulder  Stiffness of left shoulder, not elsewhere classified  Other symptoms and signs involving the musculoskeletal system  Rationale for Evaluation and Treatment: Rehabilitation  SUBJECTIVE:   SUBJECTIVE STATEMENT: S: It's doing pretty good.  PRECAUTIONS:  Shoulder-see protocol  WEIGHT BEARING RESTRICTIONS: Yes NWB  PAIN:  Are you having pain? No  FALLS: Has patient fallen in last 6 months? No  PLOF: Independent  PATIENT GOALS: To have less pain and more mobility.  NEXT MD VISIT: 01/16/24  OBJECTIVE:   HAND DOMINANCE: Right  ADLs: Overall ADLs: Pt is unable to use the LUE for any ADLs at this time. Pt reports difficulty with sleeping, waking up a lot, sleeping in the recliner, tried the bed last night with a little better sleep. Pt is unable to reach overhead or behind back, cannot lift items.   FUNCTIONAL OUTCOME MEASURES: Upper Extremity Functional Scale (UEFS): 8/80=10%  UPPER EXTREMITY ROM:       Assessed in supine, er/IR adducted  Passive ROM Left eval Left 12/11/23  Shoulder flexion 61 129  Shoulder abduction 62 131  Shoulder internal rotation 90 90  Shoulder external rotation 0 50  (Blank rows = not tested)    UPPER EXTREMITY MMT:     Not assessed due to protocol & pain  MMT Left eval  Shoulder flexion   Shoulder abduction   Shoulder internal rotation   Shoulder external rotation   (Blank rows = not tested)   SENSATION:  Pt reports intermittent numbness in the elbow  OBSERVATIONS: pt with mod fascial restrictions along upper arm, anterior shoulder, trapezius and scapular regions   TODAY'S TREATMENT:                                                                                                                              DATE:    01/06/24 -P/ROM: supine, shoulder flexion, abduction, internal/external rotation, abduction, 5 reps -A/ROM: supine-protraction, flexion, er, horizontal abduction, abduction, 12 reps -Proximal shoulder strengthening: supine-paddles, criss cross, circles each direction, 12 reps -A/ROM: standing-protraction, flexion, er, horizontal abduction, abduction, 12 reps -Shoulder stretches: flexion, doorway stretch, 3x10 holds -Proximal shoulder strengthening on doorway with  washcloth, shoulder at 90 degrees flexion -Functional reaching: pt placing 10 cones on middle shelf of overhead cabinet in flexion, removing in abduction -Scapular Theraband: red-row, extension, retraction, 10 reps -Overhead lacing: seated, lacing from top down then reversing -UBE: level 2, 2' forward, 2' reverse, pace: 6.5  01/01/24 -Manual techniques: to upper arm, pectoralis region, and trapezius; mod fascial restrictions- manual completed to reduce fascial restrictions, decrease pain, and improve ROM  -P/ROM: supine, shoulder flexion, abduction, internal/external rotation, abduction, 5 reps -A/ROM: supine-protraction, flexion, er, horizontal abduction, abduction, 12 reps -Proximal shoulder strengthening: supine-paddles, criss cross, circles each direction, 12 reps -A/ROM: standing-protraction, flexion, er, horizontal abduction, abduction, 10 reps -Functional reaching: pt placing 10 cones on middle shelf of overhead cabinet in flexion, removing in abduction -Scapular Theraband: red-row, extension, retraction, 10 reps -Overhead lacing: seated, lacing from top down then reversing -UBE: level 1, 3' forward, 3' reverse, pace: 4.0  12/30/23 -Manual techniques: to upper arm, pectoralis region, and trapezius; mod fascial restrictions- manual completed to reduce fascial restrictions, decrease pain, and improve ROM  -P/ROM: supine, shoulder flexion, abduction, internal/external rotation, abduction, 5 reps -A/ROM: supine-protraction, er, horizontal abduction, abduction, 10 reps -AA/ROM: supine-flexion, 10 reps -Proximal shoulder strengthening: supine-paddles, criss cross, circles each direction, 10 reps -AA/ROM: standing-protraction, flexion, er, horizontal abduction, abduction, 12 reps -Wall wash: 1'  -Thumb tacks: shoulder height, 1'  -Functional reaching: pt placing 10 cones on middle shelf of overhead cabinet in flexion, removing in abduction -Theraband: red-row, extension, 10 reps -UBE: level  1, 3' forward, 3' reverse, pace: 4.0     PATIENT EDUCATION: Education details: A/ROM Person educated: Patient Education method: Programmer, multimedia, Demonstration, and Handouts Education comprehension: verbalized understanding and returned demonstration  HOME EXERCISE PROGRAM: Eval: pendulums, table slides (no abduction) wrist A/ROM, elbow self-ROM 12/04/23: pulleys 3x/day, for 1-2' each 12/11/23: AA/ROM 01/01/24: A/ROM  GOALS: Goals reviewed with patient? Yes   SHORT TERM GOALS: Target date: 12/12/23  Pt will be provided with and educated on HEP to improve mobility in LUE required for use during ADL completion.   Goal status: IN PROGRESS  2.  Pt will increase LUE P/ROM by 50+ degrees to improve ability to use LUE during dressing tasks with minimal compensatory techniques.   Goal status: MET  3.  Pt will  increase LUE strength to 3+/5 to improve ability to reach for items at waist to chest height during bathing and grooming tasks.   Goal status: IN PROGRESS   LONG TERM GOALS: Target date: 01/11/24  Pt will decrease pain in LUE to 3/10 or less to improve ability to sleep for 2+ consecutive hours without waking due to pain.   Goal status: IN PROGRESS  2.  Pt will decrease LUE fascial restrictions to min amounts or less to improve mobility required for functional reaching tasks.   Goal status: IN PROGRESS  3.  Pt will increase LUE A/ROM by 75+ degrees to improve ability to use LUE when reaching overhead or behind back during dressing and bathing tasks.   Goal status: IN PROGRESS  4.  Pt will increase LUE strength to 4+/5 or greater to improve ability to use LUE when lifting or carrying items during meal preparation/housework/yardwork tasks.   Goal status: IN PROGRESS  5.  Pt will return to highest level of function using LUE as non-dominant during functional task completion.   Goal status: IN PROGRESS   ASSESSMENT:  CLINICAL IMPRESSION: Pt reports feeling good today, no  pain in her shoulder today, reports HEP is going well. Pt with min fascial restrictions on palpation, min manual techniques then progressed to therapeutic exercises. Continued with A/ROM in supine and standing, pt achieving ROM to approximately 75% in supine flexion, WFL all additional planes; in standing flexion to approximately 65%, abduction to approximately 70%. Added shoulder stretches and continued with functional reaching and scapular stability required for functional use during ADLs in the home.     PERFORMANCE DEFICITS: in functional skills including in functional skills including ADLs, IADLs, coordination, tone, ROM, strength, pain, fascial restrictions, muscle spasms, and UE functional use     PLAN:  OT FREQUENCY: 2x/week  OT DURATION: 8 weeks  PLANNED INTERVENTIONS: 97168 OT Re-evaluation, 97535 self care/ADL training, 02889 therapeutic exercise, 97530 therapeutic activity, 97112 neuromuscular re-education, 97140 manual therapy, 97014 electrical stimulation unattended, patient/family education, and DME and/or AE instructions  CONSULTED AND AGREED WITH PLAN OF CARE: Patient  PLAN FOR NEXT SESSION: manual techniques prn, A/ROM, shoulder stretches, functional reaching, update HEP for scapular theraband    Sonny Cory, OTR/L  [phone] 01/06/2024, 1:36 PM

## 2024-01-08 ENCOUNTER — Ambulatory Visit (HOSPITAL_COMMUNITY): Admitting: Occupational Therapy

## 2024-01-08 ENCOUNTER — Encounter (HOSPITAL_COMMUNITY): Payer: Self-pay | Admitting: Occupational Therapy

## 2024-01-08 DIAGNOSIS — R29898 Other symptoms and signs involving the musculoskeletal system: Secondary | ICD-10-CM

## 2024-01-08 DIAGNOSIS — M25512 Pain in left shoulder: Secondary | ICD-10-CM

## 2024-01-08 DIAGNOSIS — M25612 Stiffness of left shoulder, not elsewhere classified: Secondary | ICD-10-CM

## 2024-01-08 NOTE — Therapy (Signed)
 OUTPATIENT OCCUPATIONAL THERAPY ORTHO TREATMENT REASSESSMENT/RECERTIFICATION  [name] MRN: [id number] DOB:1970-11-03, 53 y.o., female Today's Date: 01/08/2024  Progress Note Reporting Period 12/11/23 to 01/08/24  See note below for Objective Data and Assessment of Progress/Goals.    END OF SESSION:  OT End of Session - 01/08/24 1429     Visit Number 15    Number of Visits 23    Date for OT Re-Evaluation 02/06/24    Authorization Type 1) [employer] Dual Complete Medicare 2) [place] [employer]    Authorization Time Period no auth required    Progress Note Due on Visit 20    OT Start Time 1349    OT Stop Time 1429    OT Time Calculation (min) 40 min    Activity Tolerance Patient tolerated treatment well    Behavior During Therapy WFL for tasks assessed/performed          Past Medical History:  Diagnosis Date   Asthma    seasonal   Bruising, spontaneous 12/08/2013   Cancer (HCC)    cervical cancer - had hysterectomy   Chronic back pain    Chronic back pain    DDD (degenerative disc disease), lumbar    DDD (degenerative disc disease), lumbar    Dysrhythmia    tachycardia   Fibromyalgia    Headache(784.0)    migraines   History of kidney stones    Ovarian cyst    Partial tear subscapularis tendon 12/27/2011   Scoliosis    Seizures (HCC)    started 9/12-   Past Surgical History:  Procedure Laterality Date   ABDOMINAL HYSTERECTOMY  2012   BACK SURGERY  1989   scoliosis throsic-rods   CESAREAN SECTION     CHOLECYSTECTOMY N/A 11/17/2012   Procedure: LAPAROSCOPIC CHOLECYSTECTOMY WITH INTRAOPERATIVE CHOLANGIOGRAM;  Surgeon: Lynda Leos, MD;  Location: [facility];  Service: General;  Laterality: N/A;   COLONOSCOPY WITH PROPOFOL  N/A 04/21/2020   Procedure: COLONOSCOPY WITH PROPOFOL ;  Surgeon: Rollin Dover, MD;  Location: [facility];  Service: Endoscopy;  Laterality: N/A;   ENDOSCOPIC RETROGRADE CHOLANGIOPANCREATOGRAPHY (ERCP) WITH PROPOFOL  N/A 06/11/2019    Procedure: ENDOSCOPIC RETROGRADE CHOLANGIOPANCREATOGRAPHY (ERCP) WITH PROPOFOL ;  Surgeon: Rollin Dover, MD;  Location: [facility];  Service: Endoscopy;  Laterality: N/A;   ERCP N/A 05/07/2013   Procedure: ENDOSCOPIC RETROGRADE CHOLANGIOPANCREATOGRAPHY (ERCP);  Surgeon: Dover JONETTA Rollin, MD;  Location: [facility];  Service: Endoscopy;  Laterality: N/A;  start ercp first, if canulation fails switch to EUS    ESOPHAGOGASTRODUODENOSCOPY (EGD) WITH PROPOFOL  N/A 05/15/2019   Procedure: ESOPHAGOGASTRODUODENOSCOPY (EGD) WITH PROPOFOL ;  Surgeon: Harvey Margo CROME, MD;  Location: [facility];  Service: Endoscopy;  Laterality: N/A;   ESOPHAGOGASTRODUODENOSCOPY (EGD) WITH PROPOFOL  N/A 03/03/2020   Procedure: ESOPHAGOGASTRODUODENOSCOPY (EGD) WITH PROPOFOL ;  Surgeon: Rollin Dover, MD;  Location: [facility];  Service: Endoscopy;  Laterality: N/A;   ESOPHAGOGASTRODUODENOSCOPY (EGD) WITH PROPOFOL  N/A 04/21/2020   Procedure: ESOPHAGOGASTRODUODENOSCOPY (EGD) WITH PROPOFOL ;  Surgeon: Rollin Dover, MD;  Location: [facility];  Service: Endoscopy;  Laterality: N/A;   EUS N/A 11/03/2012   Procedure: UPPER ENDOSCOPIC ULTRASOUND (EUS) LINEAR;  Surgeon: Dover JONETTA Rollin, MD;  Location: [facility];  Service: Endoscopy;  Laterality: N/A;   left arm     NECK SURGERY     POLYPECTOMY  04/21/2020   Procedure: POLYPECTOMY;  Surgeon: Rollin Dover, MD;  Location: [facility];  Service: Endoscopy;;   REMOVAL OF STONES  06/11/2019   Procedure: REMOVAL OF STONES;  Surgeon: Rollin Dover, MD;  Location: [facility];  Service: Endoscopy;;   TOOTH EXTRACTION N/A 05/24/2022   Procedure: DENTAL RESTORATION/EXTRACTIONS;  Surgeon: Sheryle Hamilton, DMD;  Location: [facility];  Service: Oral Surgery;  Laterality: N/A;   TOTAL HIP ARTHROPLASTY Left 11/17/2022   Procedure: TOTAL HIP ARTHROPLASTY ANTERIOR APPROACH;  Surgeon: Beverley Evalene BIRCH, MD;  Location: [facility];  Service: Orthopedics;  Laterality: Left;   TOTAL SHOULDER ARTHROPLASTY Left  11/04/2023   Procedure: ARTHROPLASTY, SHOULDER, TOTAL;  Surgeon: Josefina Chew, MD;  Location: [facility];  Service: Orthopedics;  Laterality: Left;   TUBAL LIGATION     UPPER ESOPHAGEAL ENDOSCOPIC ULTRASOUND (EUS) N/A 06/11/2019   Procedure: UPPER ESOPHAGEAL ENDOSCOPIC ULTRASOUND (EUS);  Surgeon: Rollin Dover, MD;  Location: [facility];  Service: Endoscopy;  Laterality: N/A;   Patient Active Problem List   Diagnosis Date Noted   S/P reverse total shoulder arthroplasty, left 11/04/2023   Vertigo 05/28/2023   Palpitations 12/05/2022   Transaminitis 11/16/2022   Hip fracture (HCC) 11/15/2022   GERD (gastroesophageal reflux disease) 11/15/2022   Tobacco use 11/15/2022   Idiopathic hypotension 07/19/2022   Slurred speech 07/19/2022   Underweight 07/19/2022   Hypotension 07/19/2022   Seizure disorder (HCC) 07/18/2022   Chronic migraine w/o aura w/o status migrainosus, not intractable 07/18/2022   Cerebral cavernoma 07/18/2022   Acute GI bleed due to bleeding pyloric/gastric ulcer 05/15/2019   PUD (peptic ulcer disease)-EGD 05/15/2019 with bleeding pyloric/gastric ulcer, nonbleeding duodenal ulcers and gastritis    Hematemesis with nausea    Migraine headache 09/25/2015   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   Bilateral occipital neuralgia 01/02/2015   Migraine 01/02/2015   Chest pain of uncertain etiology 11/29/2014   DDD (degenerative disc disease), cervical 10/27/2014   DDD (degenerative disc disease), 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   DDD (degenerative disc disease), lumbosacral 10/27/2014   Hypokalemia 05/07/2013   Partial tear subscapularis tendon 12/27/2011    PCP: Dr. Jerilynn Carnes REFERRING PROVIDER: Dr. Chew Josefina  ONSET DATE: 11/04/23  REFERRING DIAG: s/p left reverse TSA  THERAPY DIAG:  Acute pain of left shoulder  Stiffness of left shoulder, not elsewhere classified  Other symptoms and signs involving the musculoskeletal system  Rationale for Evaluation  and Treatment: Rehabilitation  SUBJECTIVE:   SUBJECTIVE STATEMENT: S: It's doing pretty good.  PRECAUTIONS: Shoulder-see protocol  WEIGHT BEARING RESTRICTIONS: Yes NWB  PAIN:  Are you having pain? No   FALLS: Has patient fallen in last 6 months? No  PLOF: Independent  PATIENT GOALS: To have less pain and more mobility.  NEXT MD VISIT: 01/16/24  OBJECTIVE:   HAND DOMINANCE: Right  ADLs: Overall ADLs: Pt is unable to use the LUE for any ADLs at this time. Pt reports difficulty with sleeping, waking up a lot, sleeping in the recliner, tried the bed last night with a little better sleep. Pt is unable to reach overhead or behind back, cannot lift items.   FUNCTIONAL OUTCOME MEASURES: Upper Extremity Functional Scale (UEFS): 8/80=10% 01/08/24: 29/80=36.6%   UPPER EXTREMITY ROM:       Assessed in supine, er/IR adducted  Passive ROM Left eval Left 12/11/23 Left 01/08/24  Shoulder flexion 61 129 147  Shoulder abduction 62 131 144  Shoulder internal rotation 90 90 90  Shoulder external rotation 0 50 80  (Blank rows = not tested)    Assessed in seated, er/IR adducted  Active ROM Left eval Left 01/08/24  Shoulder flexion  131  Shoulder abduction  144  Shoulder internal rotation  90  Shoulder  external rotation  56  (Blank rows = not tested)  UPPER EXTREMITY MMT:     Not assessed due to protocol & pain  MMT Left eval Left 01/08/24  Shoulder flexion  4+/5  Shoulder abduction  4/5  Shoulder internal rotation  4-/5  Shoulder external rotation  4+/5  (Blank rows = not tested)   SENSATION: Pt reports intermittent numbness in the elbow  OBSERVATIONS: pt with mod fascial restrictions along upper arm, anterior shoulder, trapezius and scapular regions   TODAY'S TREATMENT:                                                                                                                              DATE:    01/08/24 -Manual techniques: to upper arm, pectoralis  region, and trapezius; mod fascial restrictions- manual completed to reduce fascial restrictions, decrease pain, and improve ROM -P/ROM: supine, shoulder flexion, abduction, internal/external rotation, abduction, x6 -A/ROM: seated, protraction, flexion, er, horizontal abduction, abduction, x12 -Shoulder stretches: flexion, doorway stretch, 3x10 holds -Discussion about use of arm at home with simple cooking and cleaning tasks, importance of stretching to improve mobility functionally at home  01/06/24 -P/ROM: supine, shoulder flexion, abduction, internal/external rotation, abduction, 5 reps -A/ROM: supine-protraction, flexion, er, horizontal abduction, abduction, 12 reps -Proximal shoulder strengthening: supine-paddles, criss cross, circles each direction, 12 reps -A/ROM: standing-protraction, flexion, er, horizontal abduction, abduction, 12 reps -Shoulder stretches: flexion, doorway stretch, 3x10 holds -Proximal shoulder strengthening on doorway with washcloth, shoulder at 90 degrees flexion -Functional reaching: pt placing 10 cones on middle shelf of overhead cabinet in flexion, removing in abduction -Scapular Theraband: red-row, extension, retraction, 10 reps -Overhead lacing: seated, lacing from top down then reversing -UBE: level 2, 2' forward, 2' reverse, pace: 6.5  01/01/24 -Manual techniques: to upper arm, pectoralis region, and trapezius; mod fascial restrictions- manual completed to reduce fascial restrictions, decrease pain, and improve ROM  -P/ROM: supine, shoulder flexion, abduction, internal/external rotation, abduction, 5 reps -A/ROM: supine-protraction, flexion, er, horizontal abduction, abduction, 12 reps -Proximal shoulder strengthening: supine-paddles, criss cross, circles each direction, 12 reps -A/ROM: standing-protraction, flexion, er, horizontal abduction, abduction, 10 reps -Functional reaching: pt placing 10 cones on middle shelf of overhead cabinet in flexion,  removing in abduction -Scapular Theraband: red-row, extension, retraction, 10 reps -Overhead lacing: seated, lacing from top down then reversing -UBE: level 1, 3' forward, 3' reverse, pace: 4.0    PATIENT EDUCATION: Education details: Reviewed HEP Person educated: Patient Education method: Explanation, Demonstration, and Handouts Education comprehension: verbalized understanding and returned demonstration  HOME EXERCISE PROGRAM: Eval: pendulums, table slides (no abduction) wrist A/ROM, elbow self-ROM 12/04/23: pulleys 3x/day, for 1-2' each 12/11/23: AA/ROM 01/01/24: A/ROM  GOALS: Goals reviewed with patient? Yes   SHORT TERM GOALS: Target date: 12/12/23  Pt will be provided with and educated on HEP to improve mobility in LUE required for use during ADL completion.   Goal status: IN PROGRESS  2.  Pt will increase LUE P/ROM by  50+ degrees to improve ability to use LUE during dressing tasks with minimal compensatory techniques.   Goal status: MET  3.  Pt will increase LUE strength to 3+/5 to improve ability to reach for items at waist to chest height during bathing and grooming tasks.   Goal status: IN PROGRESS   LONG TERM GOALS: Target date: 01/11/24  Pt will decrease pain in LUE to 3/10 or less to improve ability to sleep for 2+ consecutive hours without waking due to pain.   Goal status: IN PROGRESS  2.  Pt will decrease LUE fascial restrictions to min amounts or less to improve mobility required for functional reaching tasks.   Goal status: IN PROGRESS  3.  Pt will increase LUE A/ROM by 75+ degrees to improve ability to use LUE when reaching overhead or behind back during dressing and bathing tasks.   Goal status: IN PROGRESS  4.  Pt will increase LUE strength to 4+/5 or greater to improve ability to use LUE when lifting or carrying items during meal preparation/housework/yardwork tasks.   Goal status: IN PROGRESS  5.  Pt will return to highest level of function  using LUE as non-dominant during functional task completion.   Goal status: IN PROGRESS   ASSESSMENT:  CLINICAL IMPRESSION: Pt completed Reassessment this session, where she demonstrated improved ROM and strength. She is at least 65-70% of near full mobility. She and OT discussed importance of mobility and completing more IADL's to continue with functional improvements at home. Verbal and tactile cuing for positioning and technique throughout session.    PERFORMANCE DEFICITS: in functional skills including in functional skills including ADLs, IADLs, coordination, tone, ROM, strength, pain, fascial restrictions, muscle spasms, and UE functional use     PLAN:  OT FREQUENCY: 2x/week  OT DURATION: 8 weeks  PLANNED INTERVENTIONS: 97168 OT Re-evaluation, 97535 self care/ADL training, 02889 therapeutic exercise, 97530 therapeutic activity, 97112 neuromuscular re-education, 97140 manual therapy, 97014 electrical stimulation unattended, patient/family education, and DME and/or AE instructions  CONSULTED AND AGREED WITH PLAN OF CARE: Patient  PLAN FOR NEXT SESSION: manual techniques prn, A/ROM, shoulder stretches, functional reaching, update HEP for scapular fleurette Valentin Nightingale, OTR/L [phone] 01/08/2024, 4:03 PM

## 2024-01-09 ENCOUNTER — Other Ambulatory Visit: Payer: Self-pay

## 2024-01-09 MED ORDER — FUROSEMIDE 20 MG PO TABS: 20.0000 mg | ORAL_TABLET | Freq: Every day | ORAL | 1 refills | Status: DC

## 2024-01-13 ENCOUNTER — Ambulatory Visit (HOSPITAL_COMMUNITY): Admitting: Occupational Therapy

## 2024-01-13 ENCOUNTER — Encounter (HOSPITAL_COMMUNITY): Payer: Self-pay | Admitting: Occupational Therapy

## 2024-01-13 DIAGNOSIS — M25612 Stiffness of left shoulder, not elsewhere classified: Secondary | ICD-10-CM

## 2024-01-13 DIAGNOSIS — M25512 Pain in left shoulder: Secondary | ICD-10-CM | POA: Diagnosis not present

## 2024-01-13 DIAGNOSIS — R29898 Other symptoms and signs involving the musculoskeletal system: Secondary | ICD-10-CM

## 2024-01-13 NOTE — Addendum Note (Signed)
 Addended by: THELBERT VALENTIN BRAVO on: 01/13/2024 04:32 PM   Modules accepted: Orders

## 2024-01-13 NOTE — Therapy (Signed)
 OUTPATIENT OCCUPATIONAL THERAPY ORTHO TREATMENT   Patient Name: Lakedra Washington MRN: [id number] DOB:Oct 31, 1970, 53 y.o., female Today's Date: 01/13/2024   END OF SESSION:  OT End of Session - 01/13/24 1457     Visit Number 16    Number of Visits 23    Date for OT Re-Evaluation 02/06/24    Authorization Type 1) [employer] Dual Complete Medicare 2) [place] Medicaid    Authorization Time Period no auth required    Progress Note Due on Visit 20    OT Start Time 1341    OT Stop Time 1420    OT Time Calculation (min) 39 min    Activity Tolerance Patient tolerated treatment well    Behavior During Therapy WFL for tasks assessed/performed           Past Medical History:  Diagnosis Date   Asthma    seasonal   Bruising, spontaneous 12/08/2013   Cancer (HCC)    cervical cancer - had hysterectomy   Chronic back pain    Chronic back pain    DDD (degenerative disc disease), lumbar    DDD (degenerative disc disease), lumbar    Dysrhythmia    tachycardia   Fibromyalgia    Headache(784.0)    migraines   History of kidney stones    Ovarian cyst    Partial tear subscapularis tendon 12/27/2011   Scoliosis    Seizures (HCC)    started 9/12-   Past Surgical History:  Procedure Laterality Date   ABDOMINAL HYSTERECTOMY  2012   BACK SURGERY  1989   scoliosis throsic-rods   CESAREAN SECTION     CHOLECYSTECTOMY N/A 11/17/2012   Procedure: LAPAROSCOPIC CHOLECYSTECTOMY WITH INTRAOPERATIVE CHOLANGIOGRAM;  Surgeon: Lynda Leos, MD;  Location: [facility];  Service: General;  Laterality: N/A;   COLONOSCOPY WITH PROPOFOL  N/A 04/21/2020   Procedure: COLONOSCOPY WITH PROPOFOL ;  Surgeon: Rollin Dover, MD;  Location: [facility];  Service: Endoscopy;  Laterality: N/A;   ENDOSCOPIC RETROGRADE CHOLANGIOPANCREATOGRAPHY (ERCP) WITH PROPOFOL  N/A 06/11/2019   Procedure: ENDOSCOPIC RETROGRADE CHOLANGIOPANCREATOGRAPHY (ERCP) WITH PROPOFOL ;  Surgeon: Rollin Dover, MD;  Location: [facility];  Service:  Endoscopy;  Laterality: N/A;   ERCP N/A 05/07/2013   Procedure: ENDOSCOPIC RETROGRADE CHOLANGIOPANCREATOGRAPHY (ERCP);  Surgeon: Dover JONETTA Rollin, MD;  Location: [facility];  Service: Endoscopy;  Laterality: N/A;  start ercp first, if canulation fails switch to EUS    ESOPHAGOGASTRODUODENOSCOPY (EGD) WITH PROPOFOL  N/A 05/15/2019   Procedure: ESOPHAGOGASTRODUODENOSCOPY (EGD) WITH PROPOFOL ;  Surgeon: Harvey Margo CROME, MD;  Location: [facility];  Service: Endoscopy;  Laterality: N/A;   ESOPHAGOGASTRODUODENOSCOPY (EGD) WITH PROPOFOL  N/A 03/03/2020   Procedure: ESOPHAGOGASTRODUODENOSCOPY (EGD) WITH PROPOFOL ;  Surgeon: Rollin Dover, MD;  [facility]: [facility];  Service: Endoscopy;  Laterality: N/A;   ESOPHAGOGASTRODUODENOSCOPY (EGD) WITH PROPOFOL  N/A 04/21/2020   Procedure: ESOPHAGOGASTRODUODENOSCOPY (EGD) WITH PROPOFOL ;  Surgeon: Rollin Dover, MD;  [facility]: [facility];  Service: Endoscopy;  Laterality: N/A;   EUS N/A 11/03/2012   Procedure: UPPER ENDOSCOPIC ULTRASOUND (EUS) LINEAR;  Surgeon: Dover JONETTA Rollin, MD;  Location: [facility];  Service: Endoscopy;  Laterality: N/A;   left arm     NECK SURGERY     POLYPECTOMY  04/21/2020   Procedure: POLYPECTOMY;  Surgeon: Rollin Dover, MD;  Location: [facility];  Service: Endoscopy;;   REMOVAL OF STONES  06/11/2019   Procedure: REMOVAL OF STONES;  Surgeon: Rollin Dover, MD;  Location: [facility];  Service: Endoscopy;;   TOOTH EXTRACTION N/A 05/24/2022   Procedure: DENTAL RESTORATION/EXTRACTIONS;  Surgeon:  Sheryle Hamilton, DMD;  Location: [facility];  Service: Oral Surgery;  Laterality: N/A;   TOTAL HIP ARTHROPLASTY Left 11/17/2022   Procedure: TOTAL HIP ARTHROPLASTY ANTERIOR APPROACH;  Surgeon: Beverley Evalene BIRCH, MD;  Location: [facility];  Service: Orthopedics;  Laterality: Left;   TOTAL SHOULDER ARTHROPLASTY Left 11/04/2023   Procedure: ARTHROPLASTY, SHOULDER, TOTAL;  Surgeon: Josefina Chew, MD;  Location: [facility];  Service: Orthopedics;  Laterality: Left;    TUBAL LIGATION     UPPER ESOPHAGEAL ENDOSCOPIC ULTRASOUND (EUS) N/A 06/11/2019   Procedure: UPPER ESOPHAGEAL ENDOSCOPIC ULTRASOUND (EUS);  Surgeon: Rollin Dover, MD;  Location: [facility];  Service: Endoscopy;  Laterality: N/A;   Patient Active Problem List   Diagnosis Date Noted   S/P reverse total shoulder arthroplasty, left 11/04/2023   Vertigo 05/28/2023   Palpitations 12/05/2022   Transaminitis 11/16/2022   Hip fracture (HCC) 11/15/2022   GERD (gastroesophageal reflux disease) 11/15/2022   Tobacco use 11/15/2022   Idiopathic hypotension 07/19/2022   Slurred speech 07/19/2022   Underweight 07/19/2022   Hypotension 07/19/2022   Seizure disorder (HCC) 07/18/2022   Chronic migraine w/o aura w/o status migrainosus, not intractable 07/18/2022   Cerebral cavernoma 07/18/2022   Acute GI bleed due to bleeding pyloric/gastric ulcer 05/15/2019   PUD (peptic ulcer disease)-EGD 05/15/2019 with bleeding pyloric/gastric ulcer, nonbleeding duodenal ulcers and gastritis    Hematemesis with nausea    Migraine headache 09/25/2015   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   Bilateral occipital neuralgia 01/02/2015   Migraine 01/02/2015   Chest pain of uncertain etiology 11/29/2014   DDD (degenerative disc disease), cervical 10/27/2014   DDD (degenerative disc disease), 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   DDD (degenerative disc disease), lumbosacral 10/27/2014   Hypokalemia 05/07/2013   Partial tear subscapularis tendon 12/27/2011    PCP: Dr. Jerilynn Carnes REFERRING PROVIDER: Dr. Chew Josefina  ONSET DATE: 11/04/23  REFERRING DIAG: s/p left reverse TSA  THERAPY DIAG:  Acute pain of left shoulder  Stiffness of left shoulder, not elsewhere classified  Other symptoms and signs involving the musculoskeletal system  Rationale for Evaluation and Treatment: Rehabilitation  SUBJECTIVE:   SUBJECTIVE STATEMENT: S: I've been doing my exercises.   PRECAUTIONS: Shoulder-see  protocol  WEIGHT BEARING RESTRICTIONS: Yes NWB  PAIN:  Are you having pain? No   FALLS: Has patient fallen in last 6 months? No  PLOF: Independent  PATIENT GOALS: To have less pain and more mobility.  NEXT MD VISIT: 01/16/24  OBJECTIVE:   HAND DOMINANCE: Right  ADLs: Overall ADLs: Pt is unable to use the LUE for any ADLs at this time. Pt reports difficulty with sleeping, waking up a lot, sleeping in the recliner, tried the bed last night with a little better sleep. Pt is unable to reach overhead or behind back, cannot lift items.   FUNCTIONAL OUTCOME MEASURES: Upper Extremity Functional Scale (UEFS): 8/80=10% 01/08/24: 29/80=36.6%   UPPER EXTREMITY ROM:       Assessed in supine, er/IR adducted  Passive ROM Left eval Left 12/11/23 Left 01/08/24  Shoulder flexion 61 129 147  Shoulder abduction 62 131 144  Shoulder internal rotation 90 90 90  Shoulder external rotation 0 50 80  (Blank rows = not tested)    Assessed in seated, er/IR adducted  Active ROM Left eval Left 01/08/24  Shoulder flexion  131  Shoulder abduction  144  Shoulder internal rotation  90  Shoulder external rotation  56  (Blank rows = not tested)  UPPER EXTREMITY MMT:  Not assessed due to protocol & pain  MMT Left eval Left 01/08/24  Shoulder flexion  4+/5  Shoulder abduction  4/5  Shoulder internal rotation  4-/5  Shoulder external rotation  4+/5  (Blank rows = not tested)   SENSATION: Pt reports intermittent numbness in the elbow  OBSERVATIONS: pt with mod fascial restrictions along upper arm, anterior shoulder, trapezius and scapular regions   TODAY'S TREATMENT:                                                                                                                              DATE:   01/13/24 -P/ROM: supine, shoulder flexion, abduction, internal/external rotation, abduction, 5 reps -A/ROM: supine-protraction, flexion, er, horizontal abduction, abduction, 12  reps -Proximal shoulder strengthening: supine-paddles, criss cross, circles each direction, 12 reps -A/ROM: standing-protraction, flexion, er, horizontal abduction, abduction, 12 reps -Shoulder stretches: flexion, doorway stretch, 3x20 holds -Proximal shoulder strengthening on doorway with washcloth, shoulder at 90 degrees flexion -Functional reaching: pt moving items to counter from middle shelf in kitchen, moving items from bottom shelf to middle shelf, then moving items from counter to bottom shelf.  -Scapular Theraband: red-row, extension, retraction, 10 reps -Overhead lacing: seated, lacing from top down then reversing -UBE: level 2, 2' forward, 2' reverse, pace: 6.5  01/08/24 -Manual techniques: to upper arm, pectoralis region, and trapezius; mod fascial restrictions- manual completed to reduce fascial restrictions, decrease pain, and improve ROM -P/ROM: supine, shoulder flexion, abduction, internal/external rotation, abduction, x6 -A/ROM: seated, protraction, flexion, er, horizontal abduction, abduction, x12 -Shoulder stretches: flexion, doorway stretch, 3x10 holds -Discussion about use of arm at home with simple cooking and cleaning tasks, importance of stretching to improve mobility functionally at home  01/06/24 -P/ROM: supine, shoulder flexion, abduction, internal/external rotation, abduction, 5 reps -A/ROM: supine-protraction, flexion, er, horizontal abduction, abduction, 12 reps -Proximal shoulder strengthening: supine-paddles, criss cross, circles each direction, 12 reps -A/ROM: standing-protraction, flexion, er, horizontal abduction, abduction, 12 reps -Shoulder stretches: flexion, doorway stretch, 3x10 holds -Proximal shoulder strengthening on doorway with washcloth, shoulder at 90 degrees flexion -Functional reaching: pt placing 10 cones on middle shelf of overhead cabinet in flexion, removing in abduction -Scapular Theraband: red-row, extension, retraction, 10  reps -Overhead lacing: seated, lacing from top down then reversing -UBE: level 2, 2' forward, 2' reverse, pace: 6.5     PATIENT EDUCATION: Education details: Reviewed HEP Person educated: Patient Education method: Explanation, Demonstration, and Handouts Education comprehension: verbalized understanding and returned demonstration  HOME EXERCISE PROGRAM: Eval: pendulums, table slides (no abduction) wrist A/ROM, elbow self-ROM 12/04/23: pulleys 3x/day, for 1-2' each 12/11/23: AA/ROM 01/01/24: A/ROM  GOALS: Goals reviewed with patient? Yes   SHORT TERM GOALS: Target date: 12/12/23  Pt will be provided with and educated on HEP to improve mobility in LUE required for use during ADL completion.   Goal status: IN PROGRESS  2.  Pt will increase LUE P/ROM by 50+ degrees to improve ability to use LUE during dressing tasks  with minimal compensatory techniques.   Goal status: MET  3.  Pt will increase LUE strength to 3+/5 to improve ability to reach for items at waist to chest height during bathing and grooming tasks.   Goal status: IN PROGRESS   LONG TERM GOALS: Target date: 01/11/24  Pt will decrease pain in LUE to 3/10 or less to improve ability to sleep for 2+ consecutive hours without waking due to pain.   Goal status: IN PROGRESS  2.  Pt will decrease LUE fascial restrictions to min amounts or less to improve mobility required for functional reaching tasks.   Goal status: IN PROGRESS  3.  Pt will increase LUE A/ROM by 75+ degrees to improve ability to use LUE when reaching overhead or behind back during dressing and bathing tasks.   Goal status: IN PROGRESS  4.  Pt will increase LUE strength to 4+/5 or greater to improve ability to use LUE when lifting or carrying items during meal preparation/housework/yardwork tasks.   Goal status: IN PROGRESS  5.  Pt will return to highest level of function using LUE as non-dominant during functional task completion.   Goal status:  IN PROGRESS   ASSESSMENT:  CLINICAL IMPRESSION: Pt reports she is completing her HEP. Continued with A/ROM and shoulder stretches, increasing stretch to 20 holds from 10 holds. Added functional reaching in kitchen,1 rest break required for fatigue. Session focusing on functional task completion and improving ROM during functional task completion. Verbal cuing for form and technique required.    PERFORMANCE DEFICITS: in functional skills including in functional skills including ADLs, IADLs, coordination, tone, ROM, strength, pain, fascial restrictions, muscle spasms, and UE functional use     PLAN:  OT FREQUENCY: 2x/week  OT DURATION: 8 weeks  PLANNED INTERVENTIONS: 97168 OT Re-evaluation, 97535 self care/ADL training, 02889 therapeutic exercise, 97530 therapeutic activity, 97112 neuromuscular re-education, 97140 manual therapy, 97014 electrical stimulation unattended, patient/family education, and DME and/or AE instructions  CONSULTED AND AGREED WITH PLAN OF CARE: Patient  PLAN FOR NEXT SESSION: manual techniques prn, A/ROM, shoulder stretches, functional reaching, update HEP for scapular theraband    Sonny Cory, OTR/L  [phone] 01/13/2024, 2:58 PM

## 2024-01-15 ENCOUNTER — Encounter (HOSPITAL_COMMUNITY): Payer: Self-pay | Admitting: Occupational Therapy

## 2024-01-15 ENCOUNTER — Ambulatory Visit (HOSPITAL_COMMUNITY): Admitting: Occupational Therapy

## 2024-01-15 DIAGNOSIS — M25612 Stiffness of left shoulder, not elsewhere classified: Secondary | ICD-10-CM

## 2024-01-15 DIAGNOSIS — M25512 Pain in left shoulder: Secondary | ICD-10-CM | POA: Diagnosis not present

## 2024-01-15 DIAGNOSIS — R29898 Other symptoms and signs involving the musculoskeletal system: Secondary | ICD-10-CM

## 2024-01-15 NOTE — Therapy (Signed)
 OUTPATIENT OCCUPATIONAL THERAPY ORTHO TREATMENT   [name] MRN: [id number] DOB:12/02/1970, 53 y.o., female Today's Date: 01/15/2024   END OF SESSION:  OT End of Session - 01/15/24 1404     Visit Number 17    Number of Visits 23    Date for OT Re-Evaluation 02/06/24    Authorization Type 1) [employer] Dual Complete Medicare 2) [place] Medicaid    Authorization Time Period no auth required    Progress Note Due on Visit 20    OT Start Time 1345    OT Stop Time 1429    OT Time Calculation (min) 44 min    Activity Tolerance Patient tolerated treatment well    Behavior During Therapy WFL for tasks assessed/performed            Past Medical History:  Diagnosis Date   Asthma    seasonal   Bruising, spontaneous 12/08/2013   Cancer (HCC)    cervical cancer - had hysterectomy   Chronic back pain    Chronic back pain    DDD (degenerative disc disease), lumbar    DDD (degenerative disc disease), lumbar    Dysrhythmia    tachycardia   Fibromyalgia    Headache(784.0)    migraines   History of kidney stones    Ovarian cyst    Partial tear subscapularis tendon 12/27/2011   Scoliosis    Seizures (HCC)    started 9/12-   Past Surgical History:  Procedure Laterality Date   ABDOMINAL HYSTERECTOMY  2012   BACK SURGERY  1989   scoliosis throsic-rods   CESAREAN SECTION     CHOLECYSTECTOMY N/A 11/17/2012   Procedure: LAPAROSCOPIC CHOLECYSTECTOMY WITH INTRAOPERATIVE CHOLANGIOGRAM;  Surgeon: Lynda Leos, MD;  Location: [facility];  Service: General;  Laterality: N/A;   COLONOSCOPY WITH PROPOFOL  N/A 04/21/2020   Procedure: COLONOSCOPY WITH PROPOFOL ;  Surgeon: Rollin Dover, MD;  Location: [facility];  Service: Endoscopy;  Laterality: N/A;   ENDOSCOPIC RETROGRADE CHOLANGIOPANCREATOGRAPHY (ERCP) WITH PROPOFOL  N/A 06/11/2019   Procedure: ENDOSCOPIC RETROGRADE CHOLANGIOPANCREATOGRAPHY (ERCP) WITH PROPOFOL ;  Surgeon: Rollin Dover, MD;  Location: [facility];  Service:  Endoscopy;  Laterality: N/A;   ERCP N/A 05/07/2013   Procedure: ENDOSCOPIC RETROGRADE CHOLANGIOPANCREATOGRAPHY (ERCP);  Surgeon: Dover JONETTA Rollin, MD;  Location: [facility];  Service: Endoscopy;  Laterality: N/A;  start ercp first, if canulation fails switch to EUS    ESOPHAGOGASTRODUODENOSCOPY (EGD) WITH PROPOFOL  N/A 05/15/2019   Procedure: ESOPHAGOGASTRODUODENOSCOPY (EGD) WITH PROPOFOL ;  Surgeon: Harvey Margo CROME, MD;  Location: [facility];  Service: Endoscopy;  Laterality: N/A;   ESOPHAGOGASTRODUODENOSCOPY (EGD) WITH PROPOFOL  N/A 03/03/2020   Procedure: ESOPHAGOGASTRODUODENOSCOPY (EGD) WITH PROPOFOL ;  Surgeon: Rollin Dover, MD;  Location: [facility];  Service: Endoscopy;  Laterality: N/A;   ESOPHAGOGASTRODUODENOSCOPY (EGD) WITH PROPOFOL  N/A 04/21/2020   Procedure: ESOPHAGOGASTRODUODENOSCOPY (EGD) WITH PROPOFOL ;  Surgeon: Rollin Dover, MD;  Location: [facility];  Service: Endoscopy;  Laterality: N/A;   EUS N/A 11/03/2012   Procedure: UPPER ENDOSCOPIC ULTRASOUND (EUS) LINEAR;  Surgeon: Dover JONETTA Rollin, MD;  Location: [facility];  Service: Endoscopy;  Laterality: N/A;   left arm     NECK SURGERY     POLYPECTOMY  04/21/2020   Procedure: POLYPECTOMY;  Surgeon: Rollin Dover, MD;  Location: [facility];  Service: Endoscopy;;   REMOVAL OF STONES  06/11/2019   Procedure: REMOVAL OF STONES;  Surgeon: Rollin Dover, MD;  Location: [facility];  Service: Endoscopy;;   TOOTH EXTRACTION N/A 05/24/2022   Procedure: DENTAL RESTORATION/EXTRACTIONS;  Surgeon: Sheryle Hamilton, DMD;  Location: [facility] OR;  Service: Oral Surgery;  Laterality: N/A;   TOTAL HIP ARTHROPLASTY Left 11/17/2022   Procedure: TOTAL HIP ARTHROPLASTY ANTERIOR APPROACH;  Surgeon: Beverley Evalene BIRCH, MD;  Location: [facility];  Service: Orthopedics;  Laterality: Left;   TOTAL SHOULDER ARTHROPLASTY Left 11/04/2023   Procedure: ARTHROPLASTY, SHOULDER, TOTAL;  Surgeon: Josefina Chew, MD;  Location: [facility];  Service: Orthopedics;  Laterality: Left;    TUBAL LIGATION     UPPER ESOPHAGEAL ENDOSCOPIC ULTRASOUND (EUS) N/A 06/11/2019   Procedure: UPPER ESOPHAGEAL ENDOSCOPIC ULTRASOUND (EUS);  Surgeon: Rollin Dover, MD;  Location: [facility];  Service: Endoscopy;  Laterality: N/A;   Patient Active Problem List   Diagnosis Date Noted   S/P reverse total shoulder arthroplasty, left 11/04/2023   Vertigo 05/28/2023   Palpitations 12/05/2022   Transaminitis 11/16/2022   Hip fracture (HCC) 11/15/2022   GERD (gastroesophageal reflux disease) 11/15/2022   Tobacco use 11/15/2022   Idiopathic hypotension 07/19/2022   Slurred speech 07/19/2022   Underweight 07/19/2022   Hypotension 07/19/2022   Seizure disorder (HCC) 07/18/2022   Chronic migraine w/o aura w/o status migrainosus, not intractable 07/18/2022   Cerebral cavernoma 07/18/2022   Acute GI bleed due to bleeding pyloric/gastric ulcer 05/15/2019   PUD (peptic ulcer disease)-EGD 05/15/2019 with bleeding pyloric/gastric ulcer, nonbleeding duodenal ulcers and gastritis    Hematemesis with nausea    Migraine headache 09/25/2015   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   Bilateral occipital neuralgia 01/02/2015   Migraine 01/02/2015   Chest pain of uncertain etiology 11/29/2014   DDD (degenerative disc disease), cervical 10/27/2014   DDD (degenerative disc disease), 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   DDD (degenerative disc disease), lumbosacral 10/27/2014   Hypokalemia 05/07/2013   Partial tear subscapularis tendon 12/27/2011    PCP: Dr. Jerilynn Carnes REFERRING PROVIDER: Dr. Chew Josefina  ONSET DATE: 11/04/23  REFERRING DIAG: s/p left reverse TSA  THERAPY DIAG:  Acute pain of left shoulder  Stiffness of left shoulder, not elsewhere classified  Other symptoms and signs involving the musculoskeletal system  Rationale for Evaluation and Treatment: Rehabilitation  SUBJECTIVE:   SUBJECTIVE STATEMENT: S: I fell on it.  PRECAUTIONS: Shoulder-see protocol  WEIGHT BEARING  RESTRICTIONS: Yes NWB  PAIN:  Are you having pain? Yes: NPRS scale: 3/10 Pain location: elbow and shoulder Pain description: sore Aggravating factors: falling on it Relieving factors: rest   FALLS: Has patient fallen in last 6 months? No  PLOF: Independent  PATIENT GOALS: To have less pain and more mobility.  NEXT MD VISIT: 01/16/24  OBJECTIVE:   HAND DOMINANCE: Right  ADLs: Overall ADLs: Pt is unable to use the LUE for any ADLs at this time. Pt reports difficulty with sleeping, waking up a lot, sleeping in the recliner, tried the bed last night with a little better sleep. Pt is unable to reach overhead or behind back, cannot lift items.   FUNCTIONAL OUTCOME MEASURES: Upper Extremity Functional Scale (UEFS): 8/80=10% 01/08/24: 29/80=36.6%   UPPER EXTREMITY ROM:       Assessed in supine, er/IR adducted  Passive ROM Left eval Left 12/11/23 Left 01/08/24  Shoulder flexion 61 129 147  Shoulder abduction 62 131 144  Shoulder internal rotation 90 90 90  Shoulder external rotation 0 50 80  (Blank rows = not tested)    Assessed in seated, er/IR adducted  Active ROM Left eval Left 01/08/24  Shoulder flexion  131  Shoulder abduction  144  Shoulder internal rotation  90  Shoulder external rotation  56  (Blank rows = not tested)  UPPER EXTREMITY MMT:     Not assessed due to protocol & pain  MMT Left eval Left 01/08/24  Shoulder flexion  4+/5  Shoulder abduction  4/5  Shoulder internal rotation  4-/5  Shoulder external rotation  4+/5  (Blank rows = not tested)   SENSATION: Pt reports intermittent numbness in the elbow  OBSERVATIONS: pt with mod fascial restrictions along upper arm, anterior shoulder, trapezius and scapular regions   TODAY'S TREATMENT:                                                                                                                              DATE:   01/15/24 -Manual techniques: to upper arm, pectoralis region, and trapezius;  mod fascial restrictions- manual completed to reduce fascial restrictions, decrease pain, and improve ROM -P/ROM: supine, shoulder flexion, abduction, internal/external rotation, abduction, 5 reps -A/ROM: supine-protraction, flexion, er, horizontal abduction, abduction, 12 reps -Proximal shoulder strengthening: supine-paddles, criss cross, circles each direction, 12 reps -A/ROM: standing-protraction, flexion, er, horizontal abduction, abduction, 12 reps -Scapular Theraband: red-row, extension, retraction, 10 reps -Overhead lacing: seated, lacing from top down then reversing -Functional reaching: placing and removing 10 cones from middle shelf of overhead cabinet in flexion, repeating in abduction -UBE: level 1, 2' forward, 2' reverse, pace: 4.5  01/13/24 -P/ROM: supine, shoulder flexion, abduction, internal/external rotation, abduction, 5 reps -A/ROM: supine-protraction, flexion, er, horizontal abduction, abduction, 12 reps -Proximal shoulder strengthening: supine-paddles, criss cross, circles each direction, 12 reps -A/ROM: standing-protraction, flexion, er, horizontal abduction, abduction, 12 reps -Shoulder stretches: flexion, doorway stretch, 3x20 holds -Proximal shoulder strengthening on doorway with washcloth, shoulder at 90 degrees flexion -Functional reaching: pt moving items to counter from middle shelf in kitchen, moving items from bottom shelf to middle shelf, then moving items from counter to bottom shelf.  -Scapular Theraband: red-row, extension, retraction, 10 reps -Overhead lacing: seated, lacing from top down then reversing -UBE: level 2, 2' forward, 2' reverse, pace: 6.5  01/08/24 -Manual techniques: to upper arm, pectoralis region, and trapezius; mod fascial restrictions- manual completed to reduce fascial restrictions, decrease pain, and improve ROM -P/ROM: supine, shoulder flexion, abduction, internal/external rotation, abduction, x6 -A/ROM: seated, protraction, flexion,  er, horizontal abduction, abduction, x12 -Shoulder stretches: flexion, doorway stretch, 3x10 holds -Discussion about use of arm at home with simple cooking and cleaning tasks, importance of stretching to improve mobility functionally at home     PATIENT EDUCATION: Education details: Scapular theraband-red Person educated: Patient Education method: Explanation, Demonstration, and Handouts Education comprehension: verbalized understanding and returned demonstration  HOME EXERCISE PROGRAM: Eval: pendulums, table slides (no abduction) wrist A/ROM, elbow self-ROM 12/04/23: pulleys 3x/day, for 1-2' each 12/11/23: AA/ROM 01/01/24: A/ROM 01/15/24: red scapular theraband  GOALS: Goals reviewed with patient? Yes   SHORT TERM GOALS: Target date: 12/12/23  Pt will be provided with and educated on HEP to improve mobility in LUE required for use during  ADL completion.   Goal status: IN PROGRESS  2.  Pt will increase LUE P/ROM by 50+ degrees to improve ability to use LUE during dressing tasks with minimal compensatory techniques.   Goal status: MET  3.  Pt will increase LUE strength to 3+/5 to improve ability to reach for items at waist to chest height during bathing and grooming tasks.   Goal status: IN PROGRESS   LONG TERM GOALS: Target date: 01/11/24  Pt will decrease pain in LUE to 3/10 or less to improve ability to sleep for 2+ consecutive hours without waking due to pain.   Goal status: IN PROGRESS  2.  Pt will decrease LUE fascial restrictions to min amounts or less to improve mobility required for functional reaching tasks.   Goal status: IN PROGRESS  3.  Pt will increase LUE A/ROM by 75+ degrees to improve ability to use LUE when reaching overhead or behind back during dressing and bathing tasks.   Goal status: IN PROGRESS  4.  Pt will increase LUE strength to 4+/5 or greater to improve ability to use LUE when lifting or carrying items during meal  preparation/housework/yardwork tasks.   Goal status: IN PROGRESS  5.  Pt will return to highest level of function using LUE as non-dominant during functional task completion.   Goal status: IN PROGRESS   ASSESSMENT:  CLINICAL IMPRESSION: Pt reports she had a fall this week, has soreness today, reports did not complete HEP instead let arm rest. Pt with slightly increased fascial restrictions, manual techniques completed to address. Continued with A/ROM in supine and standing, ROM at approximately 75%+ in supine and 65% in standing for flexion and abduction. Continued with scapular strengthening and stability and well as form and posture with scapular theraband work. Pt completing functional reaching and overhead lacing, UBE for activity tolerance. Verbal cuing for form and technique.    PERFORMANCE DEFICITS: in functional skills including in functional skills including ADLs, IADLs, coordination, tone, ROM, strength, pain, fascial restrictions, muscle spasms, and UE functional use     PLAN:  OT FREQUENCY: 2x/week  OT DURATION: 8 weeks  PLANNED INTERVENTIONS: 97168 OT Re-evaluation, 97535 self care/ADL training, 02889 therapeutic exercise, 97530 therapeutic activity, 97112 neuromuscular re-education, 97140 manual therapy, 97014 electrical stimulation unattended, patient/family education, and DME and/or AE instructions  CONSULTED AND AGREED WITH PLAN OF CARE: Patient  PLAN FOR NEXT SESSION: manual techniques prn, A/ROM, shoulder stretches, functional reaching, update HEP for scapular theraband    Sonny Cory, OTR/L  [phone] 01/15/2024, 2:33 PM

## 2024-01-15 NOTE — Patient Instructions (Signed)

## 2024-01-20 ENCOUNTER — Encounter (HOSPITAL_COMMUNITY): Payer: Self-pay | Admitting: Occupational Therapy

## 2024-01-20 ENCOUNTER — Ambulatory Visit (HOSPITAL_COMMUNITY): Admitting: Occupational Therapy

## 2024-01-20 DIAGNOSIS — M25512 Pain in left shoulder: Secondary | ICD-10-CM | POA: Diagnosis not present

## 2024-01-20 DIAGNOSIS — M25612 Stiffness of left shoulder, not elsewhere classified: Secondary | ICD-10-CM

## 2024-01-20 DIAGNOSIS — R29898 Other symptoms and signs involving the musculoskeletal system: Secondary | ICD-10-CM

## 2024-01-20 NOTE — Therapy (Signed)
 OUTPATIENT OCCUPATIONAL THERAPY ORTHO TREATMENT   [name] MRN: [id number] DOB:03/29/1971, 53 y.o., female Today's Date: 01/20/2024   END OF SESSION:  OT End of Session - 01/20/24 1226     Visit Number 18    Number of Visits 23    Date for OT Re-Evaluation 02/06/24    Authorization Type 1) [employer] Dual Complete Medicare 2) [place] Medicaid    Authorization Time Period no auth required    Progress Note Due on Visit 20    OT Start Time 1150    OT Stop Time 1230    OT Time Calculation (min) 40 min    Activity Tolerance Patient tolerated treatment well    Behavior During Therapy WFL for tasks assessed/performed             Past Medical History:  Diagnosis Date   Asthma    seasonal   Bruising, spontaneous 12/08/2013   Cancer (HCC)    cervical cancer - had hysterectomy   Chronic back pain    Chronic back pain    DDD (degenerative disc disease), lumbar    DDD (degenerative disc disease), lumbar    Dysrhythmia    tachycardia   Fibromyalgia    Headache(784.0)    migraines   History of kidney stones    Ovarian cyst    Partial tear subscapularis tendon 12/27/2011   Scoliosis    Seizures (HCC)    started 9/12-   Past Surgical History:  Procedure Laterality Date   ABDOMINAL HYSTERECTOMY  2012   BACK SURGERY  1989   scoliosis throsic-rods   CESAREAN SECTION     CHOLECYSTECTOMY N/A 11/17/2012   Procedure: LAPAROSCOPIC CHOLECYSTECTOMY WITH INTRAOPERATIVE CHOLANGIOGRAM;  Surgeon: Lynda Leos, MD;  Location: [facility];  Service: General;  Laterality: N/A;   COLONOSCOPY WITH PROPOFOL  N/A 04/21/2020   Procedure: COLONOSCOPY WITH PROPOFOL ;  Surgeon: Rollin Dover, MD;  Location: [facility];  Service: Endoscopy;  Laterality: N/A;   ENDOSCOPIC RETROGRADE CHOLANGIOPANCREATOGRAPHY (ERCP) WITH PROPOFOL  N/A 06/11/2019   Procedure: ENDOSCOPIC RETROGRADE CHOLANGIOPANCREATOGRAPHY (ERCP) WITH PROPOFOL ;  Surgeon: Rollin Dover, MD;  Location: [facility];  Service:  Endoscopy;  Laterality: N/A;   ERCP N/A 05/07/2013   Procedure: ENDOSCOPIC RETROGRADE CHOLANGIOPANCREATOGRAPHY (ERCP);  Surgeon: Dover JONETTA Rollin, MD;  Location: [facility];  Service: Endoscopy;  Laterality: N/A;  start ercp first, if canulation fails switch to EUS    ESOPHAGOGASTRODUODENOSCOPY (EGD) WITH PROPOFOL  N/A 05/15/2019   Procedure: ESOPHAGOGASTRODUODENOSCOPY (EGD) WITH PROPOFOL ;  Surgeon: Harvey Margo CROME, MD;  Location: [facility];  Service: Endoscopy;  Laterality: N/A;   ESOPHAGOGASTRODUODENOSCOPY (EGD) WITH PROPOFOL  N/A 03/03/2020   Procedure: ESOPHAGOGASTRODUODENOSCOPY (EGD) WITH PROPOFOL ;  Surgeon: Rollin Dover, MD;  Location: [facility];  Service: Endoscopy;  Laterality: N/A;   ESOPHAGOGASTRODUODENOSCOPY (EGD) WITH PROPOFOL  N/A 04/21/2020   Procedure: ESOPHAGOGASTRODUODENOSCOPY (EGD) WITH PROPOFOL ;  Surgeon: Rollin Dover, MD;  Location: [facility];  Service: Endoscopy;  Laterality: N/A;   EUS N/A 11/03/2012   Procedure: UPPER ENDOSCOPIC ULTRASOUND (EUS) LINEAR;  Surgeon: Dover JONETTA Rollin, MD;  Location: [facility];  Service: Endoscopy;  Laterality: N/A;   left arm     NECK SURGERY     POLYPECTOMY  04/21/2020   Procedure: POLYPECTOMY;  Surgeon: Rollin Dover, MD;  Location: [facility];  Service: Endoscopy;;   REMOVAL OF STONES  06/11/2019   Procedure: REMOVAL OF STONES;  Surgeon: Rollin Dover, MD;  Location: [facility];  Service: Endoscopy;;   TOOTH EXTRACTION N/A 05/24/2022   Procedure: DENTAL RESTORATION/EXTRACTIONS;  Surgeon: Sheryle Hamilton, DMD;  Location: [facility];  Service: Oral Surgery;  Laterality: N/A;   TOTAL HIP ARTHROPLASTY Left 11/17/2022   Procedure: TOTAL HIP ARTHROPLASTY ANTERIOR APPROACH;  Surgeon: Beverley Evalene BIRCH, MD;  Location: [facility];  Service: Orthopedics;  Laterality: Left;   TOTAL SHOULDER ARTHROPLASTY Left 11/04/2023   Procedure: ARTHROPLASTY, SHOULDER, TOTAL;  Surgeon: Josefina Chew, MD;  Location: [facility];  Service: Orthopedics;  Laterality: Left;    TUBAL LIGATION     UPPER ESOPHAGEAL ENDOSCOPIC ULTRASOUND (EUS) N/A 06/11/2019   Procedure: UPPER ESOPHAGEAL ENDOSCOPIC ULTRASOUND (EUS);  Surgeon: Rollin Dover, MD;  Location: [facility];  Service: Endoscopy;  Laterality: N/A;   Patient Active Problem List   Diagnosis Date Noted   S/P reverse total shoulder arthroplasty, left 11/04/2023   Vertigo 05/28/2023   Palpitations 12/05/2022   Transaminitis 11/16/2022   Hip fracture (HCC) 11/15/2022   GERD (gastroesophageal reflux disease) 11/15/2022   Tobacco use 11/15/2022   Idiopathic hypotension 07/19/2022   Slurred speech 07/19/2022   Underweight 07/19/2022   Hypotension 07/19/2022   Seizure disorder (HCC) 07/18/2022   Chronic migraine w/o aura w/o status migrainosus, not intractable 07/18/2022   Cerebral cavernoma 07/18/2022   Acute GI bleed due to bleeding pyloric/gastric ulcer 05/15/2019   PUD (peptic ulcer disease)-EGD 05/15/2019 with bleeding pyloric/gastric ulcer, nonbleeding duodenal ulcers and gastritis    Hematemesis with nausea    Migraine headache 09/25/2015   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   Bilateral occipital neuralgia 01/02/2015   Migraine 01/02/2015   Chest pain of uncertain etiology 11/29/2014   DDD (degenerative disc disease), cervical 10/27/2014   DDD (degenerative disc disease), 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   DDD (degenerative disc disease), lumbosacral 10/27/2014   Hypokalemia 05/07/2013   Partial tear subscapularis tendon 12/27/2011    PCP: Dr. Jerilynn Carnes REFERRING PROVIDER: Dr. Chew Josefina  ONSET DATE: 11/04/23  REFERRING DIAG: s/p left reverse TSA  THERAPY DIAG:  Acute pain of left shoulder  Stiffness of left shoulder, not elsewhere classified  Other symptoms and signs involving the musculoskeletal system  Rationale for Evaluation and Treatment: Rehabilitation  SUBJECTIVE:   SUBJECTIVE STATEMENT: S: It's feeling alright   PRECAUTIONS: Shoulder-see protocol  WEIGHT  BEARING RESTRICTIONS: Yes NWB  PAIN:  Are you having pain? Yes: NPRS scale: 1/10 Pain location: elbow and shoulder Pain description: sore Aggravating factors: falling on it Relieving factors: rest   FALLS: Has patient fallen in last 6 months? No  PLOF: Independent  PATIENT GOALS: To have less pain and more mobility.  NEXT MD VISIT: Sept 5th 2025  OBJECTIVE:   HAND DOMINANCE: Right  ADLs: Overall ADLs: Pt is unable to use the LUE for any ADLs at this time. Pt reports difficulty with sleeping, waking up a lot, sleeping in the recliner, tried the bed last night with a little better sleep. Pt is unable to reach overhead or behind back, cannot lift items.   FUNCTIONAL OUTCOME MEASURES: Upper Extremity Functional Scale (UEFS): 8/80=10% 01/08/24: 29/80=36.6%   UPPER EXTREMITY ROM:       Assessed in supine, er/IR adducted  Passive ROM Left eval Left 12/11/23 Left 01/08/24  Shoulder flexion 61 129 147  Shoulder abduction 62 131 144  Shoulder internal rotation 90 90 90  Shoulder external rotation 0 50 80  (Blank rows = not tested)    Assessed in seated, er/IR adducted  Active ROM Left eval Left 01/08/24  Shoulder flexion  131  Shoulder abduction  144  Shoulder internal rotation  90  Shoulder external rotation  56  (Blank rows = not tested)  UPPER EXTREMITY MMT:     Not assessed due to protocol & pain  MMT Left eval Left 01/08/24  Shoulder flexion  4+/5  Shoulder abduction  4/5  Shoulder internal rotation  4-/5  Shoulder external rotation  4+/5  (Blank rows = not tested)   SENSATION: Pt reports intermittent numbness in the elbow  OBSERVATIONS: pt with mod fascial restrictions along upper arm, anterior shoulder, trapezius and scapular regions   TODAY'S TREATMENT:                                                                                                                              DATE:   01/20/24 -Manual techniques: to upper arm, pectoralis region, and  trapezius; mod fascial restrictions- manual completed to reduce fascial restrictions, decrease pain, and improve ROM -P/ROM: supine, shoulder flexion, abduction, internal/external rotation, abduction, 5 reps -A/ROM: supine-protraction, flexion, er, horizontal abduction, abduction, 15 reps -Proximal shoulder strengthening: supine-paddles, criss cross, circles each direction, 12 reps -Shoulder stretches: flexion, doorway stretch, 3x20 holds -Functional reaching: placing 10 cones from middle shelf of overhead cabinet in flexion, removing in abduction -Scapular Theraband: red-row, extension, retraction, 12 reps -Overhead lacing: seated, lacing from top down then reversing -UBE: level 2, 3' forward, 3' reverse, pace: 4.5  01/15/24 -Manual techniques: to upper arm, pectoralis region, and trapezius; mod fascial restrictions- manual completed to reduce fascial restrictions, decrease pain, and improve ROM -P/ROM: supine, shoulder flexion, abduction, internal/external rotation, abduction, 5 reps -A/ROM: supine-protraction, flexion, er, horizontal abduction, abduction, 12 reps -Proximal shoulder strengthening: supine-paddles, criss cross, circles each direction, 12 reps -A/ROM: standing-protraction, flexion, er, horizontal abduction, abduction, 12 reps -Scapular Theraband: red-row, extension, retraction, 10 reps -Overhead lacing: seated, lacing from top down then reversing -Functional reaching: placing and removing 10 cones from middle shelf of overhead cabinet in flexion, repeating in abduction -UBE: level 1, 2' forward, 2' reverse, pace: 4.5  01/13/24 -P/ROM: supine, shoulder flexion, abduction, internal/external rotation, abduction, 5 reps -A/ROM: supine-protraction, flexion, er, horizontal abduction, abduction, 12 reps -Proximal shoulder strengthening: supine-paddles, criss cross, circles each direction, 12 reps -A/ROM: standing-protraction, flexion, er, horizontal abduction, abduction, 12  reps -Shoulder stretches: flexion, doorway stretch, 3x20 holds -Proximal shoulder strengthening on doorway with washcloth, shoulder at 90 degrees flexion -Functional reaching: pt moving items to counter from middle shelf in kitchen, moving items from bottom shelf to middle shelf, then moving items from counter to bottom shelf.  -Scapular Theraband: red-row, extension, retraction, 10 reps -Overhead lacing: seated, lacing from top down then reversing -UBE: level 2, 2' forward, 2' reverse, pace: 6.5     PATIENT EDUCATION: Education details: reviewed HEP Person educated: Patient Education method: Explanation, Demonstration, and Handouts Education comprehension: verbalized understanding and returned demonstration  HOME EXERCISE PROGRAM: Eval: pendulums, table slides (no abduction) wrist A/ROM, elbow self-ROM 12/04/23: pulleys 3x/day, for 1-2' each 12/11/23: AA/ROM 01/01/24: A/ROM 01/15/24: red scapular theraband  GOALS: Goals reviewed with patient? Yes   SHORT TERM GOALS: Target date: 12/12/23  Pt will be provided with and educated on HEP to improve mobility in LUE required for use during ADL completion.   Goal status: IN PROGRESS  2.  Pt will increase LUE P/ROM by 50+ degrees to improve ability to use LUE during dressing tasks with minimal compensatory techniques.   Goal status: MET  3.  Pt will increase LUE strength to 3+/5 to improve ability to reach for items at waist to chest height during bathing and grooming tasks.   Goal status: IN PROGRESS   LONG TERM GOALS: Target date: 01/11/24  Pt will decrease pain in LUE to 3/10 or less to improve ability to sleep for 2+ consecutive hours without waking due to pain.   Goal status: IN PROGRESS  2.  Pt will decrease LUE fascial restrictions to min amounts or less to improve mobility required for functional reaching tasks.   Goal status: IN PROGRESS  3.  Pt will increase LUE A/ROM by 75+ degrees to improve ability to use LUE when  reaching overhead or behind back during dressing and bathing tasks.   Goal status: IN PROGRESS  4.  Pt will increase LUE strength to 4+/5 or greater to improve ability to use LUE when lifting or carrying items during meal preparation/housework/yardwork tasks.   Goal status: IN PROGRESS  5.  Pt will return to highest level of function using LUE as non-dominant during functional task completion.   Goal status: IN PROGRESS   ASSESSMENT:  CLINICAL IMPRESSION: Pt reports MD is pleased with her progress and checked her elbow out from her recent fall, no concerns. Continued with A/ROM progressing to 15 repetitions. Also focusing on functional reaching and progressing stability in the shoulder required for functional task completion. Pt with less fatigue today during overhead lacing, improved form with scapular theraband. Verbal cuing for form and technique.    PERFORMANCE DEFICITS: in functional skills including in functional skills including ADLs, IADLs, coordination, tone, ROM, strength, pain, fascial restrictions, muscle spasms, and UE functional use     PLAN:  OT FREQUENCY: 2x/week  OT DURATION: 8 weeks  PLANNED INTERVENTIONS: 97168 OT Re-evaluation, 97535 self care/ADL training, 02889 therapeutic exercise, 97530 therapeutic activity, 97112 neuromuscular re-education, 97140 manual therapy, 97014 electrical stimulation unattended, patient/family education, and DME and/or AE instructions  CONSULTED AND AGREED WITH PLAN OF CARE: Patient  PLAN FOR NEXT SESSION: manual techniques prn, A/ROM, shoulder stretches, functional reaching, light strengthening ok, less than 3#    Sonny Cory, OTR/L  [phone] 01/20/2024, 12:30 PM

## 2024-01-21 ENCOUNTER — Other Ambulatory Visit (HOSPITAL_COMMUNITY): Payer: Self-pay | Admitting: Internal Medicine

## 2024-01-21 DIAGNOSIS — Z1231 Encounter for screening mammogram for malignant neoplasm of breast: Secondary | ICD-10-CM

## 2024-01-22 ENCOUNTER — Ambulatory Visit (HOSPITAL_COMMUNITY): Admitting: Occupational Therapy

## 2024-01-22 ENCOUNTER — Encounter (HOSPITAL_COMMUNITY): Payer: Self-pay | Admitting: Occupational Therapy

## 2024-01-22 DIAGNOSIS — R29898 Other symptoms and signs involving the musculoskeletal system: Secondary | ICD-10-CM

## 2024-01-22 DIAGNOSIS — M25512 Pain in left shoulder: Secondary | ICD-10-CM

## 2024-01-22 DIAGNOSIS — M25612 Stiffness of left shoulder, not elsewhere classified: Secondary | ICD-10-CM

## 2024-01-22 NOTE — Therapy (Signed)
 OUTPATIENT OCCUPATIONAL THERAPY ORTHO TREATMENT   [name] MRN: [id number] DOB:[birth year], 53 y.o., female Today's Date: 01/22/2024   END OF SESSION:  OT End of Session - 01/22/24 1349     Visit Number 19    Number of Visits 23    Date for OT Re-Evaluation 02/06/24    Authorization Type 1) [employer] Dual Complete Medicare 2) [place] Medicaid    Authorization Time Period no auth required    Progress Note Due on Visit 20    OT Start Time 1303    OT Stop Time 1345    OT Time Calculation (min) 42 min    Activity Tolerance Patient tolerated treatment well    Behavior During Therapy WFL for tasks assessed/performed            Past Medical History:  Diagnosis Date   Asthma    seasonal   Bruising, spontaneous 12/08/2013   Cancer (HCC)    cervical cancer - had hysterectomy   Chronic back pain    Chronic back pain    DDD (degenerative disc disease), lumbar    DDD (degenerative disc disease), lumbar    Dysrhythmia    tachycardia   Fibromyalgia    Headache(784.0)    migraines   History of kidney stones    Ovarian cyst    Partial tear subscapularis tendon 12/27/2011   Scoliosis    Seizures (HCC)    started 9/12-   Past Surgical History:  Procedure Laterality Date   ABDOMINAL HYSTERECTOMY  2012   BACK SURGERY  1989   scoliosis throsic-rods   CESAREAN SECTION     CHOLECYSTECTOMY N/A 11/17/2012   Procedure: LAPAROSCOPIC CHOLECYSTECTOMY WITH INTRAOPERATIVE CHOLANGIOGRAM;  Surgeon: Lynda Leos, MD;  Location: [facility];  Service: General;  Laterality: N/A;   COLONOSCOPY WITH PROPOFOL  N/A 04/21/2020   Procedure: COLONOSCOPY WITH PROPOFOL ;  Surgeon: Rollin Dover, MD;  Location: [facility];  Service: Endoscopy;  Laterality: N/A;   ENDOSCOPIC RETROGRADE CHOLANGIOPANCREATOGRAPHY (ERCP) WITH PROPOFOL  N/A 06/11/2019   Procedure: ENDOSCOPIC RETROGRADE CHOLANGIOPANCREATOGRAPHY (ERCP) WITH PROPOFOL ;  Surgeon: Rollin Dover, MD;  Location: [facility];  Service: Endoscopy;   Laterality: N/A;   ERCP N/A 05/07/2013   Procedure: ENDOSCOPIC RETROGRADE CHOLANGIOPANCREATOGRAPHY (ERCP);  Surgeon: Dover JONETTA Rollin, MD;  Location: [facility];  Service: Endoscopy;  Laterality: N/A;  start ercp first, if canulation fails switch to EUS    ESOPHAGOGASTRODUODENOSCOPY (EGD) WITH PROPOFOL  N/A 05/15/2019   Procedure: ESOPHAGOGASTRODUODENOSCOPY (EGD) WITH PROPOFOL ;  Surgeon: Harvey Margo CROME, MD;  Location: [facility];  Service: Endoscopy;  Laterality: N/A;   ESOPHAGOGASTRODUODENOSCOPY (EGD) WITH PROPOFOL  N/A 03/03/2020   Procedure: ESOPHAGOGASTRODUODENOSCOPY (EGD) WITH PROPOFOL ;  Surgeon: Rollin Dover, MD;  [facility]: [facility];  Service: Endoscopy;  Laterality: N/A;   ESOPHAGOGASTRODUODENOSCOPY (EGD) WITH PROPOFOL  N/A 04/21/2020   Procedure: ESOPHAGOGASTRODUODENOSCOPY (EGD) WITH PROPOFOL ;  Surgeon: Rollin Dover, MD;  [facility]: [facility];  Service: Endoscopy;  Laterality: N/A;   EUS N/A 11/03/2012   Procedure: UPPER ENDOSCOPIC ULTRASOUND (EUS) LINEAR;  Surgeon: Dover JONETTA Rollin, MD;  Location: [facility];  Service: Endoscopy;  Laterality: N/A;   left arm     NECK SURGERY     POLYPECTOMY  04/21/2020   Procedure: POLYPECTOMY;  Surgeon: Rollin Dover, MD;  Location: [facility];  Service: Endoscopy;;   REMOVAL OF STONES  06/11/2019   Procedure: REMOVAL OF STONES;  Surgeon: Rollin Dover, MD;  Location: [facility];  Service: Endoscopy;;   TOOTH EXTRACTION N/A 05/24/2022   Procedure: DENTAL RESTORATION/EXTRACTIONS;  Surgeon:  Sheryle Hamilton, DMD;  Location: [facility];  Service: Oral Surgery;  Laterality: N/A;   TOTAL HIP ARTHROPLASTY Left 11/17/2022   Procedure: TOTAL HIP ARTHROPLASTY ANTERIOR APPROACH;  Surgeon: Beverley Evalene BIRCH, MD;  Location: [facility];  Service: Orthopedics;  Laterality: Left;   TOTAL SHOULDER ARTHROPLASTY Left 11/04/2023   Procedure: ARTHROPLASTY, SHOULDER, TOTAL;  Surgeon: Josefina Chew, MD;  Location: [facility];  Service: Orthopedics;  Laterality: Left;   TUBAL  LIGATION     UPPER ESOPHAGEAL ENDOSCOPIC ULTRASOUND (EUS) N/A 06/11/2019   Procedure: UPPER ESOPHAGEAL ENDOSCOPIC ULTRASOUND (EUS);  Surgeon: Rollin Dover, MD;  Location: [facility];  Service: Endoscopy;  Laterality: N/A;   Patient Active Problem List   Diagnosis Date Noted   S/P reverse total shoulder arthroplasty, left 11/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   Vertigo 05/28/2023   Palpitations 12/05/2022   Transaminitis 11/16/2022   Hip fracture (HCC) 11/15/2022   GERD (gastroesophageal reflux disease) 11/15/2022   Tobacco use 11/15/2022   Idiopathic hypotension 07/19/2022   Slurred speech 07/19/2022   Underweight 07/19/2022   Hypotension 07/19/2022   Seizure disorder (HCC) 07/18/2022   Chronic migraine w/o aura w/o status migrainosus, not intractable 07/18/2022   Cerebral cavernoma 07/18/2022   Acute GI bleed due to bleeding pyloric/gastric ulcer 05/15/2019   PUD (peptic ulcer disease)-EGD 05/15/2019 with bleeding pyloric/gastric ulcer, nonbleeding duodenal ulcers and gastritis    Hematemesis with nausea    Migraine headache 09/25/2015   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   Bilateral occipital neuralgia 01/02/2015   Migraine 01/02/2015   Chest pain of uncertain etiology 11/29/2014   DDD (degenerative disc disease), cervical 10/27/2014   DDD (degenerative disc disease), 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   DDD (degenerative disc disease), lumbosacral 10/27/2014   Hypokalemia 05/07/2013   Partial tear subscapularis tendon 12/27/2011    PCP: Dr. Jerilynn Carnes REFERRING PROVIDER: Dr. Chew Josefina  ONSET DATE: 11/04/23  REFERRING DIAG: s/p left reverse TSA  THERAPY DIAG:  Acute pain of left shoulder  Stiffness of left shoulder, not elsewhere classified  Other symptoms and signs involving the musculoskeletal system  Rationale for Evaluation and Treatment: Rehabilitation  SUBJECTIVE:   SUBJECTIVE STATEMENT: S: It's feeling alright   PRECAUTIONS: Shoulder-see protocol  WEIGHT BEARING  RESTRICTIONS: Yes NWB  PAIN:  Are you having pain? Yes: NPRS scale: 1/10 Pain location: elbow and shoulder Pain description: sore Aggravating factors: falling on it Relieving factors: rest   FALLS: Has patient fallen in last 6 months? No  PLOF: Independent  PATIENT GOALS: To have less pain and more mobility.  NEXT MD VISIT: Sept 5th 2025  OBJECTIVE:   HAND DOMINANCE: Right  ADLs: Overall ADLs: Pt is unable to use the LUE for any ADLs at this time. Pt reports difficulty with sleeping, waking up a lot, sleeping in the recliner, tried the bed last night with a little better sleep. Pt is unable to reach overhead or behind back, cannot lift items.   FUNCTIONAL OUTCOME MEASURES: Upper Extremity Functional Scale (UEFS): 8/80=10% 01/08/24: 29/80=36.6%   UPPER EXTREMITY ROM:       Assessed in supine, er/IR adducted  Passive ROM Left eval Left 12/11/23 Left 01/08/24  Shoulder flexion 61 129 147  Shoulder abduction 62 131 144  Shoulder internal rotation 90 90 90  Shoulder external rotation 0 50 80  (Blank rows = not tested)    Assessed in seated, er/IR adducted  Active ROM Left eval Left 01/08/24  Shoulder flexion  131  Shoulder abduction  144  Shoulder internal rotation  90  Shoulder external rotation  56  (Blank rows = not tested)  UPPER EXTREMITY MMT:     Not assessed due to protocol & pain  MMT Left eval Left 01/08/24  Shoulder flexion  4+/5  Shoulder abduction  4/5  Shoulder internal rotation  4-/5  Shoulder external rotation  4+/5  (Blank rows = not tested)   SENSATION: Pt reports intermittent numbness in the elbow  OBSERVATIONS: pt with mod fascial restrictions along upper arm, anterior shoulder, trapezius and scapular regions   TODAY'S TREATMENT:                                                                                                                              DATE:   01/22/24 -Manual techniques: to upper arm, pectoralis region, and  trapezius; mod fascial restrictions- manual completed to reduce fascial restrictions, decrease pain, and improve ROM -P/ROM: supine, shoulder flexion, abduction, internal/external rotation, abduction, 5 reps -A/ROM: supine-protraction, flexion, er, horizontal abduction, abduction, 15 reps -Proximal shoulder strengthening: supine-paddles, criss cross, circles each direction, 12 reps -Scapular Theraband: green-row, extension, retraction, 15 reps -Shoulder Theraband: yellow- horizontal abduction, er, IR, 15 reps  01/20/24 -Manual techniques: to upper arm, pectoralis region, and trapezius; mod fascial restrictions- manual completed to reduce fascial restrictions, decrease pain, and improve ROM -P/ROM: supine, shoulder flexion, abduction, internal/external rotation, abduction, 5 reps -A/ROM: supine-protraction, flexion, er, horizontal abduction, abduction, 15 reps -Proximal shoulder strengthening: supine-paddles, criss cross, circles each direction, 12 reps -Shoulder stretches: flexion, doorway stretch, 3x20 holds -Functional reaching: placing 10 cones from middle shelf of overhead cabinet in flexion, removing in abduction -Scapular Theraband: red-row, extension, retraction, 12 reps -Overhead lacing: seated, lacing from top down then reversing -UBE: level 2, 3' forward, 3' reverse, pace: 4.5  01/15/24 -Manual techniques: to upper arm, pectoralis region, and trapezius; mod fascial restrictions- manual completed to reduce fascial restrictions, decrease pain, and improve ROM -P/ROM: supine, shoulder flexion, abduction, internal/external rotation, abduction, 5 reps -A/ROM: supine-protraction, flexion, er, horizontal abduction, abduction, 12 reps -Proximal shoulder strengthening: supine-paddles, criss cross, circles each direction, 12 reps -A/ROM: standing-protraction, flexion, er, horizontal abduction, abduction, 12 reps -Scapular Theraband: red-row, extension, retraction, 10 reps -Overhead lacing:  seated, lacing from top down then reversing -Functional reaching: placing and removing 10 cones from middle shelf of overhead cabinet in flexion, repeating in abduction -UBE: level 1, 2' forward, 2' reverse, pace: 4.5  01/13/24 -P/ROM: supine, shoulder flexion, abduction, internal/external rotation, abduction, 5 reps -A/ROM: supine-protraction, flexion, er, horizontal abduction, abduction, 12 reps -Proximal shoulder strengthening: supine-paddles, criss cross, circles each direction, 12 reps -A/ROM: standing-protraction, flexion, er, horizontal abduction, abduction, 12 reps -Shoulder stretches: flexion, doorway stretch, 3x20 holds -Proximal shoulder strengthening on doorway with washcloth, shoulder at 90 degrees flexion -Functional reaching: pt moving items to counter from middle shelf in kitchen, moving items from bottom shelf to middle shelf, then moving items from counter to bottom shelf.  -Scapular Theraband: red-row, extension, retraction, 10 reps -Overhead lacing: seated, lacing  from top down then reversing -UBE: level 2, 2' forward, 2' reverse, pace: 6.5     PATIENT EDUCATION: Education details: reviewed HEP Person educated: Patient Education method: Explanation, Demonstration, and Handouts Education comprehension: verbalized understanding and returned demonstration  HOME EXERCISE PROGRAM: Eval: pendulums, table slides (no abduction) wrist A/ROM, elbow self-ROM 12/04/23: pulleys 3x/day, for 1-2' each 12/11/23: AA/ROM 01/01/24: A/ROM 01/15/24: red scapular theraband  GOALS: Goals reviewed with patient? Yes   SHORT TERM GOALS: Target date: 12/12/23  Pt will be provided with and educated on HEP to improve mobility in LUE required for use during ADL completion.   Goal status: IN PROGRESS  2.  Pt will increase LUE P/ROM by 50+ degrees to improve ability to use LUE during dressing tasks with minimal compensatory techniques.   Goal status: MET  3.  Pt will increase LUE  strength to 3+/5 to improve ability to reach for items at waist to chest height during bathing and grooming tasks.   Goal status: IN PROGRESS   LONG TERM GOALS: Target date: 01/11/24  Pt will decrease pain in LUE to 3/10 or less to improve ability to sleep for 2+ consecutive hours without waking due to pain.   Goal status: IN PROGRESS  2.  Pt will decrease LUE fascial restrictions to min amounts or less to improve mobility required for functional reaching tasks.   Goal status: IN PROGRESS  3.  Pt will increase LUE A/ROM by 75+ degrees to improve ability to use LUE when reaching overhead or behind back during dressing and bathing tasks.   Goal status: IN PROGRESS  4.  Pt will increase LUE strength to 4+/5 or greater to improve ability to use LUE when lifting or carrying items during meal preparation/housework/yardwork tasks.   Goal status: IN PROGRESS  5.  Pt will return to highest level of function using LUE as non-dominant during functional task completion.   Goal status: IN PROGRESS   ASSESSMENT:  CLINICAL IMPRESSION: Pt continuing to make good progress with all OT goals. Her Rom is [facility] and strength is slowly progressing. OT added shoulder theraband exercises to continue addressing her strength, however she could only tolerate the yellow band due to weakness. Continued with ROM and endurance based exercises to improve functional strength and mobility. Verbal and tactile cuing provided throughout session for positioning and technique.    PERFORMANCE DEFICITS: in functional skills including in functional skills including ADLs, IADLs, coordination, tone, ROM, strength, pain, fascial restrictions, muscle spasms, and UE functional use     PLAN:  OT FREQUENCY: 2x/week  OT DURATION: 8 weeks  PLANNED INTERVENTIONS: 97168 OT Re-evaluation, 97535 self care/ADL training, 02889 therapeutic exercise, 97530 therapeutic activity, 97112 neuromuscular re-education, 97140 manual therapy,  97014 electrical stimulation unattended, patient/family education, and DME and/or AE instructions  CONSULTED AND AGREED WITH PLAN OF CARE: Patient  PLAN FOR NEXT SESSION: manual techniques prn, A/ROM, shoulder stretches, functional reaching, light strengthening ok, less than 3#    Valentin Nightingale, OTR/L [phone] 01/22/2024, 1:49 PM

## 2024-01-26 ENCOUNTER — Ambulatory Visit (HOSPITAL_COMMUNITY): Attending: Orthopedic Surgery | Admitting: Occupational Therapy

## 2024-01-26 ENCOUNTER — Encounter (HOSPITAL_COMMUNITY): Payer: Self-pay | Admitting: Occupational Therapy

## 2024-01-26 ENCOUNTER — Ambulatory Visit (HOSPITAL_COMMUNITY)
Admission: RE | Admit: 2024-01-26 | Discharge: 2024-01-26 | Disposition: A | Discharge status: Home / Self Care | Source: Ambulatory Visit | Attending: Internal Medicine | Admitting: Internal Medicine

## 2024-01-26 ENCOUNTER — Encounter (HOSPITAL_COMMUNITY): Payer: Self-pay

## 2024-01-26 DIAGNOSIS — R29898 Other symptoms and signs involving the musculoskeletal system: Secondary | ICD-10-CM | POA: Diagnosis not present

## 2024-01-26 DIAGNOSIS — G894 Chronic pain syndrome: Secondary | ICD-10-CM | POA: Diagnosis not present

## 2024-01-26 DIAGNOSIS — M25612 Stiffness of left shoulder, not elsewhere classified: Secondary | ICD-10-CM | POA: Diagnosis not present

## 2024-01-26 DIAGNOSIS — M25512 Pain in left shoulder: Secondary | ICD-10-CM | POA: Insufficient documentation

## 2024-01-26 DIAGNOSIS — M1991 Primary osteoarthritis, unspecified site: Secondary | ICD-10-CM | POA: Diagnosis not present

## 2024-01-26 DIAGNOSIS — G250 Essential tremor: Secondary | ICD-10-CM | POA: Diagnosis not present

## 2024-01-26 DIAGNOSIS — Z1231 Encounter for screening mammogram for malignant neoplasm of breast: Secondary | ICD-10-CM | POA: Insufficient documentation

## 2024-01-26 NOTE — Patient Instructions (Signed)

## 2024-01-26 NOTE — Therapy (Signed)
 OUTPATIENT OCCUPATIONAL THERAPY ORTHO TREATMENT   [name] MRN: [id number] DOB:Sep 08, 1970, 53 y.o., female Today's Date: 01/26/2024  Progress Note Reporting Period 01/08/24 to 01/26/24  See note below for Objective Data and Assessment of Progress/Goals.    END OF SESSION:  OT End of Session - 01/26/24 1506     Visit Number 20    Number of Visits 23    Date for OT Re-Evaluation 02/06/24    Authorization Type 1) [employer] Dual Complete Medicare 2) [place] Medicaid    Authorization Time Period no auth required    Progress Note Due on Visit 20    OT Start Time 1346    OT Stop Time 1428    OT Time Calculation (min) 42 min    Activity Tolerance Patient tolerated treatment well    Behavior During Therapy WFL for tasks assessed/performed          Past Medical History:  Diagnosis Date   Asthma    seasonal   Bruising, spontaneous 12/08/2013   Cancer (HCC)    cervical cancer - had hysterectomy   Chronic back pain    Chronic back pain    DDD (degenerative disc disease), lumbar    DDD (degenerative disc disease), lumbar    Dysrhythmia    tachycardia   Fibromyalgia    Headache(784.0)    migraines   History of kidney stones    Ovarian cyst    Partial tear subscapularis tendon 12/27/2011   Scoliosis    Seizures (HCC)    started 9/12-   Past Surgical History:  Procedure Laterality Date   ABDOMINAL HYSTERECTOMY  2012   BACK SURGERY  1989   scoliosis throsic-rods   CESAREAN SECTION     CHOLECYSTECTOMY N/A 11/17/2012   Procedure: LAPAROSCOPIC CHOLECYSTECTOMY WITH INTRAOPERATIVE CHOLANGIOGRAM;  Surgeon: Lynda Leos, MD;  Location: [facility];  Service: General;  Laterality: N/A;   COLONOSCOPY WITH PROPOFOL  N/A 04/21/2020   Procedure: COLONOSCOPY WITH PROPOFOL ;  Surgeon: Rollin Dover, MD;  Location: [facility];  Service: Endoscopy;  Laterality: N/A;   ENDOSCOPIC RETROGRADE CHOLANGIOPANCREATOGRAPHY (ERCP) WITH PROPOFOL  N/A 06/11/2019   Procedure: ENDOSCOPIC RETROGRADE  CHOLANGIOPANCREATOGRAPHY (ERCP) WITH PROPOFOL ;  Surgeon: Rollin Dover, MD;  Location: [facility];  Service: Endoscopy;  Laterality: N/A;   ERCP N/A 05/07/2013   Procedure: ENDOSCOPIC RETROGRADE CHOLANGIOPANCREATOGRAPHY (ERCP);  Surgeon: Dover JONETTA Rollin, MD;  Location: [facility];  Service: Endoscopy;  Laterality: N/A;  start ercp first, if canulation fails switch to EUS    ESOPHAGOGASTRODUODENOSCOPY (EGD) WITH PROPOFOL  N/A 05/15/2019   Procedure: ESOPHAGOGASTRODUODENOSCOPY (EGD) WITH PROPOFOL ;  Surgeon: Harvey Margo CROME, MD;  Location: [facility];  Service: Endoscopy;  Laterality: N/A;   ESOPHAGOGASTRODUODENOSCOPY (EGD) WITH PROPOFOL  N/A 03/03/2020   Procedure: ESOPHAGOGASTRODUODENOSCOPY (EGD) WITH PROPOFOL ;  Surgeon: Rollin Dover, MD;  Location: [facility];  Service: Endoscopy;  Laterality: N/A;   ESOPHAGOGASTRODUODENOSCOPY (EGD) WITH PROPOFOL  N/A 04/21/2020   Procedure: ESOPHAGOGASTRODUODENOSCOPY (EGD) WITH PROPOFOL ;  Surgeon: Rollin Dover, MD;  Location: [facility];  Service: Endoscopy;  Laterality: N/A;   EUS N/A 11/03/2012   Procedure: UPPER ENDOSCOPIC ULTRASOUND (EUS) LINEAR;  Surgeon: Dover JONETTA Rollin, MD;  Location: [facility];  Service: Endoscopy;  Laterality: N/A;   left arm     NECK SURGERY     POLYPECTOMY  04/21/2020   Procedure: POLYPECTOMY;  Surgeon: Rollin Dover, MD;  Location: [facility];  Service: Endoscopy;;   REMOVAL OF STONES  06/11/2019   Procedure: REMOVAL OF STONES;  Surgeon: Rollin Dover, MD;  Location:  [facility];  Service: Endoscopy;;   TOOTH EXTRACTION N/A 05/24/2022   Procedure: DENTAL RESTORATION/EXTRACTIONS;  Surgeon: Sheryle Hamilton, DMD;  Location: [facility];  Service: Oral Surgery;  Laterality: N/A;   TOTAL HIP ARTHROPLASTY Left 11/17/2022   Procedure: TOTAL HIP ARTHROPLASTY ANTERIOR APPROACH;  Surgeon: Beverley Evalene BIRCH, MD;  Location: [facility];  Service: Orthopedics;  Laterality: Left;   TOTAL SHOULDER ARTHROPLASTY Left 11/04/2023   Procedure:  ARTHROPLASTY, SHOULDER, TOTAL;  Surgeon: Josefina Chew, MD;  Location: [facility];  Service: Orthopedics;  Laterality: Left;   TUBAL LIGATION     UPPER ESOPHAGEAL ENDOSCOPIC ULTRASOUND (EUS) N/A 06/11/2019   Procedure: UPPER ESOPHAGEAL ENDOSCOPIC ULTRASOUND (EUS);  Surgeon: Rollin Dover, MD;  Location: [facility];  Service: Endoscopy;  Laterality: N/A;   Patient Active Problem List   Diagnosis Date Noted   S/P reverse total shoulder arthroplasty, left 11/04/2023   Vertigo 05/28/2023   Palpitations 12/05/2022   Transaminitis 11/16/2022   Hip fracture (HCC) 11/15/2022   GERD (gastroesophageal reflux disease) 11/15/2022   Tobacco use 11/15/2022   Idiopathic hypotension 07/19/2022   Slurred speech 07/19/2022   Underweight 07/19/2022   Hypotension 07/19/2022   Seizure disorder (HCC) 07/18/2022   Chronic migraine w/o aura w/o status migrainosus, not intractable 07/18/2022   Cerebral cavernoma 07/18/2022   Acute GI bleed due to bleeding pyloric/gastric ulcer 05/15/2019   PUD (peptic ulcer disease)-EGD 05/15/2019 with bleeding pyloric/gastric ulcer, nonbleeding duodenal ulcers and gastritis    Hematemesis with nausea    Migraine headache 09/25/2015   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   Bilateral occipital neuralgia 01/02/2015   Migraine 01/02/2015   Chest pain of uncertain etiology 11/29/2014   DDD (degenerative disc disease), cervical 10/27/2014   DDD (degenerative disc disease), 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   DDD (degenerative disc disease), lumbosacral 10/27/2014   Hypokalemia 05/07/2013   Partial tear subscapularis tendon 12/27/2011    PCP: Dr. Jerilynn Carnes REFERRING PROVIDER: Dr. Chew Josefina  ONSET DATE: 11/04/23  REFERRING DIAG: s/p left reverse TSA  THERAPY DIAG:  Acute pain of left shoulder  Stiffness of left shoulder, not elsewhere classified  Other symptoms and signs involving the musculoskeletal system  Rationale for Evaluation and Treatment:  Rehabilitation  SUBJECTIVE:   SUBJECTIVE STATEMENT: S: It's feeling alright   PRECAUTIONS: Shoulder-see protocol  WEIGHT BEARING RESTRICTIONS: Yes NWB  PAIN:  Are you having pain? Yes: NPRS scale: 1/10 Pain location: elbow and shoulder Pain description: sore Aggravating factors: falling on it Relieving factors: rest   FALLS: Has patient fallen in last 6 months? No  PLOF: Independent  PATIENT GOALS: To have less pain and more mobility.  NEXT MD VISIT: Sept 5th 2025  OBJECTIVE:   HAND DOMINANCE: Right  ADLs: Overall ADLs: Pt is unable to use the LUE for any ADLs at this time. Pt reports difficulty with sleeping, waking up a lot, sleeping in the recliner, tried the bed last night with a little better sleep. Pt is unable to reach overhead or behind back, cannot lift items.   FUNCTIONAL OUTCOME MEASURES: Upper Extremity Functional Scale (UEFS): 8/80=10% 01/08/24: 29/80=36.6%   UPPER EXTREMITY ROM:       Assessed in supine, er/IR adducted  Passive ROM Left eval Left 12/11/23 Left 01/08/24  Shoulder flexion 61 129 147  Shoulder abduction 62 131 144  Shoulder internal rotation 90 90 90  Shoulder external rotation 0 50 80  (Blank rows = not tested)    Assessed in seated, er/IR adducted  Active ROM Left eval  Left 01/08/24  Shoulder flexion  131  Shoulder abduction  144  Shoulder internal rotation  90  Shoulder external rotation  56  (Blank rows = not tested)  UPPER EXTREMITY MMT:     Not assessed due to protocol & pain  MMT Left eval Left 01/08/24  Shoulder flexion  4+/5  Shoulder abduction  4/5  Shoulder internal rotation  4-/5  Shoulder external rotation  4+/5  (Blank rows = not tested)   SENSATION: Pt reports intermittent numbness in the elbow  OBSERVATIONS: pt with mod fascial restrictions along upper arm, anterior shoulder, trapezius and scapular regions   TODAY'S TREATMENT:                                                                                                                               DATE:   01/26/24 -Manual techniques: to upper arm, pectoralis region, and trapezius; mod fascial restrictions- manual completed to reduce fascial restrictions, decrease pain, and improve ROM -AA/ROM: supine, flexion, abduction, protraction, horizontal abduction, er/IR, x15 -A/ROM: seated, flexion, abduction, protraction, horizontal abduction, er/IR, x15 -Shoulder Theraband: red band, flexion, abduction, er, IR, horizontal abduction, x10 -Scapular Theraband: green-row, extension, retraction, 15 reps -ABC's on the wall x2: green ball  01/22/24 -Manual techniques: to upper arm, pectoralis region, and trapezius; mod fascial restrictions- manual completed to reduce fascial restrictions, decrease pain, and improve ROM -P/ROM: supine, shoulder flexion, abduction, internal/external rotation, abduction, 5 reps -A/ROM: supine-protraction, flexion, er, horizontal abduction, abduction, 15 reps -Proximal shoulder strengthening: supine-paddles, criss cross, circles each direction, 12 reps -Scapular Theraband: green-row, extension, retraction, 15 reps -Shoulder Theraband: yellow- horizontal abduction, er, IR, 15 reps  01/20/24 -Manual techniques: to upper arm, pectoralis region, and trapezius; mod fascial restrictions- manual completed to reduce fascial restrictions, decrease pain, and improve ROM -P/ROM: supine, shoulder flexion, abduction, internal/external rotation, abduction, 5 reps -A/ROM: supine-protraction, flexion, er, horizontal abduction, abduction, 15 reps -Proximal shoulder strengthening: supine-paddles, criss cross, circles each direction, 12 reps -Shoulder stretches: flexion, doorway stretch, 3x20 holds -Functional reaching: placing 10 cones from middle shelf of overhead cabinet in flexion, removing in abduction -Scapular Theraband: red-row, extension, retraction, 12 reps -Overhead lacing: seated, lacing from top down then reversing -UBE:  level 2, 3' forward, 3' reverse, pace: 4.5    PATIENT EDUCATION: Education details: Shoulder theraband Person educated: Patient Education method: Explanation, Demonstration, and Handouts Education comprehension: verbalized understanding and returned demonstration  HOME EXERCISE PROGRAM: Eval: pendulums, table slides (no abduction) wrist A/ROM, elbow self-ROM 12/04/23: pulleys 3x/day, for 1-2' each 12/11/23: AA/ROM 01/01/24: A/ROM 01/15/24: red scapular theraband 8/4: Shoulder Theraband  GOALS: Goals reviewed with patient? Yes   SHORT TERM GOALS: Target date: 12/12/23  Pt will be provided with and educated on HEP to improve mobility in LUE required for use during ADL completion.   Goal status: IN PROGRESS  2.  Pt will increase LUE P/ROM by 50+ degrees to improve ability to use LUE during dressing tasks with minimal  compensatory techniques.   Goal status: MET  3.  Pt will increase LUE strength to 3+/5 to improve ability to reach for items at waist to chest height during bathing and grooming tasks.   Goal status: IN PROGRESS   LONG TERM GOALS: Target date: 01/11/24  Pt will decrease pain in LUE to 3/10 or less to improve ability to sleep for 2+ consecutive hours without waking due to pain.   Goal status: IN PROGRESS  2.  Pt will decrease LUE fascial restrictions to min amounts or less to improve mobility required for functional reaching tasks.   Goal status: IN PROGRESS  3.  Pt will increase LUE A/ROM by 75+ degrees to improve ability to use LUE when reaching overhead or behind back during dressing and bathing tasks.   Goal status: IN PROGRESS  4.  Pt will increase LUE strength to 4+/5 or greater to improve ability to use LUE when lifting or carrying items during meal preparation/housework/yardwork tasks.   Goal status: IN PROGRESS  5.  Pt will return to highest level of function using LUE as non-dominant during functional task completion.   Goal status: IN  PROGRESS   ASSESSMENT:  CLINICAL IMPRESSION: This session pt continuing to demonstrate improving mobility. In sitting she is achieving approximately 60% of full ROM. She then continued to work on strengthening, where she required multiple rest breaks due to fatigue. Overall endurance is improving. Verbal and tactile cuing provided for positioning and technique throughout session.    PERFORMANCE DEFICITS: in functional skills including in functional skills including ADLs, IADLs, coordination, tone, ROM, strength, pain, fascial restrictions, muscle spasms, and UE functional use     PLAN:  OT FREQUENCY: 2x/week  OT DURATION: 8 weeks  PLANNED INTERVENTIONS: 97168 OT Re-evaluation, 97535 self care/ADL training, 02889 therapeutic exercise, 97530 therapeutic activity, 97112 neuromuscular re-education, 97140 manual therapy, 97014 electrical stimulation unattended, patient/family education, and DME and/or AE instructions  CONSULTED AND AGREED WITH PLAN OF CARE: Patient  PLAN FOR NEXT SESSION: manual techniques prn, A/ROM, shoulder stretches, functional reaching, light strengthening ok, less than 3#    Valentin Nightingale, OTR/L [phone] 01/26/2024, 3:09 PM

## 2024-01-29 ENCOUNTER — Encounter (HOSPITAL_COMMUNITY): Payer: Self-pay | Admitting: Occupational Therapy

## 2024-01-29 ENCOUNTER — Ambulatory Visit (HOSPITAL_COMMUNITY): Admitting: Occupational Therapy

## 2024-01-29 DIAGNOSIS — M25612 Stiffness of left shoulder, not elsewhere classified: Secondary | ICD-10-CM | POA: Diagnosis not present

## 2024-01-29 DIAGNOSIS — R29898 Other symptoms and signs involving the musculoskeletal system: Secondary | ICD-10-CM | POA: Diagnosis not present

## 2024-01-29 DIAGNOSIS — M25512 Pain in left shoulder: Secondary | ICD-10-CM

## 2024-01-29 NOTE — Patient Instructions (Signed)

## 2024-01-30 NOTE — Therapy (Signed)
 OUTPATIENT OCCUPATIONAL THERAPY ORTHO TREATMENT   [name] MRN: [id number] DOB:14-Dec-1970, 53 y.o., female Today's Date: 01/30/2024  Progress Note Reporting Period 01/08/24 to 01/26/24  See note below for Objective Data and Assessment of Progress/Goals.    END OF SESSION:  OT End of Session - 01/29/24 1656     Visit Number 21    Number of Visits 23    Date for OT Re-Evaluation 02/06/24    Authorization Type 1) [employer] Dual Complete Medicare 2) [place] [employer]    Authorization Time Period no auth required    Progress Note Due on Visit 20    OT Start Time 1615    OT Stop Time 1656    OT Time Calculation (min) 41 min    Activity Tolerance Patient tolerated treatment well    Behavior During Therapy WFL for tasks assessed/performed          Past Medical History:  Diagnosis Date   Asthma    seasonal   Bruising, spontaneous 12/08/2013   Cancer (HCC)    cervical cancer - had hysterectomy   Chronic back pain    Chronic back pain    DDD (degenerative disc disease), lumbar    DDD (degenerative disc disease), lumbar    Dysrhythmia    tachycardia   Fibromyalgia    Headache(784.0)    migraines   History of kidney stones    Ovarian cyst    Partial tear subscapularis tendon 12/27/2011   Scoliosis    Seizures (HCC)    started 9/12-   Past Surgical History:  Procedure Laterality Date   ABDOMINAL HYSTERECTOMY  2012   BACK SURGERY  1989   scoliosis throsic-rods   CESAREAN SECTION     CHOLECYSTECTOMY N/A 11/17/2012   Procedure: LAPAROSCOPIC CHOLECYSTECTOMY WITH INTRAOPERATIVE CHOLANGIOGRAM;  Surgeon: Lynda Leos, MD;  Location: [facility];  Service: General;  Laterality: N/A;   COLONOSCOPY WITH PROPOFOL  N/A 04/21/2020   Procedure: COLONOSCOPY WITH PROPOFOL ;  Surgeon: Rollin Dover, MD;  Location: [facility];  Service: Endoscopy;  Laterality: N/A;   ENDOSCOPIC RETROGRADE CHOLANGIOPANCREATOGRAPHY (ERCP) WITH PROPOFOL  N/A 06/11/2019   Procedure: ENDOSCOPIC RETROGRADE  CHOLANGIOPANCREATOGRAPHY (ERCP) WITH PROPOFOL ;  Surgeon: Rollin Dover, MD;  Location: [facility];  Service: Endoscopy;  Laterality: N/A;   ERCP N/A 05/07/2013   Procedure: ENDOSCOPIC RETROGRADE CHOLANGIOPANCREATOGRAPHY (ERCP);  Surgeon: Dover JONETTA Rollin, MD;  Location: [facility];  Service: Endoscopy;  Laterality: N/A;  start ercp first, if canulation fails switch to EUS    ESOPHAGOGASTRODUODENOSCOPY (EGD) WITH PROPOFOL  N/A 05/15/2019   Procedure: ESOPHAGOGASTRODUODENOSCOPY (EGD) WITH PROPOFOL ;  Surgeon: Harvey Margo CROME, MD;  Location: [facility];  Service: Endoscopy;  Laterality: N/A;   ESOPHAGOGASTRODUODENOSCOPY (EGD) WITH PROPOFOL  N/A 03/03/2020   Procedure: ESOPHAGOGASTRODUODENOSCOPY (EGD) WITH PROPOFOL ;  Surgeon: Rollin Dover, MD;  Location: [facility];  Service: Endoscopy;  Laterality: N/A;   ESOPHAGOGASTRODUODENOSCOPY (EGD) WITH PROPOFOL  N/A 04/21/2020   Procedure: ESOPHAGOGASTRODUODENOSCOPY (EGD) WITH PROPOFOL ;  Surgeon: Rollin Dover, MD;  Location: [facility];  Service: Endoscopy;  Laterality: N/A;   EUS N/A 11/03/2012   Procedure: UPPER ENDOSCOPIC ULTRASOUND (EUS) LINEAR;  Surgeon: Dover JONETTA Rollin, MD;  Location: [facility];  Service: Endoscopy;  Laterality: N/A;   left arm     NECK SURGERY     POLYPECTOMY  04/21/2020   Procedure: POLYPECTOMY;  Surgeon: Rollin Dover, MD;  Location: [facility];  Service: Endoscopy;;   REMOVAL OF STONES  06/11/2019   Procedure: REMOVAL OF STONES;  Surgeon: Rollin Dover, MD;  Location:  [facility];  Service: Endoscopy;;   TOOTH EXTRACTION N/A 05/24/2022   Procedure: DENTAL RESTORATION/EXTRACTIONS;  Surgeon: Sheryle Hamilton, DMD;  Location: [facility];  Service: Oral Surgery;  Laterality: N/A;   TOTAL HIP ARTHROPLASTY Left 11/17/2022   Procedure: TOTAL HIP ARTHROPLASTY ANTERIOR APPROACH;  Surgeon: Beverley Evalene BIRCH, MD;  Location: [facility];  Service: Orthopedics;  Laterality: Left;   TOTAL SHOULDER ARTHROPLASTY Left 11/04/2023   Procedure:  ARTHROPLASTY, SHOULDER, TOTAL;  Surgeon: Josefina Chew, MD;  Location: [facility];  Service: Orthopedics;  Laterality: Left;   TUBAL LIGATION     UPPER ESOPHAGEAL ENDOSCOPIC ULTRASOUND (EUS) N/A 06/11/2019   Procedure: UPPER ESOPHAGEAL ENDOSCOPIC ULTRASOUND (EUS);  Surgeon: Rollin Dover, MD;  Location: [facility];  Service: Endoscopy;  Laterality: N/A;   Patient Active Problem List   Diagnosis Date Noted   S/P reverse total shoulder arthroplasty, left 11/04/2023   Vertigo 05/28/2023   Palpitations 12/05/2022   Transaminitis 11/16/2022   Hip fracture (HCC) 11/15/2022   GERD (gastroesophageal reflux disease) 11/15/2022   Tobacco use 11/15/2022   Idiopathic hypotension 07/19/2022   Slurred speech 07/19/2022   Underweight 07/19/2022   Hypotension 07/19/2022   Seizure disorder (HCC) 07/18/2022   Chronic migraine w/o aura w/o status migrainosus, not intractable 07/18/2022   Cerebral cavernoma 07/18/2022   Acute GI bleed due to bleeding pyloric/gastric ulcer 05/15/2019   PUD (peptic ulcer disease)-EGD 05/15/2019 with bleeding pyloric/gastric ulcer, nonbleeding duodenal ulcers and gastritis    Hematemesis with nausea    Migraine headache 09/25/2015   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   Bilateral occipital neuralgia 01/02/2015   Migraine 01/02/2015   Chest pain of uncertain etiology 11/29/2014   DDD (degenerative disc disease), cervical 10/27/2014   DDD (degenerative disc disease), 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   DDD (degenerative disc disease), lumbosacral 10/27/2014   Hypokalemia 05/07/2013   Partial tear subscapularis tendon 12/27/2011    PCP: Dr. Jerilynn Carnes REFERRING PROVIDER: Dr. Chew Josefina  ONSET DATE: 11/04/23  REFERRING DIAG: s/p left reverse TSA  THERAPY DIAG:  Acute pain of left shoulder  Stiffness of left shoulder, not elsewhere classified  Other symptoms and signs involving the musculoskeletal system  Rationale for Evaluation and Treatment:  Rehabilitation  SUBJECTIVE:   SUBJECTIVE STATEMENT: S: It's feeling alright   PRECAUTIONS: Shoulder-see protocol  WEIGHT BEARING RESTRICTIONS: Yes NWB  PAIN:  Are you having pain? Yes: NPRS scale: 1/10 Pain location: elbow and shoulder Pain description: sore Aggravating factors: falling on it Relieving factors: rest   FALLS: Has patient fallen in last 6 months? No  PLOF: Independent  PATIENT GOALS: To have less pain and more mobility.  NEXT MD VISIT: Sept 5th 2025  OBJECTIVE:   HAND DOMINANCE: Right  ADLs: Overall ADLs: Pt is unable to use the LUE for any ADLs at this time. Pt reports difficulty with sleeping, waking up a lot, sleeping in the recliner, tried the bed last night with a little better sleep. Pt is unable to reach overhead or behind back, cannot lift items.   FUNCTIONAL OUTCOME MEASURES: Upper Extremity Functional Scale (UEFS): 8/80=10% 01/08/24: 29/80=36.6%   UPPER EXTREMITY ROM:       Assessed in supine, er/IR adducted  Passive ROM Left eval Left 12/11/23 Left 01/08/24  Shoulder flexion 61 129 147  Shoulder abduction 62 131 144  Shoulder internal rotation 90 90 90  Shoulder external rotation 0 50 80  (Blank rows = not tested)    Assessed in seated, er/IR adducted  Active ROM Left eval  Left 01/08/24  Shoulder flexion  131  Shoulder abduction  144  Shoulder internal rotation  90  Shoulder external rotation  56  (Blank rows = not tested)  UPPER EXTREMITY MMT:     Not assessed due to protocol & pain  MMT Left eval Left 01/08/24  Shoulder flexion  4+/5  Shoulder abduction  4/5  Shoulder internal rotation  4-/5  Shoulder external rotation  4+/5  (Blank rows = not tested)   SENSATION: Pt reports intermittent numbness in the elbow  OBSERVATIONS: pt with mod fascial restrictions along upper arm, anterior shoulder, trapezius and scapular regions   TODAY'S TREATMENT:                                                                                                                               DATE:   01/29/24 -Manual techniques: to upper arm, pectoralis region, and trapezius; mod fascial restrictions- manual completed to reduce fascial restrictions, decrease pain, and improve ROM -AA/ROM: supine, flexion, abduction, protraction, horizontal abduction, er/IR, x15 -A/ROM: seated, flexion, abduction, protraction, horizontal abduction, er/IR, x15 -Proximal shoulder strengthening: supine-paddles, criss cross, circles each direction, 12 reps -Strengthening: seated, 1#, flexion, abduction, protraction, horizontal abduction, er/IR, x10 -PNF strengthening: chest pulls, overhead pulls, er pulls, PNF up, PNF down. x10  01/26/24 -Manual techniques: to upper arm, pectoralis region, and trapezius; mod fascial restrictions- manual completed to reduce fascial restrictions, decrease pain, and improve ROM -AA/ROM: supine, flexion, abduction, protraction, horizontal abduction, er/IR, x15 -A/ROM: seated, flexion, abduction, protraction, horizontal abduction, er/IR, x15 -Shoulder Theraband: red band, flexion, abduction, er, IR, horizontal abduction, x10 -Scapular Theraband: green-row, extension, retraction, 15 reps -ABC's on the wall x2: green ball  01/22/24 -Manual techniques: to upper arm, pectoralis region, and trapezius; mod fascial restrictions- manual completed to reduce fascial restrictions, decrease pain, and improve ROM -P/ROM: supine, shoulder flexion, abduction, internal/external rotation, abduction, 5 reps -A/ROM: supine-protraction, flexion, er, horizontal abduction, abduction, 15 reps -Proximal shoulder strengthening: supine-paddles, criss cross, circles each direction, 12 reps -Scapular Theraband: green-row, extension, retraction, 15 reps -Shoulder Theraband: yellow- horizontal abduction, er, IR, 15 reps   PATIENT EDUCATION: Education details: PNF Strengthening Person educated: Patient Education method: Programmer, multimedia, Demonstration,  and Handouts Education comprehension: verbalized understanding and returned demonstration  HOME EXERCISE PROGRAM: Eval: pendulums, table slides (no abduction) wrist A/ROM, elbow self-ROM 12/04/23: pulleys 3x/day, for 1-2' each 12/11/23: AA/ROM 01/01/24: A/ROM 01/15/24: red scapular theraband 8/4: Shoulder Theraband 8/7: PNF strengthening  GOALS: Goals reviewed with patient? Yes   SHORT TERM GOALS: Target date: 12/12/23  Pt will be provided with and educated on HEP to improve mobility in LUE required for use during ADL completion.   Goal status: IN PROGRESS  2.  Pt will increase LUE P/ROM by 50+ degrees to improve ability to use LUE during dressing tasks with minimal compensatory techniques.   Goal status: MET  3.  Pt will increase LUE strength to 3+/5 to improve ability to reach for items  at waist to chest height during bathing and grooming tasks.   Goal status: IN PROGRESS   LONG TERM GOALS: Target date: 01/11/24  Pt will decrease pain in LUE to 3/10 or less to improve ability to sleep for 2+ consecutive hours without waking due to pain.   Goal status: IN PROGRESS  2.  Pt will decrease LUE fascial restrictions to min amounts or less to improve mobility required for functional reaching tasks.   Goal status: IN PROGRESS  3.  Pt will increase LUE A/ROM by 75+ degrees to improve ability to use LUE when reaching overhead or behind back during dressing and bathing tasks.   Goal status: IN PROGRESS  4.  Pt will increase LUE strength to 4+/5 or greater to improve ability to use LUE when lifting or carrying items during meal preparation/housework/yardwork tasks.   Goal status: IN PROGRESS  5.  Pt will return to highest level of function using LUE as non-dominant during functional task completion.   Goal status: IN PROGRESS   ASSESSMENT:  CLINICAL IMPRESSION: Pt presenting with increased stiffness and low energy at the beginning of the session. She slowly improved her  mobility as she complete exercises and moved her arm more. OT added PNF Strengthening this session for continued functional strengthening and improving mobility. Verbal and tactile cuing provided for positioning and technique throughout session.    PERFORMANCE DEFICITS: in functional skills including in functional skills including ADLs, IADLs, coordination, tone, ROM, strength, pain, fascial restrictions, muscle spasms, and UE functional use     PLAN:  OT FREQUENCY: 2x/week  OT DURATION: 8 weeks  PLANNED INTERVENTIONS: 97168 OT Re-evaluation, 97535 self care/ADL training, 02889 therapeutic exercise, 97530 therapeutic activity, 97112 neuromuscular re-education, 97140 manual therapy, 97014 electrical stimulation unattended, patient/family education, and DME and/or AE instructions  CONSULTED AND AGREED WITH PLAN OF CARE: Patient  PLAN FOR NEXT SESSION: manual techniques prn, A/ROM, shoulder stretches, functional reaching, light strengthening ok, less than 3#    Valentin Nightingale, OTR/L [phone] 01/30/2024, 2:46 PM

## 2024-02-03 ENCOUNTER — Ambulatory Visit (HOSPITAL_COMMUNITY): Admitting: Occupational Therapy

## 2024-02-03 ENCOUNTER — Encounter (HOSPITAL_COMMUNITY): Admitting: Occupational Therapy

## 2024-02-03 ENCOUNTER — Encounter (HOSPITAL_COMMUNITY): Payer: Self-pay | Admitting: Occupational Therapy

## 2024-02-03 DIAGNOSIS — M25612 Stiffness of left shoulder, not elsewhere classified: Secondary | ICD-10-CM | POA: Diagnosis not present

## 2024-02-03 DIAGNOSIS — R29898 Other symptoms and signs involving the musculoskeletal system: Secondary | ICD-10-CM

## 2024-02-03 DIAGNOSIS — M25512 Pain in left shoulder: Secondary | ICD-10-CM

## 2024-02-03 NOTE — Therapy (Signed)
 OUTPATIENT OCCUPATIONAL THERAPY ORTHO TREATMENT   [name] MRN: [id number] DOB:April 06, 1971, 53 y.o., female Today's Date: 02/04/2024   END OF SESSION:  OT End of Session - 02/03/24 1608     Visit Number 22    Number of Visits 23    Date for OT Re-Evaluation 02/06/24    Authorization Type 1) [employer] Dual Complete Medicare 2) [place] Medicaid    Authorization Time Period no auth required    Progress Note Due on Visit 20    OT Start Time 1526    OT Stop Time 1608    OT Time Calculation (min) 42 min    Activity Tolerance Patient tolerated treatment well    Behavior During Therapy WFL for tasks assessed/performed           Past Medical History:  Diagnosis Date   Asthma    seasonal   Bruising, spontaneous 12/08/2013   Cancer (HCC)    cervical cancer - had hysterectomy   Chronic back pain    Chronic back pain    DDD (degenerative disc disease), lumbar    DDD (degenerative disc disease), lumbar    Dysrhythmia    tachycardia   Fibromyalgia    Headache(784.0)    migraines   History of kidney stones    Ovarian cyst    Partial tear subscapularis tendon 12/27/2011   Scoliosis    Seizures (HCC)    started 9/12-   Past Surgical History:  Procedure Laterality Date   ABDOMINAL HYSTERECTOMY  2012   BACK SURGERY  1989   scoliosis throsic-rods   CESAREAN SECTION     CHOLECYSTECTOMY N/A 11/17/2012   Procedure: LAPAROSCOPIC CHOLECYSTECTOMY WITH INTRAOPERATIVE CHOLANGIOGRAM;  Surgeon: Lynda Leos, MD;  Location: [facility];  Service: General;  Laterality: N/A;   COLONOSCOPY WITH PROPOFOL  N/A 04/21/2020   Procedure: COLONOSCOPY WITH PROPOFOL ;  Surgeon: Rollin Dover, MD;  Location: [facility];  Service: Endoscopy;  Laterality: N/A;   ENDOSCOPIC RETROGRADE CHOLANGIOPANCREATOGRAPHY (ERCP) WITH PROPOFOL  N/A 06/11/2019   Procedure: ENDOSCOPIC RETROGRADE CHOLANGIOPANCREATOGRAPHY (ERCP) WITH PROPOFOL ;  Surgeon: Rollin Dover, MD;  Location: [facility];  Service: Endoscopy;   Laterality: N/A;   ERCP N/A 05/07/2013   Procedure: ENDOSCOPIC RETROGRADE CHOLANGIOPANCREATOGRAPHY (ERCP);  Surgeon: Dover JONETTA Rollin, MD;  Location: [facility];  Service: Endoscopy;  Laterality: N/A;  start ercp first, if canulation fails switch to EUS    ESOPHAGOGASTRODUODENOSCOPY (EGD) WITH PROPOFOL  N/A 05/15/2019   Procedure: ESOPHAGOGASTRODUODENOSCOPY (EGD) WITH PROPOFOL ;  Surgeon: Harvey Margo CROME, MD;  Location: [facility];  Service: Endoscopy;  Laterality: N/A;   ESOPHAGOGASTRODUODENOSCOPY (EGD) WITH PROPOFOL  N/A 03/03/2020   Procedure: ESOPHAGOGASTRODUODENOSCOPY (EGD) WITH PROPOFOL ;  Surgeon: Rollin Dover, MD;  Location: [facility];  Service: Endoscopy;  Laterality: N/A;   ESOPHAGOGASTRODUODENOSCOPY (EGD) WITH PROPOFOL  N/A 04/21/2020   Procedure: ESOPHAGOGASTRODUODENOSCOPY (EGD) WITH PROPOFOL ;  Surgeon: Rollin Dover, MD;  Location: [facility];  Service: Endoscopy;  Laterality: N/A;   EUS N/A 11/03/2012   Procedure: UPPER ENDOSCOPIC ULTRASOUND (EUS) LINEAR;  Surgeon: Dover JONETTA Rollin, MD;  Location: [facility];  Service: Endoscopy;  Laterality: N/A;   left arm     NECK SURGERY     POLYPECTOMY  04/21/2020   Procedure: POLYPECTOMY;  Surgeon: Rollin Dover, MD;  Location: [facility];  Service: Endoscopy;;   REMOVAL OF STONES  06/11/2019   Procedure: REMOVAL OF STONES;  Surgeon: Rollin Dover, MD;  Location: [facility];  Service: Endoscopy;;   TOOTH EXTRACTION N/A 05/24/2022   Procedure: DENTAL RESTORATION/EXTRACTIONS;  Surgeon: Sheryle,  Glendia, DMD;  Location: [facility];  Service: Oral Surgery;  Laterality: N/A;   TOTAL HIP ARTHROPLASTY Left 11/17/2022   Procedure: TOTAL HIP ARTHROPLASTY ANTERIOR APPROACH;  Surgeon: Beverley Evalene BIRCH, MD;  Location: [facility];  Service: Orthopedics;  Laterality: Left;   TOTAL SHOULDER ARTHROPLASTY Left 11/04/2023   Procedure: ARTHROPLASTY, SHOULDER, TOTAL;  Surgeon: Josefina Chew, MD;  Location: [facility];  Service: Orthopedics;  Laterality: Left;   TUBAL  LIGATION     UPPER ESOPHAGEAL ENDOSCOPIC ULTRASOUND (EUS) N/A 06/11/2019   Procedure: UPPER ESOPHAGEAL ENDOSCOPIC ULTRASOUND (EUS);  Surgeon: Rollin Dover, MD;  Location: [facility];  Service: Endoscopy;  Laterality: N/A;   Patient Active Problem List   Diagnosis Date Noted   S/P reverse total shoulder arthroplasty, left 11/04/2023   Vertigo 05/28/2023   Palpitations 12/05/2022   Transaminitis 11/16/2022   Hip fracture (HCC) 11/15/2022   GERD (gastroesophageal reflux disease) 11/15/2022   Tobacco use 11/15/2022   Idiopathic hypotension 07/19/2022   Slurred speech 07/19/2022   Underweight 07/19/2022   Hypotension 07/19/2022   Seizure disorder (HCC) 07/18/2022   Chronic migraine w/o aura w/o status migrainosus, not intractable 07/18/2022   Cerebral cavernoma 07/18/2022   Acute GI bleed due to bleeding pyloric/gastric ulcer 05/15/2019   PUD (peptic ulcer disease)-EGD 05/15/2019 with bleeding pyloric/gastric ulcer, nonbleeding duodenal ulcers and gastritis    Hematemesis with nausea    Migraine headache 09/25/2015   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   Bilateral occipital neuralgia 01/02/2015   Migraine 01/02/2015   Chest pain of uncertain etiology 11/29/2014   DDD (degenerative disc disease), cervical 10/27/2014   DDD (degenerative disc disease), 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   DDD (degenerative disc disease), lumbosacral 10/27/2014   Hypokalemia 05/07/2013   Partial tear subscapularis tendon 12/27/2011    PCP: Dr. Jerilynn Carnes REFERRING PROVIDER: Dr. Chew Josefina  ONSET DATE: 11/04/23  REFERRING DIAG: s/p left reverse TSA  THERAPY DIAG:  Acute pain of left shoulder  Stiffness of left shoulder, not elsewhere classified  Other symptoms and signs involving the musculoskeletal system  Rationale for Evaluation and Treatment: Rehabilitation  SUBJECTIVE:   SUBJECTIVE STATEMENT: S: It's feeling alright   PRECAUTIONS: Shoulder-see protocol  WEIGHT BEARING  RESTRICTIONS: Yes NWB  PAIN:  Are you having pain? Yes: NPRS scale: 1/10 Pain location: elbow and shoulder Pain description: sore Aggravating factors: falling on it Relieving factors: rest   FALLS: Has patient fallen in last 6 months? No  PLOF: Independent  PATIENT GOALS: To have less pain and more mobility.  NEXT MD VISIT: Sept 5th 2025  OBJECTIVE:   HAND DOMINANCE: Right  ADLs: Overall ADLs: Pt is unable to use the LUE for any ADLs at this time. Pt reports difficulty with sleeping, waking up a lot, sleeping in the recliner, tried the bed last night with a little better sleep. Pt is unable to reach overhead or behind back, cannot lift items.   FUNCTIONAL OUTCOME MEASURES: Upper Extremity Functional Scale (UEFS): 8/80=10% 01/08/24: 29/80=36.6%   UPPER EXTREMITY ROM:       Assessed in supine, er/IR adducted  Passive ROM Left eval Left 12/11/23 Left 01/08/24  Shoulder flexion 61 129 147  Shoulder abduction 62 131 144  Shoulder internal rotation 90 90 90  Shoulder external rotation 0 50 80  (Blank rows = not tested)    Assessed in seated, er/IR adducted  Active ROM Left eval Left 01/08/24  Shoulder flexion  131  Shoulder abduction  144  Shoulder internal rotation  90  Shoulder external rotation  56  (Blank rows = not tested)  UPPER EXTREMITY MMT:     Not assessed due to protocol & pain  MMT Left eval Left 01/08/24  Shoulder flexion  4+/5  Shoulder abduction  4/5  Shoulder internal rotation  4-/5  Shoulder external rotation  4+/5  (Blank rows = not tested)   SENSATION: Pt reports intermittent numbness in the elbow  OBSERVATIONS: pt with mod fascial restrictions along upper arm, anterior shoulder, trapezius and scapular regions   TODAY'S TREATMENT:                                                                                                                              DATE:   02/03/24 -Manual techniques: to upper arm, pectoralis region, and  trapezius; mod fascial restrictions- manual completed to reduce fascial restrictions, decrease pain, and improve ROM -A/ROM: seated, flexion, abduction, protraction, horizontal abduction, er/IR, x15 -X to V arms x15 -Goal Post arms x15 -Shoulder Theraband: red band, flexion, abduction, er, IR, horizontal abduction, x10 -Scapular Theraband: green-row, extension, retraction, 15 reps -UBE: level 1, 2.5' forwards and backwards  01/29/24 -Manual techniques: to upper arm, pectoralis region, and trapezius; mod fascial restrictions- manual completed to reduce fascial restrictions, decrease pain, and improve ROM -AA/ROM: supine, flexion, abduction, protraction, horizontal abduction, er/IR, x15 -A/ROM: seated, flexion, abduction, protraction, horizontal abduction, er/IR, x15 -Proximal shoulder strengthening: supine-paddles, criss cross, circles each direction, 12 reps -Strengthening: seated, 1#, flexion, abduction, protraction, horizontal abduction, er/IR, x10 -PNF strengthening: chest pulls, overhead pulls, er pulls, PNF up, PNF down. x10  01/26/24 -Manual techniques: to upper arm, pectoralis region, and trapezius; mod fascial restrictions- manual completed to reduce fascial restrictions, decrease pain, and improve ROM -AA/ROM: supine, flexion, abduction, protraction, horizontal abduction, er/IR, x15 -A/ROM: seated, flexion, abduction, protraction, horizontal abduction, er/IR, x15 -Shoulder Theraband: red band, flexion, abduction, er, IR, horizontal abduction, x10 -Scapular Theraband: green-row, extension, retraction, 15 reps -ABC's on the wall x2: green ball   PATIENT EDUCATION: Education details: Continue HEP Person educated: Patient Education method: Programmer, multimedia, Demonstration, and Handouts Education comprehension: verbalized understanding and returned demonstration  HOME EXERCISE PROGRAM: Eval: pendulums, table slides (no abduction) wrist A/ROM, elbow self-ROM 12/04/23: pulleys 3x/day, for  1-2' each 12/11/23: AA/ROM 01/01/24: A/ROM 01/15/24: red scapular theraband 8/4: Shoulder Theraband 8/7: PNF strengthening  GOALS: Goals reviewed with patient? Yes   SHORT TERM GOALS: Target date: 12/12/23  Pt will be provided with and educated on HEP to improve mobility in LUE required for use during ADL completion.   Goal status: IN PROGRESS  2.  Pt will increase LUE P/ROM by 50+ degrees to improve ability to use LUE during dressing tasks with minimal compensatory techniques.   Goal status: MET  3.  Pt will increase LUE strength to 3+/5 to improve ability to reach for items at waist to chest height during bathing and grooming tasks.   Goal status: IN PROGRESS   LONG TERM GOALS: Target  date: 01/11/24  Pt will decrease pain in LUE to 3/10 or less to improve ability to sleep for 2+ consecutive hours without waking due to pain.   Goal status: IN PROGRESS  2.  Pt will decrease LUE fascial restrictions to min amounts or less to improve mobility required for functional reaching tasks.   Goal status: IN PROGRESS  3.  Pt will increase LUE A/ROM by 75+ degrees to improve ability to use LUE when reaching overhead or behind back during dressing and bathing tasks.   Goal status: IN PROGRESS  4.  Pt will increase LUE strength to 4+/5 or greater to improve ability to use LUE when lifting or carrying items during meal preparation/housework/yardwork tasks.   Goal status: IN PROGRESS  5.  Pt will return to highest level of function using LUE as non-dominant during functional task completion.   Goal status: IN PROGRESS   ASSESSMENT:  CLINICAL IMPRESSION: This session pt continues to report mild pain and weakness. She continued to work on functional strengthening and ROM, where she appears near her baseline. Ended session with the UBE for endurance and reducing tension in her shoulder. OT providing verbal and tactile cuing for positioning and technique throughout session.      PERFORMANCE DEFICITS: in functional skills including in functional skills including ADLs, IADLs, coordination, tone, ROM, strength, pain, fascial restrictions, muscle spasms, and UE functional use     PLAN:  OT FREQUENCY: 2x/week  OT DURATION: 8 weeks  PLANNED INTERVENTIONS: 97168 OT Re-evaluation, 97535 self care/ADL training, 02889 therapeutic exercise, 97530 therapeutic activity, 97112 neuromuscular re-education, 97140 manual therapy, 97014 electrical stimulation unattended, patient/family education, and DME and/or AE instructions  CONSULTED AND AGREED WITH PLAN OF CARE: Patient  PLAN FOR NEXT SESSION: manual techniques prn, A/ROM, shoulder stretches, functional reaching, light strengthening ok, less than 3#    Valentin Nightingale, OTR/L [phone] 02/04/2024, 4:29 PM

## 2024-02-03 NOTE — Therapy (Unsigned)
 OUTPATIENT OCCUPATIONAL THERAPY ORTHO TREATMENT   [name] MRN: [id number] DOB:Mar 29, 1971, 53 y.o., female Today's Date: 02/04/2024   END OF SESSION:  OT End of Session - 02/03/24 1608     Visit Number 22    Number of Visits 23    Date for OT Re-Evaluation 02/06/24    Authorization Type 1) [employer] Dual Complete Medicare 2) [employer]    Authorization Time Period no auth required    Progress Note Due on Visit 20    OT Start Time 1526    OT Stop Time 1608    OT Time Calculation (min) 42 min    Activity Tolerance Patient tolerated treatment well    Behavior During Therapy WFL for tasks assessed/performed           Past Medical History:  Diagnosis Date   Asthma    seasonal   Bruising, spontaneous 12/08/2013   Cancer (HCC)    cervical cancer - had hysterectomy   Chronic back pain    Chronic back pain    DDD (degenerative disc disease), lumbar    DDD (degenerative disc disease), lumbar    Dysrhythmia    tachycardia   Fibromyalgia    Headache(784.0)    migraines   History of kidney stones    Ovarian cyst    Partial tear subscapularis tendon 12/27/2011   Scoliosis    Seizures (HCC)    started 9/12-   Past Surgical History:  Procedure Laterality Date   ABDOMINAL HYSTERECTOMY  2012   BACK SURGERY  1989   scoliosis throsic-rods   CESAREAN SECTION     CHOLECYSTECTOMY N/A 11/17/2012   Procedure: LAPAROSCOPIC CHOLECYSTECTOMY WITH INTRAOPERATIVE CHOLANGIOGRAM;  Surgeon: Lynda Leos, MD;  Location: [facility];  Service: General;  Laterality: N/A;   COLONOSCOPY WITH PROPOFOL  N/A 04/21/2020   Procedure: COLONOSCOPY WITH PROPOFOL ;  Surgeon: Rollin Dover, MD;  Location: [facility];  Service: Endoscopy;  Laterality: N/A;   ENDOSCOPIC RETROGRADE CHOLANGIOPANCREATOGRAPHY (ERCP) WITH PROPOFOL  N/A 06/11/2019   Procedure: ENDOSCOPIC RETROGRADE CHOLANGIOPANCREATOGRAPHY (ERCP) WITH PROPOFOL ;  Surgeon: Rollin Dover, MD;  Location: [facility];  Service: Endoscopy;   Laterality: N/A;   ERCP N/A 05/07/2013   Procedure: ENDOSCOPIC RETROGRADE CHOLANGIOPANCREATOGRAPHY (ERCP);  Surgeon: Dover JONETTA Rollin, MD;  Location: [facility];  Service: Endoscopy;  Laterality: N/A;  start ercp first, if canulation fails switch to EUS    ESOPHAGOGASTRODUODENOSCOPY (EGD) WITH PROPOFOL  N/A 05/15/2019   Procedure: ESOPHAGOGASTRODUODENOSCOPY (EGD) WITH PROPOFOL ;  Surgeon: Harvey Margo CROME, MD;  Location: [facility];  Service: Endoscopy;  Laterality: N/A;   ESOPHAGOGASTRODUODENOSCOPY (EGD) WITH PROPOFOL  N/A 03/03/2020   Procedure: ESOPHAGOGASTRODUODENOSCOPY (EGD) WITH PROPOFOL ;  Surgeon: Rollin Dover, MD;  Location: [facility];  Service: Endoscopy;  Laterality: N/A;   ESOPHAGOGASTRODUODENOSCOPY (EGD) WITH PROPOFOL  N/A 04/21/2020   Procedure: ESOPHAGOGASTRODUODENOSCOPY (EGD) WITH PROPOFOL ;  Surgeon: Rollin Dover, MD;  Location: [facility];  Service: Endoscopy;  Laterality: N/A;   EUS N/A 11/03/2012   Procedure: UPPER ENDOSCOPIC ULTRASOUND (EUS) LINEAR;  Surgeon: Dover JONETTA Rollin, MD;  Location: [facility];  Service: Endoscopy;  Laterality: N/A;   left arm     NECK SURGERY     POLYPECTOMY  04/21/2020   Procedure: POLYPECTOMY;  Surgeon: Rollin Dover, MD;  Location: [facility];  Service: Endoscopy;;   REMOVAL OF STONES  06/11/2019   Procedure: REMOVAL OF STONES;  Surgeon: Rollin Dover, MD;  Location: [facility];  Service: Endoscopy;;   TOOTH EXTRACTION N/A 05/24/2022   Procedure: DENTAL RESTORATION/EXTRACTIONS;  Surgeon: Sheryle,  Glendia, DMD;  Location: [facility];  Service: Oral Surgery;  Laterality: N/A;   TOTAL HIP ARTHROPLASTY Left 11/17/2022   Procedure: TOTAL HIP ARTHROPLASTY ANTERIOR APPROACH;  Surgeon: Beverley Evalene BIRCH, MD;  Location: [facility];  Service: Orthopedics;  Laterality: Left;   TOTAL SHOULDER ARTHROPLASTY Left 11/04/2023   Procedure: ARTHROPLASTY, SHOULDER, TOTAL;  Surgeon: Josefina Chew, MD;  Location: [facility];  Service: Orthopedics;  Laterality: Left;   TUBAL  LIGATION     UPPER ESOPHAGEAL ENDOSCOPIC ULTRASOUND (EUS) N/A 06/11/2019   Procedure: UPPER ESOPHAGEAL ENDOSCOPIC ULTRASOUND (EUS);  Surgeon: Rollin Dover, MD;  Location: [facility];  Service: Endoscopy;  Laterality: N/A;   Patient Active Problem List   Diagnosis Date Noted   S/P reverse total shoulder arthroplasty, left 11/04/2023   Vertigo 05/28/2023   Palpitations 12/05/2022   Transaminitis 11/16/2022   Hip fracture (HCC) 11/15/2022   GERD (gastroesophageal reflux disease) 11/15/2022   Tobacco use 11/15/2022   Idiopathic hypotension 07/19/2022   Slurred speech 07/19/2022   Underweight 07/19/2022   Hypotension 07/19/2022   Seizure disorder (HCC) 07/18/2022   Chronic migraine w/o aura w/o status migrainosus, not intractable 07/18/2022   Cerebral cavernoma 07/18/2022   Acute GI bleed due to bleeding pyloric/gastric ulcer 05/15/2019   PUD (peptic ulcer disease)-EGD 05/15/2019 with bleeding pyloric/gastric ulcer, nonbleeding duodenal ulcers and gastritis    Hematemesis with nausea    Migraine headache 09/25/2015   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   Bilateral occipital neuralgia 01/02/2015   Migraine 01/02/2015   Chest pain of uncertain etiology 11/29/2014   DDD (degenerative disc disease), cervical 10/27/2014   DDD (degenerative disc disease), 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   DDD (degenerative disc disease), lumbosacral 10/27/2014   Hypokalemia 05/07/2013   Partial tear subscapularis tendon 12/27/2011    PCP: Dr. Jerilynn Carnes REFERRING PROVIDER: Dr. Chew Josefina  ONSET DATE: 11/04/23  REFERRING DIAG: s/p left reverse TSA  THERAPY DIAG:  Acute pain of left shoulder  Stiffness of left shoulder, not elsewhere classified  Other symptoms and signs involving the musculoskeletal system  Rationale for Evaluation and Treatment: Rehabilitation  SUBJECTIVE:   SUBJECTIVE STATEMENT: S: It's feeling alright   PRECAUTIONS: Shoulder-see protocol  WEIGHT BEARING  RESTRICTIONS: Yes NWB  PAIN:  Are you having pain? Yes: NPRS scale: 1/10 Pain location: elbow and shoulder Pain description: sore Aggravating factors: falling on it Relieving factors: rest   FALLS: Has patient fallen in last 6 months? No  PLOF: Independent  PATIENT GOALS: To have less pain and more mobility.  NEXT MD VISIT: Sept 5th 2025  OBJECTIVE:   HAND DOMINANCE: Right  ADLs: Overall ADLs: Pt is unable to use the LUE for any ADLs at this time. Pt reports difficulty with sleeping, waking up a lot, sleeping in the recliner, tried the bed last night with a little better sleep. Pt is unable to reach overhead or behind back, cannot lift items.   FUNCTIONAL OUTCOME MEASURES: Upper Extremity Functional Scale (UEFS): 8/80=10% 01/08/24: 29/80=36.6%   UPPER EXTREMITY ROM:       Assessed in supine, er/IR adducted  Passive ROM Left eval Left 12/11/23 Left 01/08/24  Shoulder flexion 61 129 147  Shoulder abduction 62 131 144  Shoulder internal rotation 90 90 90  Shoulder external rotation 0 50 80  (Blank rows = not tested)    Assessed in seated, er/IR adducted  Active ROM Left eval Left 01/08/24  Shoulder flexion  131  Shoulder abduction  144  Shoulder internal rotation  90  Shoulder external rotation  56  (Blank rows = not tested)  UPPER EXTREMITY MMT:     Not assessed due to protocol & pain  MMT Left eval Left 01/08/24  Shoulder flexion  4+/5  Shoulder abduction  4/5  Shoulder internal rotation  4-/5  Shoulder external rotation  4+/5  (Blank rows = not tested)   SENSATION: Pt reports intermittent numbness in the elbow  OBSERVATIONS: pt with mod fascial restrictions along upper arm, anterior shoulder, trapezius and scapular regions   TODAY'S TREATMENT:                                                                                                                              DATE:   02/03/24 -Manual techniques: to upper arm, pectoralis region, and  trapezius; mod fascial restrictions- manual completed to reduce fascial restrictions, decrease pain, and improve ROM -A/ROM: seated, flexion, abduction, protraction, horizontal abduction, er/IR, x15 -X to V arms x15 -Goal Post arms x15 -Shoulder Theraband: red band, flexion, abduction, er, IR, horizontal abduction, x10 -Scapular Theraband: green-row, extension, retraction, 15 reps -UBE: level 1, 2.5' forwards and backwards  01/29/24 -Manual techniques: to upper arm, pectoralis region, and trapezius; mod fascial restrictions- manual completed to reduce fascial restrictions, decrease pain, and improve ROM -AA/ROM: supine, flexion, abduction, protraction, horizontal abduction, er/IR, x15 -A/ROM: seated, flexion, abduction, protraction, horizontal abduction, er/IR, x15 -Proximal shoulder strengthening: supine-paddles, criss cross, circles each direction, 12 reps -Strengthening: seated, 1#, flexion, abduction, protraction, horizontal abduction, er/IR, x10 -PNF strengthening: chest pulls, overhead pulls, er pulls, PNF up, PNF down. x10  01/26/24 -Manual techniques: to upper arm, pectoralis region, and trapezius; mod fascial restrictions- manual completed to reduce fascial restrictions, decrease pain, and improve ROM -AA/ROM: supine, flexion, abduction, protraction, horizontal abduction, er/IR, x15 -A/ROM: seated, flexion, abduction, protraction, horizontal abduction, er/IR, x15 -Shoulder Theraband: red band, flexion, abduction, er, IR, horizontal abduction, x10 -Scapular Theraband: green-row, extension, retraction, 15 reps -ABC's on the wall x2: green ball   PATIENT EDUCATION: Education details: Continue HEP Person educated: Patient Education method: Programmer, multimedia, Demonstration, and Handouts Education comprehension: verbalized understanding and returned demonstration  HOME EXERCISE PROGRAM: Eval: pendulums, table slides (no abduction) wrist A/ROM, elbow self-ROM 12/04/23: pulleys 3x/day, for  1-2' each 12/11/23: AA/ROM 01/01/24: A/ROM 01/15/24: red scapular theraband 8/4: Shoulder Theraband 8/7: PNF strengthening  GOALS: Goals reviewed with patient? Yes   SHORT TERM GOALS: Target date: 12/12/23  Pt will be provided with and educated on HEP to improve mobility in LUE required for use during ADL completion.   Goal status: IN PROGRESS  2.  Pt will increase LUE P/ROM by 50+ degrees to improve ability to use LUE during dressing tasks with minimal compensatory techniques.   Goal status: MET  3.  Pt will increase LUE strength to 3+/5 to improve ability to reach for items at waist to chest height during bathing and grooming tasks.   Goal status: IN PROGRESS   LONG TERM GOALS: Target  date: 01/11/24  Pt will decrease pain in LUE to 3/10 or less to improve ability to sleep for 2+ consecutive hours without waking due to pain.   Goal status: IN PROGRESS  2.  Pt will decrease LUE fascial restrictions to min amounts or less to improve mobility required for functional reaching tasks.   Goal status: IN PROGRESS  3.  Pt will increase LUE A/ROM by 75+ degrees to improve ability to use LUE when reaching overhead or behind back during dressing and bathing tasks.   Goal status: IN PROGRESS  4.  Pt will increase LUE strength to 4+/5 or greater to improve ability to use LUE when lifting or carrying items during meal preparation/housework/yardwork tasks.   Goal status: IN PROGRESS  5.  Pt will return to highest level of function using LUE as non-dominant during functional task completion.   Goal status: IN PROGRESS   ASSESSMENT:  CLINICAL IMPRESSION: This session pt continues to report mild pain and weakness. She continued to work on functional strengthening and ROM, where she appears near her baseline. Ended session with the UBE for endurance and reducing tension in her shoulder. OT providing verbal and tactile cuing for positioning and technique throughout session.      PERFORMANCE DEFICITS: in functional skills including in functional skills including ADLs, IADLs, coordination, tone, ROM, strength, pain, fascial restrictions, muscle spasms, and UE functional use     PLAN:  OT FREQUENCY: 2x/week  OT DURATION: 8 weeks  PLANNED INTERVENTIONS: 97168 OT Re-evaluation, 97535 self care/ADL training, 02889 therapeutic exercise, 97530 therapeutic activity, 97112 neuromuscular re-education, 97140 manual therapy, 97014 electrical stimulation unattended, patient/family education, and DME and/or AE instructions  CONSULTED AND AGREED WITH PLAN OF CARE: Patient  PLAN FOR NEXT SESSION: manual techniques prn, A/ROM, shoulder stretches, functional reaching, light strengthening ok, less than 3#    Valentin Nightingale, OTR/L [phone] 02/04/2024, 4:29 PM

## 2024-02-04 ENCOUNTER — Other Ambulatory Visit: Payer: Self-pay | Admitting: Medical Genetics

## 2024-02-05 ENCOUNTER — Ambulatory Visit (HOSPITAL_COMMUNITY): Admitting: Occupational Therapy

## 2024-02-05 ENCOUNTER — Encounter (HOSPITAL_COMMUNITY): Payer: Self-pay | Admitting: Occupational Therapy

## 2024-02-05 DIAGNOSIS — M25512 Pain in left shoulder: Secondary | ICD-10-CM

## 2024-02-05 DIAGNOSIS — R29898 Other symptoms and signs involving the musculoskeletal system: Secondary | ICD-10-CM

## 2024-02-05 DIAGNOSIS — M25612 Stiffness of left shoulder, not elsewhere classified: Secondary | ICD-10-CM | POA: Diagnosis not present

## 2024-02-05 NOTE — Therapy (Signed)
 OUTPATIENT OCCUPATIONAL THERAPY ORTHO TREATMENT REASSESSMENT AND RECERTIFICATION   [name] MRN: [id number] DOB:01/07/1971, 53 y.o., female Today's Date: 02/05/2024   END OF SESSION:  OT End of Session - 02/05/24 1343     Visit Number 23    Number of Visits 27    Date for OT Re-Evaluation 03/06/24    Authorization Type 1) [employer] Dual Complete Medicare 2) [place] Medicaid    Authorization Time Period no auth required    Progress Note Due on Visit 30    OT Start Time 1348    OT Stop Time 1426    OT Time Calculation (min) 38 min    Activity Tolerance Patient tolerated treatment well    Behavior During Therapy WFL for tasks assessed/performed            Past Medical History:  Diagnosis Date   Asthma    seasonal   Bruising, spontaneous 12/08/2013   Cancer (HCC)    cervical cancer - had hysterectomy   Chronic back pain    Chronic back pain    DDD (degenerative disc disease), lumbar    DDD (degenerative disc disease), lumbar    Dysrhythmia    tachycardia   Fibromyalgia    Headache(784.0)    migraines   History of kidney stones    Ovarian cyst    Partial tear subscapularis tendon 12/27/2011   Scoliosis    Seizures (HCC)    started 9/12-   Past Surgical History:  Procedure Laterality Date   ABDOMINAL HYSTERECTOMY  2012   BACK SURGERY  1989   scoliosis throsic-rods   CESAREAN SECTION     CHOLECYSTECTOMY N/A 11/17/2012   Procedure: LAPAROSCOPIC CHOLECYSTECTOMY WITH INTRAOPERATIVE CHOLANGIOGRAM;  Surgeon: Lynda Leos, MD;  Location: [facility];  Service: General;  Laterality: N/A;   COLONOSCOPY WITH PROPOFOL  N/A 04/21/2020   Procedure: COLONOSCOPY WITH PROPOFOL ;  Surgeon: Rollin Dover, MD;  Location: [facility];  Service: Endoscopy;  Laterality: N/A;   ENDOSCOPIC RETROGRADE CHOLANGIOPANCREATOGRAPHY (ERCP) WITH PROPOFOL  N/A 06/11/2019   Procedure: ENDOSCOPIC RETROGRADE CHOLANGIOPANCREATOGRAPHY (ERCP) WITH PROPOFOL ;  Surgeon: Rollin Dover, MD;  Location: [facility];  Service: Endoscopy;  Laterality: N/A;   ERCP N/A 05/07/2013   Procedure: ENDOSCOPIC RETROGRADE CHOLANGIOPANCREATOGRAPHY (ERCP);  Surgeon: Dover JONETTA Rollin, MD;  Location: [facility];  Service: Endoscopy;  Laterality: N/A;  start ercp first, if canulation fails switch to EUS    ESOPHAGOGASTRODUODENOSCOPY (EGD) WITH PROPOFOL  N/A 05/15/2019   Procedure: ESOPHAGOGASTRODUODENOSCOPY (EGD) WITH PROPOFOL ;  Surgeon: Harvey Margo CROME, MD;  Location: [facility];  Service: Endoscopy;  Laterality: N/A;   ESOPHAGOGASTRODUODENOSCOPY (EGD) WITH PROPOFOL  N/A 03/03/2020   Procedure: ESOPHAGOGASTRODUODENOSCOPY (EGD) WITH PROPOFOL ;  Surgeon: Rollin Dover, MD;  Location: [facility];  Service: Endoscopy;  Laterality: N/A;   ESOPHAGOGASTRODUODENOSCOPY (EGD) WITH PROPOFOL  N/A 04/21/2020   Procedure: ESOPHAGOGASTRODUODENOSCOPY (EGD) WITH PROPOFOL ;  Surgeon: Rollin Dover, MD;  Location: [facility];  Service: Endoscopy;  Laterality: N/A;   EUS N/A 11/03/2012   Procedure: UPPER ENDOSCOPIC ULTRASOUND (EUS) LINEAR;  Surgeon: Dover JONETTA Rollin, MD;  Location: [facility];  Service: Endoscopy;  Laterality: N/A;   left arm     NECK SURGERY     POLYPECTOMY  04/21/2020   Procedure: POLYPECTOMY;  Surgeon: Rollin Dover, MD;  Location: [facility];  Service: Endoscopy;;   REMOVAL OF STONES  06/11/2019   Procedure: REMOVAL OF STONES;  Surgeon: Rollin Dover, MD;  Location: [facility];  Service: Endoscopy;;   TOOTH EXTRACTION N/A 05/24/2022   Procedure: DENTAL  RESTORATION/EXTRACTIONS;  Surgeon: Sheryle Hamilton, DMD;  Location: [facility] OR;  Service: Oral Surgery;  Laterality: N/A;   TOTAL HIP ARTHROPLASTY Left 11/17/2022   Procedure: TOTAL HIP ARTHROPLASTY ANTERIOR APPROACH;  Surgeon: Beverley Evalene BIRCH, MD;  Location: [facility];  Service: Orthopedics;  Laterality: Left;   TOTAL SHOULDER ARTHROPLASTY Left 11/04/2023   Procedure: ARTHROPLASTY, SHOULDER, TOTAL;  Surgeon: Josefina Chew, MD;  Location: [facility];  Service: Orthopedics;   Laterality: Left;   TUBAL LIGATION     UPPER ESOPHAGEAL ENDOSCOPIC ULTRASOUND (EUS) N/A 06/11/2019   Procedure: UPPER ESOPHAGEAL ENDOSCOPIC ULTRASOUND (EUS);  Surgeon: Rollin Dover, MD;  Location: [facility];  Service: Endoscopy;  Laterality: N/A;   Patient Active Problem List   Diagnosis Date Noted   S/P reverse total shoulder arthroplasty, left 11/04/2023   Vertigo 05/28/2023   Palpitations 12/05/2022   Transaminitis 11/16/2022   Hip fracture (HCC) 11/15/2022   GERD (gastroesophageal reflux disease) 11/15/2022   Tobacco use 11/15/2022   Idiopathic hypotension 07/19/2022   Slurred speech 07/19/2022   Underweight 07/19/2022   Hypotension 07/19/2022   Seizure disorder (HCC) 07/18/2022   Chronic migraine w/o aura w/o status migrainosus, not intractable 07/18/2022   Cerebral cavernoma 07/18/2022   Acute GI bleed due to bleeding pyloric/gastric ulcer 05/15/2019   PUD (peptic ulcer disease)-EGD 05/15/2019 with bleeding pyloric/gastric ulcer, nonbleeding duodenal ulcers and gastritis    Hematemesis with nausea    Migraine headache 09/25/2015   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   Bilateral occipital neuralgia 01/02/2015   Migraine 01/02/2015   Chest pain of uncertain etiology 11/29/2014   DDD (degenerative disc disease), cervical 10/27/2014   DDD (degenerative disc disease), 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   DDD (degenerative disc disease), lumbosacral 10/27/2014   Hypokalemia 05/07/2013   Partial tear subscapularis tendon 12/27/2011    PCP: Dr. Jerilynn Carnes REFERRING PROVIDER: Dr. Chew Josefina  ONSET DATE: 11/04/23  REFERRING DIAG: s/p left reverse TSA  THERAPY DIAG:  Acute pain of left shoulder  Stiffness of left shoulder, not elsewhere classified  Other symptoms and signs involving the musculoskeletal system  Rationale for Evaluation and Treatment: Rehabilitation  SUBJECTIVE:   SUBJECTIVE STATEMENT: S: It's sore and stiff today.  PRECAUTIONS:  Shoulder-see protocol  WEIGHT BEARING RESTRICTIONS: Yes NWB  PAIN:  Are you having pain? Yes: NPRS scale: 5/10 Pain location: elbow and shoulder Pain description: sore Aggravating factors: weather, stress Relieving factors: rest, heat   FALLS: Has patient fallen in last 6 months? No  PLOF: Independent  PATIENT GOALS: To have less pain and more mobility.  NEXT MD VISIT: Sept 5th 2025  OBJECTIVE:   HAND DOMINANCE: Right  ADLs: Overall ADLs: Pt is unable to use the LUE for any ADLs at this time. Pt reports difficulty with sleeping, waking up a lot, sleeping in the recliner, tried the bed last night with a little better sleep. Pt is unable to reach overhead or behind back, cannot lift items.   FUNCTIONAL OUTCOME MEASURES: Upper Extremity Functional Scale (UEFS): 8/80=10% 01/08/24: 29/80=36.6% 02/05/24: 56/80=70%   UPPER EXTREMITY ROM:       Assessed in supine, er/IR adducted  Passive ROM Left eval Left 12/11/23 Left 01/08/24 Left 02/05/24  Shoulder flexion 61 129 147 155  Shoulder abduction 62 131 144 180  Shoulder internal rotation 90 90 90 90  Shoulder external rotation 0 50 80 65  (Blank rows = not tested)    Assessed in seated, er/IR adducted  Active ROM Left eval Left 01/08/24 Left 02/05/24  Shoulder  flexion  131 132  Shoulder abduction  144 145  Shoulder internal rotation  90 90  Shoulder external rotation  56 51  (Blank rows = not tested)  UPPER EXTREMITY MMT:     Not assessed due to protocol & pain  MMT Left eval Left 01/08/24 Left 02/05/24  Shoulder flexion  4+/5 4+/5  Shoulder abduction  4/5 4/5  Shoulder internal rotation  4-/5 4-/5  Shoulder external rotation  4+/5 4+/5  (Blank rows = not tested)   SENSATION: Pt reports intermittent numbness in the elbow  OBSERVATIONS: pt with mod fascial restrictions along upper arm, anterior shoulder, trapezius and scapular regions   TODAY'S TREATMENT:                                                                                                                               DATE:   02/05/24 -Manual techniques: to upper arm, pectoralis region, and trapezius; mod fascial restrictions- manual completed to reduce fascial restrictions, decrease pain, and improve ROM -Strengthening: sitting, 1#-protraction, flexion, abduction, er/IR, horizontal abduction, 10 reps -Proximal shoulder strengthening: sitting, 1#-paddles, criss cross, circles each direction, 10 reps -Proximal shoulder strengthening on doorway: 1' flexion -Scapular Theraband: red-row, extension, retraction, 10 reps -Overhead lacing: seated, 1# wrist weight-lacing from top down then reversing -Functional reaching: pt wearing 1# wrist weight, placing and removing 4 cones from middle shelf of overhead cabinet in flexion, repeating in abduction, completing 2 rounds each -UBE: level 2, 3' reverse, pace: 7.0  02/03/24 -Manual techniques: to upper arm, pectoralis region, and trapezius; mod fascial restrictions- manual completed to reduce fascial restrictions, decrease pain, and improve ROM -A/ROM: seated, flexion, abduction, protraction, horizontal abduction, er/IR, x15 -X to V arms x15 -Goal Post arms x15 -Shoulder Theraband: red band, flexion, abduction, er, IR, horizontal abduction, x10 -Scapular Theraband: green-row, extension, retraction, 15 reps -UBE: level 1, 2.5' forwards and backwards  01/29/24 -Manual techniques: to upper arm, pectoralis region, and trapezius; mod fascial restrictions- manual completed to reduce fascial restrictions, decrease pain, and improve ROM -AA/ROM: supine, flexion, abduction, protraction, horizontal abduction, er/IR, x15 -A/ROM: seated, flexion, abduction, protraction, horizontal abduction, er/IR, x15 -Proximal shoulder strengthening: supine-paddles, criss cross, circles each direction, 12 reps -Strengthening: seated, 1#, flexion, abduction, protraction, horizontal abduction, er/IR, x10 -PNF strengthening:  chest pulls, overhead pulls, er pulls, PNF up, PNF down. x10   PATIENT EDUCATION: Education details: Continue HEP Person educated: Patient Education method: Programmer, multimedia, Demonstration, and Handouts Education comprehension: verbalized understanding and returned demonstration  HOME EXERCISE PROGRAM: Eval: pendulums, table slides (no abduction) wrist A/ROM, elbow self-ROM 12/04/23: pulleys 3x/day, for 1-2' each 12/11/23: AA/ROM 01/01/24: A/ROM 01/15/24: red scapular theraband 8/4: Shoulder Theraband 8/7: PNF strengthening  GOALS: Goals reviewed with patient? Yes   SHORT TERM GOALS: Target date: 12/12/23  Pt will be provided with and educated on HEP to improve mobility in LUE required for use during ADL completion.   Goal status: MET  2.  Pt will increase  LUE P/ROM by 50+ degrees to improve ability to use LUE during dressing tasks with minimal compensatory techniques.   Goal status: MET  3.  Pt will increase LUE strength to 3+/5 to improve ability to reach for items at waist to chest height during bathing and grooming tasks.   Goal status: MET   LONG TERM GOALS: Target date: 01/11/24  Pt will decrease pain in LUE to 3/10 or less to improve ability to sleep for 2+ consecutive hours without waking due to pain.   Goal status: IN PROGRESS  2.  Pt will decrease LUE fascial restrictions to min amounts or less to improve mobility required for functional reaching tasks.   Goal status: MET  3.  Pt will increase LUE A/ROM by 75+ degrees to improve ability to use LUE when reaching overhead or behind back during dressing and bathing tasks.   Goal status: MET  4.  Pt will increase LUE strength to 4+/5 or greater to improve ability to use LUE when lifting or carrying items during meal preparation/housework/yardwork tasks.   Goal status: IN PROGRESS  5.  Pt will return to highest level of function using LUE as non-dominant during functional task completion.   Goal status: IN  PROGRESS   ASSESSMENT:  CLINICAL IMPRESSION: Reassessment completed this session, pt is showing progress with ROM, strength, and functional use of the LUE. Pt has met all STGs and 2/5 LTGs with remaining goals in progress. Discussed reassessment findings with pt and recommended continued therapy services at reduced frequency of 1x/week to focus on strengthening and stability required for successful functional use of the LUE during ADLs and household tasks. Pt agreeable to plan. Progressed to light strengthening today, continued with stability work and postural training to promote functional reaching during ADLs. Pt with intermittent trapezius hiking, cuing to depress. Verbal cuing for form and technique, rest breaks provided as needed.    PERFORMANCE DEFICITS: in functional skills including in functional skills including ADLs, IADLs, coordination, tone, ROM, strength, pain, fascial restrictions, muscle spasms, and UE functional use     PLAN:  OT FREQUENCY: 1x/week  OT DURATION: 4 weeks  PLANNED INTERVENTIONS: 97168 OT Re-evaluation, 97535 self care/ADL training, 02889 therapeutic exercise, 97530 therapeutic activity, 97112 neuromuscular re-education, 97140 manual therapy, 97014 electrical stimulation unattended, patient/family education, and DME and/or AE instructions  CONSULTED AND AGREED WITH PLAN OF CARE: Patient  PLAN FOR NEXT SESSION: manual techniques prn, A/ROM, shoulder stretches, functional reaching, light strengthening ok, less than 3#    Sonny Cory, OTR/L  [phone] 02/05/2024, 2:27 PM

## 2024-02-10 ENCOUNTER — Ambulatory Visit: Admitting: Neurology

## 2024-02-11 ENCOUNTER — Other Ambulatory Visit (HOSPITAL_COMMUNITY): Payer: Self-pay | Admitting: Internal Medicine

## 2024-02-11 DIAGNOSIS — R928 Other abnormal and inconclusive findings on diagnostic imaging of breast: Secondary | ICD-10-CM

## 2024-02-13 ENCOUNTER — Encounter (HOSPITAL_COMMUNITY): Admitting: Occupational Therapy

## 2024-02-16 ENCOUNTER — Inpatient Hospital Stay
Admission: RE | Admit: 2024-02-16 | Discharge: 2024-02-16 | Disposition: A | Discharge status: Home / Self Care | Payer: Self-pay | Source: Ambulatory Visit | Attending: Internal Medicine | Admitting: Internal Medicine

## 2024-02-16 ENCOUNTER — Other Ambulatory Visit (HOSPITAL_COMMUNITY): Payer: Self-pay | Admitting: Internal Medicine

## 2024-02-16 DIAGNOSIS — R928 Other abnormal and inconclusive findings on diagnostic imaging of breast: Secondary | ICD-10-CM

## 2024-02-17 ENCOUNTER — Ambulatory Visit (HOSPITAL_COMMUNITY)
Admission: RE | Admit: 2024-02-17 | Discharge: 2024-02-17 | Disposition: A | Discharge status: Home / Self Care | Source: Ambulatory Visit | Attending: Internal Medicine | Admitting: Internal Medicine

## 2024-02-17 ENCOUNTER — Other Ambulatory Visit (HOSPITAL_COMMUNITY): Payer: Self-pay | Admitting: Internal Medicine

## 2024-02-17 DIAGNOSIS — R921 Mammographic calcification found on diagnostic imaging of breast: Secondary | ICD-10-CM | POA: Diagnosis not present

## 2024-02-17 DIAGNOSIS — R928 Other abnormal and inconclusive findings on diagnostic imaging of breast: Secondary | ICD-10-CM

## 2024-02-17 DIAGNOSIS — R92333 Mammographic heterogeneous density, bilateral breasts: Secondary | ICD-10-CM | POA: Diagnosis not present

## 2024-02-20 ENCOUNTER — Encounter (HOSPITAL_COMMUNITY): Payer: Self-pay | Admitting: Occupational Therapy

## 2024-02-20 ENCOUNTER — Ambulatory Visit (HOSPITAL_COMMUNITY): Admitting: Occupational Therapy

## 2024-02-20 DIAGNOSIS — M25612 Stiffness of left shoulder, not elsewhere classified: Secondary | ICD-10-CM | POA: Diagnosis not present

## 2024-02-20 DIAGNOSIS — M25512 Pain in left shoulder: Secondary | ICD-10-CM

## 2024-02-20 DIAGNOSIS — R29898 Other symptoms and signs involving the musculoskeletal system: Secondary | ICD-10-CM

## 2024-02-20 NOTE — Patient Instructions (Signed)

## 2024-02-20 NOTE — Therapy (Signed)
 OUTPATIENT OCCUPATIONAL THERAPY ORTHO TREATMENT DISCHARGE SUMMARY    [name] MRN: [id number] DOB:[birth year], 53 y.o., female Today's Date: 02/20/2024  OCCUPATIONAL THERAPY DISCHARGE SUMMARY  Visits from Start of Care: 24  Current functional level related to goals / functional outcomes: See below. Pt has met all goals and partially met one remaining goal. Pt is using the LUE during ADLs, pain is minimal.    Remaining deficits: Mild strength deficits   Education / Equipment: HEP for strengthening   Patient agrees to discharge. Patient goals were met. Patient is being discharged due to meeting the stated rehab goals..     END OF SESSION:  OT End of Session - 02/20/24 1321     Visit Number 24    Number of Visits 27    Date for OT Re-Evaluation 03/06/24    Authorization Type 1) [employer] Dual Complete Medicare 2) [place] Medicaid    Authorization Time Period no auth required    Progress Note Due on Visit 30    OT Start Time 1302    OT Stop Time 1327    OT Time Calculation (min) 25 min    Activity Tolerance Patient tolerated treatment well    Behavior During Therapy WFL for tasks assessed/performed             Past Medical History:  Diagnosis Date   Asthma    seasonal   Bruising, spontaneous 12/08/2013   Cancer (HCC)    cervical cancer - had hysterectomy   Chronic back pain    Chronic back pain    DDD (degenerative disc disease), lumbar    DDD (degenerative disc disease), lumbar    Dysrhythmia    tachycardia   Fibromyalgia    Headache(784.0)    migraines   History of kidney stones    Ovarian cyst    Partial tear subscapularis tendon 12/27/2011   Scoliosis    Seizures (HCC)    started 9/12-   Past Surgical History:  Procedure Laterality Date   ABDOMINAL HYSTERECTOMY  2012   BACK SURGERY  1989   scoliosis throsic-rods   CESAREAN SECTION     CHOLECYSTECTOMY N/A 11/17/2012   Procedure: LAPAROSCOPIC CHOLECYSTECTOMY WITH INTRAOPERATIVE  CHOLANGIOGRAM;  Surgeon: Lynda Leos, MD;  [facility];  Service: General;  Laterality: N/A;   COLONOSCOPY WITH PROPOFOL  N/A 04/21/2020   Procedure: COLONOSCOPY WITH PROPOFOL ;  Surgeon: Rollin Dover, MD;  [facility];  Service: Endoscopy;  Laterality: N/A;   ENDOSCOPIC RETROGRADE CHOLANGIOPANCREATOGRAPHY (ERCP) WITH PROPOFOL  N/A 06/11/2019   Procedure: ENDOSCOPIC RETROGRADE CHOLANGIOPANCREATOGRAPHY (ERCP) WITH PROPOFOL ;  Surgeon: Rollin Dover, MD;  [facility];  Service: Endoscopy;  Laterality: N/A;   ERCP N/A 05/07/2013   Procedure: ENDOSCOPIC RETROGRADE CHOLANGIOPANCREATOGRAPHY (ERCP);  Surgeon: Dover JONETTA Rollin, MD;  Location: [facility];  Service: Endoscopy;  Laterality: N/A;  start ercp first, if canulation fails switch to EUS    ESOPHAGOGASTRODUODENOSCOPY (EGD) WITH PROPOFOL  N/A 05/15/2019   Procedure: ESOPHAGOGASTRODUODENOSCOPY (EGD) WITH PROPOFOL ;  Surgeon: Harvey Margo CROME, MD;  [facility];  Service: Endoscopy;  Laterality: N/A;   ESOPHAGOGASTRODUODENOSCOPY (EGD) WITH PROPOFOL  N/A 03/03/2020   Procedure: ESOPHAGOGASTRODUODENOSCOPY (EGD) WITH PROPOFOL ;  Surgeon: Rollin Dover, MD;  [facility];  Service: Endoscopy;  Laterality: N/A;   ESOPHAGOGASTRODUODENOSCOPY (EGD) WITH PROPOFOL  N/A 04/21/2020   Procedure: ESOPHAGOGASTRODUODENOSCOPY (EGD) WITH PROPOFOL ;  Surgeon: Rollin Dover, MD;  [facility];  Service: Endoscopy;  Laterality: N/A;   EUS N/A 11/03/2012   Procedure: UPPER ENDOSCOPIC ULTRASOUND (EUS)  LINEAR;  Surgeon: Belvie JONETTA Just, MD;  Location: [facility];  Service: Endoscopy;  Laterality: N/A;   left arm     NECK SURGERY     POLYPECTOMY  04/21/2020   Procedure: POLYPECTOMY;  Surgeon: Just Belvie, MD;  [facility];  Service: Endoscopy;;   REMOVAL OF STONES  06/11/2019   Procedure: REMOVAL OF STONES;  Surgeon: Just Belvie, MD;  [facility];  Service: Endoscopy;;   TOOTH EXTRACTION N/A  05/24/2022   Procedure: DENTAL RESTORATION/EXTRACTIONS;  Surgeon: Sheryle Hamilton, DMD;  [facility];  Service: Oral Surgery;  Laterality: N/A;   TOTAL HIP ARTHROPLASTY Left 11/17/2022   Procedure: TOTAL HIP ARTHROPLASTY ANTERIOR APPROACH;  Surgeon: Beverley Evalene JONETTA, MD;  [facility];  Service: Orthopedics;  Laterality: Left;   TOTAL SHOULDER ARTHROPLASTY Left 11/04/2023   Procedure: ARTHROPLASTY, SHOULDER, TOTAL;  Surgeon: Josefina Chew, MD;  [facility];  Service: Orthopedics;  Laterality: Left;   TUBAL LIGATION     UPPER ESOPHAGEAL ENDOSCOPIC ULTRASOUND (EUS) N/A 06/11/2019   Procedure: UPPER ESOPHAGEAL ENDOSCOPIC ULTRASOUND (EUS);  Surgeon: Just Belvie, MD;  Location: [facility];  Service: Endoscopy;  Laterality: N/A;   Patient Active Problem List   Diagnosis Date Noted   S/P reverse total shoulder arthroplasty, left 11/04/2023   Vertigo 05/28/2023   Palpitations 12/05/2022   Transaminitis 11/16/2022   Hip fracture (HCC) 11/15/2022   GERD (gastroesophageal reflux disease) 11/15/2022   Tobacco use 11/15/2022   Idiopathic hypotension 07/19/2022   Slurred speech 07/19/2022   Underweight 07/19/2022   Hypotension 07/19/2022   Seizure disorder (HCC) 07/18/2022   Chronic migraine w/o aura w/o status migrainosus, not intractable 07/18/2022   Cerebral cavernoma 07/18/2022   Acute GI bleed due to bleeding pyloric/gastric ulcer 05/15/2019   PUD (peptic ulcer disease)-EGD 05/15/2019 with bleeding pyloric/gastric ulcer, nonbleeding duodenal ulcers and gastritis    Hematemesis with nausea    Migraine headache 09/25/2015   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   Bilateral occipital neuralgia 01/02/2015   Migraine 01/02/2015   Chest pain of uncertain etiology 11/29/2014   DDD (degenerative disc disease), cervical 10/27/2014   DDD (degenerative disc disease), 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   DDD (degenerative disc disease), lumbosacral 10/27/2014   Hypokalemia 05/07/2013    Partial tear subscapularis tendon 12/27/2011    PCP: Dr. Jerilynn Carnes REFERRING PROVIDER: Dr. Chew Josefina  ONSET DATE: 11/04/23  REFERRING DIAG: s/p left reverse TSA  THERAPY DIAG:  Acute pain of left shoulder  Stiffness of left shoulder, not elsewhere classified  Other symptoms and signs involving the musculoskeletal system  Rationale for Evaluation and Treatment: Rehabilitation  SUBJECTIVE:   SUBJECTIVE STATEMENT: S: I'm doing ok with my arm  PRECAUTIONS: Shoulder-see protocol  WEIGHT BEARING RESTRICTIONS: Yes NWB  PAIN:  Are you having pain? No   FALLS: Has patient fallen in last 6 months? No  PLOF: Independent  PATIENT GOALS: To have less pain and more mobility.  NEXT MD VISIT: Sept 5th 2025  OBJECTIVE:   HAND DOMINANCE: Right  ADLs: Overall ADLs: Pt is unable to use the LUE for any ADLs at this time. Pt reports difficulty with sleeping, waking up a lot, sleeping in the recliner, tried the bed last night with a little better sleep. Pt is unable to reach overhead or behind back, cannot lift items.   FUNCTIONAL OUTCOME MEASURES: Upper Extremity Functional Scale (UEFS): 8/80=10% 01/08/24: 29/80=36.6% 02/05/24: 56/80=70%   UPPER EXTREMITY ROM:       Assessed in supine, er/IR  adducted  Passive ROM Left eval Left 12/11/23 Left 01/08/24 Left 02/05/24  Shoulder flexion 61 129 147 155  Shoulder abduction 62 131 144 180  Shoulder internal rotation 90 90 90 90  Shoulder external rotation 0 50 80 65  (Blank rows = not tested)    Assessed in seated, er/IR adducted  Active ROM Left eval Left 01/08/24 Left 02/05/24 Left 02/20/24  Shoulder flexion  131 132 132  Shoulder abduction  144 145 170  Shoulder internal rotation  90 90 90  Shoulder external rotation  56 51 50  (Blank rows = not tested)  UPPER EXTREMITY MMT:     Not assessed at eval due to protocol & pain Assessed in sitting, er/IR adducted  MMT Left eval Left 01/08/24 Left 02/05/24  Left 02/20/24  Shoulder flexion  4+/5 4+/5 4+/5  Shoulder abduction  4/5 4/5 4+/5  Shoulder internal rotation  4-/5 4-/5 4/5  Shoulder external rotation  4+/5 4+/5 4+/5  (Blank rows = not tested)   SENSATION: Pt reports intermittent numbness in the elbow  OBSERVATIONS: pt with mod fascial restrictions along upper arm, anterior shoulder, trapezius and scapular regions   TODAY'S TREATMENT:                                                                                                                              DATE:   02/20/24 -Therapy ball strengthening: overhead press, chest press, circles each direction, flexion, PNF patterns, 10 reps -Strengthening: sitting, 1#-protraction, flexion, abduction, er/IR, horizontal abduction, 10 reps  02/05/24 -Manual techniques: to upper arm, pectoralis region, and trapezius; mod fascial restrictions- manual completed to reduce fascial restrictions, decrease pain, and improve ROM -Strengthening: sitting, 1#-protraction, flexion, abduction, er/IR, horizontal abduction, 10 reps -Proximal shoulder strengthening: sitting, 1#-paddles, criss cross, circles each direction, 10 reps -Proximal shoulder strengthening on doorway: 1' flexion -Scapular Theraband: red-row, extension, retraction, 10 reps -Overhead lacing: seated, 1# wrist weight-lacing from top down then reversing -Functional reaching: pt wearing 1# wrist weight, placing and removing 4 cones from middle shelf of overhead cabinet in flexion, repeating in abduction, completing 2 rounds each -UBE: level 2, 3' reverse, pace: 7.0  02/03/24 -Manual techniques: to upper arm, pectoralis region, and trapezius; mod fascial restrictions- manual completed to reduce fascial restrictions, decrease pain, and improve ROM -A/ROM: seated, flexion, abduction, protraction, horizontal abduction, er/IR, x15 -X to V arms x15 -Goal Post arms x15 -Shoulder Theraband: red band, flexion, abduction, er, IR, horizontal  abduction, x10 -Scapular Theraband: green-row, extension, retraction, 15 reps -UBE: level 1, 2.5' forwards and backwards    PATIENT EDUCATION: Education details: therapy ball strengthening Person educated: Patient Education method: Explanation, Demonstration, and Handouts Education comprehension: verbalized understanding and returned demonstration  HOME EXERCISE PROGRAM: Eval: pendulums, table slides (no abduction) wrist A/ROM, elbow self-ROM 12/04/23: pulleys 3x/day, for 1-2' each 12/11/23: AA/ROM 01/01/24: A/ROM 01/15/24: red scapular theraband 8/4: Shoulder Theraband 8/7: PNF strengthening 8/29: therapy ball strengthening   GOALS: Goals reviewed  with patient? Yes   SHORT TERM GOALS: Target date: 12/12/23  Pt will be provided with and educated on HEP to improve mobility in LUE required for use during ADL completion.   Goal status: MET  2.  Pt will increase LUE P/ROM by 50+ degrees to improve ability to use LUE during dressing tasks with minimal compensatory techniques.   Goal status: MET  3.  Pt will increase LUE strength to 3+/5 to improve ability to reach for items at waist to chest height during bathing and grooming tasks.   Goal status: MET   LONG TERM GOALS: Target date: 01/11/24  Pt will decrease pain in LUE to 3/10 or less to improve ability to sleep for 2+ consecutive hours without waking due to pain.   Goal status: MET  2.  Pt will decrease LUE fascial restrictions to min amounts or less to improve mobility required for functional reaching tasks.   Goal status: MET  3.  Pt will increase LUE A/ROM by 75+ degrees to improve ability to use LUE when reaching overhead or behind back during dressing and bathing tasks.   Goal status: MET  4.  Pt will increase LUE strength to 4+/5 or greater to improve ability to use LUE when lifting or carrying items during meal preparation/housework/yardwork tasks.   Goal status: PARTIALLY MET  5.  Pt will return to highest  level of function using LUE as non-dominant during functional task completion.   Goal status: MET   ASSESSMENT:  CLINICAL IMPRESSION: Pt reports she has some new medical issues going on and has some upcoming testing. Pt reports she has not had pain in the shoulder and is doing her HEP, using the LUE during ADLs without difficulty. Does have some pain if she tries to sleep on the left side. Measurements taken today, pt demonstrates improvement in strength since prior session 2 weeks ago. Pt has met all goals, strength goal partially met and continues to improve. Pt agreeable to discharge today with HEP, so she can focus on other medical issues right now.    PERFORMANCE DEFICITS: in functional skills including in functional skills including ADLs, IADLs, coordination, tone, ROM, strength, pain, fascial restrictions, muscle spasms, and UE functional use     PLAN:  OT FREQUENCY: 1x/week  OT DURATION: 4 weeks  PLANNED INTERVENTIONS: 97168 OT Re-evaluation, 97535 self care/ADL training, 02889 therapeutic exercise, 97530 therapeutic activity, 97112 neuromuscular re-education, 97140 manual therapy, 97014 electrical stimulation unattended, patient/family education, and DME and/or AE instructions  CONSULTED AND AGREED WITH PLAN OF CARE: Patient  PLAN FOR NEXT SESSION: discharge today    Sonny Cory, OTR/L  [phone] 02/20/2024, 1:28 PM

## 2024-02-25 ENCOUNTER — Ambulatory Visit
Admission: RE | Admit: 2024-02-25 | Discharge: 2024-02-25 | Disposition: A | Discharge status: Home / Self Care | Source: Ambulatory Visit | Attending: Internal Medicine | Admitting: Internal Medicine

## 2024-02-25 DIAGNOSIS — R921 Mammographic calcification found on diagnostic imaging of breast: Secondary | ICD-10-CM | POA: Diagnosis not present

## 2024-02-25 DIAGNOSIS — D0511 Intraductal carcinoma in situ of right breast: Secondary | ICD-10-CM | POA: Diagnosis not present

## 2024-02-25 HISTORY — PX: BREAST BIOPSY: SHX20

## 2024-02-26 DIAGNOSIS — G894 Chronic pain syndrome: Secondary | ICD-10-CM | POA: Diagnosis not present

## 2024-02-26 DIAGNOSIS — M1991 Primary osteoarthritis, unspecified site: Secondary | ICD-10-CM | POA: Diagnosis not present

## 2024-02-26 DIAGNOSIS — G250 Essential tremor: Secondary | ICD-10-CM | POA: Diagnosis not present

## 2024-02-26 DIAGNOSIS — C50919 Malignant neoplasm of unspecified site of unspecified female breast: Secondary | ICD-10-CM | POA: Diagnosis not present

## 2024-02-26 DIAGNOSIS — G43109 Migraine with aura, not intractable, without status migrainosus: Secondary | ICD-10-CM | POA: Diagnosis not present

## 2024-02-26 LAB — SURGICAL PATHOLOGY

## 2024-02-27 ENCOUNTER — Encounter (HOSPITAL_COMMUNITY): Admitting: Occupational Therapy

## 2024-03-01 DIAGNOSIS — S46012D Strain of muscle(s) and tendon(s) of the rotator cuff of left shoulder, subsequent encounter: Secondary | ICD-10-CM | POA: Diagnosis not present

## 2024-03-05 ENCOUNTER — Encounter (HOSPITAL_COMMUNITY): Admitting: Occupational Therapy

## 2024-03-12 ENCOUNTER — Other Ambulatory Visit: Payer: Self-pay | Admitting: *Deleted

## 2024-03-12 DIAGNOSIS — D0511 Intraductal carcinoma in situ of right breast: Secondary | ICD-10-CM

## 2024-03-16 ENCOUNTER — Ambulatory Visit (INDEPENDENT_AMBULATORY_CARE_PROVIDER_SITE_OTHER): Admitting: General Surgery

## 2024-03-16 ENCOUNTER — Encounter: Payer: Self-pay | Admitting: General Surgery

## 2024-03-16 VITALS — BP 98/60 | HR 89 | Temp 97.8°F | Resp 14 | Ht 60.0 in | Wt 102.0 lb

## 2024-03-16 DIAGNOSIS — D0511 Intraductal carcinoma in situ of right breast: Secondary | ICD-10-CM | POA: Diagnosis not present

## 2024-03-17 NOTE — Progress Notes (Signed)
 Sharon Welch; [id number]; December 09, 1970   HPI Patient is a 53 year old white female who is [facility] and Dr. Bertell for evaluation and treatment of newly diagnosed right DCIS.  This was found on routine mammography.  There are 2 areas in the lower, inner quadrant of the right breast which are positive for DCIS.  Patient denies feeling any masses.  She denies any nipple discharge.  She denies any immediate family history of breast cancer. Past Medical History:  Diagnosis Date   Asthma    seasonal   Bruising, spontaneous 12/08/2013   Cancer (HCC)    cervical cancer - had hysterectomy   Chronic back pain    Chronic back pain    DDD (degenerative disc disease), lumbar    DDD (degenerative disc disease), lumbar    Dysrhythmia    tachycardia   Fibromyalgia    Headache(784.0)    migraines   History of kidney stones    Ovarian cyst    Partial tear subscapularis tendon 12/27/2011   Scoliosis    Seizures (HCC)    started 9/12-    Past Surgical History:  Procedure Laterality Date   ABDOMINAL HYSTERECTOMY  2012   BACK SURGERY  1989   scoliosis throsic-rods   BREAST BIOPSY Right 02/25/2024   MM RT BREAST BX W LOC DEV 1ST LESION IMAGE BX SPEC STEREO GUIDE 02/25/2024 [facility]   BREAST BIOPSY Right 02/25/2024   MM RT BREAST BX W LOC DEV EA AD LESION IMG BX SPEC STEREO GUIDE 02/25/2024 [facility]   CESAREAN SECTION     CHOLECYSTECTOMY N/A 11/17/2012   Procedure: LAPAROSCOPIC CHOLECYSTECTOMY WITH INTRAOPERATIVE CHOLANGIOGRAM;  Surgeon: Lynda Leos, MD;  [facility];  Service: General;  Laterality: N/A;   COLONOSCOPY WITH PROPOFOL  N/A 04/21/2020   Procedure: COLONOSCOPY WITH PROPOFOL ;  Surgeon: Rollin Dover, MD;  [facility];  Service: Endoscopy;  Laterality: N/A;   ENDOSCOPIC RETROGRADE CHOLANGIOPANCREATOGRAPHY (ERCP) WITH PROPOFOL  N/A 06/11/2019   Procedure: ENDOSCOPIC RETROGRADE CHOLANGIOPANCREATOGRAPHY (ERCP) WITH PROPOFOL ;  Surgeon:  Rollin Dover, MD;  [facility];  Service: Endoscopy;  Laterality: N/A;   ERCP N/A 05/07/2013   Procedure: ENDOSCOPIC RETROGRADE CHOLANGIOPANCREATOGRAPHY (ERCP);  Surgeon: Dover JONETTA Rollin, MD;  Location: [facility];  Service: Endoscopy;  Laterality: N/A;  start ercp first, if canulation fails switch to EUS    ESOPHAGOGASTRODUODENOSCOPY (EGD) WITH PROPOFOL  N/A 05/15/2019   Procedure: ESOPHAGOGASTRODUODENOSCOPY (EGD) WITH PROPOFOL ;  Surgeon: Harvey Margo CROME, MD;  [facility];  Service: Endoscopy;  Laterality: N/A;   ESOPHAGOGASTRODUODENOSCOPY (EGD) WITH PROPOFOL  N/A 03/03/2020   Procedure: ESOPHAGOGASTRODUODENOSCOPY (EGD) WITH PROPOFOL ;  Surgeon: Rollin Dover, MD;  [facility];  Service: Endoscopy;  Laterality: N/A;   ESOPHAGOGASTRODUODENOSCOPY (EGD) WITH PROPOFOL  N/A 04/21/2020   Procedure: ESOPHAGOGASTRODUODENOSCOPY (EGD) WITH PROPOFOL ;  Surgeon: Rollin Dover, MD;  [facility];  Service: Endoscopy;  Laterality: N/A;   EUS N/A 11/03/2012   Procedure: UPPER ENDOSCOPIC ULTRASOUND (EUS) LINEAR;  Surgeon: Dover JONETTA Rollin, MD;  [facility];  Service: Endoscopy;  Laterality: N/A;   left arm     NECK SURGERY     POLYPECTOMY  04/21/2020   Procedure: POLYPECTOMY;  Surgeon: Rollin Dover, MD;  [facility];  Service: Endoscopy;;   REMOVAL OF STONES  06/11/2019   Procedure: REMOVAL OF STONES;  Surgeon: Rollin Dover, MD;  [facility];  Service: Endoscopy;;   TOOTH EXTRACTION N/A 05/24/2022   Procedure: DENTAL RESTORATION/EXTRACTIONS;  Surgeon: Sheryle Hamilton, DMD;  [facility];  Service: Oral Surgery;  Laterality: N/A;   TOTAL HIP ARTHROPLASTY Left 11/17/2022   Procedure: TOTAL HIP ARTHROPLASTY ANTERIOR APPROACH;  Surgeon: Beverley Evalene BIRCH, MD;  [facility];  Service: Orthopedics;  Laterality: Left;   TOTAL SHOULDER ARTHROPLASTY Left 11/04/2023   Procedure: ARTHROPLASTY, SHOULDER, TOTAL;  Surgeon: Josefina Chew, MD;   [facility];  Service: Orthopedics;  Laterality: Left;   TUBAL LIGATION     UPPER ESOPHAGEAL ENDOSCOPIC ULTRASOUND (EUS) N/A 06/11/2019   Procedure: UPPER ESOPHAGEAL ENDOSCOPIC ULTRASOUND (EUS);  Surgeon: Rollin Dover, MD;  Location: [facility];  Service: Endoscopy;  Laterality: N/A;    Family History  Problem Relation Age of Onset   Hypertension Mother    Hypertension Father    Cancer Father        prostate   Heart disease Father    Hypertension Brother    Hypertension Brother    Breast cancer Neg Hx     Current Outpatient Medications on File Prior to Visit  Medication Sig Dispense Refill   albuterol  (VENTOLIN  HFA) 108 (90 Base) MCG/ACT inhaler Inhale 1-2 puffs into the lungs every 4 (four) hours as needed for wheezing or shortness of breath.     ALPRAZolam  (XANAX ) 0.25 MG tablet Take 0.25-0.5 mg by mouth 4 (four) times daily as needed for anxiety or sleep.     amitriptyline  (ELAVIL ) 25 MG tablet Take 50 mg by mouth at bedtime.     Compression Bandages KIT 1 each by Does not apply route daily. 1 kit 0   dexlansoprazole (DEXILANT) 60 MG capsule Take 60 mg by mouth daily.     diltiazem  (CARDIZEM  CD) 180 MG 24 hr capsule TAKE ONE CAPSULE BY MOUTH EVERY DAY 30 capsule 5   folic acid  (FOLVITE ) 1 MG tablet Take 1 mg by mouth daily.     furosemide  (LASIX ) 20 MG tablet Take 1 tablet (20 mg total) by mouth daily. 90 tablet 1   HYDROcodone -acetaminophen  (NORCO) 10-325 MG tablet Take 1 tablet by mouth in the morning, at noon, in the evening, and at bedtime.     lamoTRIgine  (LAMICTAL ) 150 MG tablet Take 1 tablet (150 mg total) by mouth 2 (two) times daily. 60 tablet 11   levETIRAcetam  (KEPPRA ) 500 MG tablet Take 1 tablet (500 mg total) by mouth 2 (two) times daily. 60 tablet 5   linaclotide  (LINZESS ) 145 MCG CAPS capsule Take 145 mcg by mouth daily before breakfast.     meclizine  (ANTIVERT ) 25 MG tablet Take 1 tablet (25 mg total) by mouth 3 (three) times daily. 30 tablet 0    methocarbamol  (ROBAXIN ) 500 MG tablet Take 500 mg by mouth 3 (three) times daily.     Milnacipran  (SAVELLA ) 50 MG TABS tablet Take 50 mg by mouth 2 (two) times daily.     promethazine  (PHENERGAN ) 25 MG tablet Take 25 mg by mouth every 6 (six) hours as needed for nausea or vomiting.     QULIPTA  60 MG TABS Take 1 tablet (60 mg total) by mouth daily. 30 tablet 11   rizatriptan (MAXALT) 10 MG tablet Take 10 mg by mouth See admin instructions. 10 mg as needed for migraine, may repeat 10 mg in 2 hours if headache persists or recurs.     No current facility-administered medications on file prior to visit.    Allergies  Allergen Reactions   Ultram [Tramadol] Other (See Comments)    Possible seizures   Aleve [Naproxen] Other (See Comments)  Ulcers   Blueberry [Vaccinium Angustifolium] Swelling   Coconut (Cocos Nucifera) Swelling   Depakote  [Divalproex  Sodium] Other (See Comments)    Insomnia . (Changed from divalproex  to Lamictal  due to side effects)   Nsaids Other (See Comments)    History of bleeding ulcers.   Pineapple Other (See Comments)    Migraine    Social History   Substance and Sexual Activity  Alcohol Use Not Currently    Social History   Tobacco Use  Smoking Status Every Day   Current packs/day: 0.50   Average packs/day: 0.5 packs/day for 20.0 years (10.0 ttl pk-yrs)   Types: Cigarettes  Smokeless Tobacco Never    Review of Systems  Constitutional: Negative.   HENT: Negative.    Eyes: Negative.   Respiratory:  Positive for wheezing.   Cardiovascular: Negative.   Gastrointestinal:  Positive for nausea.  Genitourinary: Negative.   Musculoskeletal:  Positive for back pain, joint pain and neck pain.  Skin: Negative.   Neurological:  Positive for dizziness, sensory change and headaches.  Endo/Heme/Allergies: Negative.   Psychiatric/Behavioral: Negative.      Objective   Vitals:   03/16/24 1019  BP: 98/60  Pulse: 89  Resp: 14  Temp: 97.8 F (36.6 C)   SpO2: 96%    Physical Exam Vitals reviewed.  Constitutional:      Appearance: Normal appearance. She is normal weight. She is not ill-appearing.  HENT:     Head: Normocephalic and atraumatic.  Cardiovascular:     Rate and Rhythm: Normal rate and regular rhythm.     Heart sounds: Normal heart sounds. No murmur heard.    No friction rub. No gallop.  Pulmonary:     Effort: Pulmonary effort is normal. No respiratory distress.     Breath sounds: Normal breath sounds. No stridor. No wheezing, rhonchi or rales.  Musculoskeletal:     Cervical back: Normal range of motion and neck supple.  Lymphadenopathy:     Cervical: No cervical adenopathy.  Skin:    General: Skin is warm and dry.  Neurological:     Mental Status: She is alert and oriented to person, place, and time.   Breast: No dominant mass, nipple discharge, or dimpling noted in either breast.  Axilla is negative for lymphadenopathy.  Mammogram and pathology reports reviewed  Assessment  Newly diagnosed DCIS, right breast.  ER positive, PR negative, HER2 pending Plan  I discussed both surgical options with the patient including a right modified radical mastectomy versus partial mastectomy, sentinel lymph node biopsy, and postoperative radiation therapy.  I will see her back in 2 weeks with her decision.  Will schedule surgery at that time.  She will see oncology after the surgery.

## 2024-03-29 ENCOUNTER — Encounter: Payer: Self-pay | Admitting: *Deleted

## 2024-04-01 ENCOUNTER — Ambulatory Visit (INDEPENDENT_AMBULATORY_CARE_PROVIDER_SITE_OTHER): Admitting: General Surgery

## 2024-04-01 ENCOUNTER — Encounter: Payer: Self-pay | Admitting: *Deleted

## 2024-04-01 ENCOUNTER — Other Ambulatory Visit: Payer: Self-pay

## 2024-04-01 ENCOUNTER — Encounter: Payer: Self-pay | Admitting: General Surgery

## 2024-04-01 ENCOUNTER — Ambulatory Visit: Attending: Cardiology | Admitting: Cardiology

## 2024-04-01 ENCOUNTER — Encounter: Payer: Self-pay | Admitting: Cardiology

## 2024-04-01 ENCOUNTER — Other Ambulatory Visit (INDEPENDENT_AMBULATORY_CARE_PROVIDER_SITE_OTHER)

## 2024-04-01 ENCOUNTER — Telehealth: Payer: Self-pay | Admitting: *Deleted

## 2024-04-01 VITALS — BP 117/75 | HR 95 | Resp 16 | Ht 60.0 in | Wt 97.0 lb

## 2024-04-01 VITALS — BP 105/70 | HR 100 | Temp 97.5°F | Resp 20 | Ht 60.0 in | Wt 97.8 lb

## 2024-04-01 DIAGNOSIS — R002 Palpitations: Secondary | ICD-10-CM

## 2024-04-01 DIAGNOSIS — R55 Syncope and collapse: Secondary | ICD-10-CM | POA: Diagnosis not present

## 2024-04-01 DIAGNOSIS — D0511 Intraductal carcinoma in situ of right breast: Secondary | ICD-10-CM

## 2024-04-01 DIAGNOSIS — R42 Dizziness and giddiness: Secondary | ICD-10-CM | POA: Diagnosis not present

## 2024-04-01 DIAGNOSIS — R072 Precordial pain: Secondary | ICD-10-CM

## 2024-04-01 DIAGNOSIS — Z01810 Encounter for preprocedural cardiovascular examination: Secondary | ICD-10-CM | POA: Diagnosis not present

## 2024-04-01 MED ORDER — FUROSEMIDE 20 MG PO TABS: 20.0000 mg | ORAL_TABLET | Freq: Every day | ORAL | Status: AC | PRN

## 2024-04-01 NOTE — Telephone Encounter (Signed)
 Pt is already scheduled to see Katlyn West, NP, today.  Will fwd to Katlyn.

## 2024-04-01 NOTE — Patient Instructions (Signed)
 Medication Instructions:  CHANGE Furosemide  to as needed for increasing lower extremity edema(swelling) *If you need a refill on your cardiac medications before your next appointment, please call your pharmacy*  Lab Work: CBC, BMP, MAG, TSH & T4 If you have labs (blood work) drawn today and your tests are completely normal, you will receive your results only by: MyChart Message (if you have MyChart) OR A paper copy in the mail If you have any lab test that is abnormal or we need to change your treatment, we will call you to review the results.  Testing/Procedures: Lexiscan  Your physician has requested that you have a lexiscan  myoview . For further information please visit [email or web address] Please follow instruction sheet, as given.   Follow-Up: At [facility], you and your health needs are our priority.  As part of our continuing mission to provide you with exceptional heart care, our providers are all part of one team.  This team includes your primary Cardiologist (physician) and Advanced Practice Providers or APPs (Physician Assistants and Nurse Practitioners) who all work together to provide you with the care you need, when you need it.  Your next appointment:   4 week(s)  Provider:   Katlyn West, NP

## 2024-04-01 NOTE — Progress Notes (Unsigned)
 Applied a 7 day Zio XT monitor to patient in the office

## 2024-04-01 NOTE — Progress Notes (Signed)
 Cardiology Office Note    Date:  04/03/2024  ID:  Sharon Welch, DOB [birth year], MRN [id number] PCP:  Sharon Satterfield, MD  Cardiologist:  Sharon SHAUNNA Maywood, MD  Electrophysiologist:  None   Chief Complaint: Preoperative cardiac evaluation   History of Present Illness: .    Sharon Welch is a 53 y.o. female with visit-pertinent history of chest pain, shortness of breath, history of seizures, migraines, palpitations, fibromyalgia, chronic back pain, history of cervical cancer s/Sharon hysterectomy, scoliosis.  Patient was previously referred to Dr. Mallipeddi in June 2024 for evaluation of chest pain.  Patient noted having constant palpitations lasting all day and intermittent chest pains lasting a few minutes to hours, not related to exertion or rest.  Patient also reported associated shortness of breath.  She denied any leg swelling, syncope, worsening DOE.  Lexiscan  was obtained and was normal.  Event monitor was overall unremarkable.  Patient was evaluated in the ED on 12/15/2022 at [facility] for weakness, was noted that patient had substantial decline in condition after hip replacement surgery 3 weeks prior, noted multiple episodes of vomiting, retching, decreased activity.  She was treated with IV fluids and Zofran  and was in sinus rhythm.  Patient seen in clinic on 04/14/2023 by Sharon Crate, NP for lower extremity edema.  At visit patient had multiple concerns, she noted that leg edema started after she broke her hip earlier in the year and was noted by both daughters who works in [employer], reported to be overall stable with Lasix  resulting in improvement.  On arrival that day her heart rate was 121, patient reported that had been doing that for quite some time she was do breathing exercises that worked to calm her heart rate.  It was noted that she did drink caffeine.  Patient also reported having syncopal episodes that happened prior to her hip surgery and also after hip surgery earlier in the  year in May 2024.  She reports that she had passed out twice since her hip surgery, reported that 1 time she stood up using the bathroom and passed out, second time occurred while standing up.  Patient also reported chronic intermittent chest pain for the prior year.  During office visit patient reported chest pain that was short-lived, EKG was obtained during episode with no ischemic changes.  2-week ZIO monitor was obtained given recurrent syncope, this indicated normal sinus rhythm predominantly ranging from 55 to 170 bpm with an average heart rate of 85 bpm.  She had 2 runs of SVT, fastest for lasting 4 beats and the longest interval lasting 5 beats which was 2.6 seconds.  No ventricular arrhythmias, AV block or pauses.  Symptoms correlated with normal sinus rhythm and PACs.  Patient was last seen in clinic on 12//2024 by Dr. Mallipeddi, patient continued to have palpitations however denied chest pain.  Patient continued to note dizziness that Sharon Welch felt was secondary to vertigo, was started on meclizine  25 mg 3 times daily and instructed to follow-up with her PCP for evaluation of inner ear disease.  Patient noted no improvement of palpitations and metoprolol  and was started on diltiazem .  Today she presents regarding increased heart rate and palpitations with increased shortness of breath and dizziness.  Patient reports that in the last few weeks she has had increasing fatigue and has been having episodes of chest tightness and pain across her shoulder blades, not specifically associated with exertion.  Patient notes that she did have shoulder surgery earlier this  year and has difficulty differentiating between pain related to her shoulder and pain related to her heart.  Patient reports that the chest tightness does not resolve with palpation or movement of the left arm.  Patient also reports increased shortness of breath associated with palpitations and dizziness that typically occur with  position changes.  She notes that when she changes position she will become severely lightheaded and either need to hang onto the wall or sit back down.  She denies any recent syncope or true episodes of passing out.  Patient is unsure if some of her symptoms are related to her recent diagnosis of breast cancer, notes that she has had significantly increased anxiety in the last few weeks.  ROS: .   Today she denies shortness of breath, lower extremity edema, melena, hematuria, hemoptysis, diaphoresis, syncope, orthopnea, and PND.  All other systems are reviewed and otherwise negative. Studies Reviewed: Sharon Welch   EKG:  EKG is ordered today, personally reviewed, demonstrating  EKG Interpretation Date/Time:  Thursday April 01 2024 15:20:08 EDT Ventricular Rate:  86 PR Interval:  156 QRS Duration:  90 QT Interval:  380 QTC Calculation: 454 R Axis:   50  Text Interpretation: Normal sinus rhythm RSR' or QR pattern in V1 suggests right ventricular conduction delay Nonspecific T wave abnormality When compared with ECG of 30-May-2023 19:22, Fusion complexes are no longer Present Left anterior fascicular block is no longer Present Confirmed by Sharon Welch [phone]) on 04/02/2024 2:18:19 PM   CV Studies: Cardiac studies reviewed are outlined and summarized above. Otherwise please see EMR for full report. Cardiac Studies & Procedures   ______________________________________________________________________________________________   STRESS TESTS  NM MYOCAR MULTI W/SPECT W 12/16/2022  Narrative   Stress ECG is negative for ischemia and arrhythmias.   LV perfusion is normal. There is no evidence of ischemia. There is no evidence of infarction.   Left ventricular function is normal. Nuclear stress EF: 70 %.   Findings are consistent with no ischemia. The study is low risk.   ECHOCARDIOGRAM  ECHOCARDIOGRAM COMPLETE 01/21/2023  Narrative ECHOCARDIOGRAM REPORT    Patient Name:   Sharon Welch Date of Exam:  01/21/2023 Medical Rec #:  [id number]  Height:       60.0 in Accession #:    [id number] Weight:       85.0 lb Date of Birth:  06/10/1971  BSA:          1.298 m Patient Age:    52 years   BP:           120/80 mmHg Patient Gender: F          HR:           67 bpm. Exam Location:  [place]  Procedure: 2D Echo, 3D Echo, Strain Analysis, Cardiac Doppler and Color Doppler  Indications:    I50.23 Acute on chronic systolic (congestive) heart failure  History:        Patient has no prior history of Echocardiogram examinations. CHF, Signs/Symptoms:Chest Pain and Palpitations; Risk Factors:Current Smoker.  Sonographer:    Bascom Burows RCS, RVS Referring Phys: [id number] VISHNU Sharon Sharon Welch  IMPRESSIONS   1. Left ventricular ejection fraction, by estimation, is 60 to 65%. The left ventricle has normal function. The left ventricle has no regional wall motion abnormalities. Left ventricular diastolic parameters are consistent with Grade I diastolic dysfunction (impaired relaxation). Elevated left ventricular end-diastolic pressure. The E/e' is 18. 2. Right ventricular systolic function is normal. The right ventricular size  is normal. There is normal pulmonary artery systolic pressure. 3. The mitral valve is normal in structure. Mild mitral valve regurgitation. No evidence of mitral stenosis. 4. The aortic valve has an indeterminant number of cusps. Aortic valve regurgitation is not visualized. No aortic stenosis is present. 5. The inferior vena cava is normal in size with greater than 50% respiratory variability, suggesting right atrial pressure of 3 mmHg.  Comparison(s): No prior Echocardiogram.  FINDINGS Left Ventricle: Left ventricular ejection fraction, by estimation, is 60 to 65%. The left ventricle has normal function. The left ventricle has no regional wall motion abnormalities. The left ventricular internal cavity size was normal in size. There is no left ventricular hypertrophy. Left ventricular  diastolic parameters are consistent with Grade I diastolic dysfunction (impaired relaxation). Elevated left ventricular end-diastolic pressure. The E/e' is 27.  Right Ventricle: The right ventricular size is normal. No increase in right ventricular wall thickness. Right ventricular systolic function is normal. There is normal pulmonary artery systolic pressure. The tricuspid regurgitant velocity is 2.73 m/s, and with an assumed right atrial pressure of 3 mmHg, the estimated right ventricular systolic pressure is 32.8 mmHg.  Left Atrium: Left atrial size was normal in size.  Right Atrium: Right atrial size was normal in size.  Pericardium: There is no evidence of pericardial effusion.  Mitral Valve: The mitral valve is normal in structure. Mild mitral valve regurgitation. No evidence of mitral valve stenosis. MV peak gradient, 10.1 mmHg. The mean mitral valve gradient is 5.0 mmHg.  Tricuspid Valve: The tricuspid valve is normal in structure. Tricuspid valve regurgitation is mild . No evidence of tricuspid stenosis.  Aortic Valve: The aortic valve has an indeterminant number of cusps. Aortic valve regurgitation is not visualized. No aortic stenosis is present. Aortic valve mean gradient measures 3.0 mmHg. Aortic valve peak gradient measures 6.7 mmHg. Aortic valve area, by VTI measures 2.28 cm.  Pulmonic Valve: The pulmonic valve was grossly normal. Pulmonic valve regurgitation is not visualized. No evidence of pulmonic stenosis.  Aorta: The aortic root and ascending aorta are structurally normal, with no evidence of dilitation.  Venous: The inferior vena cava is normal in size with greater than 50% respiratory variability, suggesting right atrial pressure of 3 mmHg.  IAS/Shunts: No atrial level shunt detected by color flow Doppler.   LEFT VENTRICLE PLAX 2D LVIDd:         3.40 cm     Diastology LVIDs:         1.90 cm     LV e' medial:    6.20 cm/s LV PW:         1.00 cm     LV E/e'  medial:  18.4 LV IVS:        1.00 cm     LV e' lateral:   6.09 cm/s LVOT diam:     2.00 cm     LV E/e' lateral: 18.7 LV SV:         63 LV SV Index:   49 LVOT Area:     3.14 cm  3D Volume EF: LV Volumes (MOD)           3D EF:        69 % LV vol d, MOD A2C: 46.0 ml LV EDV:       85 ml LV vol d, MOD A4C: 52.6 ml LV ESV:       26 ml LV vol s, MOD A2C: 20.4 ml LV SV:  58 ml LV vol s, MOD A4C: 23.3 ml LV SV MOD A2C:     25.6 ml LV SV MOD A4C:     52.6 ml LV SV MOD BP:      28.0 ml  RIGHT VENTRICLE RV Basal diam:  2.40 cm RV Mid diam:    2.30 cm RV S prime:     11.30 cm/s TAPSE (M-mode): 2.2 cm  LEFT ATRIUM           Index        RIGHT ATRIUM          Index LA diam:      2.70 cm 2.08 cm/m   RA Area:     9.56 cm LA Vol (A2C): 37.2 ml 28.67 ml/m  RA Volume:   20.30 ml 15.65 ml/m LA Vol (A4C): 23.0 ml 17.73 ml/m AORTIC VALVE                    PULMONIC VALVE AV Area (Vmax):    2.53 cm     PV Vmax:       0.76 m/s AV Area (Vmean):   2.47 cm     PV Peak grad:  2.3 mmHg AV Area (VTI):     2.28 cm AV Vmax:           129.00 cm/s AV Vmean:          81.500 cm/s AV VTI:            0.278 m AV Peak Grad:      6.7 mmHg AV Mean Grad:      3.0 mmHg LVOT Vmax:         104.00 cm/s LVOT Vmean:        64.100 cm/s LVOT VTI:          0.202 m LVOT/AV VTI ratio: 0.73  AORTA Ao Root diam: 3.00 cm Ao Asc diam:  2.70 cm  MITRAL VALVE                TRICUSPID VALVE MV Area (PHT): 3.87 cm     TR Peak grad:   29.8 mmHg MV Area VTI:   1.51 cm     TR Vmax:        273.00 cm/s MV Peak grad:  10.1 mmHg MV Mean grad:  5.0 mmHg     SHUNTS MV Vmax:       1.59 m/s     Systemic VTI:  0.20 m MV Vmean:      107.0 cm/s   Systemic Diam: 2.00 cm MV Decel Time: 196 msec MV E velocity: 114.00 cm/s MV A velocity: 146.00 cm/s MV E/A ratio:  0.78  Vishnu Priya Sharon Welch Electronically signed by Sharon Late Sharon Welch Signature Date/Time: 01/22/2023/9:05:19 AM    Final    MONITORS  LONG  TERM MONITOR-LIVE TELEMETRY (3-14 DAYS) 05/06/2023  Narrative   Patch wear time was for 14 days and 0 hours.   Normal sinus rhythm predominantly ranging from 55 to 170 bpm with an average HR 85 bpm.   2 runs of SVT, fastest interval lasting 4 beats/1.3 seconds and the longest interval lasting 5 beats/2.6 seconds.   No ventricular arrhythmias, AV block or pauses. <1% PAC burden and <1% PVC burden.   Symptoms correlated with NSR (70-161 bpm), PAC.       ______________________________________________________________________________________________       Current Reported Medications:.    No outpatient medications have been marked as taking for the 04/01/24  encounter (Office Visit) with Tryone Kille D, NP.    Physical Exam:    VS:  BP 117/75 (BP Location: Left Arm, Patient Position: Sitting, Cuff Size: Normal)   Pulse 95   Resp 16   Ht 5' (1.524 m)   Wt 97 lb (44 kg)   SpO2 98%   BMI 18.94 kg/m    Wt Readings from Last 3 Encounters:  04/01/24 97 lb (44 kg)  04/01/24 97 lb 12.8 oz (44.4 kg)  03/16/24 102 lb (46.3 kg)    GEN: Well nourished, well developed in no acute distress NECK: No JVD; No carotid bruits CARDIAC: RRR, no murmurs, rubs, gallops RESPIRATORY:  Clear to auscultation without rales, wheezing or rhonchi  ABDOMEN: Soft, non-tender, non-distended EXTREMITIES:  No edema; No acute deformity     Asessement and Plan:.    Preoperative cardiac evaluation: Patient is planned to undergo modified right radical mastectomy with Dr. [name].  Patient notes episodes of presyncope, denies any true syncope, she also reports episodes of chest tightness.  She will wear a 1 week live ZIO monitor and undergo nuclear stress Welch for further evaluation.  Autonomic dysfunction, syncope/orthostatic dizziness, palpitations/tachycardia: Patient with history of syncope felt to be related to orthostatic hypotension.  ZIO monitor in 04/2023 showed normal sinus rhythm ranging from 55 to 170  bpm with an average heart rate of 85 bpm.  2 runs of SVT that were short in duration, symptoms correlated with sinus rhythm and PACs.  Patient reports that in recent weeks she has had increasing troubles with significant dizziness and lightheadedness with position changes, denies any true syncope.  She also reports increased episodes of feeling as though her heart is racing, notes this has been associated with increased anxiety related to recent diagnoses of breast cancer.  Patient was orthostatic today, she appears euvolemic, was previously started on Lasix  for lower extremity edema following hip surgery, instructed patient to take Lasix  only as needed for increased lower extremity edema or weight gain and will discontinue daily dosing.  Encouraged patient to increase hydration and use compression stockings.  Reviewed ED precautions. Check CBC, BMET, TSH/T4 and magnesium  level.  Echocardiogram last year reassuring.  Check 7-day live ZIO monitor.  Chest pain/fatigue: Patient reports that the last few weeks she has had increased fatigue and episodes of chest tightness as well as pain across her shoulder blades.  She does note that she had a shoulder surgery earlier this year and has had some problems with left shoulder pain however feels this tightness is different.  Tightness is not associated with exertion however does not change with palpation or movement.  She also notes increased fatigue in recent months.  As patient is planned to undergo radical mastectomy will proceed with nuclear stress Welch for further evaluation, patient notes that she would be unable to complete a treadmill Welch.  Reviewed ED precautions. Informed Consent   Shared Decision Making/Informed Consent The risks [chest pain, shortness of breath, cardiac arrhythmias, dizziness, blood pressure fluctuations, myocardial infarction, stroke/transient ischemic attack, nausea, vomiting, allergic reaction, radiation exposure, metallic taste  sensation and life-threatening complications (estimated to be 1 in 10,000)], benefits (risk stratification, diagnosing coronary artery disease, treatment guidance) and alternatives of a nuclear stress Welch were discussed in detail with Sharon Welch and she agrees to proceed.     Seizures: Patient with history of seizures, followed by [facility].    Disposition: F/u with Ilani Otterson, NP in four weeks.   Signed, Alizay Bronkema D Viggo Perko,  NP

## 2024-04-01 NOTE — Progress Notes (Signed)
 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  She reports that she has been having episodes of lightheadedness and a heart rate up to 160.  She has seen cardiology in the past but does not have a follow-up appointment.  She is here with her daughter. Objective:    BP 105/70   Pulse 100   Temp (!) 97.5 F (36.4 C) (Oral)   Resp 20   Ht 5' (1.524 m)   Wt 97 lb 12.8 oz (44.4 kg)   SpO2 96%   BMI 19.10 kg/m   General:  alert, cooperative, and no distress       Assessment:    DCIS, right breast   Possible paroxysmal atrial fibrillation with increased heart rate Plan:   Patient has elected to proceed with a right modified radical mastectomy.  The risks and benefits of the procedure including bleeding, infection, pain, and cardiopulmonary difficulties were fully explained to the patient, who gave informed consent.  I have arranged for a cardiology visit with Dr. Mallipeddi today for preoperative clearance.  Once she is cleared, we will schedule the surgery.  All questions were answered.

## 2024-04-01 NOTE — Telephone Encounter (Signed)
   Pre-operative Risk Assessment    [name]  DOB: Apr 14, 1971 MRN: [id number]   Date of last office visit: 05/28/2023 Date of next office visit: N/A   Request for Surgical Clearance    Procedure:  MODIFIED RADICAL MASTECTOMY, RIGHT,   Date of Surgery:  Clearance TBD                                Surgeon:  [name], MD Surgeon's Group or Practice Name:  [facility] Phone number:  [phone] Fax number:  [phone]   Type of Clearance Requested:   - Medical    Type of Anesthesia:  General    Additional requests/questions:    SignedApolinar Essex   04/01/2024, 12:03 PM

## 2024-04-02 ENCOUNTER — Telehealth (HOSPITAL_COMMUNITY): Payer: Self-pay | Admitting: *Deleted

## 2024-04-02 ENCOUNTER — Other Ambulatory Visit: Payer: Self-pay | Admitting: Cardiology

## 2024-04-02 DIAGNOSIS — R072 Precordial pain: Secondary | ICD-10-CM

## 2024-04-02 DIAGNOSIS — R55 Syncope and collapse: Secondary | ICD-10-CM

## 2024-04-02 NOTE — Telephone Encounter (Signed)
 Per DPR left stress test insructions on voicemail.

## 2024-04-03 ENCOUNTER — Encounter: Payer: Self-pay | Admitting: Cardiology

## 2024-04-03 LAB — BASIC METABOLIC PANEL WITH GFR
BUN/Creatinine Ratio: 19 (ref 9–23)
BUN: 13 mg/dL (ref 6–24)
CO2: 23 mmol/L (ref 20–29)
Calcium: 9 mg/dL (ref 8.7–10.2)
Chloride: 102 mmol/L (ref 96–106)
Creatinine, Ser: 0.69 mg/dL (ref 0.57–1.00)
Glucose: 86 mg/dL (ref 70–99)
Potassium: 3.3 mmol/L — ABNORMAL LOW (ref 3.5–5.2)
Sodium: 142 mmol/L (ref 134–144)
eGFR: 102 mL/min/1.73 (ref >59)

## 2024-04-03 LAB — CBC
Hematocrit: 42.6 % (ref 34.0–46.6)
Hemoglobin: 14.6 g/dL (ref 11.1–15.9)
MCH: 32.2 pg (ref 26.6–33.0)
MCHC: 34.3 g/dL (ref 31.5–35.7)
MCV: 94 fL (ref 79–97)
Platelets: 335 x10E3/uL (ref 150–450)
RBC: 4.53 x10E6/uL (ref 3.77–5.28)
RDW: 12.8 % (ref 11.7–15.4)
WBC: 6.7 x10E3/uL (ref 3.4–10.8)

## 2024-04-03 LAB — T4, FREE: Free T4: 1.12 ng/dL (ref 0.82–1.77)

## 2024-04-03 LAB — TSH: TSH: 0.475 u[IU]/mL (ref 0.450–4.500)

## 2024-04-03 LAB — MAGNESIUM: Magnesium: 1.8 mg/dL (ref 1.6–2.3)

## 2024-04-04 ENCOUNTER — Ambulatory Visit: Payer: Self-pay | Admitting: Cardiology

## 2024-04-04 DIAGNOSIS — R002 Palpitations: Secondary | ICD-10-CM

## 2024-04-05 ENCOUNTER — Other Ambulatory Visit (HOSPITAL_COMMUNITY): Payer: Self-pay

## 2024-04-05 ENCOUNTER — Ambulatory Visit (HOSPITAL_COMMUNITY)
Admission: RE | Admit: 2024-04-05 | Discharge: 2024-04-05 | Disposition: A | Discharge status: Home / Self Care | Source: Ambulatory Visit | Attending: Cardiovascular Disease | Admitting: Cardiovascular Disease

## 2024-04-05 DIAGNOSIS — R55 Syncope and collapse: Secondary | ICD-10-CM | POA: Diagnosis not present

## 2024-04-05 DIAGNOSIS — R072 Precordial pain: Secondary | ICD-10-CM | POA: Insufficient documentation

## 2024-04-05 MED ORDER — TECHNETIUM TC 99M TETROFOSMIN IV KIT
32.3000 | PACK | Freq: Once | INTRAVENOUS | Status: AC | PRN
  Administered 2024-04-05: 32.3 via INTRAVENOUS

## 2024-04-05 MED ORDER — POTASSIUM CHLORIDE CRYS ER 20 MEQ PO TBCR
40.0000 meq | EXTENDED_RELEASE_TABLET | Freq: Every day | ORAL | 0 refills | Status: DC
  Filled 2024-04-05: qty 2, 1d supply, fill #0

## 2024-04-05 MED ORDER — REGADENOSON 0.4 MG/5ML IV SOLN
0.4000 mg | Freq: Once | INTRAVENOUS | Status: AC
  Administered 2024-04-05: 0.4 mg via INTRAVENOUS

## 2024-04-05 MED ORDER — TECHNETIUM TC 99M TETROFOSMIN IV KIT
10.8000 | PACK | Freq: Once | INTRAVENOUS | Status: AC | PRN
  Administered 2024-04-05: 10.8 via INTRAVENOUS

## 2024-04-05 MED ORDER — REGADENOSON 0.4 MG/5ML IV SOLN
INTRAVENOUS | Status: AC
  Filled 2024-04-05: qty 5

## 2024-04-06 LAB — MYOCARDIAL PERFUSION IMAGING
LV dias vol: 38 mL (ref 46–106)
LV sys vol: 4 mL
Nuc Stress EF: 89 %
Peak HR: 104 {beats}/min
Rest HR: 83 {beats}/min
Rest Nuclear Isotope Dose: 10.8 mCi
SDS: 4
SRS: 3
SSS: 7
ST Depression (mm): 0 mm
Stress Nuclear Isotope Dose: 32.3 mCi
TID: 0.74

## 2024-04-07 ENCOUNTER — Ambulatory Visit: Payer: Self-pay | Admitting: Cardiology

## 2024-04-08 ENCOUNTER — Encounter: Payer: Self-pay | Admitting: Cardiology

## 2024-04-08 NOTE — Progress Notes (Signed)
 Patient is now with [facility]. Faxed imaging results to new pcp Gloria Zarwolo. Patient has first appointment is November.

## 2024-04-16 ENCOUNTER — Other Ambulatory Visit: Payer: Self-pay | Admitting: Internal Medicine

## 2024-04-21 ENCOUNTER — Telehealth: Payer: Self-pay | Admitting: Cardiology

## 2024-04-21 LAB — BASIC METABOLIC PANEL WITH GFR
BUN/Creatinine Ratio: 11 (ref 9–23)
BUN: 9 mg/dL (ref 6–24)
CO2: 25 mmol/L (ref 20–29)
Calcium: 9.9 mg/dL (ref 8.7–10.2)
Chloride: 100 mmol/L (ref 96–106)
Creatinine, Ser: 0.85 mg/dL (ref 0.57–1.00)
Glucose: 93 mg/dL (ref 70–99)
Potassium: 4 mmol/L (ref 3.5–5.2)
Sodium: 141 mmol/L (ref 134–144)
eGFR: 81 mL/min/1.73 (ref >59)

## 2024-04-21 NOTE — Telephone Encounter (Signed)
Patient is returning call to review lab results.  

## 2024-04-21 NOTE — Telephone Encounter (Signed)
 pt aware of results

## 2024-04-26 ENCOUNTER — Other Ambulatory Visit: Payer: Self-pay | Admitting: Medical Genetics

## 2024-04-26 DIAGNOSIS — Z006 Encounter for examination for normal comparison and control in clinical research program: Secondary | ICD-10-CM

## 2024-04-28 ENCOUNTER — Telehealth: Payer: Self-pay | Admitting: *Deleted

## 2024-04-28 ENCOUNTER — Other Ambulatory Visit: Payer: Self-pay | Admitting: *Deleted

## 2024-04-28 DIAGNOSIS — D0511 Intraductal carcinoma in situ of right breast: Secondary | ICD-10-CM

## 2024-04-28 MED ORDER — LEVETIRACETAM 500 MG PO TABS: 500.0000 mg | ORAL_TABLET | Freq: Two times a day (BID) | ORAL | 0 refills | Status: DC

## 2024-04-28 NOTE — Telephone Encounter (Signed)
 Patient was evaluated in office on 04/01/2024.   Nuclear Stress Test and ZIO monitor x1 week ordered.   These have been completed.   Please advise on risk assessment to proceed with modified radical mastectomy, right

## 2024-04-29 NOTE — Telephone Encounter (Signed)
   [name]  DOB: [birth year] MRN: [id number]  Primary Cardiologist: Diannah SHAUNNA Maywood, MD  Chart reviewed as part of pre-operative protocol coverage.  Patient was seen in clinic on 04/01/2024 with episodes of chest discomfort, palpitations and shortness of breath as well as dizziness, patient's Lasix  was changed to as needed, given concerns for dehydration.  Patient's lab work was reassuring and she underwent nuclear stress test on 04/05/2024 that was normal and low risk.  Patient's cardiac monitor indicated an average heart rate of 87 bpm with predominant underlying rhythm being sinus rhythm.  Patient was contacted today regarding preoperative evaluation.  Patient reports that she is doing well, denies any further chest pain or shortness of breath, dizziness has improved with reduced doses of Lasix .  She denies any cardiac concerns or complaints today.  Given past medical history and time since last visit, based on ACC/AHA guidelines, Sharon Welch is at acceptable risk for the planned procedure without further cardiovascular testing.   The patient was advised that if she develops new symptoms prior to surgery to contact our office to arrange for a follow-up visit, and she verbalized understanding.  I will route this recommendation to the requesting party via Epic fax function and remove from pre-op pool.  Please call with questions.  Vonnetta Akey D Sybrina Laning, NP 04/29/2024, 11:55 AM

## 2024-05-03 NOTE — H&P (Signed)
 Sharon Welch; [id number]; [birth year]     HPI Patient is a 53 year old white female who is referred [facility] and Dr. Bertell for evaluation and treatment of newly diagnosed right DCIS.  This was found on routine mammography.  There are 2 areas in the lower, inner quadrant of the right breast which are positive for DCIS.  Patient denies feeling any masses.  She denies any nipple discharge.  She denies any immediate family history of breast cancer.     Past Medical History:  Diagnosis Date   Asthma      seasonal   Bruising, spontaneous 12/08/2013   Cancer (HCC)      cervical cancer - had hysterectomy   Chronic back pain     Chronic back pain     DDD (degenerative disc disease), lumbar     DDD (degenerative disc disease), lumbar     Dysrhythmia      tachycardia   Fibromyalgia     Headache(784.0)      migraines   History of kidney stones     Ovarian cyst     Partial tear subscapularis tendon 12/27/2011   Scoliosis     Seizures (HCC)      started 9/12-               Past Surgical History:  Procedure Laterality Date   ABDOMINAL HYSTERECTOMY   2012   BACK SURGERY   1989    scoliosis throsic-rods   BREAST BIOPSY Right 02/25/2024    MM RT BREAST BX W LOC DEV 1ST LESION IMAGE BX SPEC STEREO GUIDE 02/25/2024 [facility]   BREAST BIOPSY Right 02/25/2024    MM RT BREAST BX W LOC DEV EA AD LESION IMG BX SPEC STEREO GUIDE 02/25/2024 [facility]   CESAREAN SECTION       CHOLECYSTECTOMY N/A 11/17/2012    Procedure: LAPAROSCOPIC CHOLECYSTECTOMY WITH INTRAOPERATIVE CHOLANGIOGRAM;  Surgeon: Lynda Leos, MD;  Location: [facility];  Service: General;  Laterality: N/A;   COLONOSCOPY WITH PROPOFOL  N/A 04/21/2020    Procedure: COLONOSCOPY WITH PROPOFOL ;  Surgeon: Rollin Dover, MD;  Location: [facility];  Service: Endoscopy;  Laterality: N/A;   ENDOSCOPIC RETROGRADE CHOLANGIOPANCREATOGRAPHY (ERCP) WITH PROPOFOL  N/A 06/11/2019    Procedure: ENDOSCOPIC RETROGRADE  CHOLANGIOPANCREATOGRAPHY (ERCP) WITH PROPOFOL ;  Surgeon: Rollin Dover, MD;  Location: [facility];  Service: Endoscopy;  Laterality: N/A;   ERCP N/A 05/07/2013    Procedure: ENDOSCOPIC RETROGRADE CHOLANGIOPANCREATOGRAPHY (ERCP);  Surgeon: Dover JONETTA Rollin, MD;  Location: [facility];  Service: Endoscopy;  Laterality: N/A;  start ercp first, if canulation fails switch to EUS    ESOPHAGOGASTRODUODENOSCOPY (EGD) WITH PROPOFOL  N/A 05/15/2019    Procedure: ESOPHAGOGASTRODUODENOSCOPY (EGD) WITH PROPOFOL ;  Surgeon: Harvey Margo CROME, MD;  [facility]: [facility];  Service: Endoscopy;  Laterality: N/A;   ESOPHAGOGASTRODUODENOSCOPY (EGD) WITH PROPOFOL  N/A 03/03/2020    Procedure: ESOPHAGOGASTRODUODENOSCOPY (EGD) WITH PROPOFOL ;  Surgeon: Rollin Dover, MD;  Location: [facility];  Service: Endoscopy;  Laterality: N/A;   ESOPHAGOGASTRODUODENOSCOPY (EGD) WITH PROPOFOL  N/A 04/21/2020    Procedure: ESOPHAGOGASTRODUODENOSCOPY (EGD) WITH PROPOFOL ;  Surgeon: Rollin Dover, MD;  Location: [facility];  Service: Endoscopy;  Laterality: N/A;   EUS N/A 11/03/2012    Procedure: UPPER ENDOSCOPIC ULTRASOUND (EUS) LINEAR;  Surgeon: Dover JONETTA Rollin, MD;  Location: [facility];  Service: Endoscopy;  Laterality: N/A;   left arm       NECK SURGERY       POLYPECTOMY   04/21/2020  Procedure: POLYPECTOMY;  Surgeon: Rollin Dover, MD;  Location: [facility];  Service: Endoscopy;;   REMOVAL OF STONES   06/11/2019    Procedure: REMOVAL OF STONES;  Surgeon: Rollin Dover, MD;  Location: [facility];  Service: Endoscopy;;   TOOTH EXTRACTION N/A 05/24/2022    Procedure: DENTAL RESTORATION/EXTRACTIONS;  Surgeon: Sheryle Hamilton, DMD;  Location: [facility];  Service: Oral Surgery;  Laterality: N/A;   TOTAL HIP ARTHROPLASTY Left 11/17/2022    Procedure: TOTAL HIP ARTHROPLASTY ANTERIOR APPROACH;  Surgeon: Beverley Evalene BIRCH, MD;  Location: [facility];  Service: Orthopedics;  Laterality: Left;   TOTAL SHOULDER ARTHROPLASTY Left 11/04/2023     Procedure: ARTHROPLASTY, SHOULDER, TOTAL;  Surgeon: Josefina Chew, MD;  Location: [facility];  Service: Orthopedics;  Laterality: Left;   TUBAL LIGATION       UPPER ESOPHAGEAL ENDOSCOPIC ULTRASOUND (EUS) N/A 06/11/2019    Procedure: UPPER ESOPHAGEAL ENDOSCOPIC ULTRASOUND (EUS);  Surgeon: Rollin Dover, MD;  Location: [facility];  Service: Endoscopy;  Laterality: N/A;               Family History  Problem Relation Age of Onset   Hypertension Mother     Hypertension Father     Cancer Father          prostate   Heart disease Father     Hypertension Brother     Hypertension Brother     Breast cancer Neg Hx            Medications Ordered Prior to Encounter        Current Outpatient Medications on File Prior to Visit  Medication Sig Dispense Refill   albuterol  (VENTOLIN  HFA) 108 (90 Base) MCG/ACT inhaler Inhale 1-2 puffs into the lungs every 4 (four) hours as needed for wheezing or shortness of breath.       ALPRAZolam  (XANAX ) 0.25 MG tablet Take 0.25-0.5 mg by mouth 4 (four) times daily as needed for anxiety or sleep.       amitriptyline  (ELAVIL ) 25 MG tablet Take 50 mg by mouth at bedtime.       Compression Bandages KIT 1 each by Does not apply route daily. 1 kit 0   dexlansoprazole (DEXILANT) 60 MG capsule Take 60 mg by mouth daily.       diltiazem  (CARDIZEM  CD) 180 MG 24 hr capsule TAKE ONE CAPSULE BY MOUTH EVERY DAY 30 capsule 5   folic acid  (FOLVITE ) 1 MG tablet Take 1 mg by mouth daily.       furosemide  (LASIX ) 20 MG tablet Take 1 tablet (20 mg total) by mouth daily. 90 tablet 1   HYDROcodone -acetaminophen  (NORCO) 10-325 MG tablet Take 1 tablet by mouth in the morning, at noon, in the evening, and at bedtime.       lamoTRIgine  (LAMICTAL ) 150 MG tablet Take 1 tablet (150 mg total) by mouth 2 (two) times daily. 60 tablet 11   levETIRAcetam  (KEPPRA ) 500 MG tablet Take 1 tablet (500 mg total) by mouth 2 (two) times daily. 60 tablet 5   linaclotide  (LINZESS ) 145 MCG CAPS capsule Take  145 mcg by mouth daily before breakfast.       meclizine  (ANTIVERT ) 25 MG tablet Take 1 tablet (25 mg total) by mouth 3 (three) times daily. 30 tablet 0   methocarbamol  (ROBAXIN ) 500 MG tablet Take 500 mg by mouth 3 (three) times daily.       Milnacipran  (SAVELLA ) 50 MG TABS tablet Take 50 mg by mouth 2 (two) times daily.  promethazine  (PHENERGAN ) 25 MG tablet Take 25 mg by mouth every 6 (six) hours as needed for nausea or vomiting.       QULIPTA  60 MG TABS Take 1 tablet (60 mg total) by mouth daily. 30 tablet 11   rizatriptan (MAXALT) 10 MG tablet Take 10 mg by mouth See admin instructions. 10 mg as needed for migraine, may repeat 10 mg in 2 hours if headache persists or recurs.        No current facility-administered medications on file prior to visit.        Allergies       Allergies  Allergen Reactions   Ultram [Tramadol] Other (See Comments)      Possible seizures   Aleve [Naproxen] Other (See Comments)      Ulcers   Blueberry [Vaccinium Angustifolium] Swelling   Coconut (Cocos Nucifera) Swelling   Depakote  [Divalproex  Sodium] Other (See Comments)      Insomnia . (Changed from divalproex  to Lamictal  due to side effects)   Nsaids Other (See Comments)      History of bleeding ulcers.   Pineapple Other (See Comments)      Migraine        Social History       Substance and Sexual Activity  Alcohol Use Not Currently      Tobacco Use History  Social History        Tobacco Use  Smoking Status Every Day   Current packs/day: 0.50   Average packs/day: 0.5 packs/day for 20.0 years (10.0 ttl pk-yrs)   Types: Cigarettes  Smokeless Tobacco Never        Review of Systems  Constitutional: Negative.   HENT: Negative.    Eyes: Negative.   Respiratory:  Positive for wheezing.   Cardiovascular: Negative.   Gastrointestinal:  Positive for nausea.  Genitourinary: Negative.   Musculoskeletal:  Positive for back pain, joint pain and neck pain.  Skin: Negative.    Neurological:  Positive for dizziness, sensory change and headaches.  Endo/Heme/Allergies: Negative.   Psychiatric/Behavioral: Negative.       Objective    Vitals:    03/16/24 1019  BP: 98/60  Pulse: 89  Resp: 14  Temp: 97.8 F (36.6 C)  SpO2: 96%      Physical Exam Vitals reviewed.  Constitutional:      Appearance: Normal appearance. She is normal weight. She is not ill-appearing.  HENT:     Head: Normocephalic and atraumatic.  Cardiovascular:     Rate and Rhythm: Normal rate and regular rhythm.     Heart sounds: Normal heart sounds. No murmur heard.    No friction rub. No gallop.  Pulmonary:     Effort: Pulmonary effort is normal. No respiratory distress.     Breath sounds: Normal breath sounds. No stridor. No wheezing, rhonchi or rales.  Musculoskeletal:     Cervical back: Normal range of motion and neck supple.  Lymphadenopathy:     Cervical: No cervical adenopathy.  Skin:    General: Skin is warm and dry.  Neurological:     Mental Status: She is alert and oriented to person, place, and time.   Breast: No dominant mass, nipple discharge, or dimpling noted in either breast.  Axilla is negative for lymphadenopathy.   Mammogram and pathology reports reviewed   Assessment Newly diagnosed DCIS, right breast.  ER positive, PR negative, HER2 pending Plan I discussed both surgical options with the patient including a right modified radical mastectomy versus partial mastectomy,  sentinel lymph node biopsy, and postoperative radiation therapy.  Patient has elected to proceed with a right modified radical mastectomy.  Patient has been cleared by cardiology.  The risks and benefits of the procedure including bleeding, infection, and the possibility of a blood transfusion were fully explained to the patient, who gave informed consent.

## 2024-05-03 NOTE — Addendum Note (Signed)
 Addended by: SAUNDRA TAWNI DEL on: 05/03/2024 09:17 AM   Modules accepted: Orders

## 2024-05-03 NOTE — Telephone Encounter (Signed)
 Received cardiac clearance.   Discussed with Dr. Mavis and patient. Procedure scheduled.

## 2024-05-03 NOTE — Patient Instructions (Signed)
 Your procedure is scheduled on: 05/06/2024  Report to [facility] Main Entrance at    11:00 AM.  Call this number if you have problems the morning of surgery: [phone]   Remember:   Do not Eat after midnight, You may have CLEAR liquids until  8:45 am  water  tea, soft drinks, and/or Black Coffee, no CREAM OR MILK        No Smoking the morning of surgery  :  Take these medicines the morning of surgery with A SIP OF WATER : Dexilant, Diltizem, lamictal , qulipta , meclizine , maxalt and/or phenergan  if needed Use inhalers if needed   Do not wear jewelry, make-up or nail polish.  Do not wear lotions, powders, or perfumes. You may wear deodorant.  Do not shave 48 hours prior to surgery. Men may shave face and neck.  Do not bring valuables to the hospital.  Contacts, dentures or bridgework may not be worn into surgery.  Leave suitcase in the car. After surgery it may be brought to your room.  For patients admitted to the hospital, checkout time is 11:00 AM the day of discharge.   Patients discharged the day of surgery will not be allowed to drive home.    Special Instructions: Shower using CHG night before surgery and shower the day of surgery use CHG.  Use special wash - you have one bottle of CHG for all showers.  You should use approximately 1/2 of the bottle for each shower. How to Use Chlorhexidine  at Home in the Shower Chlorhexidine  gluconate (CHG) is a germ-killing (antiseptic) wash that's used to clean the skin. It can get rid of the germs that normally live on the skin and can keep them away for about 24 hours. If you're having surgery, you may be told to shower with CHG at home the night before surgery. This can help lower your risk for infection. To use CHG wash in the shower, follow the steps below. Supplies needed: CHG body wash. Clean washcloth. Clean towel. How to use CHG in the shower Follow these steps unless you're told to use CHG in a different way: Start the  shower. Use your normal soap and shampoo to wash your face and hair. Turn off the shower or move out of the shower stream. Pour CHG onto a clean washcloth. Do not use any type of brush or rough sponge. Start at your neck, washing your body down to your toes. Make sure you: Wash the part of your body where the surgery will be done for at least 1 minute. Do not scrub. Do not use CHG on your head or face unless your health care provider tells you to. If it gets into your ears or eyes, rinse them well with water . Do not wash your genitals with CHG. Wash your back and under your arms. Make sure to wash skin folds. Let the CHG sit on your skin for 1-2 minutes or as long as told. Rinse your entire body in the shower, including all body creases and folds. Turn off the shower. Dry off with a clean towel. Do not put anything on your skin afterward, such as powder, lotion, or perfume. Put on clean clothes or pajamas. If it's the night before surgery, sleep in clean sheets. General tips Use CHG only as told, and follow the instructions on the label. Use the full amount of CHG as told. This is often one bottle. Do not smoke and stay away from flames after using CHG. Your skin may  feel sticky after using CHG. This is normal. The sticky feeling will go away as the CHG dries. Do not use CHG: If you have a chlorhexidine  allergy or have reacted to chlorhexidine  in the past. On open wounds or areas of skin that have broken skin, cuts, or scrapes. On babies younger than 19 months of age. Contact a health care provider if: You have questions about using CHG. Your skin gets irritated or itchy. You have a rash after using CHG. You swallow any CHG. Call your local poison control center [phone] in the [place] Your eyes itch badly, or they become very red or swollen. Your hearing changes. You have trouble seeing. If you can't reach your provider, go to an urgent care or emergency room. Do not drive  yourself. Get help right away if: You have swelling or tingling in your mouth or throat. You make high-pitched whistling sounds when you breathe, most often when you breathe out (wheeze). You have trouble breathing. These symptoms may be an emergency. Call 911 right away. Do not wait to see if the symptoms will go away. Do not drive yourself to the hospital. This information is not intended to replace advice given to you by your health care provider. Make sure you discuss any questions you have with your health care provider. Document Revised: 12/24/2022 Document Reviewed: 12/20/2021 Elsevier Patient Education  2024 [employer]  Total or Modified Radical Mastectomy, Care After The following information offers guidance on how to care for yourself after your procedure. Your health care provider may also give you more specific instructions. If you have problems or questions, contact your health care provider. What can I expect after the procedure? After the procedure, it is common to have: Pain, soreness, or tenderness. Numbness. Swelling. Neuropathic (nerve) pain in the chest wall, armpit, or arm. Stiffness in the arm or shoulder. Feelings of stress, sadness, or depression. If the lymph nodes under your arm were removed, you may have arm swelling, weakness, or numbness on the same side of your body as your surgery. Follow these instructions at home: Incision care  Follow instructions from your health care provider about how to take care of your incision. Make sure you: Wash your hands with soap and water  for at least 20 seconds before you change your bandage (dressing). If soap and water  are not available, use hand sanitizer. Change your dressing as told by your health care provider. Leave stitches (sutures), skin glue, or adhesive strips in place. These skin closures may need to stay in place for 2 weeks or longer. If adhesive strip edges start to loosen and curl up, you may trim the  loose edges. Do not remove adhesive strips completely unless your health care provider tells you to do that. Check your incision area every day for signs of infection. Check for: Redness, swelling, or more pain. Fluid or blood. Warmth. Pus or a bad smell. If you were sent home with a surgical drain in place, follow instructions from your health care provider. This may include emptying the drain and keeping track of the amount of drainage. Bathing Do not take baths, swim, or use a hot tub until your health care provider approves. Ask your health care provider if you may take showers. You may only be allowed to take sponge baths. Activity  Return to your normal activities as told by your health care provider. Ask your health care provider what activities are safe for you. It may take several weeks to return to  full activity. Avoid activities that take a lot of effort. Rest as told by your health care provider. Ask friends and family to help with chores, including child care, meal preparation, laundry, and shopping. Be careful to avoid any activities that could cause an injury to your arm on the side of your surgery. Do not lift anything that is heavier than 10 lb (4.5 kg), or the limit that you are told, until your health care provider says that it is safe. Avoid lifting with the arm on the side of your surgery. Do not carry heavy objects on your shoulder. After your drain is removed, do exercises to prevent stiffness and swelling in your arm. Talk with your health care provider about which exercises are safe for you. General instructions Take over-the-counter and prescription medicines only as told by your health care provider. You may eat what you usually do. Keep your arm raised (elevated) above the level of your heart when you are sitting or lying down. Do not wear tight jewelry on your arm, wrist, or fingers on the side of your surgery. You may be given a tight sleeve (compression  bandage) to wear over your arm on the side of your surgery. Wear this sleeve as told by your health care provider. Ask your health care provider when you can start wearing a bra or using a breast prosthesis. Before you are involved in certain procedures, such as giving blood or having your blood pressure checked, tell all of your health care providers if lymph nodes under your arm were removed. This is important information. Follow-up Keep all follow-up visits. This is important. Get checked for extra fluid around your lymph nodes (lymphedema) as often as told by your health care provider. Medicines Take over-the-counter and prescription medicines as told by your health care provider. Take your antibiotic medicine as told by your health care provider. Do not stop taking the antibiotic even if you start to feel better. Ask your health care provider if the medicine prescribed to you: Requires you to avoid driving or using machinery. Can cause constipation. You may need to take these actions to prevent or treat constipation: Drink enough fluid to keep your urine pale yellow. Take over-the-counter or prescription medicines. Eat foods that are high in fiber, such as beans, whole grains, and fresh fruits and vegetables. Limit foods that are high in fat and processed sugars, such as fried or sweet foods. Lifestyle Do not use any products that contain nicotine  or tobacco before the procedure. These products include cigarettes, chewing tobacco, and vaping devices, such as e-cigarettes. These can delay incision healing. If you need help quitting, ask your health care provider. Contact a health care provider if: You have chills or a fever. Your pain medicine is not working. Your arm swelling, weakness, or numbness has not improved after a few weeks. You have new swelling in your breast area or arm. You have redness, swelling, or more pain in your incision area. You have fluid or blood coming from your  incision. Your incision feels warm to the touch. You have pus or a bad smell coming from your incision. Get help right away if: You have very bad pain in your breast area or arm. You have a sudden onset of chest pain. You have a fast heart rate. You have difficulty breathing. These symptoms may be an emergency. Get help right away. Call 911. Do not wait to see if the symptoms will go away. Do not drive yourself to the  hospital. Summary Follow instructions from your health care provider about how to take care of your incision. Check your incision area every day for signs of infection. Ask your health care provider what activities are safe for you. Keep all follow-up visits. This is important. Contact a health care provider if you have new swelling in your breast area or arm. This information is not intended to replace advice given to you by your health care provider. Make sure you discuss any questions you have with your health care provider. Document Revised: 03/11/2021 Document Reviewed: 03/11/2021 Elsevier Patient Education  2024 Else[employer] Inc.  General Anesthesia, Adult, Care After The following information offers guidance on how to care for yourself after your procedure. Your health care provider may also give you more specific instructions. If you have problems or questions, contact your health care provider. What can I expect after the procedure? After the procedure, it is common for people to: Have pain or discomfort at the IV site. Have nausea or vomiting. Have a sore throat or hoarseness. Have trouble concentrating. Feel cold or chills. Feel weak, sleepy, or tired (fatigue). Have soreness and body aches. These can affect parts of the body that were not involved in surgery. Follow these instructions at home: For the time period you were told by your health care provider:  Rest. Do not participate in activities where you could fall or become injured. Do not drive or use  machinery. Do not drink alcohol. Do not take sleeping pills or medicines that cause drowsiness. Do not make important decisions or sign legal documents. Do not take care of children on your own. General instructions Drink enough fluid to keep your urine pale yellow. If you have sleep apnea, surgery and certain medicines can increase your risk for breathing problems. Follow instructions from your health care provider about wearing your sleep device: Anytime you are sleeping, including during daytime naps. While taking prescription pain medicines, sleeping medicines, or medicines that make you drowsy. Return to your normal activities as told by your health care provider. Ask your health care provider what activities are safe for you. Take over-the-counter and prescription medicines only as told by your health care provider. Do not use any products that contain nicotine  or tobacco. These products include cigarettes, chewing tobacco, and vaping devices, such as e-cigarettes. These can delay incision healing after surgery. If you need help quitting, ask your health care provider. Contact a health care provider if: You have nausea or vomiting that does not get better with medicine. You vomit every time you eat or drink. You have pain that does not get better with medicine. You cannot urinate or have bloody urine. You develop a skin rash. You have a fever. Get help right away if: You have trouble breathing. You have chest pain. You vomit blood. These symptoms may be an emergency. Get help right away. Call 911. Do not wait to see if the symptoms will go away. Do not drive yourself to the hospital. Summary After the procedure, it is common to have a sore throat, hoarseness, nausea, vomiting, or to feel weak, sleepy, or fatigue. For the time period you were told by your health care provider, do not drive or use machinery. Get help right away if you have difficulty breathing, have chest pain, or  vomit blood. These symptoms may be an emergency. This information is not intended to replace advice given to you by your health care provider. Make sure you discuss any questions you have with  your health care provider. Document Revised: 09/07/2021 Document Reviewed: 09/07/2021 Elsevier Patient Education  2024 [employer].  How to Use an Incentive Spirometer An incentive spirometer is a tool that measures how well you are filling your lungs with each breath. Learning to take long, deep breaths using this tool can help you keep your lungs clear and active. This may help to reverse or lessen your chance of developing breathing (pulmonary) problems, especially infection. You may be asked to use a spirometer: After a surgery. If you have a lung problem or a history of smoking. After a long period of time when you have been unable to move or be active. If the spirometer includes an indicator to show the highest number that you have reached, your health care provider or respiratory therapist will help you set a goal. Keep a log of your progress as told by your health care provider. What are the risks? Breathing too quickly may cause dizziness or cause you to pass out. Take your time so you do not get dizzy or light-headed. If you are in pain, you may need to take pain medicine before doing incentive spirometry. It is harder to take a deep breath if you are having pain. How to use your incentive spirometer  Sit up on the edge of your bed or on a chair. Hold the incentive spirometer so that it is in an upright position. Before you use the spirometer, breathe out normally. Place the mouthpiece in your mouth. Make sure your lips are closed tightly around it. Breathe in slowly and as deeply as you can through your mouth, causing the piston or the ball to rise toward the top of the chamber. Hold your breath for 3-5 seconds, or for as long as possible. If the spirometer includes a coach indicator, use  this to guide you in breathing. Slow down your breathing if the indicator goes above the marked areas. Remove the mouthpiece from your mouth and breathe out normally. The piston or ball will return to the bottom of the chamber. Rest for a few seconds, then repeat the steps 10 or more times. Take your time and take a few normal breaths between deep breaths so that you do not get dizzy or light-headed. Do this every 1-2 hours when you are awake. If the spirometer includes a goal marker to show the highest number you have reached (best effort), use this as a goal to work toward during each repetition. After each set of 10 deep breaths, cough a few times. This will help to make sure that your lungs are clear. If you have an incision on your chest or abdomen from surgery, place a pillow or a rolled-up towel firmly against the incision when you cough. This can help to reduce pain while taking deep breaths and coughing. General tips When you are able to get out of bed: Walk around often. Continue to take deep breaths and cough in order to clear your lungs. Keep using the incentive spirometer until your health care provider says it is okay to stop using it. If you have been in the hospital, you may be told to keep using the spirometer at home. Contact a health care provider if: You are having difficulty using the spirometer. You have trouble using the spirometer as often as instructed. Your pain medicine is not giving enough relief for you to use the spirometer as told. You have a fever. Get help right away if: You develop shortness of breath. You  develop a cough with bloody mucus from the lungs. You have fluid or blood coming from an incision site after you cough. Summary An incentive spirometer is a tool that can help you learn to take long, deep breaths to keep your lungs clear and active. You may be asked to use a spirometer after a surgery, if you have a lung problem or a history of smoking, or  if you have been inactive for a long period of time. Use your incentive spirometer as instructed every 1-2 hours while you are awake. If you have an incision on your chest or abdomen, place a pillow or a rolled-up towel firmly against your incision when you cough. This will help to reduce pain. Get help right away if you have shortness of breath, you cough up bloody mucus, or blood comes from your incision when you cough. This information is not intended to replace advice given to you by your health care provider. Make sure you discuss any questions you have with your health care provider. Document Revised: 04/18/2023 Document Reviewed: 04/18/2023 Elsevier Patient Education  2024 [employer].

## 2024-05-04 ENCOUNTER — Ambulatory Visit (HOSPITAL_COMMUNITY)
Admission: RE | Admit: 2024-05-04 | Discharge: 2024-05-04 | Disposition: A | Discharge status: Home / Self Care | Source: Ambulatory Visit | Attending: General Surgery | Admitting: General Surgery

## 2024-05-04 ENCOUNTER — Ambulatory Visit: Admitting: Cardiology

## 2024-05-04 ENCOUNTER — Encounter (HOSPITAL_COMMUNITY): Payer: Self-pay

## 2024-05-04 ENCOUNTER — Encounter (HOSPITAL_COMMUNITY)
Admission: RE | Admit: 2024-05-04 | Discharge: 2024-05-04 | Disposition: A | Discharge status: Home / Self Care | Source: Ambulatory Visit | Attending: General Surgery | Admitting: General Surgery

## 2024-05-04 ENCOUNTER — Other Ambulatory Visit: Payer: Self-pay

## 2024-05-04 DIAGNOSIS — Z01818 Encounter for other preprocedural examination: Secondary | ICD-10-CM | POA: Diagnosis present

## 2024-05-04 LAB — CBC WITH DIFFERENTIAL/PLATELET
Abs Immature Granulocytes: 0.07 K/uL (ref 0.00–0.07)
Basophils Absolute: 0.1 K/uL (ref 0.0–0.1)
Basophils Relative: 1 %
Eosinophils Absolute: 0 K/uL (ref 0.0–0.5)
Eosinophils Relative: 0 %
HCT: 48.6 % — ABNORMAL HIGH (ref 36.0–46.0)
Hemoglobin: 16.8 g/dL — ABNORMAL HIGH (ref 12.0–15.0)
Immature Granulocytes: 1 %
Lymphocytes Relative: 21 %
Lymphs Abs: 2.1 K/uL (ref 0.7–4.0)
MCH: 32.2 pg (ref 26.0–34.0)
MCHC: 34.6 g/dL (ref 30.0–36.0)
MCV: 93.3 fL (ref 80.0–100.0)
Monocytes Absolute: 1 K/uL (ref 0.1–1.0)
Monocytes Relative: 10 %
Neutro Abs: 6.8 K/uL (ref 1.7–7.7)
Neutrophils Relative %: 67 %
Platelets: 445 K/uL — ABNORMAL HIGH (ref 150–400)
RBC: 5.21 MIL/uL — ABNORMAL HIGH (ref 3.87–5.11)
RDW: 14 % (ref 11.5–15.5)
WBC: 10.1 K/uL (ref 4.0–10.5)
nRBC: 0 % (ref 0.0–0.2)

## 2024-05-04 LAB — COMPREHENSIVE METABOLIC PANEL WITH GFR
ALT: 6 U/L (ref 0–44)
AST: 16 U/L (ref 15–41)
Albumin: 4.8 g/dL (ref 3.5–5.0)
Alkaline Phosphatase: 245 U/L — ABNORMAL HIGH (ref 38–126)
Anion gap: 16 — ABNORMAL HIGH (ref 5–15)
BUN: 19 mg/dL (ref 6–20)
CO2: 23 mmol/L (ref 22–32)
Calcium: 9.5 mg/dL (ref 8.9–10.3)
Chloride: 101 mmol/L (ref 98–111)
Creatinine, Ser: 0.75 mg/dL (ref 0.44–1.00)
GFR, Estimated: >60 mL/min (ref >60)
Glucose, Bld: 100 mg/dL — ABNORMAL HIGH (ref 70–99)
Potassium: 3.1 mmol/L — ABNORMAL LOW (ref 3.5–5.1)
Sodium: 141 mmol/L (ref 135–145)
Total Bilirubin: 0.3 mg/dL (ref 0.0–1.2)
Total Protein: 7.4 g/dL (ref 6.5–8.1)

## 2024-05-04 NOTE — Pre-Procedure Instructions (Signed)
 Dr Mavis raker about 3.1 potassium.

## 2024-05-05 NOTE — Pre-Procedure Instructions (Signed)
 Dr Kendell and Dr Mavis aware of K= of 3.1. No orders given at this time.

## 2024-05-06 ENCOUNTER — Other Ambulatory Visit: Payer: Self-pay

## 2024-05-06 ENCOUNTER — Ambulatory Visit (HOSPITAL_COMMUNITY): Admitting: Anesthesiology

## 2024-05-06 ENCOUNTER — Observation Stay (HOSPITAL_COMMUNITY)
Admission: RE | Admit: 2024-05-06 | Discharge: 2024-05-07 | Disposition: A | Discharge status: Home / Self Care | Attending: General Surgery | Admitting: General Surgery

## 2024-05-06 ENCOUNTER — Encounter (HOSPITAL_COMMUNITY): Payer: Self-pay | Admitting: General Surgery

## 2024-05-06 ENCOUNTER — Encounter (HOSPITAL_COMMUNITY): Admission: RE | Disposition: A | Payer: Self-pay | Source: Home / Self Care | Attending: General Surgery

## 2024-05-06 DIAGNOSIS — Z79899 Other long term (current) drug therapy: Secondary | ICD-10-CM | POA: Diagnosis not present

## 2024-05-06 DIAGNOSIS — I10 Essential (primary) hypertension: Secondary | ICD-10-CM | POA: Diagnosis not present

## 2024-05-06 DIAGNOSIS — Z01818 Encounter for other preprocedural examination: Principal | ICD-10-CM

## 2024-05-06 DIAGNOSIS — J45909 Unspecified asthma, uncomplicated: Secondary | ICD-10-CM | POA: Diagnosis not present

## 2024-05-06 DIAGNOSIS — Z96642 Presence of left artificial hip joint: Secondary | ICD-10-CM | POA: Insufficient documentation

## 2024-05-06 DIAGNOSIS — F17210 Nicotine dependence, cigarettes, uncomplicated: Secondary | ICD-10-CM | POA: Diagnosis not present

## 2024-05-06 DIAGNOSIS — D0511 Intraductal carcinoma in situ of right breast: Secondary | ICD-10-CM | POA: Diagnosis present

## 2024-05-06 DIAGNOSIS — Z8541 Personal history of malignant neoplasm of cervix uteri: Secondary | ICD-10-CM | POA: Insufficient documentation

## 2024-05-06 DIAGNOSIS — Z9011 Acquired absence of right breast and nipple: Secondary | ICD-10-CM

## 2024-05-06 HISTORY — PX: MASTECTOMY MODIFIED RADICAL: SHX5962

## 2024-05-06 SURGERY — MASTECTOMY, MODIFIED RADICAL
Anesthesia: General | Site: Breast | Laterality: Right

## 2024-05-06 MED ORDER — POVIDONE-IODINE 10 % EX OINT
TOPICAL_OINTMENT | CUTANEOUS | Status: AC
  Filled 2024-05-06: qty 28.4

## 2024-05-06 MED ORDER — LACTATED RINGERS IV SOLN: INTRAVENOUS | Status: DC

## 2024-05-06 MED ORDER — LAMOTRIGINE 25 MG PO TABS
150.0000 mg | ORAL_TABLET | Freq: Two times a day (BID) | ORAL | Status: DC
  Administered 2024-05-06 – 2024-05-07 (×3): 150 mg via ORAL
  Filled 2024-05-06 (×3): qty 2

## 2024-05-06 MED ORDER — ONDANSETRON HCL 4 MG/2ML IJ SOLN: 4.0000 mg | Freq: Once | INTRAMUSCULAR | Status: DC | PRN

## 2024-05-06 MED ORDER — ORAL CARE MOUTH RINSE: 15.0000 mL | Freq: Once | OROMUCOSAL | Status: AC

## 2024-05-06 MED ORDER — CHLORHEXIDINE GLUCONATE CLOTH 2 % EX PADS
6.0000 | MEDICATED_PAD | Freq: Once | CUTANEOUS | Status: AC
  Administered 2024-05-06: 6 via TOPICAL

## 2024-05-06 MED ORDER — 0.9 % SODIUM CHLORIDE (POUR BTL) OPTIME
TOPICAL | Status: DC | PRN
  Administered 2024-05-06: 1000 mL

## 2024-05-06 MED ORDER — OXYCODONE HCL 5 MG PO TABS: 5.0000 mg | ORAL_TABLET | Freq: Once | ORAL | Status: DC | PRN

## 2024-05-06 MED ORDER — SODIUM CHLORIDE 0.9 % IV SOLN: INTRAVENOUS | Status: DC

## 2024-05-06 MED ORDER — CHLORHEXIDINE GLUCONATE 0.12 % MT SOLN
15.0000 mL | Freq: Once | OROMUCOSAL | Status: AC
Start: 2024-05-06 — End: 2024-05-06
  Administered 2024-05-06: 15 mL via OROMUCOSAL

## 2024-05-06 MED ORDER — ORAL CARE MOUTH RINSE: 15.0000 mL | OROMUCOSAL | Status: DC | PRN

## 2024-05-06 MED ORDER — PROPOFOL 10 MG/ML IV BOLUS
INTRAVENOUS | Status: AC
  Filled 2024-05-06: qty 20

## 2024-05-06 MED ORDER — SIMETHICONE 80 MG PO CHEW: 40.0000 mg | CHEWABLE_TABLET | Freq: Four times a day (QID) | ORAL | Status: DC | PRN

## 2024-05-06 MED ORDER — MECLIZINE HCL 12.5 MG PO TABS
25.0000 mg | ORAL_TABLET | Freq: Three times a day (TID) | ORAL | Status: DC
  Administered 2024-05-06 – 2024-05-07 (×3): 25 mg via ORAL
  Filled 2024-05-06 (×3): qty 2

## 2024-05-06 MED ORDER — PROPOFOL 10 MG/ML IV BOLUS
INTRAVENOUS | Status: DC | PRN
  Administered 2024-05-06: 150 mg via INTRAVENOUS

## 2024-05-06 MED ORDER — MILNACIPRAN HCL 50 MG PO TABS
50.0000 mg | ORAL_TABLET | Freq: Two times a day (BID) | ORAL | Status: DC
  Administered 2024-05-06 – 2024-05-07 (×2): 50 mg via ORAL
  Filled 2024-05-06 (×4): qty 1

## 2024-05-06 MED ORDER — METHOCARBAMOL 500 MG PO TABS
500.0000 mg | ORAL_TABLET | Freq: Three times a day (TID) | ORAL | Status: DC
  Administered 2024-05-06 – 2024-05-07 (×3): 500 mg via ORAL
  Filled 2024-05-06 (×3): qty 1

## 2024-05-06 MED ORDER — PANTOPRAZOLE SODIUM 40 MG PO TBEC
40.0000 mg | DELAYED_RELEASE_TABLET | Freq: Every day | ORAL | Status: DC
Start: 2024-05-07 — End: 2024-05-07

## 2024-05-06 MED ORDER — ACETAMINOPHEN 325 MG PO TABS: 650.0000 mg | ORAL_TABLET | Freq: Four times a day (QID) | ORAL | Status: DC | PRN

## 2024-05-06 MED ORDER — ALBUTEROL SULFATE (2.5 MG/3ML) 0.083% IN NEBU: 2.5000 mg | INHALATION_SOLUTION | RESPIRATORY_TRACT | Status: DC | PRN

## 2024-05-06 MED ORDER — CEFAZOLIN SODIUM-DEXTROSE 2-4 GM/100ML-% IV SOLN
2.0000 g | INTRAVENOUS | Status: AC
  Administered 2024-05-06: 2 g via INTRAVENOUS

## 2024-05-06 MED ORDER — ALBUTEROL SULFATE HFA 108 (90 BASE) MCG/ACT IN AERS
1.0000 | INHALATION_SPRAY | RESPIRATORY_TRACT | Status: DC | PRN
Start: 2024-05-06 — End: 2024-05-06

## 2024-05-06 MED ORDER — OXYCODONE HCL 5 MG PO TABS
5.0000 mg | ORAL_TABLET | ORAL | Status: DC | PRN
  Administered 2024-05-06 – 2024-05-07 (×3): 10 mg via ORAL
  Filled 2024-05-06 (×2): qty 2

## 2024-05-06 MED ORDER — POVIDONE-IODINE 10 % OINT PACKET
TOPICAL_OINTMENT | CUTANEOUS | Status: DC | PRN
  Administered 2024-05-06: 1 via TOPICAL

## 2024-05-06 MED ORDER — LIDOCAINE 2% (20 MG/ML) 5 ML SYRINGE
INTRAMUSCULAR | Status: DC | PRN
  Administered 2024-05-06: 50 mg via INTRAVENOUS

## 2024-05-06 MED ORDER — AMITRIPTYLINE HCL 25 MG PO TABS
50.0000 mg | ORAL_TABLET | Freq: Every day | ORAL | Status: DC
  Administered 2024-05-06: 50 mg via ORAL
  Filled 2024-05-06: qty 2

## 2024-05-06 MED ORDER — OXYCODONE HCL 5 MG/5ML PO SOLN: 5.0000 mg | Freq: Once | ORAL | Status: DC | PRN

## 2024-05-06 MED ORDER — ONDANSETRON HCL 4 MG/2ML IJ SOLN: 4.0000 mg | Freq: Four times a day (QID) | INTRAMUSCULAR | Status: DC | PRN

## 2024-05-06 MED ORDER — ACETAMINOPHEN 650 MG RE SUPP: 650.0000 mg | Freq: Four times a day (QID) | RECTAL | Status: DC | PRN

## 2024-05-06 MED ORDER — FENTANYL CITRATE (PF) 100 MCG/2ML IJ SOLN
INTRAMUSCULAR | Status: AC
  Filled 2024-05-06: qty 2

## 2024-05-06 MED ORDER — DILTIAZEM HCL ER COATED BEADS 180 MG PO CP24
180.0000 mg | ORAL_CAPSULE | Freq: Every day | ORAL | Status: DC
  Administered 2024-05-06 – 2024-05-07 (×2): 180 mg via ORAL
  Filled 2024-05-06 (×2): qty 1

## 2024-05-06 MED ORDER — ENOXAPARIN SODIUM 30 MG/0.3ML IJ SOSY
30.0000 mg | PREFILLED_SYRINGE | INTRAMUSCULAR | Status: DC
  Administered 2024-05-07: 30 mg via SUBCUTANEOUS
  Filled 2024-05-06: qty 0.3

## 2024-05-06 MED ORDER — LEVETIRACETAM 500 MG PO TABS
500.0000 mg | ORAL_TABLET | Freq: Two times a day (BID) | ORAL | Status: DC
  Administered 2024-05-06 – 2024-05-07 (×3): 500 mg via ORAL
  Filled 2024-05-06 (×3): qty 1

## 2024-05-06 MED ORDER — ONDANSETRON HCL 4 MG/2ML IJ SOLN
INTRAMUSCULAR | Status: DC | PRN
  Administered 2024-05-06: 4 mg via INTRAVENOUS

## 2024-05-06 MED ORDER — FENTANYL CITRATE (PF) 100 MCG/2ML IJ SOLN
INTRAMUSCULAR | Status: DC | PRN
  Administered 2024-05-06 (×2): 50 ug via INTRAVENOUS

## 2024-05-06 MED ORDER — BUPIVACAINE HCL (PF) 0.5 % IJ SOLN
INTRAMUSCULAR | Status: AC
  Filled 2024-05-06: qty 30

## 2024-05-06 MED ORDER — DEXAMETHASONE SOD PHOSPHATE PF 10 MG/ML IJ SOLN
INTRAMUSCULAR | Status: DC | PRN
Start: 2024-05-06 — End: 2024-05-06
  Administered 2024-05-06: 6 mg via INTRAVENOUS

## 2024-05-06 MED ORDER — FENTANYL CITRATE (PF) 50 MCG/ML IJ SOSY
25.0000 ug | PREFILLED_SYRINGE | INTRAMUSCULAR | Status: DC | PRN
  Administered 2024-05-06 (×2): 50 ug via INTRAVENOUS
  Filled 2024-05-06 (×2): qty 1

## 2024-05-06 MED ORDER — SUMATRIPTAN SUCCINATE 50 MG PO TABS
100.0000 mg | ORAL_TABLET | ORAL | Status: DC | PRN
Start: 2024-05-06 — End: 2024-05-07

## 2024-05-06 MED ORDER — ALPRAZOLAM 0.25 MG PO TABS: 0.2500 mg | ORAL_TABLET | Freq: Four times a day (QID) | ORAL | Status: DC | PRN

## 2024-05-06 MED ORDER — LINACLOTIDE 145 MCG PO CAPS
145.0000 ug | ORAL_CAPSULE | Freq: Every day | ORAL | Status: DC
  Administered 2024-05-07: 145 ug via ORAL
  Filled 2024-05-06: qty 1

## 2024-05-06 MED ORDER — HYDROMORPHONE HCL 1 MG/ML IJ SOLN
0.5000 mg | INTRAMUSCULAR | Status: DC | PRN
  Administered 2024-05-06 – 2024-05-07 (×2): 0.5 mg via INTRAVENOUS
  Filled 2024-05-06 (×2): qty 0.5

## 2024-05-06 MED ORDER — ONDANSETRON 4 MG PO TBDP: 4.0000 mg | ORAL_TABLET | Freq: Four times a day (QID) | ORAL | Status: DC | PRN

## 2024-05-06 MED ORDER — ATOGEPANT 60 MG PO TABS: 1.0000 | ORAL_TABLET | Freq: Every day | ORAL | Status: DC

## 2024-05-06 MED ORDER — BUPIVACAINE HCL (PF) 0.5 % IJ SOLN
INTRAMUSCULAR | Status: DC | PRN
  Administered 2024-05-06: 23 mL

## 2024-05-06 SURGICAL SUPPLY — 31 items
BINDER BREAST LRG (GAUZE/BANDAGES/DRESSINGS) IMPLANT
CHLORAPREP W/TINT 26 (MISCELLANEOUS) ×1 IMPLANT
CLOTH BEACON ORANGE TIMEOUT ST (SAFETY) ×1 IMPLANT
COVER LIGHT HANDLE STERIS (MISCELLANEOUS) ×2 IMPLANT
DISSECTOR SURG LIGASURE 21 (MISCELLANEOUS) ×1 IMPLANT
DRAPE HALF SHEET 40X57 (DRAPES) ×2 IMPLANT
ELECTRODE REM PT RTRN 9FT ADLT (ELECTROSURGICAL) ×1 IMPLANT
EVACUATOR DRAINAGE 10X20 100CC (DRAIN) ×1 IMPLANT
GAUZE SPONGE 4X4 12PLY STRL (GAUZE/BANDAGES/DRESSINGS) ×1 IMPLANT
GLOVE BIOGEL PI IND STRL 7.0 (GLOVE) ×2 IMPLANT
GLOVE SURG SS PI 7.5 STRL IVOR (GLOVE) ×2 IMPLANT
GOWN STRL REUS W/TWL LRG LVL3 (GOWN DISPOSABLE) ×3 IMPLANT
KIT TURNOVER KIT A (KITS) ×1 IMPLANT
MANIFOLD NEPTUNE II (INSTRUMENTS) ×1 IMPLANT
NDL 22X1.5 STRL (OR ONLY) (MISCELLANEOUS) ×1 IMPLANT
NEEDLE 22X1.5 STRL (OR ONLY) (MISCELLANEOUS) ×1 IMPLANT
PACK MINOR (CUSTOM PROCEDURE TRAY) ×1 IMPLANT
PAD ABD 8X10 STRL (GAUZE/BANDAGES/DRESSINGS) ×1 IMPLANT
PAD ARMBOARD POSITIONER FOAM (MISCELLANEOUS) ×1 IMPLANT
POSITIONER HEAD 8X9X4 ADT (SOFTGOODS) ×1 IMPLANT
SET BASIN LINEN APH (SET/KITS/TRAYS/PACK) ×1 IMPLANT
SOLN 0.9% NACL POUR BTL 1000ML (IV SOLUTION) ×1 IMPLANT
SPONGE DRAIN TRACH 4X4 STRL 2S (GAUZE/BANDAGES/DRESSINGS) ×1 IMPLANT
SPONGE INTESTINAL PEANUT (DISPOSABLE) ×1 IMPLANT
SPONGE T-LAP 18X18 ~~LOC~~+RFID (SPONGE) ×2 IMPLANT
STAPLER VISISTAT (STAPLE) ×1 IMPLANT
SUT 3-0 BLK 1X30 PSL (SUTURE) ×1 IMPLANT
SUT SILK 2 0 SH (SUTURE) ×1 IMPLANT
SUT VIC AB 2-0 CT1 TAPERPNT 27 (SUTURE) ×4 IMPLANT
SYR 30ML LL (SYRINGE) ×2 IMPLANT
SYR BULB IRRIG 60ML STRL (SYRINGE) ×1 IMPLANT

## 2024-05-06 NOTE — Interval H&P Note (Signed)
 History and Physical Interval Note:  05/06/2024 11:24 AM  Sharon Welch  has presented today for surgery, with the diagnosis of DUCTAL CARCINOMA IN SITU, RIGHT BREAST.  The various methods of treatment have been discussed with the patient and family. After consideration of risks, benefits and other options for treatment, the patient has consented to  Procedure(s): MASTECTOMY, MODIFIED RADICAL (Right) as a surgical intervention.  The patient's history has been reviewed, patient examined, no change in status, stable for surgery.  I have reviewed the patient's chart and labs.  Questions were answered to the patient's satisfaction.     [name]

## 2024-05-06 NOTE — Anesthesia Preprocedure Evaluation (Signed)
 Anesthesia Evaluation  Patient identified by MRN, date of birth, ID band Patient awake    Reviewed: Allergy & Precautions, H&P , NPO status , Patient's Chart, lab work & pertinent test results, reviewed documented beta blocker date and time   Airway Mallampati: II  TM Distance: >3 FB Neck ROM: full    Dental no notable dental hx.    Pulmonary asthma , Current Smoker and Patient abstained from smoking.   Pulmonary exam normal breath sounds clear to auscultation       Cardiovascular Exercise Tolerance: Good hypertension, + dysrhythmias  Rhythm:regular Rate:Normal     Neuro/Psych  Headaches, Seizures -,   Neuromuscular disease  negative psych ROS   GI/Hepatic Neg liver ROS, PUD,GERD  ,,  Endo/Other  negative endocrine ROS    Renal/GU negative Renal ROS  negative genitourinary   Musculoskeletal   Abdominal   Peds  Hematology negative hematology ROS (+)   Anesthesia Other Findings   Reproductive/Obstetrics negative OB ROS                              Anesthesia Physical Anesthesia Plan  ASA: 2  Anesthesia Plan: General and General LMA   Post-op Pain Management:    Induction:   PONV Risk Score and Plan: Ondansetron   Airway Management Planned:   Additional Equipment:   Intra-op Plan:   Post-operative Plan:   Informed Consent: I have reviewed the patients History and Physical, chart, labs and discussed the procedure including the risks, benefits and alternatives for the proposed anesthesia with the patient or authorized representative who has indicated his/her understanding and acceptance.     Dental Advisory Given  Plan Discussed with: CRNA  Anesthesia Plan Comments:         Anesthesia Quick Evaluation

## 2024-05-06 NOTE — Anesthesia Procedure Notes (Signed)
 Procedure Name: LMA Insertion Date/Time: 05/06/2024 12:00 PM  Performed by: Toribio Darice BRAVO, CRNAPre-anesthesia Checklist: Patient identified, Patient being monitored, Emergency Drugs available, Timeout performed and Suction available Patient Re-evaluated:Patient Re-evaluated prior to induction Oxygen  Delivery Method: Circle System Utilized Preoxygenation: Pre-oxygenation with 100% oxygen  Induction Type: IV induction Ventilation: Mask ventilation without difficulty LMA: LMA inserted LMA Size: 3.0 Number of attempts: 1 Placement Confirmation: positive ETCO2 and breath sounds checked- equal and bilateral

## 2024-05-06 NOTE — Op Note (Signed)
 Patient:  Sharon Welch  DOB:  [birth year]  MRN:  [id number]   Preop Diagnosis: Ductal carcinoma in situ of right breast  Postop Diagnosis: Same  Procedure: Right modified radical mastectomy  Surgeon: [name], MD  Anes: General endotracheal  Indications: Patient is a 53 year old white female who presents for right modified radical mastectomy for ductal carcinoma in situ of the right breast.  The risks and benefits of the procedure including bleeding, infection, nerve injury, and the possibility of residual cancer were fully explained to the patient, who gave informed consent.  Procedure note: The patient was placed in the supine position.  After induction of general endotracheal anesthesia, the right upper chest and axilla were prepped and draped using the usual sterile technique with ChloraPrep.  Surgical site confirmation was performed.  An elliptical incision was made medial to lateral around the nipple.  A superior flap was formed to the clavicle and an inferior flap formed to the chest wall.  The breast was then removed from the pectoralis major muscle using Bovie electrocautery.  A suture was placed superiorly for orientation purposes.  The specimen was sent to pathology for further examination.  On exploration of the right axilla, 2 lymph nodes were identified.  A LigaSure was used to help with the dissection.  Care was taken to avoid the thoracodorsal artery vein and nerve.  The intercostal brachial nerve was also identified and saved.  The lymph nodes were sent to pathology further examination.  The wound was irrigated with normal saline.  A #10 flat Jackson-Pratt drain was placed along the flap and brought out through a separate stab wound inferior to the incision line.  It was secured at the skin level using a 3-0 nylon suture.  The subcutaneous layer was reapproximated using 2-0 Vicryl interrupted sutures.  0.5% Sensorcaine  was instilled into the surrounding wound.  The skin was closed  using staples.  Betadine  ointment and dry sterile dressing were applied.  All tape and needle counts were correct at the end of the procedure.  The patient was extubated in the operating room and transferred to PACU in stable condition.  Complications: None  EBL: 20 cc  Specimen: Right breast, right axillary lymph nodes  Drains: Jackson-Pratt drain to right mastectomy flap

## 2024-05-06 NOTE — Transfer of Care (Signed)
 Immediate Anesthesia Transfer of Care Note  Patient: Sharon Welch  Procedure(s) Performed: MASTECTOMY, MODIFIED RADICAL (Right: Breast)  Patient Location: PACU  Anesthesia Type:General  Level of Consciousness: awake  Airway & Oxygen  Therapy: Patient Spontanous Breathing  Post-op Assessment: Report given to RN  Post vital signs: Reviewed and stable  Last Vitals:  Vitals Value Taken Time  BP 140/85 05/06/24 13:00  Temp    Pulse 88 05/06/24 13:01  Resp    SpO2 97 % 05/06/24 13:01  Vitals shown include unfiled device data.  Last Pain:  Vitals:   05/06/24 1103  PainSc: 5       Patients Stated Pain Goal: 4 (05/06/24 1103)  Complications: No notable events documented.

## 2024-05-07 ENCOUNTER — Encounter (HOSPITAL_COMMUNITY): Payer: Self-pay | Admitting: General Surgery

## 2024-05-07 DIAGNOSIS — D0511 Intraductal carcinoma in situ of right breast: Secondary | ICD-10-CM | POA: Diagnosis not present

## 2024-05-07 LAB — BASIC METABOLIC PANEL WITH GFR
Anion gap: 12 (ref 5–15)
BUN: 8 mg/dL (ref 6–20)
CO2: 26 mmol/L (ref 22–32)
Calcium: 8.1 mg/dL — ABNORMAL LOW (ref 8.9–10.3)
Chloride: 102 mmol/L (ref 98–111)
Creatinine, Ser: 0.56 mg/dL (ref 0.44–1.00)
GFR, Estimated: >60 mL/min (ref >60)
Glucose, Bld: 123 mg/dL — ABNORMAL HIGH (ref 70–99)
Potassium: 2.8 mmol/L — ABNORMAL LOW (ref 3.5–5.1)
Sodium: 140 mmol/L (ref 135–145)

## 2024-05-07 LAB — CBC
HCT: 36.2 % (ref 36.0–46.0)
Hemoglobin: 12.6 g/dL (ref 12.0–15.0)
MCH: 32.4 pg (ref 26.0–34.0)
MCHC: 34.8 g/dL (ref 30.0–36.0)
MCV: 93.1 fL (ref 80.0–100.0)
Platelets: 297 K/uL (ref 150–400)
RBC: 3.89 MIL/uL (ref 3.87–5.11)
RDW: 13.8 % (ref 11.5–15.5)
WBC: 12.2 K/uL — ABNORMAL HIGH (ref 4.0–10.5)
nRBC: 0 % (ref 0.0–0.2)

## 2024-05-07 MED ORDER — ALPRAZOLAM 0.25 MG PO TABS: 0.2500 mg | ORAL_TABLET | Freq: Four times a day (QID) | ORAL | 0 refills | Status: AC | PRN

## 2024-05-07 MED ORDER — HYDROCODONE-ACETAMINOPHEN 10-325 MG PO TABS: 1.0000 | ORAL_TABLET | Freq: Four times a day (QID) | ORAL | 0 refills | Status: DC | PRN

## 2024-05-07 NOTE — TOC CM/SW Note (Signed)
 Transition of Care [facility]) - Inpatient Brief Assessment   Patient Details  Name: Sharon Welch MRN: [id number] Date of Birth: [birth year]  Transition of Care [facility]) CM/SW Contact:    Lucie Lunger, LCSWA Phone Number: 05/07/2024, 8:22 AM   Clinical Narrative: Transition of Care Department [facility] Welch) has reviewed patient and no TOC needs have been identified at this time. We will continue to monitor patient advancement through interdiciplinary progression rounds. If new patient transition needs arise, please place a TOC consult.  Transition of Care Asessment: Insurance and Status: Insurance coverage has been reviewed Patient has primary care physician: Yes Home environment has been reviewed: From home Prior level of function:: Independent Prior/Current Home Services: No current home services Social Drivers of Health Review: SDOH reviewed no interventions necessary Readmission risk has been reviewed: Yes Transition of care needs: no transition of care needs at this time

## 2024-05-07 NOTE — Anesthesia Postprocedure Evaluation (Signed)
 Anesthesia Post Note  Patient: Sharon Welch  Procedure(s) Performed: MASTECTOMY, MODIFIED RADICAL (Right: Breast)  Patient location during evaluation: Phase II Anesthesia Type: General Level of consciousness: awake Pain management: pain level controlled Vital Signs Assessment: post-procedure vital signs reviewed and stable Respiratory status: spontaneous breathing and respiratory function stable Cardiovascular status: blood pressure returned to baseline and stable Postop Assessment: no headache and no apparent nausea or vomiting Anesthetic complications: no Comments: Late entry   No notable events documented.   Last Vitals:  Vitals:   05/07/24 0419 05/07/24 0855  BP: 133/66 (!) 140/73  Pulse: 82 84  Resp: 16 18  Temp: 36.9 C   SpO2: 97%     Last Pain:  Vitals:   05/07/24 0854  TempSrc:   PainSc: 6                  [name]

## 2024-05-07 NOTE — Discharge Summary (Signed)
 Physician Discharge Summary  Patient ID: Sharon Welch MRN: [id number] DOB/AGE: [birth year] 53 y.o.  Admit date: 05/06/2024 Discharge date: 05/07/2024  Admission Diagnoses: Ductal carcinoma in situ, right breast  Discharge Diagnoses:  Principal Problem:   S/P mastectomy, right Active Problems:   Neoplasm of right breast, primary tumor staging category Tis: ductal carcinoma in situ (DCIS) Anxiety, chronic pain, fibromyalgia, history of migraine headaches  Discharged Condition: good  Hospital Course: Patient is a 53 year old white female recently diagnosed with ductal carcinoma in situ of the right breast who underwent a right modified radical mastectomy on 05/06/2024.  She tolerated the surgery well.  Her postoperative course has been unremarkable.  Her diet was advanced without difficulty.  She is being discharged home on 05/07/2024 in good and improving condition.  Final pathology is pending.  Treatments: surgery: Right modified radical mastectomy on 05/06/2024  Discharge Exam: Blood pressure (!) 140/73, pulse 84, temperature 98.4 F (36.9 C), temperature source Oral, resp. rate 18, height 5' (1.524 m), weight 44 kg, SpO2 97%. General appearance: alert, cooperative, and no distress Resp: clear to auscultation bilaterally Breasts: Right mastectomy scar healing well.  No hematoma.  Sanguinous drainage in the JP drain. Cardio: regular rate and rhythm, S1, S2 normal, no murmur, click, rub or gallop  Disposition: Discharge disposition: 01-Home or Self Care       Discharge Instructions     Diet - low sodium heart healthy   Complete by: As directed    Increase activity slowly   Complete by: As directed       Allergies as of 05/07/2024       Reactions   Ultram [tramadol] Other (See Comments)   Possible seizures   Aleve [naproxen] Other (See Comments)   Ulcers   Blueberry [vaccinium Angustifolium] Swelling   Coconut (cocos Nucifera) Swelling   Depakote  [divalproex  Sodium]  Other (See Comments)   Insomnia . (Changed from divalproex  to Lamictal  due to side effects)   Nsaids Other (See Comments)   History of bleeding ulcers.   Pineapple Other (See Comments)   Migraine        Medication List     TAKE these medications    acetaminophen  500 MG tablet Commonly known as: TYLENOL  Take 1,000 mg by mouth every 6 (six) hours as needed for mild pain (pain score 1-3) or moderate pain (pain score 4-6).   albuterol  108 (90 Base) MCG/ACT inhaler Commonly known as: VENTOLIN  HFA Inhale 1-2 puffs into the lungs every 4 (four) hours as needed for wheezing or shortness of breath.   ALPRAZolam  0.25 MG tablet Commonly known as: XANAX  Take 1-2 tablets (0.25-0.5 mg total) by mouth 4 (four) times daily as needed for anxiety or sleep.   amitriptyline  25 MG tablet Commonly known as: ELAVIL  Take 50 mg by mouth at bedtime.   dexlansoprazole 60 MG capsule Commonly known as: DEXILANT Take 60 mg by mouth every morning.   diltiazem  180 MG 24 hr capsule Commonly known as: CARDIZEM  CD TAKE ONE CAPSULE BY MOUTH EVERY DAY   furosemide  20 MG tablet Commonly known as: LASIX  Take 1 tablet (20 mg total) by mouth daily as needed (increasing lower extremity edema).   HYDROcodone -acetaminophen  10-325 MG tablet Commonly known as: NORCO Take 1 tablet by mouth every 6 (six) hours as needed for severe pain (pain score 7-10). What changed:  when to take this reasons to take this   lamoTRIgine  150 MG tablet Commonly known as: LaMICtal  Take 1 tablet (150 mg total) by mouth 2 (  two) times daily.   levETIRAcetam  500 MG tablet Commonly known as: Keppra  Take 1 tablet (500 mg total) by mouth 2 (two) times daily.   Linzess  145 MCG Caps capsule Generic drug: linaclotide  Take 145 mcg by mouth daily before breakfast.   meclizine  25 MG tablet Commonly known as: ANTIVERT  Take 1 tablet (25 mg total) by mouth 3 (three) times daily.   methocarbamol  500 MG tablet Commonly known as:  ROBAXIN  Take 500 mg by mouth 3 (three) times daily.   promethazine  25 MG tablet Commonly known as: PHENERGAN  Take 25 mg by mouth every 6 (six) hours as needed for nausea or vomiting.   Qulipta  60 MG Tabs Generic drug: Atogepant  Take 1 tablet (60 mg total) by mouth daily.   rizatriptan 10 MG tablet Commonly known as: MAXALT Take 10 mg by mouth See admin instructions. 10 mg as needed for migraine, may repeat 10 mg in 2 hours if headache persists or recurs.   Savella  50 MG Tabs tablet Generic drug: Milnacipran  Take 50 mg by mouth 2 (two) times daily.        Follow-up Information     Mavis Anes, MD. Schedule an appointment as soon as possible for a visit on 05/11/2024.   Specialty: General Surgery Contact information: [address] [place] [address] [phone]                 Signed: Anes Mavis 05/07/2024, 9:58 AM

## 2024-05-07 NOTE — Progress Notes (Addendum)
 1 Day Post-Op  Subjective: Sharon Welch is a 53 YO female on post op day 1 from a modified radical mastectomy. She currently endorses poor sleep, dull achy pain around the incision site, burning pain under the right axilla when she moves her arm, and a sore throat. In regards to the poor sleep, she states that she was not able to sleep at all last night due to the pain. Last night around 7:30 pm, she had an episode of nausea that was resolved with Ginger ale (she did not want Zofran ). She has been able to ambulate, drink, and go to the bathroom. She denies fevers, chills or SOB.    Objective: Vital signs in last 24 hours: Temp:  [97.6 F (36.4 C)-98.5 F (36.9 C)] 98.4 F (36.9 C) (11/14 0419) Pulse Rate:  [81-91] 82 (11/14 0419) Resp:  [9-17] 16 (11/14 0419) BP: (132-157)/(66-90) 133/66 (11/14 0419) SpO2:  [94 %-100 %] 97 % (11/14 0419) Weight:  [44 kg] 44 kg (11/13 1103) Last BM Date : 05/05/24  Intake/Output from previous day: 11/13 0701 - 11/14 0700 In: 1630 [P.O.:930; I.V.:700] Out: 25 [Drains:5; Blood:20] Intake/Output this shift: No intake/output data recorded.  General appearance: alert and cooperative Head: Normocephalic, without obvious abnormality, atraumatic Eyes: conjunctivae/corneas clear. PERRL, EOM's intact. Fundi benign. Resp: clear to auscultation bilaterally Breasts: Right breast surgical incision is healing well. Slight erythema but no discharge.  Cardio: regular rate and rhythm, S1, S2 normal, no murmur, click, rub or gallop Extremities: extremities normal, atraumatic, no cyanosis or edema Pulses: 2+ and symmetric Skin: Skin color, texture, turgor normal. No rashes or lesions Neurologic: Grossly normal   JP drain had 25 mL of red fluid.   Lab Results:  Recent Labs    05/04/24 1243 05/07/24 0358  WBC 10.1 12.2*  HGB 16.8* 12.6  HCT 48.6* 36.2  PLT 445* 297   BMET Recent Labs    05/04/24 1243 05/07/24 0358  NA 141 140  K 3.1* 2.8*  CL 101 102  CO2  23 26  GLUCOSE 100* 123*  BUN 19 8  CREATININE 0.75 0.56  CALCIUM  9.5 8.1*   PT/INR No results for input(s): LABPROT, INR in the last 72 hours.  Studies/Results: No results found.  Anti-infectives: Anti-infectives (From admission, onward)    Start     Dose/Rate Route Frequency Ordered Stop   05/06/24 1200  ceFAZolin  (ANCEF ) IVPB 2g/100 mL premix        2 g 200 mL/hr over 30 Minutes Intravenous On call to O.R. 05/06/24 1044 05/06/24 1241       Assessment/Plan: s/p Procedure(s): MASTECTOMY, MODIFIED RADICAL 1. Insomnia, pain at incision site, burning pain at axilla, and sore throat - These are most likely post-surgical symptoms that will resolve over time - Pain is managed PRN with tylenol , dilaudid , and oxycodone . Patient will be discharged with oxycodone   - Insomnia will most likely improve as the pain dies down  - Sore throat is most likely due to irritation from intubation  2. Acute leukocytosis - Most likely transient due to post-surgical inflammation - Informed patient to monitor for any signs of infection   3. Hypokalemia and hypocalcemia - Due to either post-surgical hypomagnesia or hypoalbuminemia or dilutional effect   Patient is clear for discharge today and has an office appointment next Tuesday the 18th with Dr. Mavis.    LOS: 0 days   Sharon Welch PARAS [name], Medical Student 05/07/2024 9:51 AM

## 2024-05-10 ENCOUNTER — Ambulatory Visit (INDEPENDENT_AMBULATORY_CARE_PROVIDER_SITE_OTHER): Payer: Self-pay | Admitting: Family Medicine

## 2024-05-10 ENCOUNTER — Encounter: Payer: Self-pay | Admitting: Family Medicine

## 2024-05-10 VITALS — BP 122/78 | HR 107 | Ht 60.0 in | Wt 94.0 lb

## 2024-05-10 DIAGNOSIS — E782 Mixed hyperlipidemia: Secondary | ICD-10-CM

## 2024-05-10 DIAGNOSIS — Z720 Tobacco use: Secondary | ICD-10-CM

## 2024-05-10 DIAGNOSIS — E038 Other specified hypothyroidism: Secondary | ICD-10-CM

## 2024-05-10 DIAGNOSIS — R7301 Impaired fasting glucose: Secondary | ICD-10-CM

## 2024-05-10 DIAGNOSIS — E559 Vitamin D deficiency, unspecified: Secondary | ICD-10-CM | POA: Diagnosis not present

## 2024-05-10 DIAGNOSIS — G8929 Other chronic pain: Secondary | ICD-10-CM

## 2024-05-10 LAB — SURGICAL PATHOLOGY

## 2024-05-10 NOTE — Assessment & Plan Note (Signed)
 The patient has multiple chronic conditions contributing to longstanding pain. She reports that her previous provider has now retired and is requesting a referral to the pain clinic for continuation of her narcotic prescriptions. PDMP was reviewed, and the patient is currently prescribed a narcotic, a benzodiazepine for sleep, and amitriptyline  for sleep.  A referral to Pain Management has been placed. The patient will be required to sign a pain contract and complete a urine drug screen before any refills can be issued. Discussed the risks and interactions associated with taking narcotics and benzodiazepines concurrently, including increased risk of respiratory depression, sedation, and overdose. The patient verbalized understanding.  She reports she currently has 30 pills remaining of her pain medication and 8 Xanax  tablets left.

## 2024-05-10 NOTE — Assessment & Plan Note (Signed)
Smokes about 1/2 pack/day  Asked about quitting: confirms that she currently smokes cigarettes Advise to quit smoking: Educated about QUITTING to reduce the risk of cancer, cardio and cerebrovascular disease. Assess willingness: Unwilling to quit at this time, but is working on cutting back. Assist with counseling and pharmacotherapy: Counseled for 5 minutes and literature provided. Arrange for follow up: follow up in 3 months and continue to offer help. 

## 2024-05-10 NOTE — Patient Instructions (Addendum)
 I appreciate the opportunity to provide care to you today!    Follow up:  5 months  Fasting Labs: please stop by the lab today/ during the week to get your blood drawn  (TSH, Lipid profile, HgA1c, Vit D)  Chronic pain Referral has been placed to pain clinic  For a Healthier YOU, I Recommend: Reducing your intake of sugar, sodium, carbohydrates, and saturated fats. Increasing your fiber intake by incorporating more whole grains, fruits, and vegetables into your meals. Setting healthy goals with a focus on lowering your consumption of carbs, sugar, and unhealthy fats. Adding variety to your diet by including a wide range of fruits and vegetables. Cutting back on soda and limiting processed foods as much as possible. Staying active: In addition to taking your weight loss medication, aim for at least 150 minutes of moderate-intensity physical activity each week for optimal results.  Please follow up if your symptoms worsen or fail to improve.   Referrals today-  Pain clinic    Please continue to a heart-healthy diet and increase your physical activities. Try to exercise for at least five days a week.    It was a pleasure to see you and I look forward to continuing to work together on your health and well-being. Please do not hesitate to call the office if you need care or have questions about your care.  In case of emergency, please visit the Emergency Department for urgent care, or contact our clinic at [phone] to schedule an appointment. We're here to help you!   Have a wonderful day and week. With Gratitude, Meade JENEANE Gerlach MSN, FNP-BC, PMHNP-BC

## 2024-05-10 NOTE — Progress Notes (Signed)
 New Patient Office Visit  Subjective:  Patient ID: Sharon Welch, female    DOB: 1971/04/27  Age: 53 y.o. MRN: [id number]  CC:  Chief Complaint  Patient presents with   Establish Care   Referral    Referral to pain clinic    HPI [name] is a 53 y.o. female with past medical history of  Migraines, seizure disorder, degenerative disc disease, tobacco use, chronic pain presents for establishing care.  Past Medical History:  Diagnosis Date   Asthma    seasonal   Bruising, spontaneous 12/08/2013   Cancer (HCC)    cervical cancer - had hysterectomy   Chronic back pain    Chronic back pain    DDD (degenerative disc disease), lumbar    DDD (degenerative disc disease), lumbar    Dysrhythmia    tachycardia   Fibromyalgia    Headache(784.0)    migraines   History of kidney stones    Ovarian cyst    Partial tear subscapularis tendon 12/27/2011   Scoliosis    Seizures (HCC)    started 9/12-Dx 15 yrs ago-unknow origin and on meds    Past Surgical History:  Procedure Laterality Date   ABDOMINAL HYSTERECTOMY  2012   BACK SURGERY  1989   scoliosis throsic-rods   BREAST BIOPSY Right 02/25/2024   MM RT BREAST BX W LOC DEV 1ST LESION IMAGE BX SPEC STEREO GUIDE 02/25/2024 [facility]   BREAST BIOPSY Right 02/25/2024   MM RT BREAST BX W LOC DEV EA AD LESION IMG BX SPEC STEREO GUIDE 02/25/2024 [facility]   CESAREAN SECTION     CHOLECYSTECTOMY N/A 11/17/2012   Procedure: LAPAROSCOPIC CHOLECYSTECTOMY WITH INTRAOPERATIVE CHOLANGIOGRAM;  Surgeon: Lynda Leos, MD;  Location: [facility];  Service: General;  Laterality: N/A;   COLONOSCOPY WITH PROPOFOL  N/A 04/21/2020   Procedure: COLONOSCOPY WITH PROPOFOL ;  Surgeon: Rollin Dover, MD;  Location: [facility];  Service: Endoscopy;  Laterality: N/A;   ENDOSCOPIC RETROGRADE CHOLANGIOPANCREATOGRAPHY (ERCP) WITH PROPOFOL  N/A 06/11/2019   Procedure: ENDOSCOPIC RETROGRADE CHOLANGIOPANCREATOGRAPHY (ERCP) WITH PROPOFOL ;  Surgeon: Rollin Dover,  MD;  Location: [facility];  Service: Endoscopy;  Laterality: N/A;   ERCP N/A 05/07/2013   Procedure: ENDOSCOPIC RETROGRADE CHOLANGIOPANCREATOGRAPHY (ERCP);  Surgeon: Dover JONETTA Rollin, MD;  Location: [facility];  Service: Endoscopy;  Laterality: N/A;  start ercp first, if canulation fails switch to EUS    ESOPHAGOGASTRODUODENOSCOPY (EGD) WITH PROPOFOL  N/A 05/15/2019   Procedure: ESOPHAGOGASTRODUODENOSCOPY (EGD) WITH PROPOFOL ;  Surgeon: Harvey Margo CROME, MD;  Location[facility];  Service: Endoscopy;  Laterality: N/A;   ESOPHAGOGASTRODUODENOSCOPY (EGD) WITH PROPOFOL  N/A 03/03/2020   Procedure: ESOPHAGOGASTRODUODENOSCOPY (EGD) WITH PROPOFOL ;  Surgeon: Rollin Dover, MD;  Location: [facility];  Service: Endoscopy;  Laterality: N/A;   ESOPHAGOGASTRODUODENOSCOPY (EGD) WITH PROPOFOL  N/A 04/21/2020   Procedure: ESOPHAGOGASTRODUODENOSCOPY (EGD) WITH PROPOFOL ;  Surgeon: Rollin Dover, MD;  [facility];  Service: Endoscopy;  Laterality: N/A;   EUS N/A 11/03/2012   Procedure: UPPER ENDOSCOPIC ULTRASOUND (EUS) LINEAR;  Surgeon: Dover JONETTA Rollin, MD;  Location: [facility];  Service: Endoscopy;  Laterality: N/A;   left arm     MASTECTOMY MODIFIED RADICAL Right 05/06/2024   Procedure: MASTECTOMY, MODIFIED RADICAL;  Surgeon: Mavis Anes, MD;  Location: [facility];  Service: General;  Laterality: Right;   NECK SURGERY     POLYPECTOMY  04/21/2020   Procedure: POLYPECTOMY;  Surgeon: Rollin Dover, MD;  Location[facility];  Service: Endoscopy;;   REMOVAL OF STONES  06/11/2019   Procedure: REMOVAL  OF STONES;  Surgeon: Rollin Dover, MD;  Location: [facility];  Service: Endoscopy;;   TOOTH EXTRACTION N/A 05/24/2022   Procedure: DENTAL RESTORATION/EXTRACTIONS;  Surgeon: Sheryle Hamilton, DMD;  Location: [facility];  Service: Oral Surgery;  Laterality: N/A;   TOTAL HIP ARTHROPLASTY Left 11/17/2022   Procedure: TOTAL HIP ARTHROPLASTY ANTERIOR APPROACH;  Surgeon: Beverley Evalene BIRCH, MD;  Location: [facility];   Service: Orthopedics;  Laterality: Left;   TOTAL SHOULDER ARTHROPLASTY Left 11/04/2023   Procedure: ARTHROPLASTY, SHOULDER, TOTAL;  Surgeon: Josefina Chew, MD;  Location: [facility];  Service: Orthopedics;  Laterality: Left;   TUBAL LIGATION     UPPER ESOPHAGEAL ENDOSCOPIC ULTRASOUND (EUS) N/A 06/11/2019   Procedure: UPPER ESOPHAGEAL ENDOSCOPIC ULTRASOUND (EUS);  Surgeon: Rollin Dover, MD;  Location: [facility];  Service: Endoscopy;  Laterality: N/A;    Family History  Problem Relation Age of Onset   Hypertension Mother    Hypertension Father    Cancer Father        prostate   Heart disease Father    Stroke Brother    Liver cancer Brother    Hypertension Brother    Breast cancer Neg Hx     Social History   Socioeconomic History   Marital status: Married    Spouse name: Not on file   Number of children: Not on file   Years of education: Not on file   Highest education level: 12th grade  Occupational History   Not on file  Tobacco Use   Smoking status: Every Day    Current packs/day: 0.50    Average packs/day: 0.5 packs/day for 20.0 years (10.0 ttl pk-yrs)    Types: Cigarettes   Smokeless tobacco: Never  Vaping Use   Vaping status: Never Used  Substance and Sexual Activity   Alcohol use: Not Currently   Drug use: No   Sexual activity: Not on file  Other Topics Concern   Not on file  Social History Narrative   Live with husband 2 dogs and 4 cats   Right handed   Caffeine- 24oz-36oz daily   Social Drivers of Health   Financial Resource Strain: Low Risk  (05/07/2024)   Overall Financial Resource Strain (CARDIA)    Difficulty of Paying Living Expenses: Not very hard  Food Insecurity: No Food Insecurity (05/07/2024)   Hunger Vital Sign    Worried About Running Out of Food in the Last Year: Never true    Ran Out of Food in the Last Year: Never true  Transportation Needs: No Transportation Needs (05/07/2024)   PRAPARE - Administrator, Civil Service  (Medical): No    Lack of Transportation (Non-Medical): No  Physical Activity: Insufficiently Active (05/07/2024)   Exercise Vital Sign    Days of Exercise per Week: 1 day    Minutes of Exercise per Session: 10 min  Stress: Stress Concern Present (05/07/2024)   [employer] of Occupational Health - Occupational Stress Questionnaire    Feeling of Stress: To some extent  Social Connections: Socially Isolated (05/07/2024)   Social Connection and Isolation Panel    Frequency of Communication with Friends and Family: Never    Frequency of Social Gatherings with Friends and Family: Once a week    Attends Religious Services: Never    Database Administrator or Organizations: No    Attends Banker Meetings: Not on file    Marital Status: Married  Intimate Partner Violence: Not At Risk (05/06/2024)   Humiliation, Afraid, Rape,  and Kick questionnaire    Fear of Current or Ex-Partner: No    Emotionally Abused: No    Physically Abused: No    Sexually Abused: No    ROS Review of Systems  Constitutional:  Negative for chills and fever.  Eyes:  Negative for visual disturbance.  Respiratory:  Negative for chest tightness and shortness of breath.   Neurological:  Negative for dizziness and headaches.  Psychiatric/Behavioral:  Positive for sleep disturbance.     Objective:   Today's Vitals: BP 122/78   Pulse (!) 107   Ht 5' (1.524 m)   Wt 94 lb (42.6 kg)   SpO2 100%   BMI 18.36 kg/m   Physical Exam HENT:     Head: Normocephalic.     Mouth/Throat:     Mouth: Mucous membranes are moist.  Cardiovascular:     Rate and Rhythm: Normal rate.     Heart sounds: Normal heart sounds.  Pulmonary:     Effort: Pulmonary effort is normal.     Breath sounds: Normal breath sounds.  Neurological:     Mental Status: She is alert.      Assessment & Plan:   Other chronic pain Assessment & Plan: The patient has multiple chronic conditions contributing to longstanding pain. She  reports that her previous provider has now retired and is requesting a referral to the pain clinic for continuation of her narcotic prescriptions. PDMP was reviewed, and the patient is currently prescribed a narcotic, a benzodiazepine for sleep, and amitriptyline  for sleep.  A referral to Pain Management has been placed. The patient will be required to sign a pain contract and complete a urine drug screen before any refills can be issued. Discussed the risks and interactions associated with taking narcotics and benzodiazepines concurrently, including increased risk of respiratory depression, sedation, and overdose. The patient verbalized understanding.  She reports she currently has 30 pills remaining of her pain medication and 8 Xanax  tablets left.    Orders: -     Ambulatory referral to Pain Clinic -     ToxASSURE Select 13 (MW), Urine  Tobacco use Assessment & Plan: Smokes about 1/2 pack/day  Asked about quitting: confirms that she currently smokes cigarettes Advise to quit smoking: Educated about QUITTING to reduce the risk of cancer, cardio and cerebrovascular disease. Assess willingness: Unwilling to quit at this time, but is working on cutting back. Assist with counseling and pharmacotherapy: Counseled for 5 minutes and literature provided. Arrange for follow up: follow up in 3 months and continue to offer help.    IFG (impaired fasting glucose) -     Hemoglobin A1c  Vitamin D deficiency -     VITAMIN D 25 Hydroxy (Vit-D Deficiency, Fractures)  TSH (thyroid -stimulating hormone deficiency) -     TSH + free T4  Mixed hyperlipidemia -     Lipid panel   Note: This chart has been completed using Engineer, Civil (consulting) software, and while attempts have been made to ensure accuracy, certain words and phrases may not be transcribed as intended.    Follow-up: Return in about 5 months (around 10/08/2024).   Gentle Hoge  Z Bacchus, FNP

## 2024-05-11 ENCOUNTER — Encounter: Payer: Self-pay | Admitting: General Surgery

## 2024-05-11 ENCOUNTER — Ambulatory Visit (INDEPENDENT_AMBULATORY_CARE_PROVIDER_SITE_OTHER): Admitting: General Surgery

## 2024-05-11 VITALS — BP 131/79 | HR 102 | Temp 98.2°F | Resp 16 | Ht 60.0 in | Wt 95.0 lb

## 2024-05-11 DIAGNOSIS — Z09 Encounter for follow-up examination after completed treatment for conditions other than malignant neoplasm: Secondary | ICD-10-CM

## 2024-05-11 DIAGNOSIS — D0511 Intraductal carcinoma in situ of right breast: Secondary | ICD-10-CM

## 2024-05-11 LAB — LIPID PANEL
Chol/HDL Ratio: 2.7 ratio (ref 0.0–4.4)
Cholesterol, Total: 210 mg/dL — ABNORMAL HIGH (ref 100–199)
HDL: 79 mg/dL (ref >39)
LDL Chol Calc (NIH): 112 mg/dL — ABNORMAL HIGH (ref 0–99)
Triglycerides: 111 mg/dL (ref 0–149)
VLDL Cholesterol Cal: 19 mg/dL (ref 5–40)

## 2024-05-11 LAB — HEMOGLOBIN A1C
Est. average glucose Bld gHb Est-mCnc: 94 mg/dL
Hgb A1c MFr Bld: 4.9 % (ref 4.8–5.6)

## 2024-05-11 LAB — TSH+FREE T4
Free T4: 1.04 ng/dL (ref 0.82–1.77)
TSH: 0.917 u[IU]/mL (ref 0.450–4.500)

## 2024-05-11 LAB — VITAMIN D 25 HYDROXY (VIT D DEFICIENCY, FRACTURES): Vit D, 25-Hydroxy: 15.9 ng/mL — ABNORMAL LOW (ref 30.0–100.0)

## 2024-05-12 ENCOUNTER — Other Ambulatory Visit: Payer: Self-pay | Admitting: Family Medicine

## 2024-05-12 NOTE — Progress Notes (Signed)
 Subjective:     Sharon Welch  Patient here for postoperative visit, status post right modified radical mastectomy.  She is doing very well.  She has no significant incisional pain.  Minimal drainage from JP drain. Objective:    BP 131/79   Pulse (!) 102   Temp 98.2 F (36.8 C) (Oral)   Resp 16   Ht 5' (1.524 m)   Wt 95 lb (43.1 kg)   SpO2 98%   BMI 18.55 kg/m   General:  alert, cooperative, and no distress  Right mastectomy incision healing well.  No hematoma or ecchymosis present.  JP drain removed.   SURGICAL PATHOLOGY Surgical pathology Collected: 05/06/24 1226  Lab status: Final-Edited  Resulting lab: [facility]  Value: SURGICAL PATHOLOGY CASE: [id number] PATIENT: Sharon Welch Surgical Pathology Report     Clinical History: Ductal carcinoma in situ, right breast. (nt)     FINAL MICROSCOPIC DIAGNOSIS:  A. BREAST, RIGHT, MASTECTOMY: Ductal carcinoma in situ, solid and f, high nuclear grade, with necrosis and calcifications DCIS greatest dimension: 3.5 cm Margins: Negative     Closest margin: 4 mm, anterior Prognostic markers: ER positive, PR negative Biopsy site and clip Other findings: Neurofibroma of skin See oncology table  B. LYMPH NODE, RIGHT AXILLARY, BIOPSY: Four lymph nodes negative for metastatic carcinoma (0/4)  ONCOLOGY TABLE:  DCIS OF THE BREAST:  Resection Procedure: Right mastectomy and right axillary sentinel lymph nodes Specimen Laterality: Right Histologic Type: Ductal carcinoma in situ Size of DCIS: 3.5 x 3 x 2.5 cm Nuclear Grade: High nuclear grade Necrosis: Present Margins: All surgical margins negative for DCIS      Specify Closest Margin (required only if <74mm): 4 mm, anterior Regional Lymph Nodes:      Number of Lymph Nodes Examined: 4      Number of Sentinel Nodes Examined (if applicable): 0      Number of Lymph Nodes with Macrometastases: 0      Number of Lymph Nodes with Micrometastases): 0      Number of  Lymph Nodes with Isolated Tumor Cells (=0.2 mm or =200 cells): 0 Breast Biomarker Testing Performed on Previous Biopsy:      Testing performed on Case Number: [id number]      Estrogen Receptor: 40%, positive, weak staining intensity      Progesterone Receptor: 0%, negative Pathologic Stage Classification (pTNM, AJCC 8th Edition): pTis, pN0 Representative Tumor Block: A3-A10 (v4.4.0.0)  GROSS DESCRIPTION: A.  Specimen: Received fresh and placed in formalin at 1317 on 05/06/2024 is a specimen clinically labeled as right breast suture superior.  Cold ischemic time less than 1 hour. Specimen integrity (intact/disrupted): Intact Weight: 124 g Size: 13.5 x 10.0 x 3.5 cm Skin: The anterior aspect has a 12.0 x 3.0 cm ellipse of tan-pink skin with a 2.4 cm in diameter areola and 1.2 cm in diameter nipple. Inking: Posterior margin-black, superficial superior-blue, superficial inferior-green. Tumor/cavity: Located in the lower inner quadrant is a 3.5 x 3.0 x 2.5 cm ill-defined tan-white fibrous area with slight grittiness.  There is a coil-shaped biopsy clip grossly identified (clips noted to migrate 1.2 cm medial per imaging report on 02/25/2024).  This area has the following distances from margins: Superior-6.0 cm, inferior-1.0 cm, anterior-0.4 cm, posterior-1.2 cm, medial-1.0 cm, and lateral-6.0 cm.  This area comes within 0.5 cm of the overlying skin in the medial aspect. Uninvolved parenchyma: The cut surface consists of approximately 30% tan-white fibrous tissue and 70% yellow lobulated adipose.  There are  no additional definitive abnormalities identified. Lymph nodes: Not identified Block summary: A1, trisected nipple. A2-10, representative sections from medial towards lateral (A5-6 is 1 bisected full-thickness section; A7-A8 is 1 bisected full-thickness section with coil clip site in A8) A11, upper outer quadrant A12, upper inner quadrant A13, lower inner quadrant A14, lower outer  quadrant  B.  Received fresh are 2 fragments of tan-yellow fibroadipose tissue measuring 4.2 x 3.0 x 1.5 cm in aggregate.  The specimen is dissected for lymph nodes and 4 are identified ranging from 0.5 to 1.2 cm in greatest dimension.  The lymph nodes are submitted as follows: B1, 3 intact lymph nodes.  B2, 1 bisected lymph node. (WC 05/07/2024)  Final Diagnosis performed by Norleen Dover, MD.   Electronically signed 05/10/2024 Technical component performed at [facility], [address] [place], [place] [address].  Professional component performed at [employer], [address] [place], [place] [address].  Immunohistochemistry Technical component (if applicable) was performed at [facility]. [address], [place], [place] [address].   IMMUNOHISTOCHEMISTRY DISCLAIMER (if applicable): Some of these immunohistochemical stains may have been developed and the performance characteristics determine by [facility]. Some may not have been cleared or approved by the U.S. Food and Drug Administration. The FDA has determined that such clearance or approval is not necessary. This test is used for clinical purposes. It should not be regarded as investigational or for research. This laboratory is certified under the Clinical Laboratory Improvement Amendments of 1988 (CLIA-88) as qualified to perform high complexity clinical laboratory testing.  The controls stained appropriately.   IHC stains are performed on formalin fixed, paraffin embedded tissue using a 3,3diaminobenzidine (DAB) chromogen and Leica Bond Autostainer System. The staining intensity of the nucleus is score manually and is reported as the percentage of tumor cell nuclei demonstrating specific nuclear staining. The specimens are fixed in 10% Neutral Formalin for at least 6 hours and up to 72hrs. These tests are validated on decalcified tissue.  Results should be interpreted with caution given the possibility of false negative results on decalcified specimens. Antibody Clones are as follows ER-clone 34F, PR-clone 16, Ki67- clone MM1. Some of these immunohistochemical stains may have been developed and the performance characteristics determined by [facility].   Results Surgical pathology (Order [id number]) MyChart Results Release  MyChart Status: Active  Results Release   Surgical pathology Order: [id number]  Status: Edited Result - FINAL     Next appt: 05/18/2024 at 12:00 PM in General Surgery Maryjane JAYSON Pander, MD)   Test Result Released: Yes (not seen)   0 Result Notes    Component Ref Range & Units (hover) 6 d ago  SURGICAL PATHOLOGY SURGICAL PATHOLOGY CASE: [id number] PATIENT: Sharon Welch Surgical Pathology Report     Clinical History: Ductal carcinoma in situ, right breast. (nt)     FINAL MICROSCOPIC DIAGNOSIS:  A. BREAST, RIGHT, MASTECTOMY: Ductal carcinoma in situ, solid and f, high nuclear grade, with necrosis and calcifications DCIS greatest dimension: 3.5 cm Margins: Negative     Closest margin: 4 mm, anterior Prognostic markers: ER positive, PR negative Biopsy site and clip Other findings: Neurofibroma of skin See oncology table  B. LYMPH NODE, RIGHT AXILLARY, BIOPSY: Four lymph nodes negative for metastatic carcinoma (0/4)  ONCOLOGY TABLE:  DCIS OF THE BREAST:  Resection Procedure: Right mastectomy and right axillary sentinel lymph nodes Specimen Laterality: Right Histologic Type: Ductal carcinoma in situ Size of DCIS: 3.5 x  3 x 2.5 cm Nuclear Grade: High nuclear grade Necrosis: Present Margins: All surgical margins negative for DCIS      Specify Closest Margin (required only if <59mm): 4 mm, anterior Regional Lymph Nodes:      Number of Lymph Nodes Examined: 4      Number of Sentinel Nodes Examined (if applicable): 0      Number of Lymph Nodes with Macrometastases: 0       Number of Lymph Nodes with Micrometastases): 0      Number of Lymph Nodes with Isolated Tumor Cells (=0.2 mm or =200 cells): 0 Breast Biomarker Testing Performed on Previous Biopsy:      Testing performed on Case Number: [id number]      Estrogen Receptor: 40%, positive, weak staining intensity      Progesterone Receptor: 0%, negative Pathologic Stage Classification (pTNM, AJCC 8th Edition): pTis, pN0 Representative Tumor Block: A3-A10 (v4.4.0.0)  GROSS DESCRIPTION: A.  Specimen: Received fresh and placed in formalin at 1317 on 05/06/2024 is a specimen clinically labeled as right breast suture superior.  Cold ischemic time less than 1 hour. Specimen integrity (intact/disrupted): Intact Weight: 124 g Size: 13.5 x 10.0 x 3.5 cm Skin: The anterior aspect has a 12.0 x 3.0 cm ellipse of tan-pink skin with a 2.4 cm in diameter areola and 1.2 cm in diameter nipple. Inking: Posterior margin-black, superficial superior-blue, superficial inferior-green. Tumor/cavity: Located in the lower inner quadrant is a 3.5 x 3.0 x 2.5 cm ill-defined tan-white fibrous area with slight grittiness.  There is a coil-shaped biopsy clip grossly identified (clips noted to migrate 1.2 cm medial per imaging report on 02/25/2024).  This area has the following distances from margins: Superior-6.0 cm, inferior-1.0 cm, anterior-0.4 cm, posterior-1.2 cm, medial-1.0 cm, and lateral-6.0 cm.  This area comes within 0.5 cm of the overlying skin in the medial aspect. Uninvolved parenchyma: The cut surface consists of approximately 30% tan-white fibrous tissue and 70% yellow lobulated adipose.  There are no additional definitive abnormalities identified. Lymph nodes: Not identified Block summary: A1, trisected nipple. A2-10, representative sections from medial towards lateral (A5-6 is 1 bisected full-thickness section; A7-A8 is 1 bisected full-thickness section with coil clip site in A8) A11, upper outer quadrant A12,  upper inner quadrant A13, lower inner quadrant A14, lower outer quadrant  B.  Received fresh are 2 fragments of tan-yellow fibroadipose tissue measuring 4.2 x 3.0 x 1.5 cm in aggregate.  The specimen is dissected for lymph nodes and 4 are identified ranging from 0.5 to 1.2 cm in greatest dimension.  The lymph nodes are submitted as follows: B1, 3 intact lymph nodes.  B2, 1 bisected lymph node. (WC 05/07/2024)  Final Diagnosis performed by Norleen Dover, MD.   Electronically signed 05/10/2024 Technical component performed at [facility], [address], [place], [place] [address].  Professional component performed at [employer]. [facility], [address] [place], [place] [address].  Immunohistochemistry Technical component (if applicable) was performed at [facility]. [address], [place], [place] [address].   IMMUNOHISTOCHEMISTRY DISCLAIMER (if applicable): Some of these immunohistochemical stains may have been developed and the performance characteristics determine by [facility]. Some may not have been cleared or approved by the U.S. Food and Drug Administration. The FDA has determined that such clearance or approval is not necessary. This test is used for clinical purposes. It should not be regarded as investigational or for research. This laboratory is certified under the Clinical Laboratory  Improvement Amendments of 1988 (CLIA-88) as qualified to perform high complexity clinical laboratory testing.  The controls stained appropriately.   IHC stains are performed on formalin fixed, paraffin embedded tissue using a 3,3diaminobenzidine (DAB) chromogen and Leica Bond Autostainer System. The staining intensity of the nucleus is score manually and is reported as the percentage of tumor cell nuclei demonstrating specific nuclear staining. The specimens are fixed in 10% Neutral Formalin for at least 6 hours and up  to 72hrs. These tests are validated on decalcified tissue. Results should be interpreted with caution given the possibility of false negative results on decalcified specimens. Antibody Clones are as follows ER-clone 75F, PR-clone 16, Ki67- clone MM1. Some of these immunohistochemical stains may have been developed and the performance characteristics determined by [facility].  Resulting Agency [facility]        Specimen Collected: 05/06/24 12:26 Last Resulted: 05/10/24 11:04        Assessment:    Doing well postoperatively.    Plan:   Patient will return next week for staple removal.  Will refer patient to Oncology.  Overall doing great.  Patient aware of pathology results.

## 2024-05-13 LAB — GENECONNECT MOLECULAR SCREEN

## 2024-05-14 LAB — TOXASSURE SELECT 13 (MW), URINE

## 2024-05-16 ENCOUNTER — Ambulatory Visit: Payer: Self-pay | Admitting: Family Medicine

## 2024-05-16 DIAGNOSIS — E559 Vitamin D deficiency, unspecified: Secondary | ICD-10-CM

## 2024-05-16 MED ORDER — VITAMIN D (ERGOCALCIFEROL) 1.25 MG (50000 UNIT) PO CAPS
50000.0000 [IU] | ORAL_CAPSULE | ORAL | 1 refills | Status: AC
Start: 2024-05-16 — End: ?

## 2024-05-17 ENCOUNTER — Ambulatory Visit (INDEPENDENT_AMBULATORY_CARE_PROVIDER_SITE_OTHER): Admitting: General Surgery

## 2024-05-17 ENCOUNTER — Telehealth: Payer: Self-pay | Admitting: Medical Genetics

## 2024-05-17 ENCOUNTER — Other Ambulatory Visit: Payer: Self-pay

## 2024-05-17 ENCOUNTER — Encounter: Payer: Self-pay | Admitting: General Surgery

## 2024-05-17 VITALS — BP 123/82 | HR 100 | Temp 98.5°F | Resp 16 | Ht 60.0 in | Wt 95.0 lb

## 2024-05-17 DIAGNOSIS — D0511 Intraductal carcinoma in situ of right breast: Secondary | ICD-10-CM

## 2024-05-17 NOTE — Progress Notes (Signed)
 Patient seen in office to have staples removed from right breast excision.   Incision healing well.  Steri Strips placed.   Patient tolerated procedure well.

## 2024-05-18 ENCOUNTER — Encounter: Admitting: General Surgery

## 2024-05-19 ENCOUNTER — Encounter: Payer: Self-pay | Admitting: Family Medicine

## 2024-05-24 ENCOUNTER — Other Ambulatory Visit: Payer: Self-pay | Admitting: Family Medicine

## 2024-05-24 DIAGNOSIS — G8929 Other chronic pain: Secondary | ICD-10-CM

## 2024-05-24 MED ORDER — HYDROCODONE-ACETAMINOPHEN 10-325 MG PO TABS: 1.0000 | ORAL_TABLET | Freq: Four times a day (QID) | ORAL | 0 refills | Status: AC | PRN

## 2024-05-24 NOTE — Telephone Encounter (Signed)
 Refills sent, please encourage to follow up with the referral placed to pain management

## 2024-05-31 DIAGNOSIS — R002 Palpitations: Secondary | ICD-10-CM | POA: Diagnosis not present

## 2024-05-31 DIAGNOSIS — R55 Syncope and collapse: Secondary | ICD-10-CM

## 2024-06-02 NOTE — Telephone Encounter (Signed)
 [employer]   06/02/2024 9:59AM  Attempted to contact participant 3 times via phone call and MyChart message to discuss TNP results and offer a new [employer] order. Participant may contact the research team at any time to discuss TNP results and request a new Gene[employer] order.   Best,  Jordyn Pennstrom, BS [place]  [facility] Clinical Research Specialist II Direct Dial: [phone]  Fax: [phone]

## 2024-06-06 NOTE — Progress Notes (Unsigned)
 Hematology-Oncology Clinic Note  Bacchus, Meade PEDLAR, FNP   Reason for Referral: Right breat DCIS  Oncology History: I have reviewed her chart and materials related to her cancer extensively and collaborated history with the patient. Summary of oncologic history is as follows:  Diagnosis: Right breat DCIS  -02/17/2024: Mammogram: Indeterminate 2.5 cm group of RIGHT lower inner breast calcification at anterior to middle depth. Indeterminate 1.4 cm group of RIGHT lower inner breast calcifications at middle depth. -02/25/2024: Right breast core needle biopsy, lower inner, posterior aspect: DCIS, Grade III  - lower inner, anterior aspect: DCIS, Grade II -05/06/2024: Right breast mastectomy: DCIS greatest dimension: 3.5cm, ER positive, PR negative.   -Right axillary lymph nodes negative for carcinoma  (0/4)  -pTis, pN0   History of Presenting Illness: Discussed the use of AI scribe software for clinical note transcription with the patient, who gave verbal consent to proceed.  History of Present Illness       Medical History: Past Medical History:  Diagnosis Date   Asthma    seasonal   Bruising, spontaneous 12/08/2013   Cancer (HCC)    cervical cancer - had hysterectomy   Chronic back pain    Chronic back pain    DDD (degenerative disc disease), lumbar    DDD (degenerative disc disease), lumbar    Dysrhythmia    tachycardia   Fibromyalgia    Headache(784.0)    migraines   History of kidney stones    Ovarian cyst    Partial tear subscapularis tendon 12/27/2011   Scoliosis    Seizures (HCC)    started 9/12-Dx 15 yrs ago-unknow origin and on meds    Surgical history: Past Surgical History:  Procedure Laterality Date   ABDOMINAL HYSTERECTOMY  2012   BACK SURGERY  1989   scoliosis throsic-rods   BREAST BIOPSY Right 02/25/2024   MM RT BREAST BX W LOC DEV 1ST LESION IMAGE BX SPEC STEREO GUIDE 02/25/2024 [facility]   BREAST BIOPSY Right 02/25/2024   MM RT BREAST  BX W LOC DEV EA AD LESION IMG BX SPEC STEREO GUIDE 02/25/2024 [facility]   CESAREAN SECTION     CHOLECYSTECTOMY N/A 11/17/2012   Procedure: LAPAROSCOPIC CHOLECYSTECTOMY WITH INTRAOPERATIVE CHOLANGIOGRAM;  Surgeon: Lynda Leos, MD;  Location: [facility];  Service: General;  Laterality: N/A;   COLONOSCOPY WITH PROPOFOL  N/A 04/21/2020   Procedure: COLONOSCOPY WITH PROPOFOL ;  Surgeon: Rollin Dover, MD;  Location: [facility];  Service: Endoscopy;  Laterality: N/A;   ENDOSCOPIC RETROGRADE CHOLANGIOPANCREATOGRAPHY (ERCP) WITH PROPOFOL  N/A 06/11/2019   Procedure: ENDOSCOPIC RETROGRADE CHOLANGIOPANCREATOGRAPHY (ERCP) WITH PROPOFOL ;  Surgeon: Rollin Dover, MD;  Location: [facility];  Service: Endoscopy;  Laterality: N/A;   ERCP N/A 05/07/2013   Procedure: ENDOSCOPIC RETROGRADE CHOLANGIOPANCREATOGRAPHY (ERCP);  Surgeon: Dover JONETTA Rollin, MD;  Location: [facility];  Service: Endoscopy;  Laterality: N/A;  start ercp first, if canulation fails switch to EUS    ESOPHAGOGASTRODUODENOSCOPY (EGD) WITH PROPOFOL  N/A 05/15/2019   Procedure: ESOPHAGOGASTRODUODENOSCOPY (EGD) WITH PROPOFOL ;  Surgeon: Harvey Margo CROME, MD;  Location: [facility];  Service: Endoscopy;  Laterality: N/A;   ESOPHAGOGASTRODUODENOSCOPY (EGD) WITH PROPOFOL  N/A 03/03/2020   Procedure: ESOPHAGOGASTRODUODENOSCOPY (EGD) WITH PROPOFOL ;  Surgeon: Rollin Dover, MD;  Location: [facility];  Service: Endoscopy;  Laterality: N/A;   ESOPHAGOGASTRODUODENOSCOPY (EGD) WITH PROPOFOL  N/A 04/21/2020   Procedure: ESOPHAGOGASTRODUODENOSCOPY (EGD) WITH PROPOFOL ;  Surgeon: Rollin Dover, MD;  Location: [facility];  Service: Endoscopy;  Laterality: N/A;   EUS N/A 11/03/2012   Procedure: UPPER ENDOSCOPIC  ULTRASOUND (EUS) LINEAR;  Surgeon: Belvie JONETTA Just, MD;  Location: [facility];  Service: Endoscopy;  Laterality: N/A;   left arm     MASTECTOMY MODIFIED RADICAL Right 05/06/2024   Procedure: MASTECTOMY, MODIFIED RADICAL;  Surgeon: Mavis Anes, MD;   Location: [facility];  Service: General;  Laterality: Right;   NECK SURGERY     POLYPECTOMY  04/21/2020   Procedure: POLYPECTOMY;  Surgeon: Just Belvie, MD;  Location: [facility];  Service: Endoscopy;;   REMOVAL OF STONES  06/11/2019   Procedure: REMOVAL OF STONES;  Surgeon: Just Belvie, MD;  Location: [facility];  Service: Endoscopy;;   TOOTH EXTRACTION N/A 05/24/2022   Procedure: DENTAL RESTORATION/EXTRACTIONS;  Surgeon: Sheryle Hamilton, DMD;  Location: [facility];  Service: Oral Surgery;  Laterality: N/A;   TOTAL HIP ARTHROPLASTY Left 11/17/2022   Procedure: TOTAL HIP ARTHROPLASTY ANTERIOR APPROACH;  Surgeon: Beverley Evalene JONETTA, MD;  Location: [facility];  Service: Orthopedics;  Laterality: Left;   TOTAL SHOULDER ARTHROPLASTY Left 11/04/2023   Procedure: ARTHROPLASTY, SHOULDER, TOTAL;  Surgeon: Josefina Chew, MD;  Location: [facility];  Service: Orthopedics;  Laterality: Left;   TUBAL LIGATION     UPPER ESOPHAGEAL ENDOSCOPIC ULTRASOUND (EUS) N/A 06/11/2019   Procedure: UPPER ESOPHAGEAL ENDOSCOPIC ULTRASOUND (EUS);  Surgeon: Just Belvie, MD;  Location: [facility];  Service: Endoscopy;  Laterality: N/A;     Allergies:  is allergic to ultram [tramadol], aleve [naproxen], blueberry [vaccinium angustifolium], coconut (cocos nucifera), depakote  [divalproex  sodium], nsaids, and pineapple.  Medications:  Current Outpatient Medications  Medication Sig Dispense Refill   albuterol  (VENTOLIN  HFA) 108 (90 Base) MCG/ACT inhaler Inhale 1-2 puffs into the lungs every 4 (four) hours as needed for wheezing or shortness of breath.     ALPRAZolam  (XANAX ) 0.25 MG tablet Take 1-2 tablets (0.25-0.5 mg total) by mouth 4 (four) times daily as needed for anxiety or sleep. 10 tablet 0   amitriptyline  (ELAVIL ) 25 MG tablet Take 50 mg by mouth at bedtime.     dexlansoprazole (DEXILANT) 60 MG capsule Take 60 mg by mouth every morning.     diltiazem  (CARDIZEM  CD) 180 MG 24 hr capsule TAKE ONE CAPSULE BY MOUTH EVERY DAY 90  capsule 3   furosemide  (LASIX ) 20 MG tablet Take 1 tablet (20 mg total) by mouth daily as needed (increasing lower extremity edema).     HYDROcodone -acetaminophen  (NORCO) 10-325 MG tablet Take 1 tablet by mouth every 6 (six) hours as needed for severe pain (pain score 7-10). 30 tablet 0   lamoTRIgine  (LAMICTAL ) 150 MG tablet Take 1 tablet (150 mg total) by mouth 2 (two) times daily. 60 tablet 11   levETIRAcetam  (KEPPRA ) 500 MG tablet Take 1 tablet (500 mg total) by mouth 2 (two) times daily. 180 tablet 0   linaclotide  (LINZESS ) 145 MCG CAPS capsule Take 145 mcg by mouth daily before breakfast.     meclizine  (ANTIVERT ) 25 MG tablet Take 1 tablet (25 mg total) by mouth 3 (three) times daily. 30 tablet 0   methocarbamol  (ROBAXIN ) 500 MG tablet TAKE 1 TABLET BY MOUTH 3 TIMES A DAILY. 90 tablet 3   Milnacipran  (SAVELLA ) 50 MG TABS tablet Take 50 mg by mouth 2 (two) times daily.     promethazine  (PHENERGAN ) 25 MG tablet Take 25 mg by mouth every 6 (six) hours as needed for nausea or vomiting.     QULIPTA  60 MG TABS Take 1 tablet (60 mg total) by mouth daily. 30 tablet 11   rizatriptan (MAXALT) 10 MG tablet Take  10 mg by mouth See admin instructions. 10 mg as needed for migraine, may repeat 10 mg in 2 hours if headache persists or recurs.     Vitamin D , Ergocalciferol , (DRISDOL ) 1.25 MG (50000 UNIT) CAPS capsule Take 1 capsule (50,000 Units total) by mouth every 7 (seven) days. 27 capsule 1   No current facility-administered medications for this visit.     Physical Examination: ECOG PERFORMANCE STATUS: {CHL ONC ECOG [phone]}  There were no vitals filed for this visit. There were no vitals filed for this visit.  GENERAL:alert, no distress and comfortable SKIN: skin color, texture, turgor are normal, no rashes or significant lesions EYES: normal, conjunctiva are pink and non-injected, sclera clear OROPHARYNX:no exudate, no erythema and lips, buccal mucosa, and tongue normal  NECK: supple,  thyroid  normal size, non-tender, without nodularity LYMPH:  no palpable lymphadenopathy in the cervical, axillary or inguinal LUNGS: clear to auscultation and percussion with normal breathing effort HEART: regular rate & rhythm and no murmurs and no lower extremity edema ABDOMEN:abdomen soft, non-tender and normal bowel sounds Musculoskeletal:no cyanosis of digits and no clubbing  PSYCH: alert & oriented x 3 with fluent speech NEURO: no focal motor/sensory deficits   Laboratory Data: I have reviewed the data as listed Lab Results  Component Value Date   WBC 12.2 (H) 05/07/2024   HGB 12.6 05/07/2024   HCT 36.2 05/07/2024   MCV 93.1 05/07/2024   PLT 297 05/07/2024      Chemistry      Component Value Date/Time   NA 140 05/07/2024 0358   NA 141 04/20/2024 1357   K 2.8 (L) 05/07/2024 0358   CL 102 05/07/2024 0358   CO2 26 05/07/2024 0358   BUN 8 05/07/2024 0358   BUN 9 04/20/2024 1357   CREATININE 0.56 05/07/2024 0358      Component Value Date/Time   CALCIUM  8.1 (L) 05/07/2024 0358   CALCIUM  5.1 (LL) 05/15/2019 0522   ALKPHOS 245 (H) 05/04/2024 1243   AST 16 05/04/2024 1243   ALT 6 05/04/2024 1243   BILITOT 0.3 05/04/2024 1243   BILITOT 0.3 03/19/2023 1535        Radiographic Studies: I have personally reviewed the radiological images as listed and agreed with the findings in the report.  LONG TERM MONITOR-LIVE TELEMETRY (3-14 DAYS)   Patch wear time was for 6 days and 20 hours.   Normal sinus rhythm ranging from 64 to 140 bpm with an average HR 87  bpm.   No evidence of atrial fibrillation/flutter, ventricular arrhythmias,  high-grade AV block or pauses.   <1% PAC burden and <1% PVC burden.   Patient triggered events correlated with NSR (77 to 121 bpm) and PAC.    ASSESSMENT & PLAN:  Patient is a 53 y.o. female presenting for Right breast DCIS  Assessment and Plan Assessment & Plan       No orders of the defined types were placed in this  encounter.   The total time spent in the appointment was {CHL ONC TIME VISIT - DTPQU:[id number]} encounter with patients including review of chart and various tests results, discussions about plan of care and coordination of care plan   All questions were answered. The patient knows to call the clinic with any problems, questions or concerns. No barriers to learning was detected.  Mickiel Dry, MD 12/14/202510:48 PM

## 2024-06-07 ENCOUNTER — Inpatient Hospital Stay

## 2024-06-07 ENCOUNTER — Inpatient Hospital Stay: Attending: Oncology | Admitting: Oncology

## 2024-06-07 VITALS — BP 125/88 | HR 123 | Temp 97.0°F | Resp 19 | Wt 93.6 lb

## 2024-06-07 DIAGNOSIS — F17210 Nicotine dependence, cigarettes, uncomplicated: Secondary | ICD-10-CM | POA: Insufficient documentation

## 2024-06-07 DIAGNOSIS — Z886 Allergy status to analgesic agent status: Secondary | ICD-10-CM | POA: Insufficient documentation

## 2024-06-07 DIAGNOSIS — M419 Scoliosis, unspecified: Secondary | ICD-10-CM | POA: Insufficient documentation

## 2024-06-07 DIAGNOSIS — Z8042 Family history of malignant neoplasm of prostate: Secondary | ICD-10-CM | POA: Insufficient documentation

## 2024-06-07 DIAGNOSIS — M797 Fibromyalgia: Secondary | ICD-10-CM | POA: Diagnosis not present

## 2024-06-07 DIAGNOSIS — Z8052 Family history of malignant neoplasm of bladder: Secondary | ICD-10-CM | POA: Insufficient documentation

## 2024-06-07 DIAGNOSIS — Z79811 Long term (current) use of aromatase inhibitors: Secondary | ICD-10-CM | POA: Insufficient documentation

## 2024-06-07 DIAGNOSIS — D0511 Intraductal carcinoma in situ of right breast: Secondary | ICD-10-CM | POA: Insufficient documentation

## 2024-06-07 DIAGNOSIS — Z803 Family history of malignant neoplasm of breast: Secondary | ICD-10-CM | POA: Insufficient documentation

## 2024-06-07 DIAGNOSIS — G8929 Other chronic pain: Secondary | ICD-10-CM | POA: Insufficient documentation

## 2024-06-07 DIAGNOSIS — N951 Menopausal and female climacteric states: Secondary | ICD-10-CM | POA: Diagnosis not present

## 2024-06-07 DIAGNOSIS — Z9049 Acquired absence of other specified parts of digestive tract: Secondary | ICD-10-CM | POA: Insufficient documentation

## 2024-06-07 DIAGNOSIS — Z800 Family history of malignant neoplasm of digestive organs: Secondary | ICD-10-CM | POA: Insufficient documentation

## 2024-06-07 DIAGNOSIS — Z9011 Acquired absence of right breast and nipple: Secondary | ICD-10-CM | POA: Insufficient documentation

## 2024-06-07 DIAGNOSIS — Z8541 Personal history of malignant neoplasm of cervix uteri: Secondary | ICD-10-CM | POA: Diagnosis not present

## 2024-06-07 DIAGNOSIS — R232 Flushing: Secondary | ICD-10-CM | POA: Insufficient documentation

## 2024-06-07 DIAGNOSIS — Z885 Allergy status to narcotic agent status: Secondary | ICD-10-CM | POA: Insufficient documentation

## 2024-06-07 DIAGNOSIS — Z79899 Other long term (current) drug therapy: Secondary | ICD-10-CM | POA: Diagnosis not present

## 2024-06-07 DIAGNOSIS — Z91018 Allergy to other foods: Secondary | ICD-10-CM | POA: Insufficient documentation

## 2024-06-07 DIAGNOSIS — Z90710 Acquired absence of both cervix and uterus: Secondary | ICD-10-CM | POA: Diagnosis not present

## 2024-06-07 DIAGNOSIS — M2550 Pain in unspecified joint: Secondary | ICD-10-CM | POA: Diagnosis not present

## 2024-06-07 MED ORDER — ANASTROZOLE 1 MG PO TABS: 1.0000 mg | ORAL_TABLET | Freq: Every day | ORAL | 1 refills | Status: AC

## 2024-06-07 NOTE — Patient Instructions (Addendum)
 [facility]  Discharge Instructions  You were seen and examined today by Dr. Davonna. Dr. Davonna is a medical oncologist, meaning that she specializes in the treatment of cancer diagnoses. Dr. Davonna discussed your past medical history, family history of cancers, and the events that led to you being here today.  You were referred to Dr. 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 This is a Stage 0 or pre-breast cancer. Your DCIS was removed during surgery. The goal now is to prevent the return of DCIS or any progression to invasive breast cancer. This is done by taking an anti-estrogen pill once daily for at least 5 years.   Take calcium  with your vitamin D  tablets.  Follow-up as scheduled.  Thank you for choosing [facility] to provide your oncology and hematology care.   To afford each patient quality time with our provider, please arrive at least 15 minutes before your scheduled appointment time. You may need to reschedule your appointment if you arrive late (10 or more minutes). Arriving late affects you and other patients whose appointments are after yours.  Also, if you miss three or more appointments without notifying the office, you may be dismissed from the clinic at the providers discretion.    Again, thank you for choosing [facility].  Our hope is that these requests will decrease the amount of time that you wait before being seen by our physicians.   If you have a lab appointment with the Cancer Center - please note that after April 8th, all labs will be drawn in the cancer center.  You do not have to check in or register with the main entrance as you have in the past but will complete your check-in at the cancer center.            _____________________________________________________________  Should you have questions after your visit to [facility],  please contact our office at [phone] and follow the prompts.  Our office hours are 8:00 a.m. to 4:30 p.m. Monday - Thursday and 8:00 a.m. to 2:30 p.m. Friday.  Please note that voicemails left after 4:00 p.m. may not be returned until the following business day.  We are closed weekends and all major holidays.  You do have access to a nurse 24-7, just call the main number to the clinic [phone] and do not press any options, hold on the line and a nurse will answer the phone.    For prescription refill requests, have your pharmacy contact our office and allow 72 hours.    Masks are no longer required in the cancer centers. If you would like for your care team to wear a mask while they are taking care of you, please let them know. You may have one support person who is at least 55 years old accompany you for your appointments.

## 2024-06-14 ENCOUNTER — Ambulatory Visit (HOSPITAL_COMMUNITY)
Admission: RE | Admit: 2024-06-14 | Discharge: 2024-06-14 | Disposition: A | Discharge status: Home / Self Care | Source: Ambulatory Visit | Attending: Oncology | Admitting: Oncology

## 2024-06-14 ENCOUNTER — Other Ambulatory Visit: Payer: Self-pay

## 2024-06-14 DIAGNOSIS — M810 Age-related osteoporosis without current pathological fracture: Secondary | ICD-10-CM | POA: Diagnosis not present

## 2024-06-14 DIAGNOSIS — D0511 Intraductal carcinoma in situ of right breast: Secondary | ICD-10-CM | POA: Insufficient documentation

## 2024-06-21 ENCOUNTER — Encounter: Payer: Self-pay | Admitting: *Deleted

## 2024-06-26 ENCOUNTER — Encounter: Payer: Self-pay | Admitting: Family Medicine

## 2024-07-08 ENCOUNTER — Encounter: Payer: Self-pay | Admitting: Neurology

## 2024-07-08 ENCOUNTER — Ambulatory Visit: Admitting: Neurology

## 2024-07-08 VITALS — BP 112/80 | HR 100 | Ht 60.0 in | Wt 95.5 lb

## 2024-07-08 DIAGNOSIS — Q283 Other malformations of cerebral vessels: Secondary | ICD-10-CM | POA: Diagnosis not present

## 2024-07-08 DIAGNOSIS — G40909 Epilepsy, unspecified, not intractable, without status epilepticus: Secondary | ICD-10-CM

## 2024-07-08 DIAGNOSIS — G43709 Chronic migraine without aura, not intractable, without status migrainosus: Secondary | ICD-10-CM | POA: Diagnosis not present

## 2024-07-08 MED ORDER — LAMOTRIGINE 200 MG PO TABS: 200.0000 mg | ORAL_TABLET | Freq: Two times a day (BID) | ORAL | 11 refills | Status: AC

## 2024-07-08 NOTE — Progress Notes (Signed)
 "  Chief Complaint  Patient presents with   Follow-up    Pt in room 14. Alone.Here for seizure follow up.      ASSESSMENT AND PLAN  Sharon Welch is a 54 y.o. 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 Chronic migraine headaches  Recurrent confusion, staring spells, differentiation diagnosis include complex partial seizure versus opiates overuse, medication side effect  MRI of the brain showed stable left frontal cavernous angioma  EEG was normal, 72 hours EEG  She has she has a history of stress, anxiety, weight loss, would prefer simplify her medication list, adding on Keppra  did not improve her reported episode of staring spells, that is often triggered by stress, will taper off Keppra , slight higher dose of lamotrigine  from 150 to 200 mg twice a day  Document all the spells   Return To Clinic With NP In  6 Months    DIAGNOSTIC DATA (LABS, IMAGING, TESTING) - I reviewed patient records, labs, notes, testing and imaging myself where available. Personally reviewed MRI of the brain with and without contrast from 2012, left temporoparietal region chronic hemorrhage, likely related to chronic hemorrhage from cavernoma  MRI of the brain showed no significant abnormality  Laboratory evaluation in December 2023, CBC showed normal hemoglobin of 13.7, mild elevation of WBC 11, normal BMP  MEDICAL HISTORY:  Sharon Welch, is a 54 year old female, accompanied by her daughter seen in request by her primary care from [facility] medical doctor Bertell Satterfield, for evaluation of seizure, initial evaluation was on July 18, 2022  I reviewed and summarized the referring note. PMHX Chronic migraine,  GERD, history of gastric ulcer. Chronic insomnia Chronic constipation Fibromyogia Chronic migraine Cervical Cancer, s/p hysterectomy Smoke   She was under the care of by Dr. Milton, who has closed his practice, she was given the  diagnosis of seizure, chronic migraine headaches  Patient reported history of migraine since 54 years old, lateralized severe pounding headache with light noise sensitivity  Following her gastric ulcer procedure in 2010, she also developed seizure, sometimes has aura of strange smell, followed by loss of consciousness, but sometimes she went into generalized tonic-clonic seizure  Last generalized seizure without warning signs was in December 2023, at nighttime she was watching TV, woke up had a significant left side retro-orbital area headache, more intense than her usual migraine headaches, also has brain fog, confusion, lasting for few hours  Yesterday July 17, 2022, she also had an episode of sudden onset vertigo, confusion, time laps,  Daughter also reported she has zoning out spells, couple times each months, occasionally with finger fidgety, blinking  MRI of the brain in 2012, evidence of left parietotemporal region cavernoma, hemorrhage, no mass effect,  She has been treated with Topamax  100 mg twice a day, worried about her weight loss, continue to have migraine headache once or twice each week,  Has been on disability for many years due to history of scoliosis, Harrington rod placement, quit driving around 7978,  UPDATE Oct 29 2022: She is with her daughter at visit, her seizure-like spell overall has much improved, taking Depakote  ER 500 mg 2 tablets every night, level was 39  She complains of bilateral hands tremor, intermittent dizziness, weakness, legs gave out under her  She still report seizure activity 2 to 3 times a month, blank stares, sometimes came out of it body shaking, confused for a while afterwards.  Personally reviewed MRI of the brain with without contrast August 02, 2022, chronic chemo product in the patent consistent with cerebral cavernous angioma in the posterior left subcortical frontal lobe, stable compared to previous MRI in November 2015.  Hospital  admission from January 25 to 27, 2024, for confusion, slurred speech, dizziness, headaches, was diagnosed with idiopathic hypotension, history of peptic ulcer disease,  [place] presentation September 20, 2022, not feeling well, episode of feeling shaky, body heaviness, especially both legs   Laboratory evaluations in March 2024: Normal inactive magnesium , HIV, B12, CMP, showed low total protein and albumin, calcium  was 7.6,  UDS was positive for opiates,  UPDATE Jul 08 2024: Worry about worsening tremor taking Depakote , she was switched to lamotrigine  100 mg twice a day, she was followed up by nurse practitioner Harlene, continue to report staring spells, Keppra  500 mg twice a day was added on in April 2025,  She complains of a lot of stress over the past few months, 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  PHYSICAL EXAMNIATION:  Gen: NAD, conversant, well nourised, well groomed                     Cardiovascular: Regular rate rhythm, no peripheral edema, warm, nontender. Eyes: Conjunctivae clear without exudates or hemorrhage Neck: Supple, no carotid bruits. Pulmonary: Clear to auscultation bilaterally   NEUROLOGICAL EXAM:  MENTAL STATUS: Speech/cognition:  Fragile, thin lady, awake, alert, oriented to history taking and casual conversation CRANIAL NERVES: CN II: Visual fields are  full to confrontation. Pupils are round equal and briskly reactive to light. CN III, IV, VI: extraocular movement are normal. No ptosis. CN V: Facial sensation is intact to light touch CN VII: Face is symmetric with normal eye closure  CN VIII: Hearing is normal to causal conversation. CN IX, X: Phonation is normal. CN XI: Head turning and shoulder shrug are intact  MOTOR: mild hand action tremor, no weakness.  REFLEXES: Reflexes are 2 and symmetric at the biceps, triceps, knees, and ankles. Plantar responses are flexor.  SENSORY: Intact to light touch, pinprick and vibratory sensation are intact in fingers and toes.  COORDINATION: There is no trunk or limb dysmetria noted.  GAIT/STANCE: Able to get up from seated position arm crossed, antalgic gait REVIEW OF SYSTEMS:  Full 14 system review of systems performed and notable only for as above All other review of systems were negative.   ALLERGIES: Allergies  Allergen Reactions   Ultram [Tramadol] Other (See Comments)    Possible seizures   Aleve [Naproxen] Other (See Comments)    Ulcers   Blueberry [Vaccinium Angustifolium] Swelling   Coconut (Cocos Nucifera) Swelling   Depakote  [Divalproex  Sodium] Other (See Comments)    Insomnia . (Changed from divalproex  to Lamictal  due to side effects)   Nsaids Other (See Comments)    History of bleeding ulcers.   Pineapple Other (See  Comments)    Migraine    HOME MEDICATIONS: Current Outpatient Medications  Medication Sig Dispense Refill   albuterol  (VENTOLIN  HFA) 108 (90 Base) MCG/ACT inhaler Inhale 1-2 puffs into the lungs every 4 (four) hours as needed for wheezing or shortness of breath.     amitriptyline  (ELAVIL ) 25 MG tablet Take 50 mg by mouth at bedtime.     anastrozole  (ARIMIDEX ) 1 MG tablet Take 1 tablet (1 mg total) by mouth daily. 90 tablet 1   dexlansoprazole (DEXILANT) 60 MG capsule Take 60 mg by mouth every morning.     diltiazem  (CARDIZEM  CD) 180 MG 24 hr capsule  TAKE ONE CAPSULE BY MOUTH EVERY DAY 90 capsule 3   HYDROcodone -acetaminophen  (NORCO) 10-325 MG tablet Take 1 tablet by mouth every 6 (six) hours as needed for severe pain (pain score 7-10). 30 tablet 0   lamoTRIgine  (LAMICTAL ) 150 MG tablet Take 1 tablet (150 mg total) by mouth 2 (two) times daily. 60 tablet 11   levETIRAcetam  (KEPPRA ) 500 MG tablet Take 1 tablet (500 mg total) by mouth 2 (two) times daily. 180 tablet 0   linaclotide  (LINZESS ) 145 MCG CAPS capsule Take 145 mcg by mouth daily before breakfast.     meclizine  (ANTIVERT ) 25 MG tablet Take 1 tablet (25 mg total) by mouth 3 (three) times daily. 30 tablet 0   methocarbamol  (ROBAXIN ) 500 MG tablet TAKE 1 TABLET BY MOUTH 3 TIMES A DAILY. 90 tablet 3   Milnacipran  (SAVELLA ) 50 MG TABS tablet Take 50 mg by mouth 2 (two) times daily.     QULIPTA  60 MG TABS Take 1 tablet (60 mg total) by mouth daily. 30 tablet 11   rizatriptan (MAXALT) 10 MG tablet TAKE ONE TABLET BY MOUTH EVERY DAY. 30 tablet 11   ALPRAZolam  (XANAX ) 0.25 MG tablet Take 1-2 tablets (0.25-0.5 mg total) by mouth 4 (four) times daily as needed for anxiety or sleep. (Patient not taking: Reported on 07/08/2024) 10 tablet 0   furosemide  (LASIX ) 20 MG tablet Take 1 tablet (20 mg total) by mouth daily as needed (increasing lower extremity edema). (Patient not taking: Reported on 07/08/2024)     promethazine  (PHENERGAN ) 25 MG tablet Take 25 mg by mouth every 6 (six) hours as needed for nausea or vomiting. (Patient not taking: Reported on 07/08/2024)     Vitamin D , Ergocalciferol , (DRISDOL ) 1.25 MG (50000 UNIT) CAPS capsule Take 1 capsule (50,000 Units total) by mouth every 7 (seven) days. (Patient not taking: Reported on 07/08/2024) 27 capsule 1   No current facility-administered medications for this visit.    PAST MEDICAL HISTORY: Past Medical History:  Diagnosis Date   Asthma    seasonal   Bruising, spontaneous 12/08/2013   Cancer (HCC)    cervical cancer - had hysterectomy    Chronic back pain    Chronic back pain    DDD (degenerative disc disease), lumbar    DDD (degenerative disc disease), lumbar    Dysrhythmia    tachycardia   Fibromyalgia    Headache(784.0)    migraines   History of kidney stones    Ovarian cyst    Partial tear subscapularis tendon 12/27/2011   Scoliosis    Seizures (HCC)    started 9/12-Dx 15 yrs ago-unknow origin and on meds    PAST SURGICAL HISTORY: Past Surgical History:  Procedure Laterality Date   ABDOMINAL HYSTERECTOMY  2012   BACK SURGERY  1989   scoliosis throsic-rods   BREAST BIOPSY Right 02/25/2024   MM  RT BREAST BX W LOC DEV 1ST LESION IMAGE BX SPEC STEREO GUIDE 02/25/2024 [facility]   BREAST BIOPSY Right 02/25/2024   MM RT BREAST BX W LOC DEV EA AD LESION IMG BX SPEC STEREO GUIDE 02/25/2024 [facility]   CESAREAN SECTION     CHOLECYSTECTOMY N/A 11/17/2012   Procedure: LAPAROSCOPIC CHOLECYSTECTOMY WITH INTRAOPERATIVE CHOLANGIOGRAM;  Surgeon: Lynda Leos, MD;  Location: [facility];  Service: General;  Laterality: N/A;   COLONOSCOPY WITH PROPOFOL  N/A 04/21/2020   Procedure: COLONOSCOPY WITH PROPOFOL ;  Surgeon: Rollin Dover, MD;  Location: [facility];  Service: Endoscopy;  Laterality: N/A;   ENDOSCOPIC RETROGRADE CHOLANGIOPANCREATOGRAPHY (ERCP) WITH PROPOFOL  N/A 06/11/2019   Procedure: ENDOSCOPIC RETROGRADE CHOLANGIOPANCREATOGRAPHY (ERCP) WITH PROPOFOL ;  Surgeon: Rollin Dover, MD;  [facility]: [facility];  Service: Endoscopy;  Laterality: N/A;   ERCP N/A 05/07/2013   Procedure: ENDOSCOPIC RETROGRADE CHOLANGIOPANCREATOGRAPHY (ERCP);  Surgeon: Dover JONETTA Rollin, MD;  Location: [facility];  Service: Endoscopy;  Laterality: N/A;  start ercp first, if canulation fails switch to EUS    ESOPHAGOGASTRODUODENOSCOPY (EGD) WITH PROPOFOL  N/A 05/15/2019   Procedure: ESOPHAGOGASTRODUODENOSCOPY (EGD) WITH PROPOFOL ;  Surgeon: Harvey Margo CROME, MD;  [facility]: [facility];  Service: Endoscopy;  Laterality: N/A;    ESOPHAGOGASTRODUODENOSCOPY (EGD) WITH PROPOFOL  N/A 03/03/2020   Procedure: ESOPHAGOGASTRODUODENOSCOPY (EGD) WITH PROPOFOL ;  Surgeon: Rollin Dover, MD;  [facility];  Service: Endoscopy;  Laterality: N/A;   ESOPHAGOGASTRODUODENOSCOPY (EGD) WITH PROPOFOL  N/A 04/21/2020   Procedure: ESOPHAGOGASTRODUODENOSCOPY (EGD) WITH PROPOFOL ;  Surgeon: Rollin Dover, MD;  [facility];  Service: Endoscopy;  Laterality: N/A;   EUS N/A 11/03/2012   Procedure: UPPER ENDOSCOPIC ULTRASOUND (EUS) LINEAR;  Surgeon: Dover JONETTA Rollin, MD;  Location: [facility];  Service: Endoscopy;  Laterality: N/A;   left arm     MASTECTOMY MODIFIED RADICAL Right 05/06/2024   Procedure: MASTECTOMY, MODIFIED RADICAL;  Surgeon: Mavis Anes, MD;  Location: [facility];  Service: General;  Laterality: Right;   NECK SURGERY     POLYPECTOMY  04/21/2020   Procedure: POLYPECTOMY;  Surgeon: Rollin Dover, MD;  [facility];  Service: Endoscopy;;   REMOVAL OF STONES  06/11/2019   Procedure: REMOVAL OF STONES;  Surgeon: Rollin Dover, MD;  [facility];  Service: Endoscopy;;   TOOTH EXTRACTION N/A 05/24/2022   Procedure: DENTAL RESTORATION/EXTRACTIONS;  Surgeon: Sheryle Hamilton, DMD;  Location: [facility];  Service: Oral Surgery;  Laterality: N/A;   TOTAL HIP ARTHROPLASTY Left 11/17/2022   Procedure: TOTAL HIP ARTHROPLASTY ANTERIOR APPROACH;  Surgeon: Beverley Evalene JONETTA, MD;  Location: [facility];  Service: Orthopedics;  Laterality: Left;   TOTAL SHOULDER ARTHROPLASTY Left 11/04/2023   Procedure: ARTHROPLASTY, SHOULDER, TOTAL;  Surgeon: Josefina Chew, MD;  Location[facility];  Service: Orthopedics;  Laterality: Left;   TUBAL LIGATION     UPPER ESOPHAGEAL ENDOSCOPIC ULTRASOUND (EUS) N/A 06/11/2019   Procedure: UPPER ESOPHAGEAL ENDOSCOPIC ULTRASOUND (EUS);  Surgeon: Rollin Dover, MD;  Location: [facility];  Service: Endoscopy;  Laterality: N/A;    FAMILY HISTORY: Family History  Problem Relation Age of Onset    Hypertension Mother    Hypertension Father    Cancer Father        prostate   Heart disease Father    Stroke Brother    Liver cancer Brother    Hypertension Brother    Breast cancer Neg Hx     SOCIAL HISTORY: Social History   Socioeconomic History   Marital status: Married    Spouse name: Not on file  Number of children: Not on file   Years of education: Not on file   Highest education level: 12th grade  Occupational History   Not on file  Tobacco Use   Smoking status: Every Day    Current packs/day: 0.50    Average packs/day: 0.5 packs/day for 20.0 years (10.0 ttl pk-yrs)    Types: Cigarettes   Smokeless tobacco: Never  Vaping Use   Vaping status: Never Used  Substance and Sexual Activity   Alcohol use: Not Currently   Drug use: No   Sexual activity: Not on file  Other Topics Concern   Not on file  Social History Narrative   Live with husband 2 dogs and 4 cats   Right handed   Caffeine- 24oz-36oz daily   Social Drivers of Health   Tobacco Use: High Risk (07/08/2024)   Patient History    Smoking Tobacco Use: Every Day    Smokeless Tobacco Use: Never    Passive Exposure: Not on file  Financial Resource Strain: Low Risk (05/07/2024)   Overall Financial Resource Strain (CARDIA)    Difficulty of Paying Living Expenses: Not very hard  Food Insecurity: No Food Insecurity (05/07/2024)   Epic    Worried About Radiation Protection Practitioner of Food in the Last Year: Never true    Ran Out of Food in the Last Year: Never true  Transportation Needs: No Transportation Needs (05/07/2024)   Epic    Lack of Transportation (Medical): No    Lack of Transportation (Non-Medical): No  Physical Activity: Insufficiently Active (05/07/2024)   Exercise Vital Sign    Days of Exercise per Week: 1 day    Minutes of Exercise per Session: 10 min  Stress: Stress Concern Present (05/07/2024)   [employer] of Occupational Health - Occupational Stress Questionnaire    Feeling of Stress: To some  extent  Social Connections: Socially Isolated (05/07/2024)   Social Connection and Isolation Panel    Frequency of Communication with Friends and Family: Never    Frequency of Social Gatherings with Friends and Family: Once a week    Attends Religious Services: Never    Database Administrator or Organizations: No    Attends Engineer, Structural: Not on file    Marital Status: Married  Catering Manager Violence: Not At Risk (05/06/2024)   Epic    Fear of Current or Ex-Partner: No    Emotionally Abused: No    Physically Abused: No    Sexually Abused: No  Depression (PHQ2-9): Low Risk (06/07/2024)   Depression (PHQ2-9)    PHQ-2 Score: 0  Recent Concern: Depression (PHQ2-9) - Medium Risk (05/10/2024)   Depression (PHQ2-9)    PHQ-2 Score: 7  Alcohol Screen: Not on file  Housing: Low Risk (05/07/2024)   Epic    Unable to Pay for Housing in the Last Year: No    Number of Times Moved in the Last Year: 0    Homeless in the Last Year: No  Utilities: Not At Risk (05/06/2024)   Epic    Threatened with loss of utilities: No  Health Literacy: Not on file      Modena Callander, M.D. Ph.D.  [facility] [address] [place], [place] [address] Ph: [phone] Fax: [phone]  CC:  Bertell Satterfield, MD [address] [place],  [place] [address]  Edman Meade PEDLAR, FNP   "

## 2024-07-21 ENCOUNTER — Ambulatory Visit

## 2024-07-21 VITALS — Ht 60.0 in | Wt 94.0 lb

## 2024-07-21 DIAGNOSIS — Z0000 Encounter for general adult medical examination without abnormal findings: Secondary | ICD-10-CM

## 2024-07-21 NOTE — Progress Notes (Signed)
 "  Chief Complaint  Patient presents with   Medicare Wellness     Subjective:   Sharon Welch is a 54 y.o. female who presents for a [employer] Visit.  Visit info / Clinical Intake: Medicare Wellness Visit Type:: Initial Annual Wellness Visit Persons participating in visit and providing information:: patient Medicare Wellness Visit Mode:: Telephone If telephone:: video error Since this visit was completed virtually, some vitals may be partially provided or unavailable. Missing vitals are due to the limitations of the virtual format.: Documented vitals are patient reported If Telephone or Video please confirm:: I connected with patient using audio/video enable telemedicine. I verified patient identity with two identifiers, discussed telehealth limitations, and patient agreed to proceed. Patient Location:: home Provider Location:: home office Interpreter Needed?: No Pre-visit prep was completed: yes AWV questionnaire completed by patient prior to visit?: no Living arrangements:: lives with spouse/significant other Patient's Overall Health Status Rating: (!) fair Typical amount of pain: (!) a lot Does pain affect daily life?: (!) yes Are you currently prescribed opioids?: (!) yes  Dietary Habits and Nutritional Risks How many meals a day?: (!) 1 Eats fruit and vegetables daily?: yes Most meals are obtained by: having others provide food In the last 2 weeks, have you had any of the following?: none Diabetic:: no  Functional Status Activities of Daily Living (to include ambulation/medication): Independent Ambulation: Independent Medication Administration: Independent Home Management (perform basic housework or laundry): Independent Manage your own finances?: yes Primary transportation is: family / friends Concerns about vision?: no *vision screening is required for [facility]* Concerns about hearing?: no  Fall Screening Falls in the past year?: 1 Number of falls in past  year: 1 Was there an injury with Fall?: 1 Fall Risk Category Calculator: 3 Patient Fall Risk Level: High Fall Risk  Fall Risk Patient at Risk for Falls Due to: History of fall(s); Orthopedic patient; Impaired balance/gait; Impaired mobility Fall risk Follow up: Falls evaluation completed; Education provided; Falls prevention discussed  Home and Transportation Safety: All rugs have non-skid backing?: yes All stairs or steps have railings?: yes Grab bars in the bathtub or shower?: yes Have non-skid surface in bathtub or shower?: yes Good home lighting?: yes Regular seat belt use?: yes Hospital stays in the last year:: (!) yes How many hospital stays:: 2 Reason: shoulder replacement, mastectomy  Cognitive Assessment Difficulty concentrating, remembering, or making decisions? : yes Will 6CIT or Mini Cog be Completed: yes What year is it?: 0 points What month is it?: 0 points Give patient an address phrase to remember (5 components): [address] [facility] [place] About what time is it?: 0 points Count backwards from 20 to 1: 0 points Say the months of the year in reverse: 0 points Repeat the address phrase from earlier: 0 points 6 CIT Score: 0 points  Advance Directives (For Healthcare) Does Patient Have a Medical Advance Directive?: No Does patient want to make changes to medical advance directive?: No - Guardian declined Type of Advance Directive: Healthcare Power of [place]; Living will Copy of Healthcare Power of Attorney in Chart?: No - copy requested Copy of Living Will in Chart?: No - copy requested Would patient like information on creating a medical advance directive?: No - Patient declined  Reviewed/Updated  Reviewed/Updated: Reviewed All (Medical, Surgical, Family, Medications, Allergies, Care Teams, Patient Goals)    Allergies (verified) Ultram [tramadol], Aleve [naproxen], Blueberry [vaccinium angustifolium], Coconut (cocos nucifera), Depakote  [divalproex  sodium],  Nsaids, and Pineapple   Current Medications (verified) Outpatient  Encounter Medications as of 07/21/2024  Medication Sig   albuterol  (VENTOLIN  HFA) 108 (90 Base) MCG/ACT inhaler Inhale 1-2 puffs into the lungs every 4 (four) hours as needed for wheezing or shortness of breath.   amitriptyline  (ELAVIL ) 25 MG tablet Take 50 mg by mouth at bedtime.   anastrozole  (ARIMIDEX ) 1 MG tablet Take 1 tablet (1 mg total) by mouth daily.   dexlansoprazole (DEXILANT) 60 MG capsule Take 60 mg by mouth every morning.   diltiazem  (CARDIZEM  CD) 180 MG 24 hr capsule TAKE ONE CAPSULE BY MOUTH EVERY DAY   HYDROcodone -acetaminophen  (NORCO) 10-325 MG tablet Take 1 tablet by mouth every 6 (six) hours as needed for severe pain (pain score 7-10).   lamoTRIgine  (LAMICTAL ) 200 MG tablet Take 1 tablet (200 mg total) by mouth 2 (two) times daily.   linaclotide  (LINZESS ) 145 MCG CAPS capsule Take 145 mcg by mouth daily before breakfast.   meclizine  (ANTIVERT ) 25 MG tablet Take 1 tablet (25 mg total) by mouth 3 (three) times daily.   methocarbamol  (ROBAXIN ) 500 MG tablet TAKE 1 TABLET BY MOUTH 3 TIMES A DAILY.   Milnacipran  (SAVELLA ) 50 MG TABS tablet Take 50 mg by mouth 2 (two) times daily.   promethazine  (PHENERGAN ) 25 MG tablet Take 25 mg by mouth every 6 (six) hours as needed for nausea or vomiting.   QULIPTA  60 MG TABS Take 1 tablet (60 mg total) by mouth daily.   rizatriptan (MAXALT) 10 MG tablet TAKE ONE TABLET BY MOUTH EVERY DAY.   ALPRAZolam  (XANAX ) 0.25 MG tablet Take 1-2 tablets (0.25-0.5 mg total) by mouth 4 (four) times daily as needed for anxiety or sleep. (Patient not taking: Reported on 07/21/2024)   furosemide  (LASIX ) 20 MG tablet Take 1 tablet (20 mg total) by mouth daily as needed (increasing lower extremity edema). (Patient not taking: Reported on 07/21/2024)   Vitamin D , Ergocalciferol , (DRISDOL ) 1.25 MG (50000 UNIT) CAPS capsule Take 1 capsule (50,000 Units total) by mouth every 7 (seven) days. (Patient not  taking: Reported on 07/21/2024)   No facility-administered encounter medications on file as of 07/21/2024.    History: Past Medical History:  Diagnosis Date   Allergy    Anxiety    Arthritis    Asthma    seasonal   Bruising, spontaneous 12/08/2013   Cancer (HCC)    cervical cancer - had hysterectomy   Chronic back pain    Chronic back pain    DDD (degenerative disc disease), lumbar    DDD (degenerative disc disease), lumbar    Dysrhythmia    tachycardia   Fibromyalgia    GERD (gastroesophageal reflux disease)    Headache(784.0)    migraines   History of kidney stones    Ovarian cyst    Partial tear subscapularis tendon 12/27/2011   Scoliosis    Seizures (HCC)    started 9/12-Dx 15 yrs ago-unknow origin and on meds   Past Surgical History:  Procedure Laterality Date   ABDOMINAL AORTIC ANEURYSM REPAIR     ABDOMINAL HYSTERECTOMY  06/24/2010   BACK SURGERY  06/25/1987   scoliosis throsic-[facility]   BREAST BIOPSY Right 02/25/2024   MM RT BREAST BX W LOC DEV 1ST LESION IMAGE BX SPEC STEREO GUIDE 02/25/2024 [facility]   BREAST BIOPSY Right 02/25/2024   MM RT BREAST BX W LOC DEV EA AD LESION IMG BX SPEC STEREO GUIDE 02/25/2024 [facility]   CESAREAN SECTION     CHOLECYSTECTOMY N/A 11/17/2012   Procedure: LAPAROSCOPIC CHOLECYSTECTOMY WITH INTRAOPERATIVE  CHOLANGIOGRAM;  Surgeon: Lynda Leos, MD;  Location: [facility];  Service: General;  Laterality: N/A;   COLONOSCOPY WITH PROPOFOL  N/A 04/21/2020   Procedure: COLONOSCOPY WITH PROPOFOL ;  Surgeon: Rollin Dover, MD;  Location: [facility];  Service: Endoscopy;  Laterality: N/A;   ENDOSCOPIC RETROGRADE CHOLANGIOPANCREATOGRAPHY (ERCP) WITH PROPOFOL  N/A 06/11/2019   Procedure: ENDOSCOPIC RETROGRADE CHOLANGIOPANCREATOGRAPHY (ERCP) WITH PROPOFOL ;  Surgeon: Rollin Dover, MD;  Location: [facility];  Service: Endoscopy;  Laterality: N/A;   ERCP N/A 05/07/2013   Procedure: ENDOSCOPIC RETROGRADE CHOLANGIOPANCREATOGRAPHY (ERCP);   Surgeon: Dover JONETTA Rollin, MD;  Location: [facility];  Service: Endoscopy;  Laterality: N/A;  start ercp first, if canulation fails switch to EUS    ESOPHAGOGASTRODUODENOSCOPY (EGD) WITH PROPOFOL  N/A 05/15/2019   Procedure: ESOPHAGOGASTRODUODENOSCOPY (EGD) WITH PROPOFOL ;  Surgeon: Harvey Margo CROME, MD;  Location: [facility];  Service: Endoscopy;  Laterality: N/A;   ESOPHAGOGASTRODUODENOSCOPY (EGD) WITH PROPOFOL  N/A 03/03/2020   Procedure: ESOPHAGOGASTRODUODENOSCOPY (EGD) WITH PROPOFOL ;  Surgeon: Rollin Dover, MD;  Location: [facility];  Service: Endoscopy;  Laterality: N/A;   ESOPHAGOGASTRODUODENOSCOPY (EGD) WITH PROPOFOL  N/A 04/21/2020   Procedure: ESOPHAGOGASTRODUODENOSCOPY (EGD) WITH PROPOFOL ;  Surgeon: Rollin Dover, MD;  Location: [facility];  Service: Endoscopy;  Laterality: N/A;   EUS N/A 11/03/2012   Procedure: UPPER ENDOSCOPIC ULTRASOUND (EUS) LINEAR;  Surgeon: Dover JONETTA Rollin, MD;  Location: [facility];  Service: Endoscopy;  Laterality: N/A;   JOINT REPLACEMENT  10/2022   10/2023   left arm     MASTECTOMY MODIFIED RADICAL Right 05/06/2024   Procedure: MASTECTOMY, MODIFIED RADICAL;  Surgeon: Mavis Anes, MD;  Location: [facility];  Service: General;  Laterality: Right;   NECK SURGERY     POLYPECTOMY  04/21/2020   Procedure: POLYPECTOMY;  Surgeon: Rollin Dover, MD;  Location: [facility];  Service: Endoscopy;;   REMOVAL OF STONES  06/11/2019   Procedure: REMOVAL OF STONES;  Surgeon: Rollin Dover, MD;  Location: [facility];  Service: Endoscopy;;   SPINE SURGERY  3/89   TOOTH EXTRACTION N/A 05/24/2022   Procedure: DENTAL RESTORATION/EXTRACTIONS;  Surgeon: Sheryle Hamilton, DMD;  Location: [facility];  Service: Oral Surgery;  Laterality: N/A;   TOTAL HIP ARTHROPLASTY Left 11/17/2022   Procedure: TOTAL HIP ARTHROPLASTY ANTERIOR APPROACH;  Surgeon: Beverley Evalene JONETTA, MD;  Location: [facility] OR;  Service: Orthopedics;  Laterality: Left;   TOTAL SHOULDER ARTHROPLASTY Left 11/04/2023   Procedure:  ARTHROPLASTY, SHOULDER, TOTAL;  Surgeon: Josefina Chew, MD;  Location: [facility];  Service: Orthopedics;  Laterality: Left;   TUBAL LIGATION     UPPER ESOPHAGEAL ENDOSCOPIC ULTRASOUND (EUS) N/A 06/11/2019   Procedure: UPPER ESOPHAGEAL ENDOSCOPIC ULTRASOUND (EUS);  Surgeon: Rollin Dover, MD;  Location: [facility];  Service: Endoscopy;  Laterality: N/A;   Family History  Problem Relation Age of Onset   Hypertension Mother    Asthma Mother    Hypertension Father    Cancer Father        prostate   Heart disease Father    Arthritis Father    Stroke Brother    Liver cancer Brother    Hypertension Brother    Hypertension Brother    Cancer Maternal Aunt    Cancer Paternal Aunt    Breast cancer Neg Hx    Social History   Occupational History   Not on file  Tobacco Use   Smoking status: Every Day    Current packs/day: 0.50    Average packs/day: 0.5 packs/day for 20.0 years (10.0 ttl pk-yrs)    Types: Cigarettes  Smokeless tobacco: Never  Vaping Use   Vaping status: Never Used  Substance and Sexual Activity   Alcohol use: Not Currently   Drug use: No   Sexual activity: Not on file   Tobacco Counseling Ready to quit: No Counseling given: Yes  SDOH Screenings   Food Insecurity: No Food Insecurity (07/21/2024)  Housing: Low Risk (07/21/2024)  Transportation Needs: No Transportation Needs (07/21/2024)  Utilities: Not At Risk (07/21/2024)  Depression (PHQ2-9): High Risk (07/21/2024)  Financial Resource Strain: Low Risk (05/07/2024)  Physical Activity: Inactive (07/21/2024)  Social Connections: Unknown (07/21/2024)  Recent Concern: Social Connections - Socially Isolated (05/07/2024)  Stress: No Stress Concern Present (07/21/2024)  Recent Concern: Stress - Stress Concern Present (05/07/2024)  Tobacco Use: High Risk (07/21/2024)  Health Literacy: Adequate Health Literacy (07/21/2024)   See flowsheets for full screening details  Depression Screen PHQ 2 & 9 Depression Scale- Over the  past 2 weeks, how often have you been bothered by any of the following problems? Little interest or pleasure in doing things: 0 Feeling down, depressed, or hopeless (PHQ Adolescent also includes...irritable): 1 PHQ-2 Total Score: 1 Trouble falling or staying asleep, or sleeping too much: 3 Feeling tired or having little energy: 3 Poor appetite or overeating (PHQ Adolescent also includes...weight loss): 3 Feeling bad about yourself - or that you are a failure or have let yourself or your family down: 0 Trouble concentrating on things, such as reading the newspaper or watching television (PHQ Adolescent also includes...like school work): 1 Moving or speaking so slowly that other people could have noticed. Or the opposite - being so fidgety or restless that you have been moving around a lot more than usual: 0 Thoughts that you would be better off dead, or of hurting yourself in some way: 0 PHQ-9 Total Score: 11 If you checked off any problems, how difficult have these problems made it for you to do your work, take care of things at home, or get along with other people?: Somewhat difficult  Depression Treatment Depression Interventions/Treatment : Patient refuses Treatment     Goals Addressed               This Visit's Progress     Stay healthy and try to stay out of the hospital (pt-stated)               Objective:    Today's Vitals   07/21/24 1350  Weight: 94 lb (42.6 kg)  Height: 5' (1.524 m)   Body mass index is 18.36 kg/m.  Hearing/Vision screen Hearing Screening - Comments:: Patient denies any hearing difficulties.   Vision Screening - Comments:: Patient does not have an eye doctor. A list of eye doctors has been provided to the patient.   Immunizations and Health Maintenance Health Maintenance  Topic Date Due   Medicare Annual Wellness (AWV)  Never done   COVID-19 Vaccine (1) Never done   DTaP/Tdap/Td (1 - Tdap) Never done   Pneumococcal Vaccine: 50+ Years (1  of 2 - PCV) Never done   Hepatitis B Vaccines 19-59 Average Risk (1 of 3 - 19+ 3-dose series) Never done   Zoster Vaccines- Shingrix (1 of 2) Never done   Influenza Vaccine  07/30/2024 (Originally 01/23/2024)   Mammogram  01/25/2025   Bone Density Scan  06/14/2026   Colonoscopy  04/21/2030   HPV VACCINES (No Doses Required) Completed   Hepatitis C Screening  Completed   HIV Screening  Completed   Meningococcal B Vaccine  Aged  Out        Assessment/Plan:  This is a routine wellness examination for Sharon Welch.  Patient Care Team: Bacchus, Meade PEDLAR, FNP as PCP - General (Family Medicine) Davonna Siad, MD as Medical Oncologist (Medical Oncology) Celestia Joesph SQUIBB, RN as Oncology Nurse Navigator (Medical Oncology) Stacia Diannah SQUIBB, MD as Consulting Physician (Cardiology) Onita Duos, MD as Consulting Physician (Neurology) Rollin Dover, MD as Consulting Physician (Gastroenterology)  I have personally reviewed and noted the following in the patients chart:   Medical and social history Use of alcohol, tobacco or illicit drugs  Current medications and supplements including opioid prescriptions. Functional ability and status Nutritional status Physical activity Advanced directives List of other physicians Hospitalizations, surgeries, and ER visits in previous 12 months Vitals Screenings to include cognitive, depression, and falls Referrals and appointments  No orders of the defined types were placed in this encounter.  In addition, I have reviewed and discussed with patient certain preventive protocols, quality metrics, and best practice recommendations. A written personalized care plan for preventive services as well as general preventive health recommendations were provided to patient.   Sharon Welch, CMA   07/21/2024   Return July 25, 2025 at 9:20 am, for In office Medicare Well Visit w  Wellness Nurse.  After Visit Summary: (MyChart) Due to this being a telephonic visit,  the after visit summary with patients personalized plan was offered to patient via MyChart    "

## 2024-07-21 NOTE — Patient Instructions (Signed)
 [name],  Thank you for taking the time for your Medicare Wellness Visit. I appreciate your continued commitment to your health goals. Please review the care plan we discussed, and feel free to reach out if I can assist you further.  Please note that Annual Wellness Visits do not include a physical exam. Some assessments may be limited, especially if the visit was conducted virtually. If needed, we may recommend an in-person follow-up with your provider.  Ongoing Care Seeing your primary care provider every 3 to 6 months helps us  monitor your health and provide consistent, personalized care.   Referrals If a referral was made during today's visit and you haven't received any updates within two weeks, please contact the referred provider directly to check on the status.  Recommended Screenings:  Health Maintenance  Topic Date Due   Medicare Annual Wellness Visit  Never done   COVID-19 Vaccine (1) Never done   DTaP/Tdap/Td vaccine (1 - Tdap) Never done   Pneumococcal Vaccine for age over 42 (1 of 2 - PCV) Never done   Hepatitis B Vaccine (1 of 3 - 19+ 3-dose series) Never done   Zoster (Shingles) Vaccine (1 of 2) Never done   Flu Shot  07/30/2024*   Breast Cancer Screening  01/25/2025   Osteoporosis screening with Bone Density Scan  06/14/2026   Colon Cancer Screening  04/21/2030   HPV Vaccine (No Doses Required) Completed   Hepatitis C Screening  Completed   HIV Screening  Completed   Meningitis B Vaccine  Aged Out  *Topic was postponed. The date shown is not the original due date.       07/21/2024    2:02 PM  Advanced Directives  Does Patient Have a Medical Advance Directive? No  Would patient like information on creating a medical advance directive? No - Patient declined    Vision: Annual vision screenings are recommended for early detection of glaucoma, cataracts, and diabetic retinopathy. These exams can also reveal signs of chronic conditions such as diabetes and high  blood pressure.  Dental: Annual dental screenings help detect early signs of oral cancer, gum disease, and other conditions linked to overall health, including heart disease and diabetes.  Please see the attached documents for additional preventive care recommendations.

## 2024-07-22 ENCOUNTER — Other Ambulatory Visit: Payer: Self-pay | Admitting: Adult Health

## 2024-09-06 ENCOUNTER — Inpatient Hospital Stay: Attending: Oncology

## 2024-09-13 ENCOUNTER — Inpatient Hospital Stay: Admitting: Oncology

## 2024-10-08 ENCOUNTER — Ambulatory Visit: Payer: Self-pay | Admitting: Family Medicine

## 2025-01-06 ENCOUNTER — Ambulatory Visit: Admitting: Neurology

## 2025-07-25 ENCOUNTER — Ambulatory Visit: Payer: Self-pay
